# Patient Record
Sex: Female | Born: 1975 | Hispanic: Yes | Marital: Married | Smoking: Never smoker
Health system: Southern US, Community
[De-identification: ages and names within clinical notes are randomized; demographics above are authoritative.]

## PROBLEM LIST (undated history)

## (undated) DIAGNOSIS — M1990 Unspecified osteoarthritis, unspecified site: Secondary | ICD-10-CM

## (undated) DIAGNOSIS — Z8709 Personal history of other diseases of the respiratory system: Secondary | ICD-10-CM

## (undated) DIAGNOSIS — D649 Anemia, unspecified: Secondary | ICD-10-CM

## (undated) DIAGNOSIS — I10 Essential (primary) hypertension: Secondary | ICD-10-CM

## (undated) DIAGNOSIS — E119 Type 2 diabetes mellitus without complications: Secondary | ICD-10-CM

## (undated) DIAGNOSIS — F32A Depression, unspecified: Secondary | ICD-10-CM

## (undated) DIAGNOSIS — J189 Pneumonia, unspecified organism: Secondary | ICD-10-CM

## (undated) DIAGNOSIS — M7542 Impingement syndrome of left shoulder: Secondary | ICD-10-CM

## (undated) DIAGNOSIS — E785 Hyperlipidemia, unspecified: Secondary | ICD-10-CM

## (undated) DIAGNOSIS — N809 Endometriosis, unspecified: Secondary | ICD-10-CM

## (undated) DIAGNOSIS — G5600 Carpal tunnel syndrome, unspecified upper limb: Secondary | ICD-10-CM

## (undated) DIAGNOSIS — F419 Anxiety disorder, unspecified: Secondary | ICD-10-CM

## (undated) DIAGNOSIS — R112 Nausea with vomiting, unspecified: Secondary | ICD-10-CM

## (undated) DIAGNOSIS — R0600 Dyspnea, unspecified: Secondary | ICD-10-CM

## (undated) DIAGNOSIS — G43909 Migraine, unspecified, not intractable, without status migrainosus: Secondary | ICD-10-CM

## (undated) DIAGNOSIS — J45909 Unspecified asthma, uncomplicated: Secondary | ICD-10-CM

## (undated) DIAGNOSIS — Z9889 Other specified postprocedural states: Secondary | ICD-10-CM

## (undated) DIAGNOSIS — Z9289 Personal history of other medical treatment: Secondary | ICD-10-CM

## (undated) HISTORY — DX: Anxiety disorder, unspecified: F41.9

## (undated) HISTORY — DX: Depression, unspecified: F32.A

## (undated) HISTORY — PX: APPENDECTOMY: SHX54

## (undated) HISTORY — DX: Type 2 diabetes mellitus without complications: E11.9

## (undated) HISTORY — DX: Hyperlipidemia, unspecified: E78.5

## (undated) HISTORY — DX: Carpal tunnel syndrome, unspecified upper limb: G56.00

## (undated) HISTORY — DX: Unspecified osteoarthritis, unspecified site: M19.90

## (undated) HISTORY — PX: OTHER SURGICAL HISTORY: SHX169

## (undated) HISTORY — DX: Anemia, unspecified: D64.9

## (undated) HISTORY — PX: TONSILLECTOMY: SUR1361

## (undated) HISTORY — DX: Essential (primary) hypertension: I10

## (undated) HISTORY — DX: Impingement syndrome of left shoulder: M75.42

---

## 1997-06-08 DIAGNOSIS — Z9289 Personal history of other medical treatment: Secondary | ICD-10-CM

## 1997-06-08 HISTORY — DX: Personal history of other medical treatment: Z92.89

## 1997-07-19 ENCOUNTER — Ambulatory Visit (HOSPITAL_COMMUNITY): Admission: RE | Admit: 1997-07-19 | Discharge: 1997-07-19 | Payer: Self-pay | Admitting: Obstetrics and Gynecology

## 1997-09-04 ENCOUNTER — Ambulatory Visit (HOSPITAL_COMMUNITY): Admission: RE | Admit: 1997-09-04 | Discharge: 1997-09-04 | Payer: Self-pay | Admitting: Obstetrics and Gynecology

## 1997-10-29 ENCOUNTER — Other Ambulatory Visit: Admission: RE | Admit: 1997-10-29 | Discharge: 1997-10-29 | Payer: Self-pay | Admitting: Internal Medicine

## 1997-12-03 ENCOUNTER — Inpatient Hospital Stay (HOSPITAL_COMMUNITY): Admission: AD | Admit: 1997-12-03 | Discharge: 1997-12-03 | Payer: Self-pay | Admitting: Obstetrics & Gynecology

## 1997-12-05 ENCOUNTER — Inpatient Hospital Stay (HOSPITAL_COMMUNITY): Admission: AD | Admit: 1997-12-05 | Discharge: 1997-12-05 | Payer: Self-pay | Admitting: Obstetrics

## 1997-12-20 ENCOUNTER — Other Ambulatory Visit: Admission: RE | Admit: 1997-12-20 | Discharge: 1997-12-20 | Payer: Self-pay | Admitting: *Deleted

## 1998-01-24 ENCOUNTER — Inpatient Hospital Stay (HOSPITAL_COMMUNITY): Admission: AD | Admit: 1998-01-24 | Discharge: 1998-01-24 | Payer: Self-pay | Admitting: Obstetrics and Gynecology

## 1998-01-26 ENCOUNTER — Inpatient Hospital Stay (HOSPITAL_COMMUNITY): Admission: AD | Admit: 1998-01-26 | Discharge: 1998-01-31 | Payer: Self-pay | Admitting: Obstetrics

## 1998-01-28 ENCOUNTER — Encounter: Payer: Self-pay | Admitting: Obstetrics

## 1998-02-26 ENCOUNTER — Encounter: Admission: RE | Admit: 1998-02-26 | Discharge: 1998-02-26 | Payer: Self-pay | Admitting: Family Medicine

## 1998-03-08 ENCOUNTER — Encounter: Admission: RE | Admit: 1998-03-08 | Discharge: 1998-03-08 | Payer: Self-pay | Admitting: Family Medicine

## 1998-11-25 ENCOUNTER — Emergency Department (HOSPITAL_COMMUNITY): Admission: EM | Admit: 1998-11-25 | Discharge: 1998-11-25 | Payer: Self-pay | Admitting: Emergency Medicine

## 2001-12-04 ENCOUNTER — Emergency Department (HOSPITAL_COMMUNITY): Admission: EM | Admit: 2001-12-04 | Discharge: 2001-12-04 | Payer: Self-pay | Admitting: Emergency Medicine

## 2003-07-11 ENCOUNTER — Emergency Department (HOSPITAL_COMMUNITY): Admission: EM | Admit: 2003-07-11 | Discharge: 2003-07-11 | Payer: Self-pay | Admitting: Emergency Medicine

## 2003-08-23 ENCOUNTER — Emergency Department (HOSPITAL_COMMUNITY): Admission: EM | Admit: 2003-08-23 | Discharge: 2003-08-24 | Payer: Self-pay | Admitting: Emergency Medicine

## 2003-08-23 IMAGING — CR DG CHEST 2V
2 series · 2 of 2 positions shown · non-contrast
Comparison: none

CLINICAL DATA: Chest pain.
 CHEST, TWO VIEWS 
 Two views of the chest without prior studies for comparison demonstrate cardiac silhouette, mediastinal and hilar contours to be within normal limits.  There is peribronchial thickening and increased interstitial markings which may reflect bronchitis.  No focal infiltrates.  Bony structures are intact. 
 IMPRESSION
 Findings suggest bronchitis.  No focal infiltrates.

[view not recorded (1 of 2)]
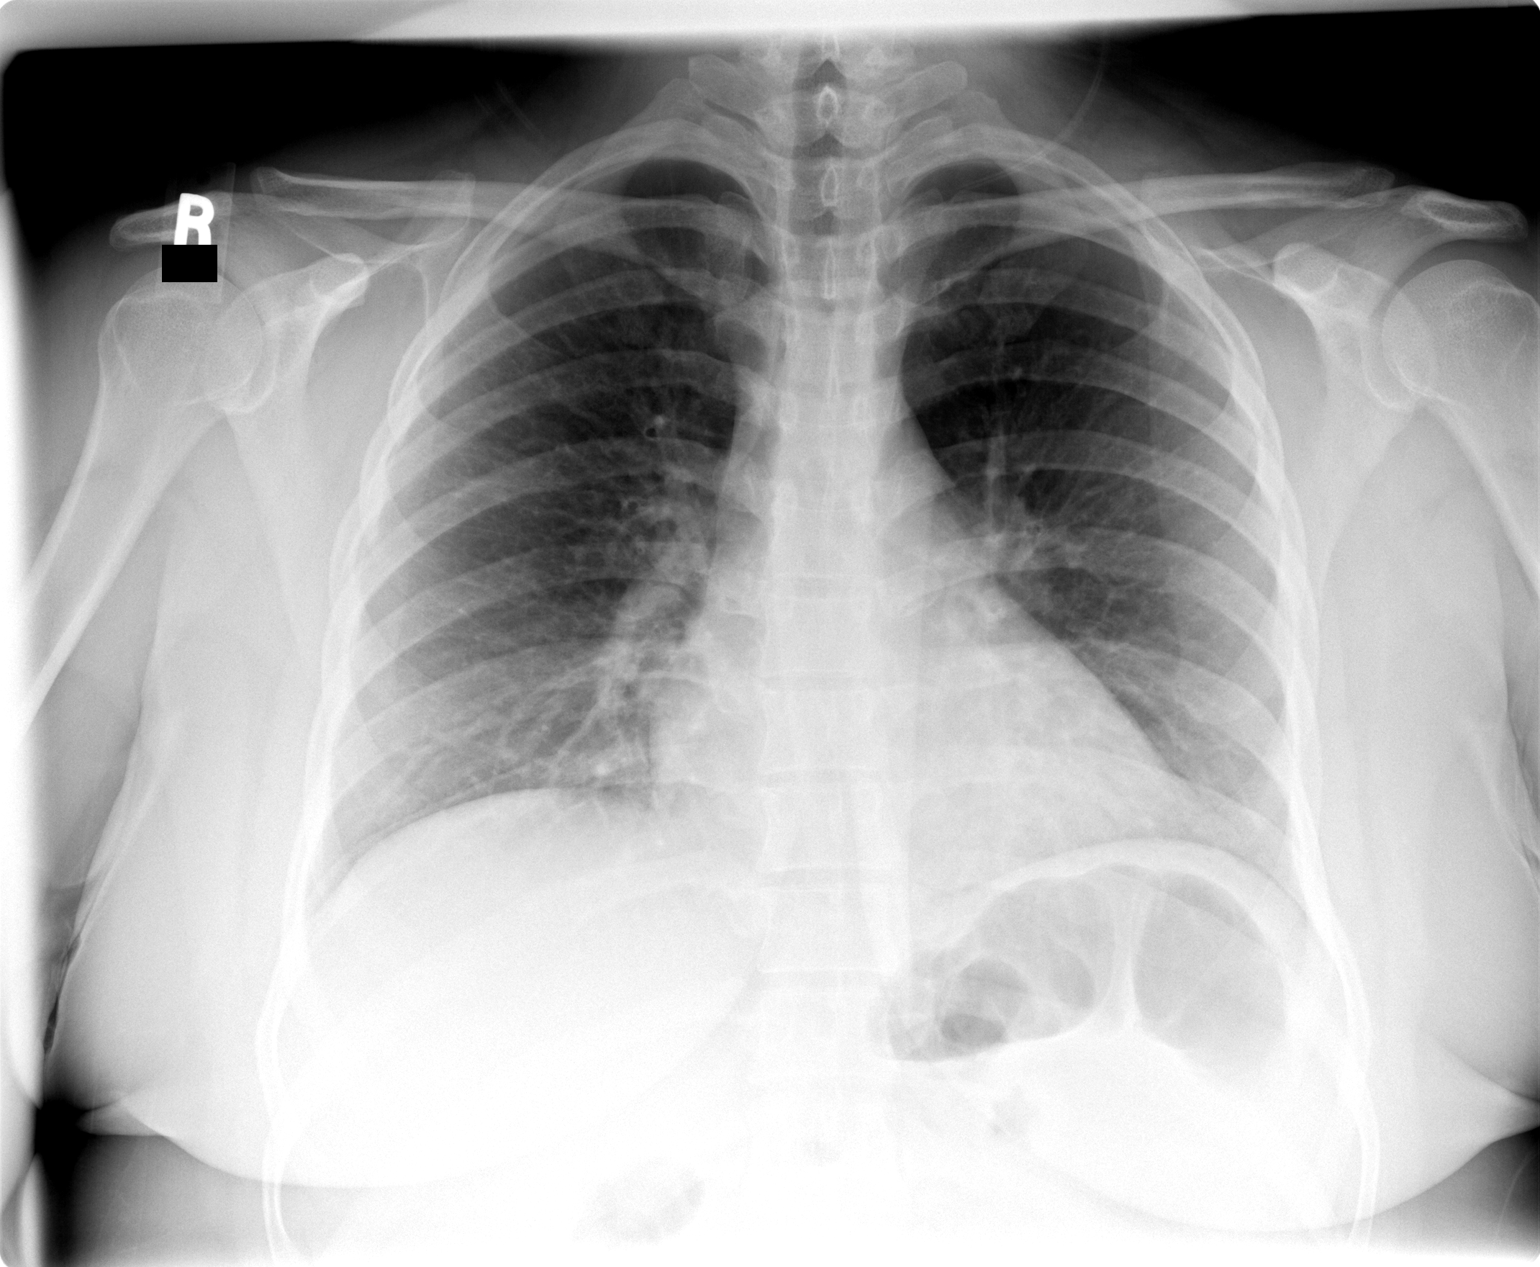

[view not recorded (2 of 2)]
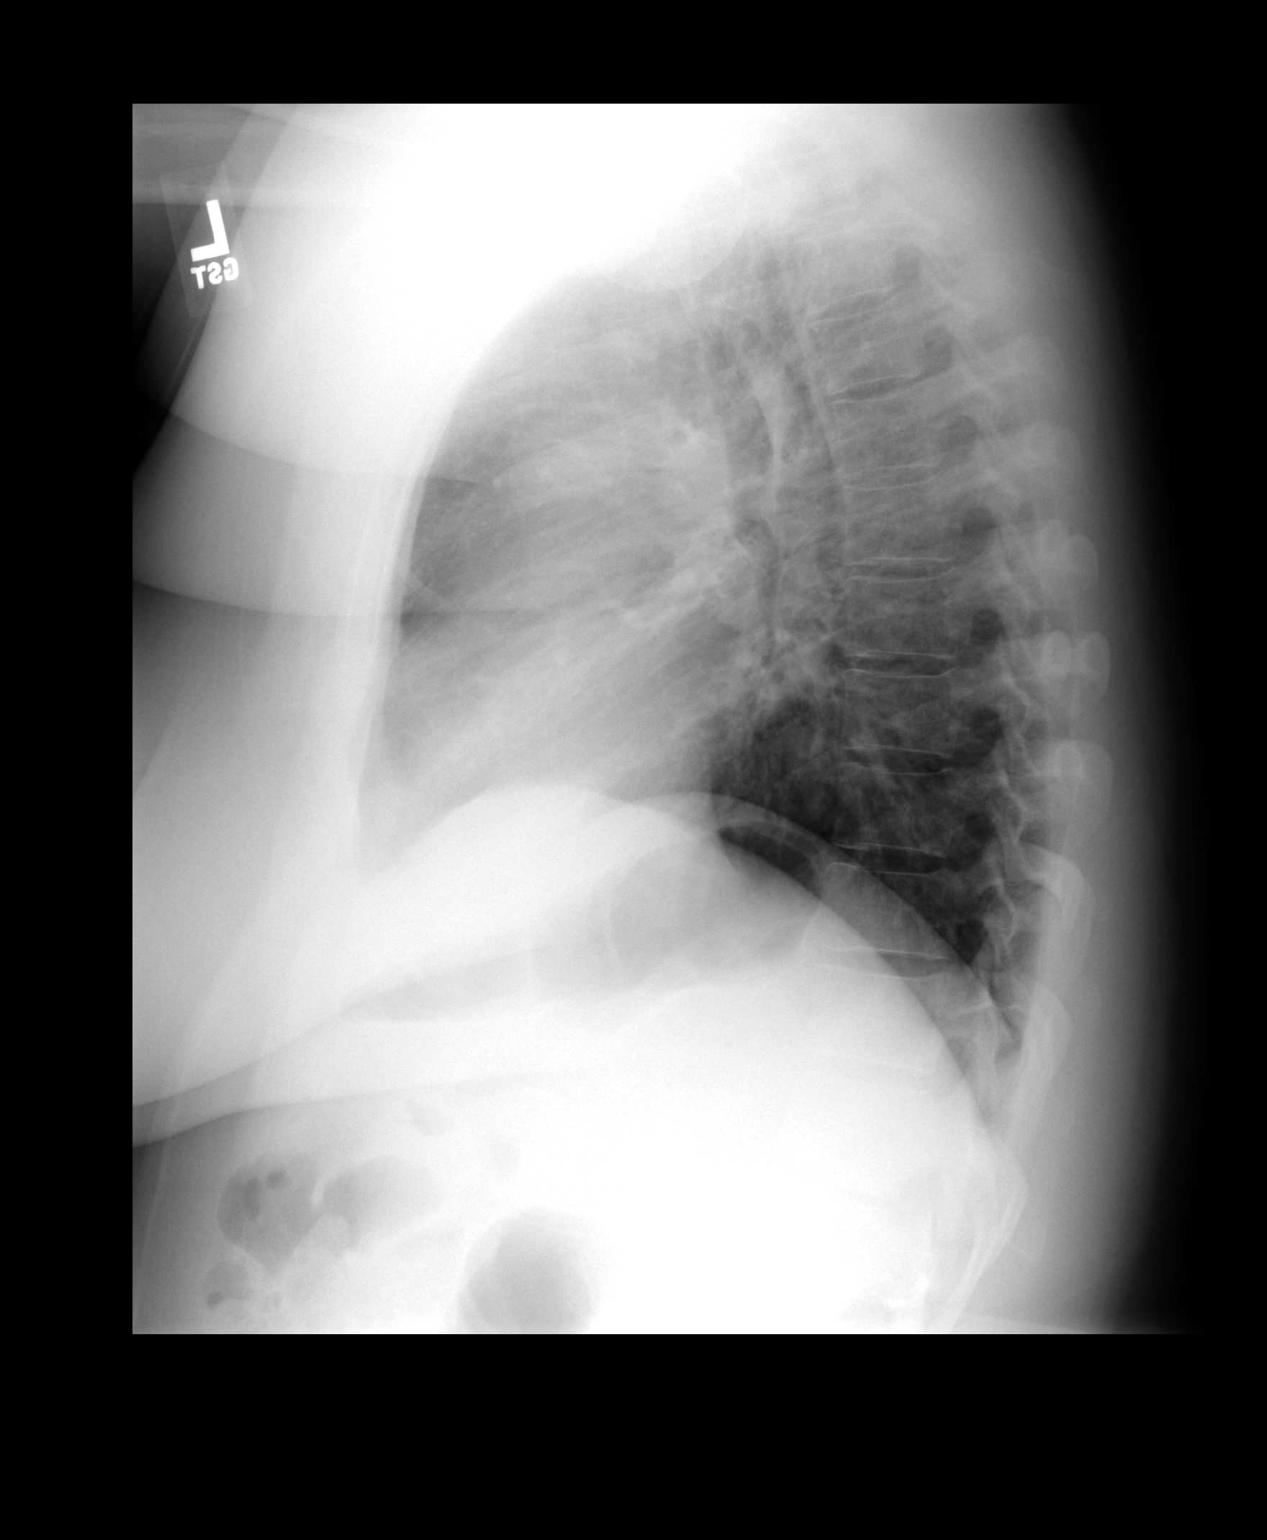

[2 of 2 positions shown; findings below may reference images not displayed]

## 2004-07-17 ENCOUNTER — Ambulatory Visit: Payer: Self-pay | Admitting: Family Medicine

## 2004-08-23 IMAGING — CR DG HAND COMPLETE 3+V*R*
3 series · 3 of 3 positions shown · non-contrast
Comparison: none

CLINICAL DATA: Status post fall three weeks ago; now with pain in fifth digit of right hand.
 RIGHT HAND ? 3 VIEWS:

[x hand pa right]
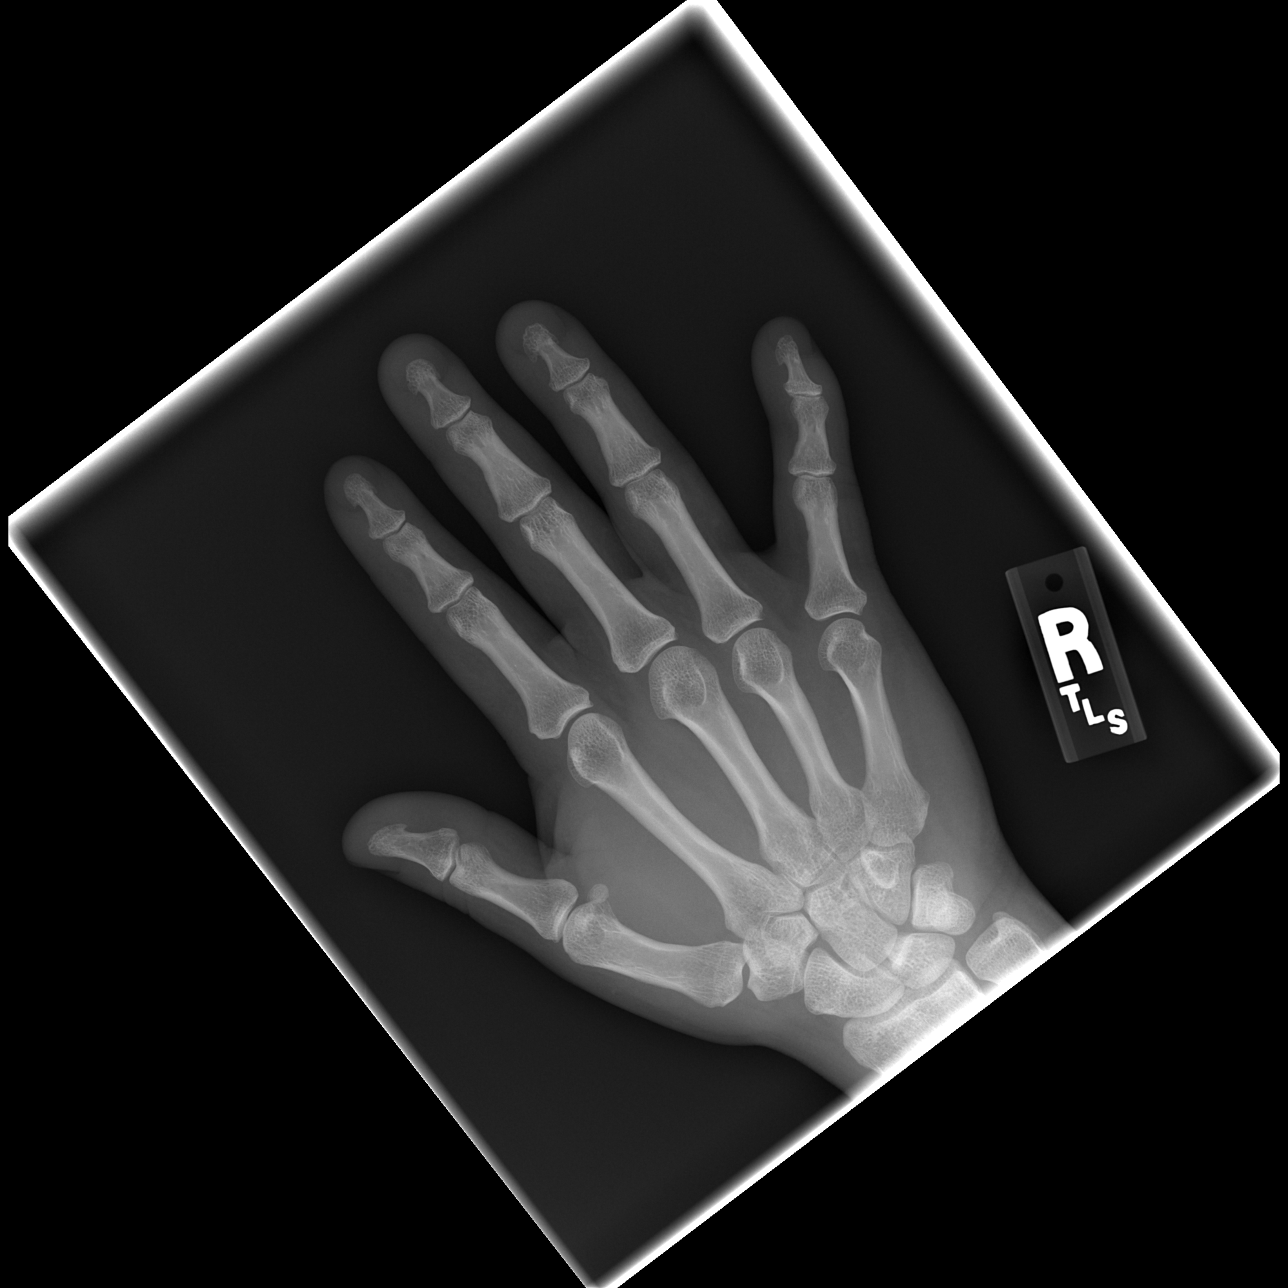

[x hand oblique right]
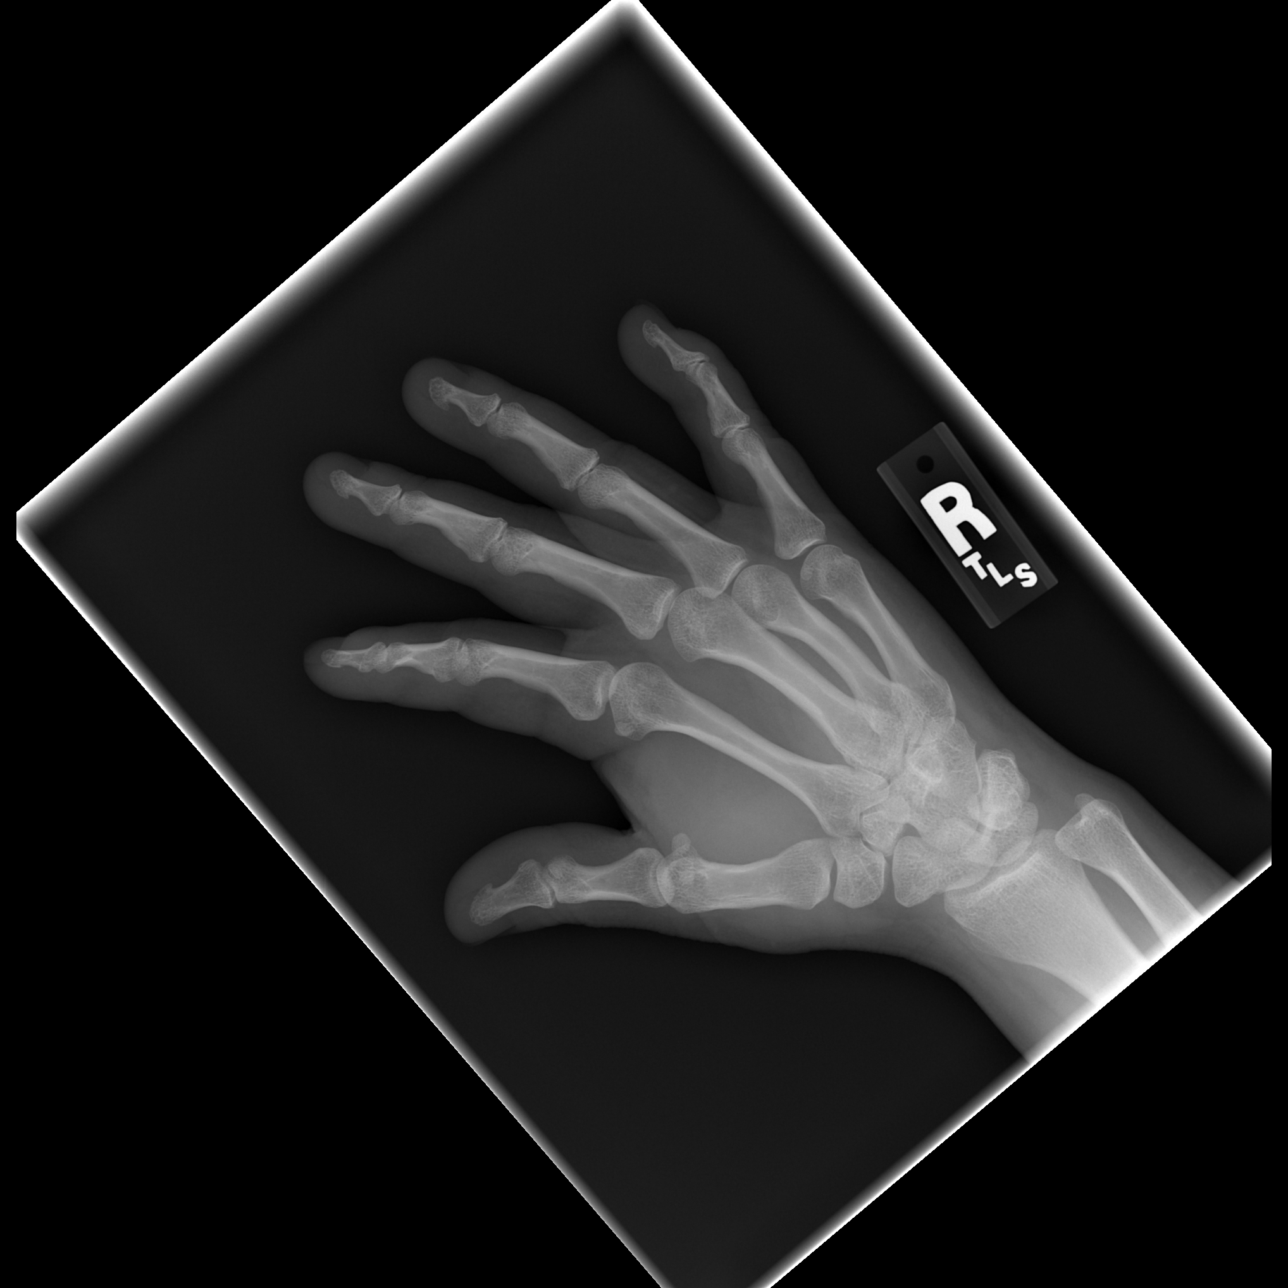

[x hand lat right]
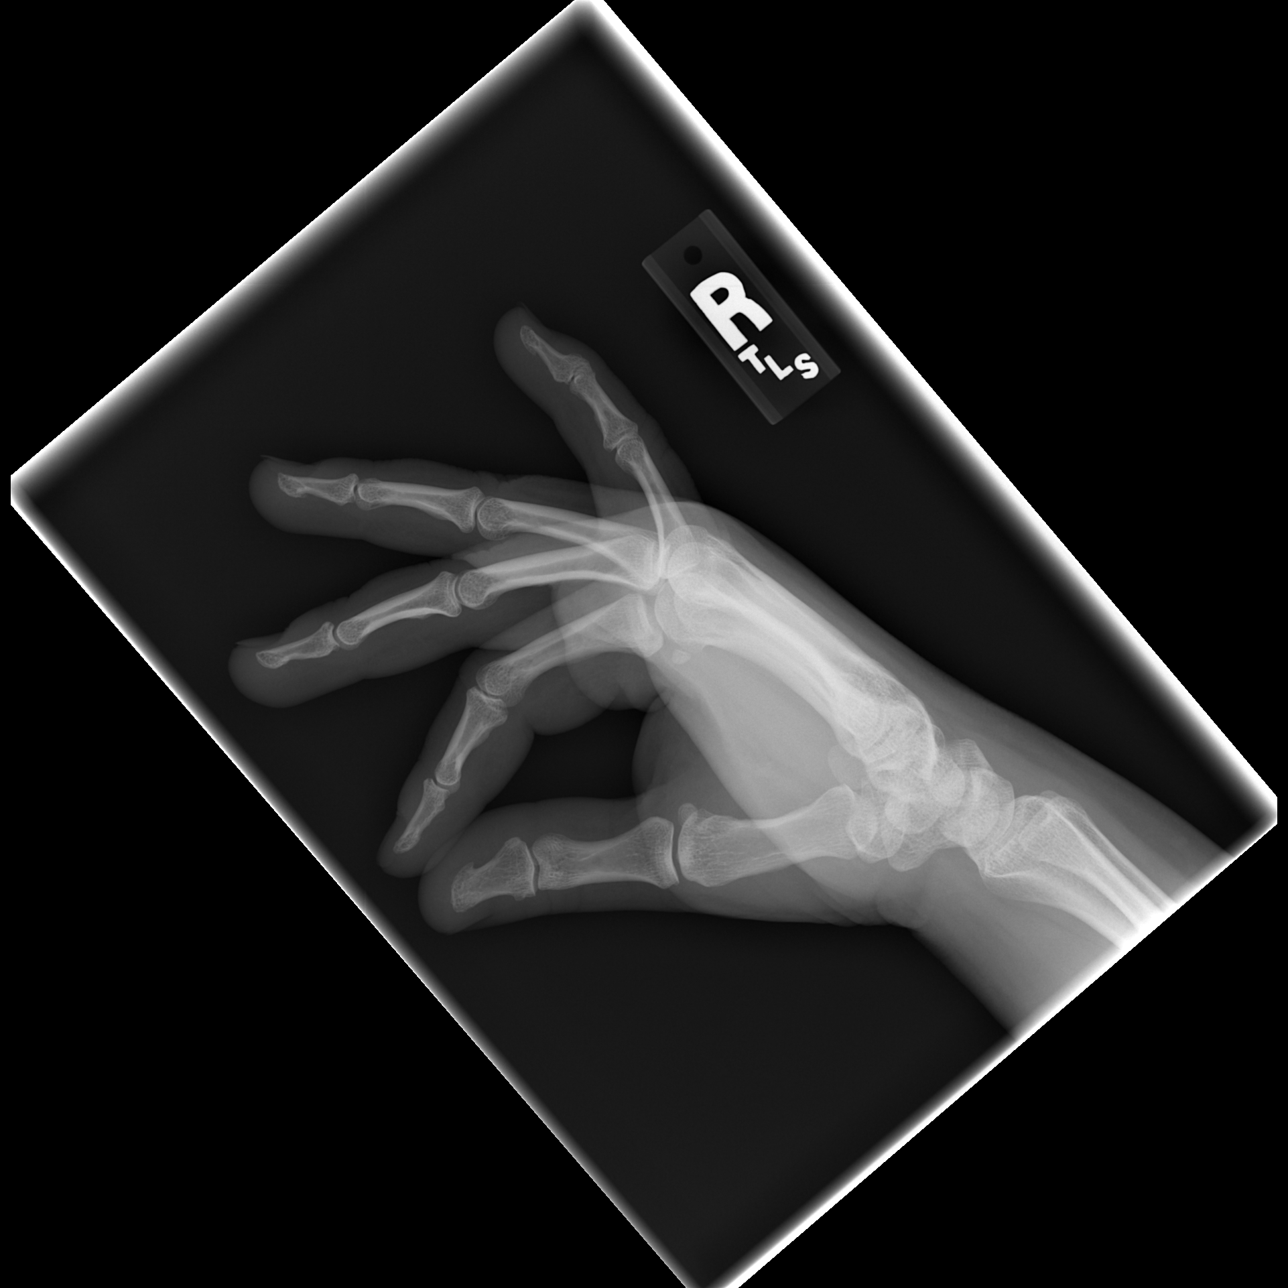

[3 of 3 positions shown; findings below may reference images not displayed]

FINDINGS: No acute radiographic abnormalities are noted.  Specifically, I see no fractures or dislocations.
IMPRESSION: No acute findings.

## 2004-09-11 ENCOUNTER — Encounter (INDEPENDENT_AMBULATORY_CARE_PROVIDER_SITE_OTHER): Payer: Self-pay | Admitting: *Deleted

## 2004-09-11 LAB — CONVERTED CEMR LAB

## 2004-09-16 ENCOUNTER — Ambulatory Visit: Payer: Self-pay | Admitting: Family Medicine

## 2004-10-24 ENCOUNTER — Ambulatory Visit: Payer: Self-pay | Admitting: Family Medicine

## 2004-10-28 ENCOUNTER — Ambulatory Visit: Payer: Self-pay | Admitting: Family Medicine

## 2004-10-30 ENCOUNTER — Ambulatory Visit: Payer: Self-pay | Admitting: Family Medicine

## 2004-11-10 ENCOUNTER — Ambulatory Visit: Payer: Self-pay | Admitting: Family Medicine

## 2004-11-18 ENCOUNTER — Ambulatory Visit: Payer: Self-pay | Admitting: Sports Medicine

## 2004-12-01 ENCOUNTER — Encounter: Admission: RE | Admit: 2004-12-01 | Discharge: 2005-03-01 | Payer: Self-pay | Admitting: Family Medicine

## 2004-12-05 ENCOUNTER — Ambulatory Visit: Payer: Self-pay | Admitting: *Deleted

## 2004-12-30 ENCOUNTER — Ambulatory Visit: Payer: Self-pay | Admitting: Family Medicine

## 2005-01-13 ENCOUNTER — Ambulatory Visit (HOSPITAL_COMMUNITY): Admission: RE | Admit: 2005-01-13 | Discharge: 2005-01-13 | Payer: Self-pay | Admitting: Family Medicine

## 2005-01-22 ENCOUNTER — Ambulatory Visit: Payer: Self-pay | Admitting: Family Medicine

## 2005-02-05 ENCOUNTER — Ambulatory Visit: Payer: Self-pay | Admitting: Family Medicine

## 2005-03-26 ENCOUNTER — Encounter: Admission: RE | Admit: 2005-03-26 | Discharge: 2005-03-26 | Payer: Self-pay | Admitting: Sports Medicine

## 2005-03-26 IMAGING — CR DG CHEST 2V
3 series · 3 of 3 positions shown · non-contrast
Comparison: none

CLINICAL DATA: Cough, congestion, fever.
 CHEST X-RAY:
 Two views of the chest are compared to a chest x-ray of [DATE].  No pneumonia is seen, however, there are prominent perihilar markings with peribronchial thickening consistent with bronchitis.  The heart is within upper limits of normal.

[view not recorded (1 of 3)]
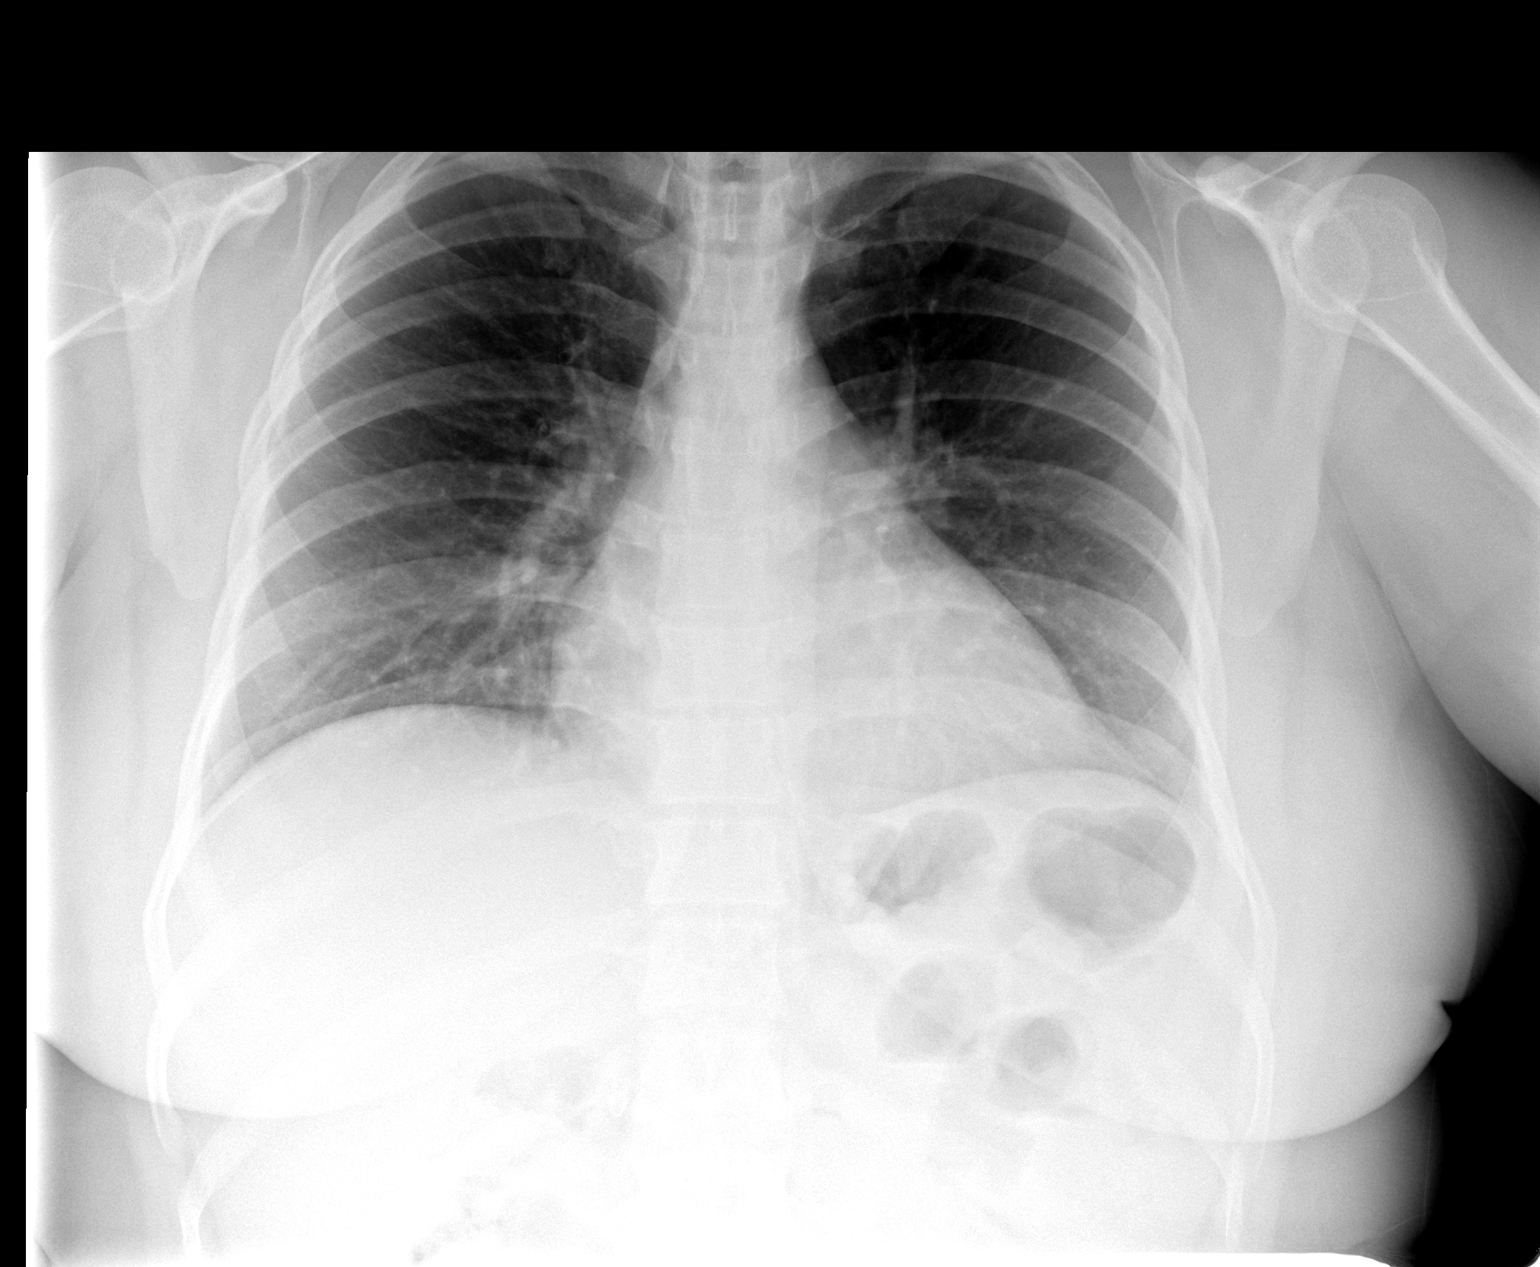

[view not recorded (2 of 3)]
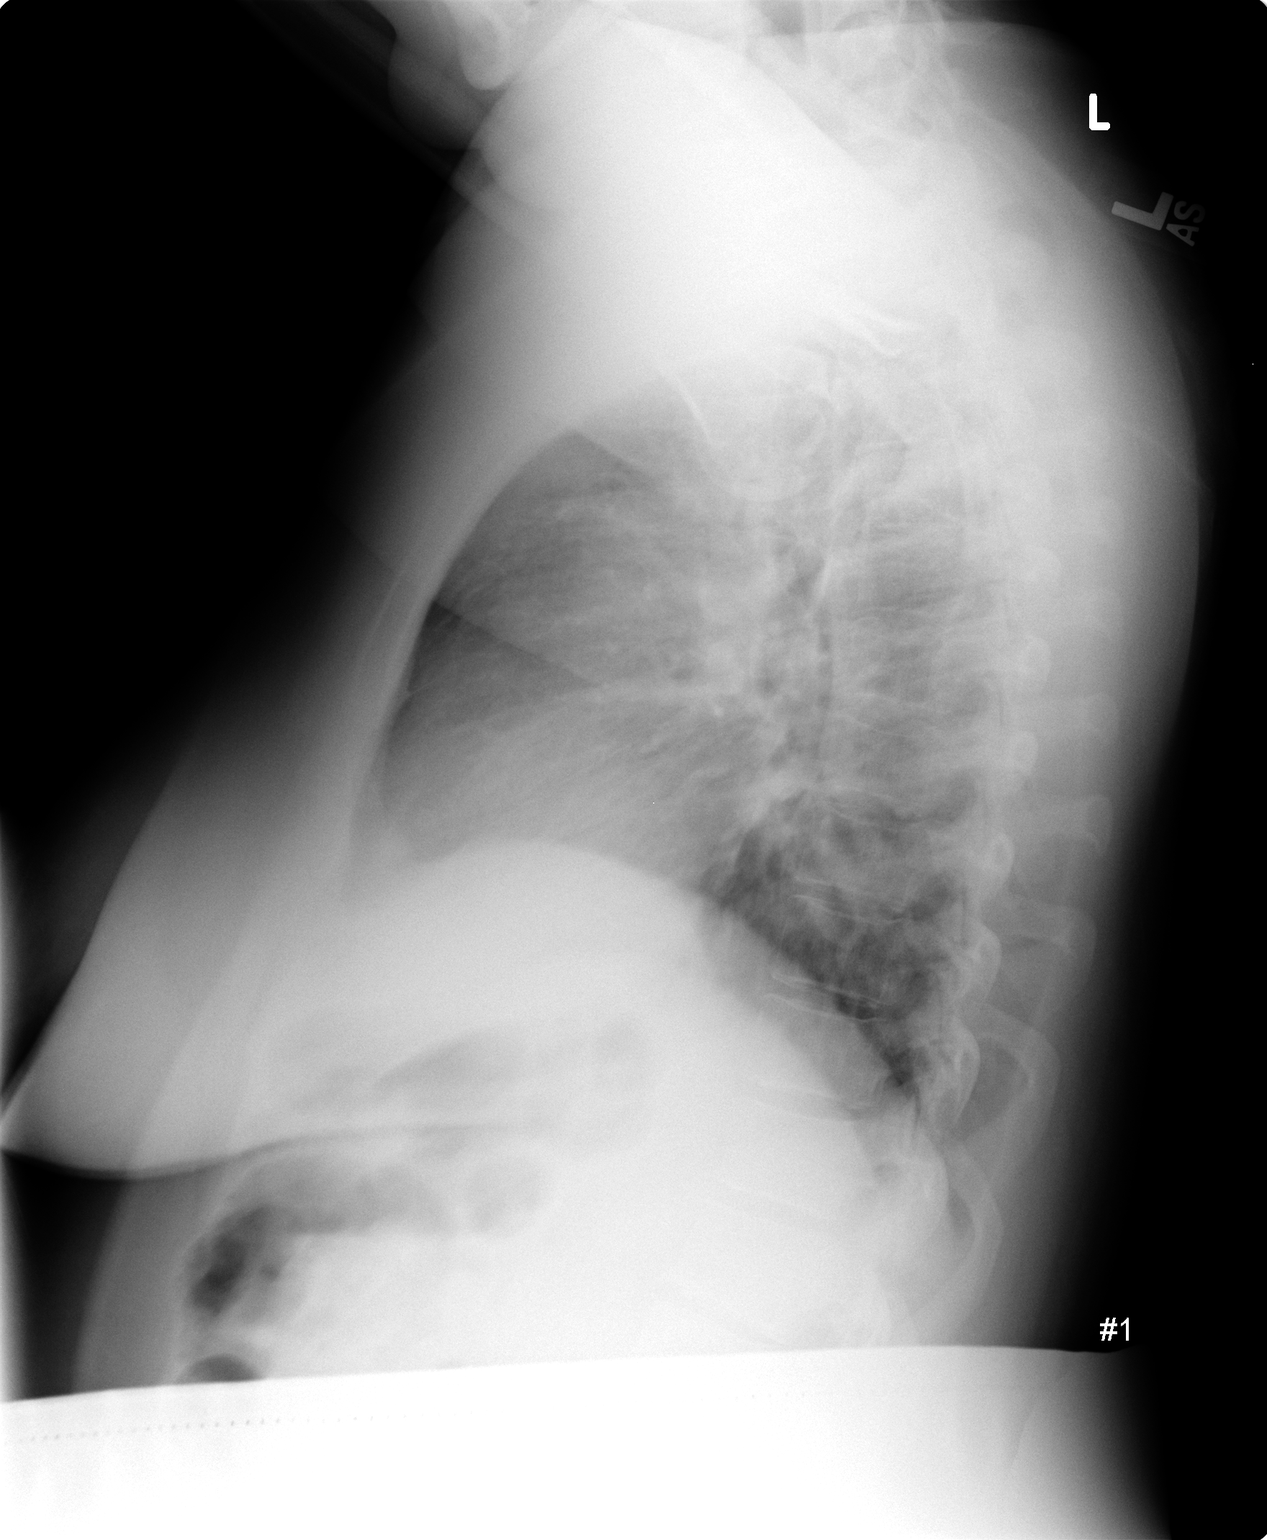

[view not recorded (3 of 3)]
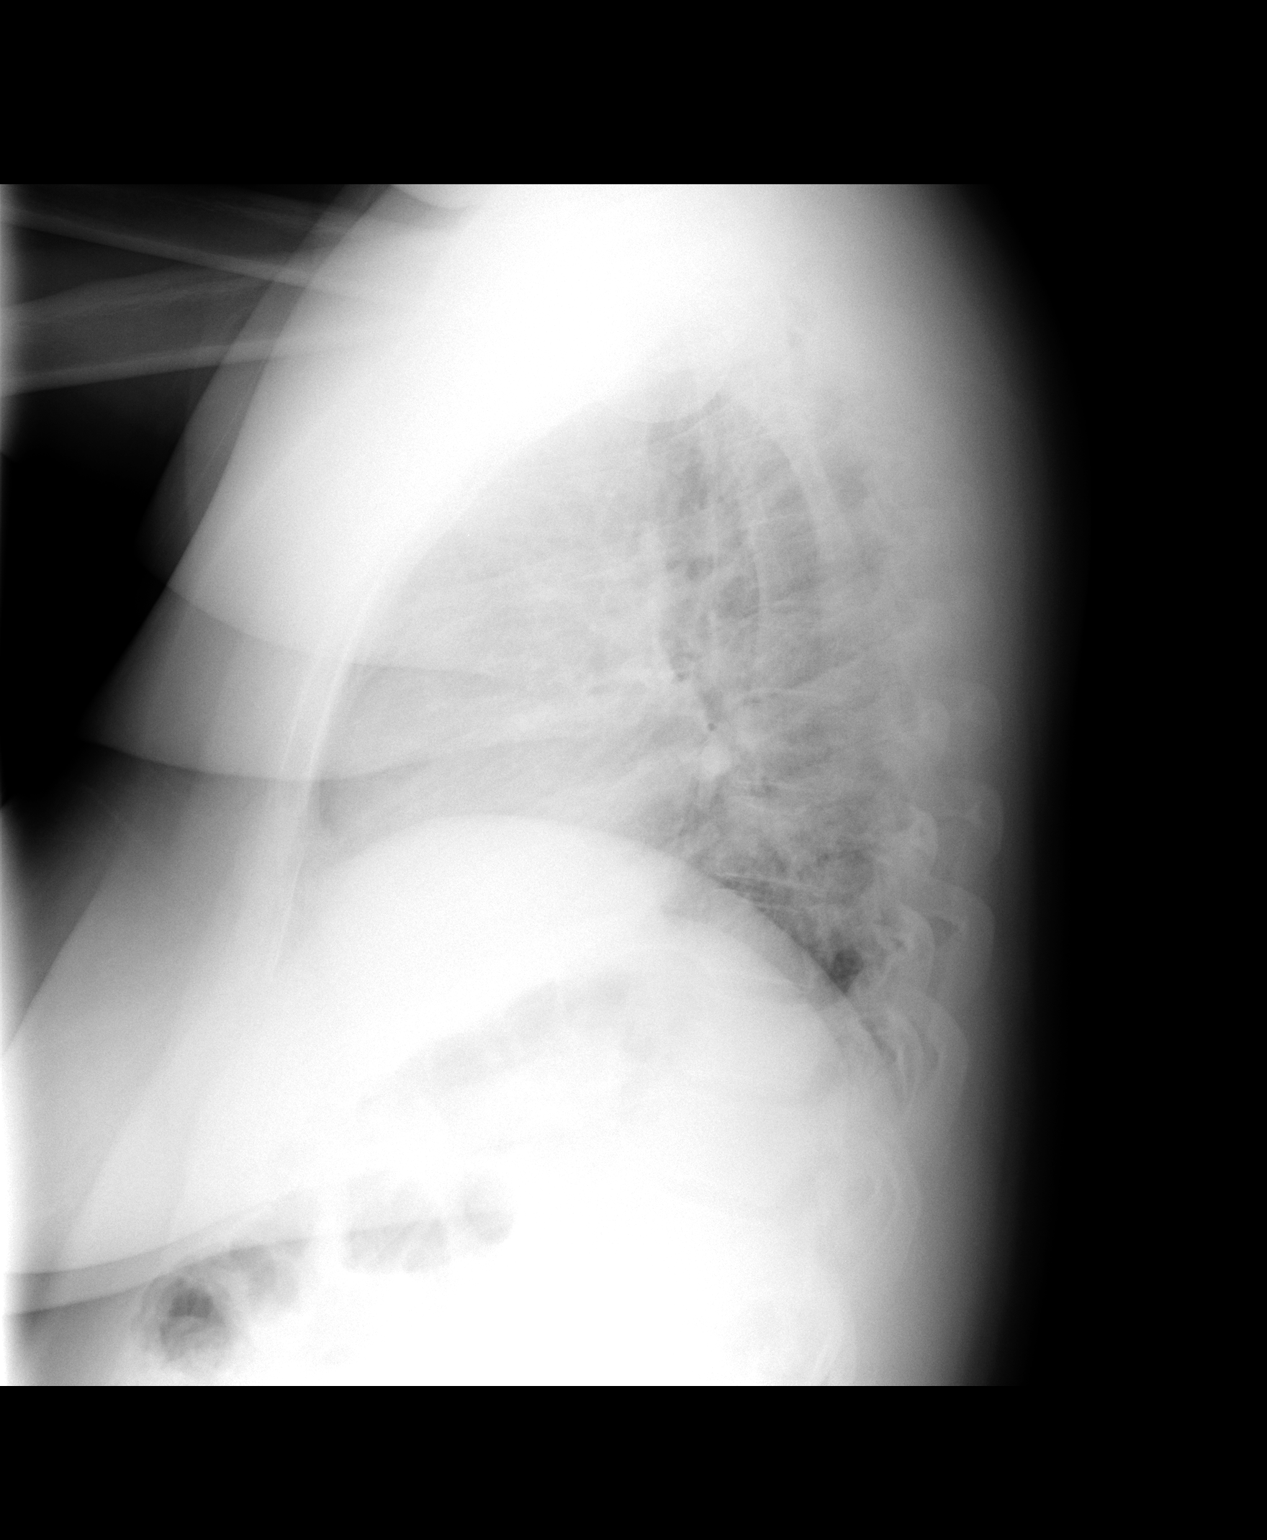

[3 of 3 positions shown; findings below may reference images not displayed]

IMPRESSION: Bronchitis.  No pneumonia.

## 2005-04-10 ENCOUNTER — Ambulatory Visit: Payer: Self-pay | Admitting: Family Medicine

## 2005-04-16 ENCOUNTER — Ambulatory Visit: Payer: Self-pay | Admitting: Family Medicine

## 2005-04-22 ENCOUNTER — Ambulatory Visit: Payer: Self-pay | Admitting: Family Medicine

## 2005-07-16 ENCOUNTER — Ambulatory Visit (HOSPITAL_COMMUNITY): Admission: RE | Admit: 2005-07-16 | Discharge: 2005-07-16 | Payer: Self-pay | Admitting: Family Medicine

## 2005-07-16 ENCOUNTER — Ambulatory Visit: Payer: Self-pay | Admitting: Family Medicine

## 2005-08-05 ENCOUNTER — Ambulatory Visit: Payer: Self-pay | Admitting: Sports Medicine

## 2005-08-19 ENCOUNTER — Ambulatory Visit: Payer: Self-pay | Admitting: Family Medicine

## 2005-08-26 ENCOUNTER — Ambulatory Visit: Payer: Self-pay | Admitting: Sports Medicine

## 2005-09-29 ENCOUNTER — Ambulatory Visit: Payer: Self-pay | Admitting: Family Medicine

## 2005-10-07 ENCOUNTER — Encounter: Payer: Self-pay | Admitting: Obstetrics

## 2005-10-23 ENCOUNTER — Ambulatory Visit: Payer: Self-pay | Admitting: Family Medicine

## 2005-12-21 ENCOUNTER — Ambulatory Visit: Payer: Self-pay | Admitting: Family Medicine

## 2006-01-27 ENCOUNTER — Emergency Department (HOSPITAL_COMMUNITY): Admission: EM | Admit: 2006-01-27 | Discharge: 2006-01-28 | Payer: Self-pay | Admitting: Emergency Medicine

## 2006-01-28 ENCOUNTER — Ambulatory Visit: Payer: Self-pay | Admitting: Family Medicine

## 2006-08-06 ENCOUNTER — Encounter (INDEPENDENT_AMBULATORY_CARE_PROVIDER_SITE_OTHER): Payer: Self-pay | Admitting: *Deleted

## 2006-08-24 ENCOUNTER — Telehealth: Payer: Self-pay | Admitting: *Deleted

## 2006-08-24 ENCOUNTER — Ambulatory Visit: Payer: Self-pay | Admitting: Family Medicine

## 2006-09-10 ENCOUNTER — Ambulatory Visit: Payer: Self-pay | Admitting: Family Medicine

## 2006-09-10 LAB — CONVERTED CEMR LAB: Beta hcg, urine, semiquantitative: POSITIVE

## 2006-09-12 ENCOUNTER — Inpatient Hospital Stay (HOSPITAL_COMMUNITY): Admission: AD | Admit: 2006-09-12 | Discharge: 2006-09-13 | Payer: Self-pay | Admitting: Family Medicine

## 2006-09-13 IMAGING — US US ABDOMEN COMPLETE
1 series · 13 of 25 positions shown · non-contrast
Comparison: None.

CLINICAL DATA: Right upper quadrant pain.  5 weeks pregnant.
ABDOMEN ULTRASOUND:
TECHNIQUE: Complete abdominal ultrasound examination was performed including evaluation of the liver, gallbladder, bile ducts, pancreas, kidneys, spleen, IVC, and abdominal aorta.
TECHNIQUE: Both transabdominal and transvaginal ultrasound examinations were performed for complete evaluation of the gestation as well as the maternal uterus, adnexal regions, and pelvic cul-de-sac.

[Series 1: us abdomen complete · 0.33mm/px · 13 of 42 slices shown]
[im 1/42]
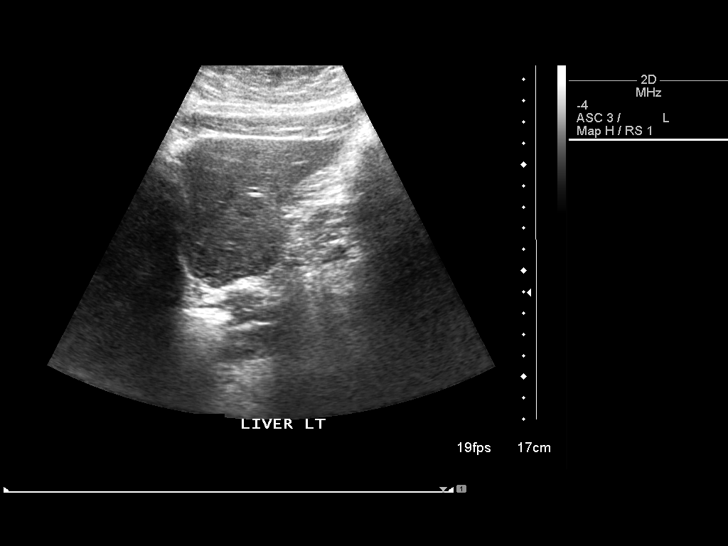
[im 4/42]
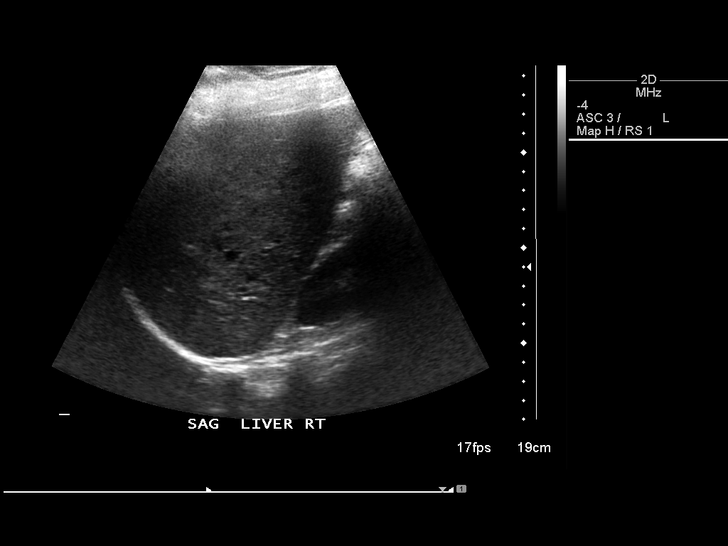
[im 7/42]
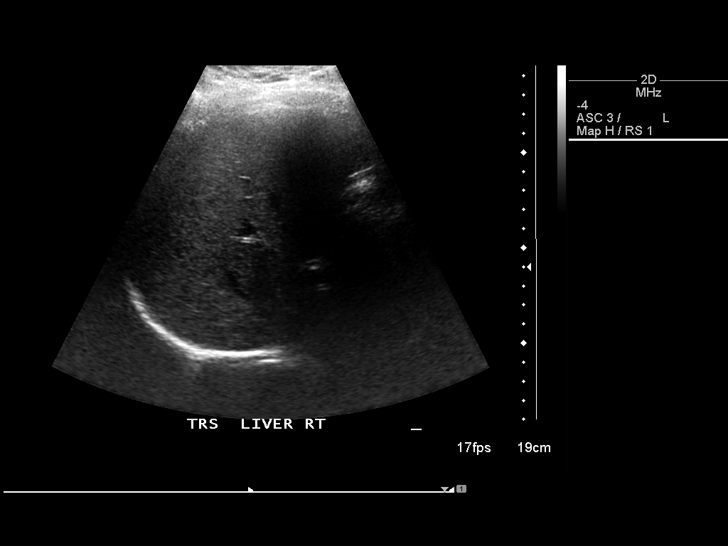
[im 11/42]
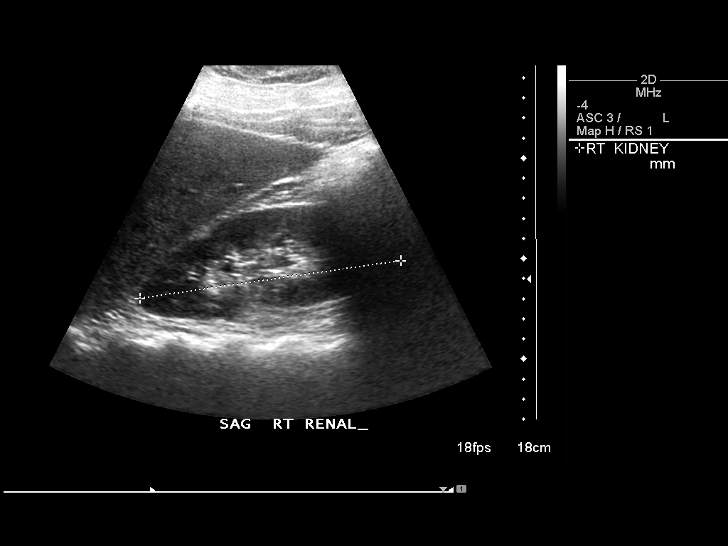
[im 14/42]
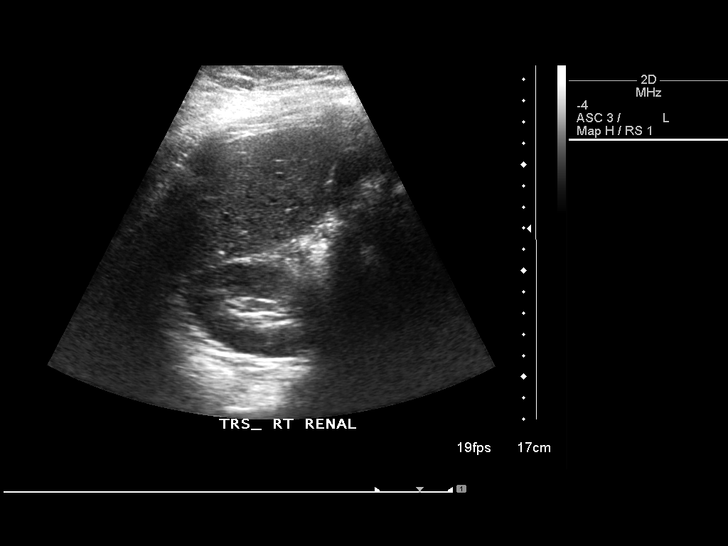
[im 18/42]
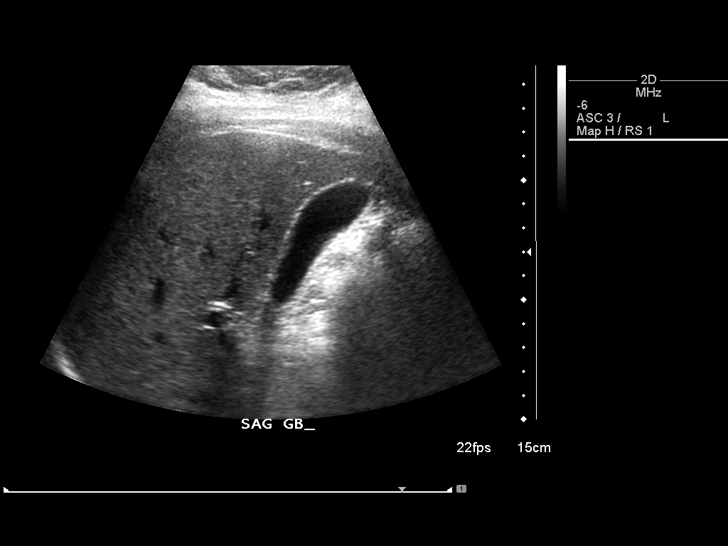
[im 21/42]
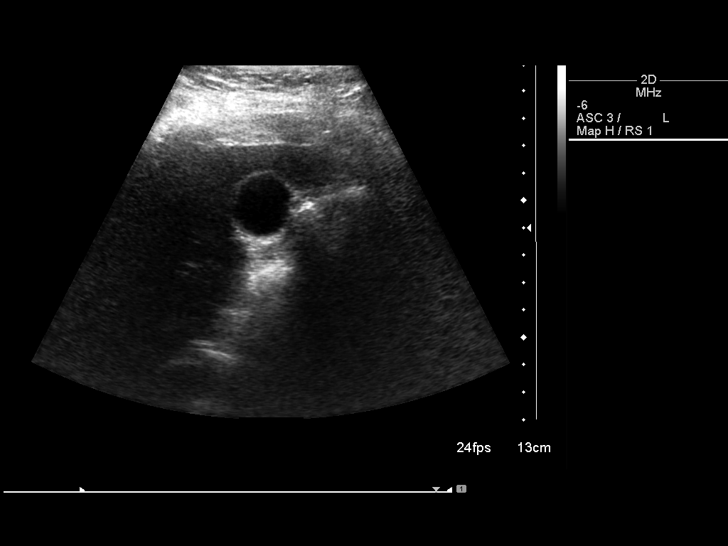
[im 24/42]
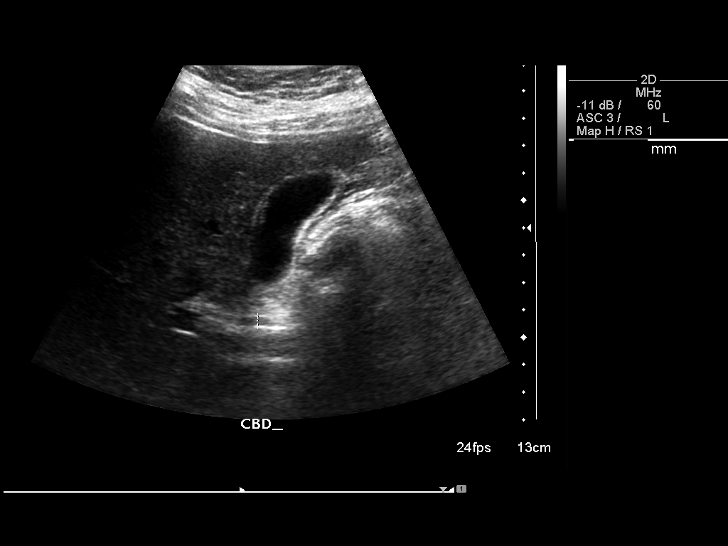
[im 28/42]
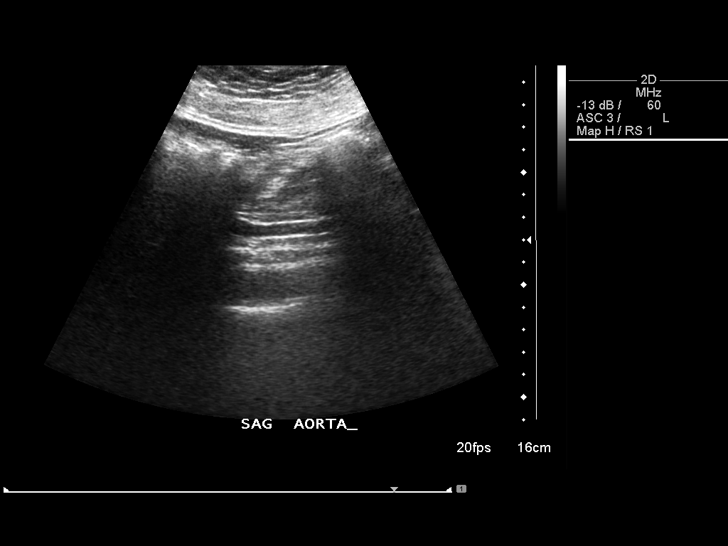
[im 31/42]
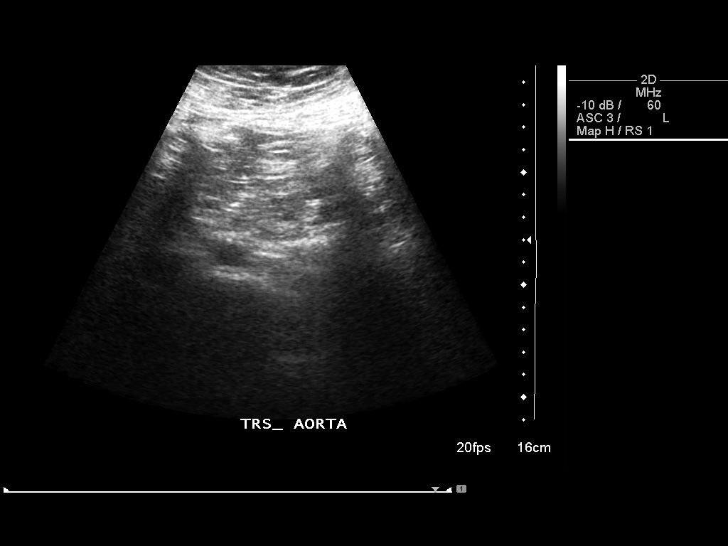
[im 35/42]
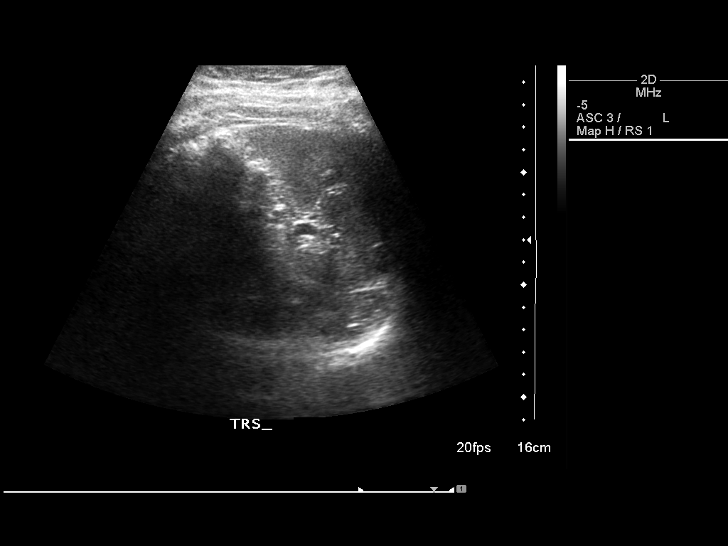
[im 38/42]
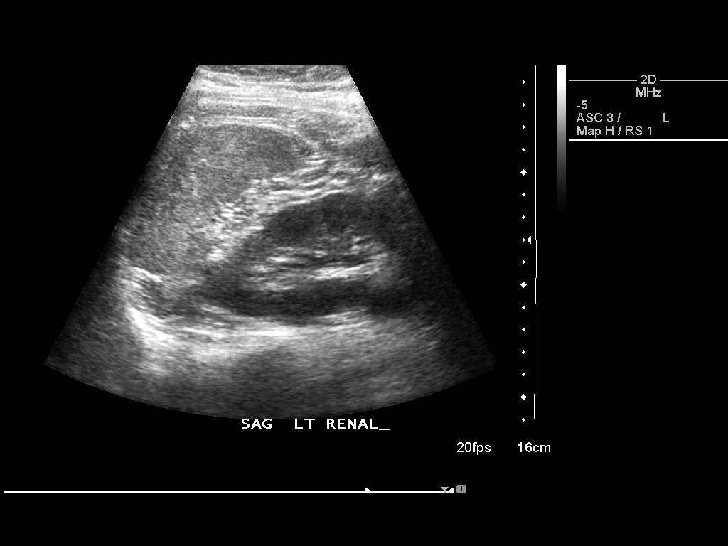
[im 42/42]
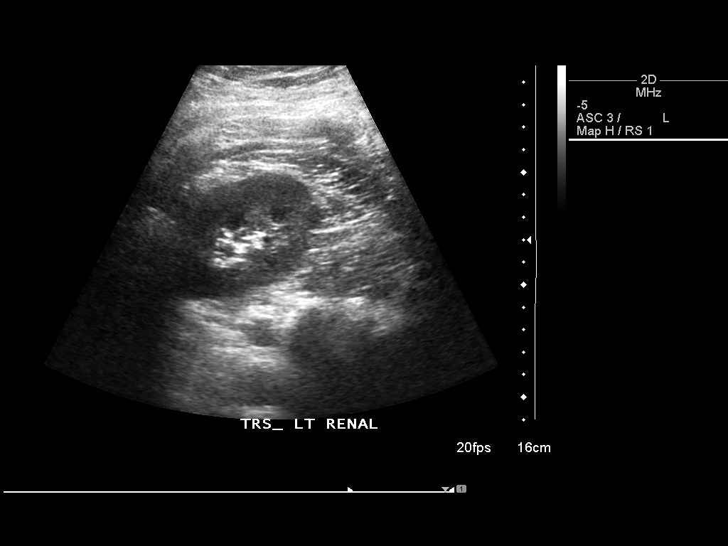

[13 of 25 positions shown; findings below may reference images not displayed]

FINDINGS: Gallbladder:  Normal.
Common bile duct:  Nondilated at 4 mm in diameter.  
Liver:  Normal.
IVC:  Normal.
Pancreas:  Obscured by overlying bowel gas.  
Spleen:  Normal.
Right kidney:  13.1 cm in long axis.  Normal echogenic appearance.  
Left kidney:  12.0 cm in long axis.  Normal appearance.  
Abdominal aorta:  No aneurysm.
IMPRESSION: Pancreas is obscured by overlying bowel gas.  Abdominal ultrasound is otherwise unremarkable.  
OBSTETRICAL ULTRASOUND <14 WKS AND TRANSVAGINAL OB US:
FINDINGS: There is a tiny collection of fluid in the uterus, which appears to be within the decidualized endometrium rather than in the endometrial canal proper.  This would be most suggestive of an intrauterine gestational sac although no yolk sac or embryo is yet apparent.  Mean sac diameter is 4 mm, which would estimate a 5 week 1 day gestational age.  
The right ovary is sonographically normal.  The left ovary contains a 2.7 cm complex cystic lesion.  There is no evidence for free fluid in the cul-de-sac.  No adnexal masses.
IMPRESSION: 1.  Probable intrauterine gestational sac although no embryo or yolk sac is yet apparent.  Quantitative beta hCG by report is 862.  Early IUP and nonvisualized ectopic pregnancy are both considerations.  Close clinical follow-up is recommended with serial beta hCG and follow-up ultrasound as warranted.  
2.  Probable hemorrhagic cyst or follicle in the left ovary.  Intraovarian ectopic pregnancy is uncommon.  This should also be followed closely.

## 2006-09-13 IMAGING — US US ABDOMEN COMPLETE
1 series · 13 of 24 positions shown · non-contrast
Comparison: None.

CLINICAL DATA: Right upper quadrant pain.  5 weeks pregnant.
ABDOMEN ULTRASOUND:
TECHNIQUE: Complete abdominal ultrasound examination was performed including evaluation of the liver, gallbladder, bile ducts, pancreas, kidneys, spleen, IVC, and abdominal aorta.
TECHNIQUE: Both transabdominal and transvaginal ultrasound examinations were performed for complete evaluation of the gestation as well as the maternal uterus, adnexal regions, and pelvic cul-de-sac.

[Series 1: us abdomen complete · 0.32mm/px · 13 of 24 slices shown]
[im 1/24]
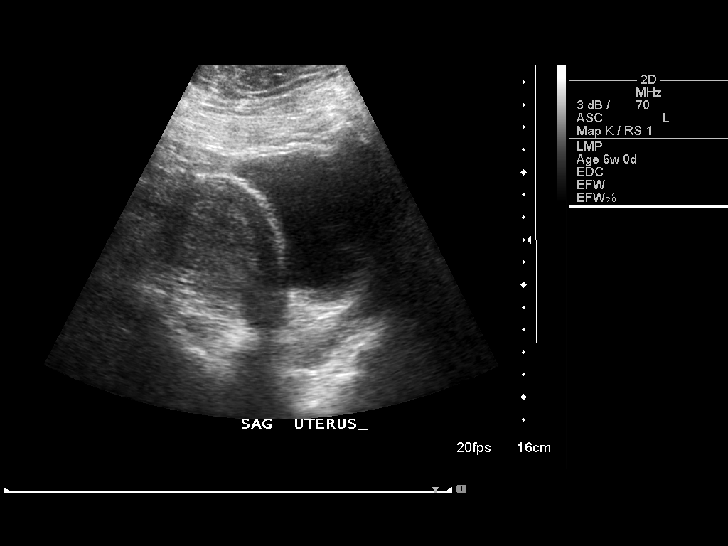
[im 3/24]
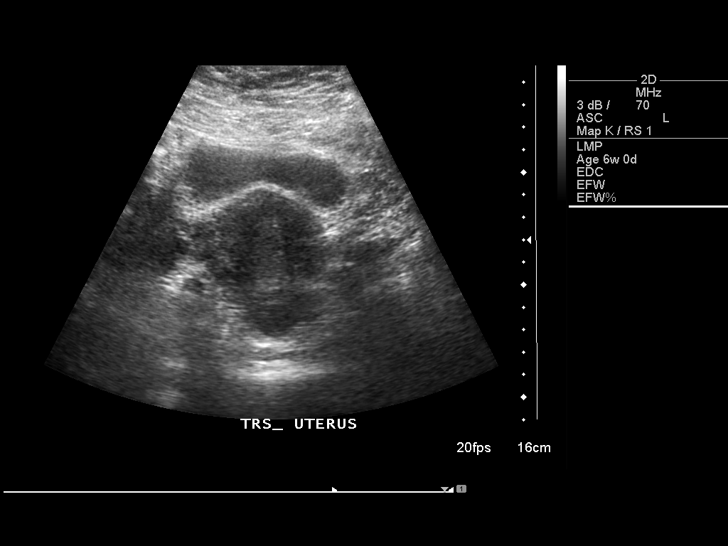
[im 5/24]
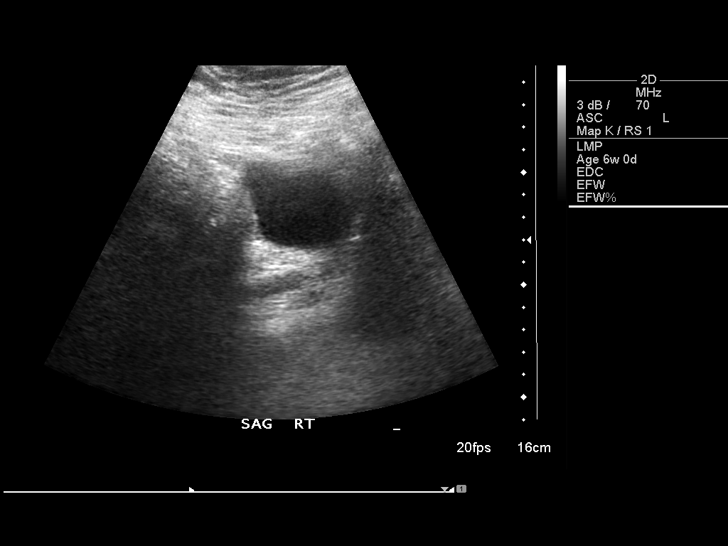
[im 7/24]
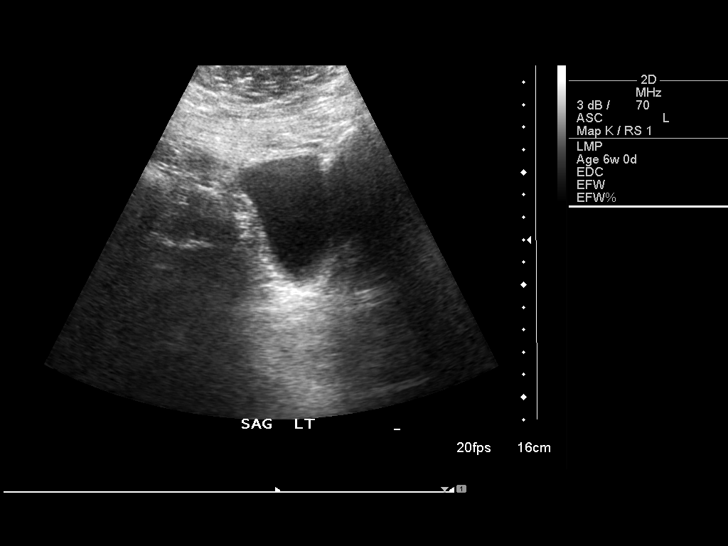
[im 9/24]
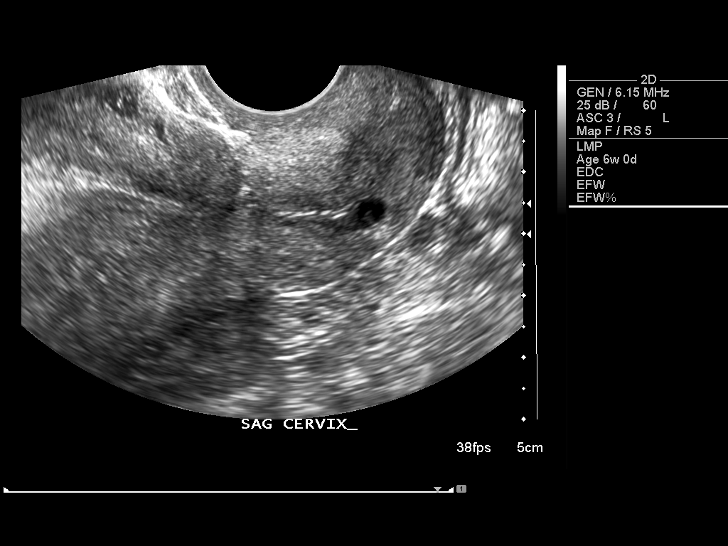
[im 11/24]
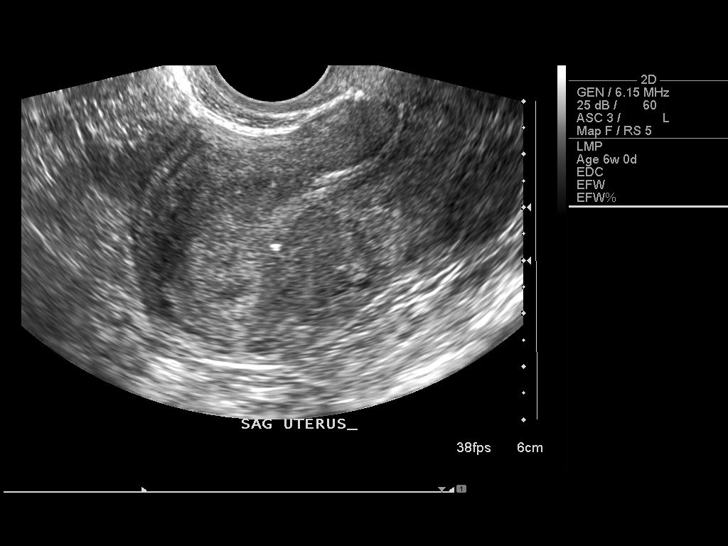
[im 13/24]
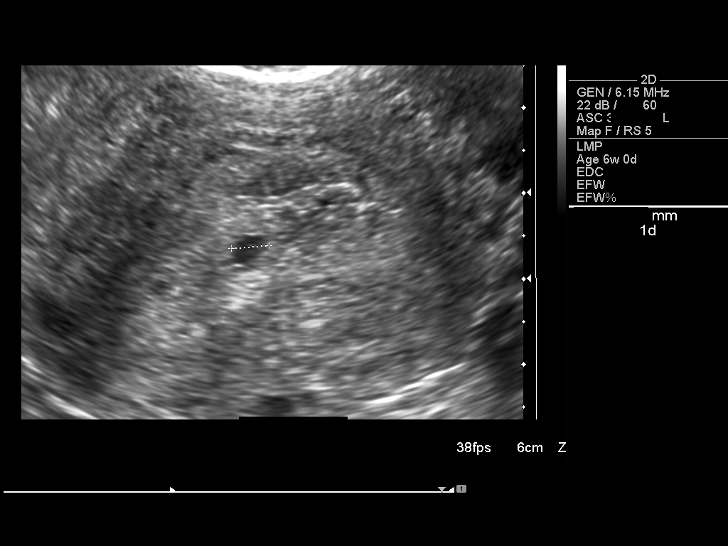
[im 14/24]
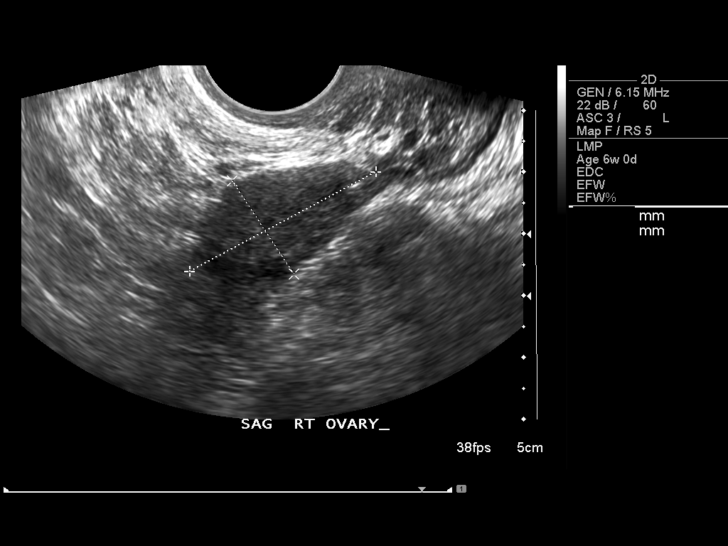
[im 16/24]
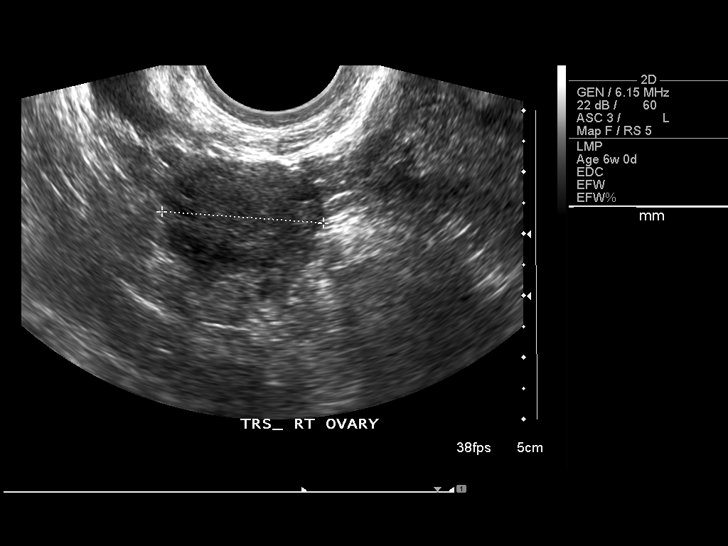
[im 18/24]
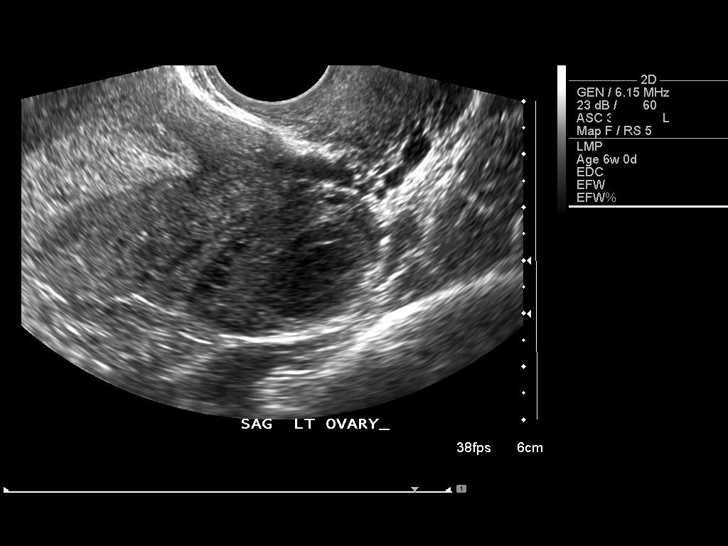
[im 20/24]
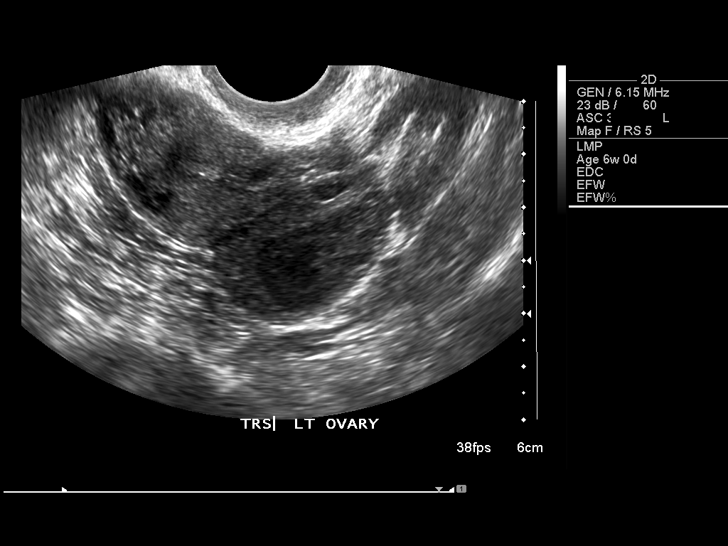
[im 22/24]
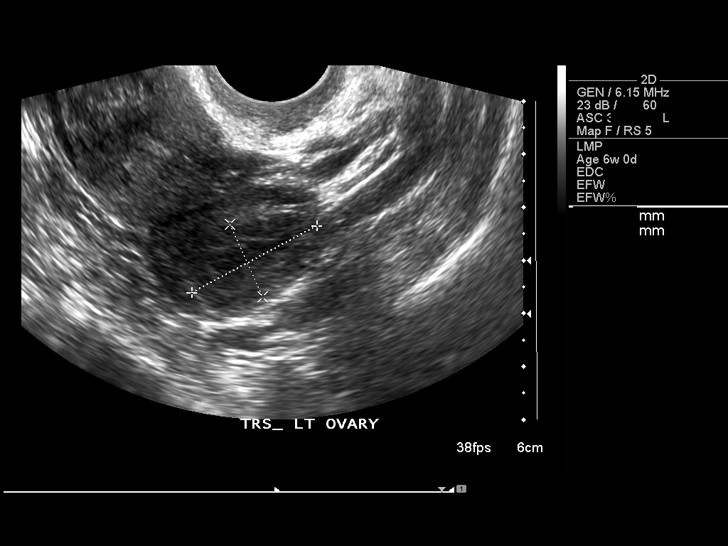
[im 24/24]
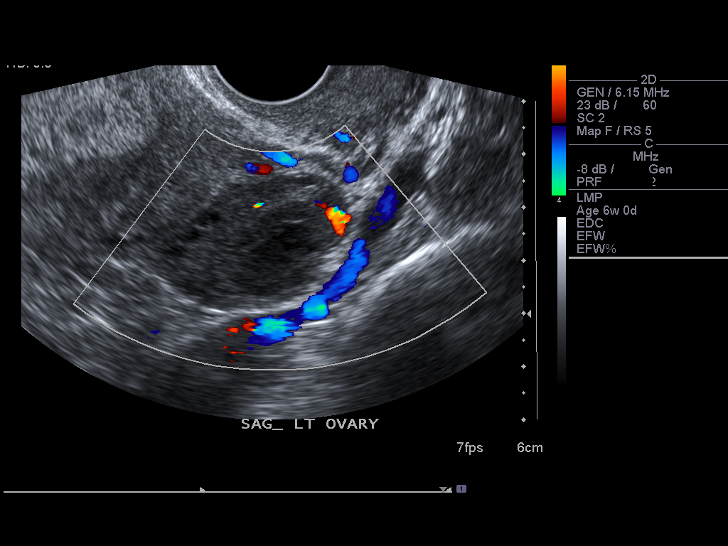

[13 of 24 positions shown; findings below may reference images not displayed]

FINDINGS: Gallbladder:  Normal.
Common bile duct:  Nondilated at 4 mm in diameter.  
Liver:  Normal.
IVC:  Normal.
Pancreas:  Obscured by overlying bowel gas.  
Spleen:  Normal.
Right kidney:  13.1 cm in long axis.  Normal echogenic appearance.  
Left kidney:  12.0 cm in long axis.  Normal appearance.  
Abdominal aorta:  No aneurysm.
IMPRESSION: Pancreas is obscured by overlying bowel gas.  Abdominal ultrasound is otherwise unremarkable.  
OBSTETRICAL ULTRASOUND <14 WKS AND TRANSVAGINAL OB US:
FINDINGS: There is a tiny collection of fluid in the uterus, which appears to be within the decidualized endometrium rather than in the endometrial canal proper.  This would be most suggestive of an intrauterine gestational sac although no yolk sac or embryo is yet apparent.  Mean sac diameter is 4 mm, which would estimate a 5 week 1 day gestational age.  
The right ovary is sonographically normal.  The left ovary contains a 2.7 cm complex cystic lesion.  There is no evidence for free fluid in the cul-de-sac.  No adnexal masses.
IMPRESSION: 1.  Probable intrauterine gestational sac although no embryo or yolk sac is yet apparent.  Quantitative beta hCG by report is 862.  Early IUP and nonvisualized ectopic pregnancy are both considerations.  Close clinical follow-up is recommended with serial beta hCG and follow-up ultrasound as warranted.  
2.  Probable hemorrhagic cyst or follicle in the left ovary.  Intraovarian ectopic pregnancy is uncommon.  This should also be followed closely.

## 2006-09-15 ENCOUNTER — Inpatient Hospital Stay (HOSPITAL_COMMUNITY): Admission: AD | Admit: 2006-09-15 | Discharge: 2006-09-15 | Payer: Self-pay | Admitting: Gynecology

## 2006-09-15 IMAGING — US US OB TRANSVAGINAL
1 series · 13 of 13 positions shown · non-contrast
Comparison: none

OBSTETRICAL ULTRASOUND:

 This ultrasound exam was performed in the [HOSPITAL] Ultrasound Department.  The OB US report was generated in the AS system, and faxed to the ordering physician.  This report is also available in [REDACTED] PACS.

[Series 1: us ob transvaginal · 0.13mm/px · 13 of 13 slices shown]
[im 1/13]
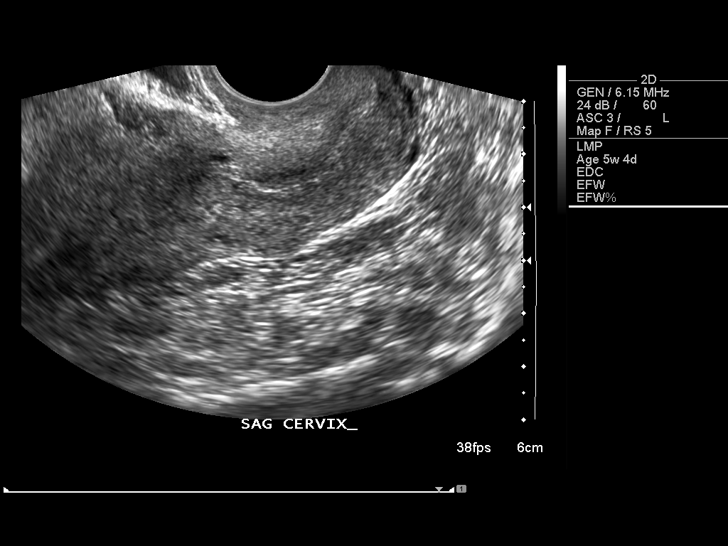
[im 2/13]
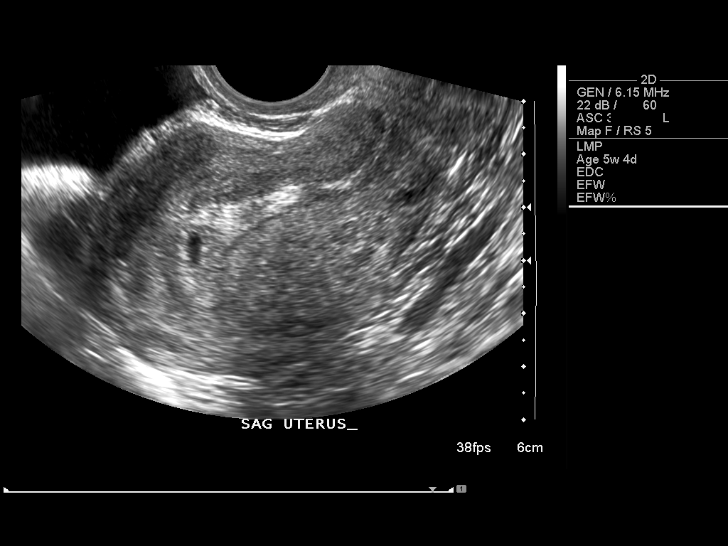
[im 3/13]
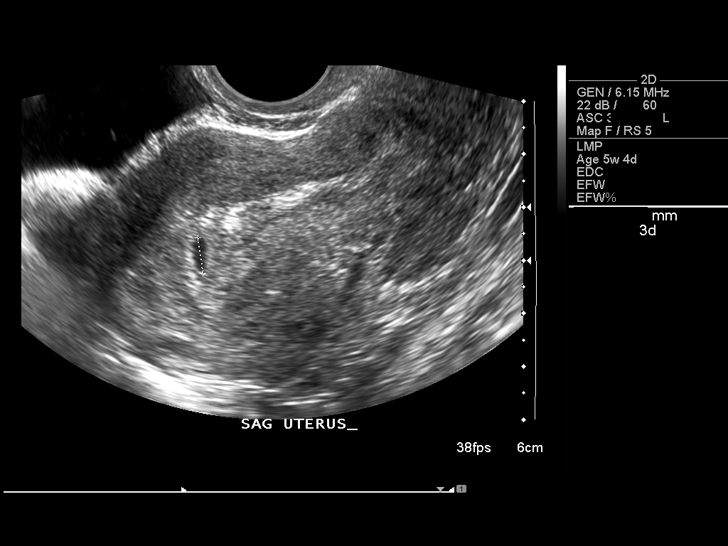
[im 4/13]
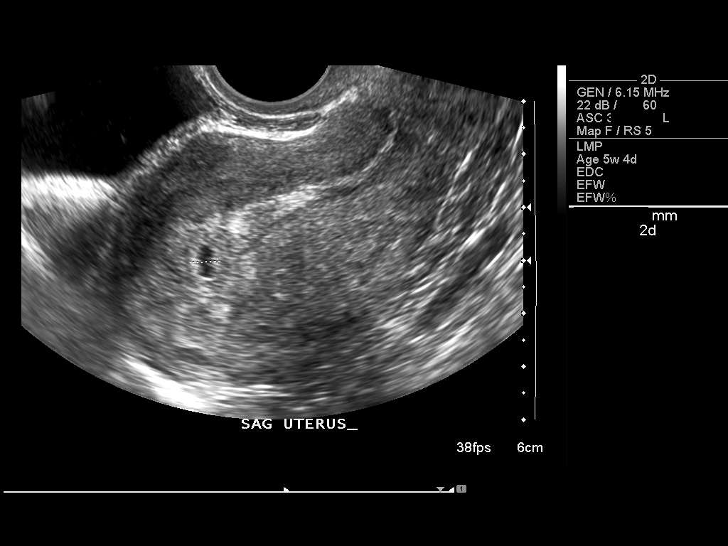
[im 5/13]
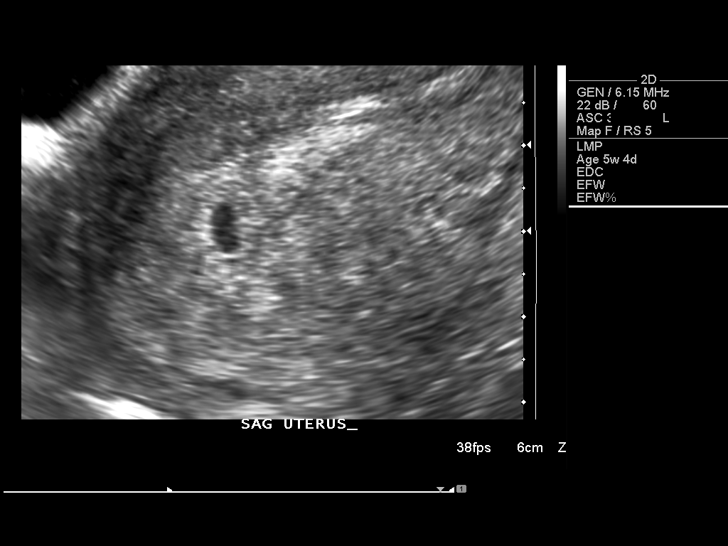
[im 6/13]
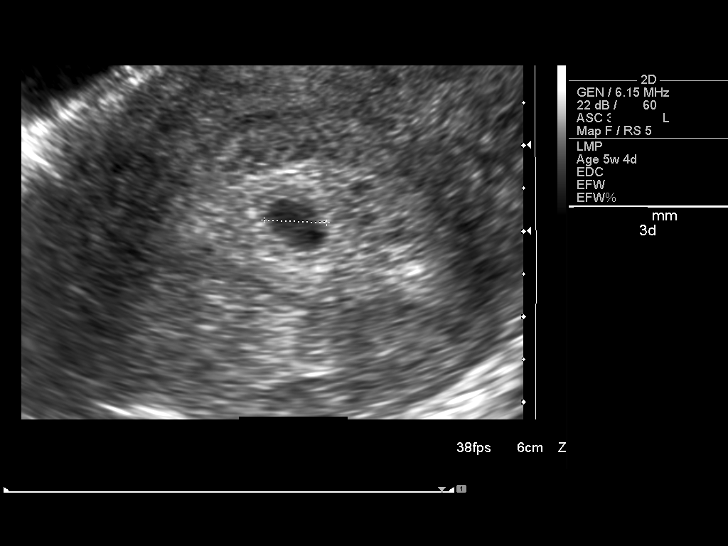
[im 7/13]
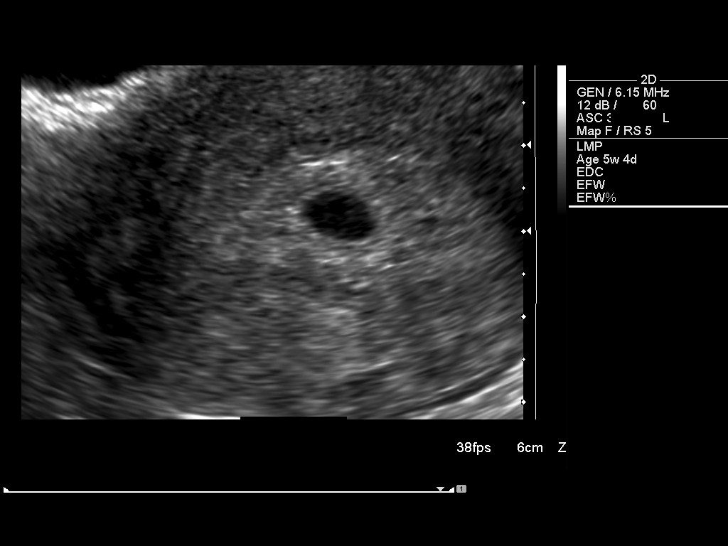
[im 8/13]
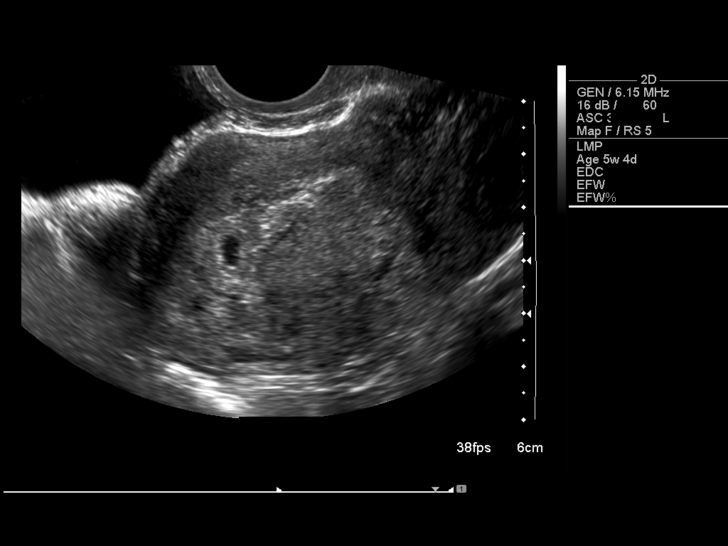
[im 9/13]
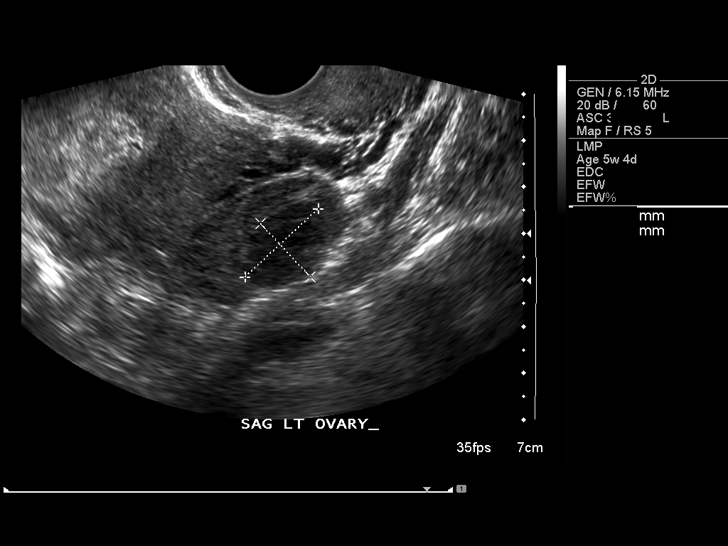
[im 10/13]
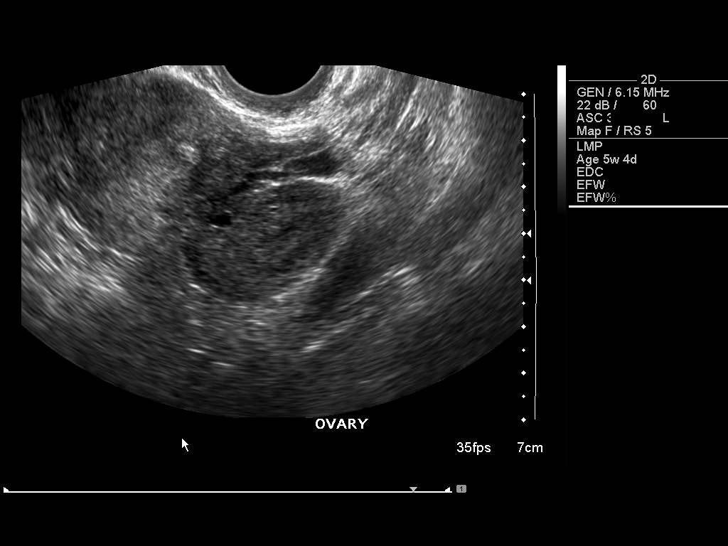
[im 11/13]
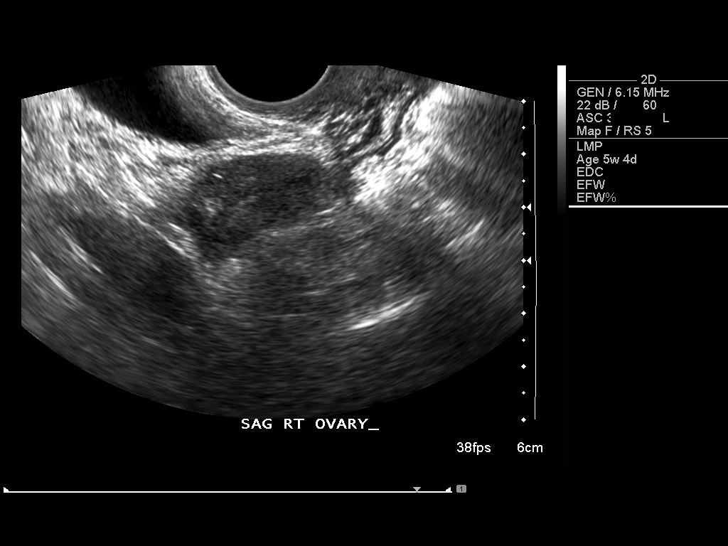
[im 12/13]
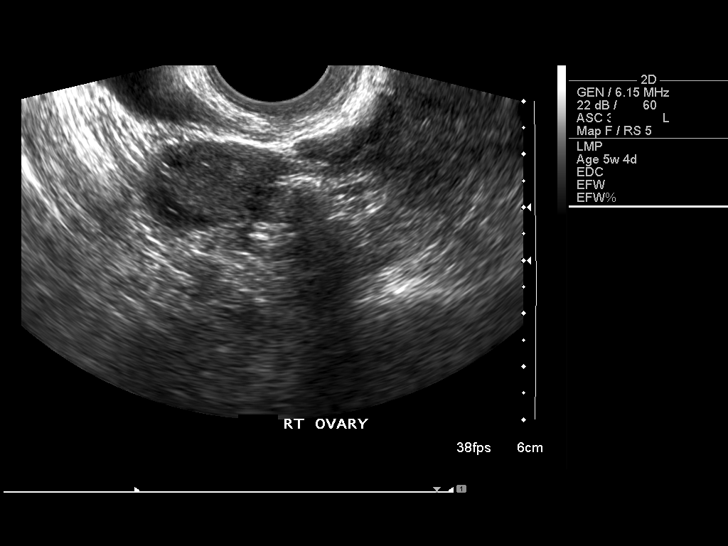
[im 13/13]
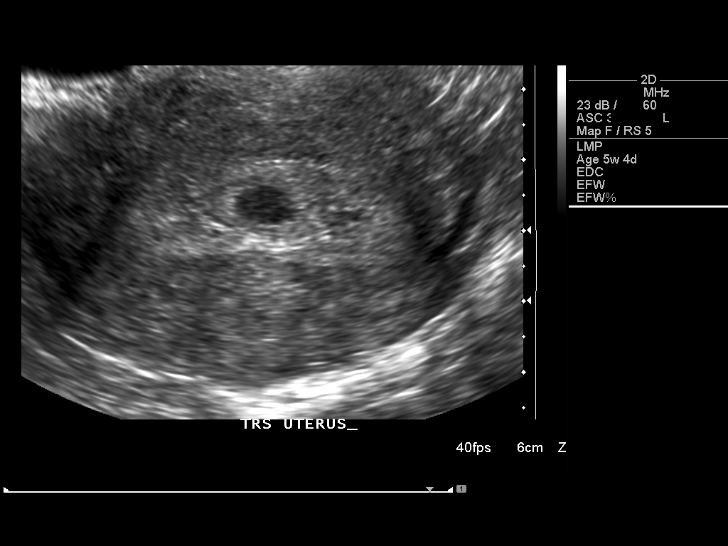

[13 of 13 positions shown; findings below may reference images not displayed]

IMPRESSION: See AS Obstetric US report.

## 2006-09-22 ENCOUNTER — Ambulatory Visit: Payer: Self-pay | Admitting: Family Medicine

## 2006-09-22 ENCOUNTER — Encounter (INDEPENDENT_AMBULATORY_CARE_PROVIDER_SITE_OTHER): Payer: Self-pay | Admitting: Family Medicine

## 2006-09-22 LAB — CONVERTED CEMR LAB: GC Probe Amp, Genital: NEGATIVE

## 2006-09-24 ENCOUNTER — Ambulatory Visit (HOSPITAL_COMMUNITY): Admission: RE | Admit: 2006-09-24 | Discharge: 2006-09-24 | Payer: Self-pay | Admitting: Family Medicine

## 2006-09-24 IMAGING — US US OB TRANSVAGINAL
1 series · 14 of 28 positions shown · non-contrast
Comparison: none

OBSTETRICAL ULTRASOUND:

 This ultrasound exam was performed in the [HOSPITAL] Ultrasound Department.  The OB US report was generated in the AS system, and faxed to the ordering physician.  This report is also available in [REDACTED] PACS.

[Series 1: us ob transvaginal · 0.15mm/px · 14 of 37 slices shown]
[im 2/37]
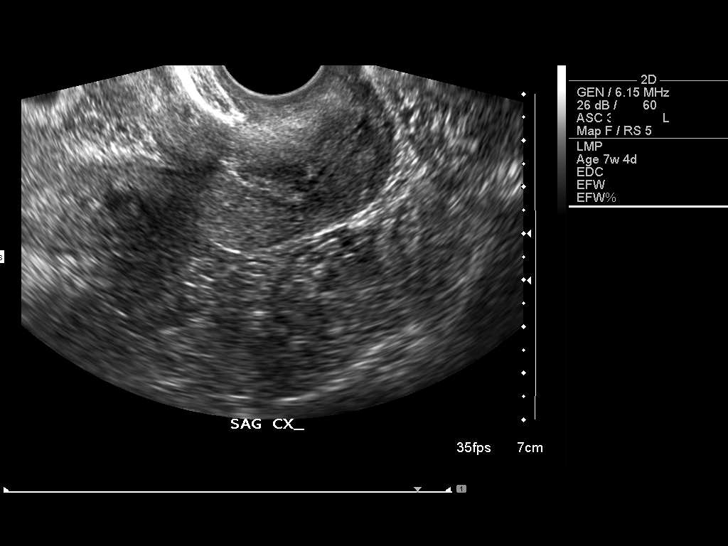
[im 5/37]
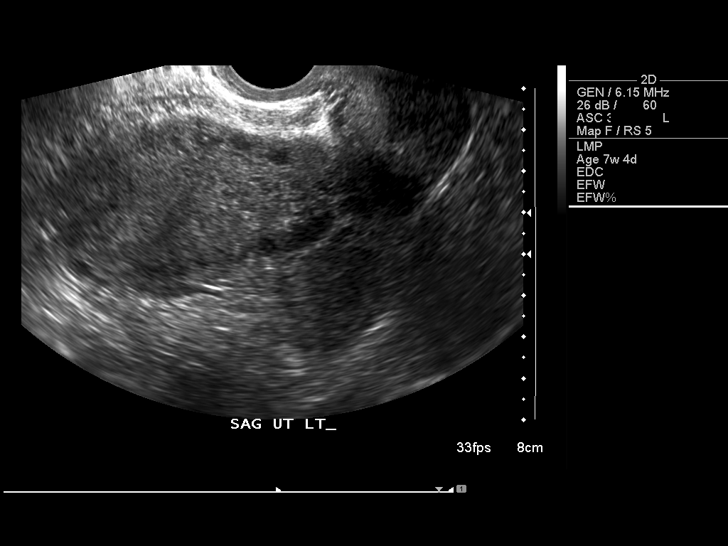
[im 7/37]
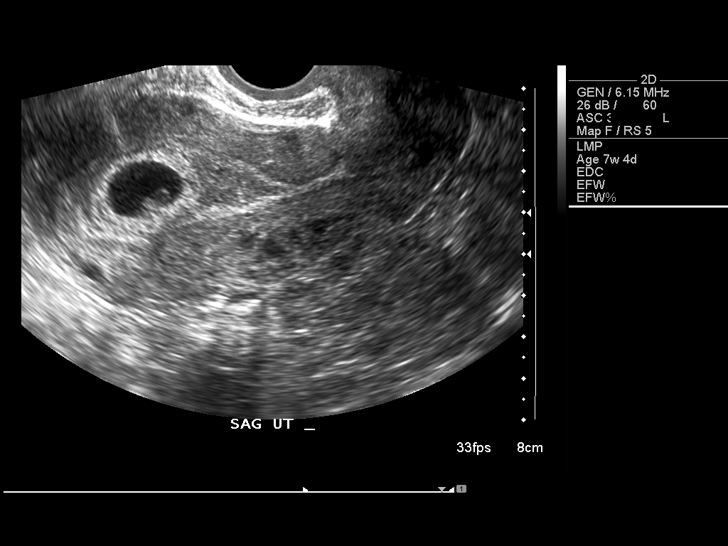
[im 10/37]
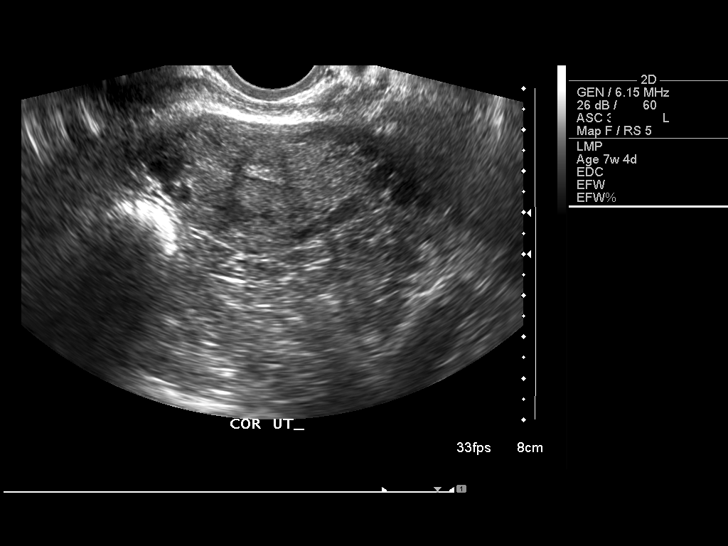
[im 13/37]
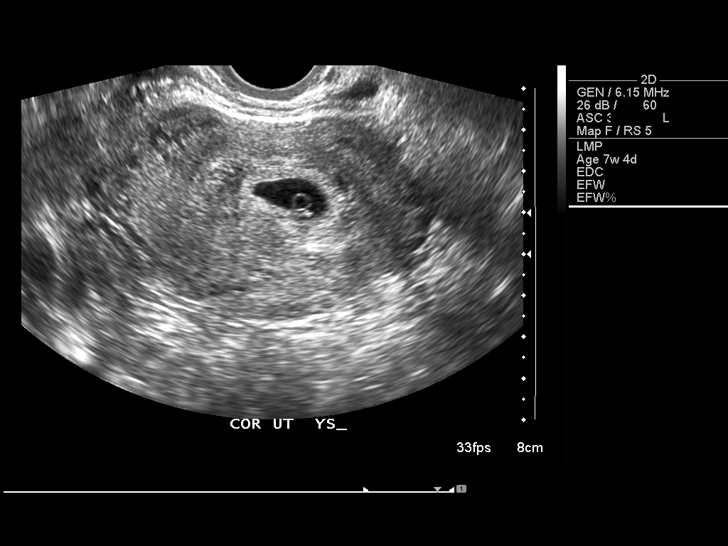
[im 15/37]
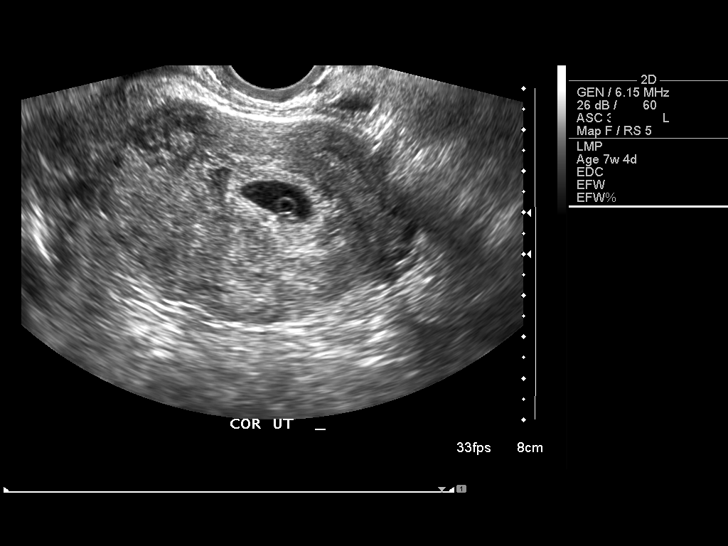
[im 18/37]
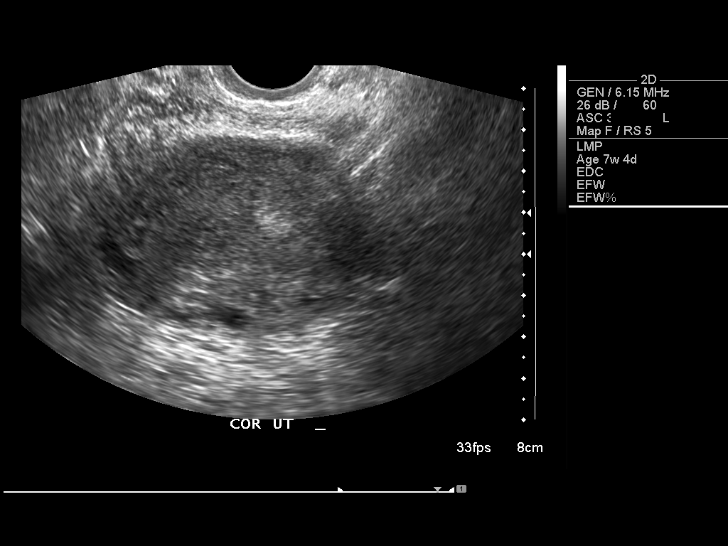
[im 21/37]
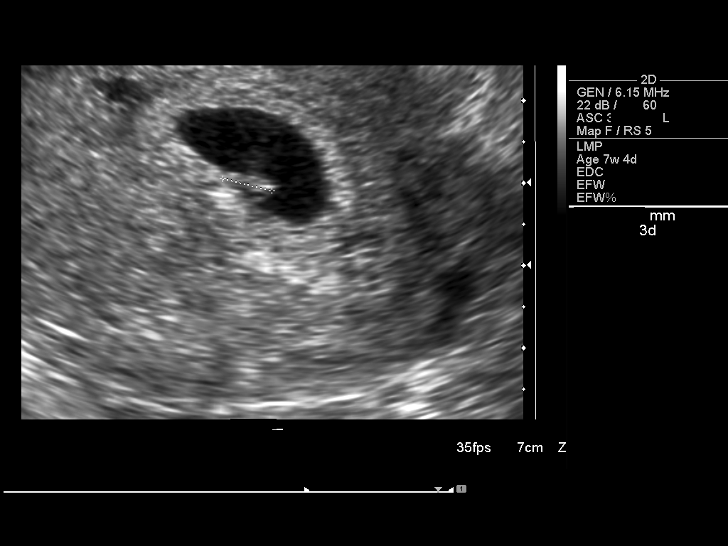
[im 23/37]
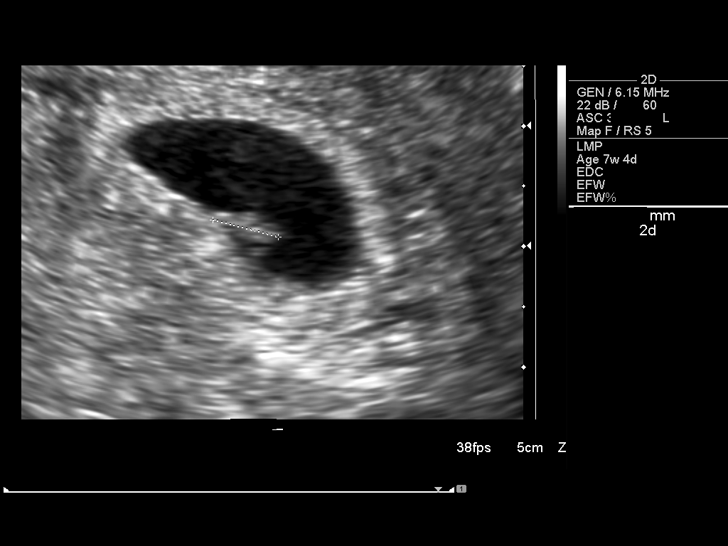
[im 26/37]
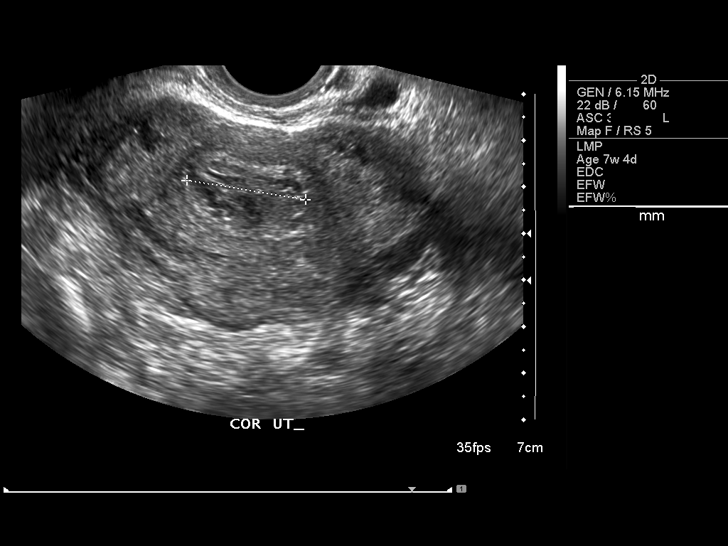
[im 29/37]
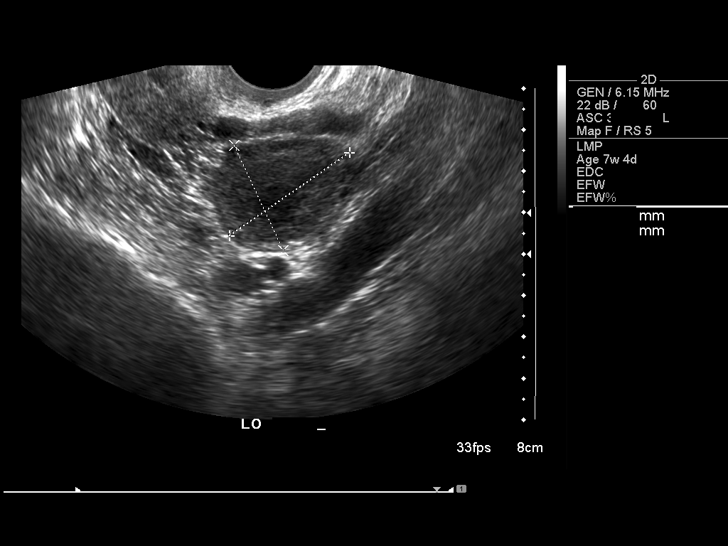
[im 31/37]
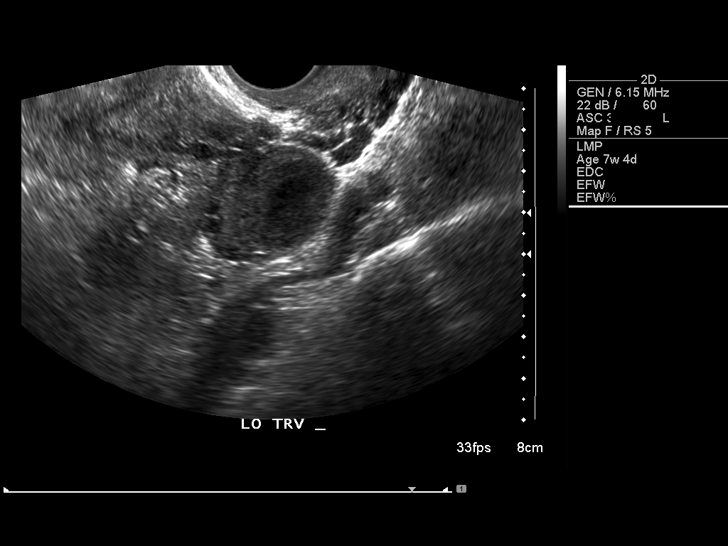
[im 34/37]
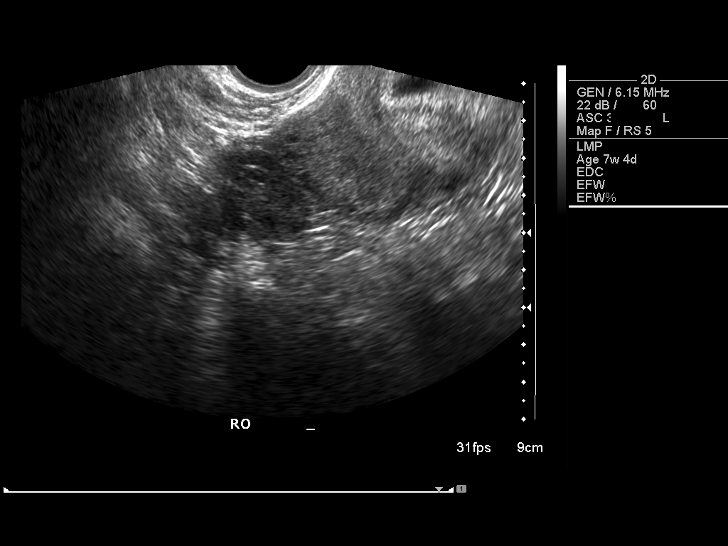
[im 37/37]
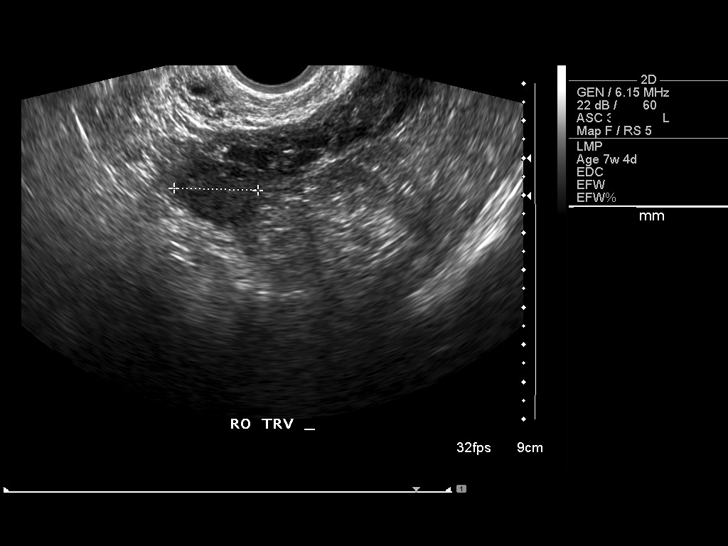

[14 of 28 positions shown; findings below may reference images not displayed]

IMPRESSION: See AS Obstetric US report.

## 2006-09-29 ENCOUNTER — Telehealth (INDEPENDENT_AMBULATORY_CARE_PROVIDER_SITE_OTHER): Payer: Self-pay | Admitting: Family Medicine

## 2006-10-04 ENCOUNTER — Encounter (INDEPENDENT_AMBULATORY_CARE_PROVIDER_SITE_OTHER): Payer: Self-pay | Admitting: Family Medicine

## 2006-10-04 ENCOUNTER — Ambulatory Visit: Payer: Self-pay | Admitting: Family Medicine

## 2006-10-04 LAB — CONVERTED CEMR LAB
Antibody Screen: POSITIVE — AB
Basophils Absolute: 0 10*3/uL (ref 0.0–0.1)
Basophils Relative: 0 % (ref 0–1)
Eosinophils Absolute: 0.4 10*3/uL (ref 0.0–0.7)
Eosinophils Relative: 3 % (ref 0–5)
HCT: 41.1 % (ref 36.0–46.0)
Hemoglobin: 13.3 g/dL (ref 12.0–15.0)
Hepatitis B Surface Ag: NEGATIVE
Lymphocytes Relative: 22 % (ref 12–46)
Lymphs Abs: 2.5 10*3/uL (ref 0.7–3.3)
MCHC: 32.4 g/dL (ref 30.0–36.0)
MCV: 86.9 fL (ref 78.0–100.0)
Monocytes Absolute: 0.6 10*3/uL (ref 0.2–0.7)
Monocytes Relative: 5 % (ref 3–11)
Neutro Abs: 8.1 10*3/uL — ABNORMAL HIGH (ref 1.7–7.7)
Neutrophils Relative %: 69 % (ref 43–77)
Platelets: 243 10*3/uL (ref 150–400)
RBC: 4.73 M/uL (ref 3.87–5.11)
RDW: 14 % (ref 11.5–14.0)
Rh Type: POSITIVE
Rubella: 218.7 intl units/mL — ABNORMAL HIGH
WBC: 11.6 10*3/uL — ABNORMAL HIGH (ref 4.0–10.5)
hCG, Beta Chain, Quant, S: 58315.5 milliintl units/mL

## 2006-10-06 ENCOUNTER — Telehealth (INDEPENDENT_AMBULATORY_CARE_PROVIDER_SITE_OTHER): Payer: Self-pay | Admitting: *Deleted

## 2006-10-12 ENCOUNTER — Ambulatory Visit: Payer: Self-pay | Admitting: Family Medicine

## 2006-10-14 ENCOUNTER — Ambulatory Visit: Payer: Self-pay | Admitting: Obstetrics & Gynecology

## 2006-10-22 ENCOUNTER — Inpatient Hospital Stay (HOSPITAL_COMMUNITY): Admission: AD | Admit: 2006-10-22 | Discharge: 2006-10-22 | Payer: Self-pay | Admitting: Family Medicine

## 2006-11-11 ENCOUNTER — Ambulatory Visit: Payer: Self-pay | Admitting: Obstetrics & Gynecology

## 2006-11-25 ENCOUNTER — Ambulatory Visit: Payer: Self-pay | Admitting: Obstetrics & Gynecology

## 2006-12-06 DIAGNOSIS — J4541 Moderate persistent asthma with (acute) exacerbation: Secondary | ICD-10-CM | POA: Insufficient documentation

## 2006-12-06 HISTORY — DX: Moderate persistent asthma with (acute) exacerbation: J45.41

## 2006-12-08 ENCOUNTER — Telehealth (INDEPENDENT_AMBULATORY_CARE_PROVIDER_SITE_OTHER): Payer: Self-pay | Admitting: *Deleted

## 2006-12-08 ENCOUNTER — Ambulatory Visit: Payer: Self-pay | Admitting: Sports Medicine

## 2006-12-17 ENCOUNTER — Ambulatory Visit (HOSPITAL_COMMUNITY): Admission: RE | Admit: 2006-12-17 | Discharge: 2006-12-17 | Payer: Self-pay | Admitting: Internal Medicine

## 2006-12-17 IMAGING — US US OB COMP +14 WK
2 series · 14 of 28 positions shown · non-contrast
Comparison: none

OBSTETRICAL ULTRASOUND:

 This ultrasound exam was performed in the [HOSPITAL] Ultrasound Department.  The OB US report was generated in the AS system, and faxed to the ordering physician.  This report is also available in [REDACTED] PACS.

[Series 1: us ob comp +14 wk · 13 of 75 slices shown (1 of 2)]
[im 3/75]
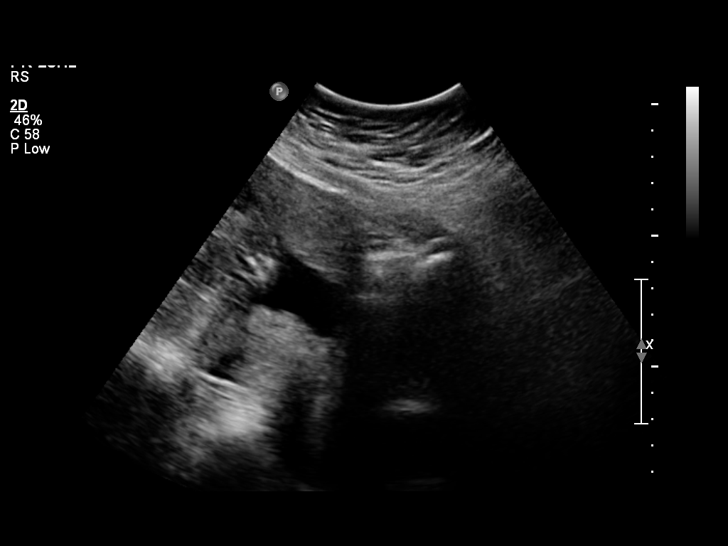
[im 9/75]
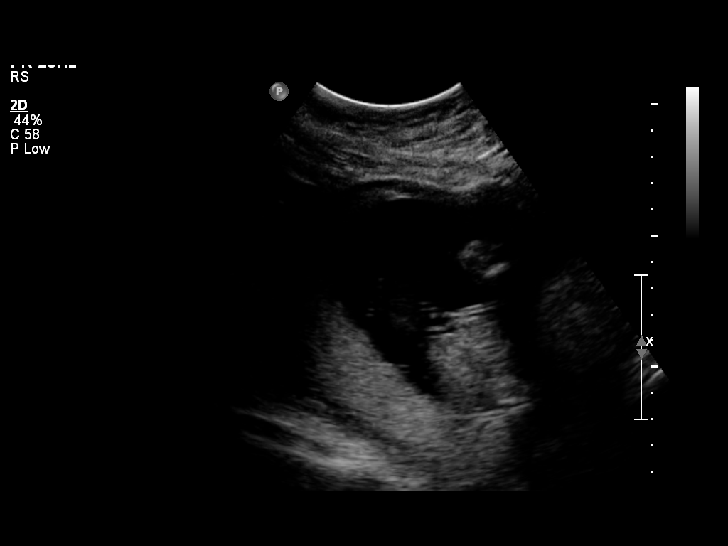
[im 15/75]
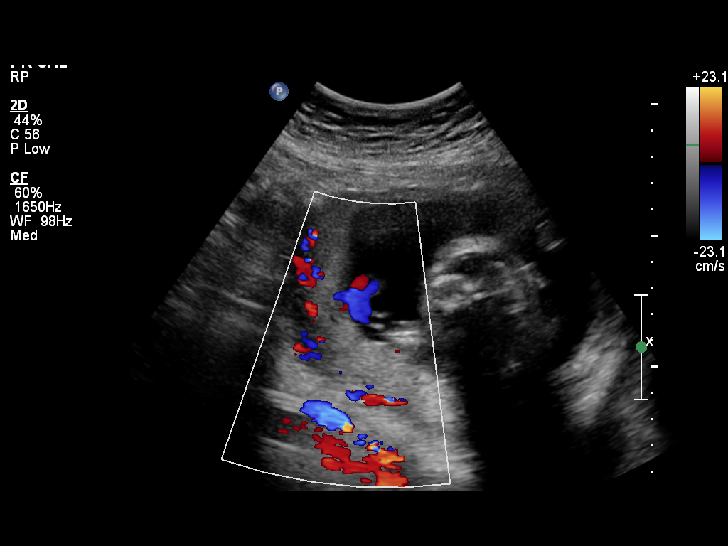
[im 21/75]
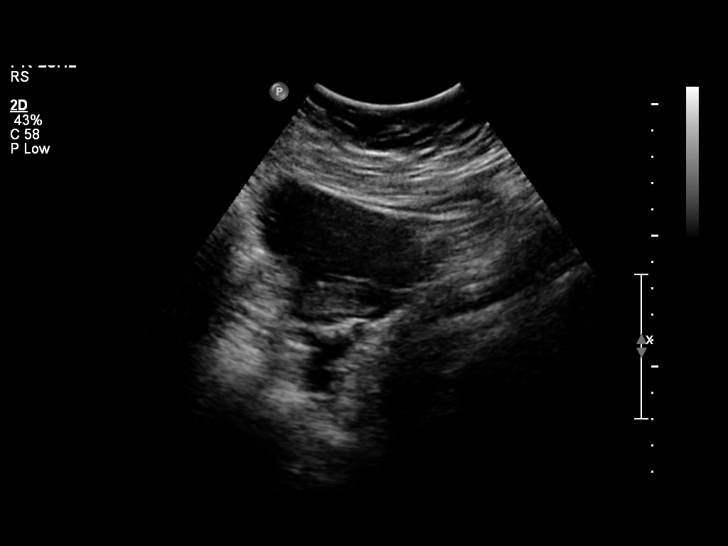
[im 27/75]
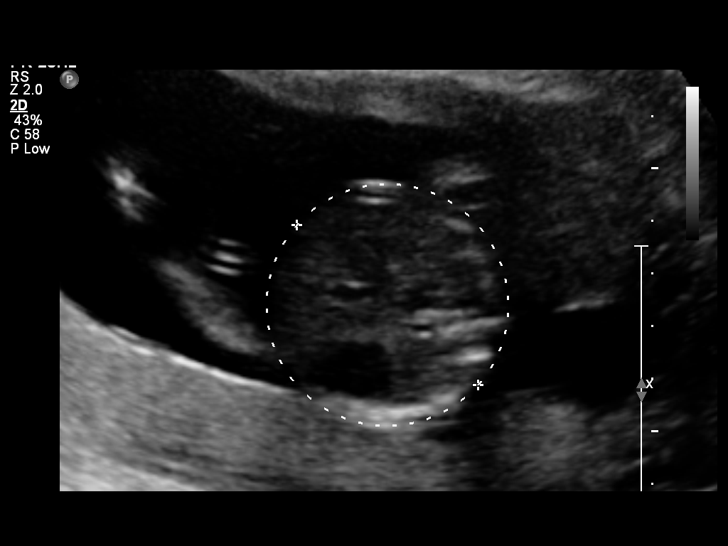
[im 33/75]
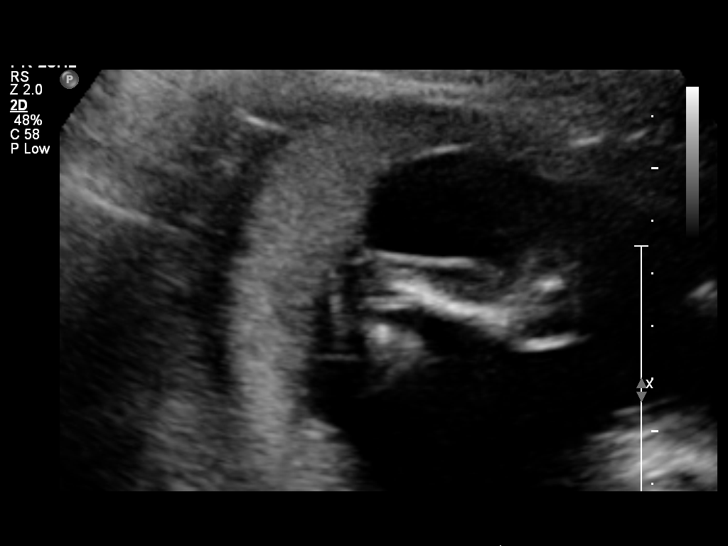
[im 39/75]
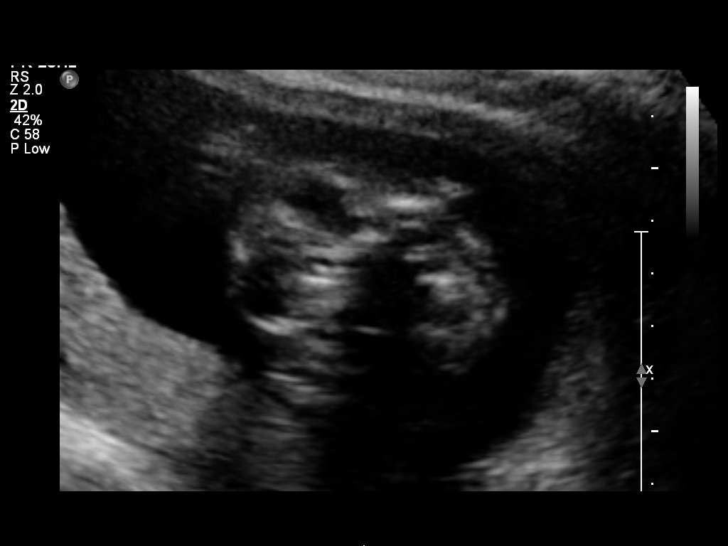
[im 45/75]
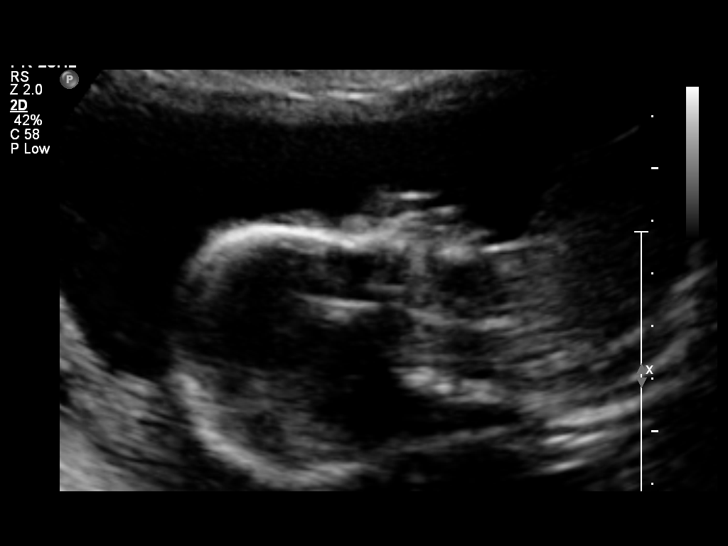
[im 51/75]
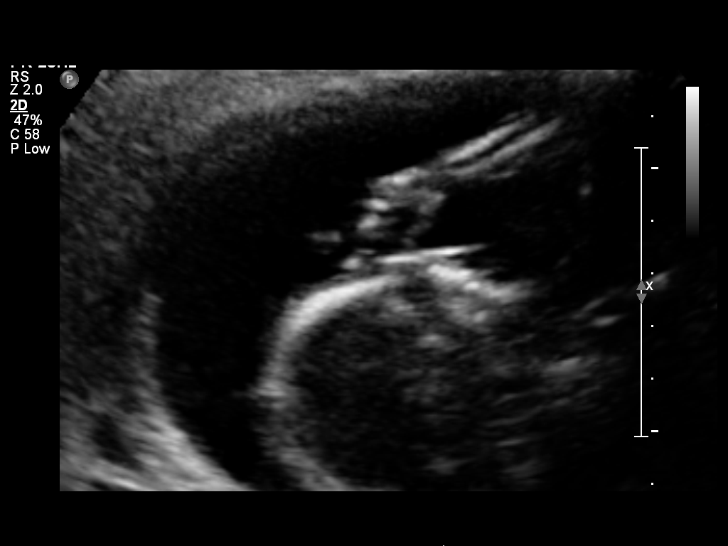
[im 57/75]
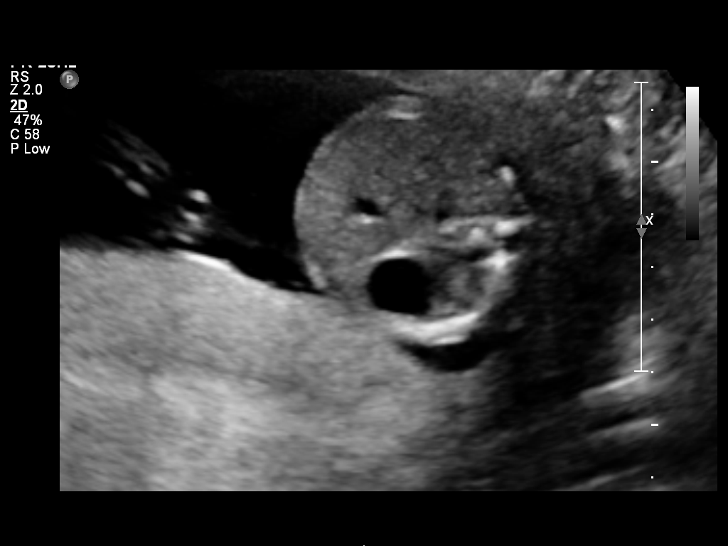
[im 63/75]
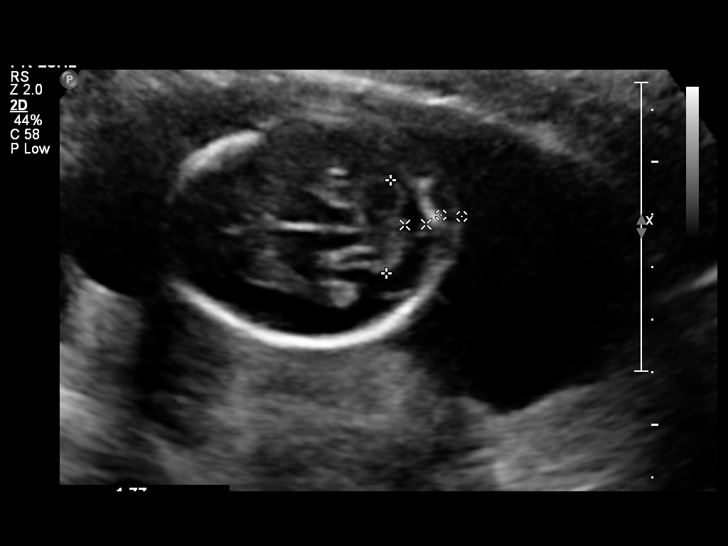
[im 69/75]
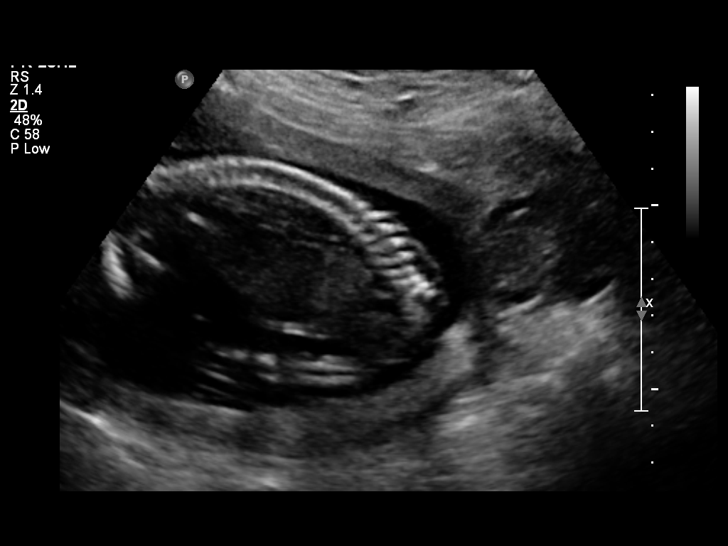
[im 75/75]
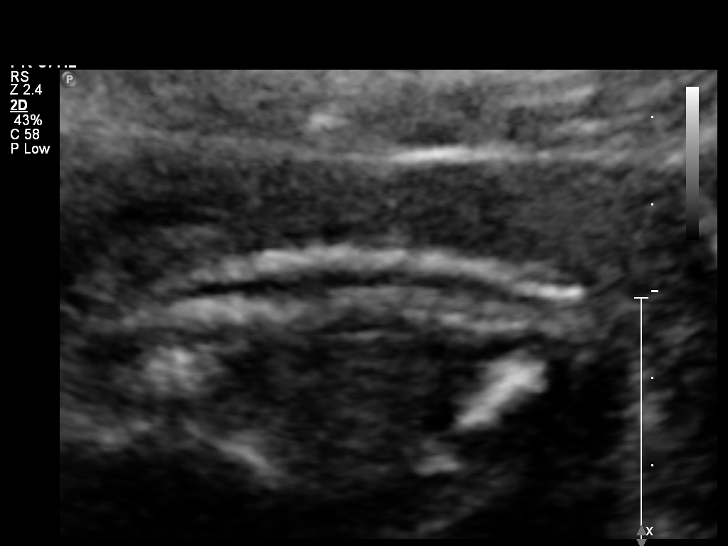

[Series 1: us ob comp +14 wk · 1 of 5 slices shown (2 of 2)]
[im 5/5]
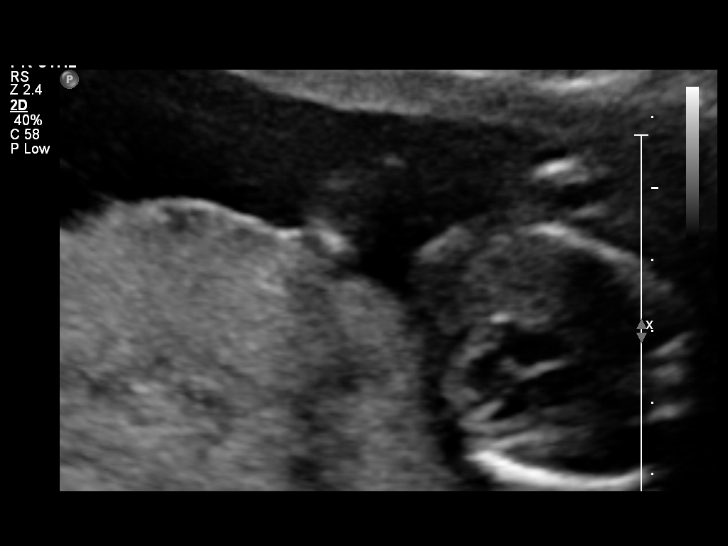

[14 of 28 positions shown; findings below may reference images not displayed]

IMPRESSION: See AS Obstetric US report.

## 2006-12-20 ENCOUNTER — Inpatient Hospital Stay (HOSPITAL_COMMUNITY): Admission: AD | Admit: 2006-12-20 | Discharge: 2006-12-20 | Payer: Self-pay | Admitting: Obstetrics and Gynecology

## 2006-12-23 ENCOUNTER — Ambulatory Visit: Payer: Self-pay | Admitting: Gynecology

## 2007-01-06 ENCOUNTER — Inpatient Hospital Stay (HOSPITAL_COMMUNITY): Admission: AD | Admit: 2007-01-06 | Discharge: 2007-01-06 | Payer: Self-pay | Admitting: Obstetrics and Gynecology

## 2007-01-20 ENCOUNTER — Ambulatory Visit: Payer: Self-pay | Admitting: Obstetrics & Gynecology

## 2007-01-21 ENCOUNTER — Encounter (INDEPENDENT_AMBULATORY_CARE_PROVIDER_SITE_OTHER): Payer: Self-pay | Admitting: Family Medicine

## 2007-02-10 ENCOUNTER — Ambulatory Visit: Payer: Self-pay | Admitting: Family Medicine

## 2007-02-24 ENCOUNTER — Ambulatory Visit: Payer: Self-pay | Admitting: Obstetrics & Gynecology

## 2007-03-01 ENCOUNTER — Encounter: Payer: Self-pay | Admitting: Internal Medicine

## 2007-03-10 ENCOUNTER — Ambulatory Visit: Payer: Self-pay | Admitting: *Deleted

## 2007-03-24 ENCOUNTER — Ambulatory Visit: Payer: Self-pay | Admitting: Family Medicine

## 2007-03-25 ENCOUNTER — Ambulatory Visit (HOSPITAL_COMMUNITY): Admission: RE | Admit: 2007-03-25 | Discharge: 2007-03-25 | Payer: Self-pay | Admitting: Family Medicine

## 2007-03-25 IMAGING — US US OB FOLLOW-UP
1 series · 3 of 3 positions shown · non-contrast
Comparison: none

OBSTETRICAL ULTRASOUND:

 This ultrasound exam was performed in the [HOSPITAL] Ultrasound Department.  The OB US report was generated in the AS system, and faxed to the ordering physician.  This report is also available in [REDACTED] PACS.

[Series 1: us ob follow-up · 0.30mm/px · 3 of 3 slices shown]
[im 1/3]
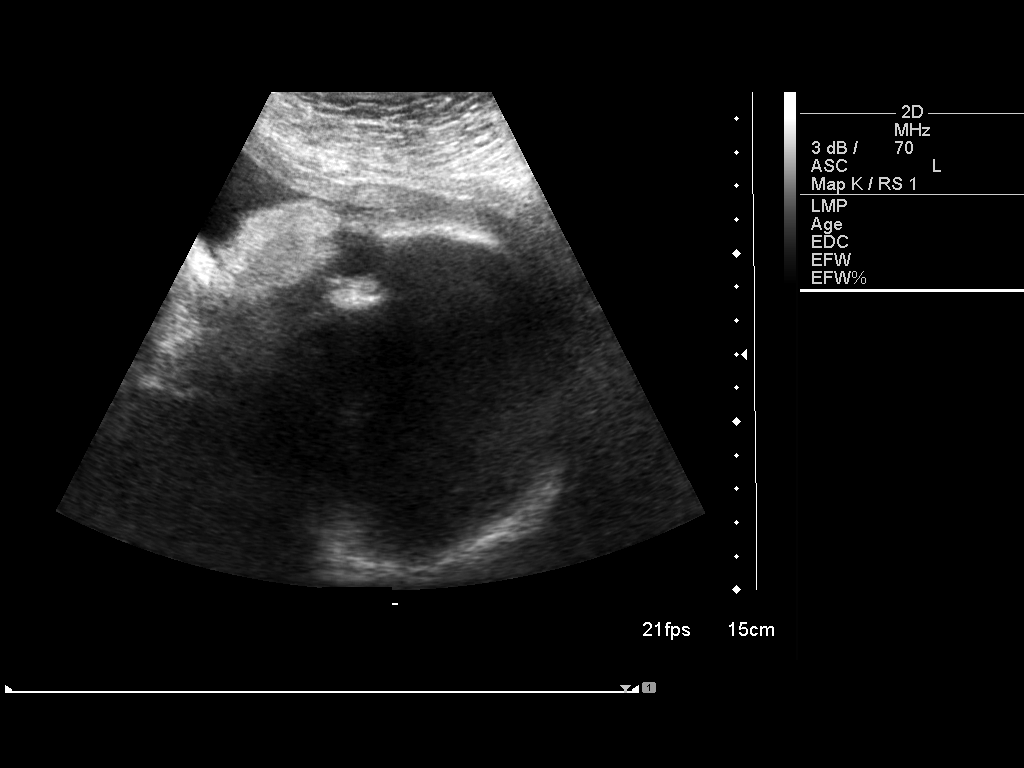
[im 2/3]
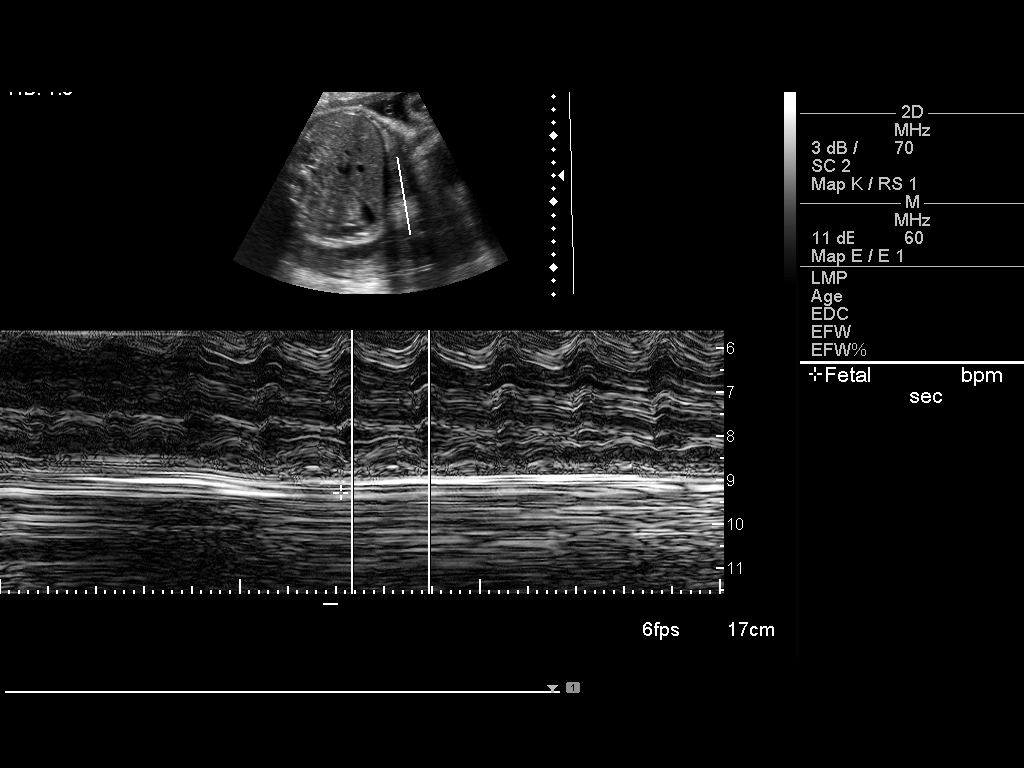
[im 3/3]
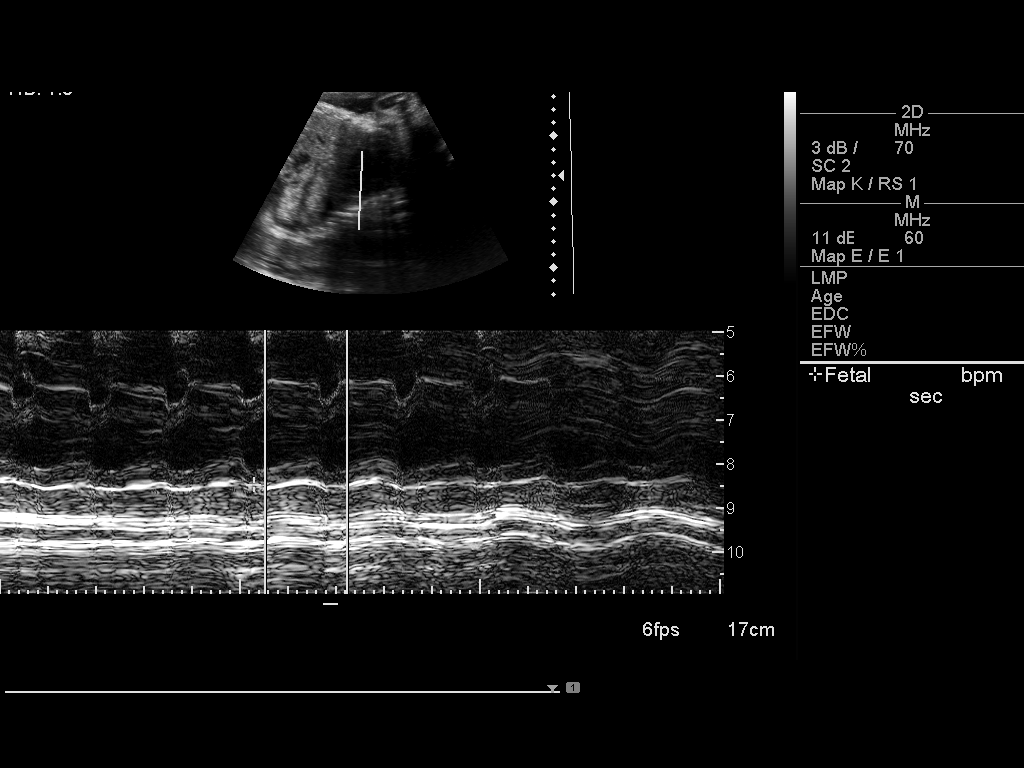

[3 of 3 positions shown; findings below may reference images not displayed]

IMPRESSION: See AS Obstetric US report.

## 2007-03-25 IMAGING — US US OB FOLLOW-UP
1 series · 14 of 28 positions shown · non-contrast
Comparison: none

OBSTETRICAL ULTRASOUND:

 This ultrasound exam was performed in the [HOSPITAL] Ultrasound Department.  The OB US report was generated in the AS system, and faxed to the ordering physician.  This report is also available in [REDACTED] PACS.

[Series 1: us ob follow-up · 0.39mm/px · 14 of 43 slices shown]
[im 2/43]
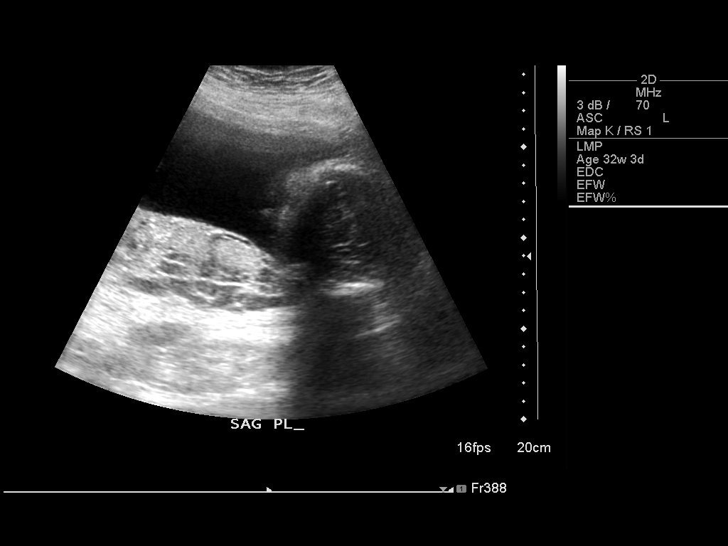
[im 5/43]
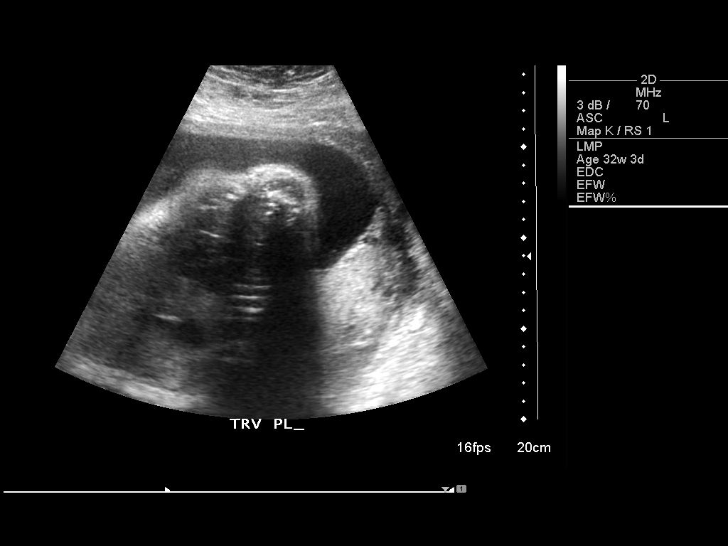
[im 8/43]
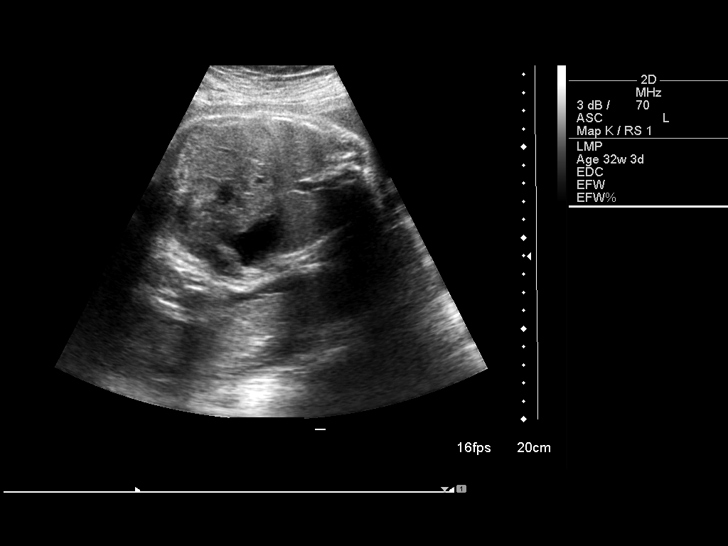
[im 11/43]
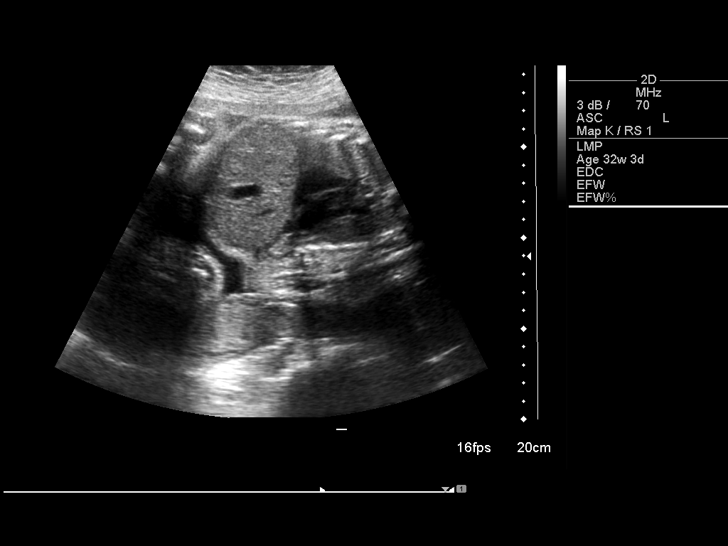
[im 15/43]
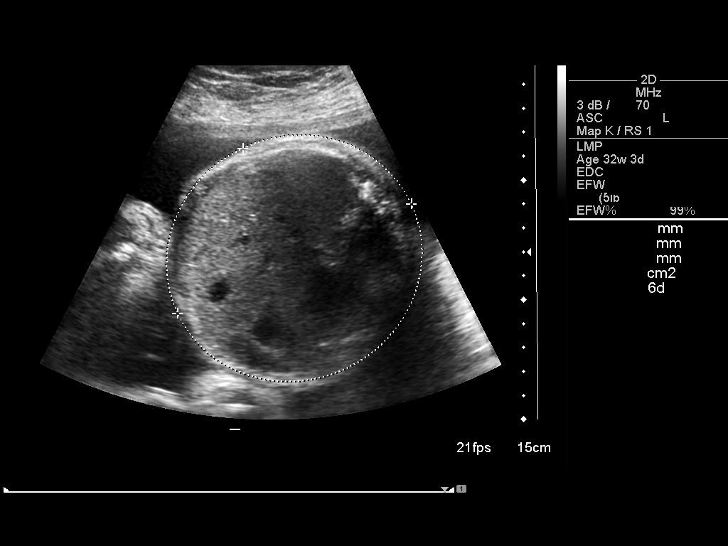
[im 18/43]
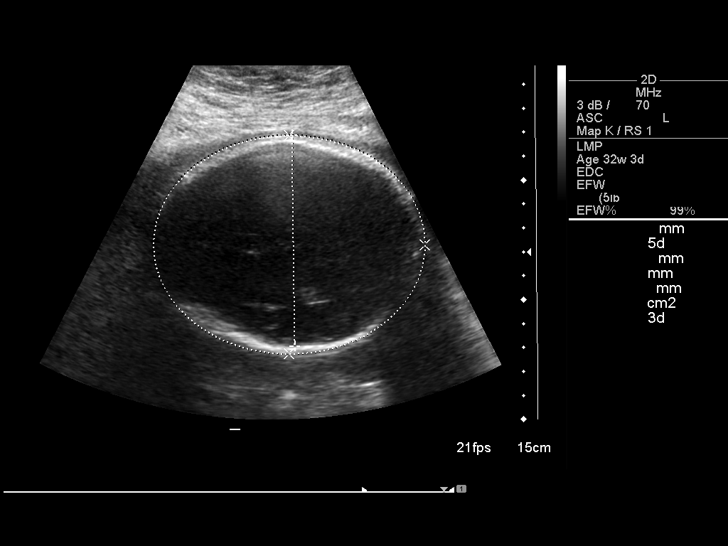
[im 21/43]
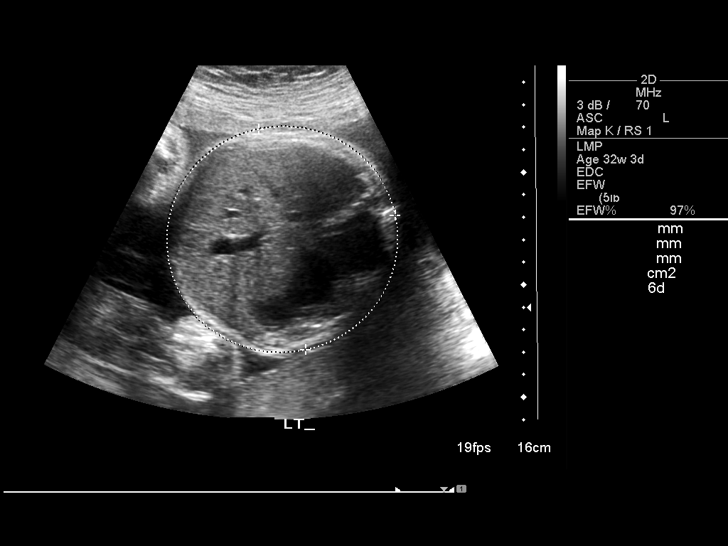
[im 24/43]
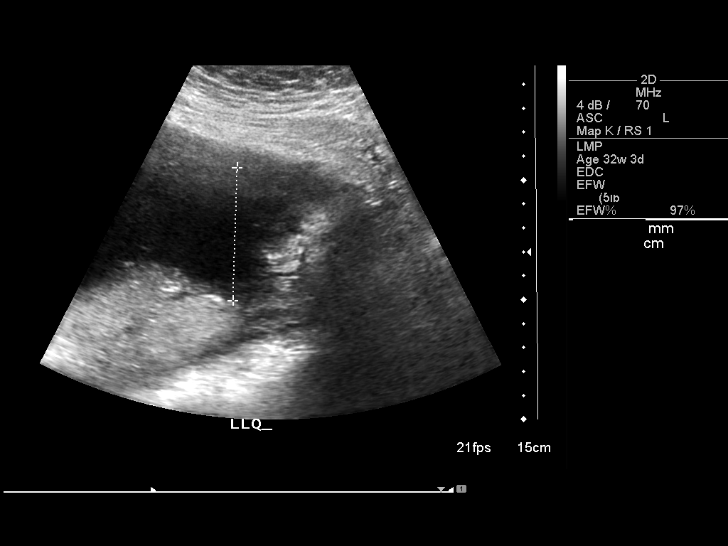
[im 27/43]
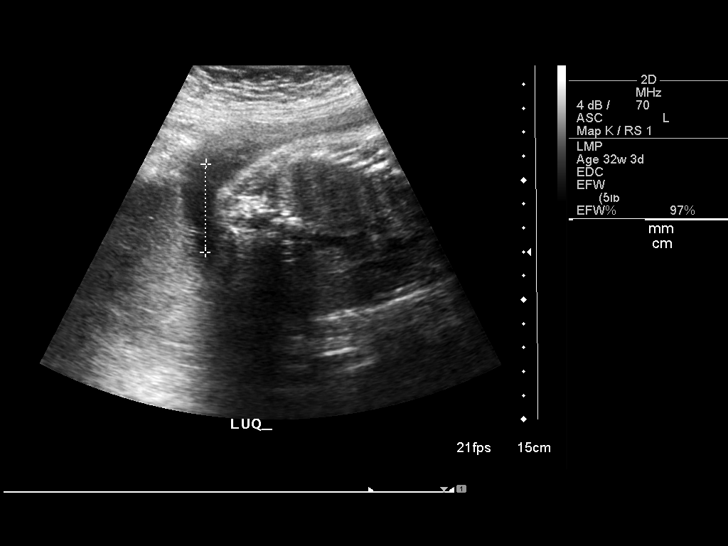
[im 30/43]
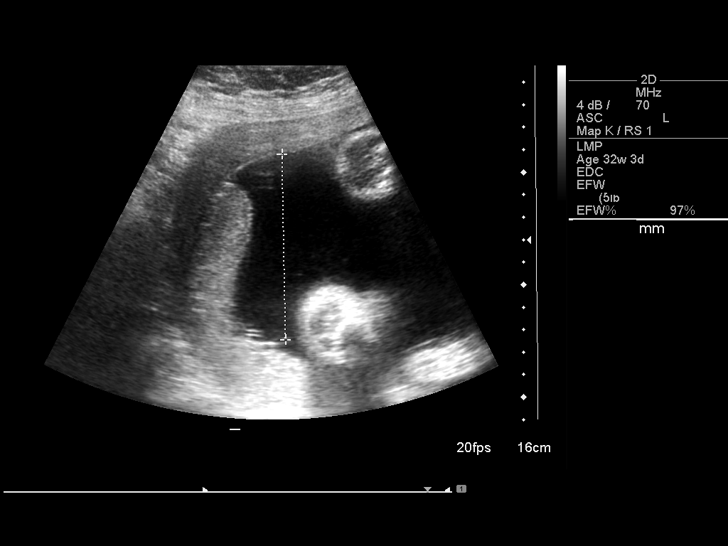
[im 33/43]
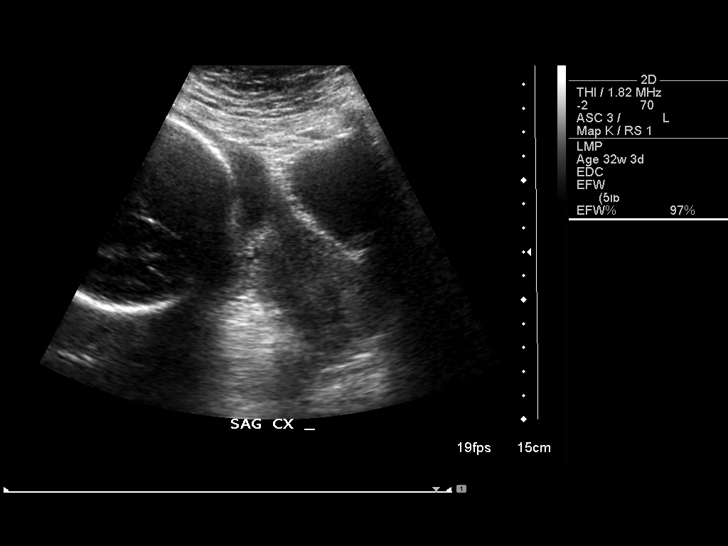
[im 36/43]
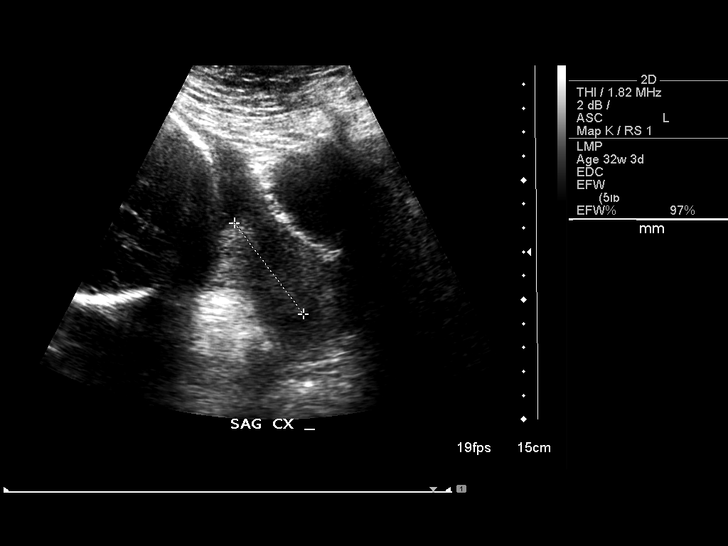
[im 39/43]
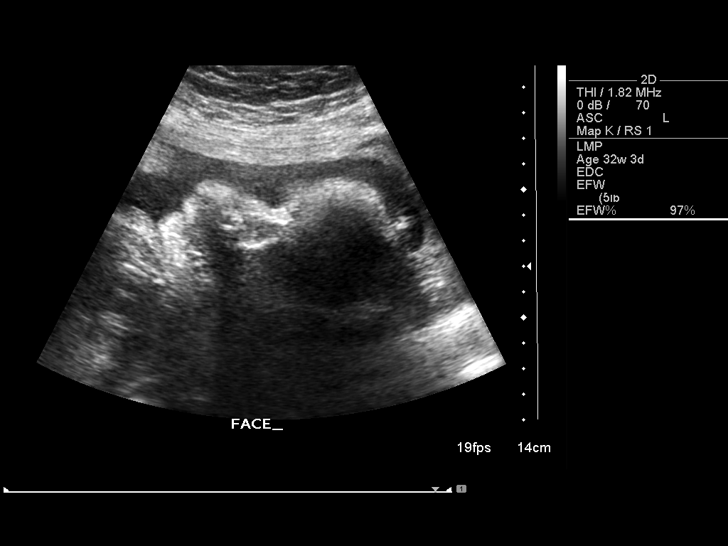
[im 43/43]
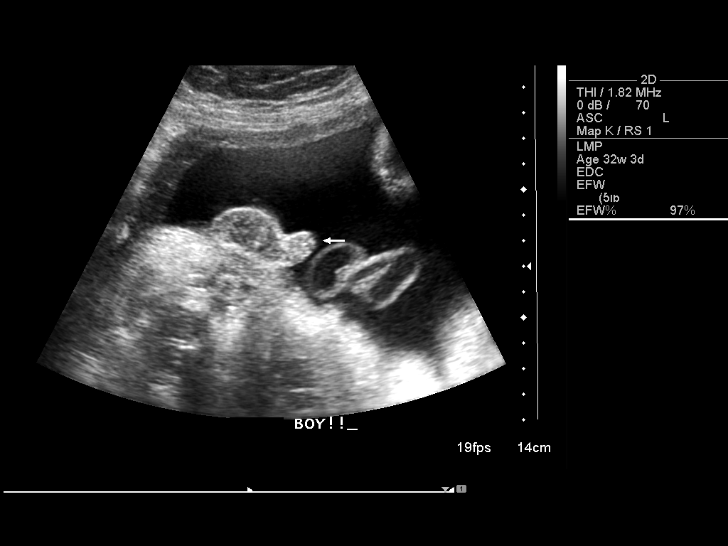

[14 of 28 positions shown; findings below may reference images not displayed]

IMPRESSION: See AS Obstetric US report.

## 2007-03-28 ENCOUNTER — Telehealth: Payer: Self-pay | Admitting: *Deleted

## 2007-03-28 ENCOUNTER — Ambulatory Visit: Payer: Self-pay | Admitting: Sports Medicine

## 2007-04-03 ENCOUNTER — Inpatient Hospital Stay (HOSPITAL_COMMUNITY): Admission: AD | Admit: 2007-04-03 | Discharge: 2007-04-04 | Payer: Self-pay | Admitting: Pediatrics

## 2007-04-07 ENCOUNTER — Ambulatory Visit: Payer: Self-pay | Admitting: Obstetrics & Gynecology

## 2007-04-09 HISTORY — PX: TUBAL LIGATION: SHX77

## 2007-04-12 ENCOUNTER — Inpatient Hospital Stay (HOSPITAL_COMMUNITY): Admission: AD | Admit: 2007-04-12 | Discharge: 2007-04-12 | Payer: Self-pay | Admitting: Gynecology

## 2007-04-12 ENCOUNTER — Ambulatory Visit: Payer: Self-pay | Admitting: Obstetrics and Gynecology

## 2007-04-21 ENCOUNTER — Other Ambulatory Visit: Payer: Self-pay | Admitting: Obstetrics & Gynecology

## 2007-04-21 ENCOUNTER — Ambulatory Visit: Payer: Self-pay | Admitting: *Deleted

## 2007-04-26 ENCOUNTER — Ambulatory Visit: Payer: Self-pay | Admitting: Obstetrics & Gynecology

## 2007-04-26 ENCOUNTER — Ambulatory Visit: Payer: Self-pay | Admitting: Family Medicine

## 2007-04-26 ENCOUNTER — Inpatient Hospital Stay (HOSPITAL_COMMUNITY): Admission: RE | Admit: 2007-04-26 | Discharge: 2007-04-29 | Payer: Self-pay | Admitting: Obstetrics and Gynecology

## 2007-05-03 ENCOUNTER — Telehealth (INDEPENDENT_AMBULATORY_CARE_PROVIDER_SITE_OTHER): Payer: Self-pay | Admitting: *Deleted

## 2007-05-03 ENCOUNTER — Ambulatory Visit: Payer: Self-pay | Admitting: Family Medicine

## 2007-05-09 ENCOUNTER — Inpatient Hospital Stay (HOSPITAL_COMMUNITY): Admission: AD | Admit: 2007-05-09 | Discharge: 2007-05-09 | Payer: Self-pay | Admitting: Obstetrics and Gynecology

## 2007-05-09 ENCOUNTER — Ambulatory Visit: Payer: Self-pay | Admitting: Obstetrics and Gynecology

## 2007-05-17 ENCOUNTER — Encounter (INDEPENDENT_AMBULATORY_CARE_PROVIDER_SITE_OTHER): Payer: Self-pay | Admitting: *Deleted

## 2007-05-17 ENCOUNTER — Ambulatory Visit: Payer: Self-pay | Admitting: Family Medicine

## 2007-05-17 ENCOUNTER — Inpatient Hospital Stay (HOSPITAL_COMMUNITY): Admission: AD | Admit: 2007-05-17 | Discharge: 2007-05-17 | Payer: Self-pay | Admitting: Obstetrics & Gynecology

## 2007-05-17 ENCOUNTER — Ambulatory Visit: Payer: Self-pay | Admitting: Obstetrics & Gynecology

## 2007-05-17 ENCOUNTER — Telehealth (INDEPENDENT_AMBULATORY_CARE_PROVIDER_SITE_OTHER): Payer: Self-pay | Admitting: *Deleted

## 2007-05-17 LAB — CONVERTED CEMR LAB
Chlamydia, DNA Probe: NEGATIVE
GC Probe Amp, Genital: NEGATIVE
Glucose, Urine, Semiquant: NEGATIVE
Ketones, urine, test strip: NEGATIVE
Nitrite: NEGATIVE
Protein, U semiquant: NEGATIVE
Urobilinogen, UA: 0.2
pH: 6

## 2007-05-17 IMAGING — CT CT ABDOMEN W/ CM
3 of 5 series · 14 of 32 positions shown, 18 images · IV contrast (40ML OMNI-MIX & 150ml omni/300%)
Comparison: Ultrasound [DATE].

CLINICAL DATA: Two weeks post-op for C-section with possible UTI.  Back pain, nausea.
ABDOMEN CT WITH CONTRAST ? [DATE]:
TECHNIQUE: Multidetector CT imaging of the abdomen was performed following the standard protocol during bolus administration of intravenous contrast. 
Contrast:  150 cc Omnipaque 300 IV.
TECHNIQUE: Multidetector CT imaging of the pelvis was performed following the standard protocol during bolus administration of intravenous contrast.

[Series 2: abd pelvis · axial · 0.70mm/px · z∈[-439,-229]mm · 3 of 85 slices shown, 7 images]
[im 22/85  soft-tissue]
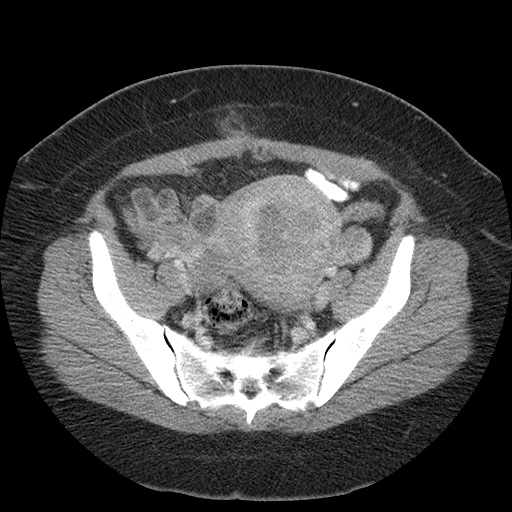
[im 22/85  lung]
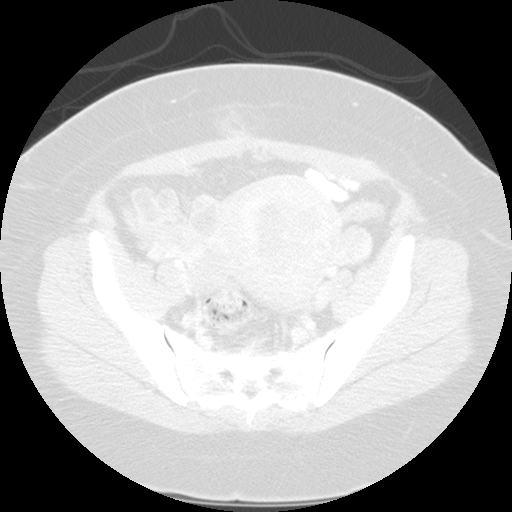
[im 22/85  bone]
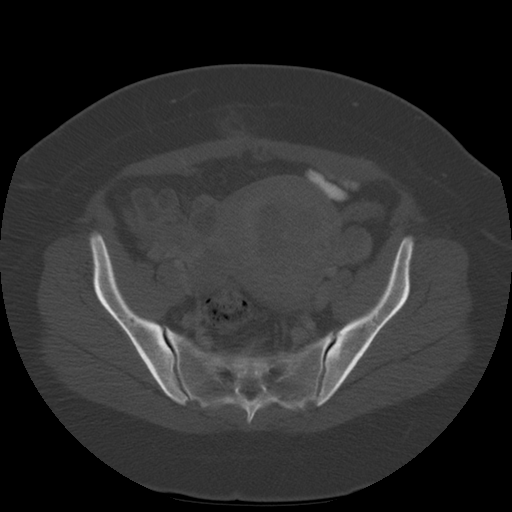
[im 43/85  soft-tissue]
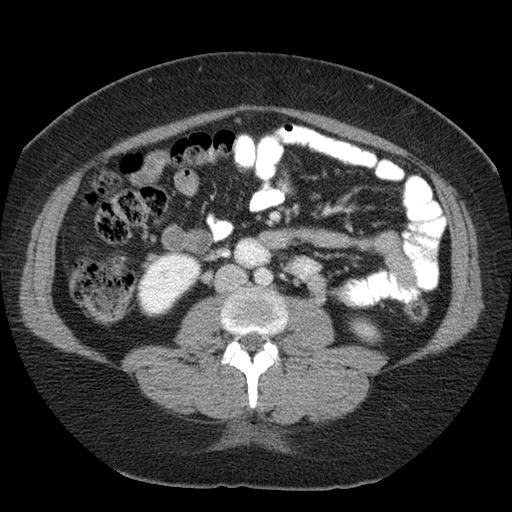
[im 43/85  lung]
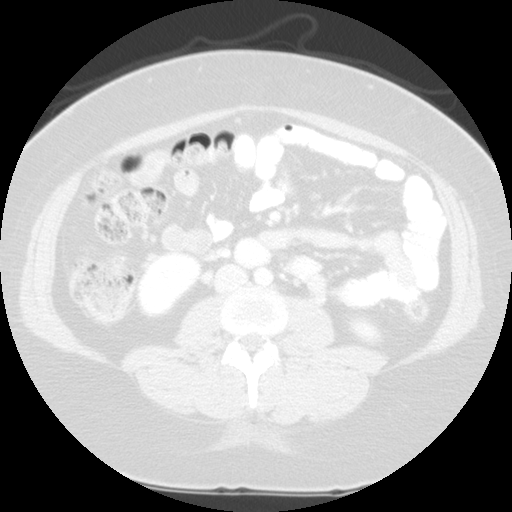
[im 64/85  soft-tissue]
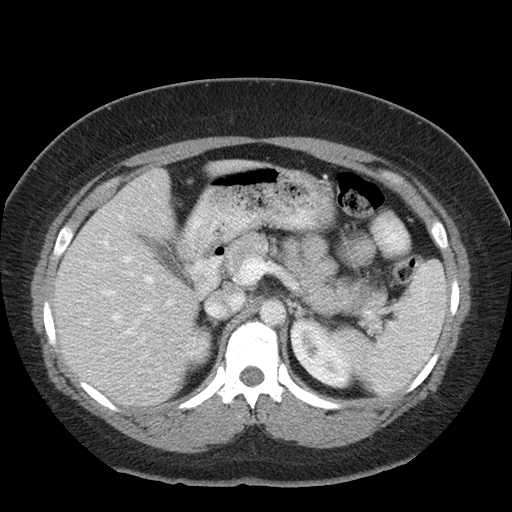
[im 64/85  lung]
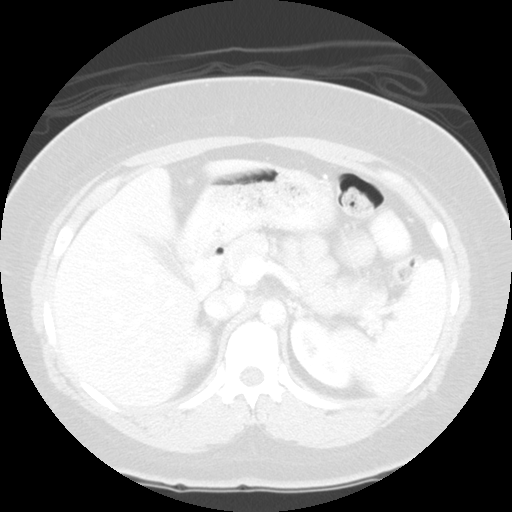

[Series 400: reformatted · coronal · 0.84mm/px · 3 of 162 slices shown (1 of 2)]
[im 17/162  soft-tissue]
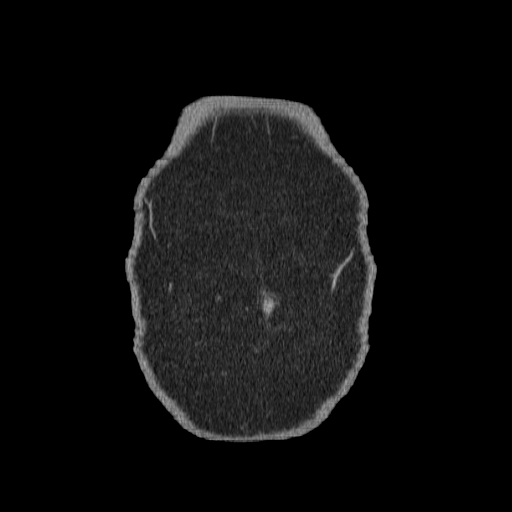
[im 33/162  soft-tissue]
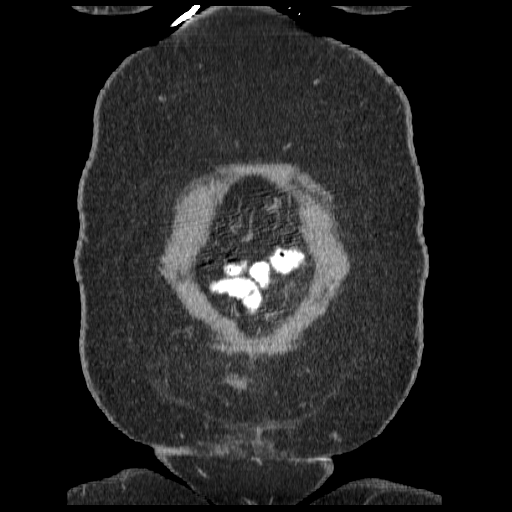
[im 49/162  soft-tissue]
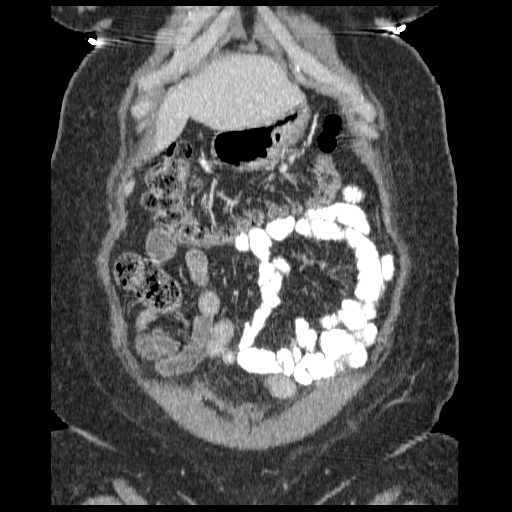

[Series 401: reformatted · sagittal · 0.84mm/px · 8 of 174 slices shown (2 of 2)]
[im 16/174  soft-tissue]
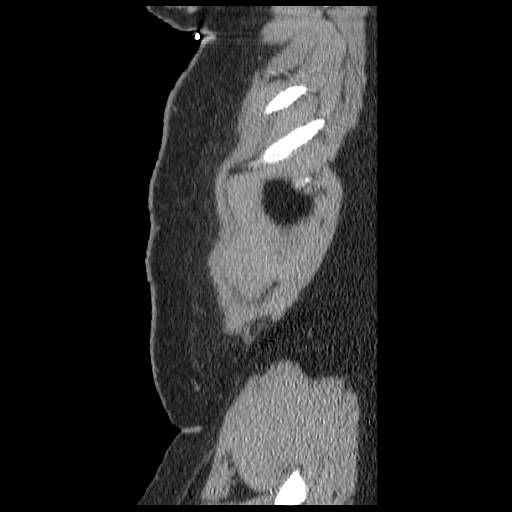
[im 32/174  soft-tissue]
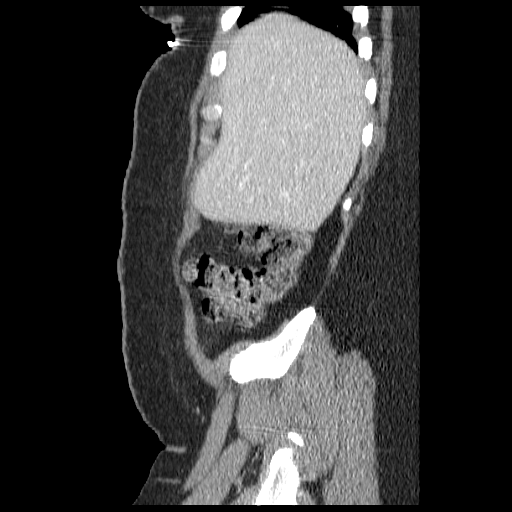
[im 63/174  soft-tissue]
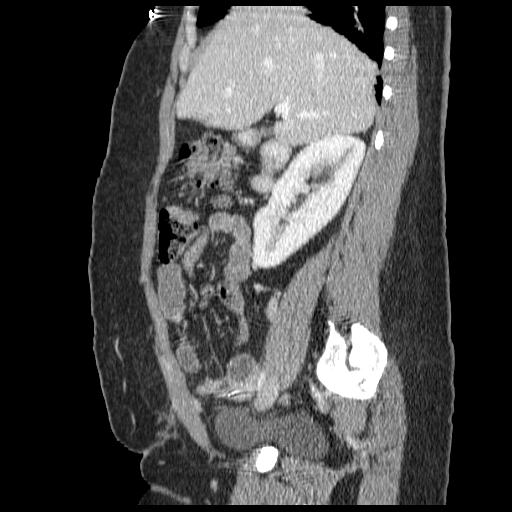
[im 79/174  soft-tissue]
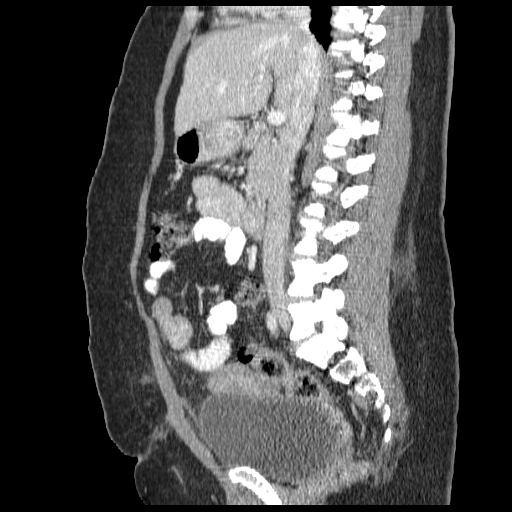
[im 95/174  soft-tissue]
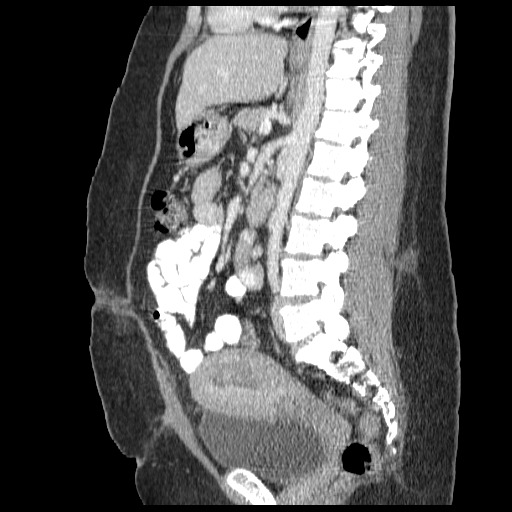
[im 111/174  soft-tissue]
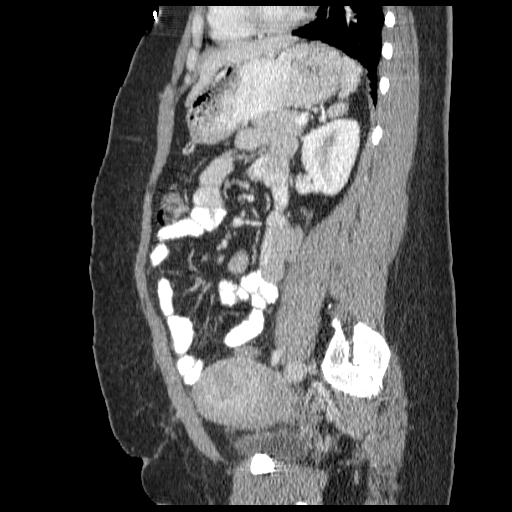
[im 142/174  soft-tissue]
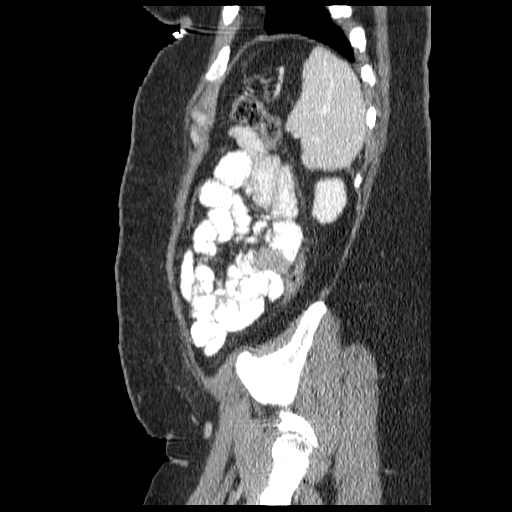
[im 158/174  soft-tissue]
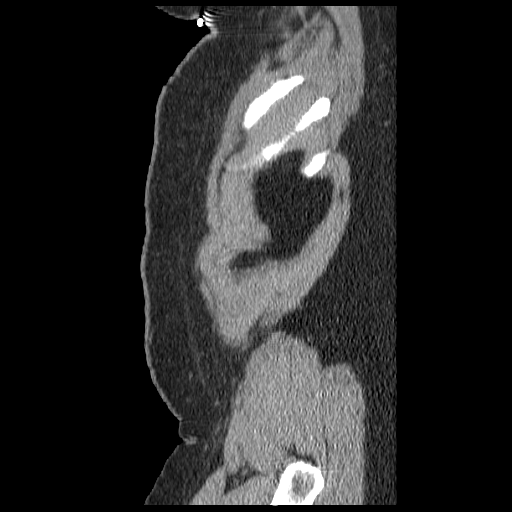

[14 of 32 positions shown; findings below may reference images not displayed]

FINDINGS: The imaged portion of the lung bases show minimal atelectasis.  Negative for effusion or consolidation.
The extreme dome of the liver is not completely included on these images.  The visualized portion of the liver is normal, without evidence of mass or biliary dilatation.  The liver is approximately 22 cm in length, consistent with hepatomegaly versus an anatomic variant (HUGO?HUGO lobe).  The gallbladder is nondistended.  There are some subcentimeter foci of increased density along the dependent portion of the gallbladder which likely represents multiple small gallstones. There is no evidence by CT of gallbladder wall thickening or pericholecystic fluid. The spleen is normal in size and enhancement.  
The adrenal glands, pancreas, and kidneys are normal.  There is no hydronephrosis.  On delayed images through the abdomen both kidneys excrete contrast symmetrically, without evidence of hydronephrosis.  There are no perinephric fluid collections.  Bowel loops are normal in caliber and there is no evidence of bowel wall thickening.  There is no free intraperitoneal air or abdominal ascites.  There is a moderate amount of stool in the colon. There is no evidence for bowel obstruction.  No acute bony abnormality is identified.
IMPRESSION: 1.  Cholelithiasis.  No CT evidence for cholecystitis.
2.  
PELVIS CT WITH CONTRAST ? [DATE]:
FINDINGS: The uterus is prominent in size, consistent with recent post partum state.  There is fluid in the endometrial canal, not unexpected.  There are postsurgical changes of bilateral tubal ligation.  There is no CT evidence of appendicitis.  The appendix is likely in the right pelvis (images #[id number]), and is normal in caliber. The urinary bladder is moderately distended but unremarkable.  There is no evidence of pelvic abscess, adenopathy, or free fluid.
IMPRESSION: 1.  Post partum uterus with fluid in the endometrial cavity, not unexpected.
2.  No definite acute findings in the pelvis.

## 2007-05-30 ENCOUNTER — Encounter (INDEPENDENT_AMBULATORY_CARE_PROVIDER_SITE_OTHER): Payer: Self-pay | Admitting: Family Medicine

## 2007-05-30 ENCOUNTER — Ambulatory Visit: Payer: Self-pay | Admitting: Family Medicine

## 2007-05-30 DIAGNOSIS — Z6841 Body Mass Index (BMI) 40.0 and over, adult: Secondary | ICD-10-CM | POA: Insufficient documentation

## 2007-05-30 DIAGNOSIS — N926 Irregular menstruation, unspecified: Secondary | ICD-10-CM | POA: Insufficient documentation

## 2007-05-30 DIAGNOSIS — E669 Obesity, unspecified: Secondary | ICD-10-CM

## 2007-05-30 LAB — CONVERTED CEMR LAB
AST: 16 units/L (ref 0–37)
Albumin: 4.3 g/dL (ref 3.5–5.2)
CO2: 23 meq/L (ref 19–32)
Creatinine, Ser: 0.51 mg/dL (ref 0.40–1.20)
HCT: 42.2 % (ref 36.0–46.0)
HDL: 43 mg/dL (ref >39)
MCHC: 32.7 g/dL (ref 30.0–36.0)
MCV: 86.3 fL (ref 78.0–100.0)
Potassium: 4.2 meq/L (ref 3.5–5.3)
Sodium: 141 meq/L (ref 135–145)
TSH: 1.323 microintl units/mL (ref 0.350–5.50)
Total Protein: 7.4 g/dL (ref 6.0–8.3)
Triglycerides: 165 mg/dL — ABNORMAL HIGH (ref <150)

## 2007-07-06 ENCOUNTER — Ambulatory Visit: Payer: Self-pay | Admitting: Family Medicine

## 2007-07-12 ENCOUNTER — Encounter: Admission: RE | Admit: 2007-07-12 | Discharge: 2007-07-12 | Payer: Self-pay | Admitting: Family Medicine

## 2007-08-18 ENCOUNTER — Encounter: Payer: Self-pay | Admitting: *Deleted

## 2007-09-06 ENCOUNTER — Encounter (INDEPENDENT_AMBULATORY_CARE_PROVIDER_SITE_OTHER): Payer: Self-pay | Admitting: *Deleted

## 2007-10-05 ENCOUNTER — Encounter: Payer: Self-pay | Admitting: *Deleted

## 2007-10-20 ENCOUNTER — Encounter: Payer: Self-pay | Admitting: *Deleted

## 2007-11-28 ENCOUNTER — Encounter: Payer: Self-pay | Admitting: *Deleted

## 2008-01-03 ENCOUNTER — Encounter: Payer: Self-pay | Admitting: Family Medicine

## 2008-01-03 ENCOUNTER — Ambulatory Visit: Payer: Self-pay | Admitting: Family Medicine

## 2008-05-10 ENCOUNTER — Ambulatory Visit: Payer: Self-pay | Admitting: Family Medicine

## 2008-05-24 ENCOUNTER — Emergency Department (HOSPITAL_COMMUNITY): Admission: EM | Admit: 2008-05-24 | Discharge: 2008-05-24 | Payer: Self-pay | Admitting: Family Medicine

## 2008-05-25 ENCOUNTER — Telehealth: Payer: Self-pay | Admitting: *Deleted

## 2008-10-30 ENCOUNTER — Telehealth: Payer: Self-pay | Admitting: Family Medicine

## 2008-11-14 ENCOUNTER — Ambulatory Visit: Payer: Self-pay | Admitting: Family Medicine

## 2008-11-14 ENCOUNTER — Encounter: Payer: Self-pay | Admitting: Family Medicine

## 2008-11-18 ENCOUNTER — Emergency Department (HOSPITAL_COMMUNITY): Admission: EM | Admit: 2008-11-18 | Discharge: 2008-11-19 | Payer: Self-pay | Admitting: *Deleted

## 2008-11-19 IMAGING — CR DG ELBOW COMPLETE 3+V*R*
4 series · 4 of 4 positions shown · non-contrast
Comparison: None.

CLINICAL DATA: Trauma.  Lateral elbow pain.

RIGHT ELBOW - COMPLETE 3+ VIEW

[x elbow joint ap right]
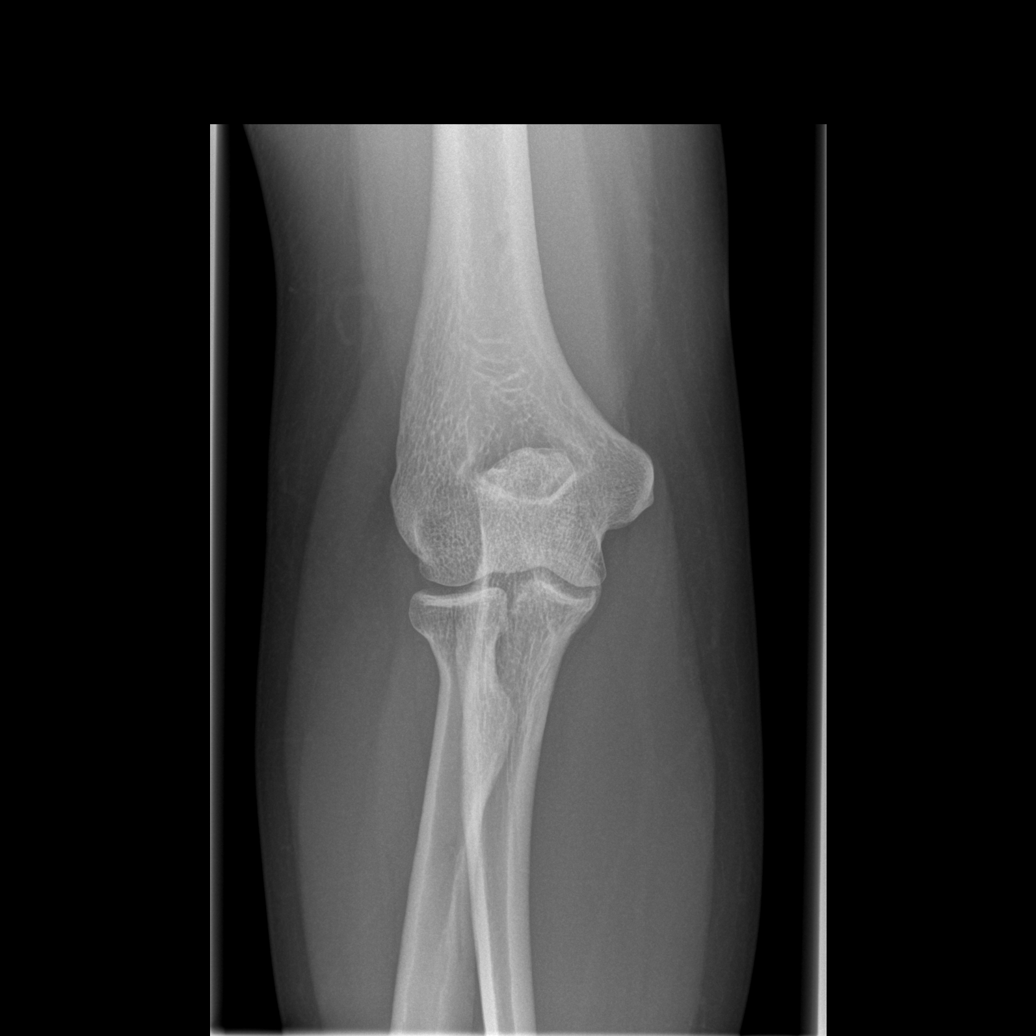

[x elbow joint obl. right (1 of 2)]
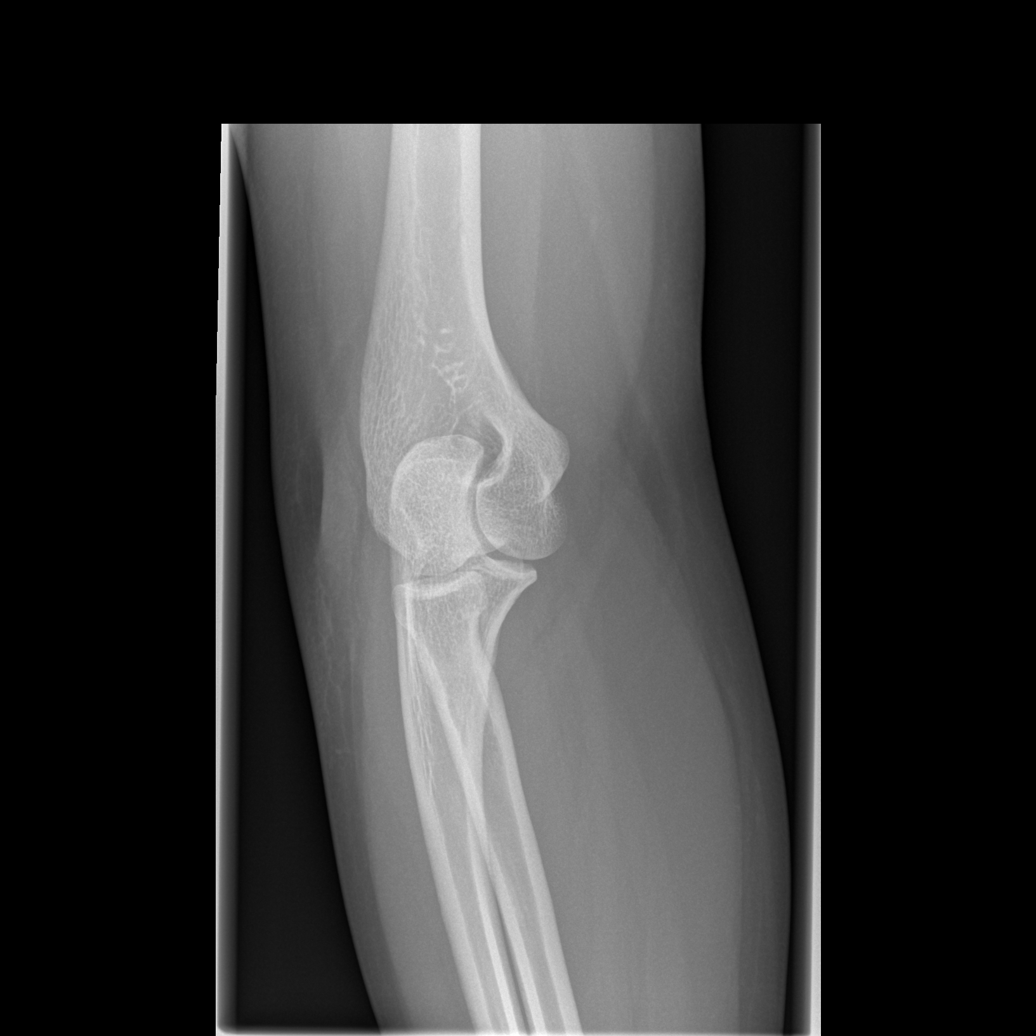

[x elbow joint obl. right (2 of 2)]
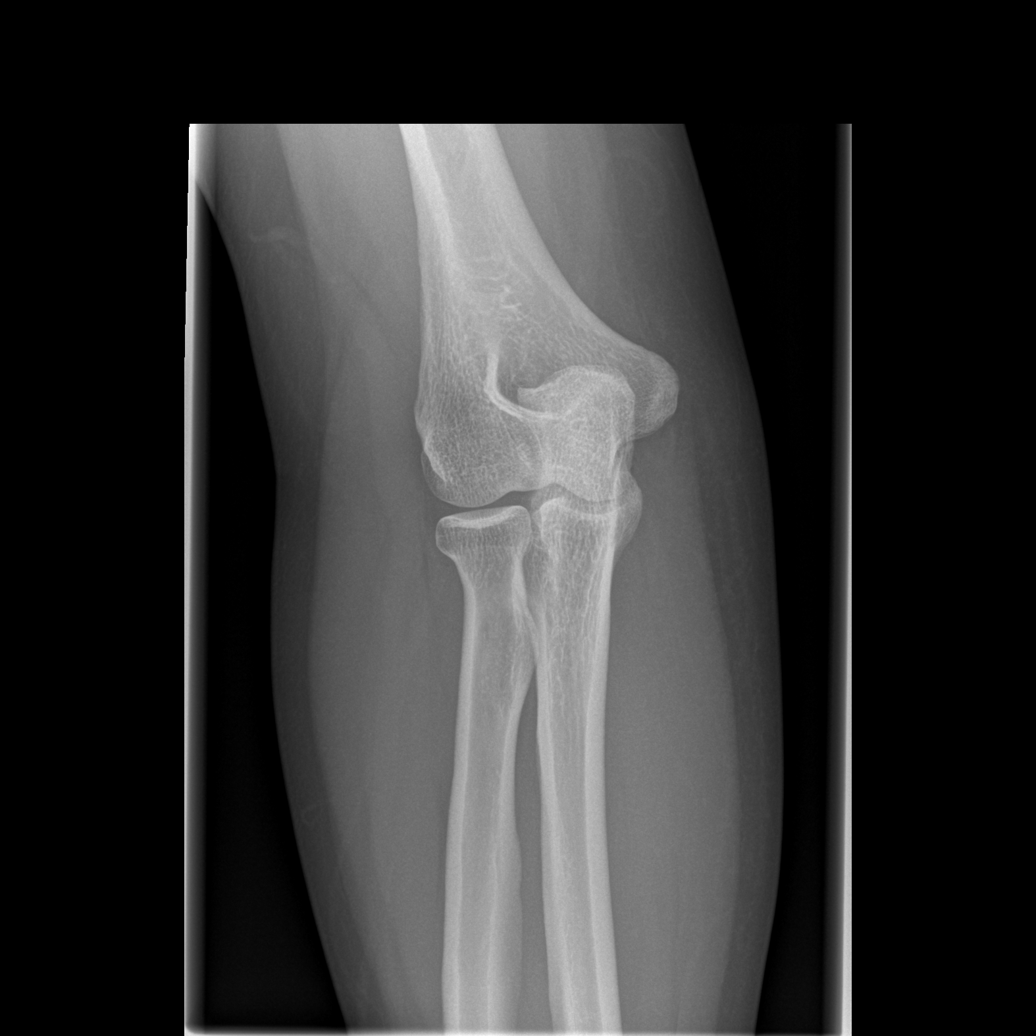

[x elbow joint lat right]
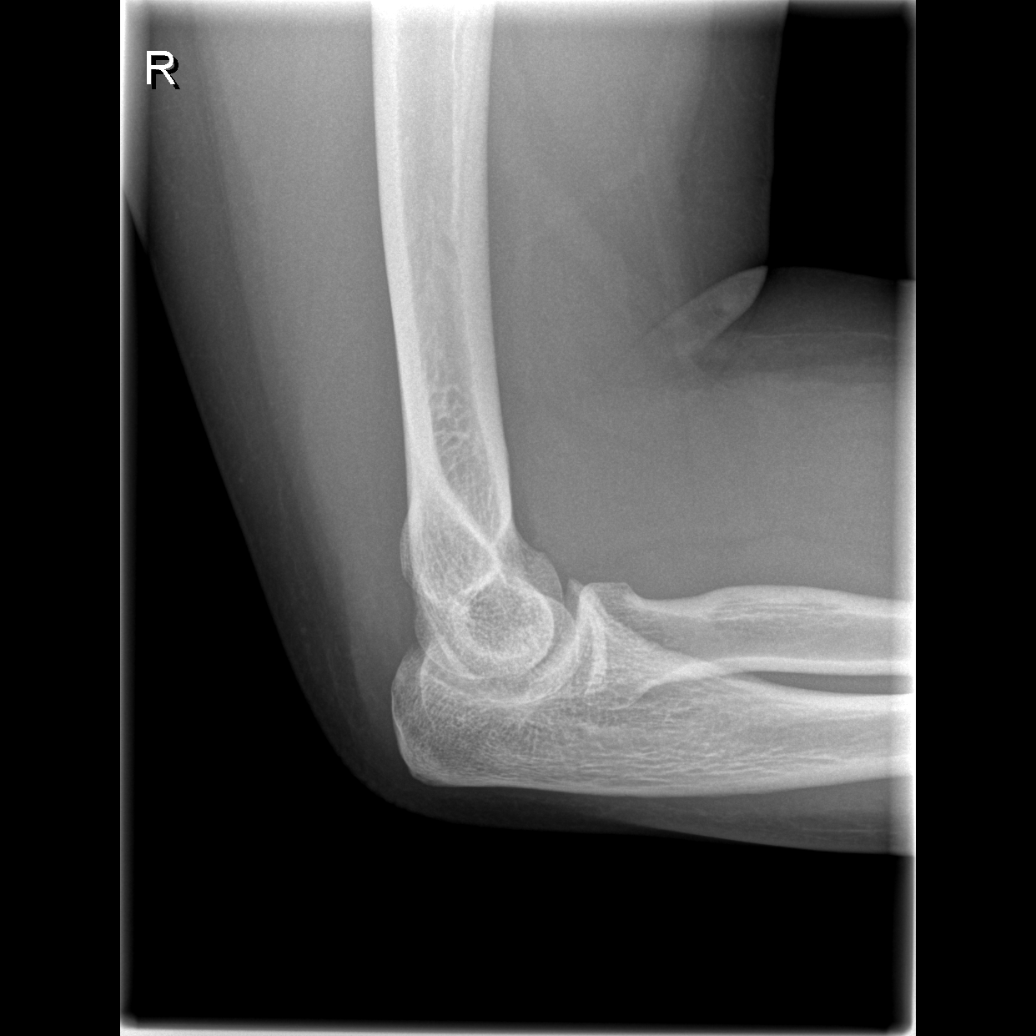

[4 of 4 positions shown; findings below may reference images not displayed]

FINDINGS: No evidence for fracture.  No subluxation or dislocation.
No fat pad elevation to suggest joint effusion.
IMPRESSION: No acute bony abnormality.

## 2008-11-27 ENCOUNTER — Ambulatory Visit: Payer: Self-pay | Admitting: Family Medicine

## 2008-11-27 DIAGNOSIS — H547 Unspecified visual loss: Secondary | ICD-10-CM | POA: Insufficient documentation

## 2009-02-05 ENCOUNTER — Ambulatory Visit: Payer: Self-pay | Admitting: Family Medicine

## 2009-02-05 DIAGNOSIS — F329 Major depressive disorder, single episode, unspecified: Secondary | ICD-10-CM | POA: Insufficient documentation

## 2009-02-05 HISTORY — DX: Major depressive disorder, single episode, unspecified: F32.9

## 2009-05-14 ENCOUNTER — Ambulatory Visit: Payer: Self-pay | Admitting: Family Medicine

## 2009-05-15 ENCOUNTER — Encounter: Payer: Self-pay | Admitting: Family Medicine

## 2009-05-15 ENCOUNTER — Emergency Department (HOSPITAL_COMMUNITY): Admission: EM | Admit: 2009-05-15 | Discharge: 2009-05-15 | Payer: Self-pay | Admitting: Family Medicine

## 2009-05-15 IMAGING — CR DG NASAL BONES 3+V
2 series · 2 of 2 positions shown · non-contrast
Comparison: None.

CLINICAL DATA: Fall with nasal pain.

NASAL BONES - 3+ VIEW

[view not recorded (1 of 2)]
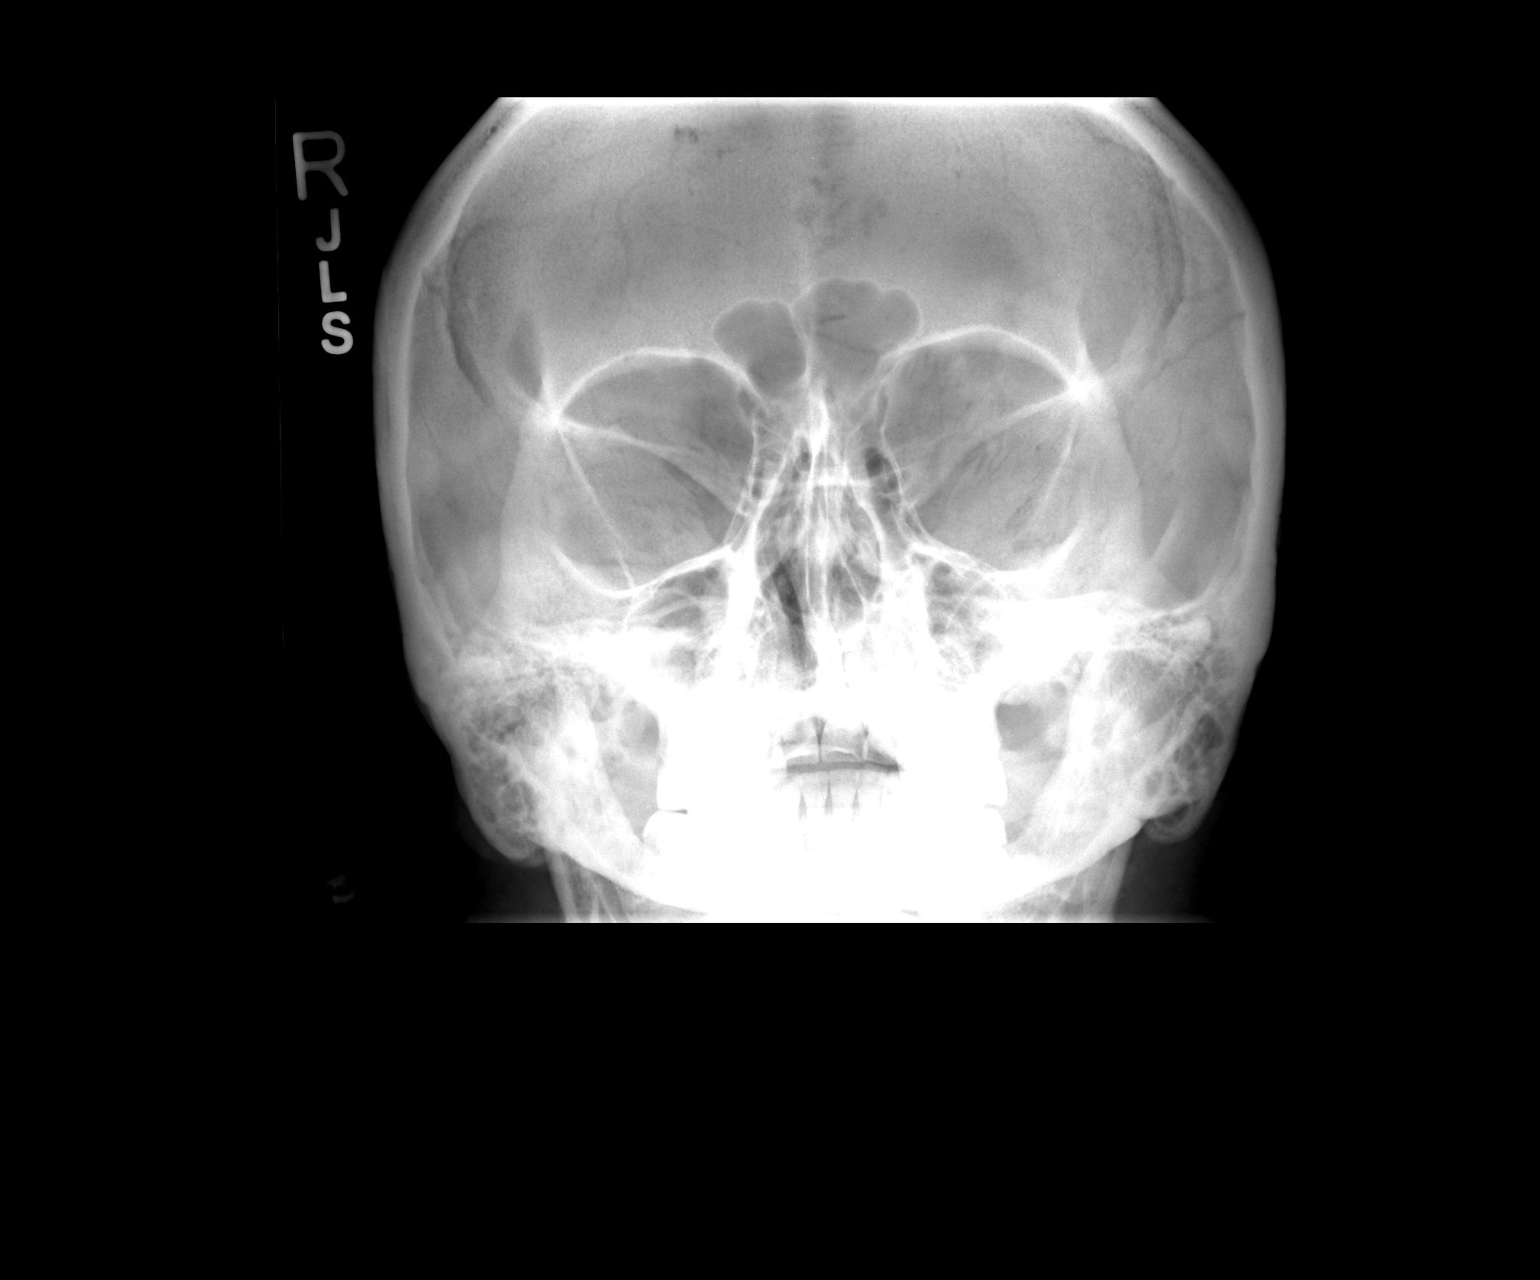

[view not recorded (2 of 2)]
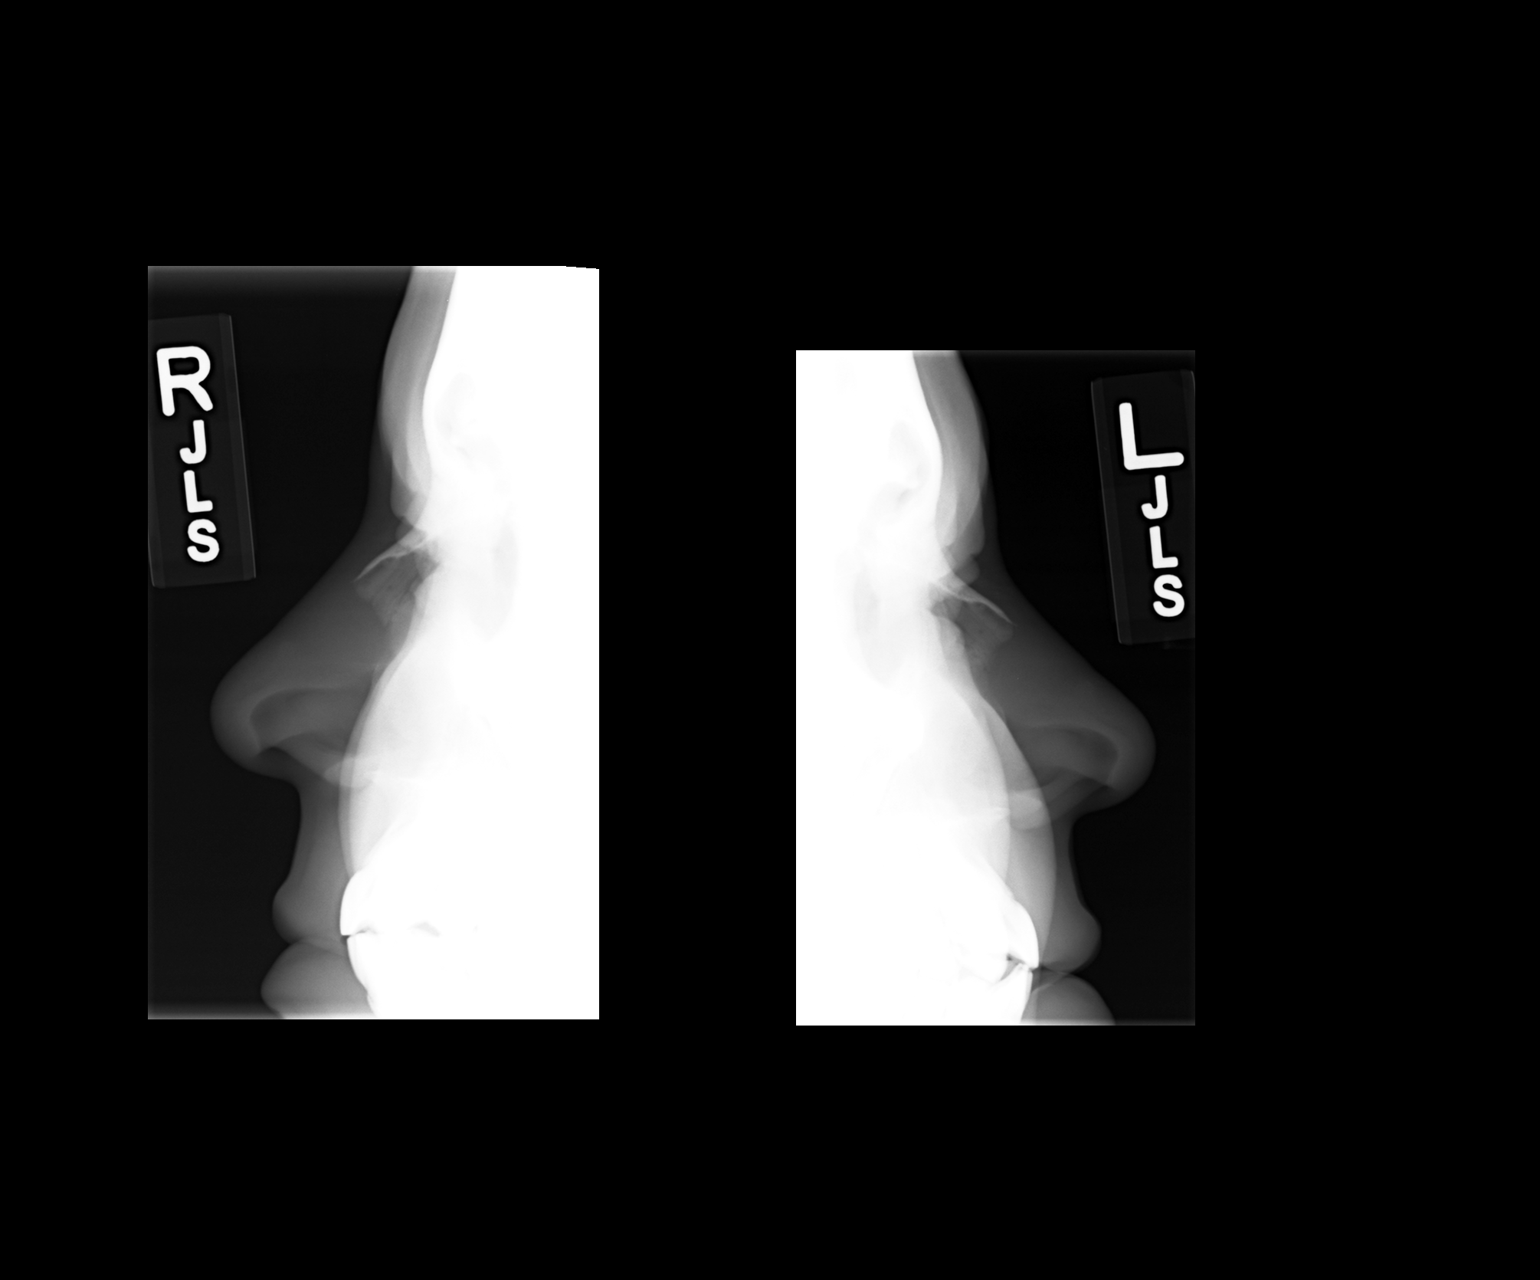

[2 of 2 positions shown; findings below may reference images not displayed]

FINDINGS: Negative for nasal bone fracture.  The orbit is intact
and the sinuses are clear.
IMPRESSION: Negative for fracture.

## 2009-06-08 HISTORY — PX: CHOLECYSTECTOMY: SHX55

## 2009-06-13 ENCOUNTER — Telehealth: Payer: Self-pay | Admitting: *Deleted

## 2009-07-02 ENCOUNTER — Ambulatory Visit: Payer: Self-pay | Admitting: Family Medicine

## 2009-07-22 ENCOUNTER — Ambulatory Visit: Payer: Self-pay | Admitting: Family Medicine

## 2009-08-14 ENCOUNTER — Ambulatory Visit: Payer: Self-pay | Admitting: Family Medicine

## 2009-08-14 DIAGNOSIS — K219 Gastro-esophageal reflux disease without esophagitis: Secondary | ICD-10-CM | POA: Insufficient documentation

## 2009-10-18 ENCOUNTER — Ambulatory Visit: Payer: Self-pay | Admitting: Family Medicine

## 2009-10-18 ENCOUNTER — Encounter: Payer: Self-pay | Admitting: Family Medicine

## 2009-10-18 ENCOUNTER — Encounter (INDEPENDENT_AMBULATORY_CARE_PROVIDER_SITE_OTHER): Payer: Self-pay | Admitting: Family Medicine

## 2009-12-04 ENCOUNTER — Inpatient Hospital Stay (HOSPITAL_COMMUNITY): Admission: EM | Admit: 2009-12-04 | Discharge: 2009-12-07 | Payer: Self-pay | Admitting: Emergency Medicine

## 2009-12-04 ENCOUNTER — Emergency Department (HOSPITAL_COMMUNITY): Admission: EM | Admit: 2009-12-04 | Discharge: 2009-12-04 | Payer: Self-pay | Admitting: Emergency Medicine

## 2009-12-04 IMAGING — US US ABDOMEN COMPLETE
1 series · 14 of 25 positions shown · non-contrast
Comparison: CT [DATE]

CLINICAL DATA: Abdominal pain

COMPLETE ABDOMINAL ULTRASOUND

[Series 1: us abdomen complete · 0.30mm/px · 14 of 55 slices shown]
[im 1/55]
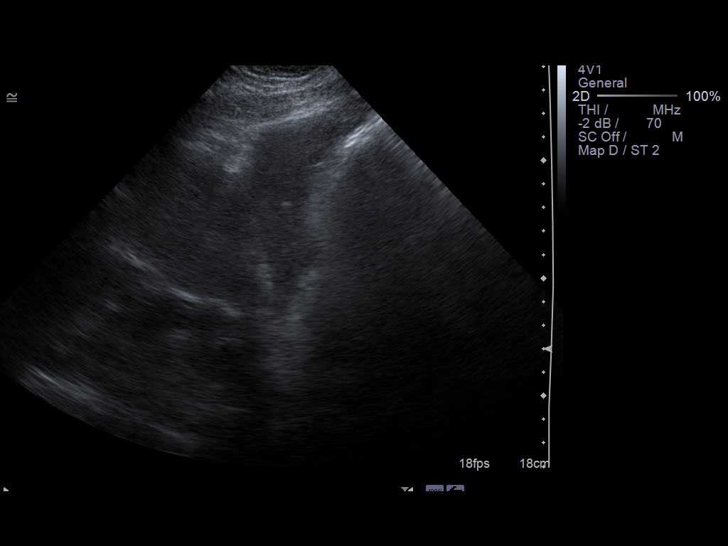
[im 5/55]
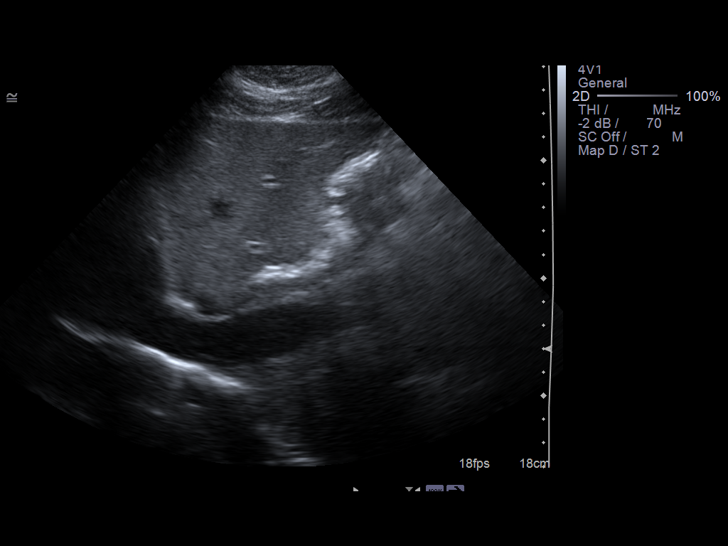
[im 10/55]
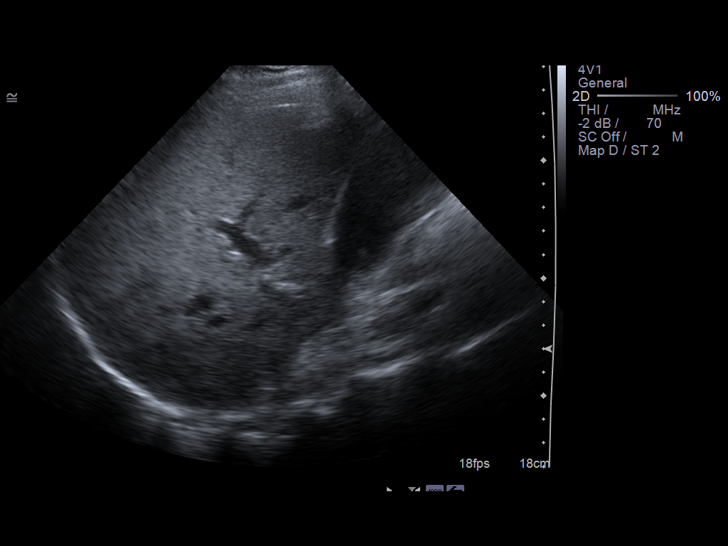
[im 14/55]
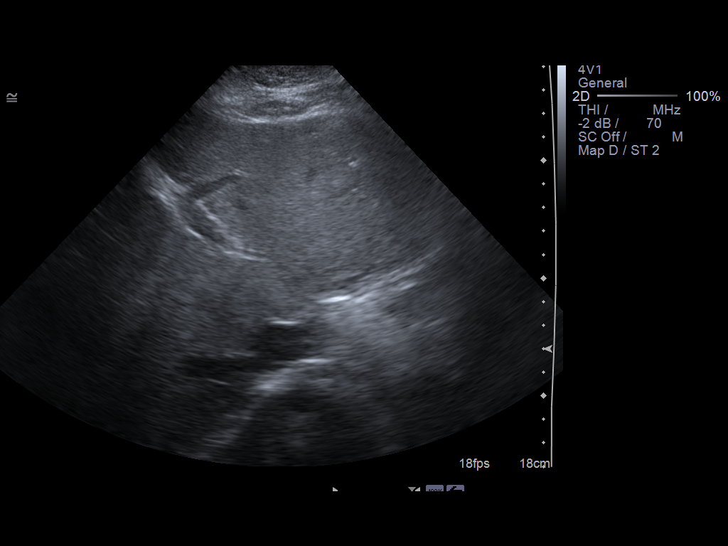
[im 19/55]
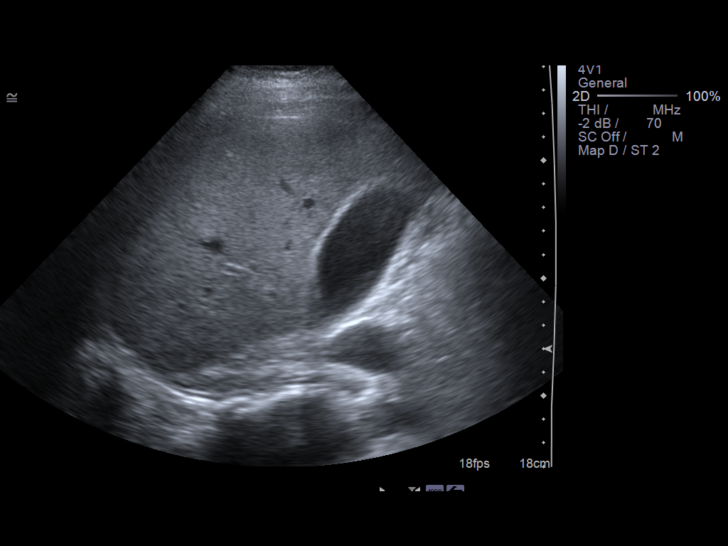
[im 21/55]
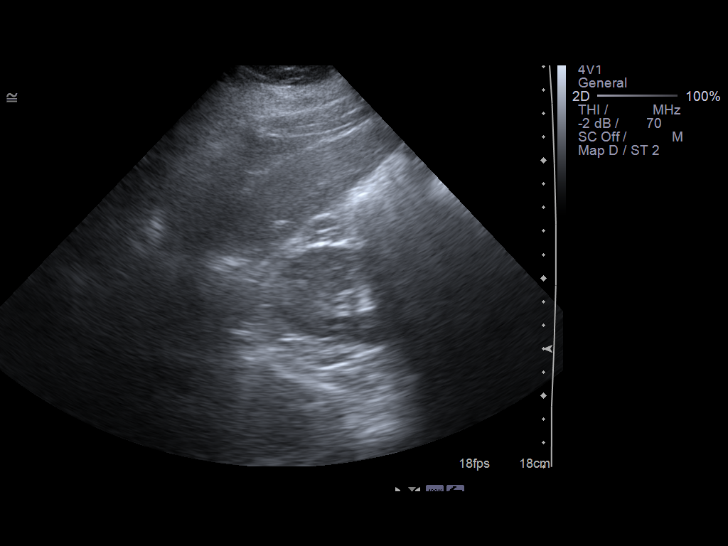
[im 25/55]
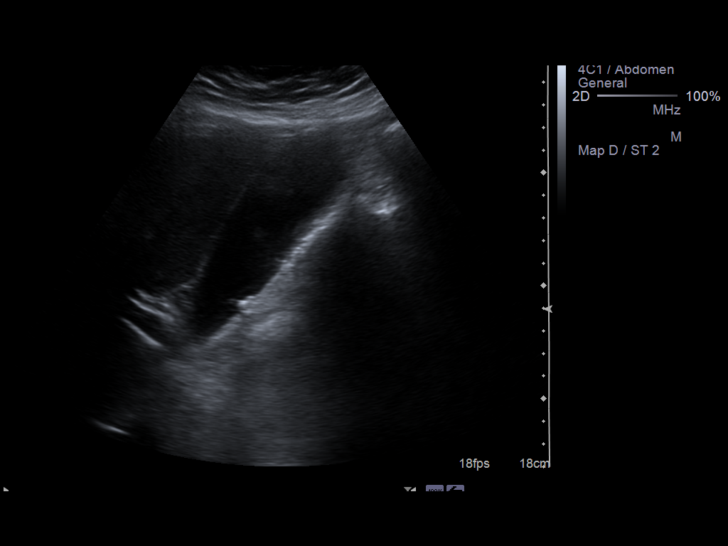
[im 30/55]
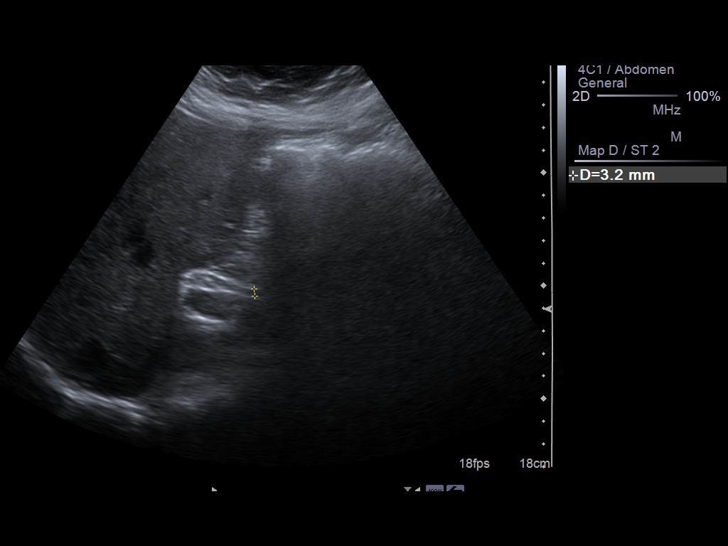
[im 34/55]
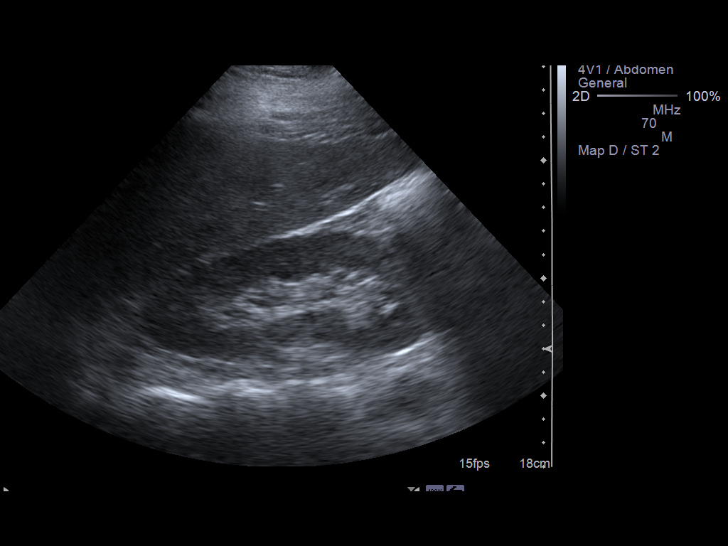
[im 37/55]
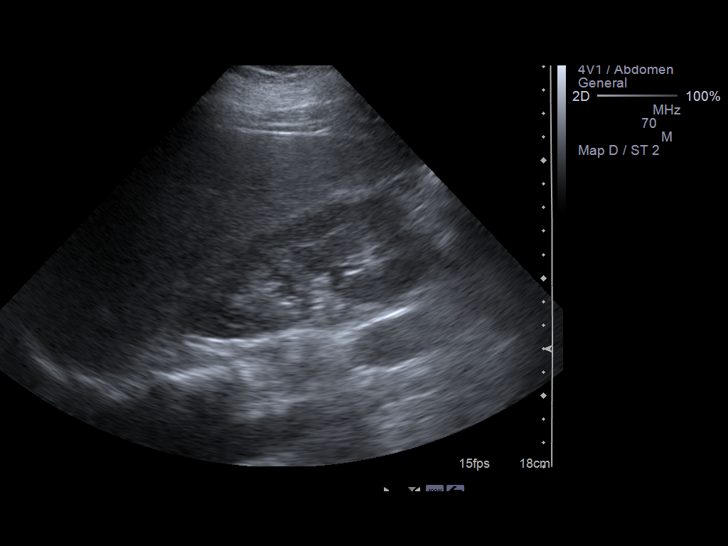
[im 41/55]
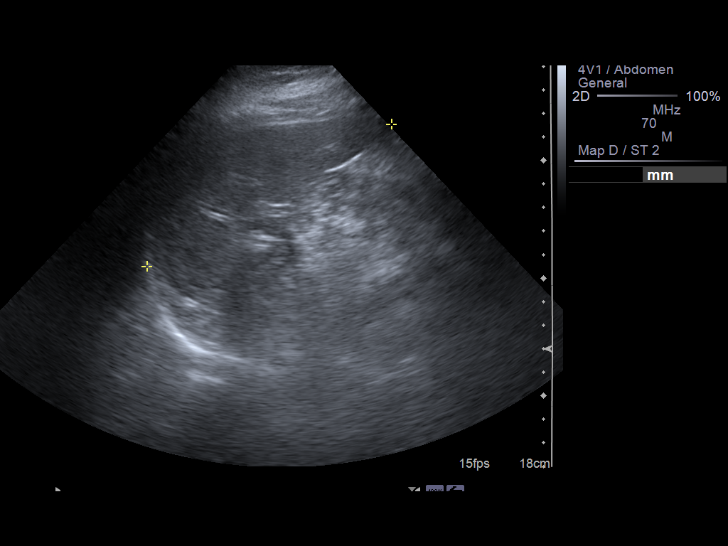
[im 46/55]
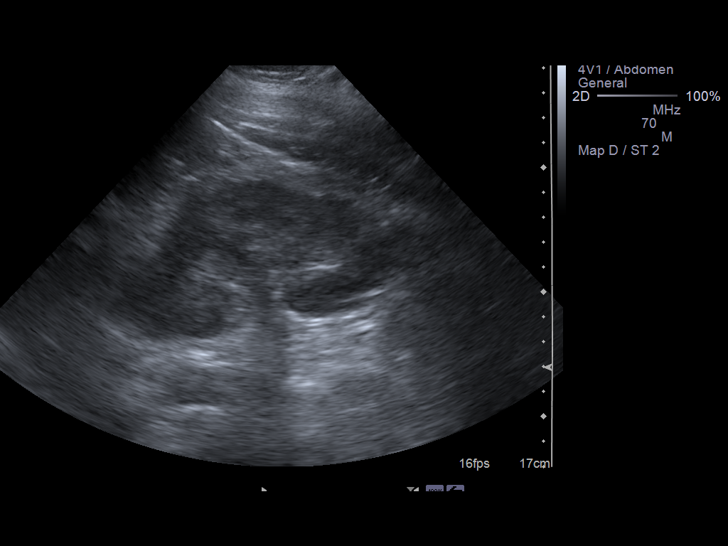
[im 50/55]
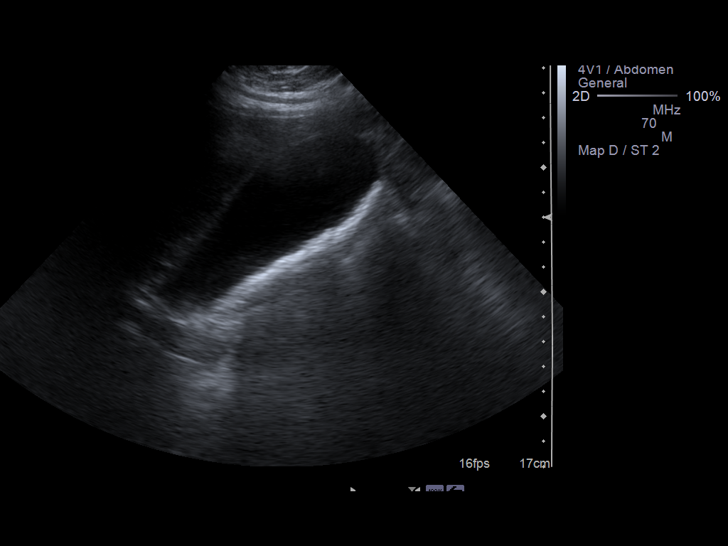
[im 55/55]
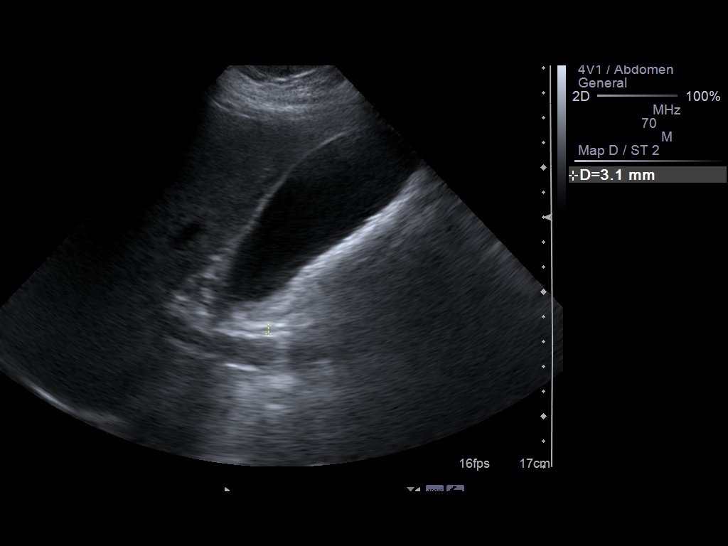

[14 of 25 positions shown; findings below may reference images not displayed]

FINDINGS: Gallbladder:  Small layering stones and sludge seen within the
gallbladder.  No wall thickening.  Negative sonographic MANSON.

Common bile duct:   Normal, 3 mm.

Liver:  No focal lesion identified.  Within normal limits in
parenchymal echogenicity.

IVC:  Appears normal.

Pancreas:  Difficult to visualize due to overlying bowel gas.

Spleen:  Within normal limits in size and echotexture.

Right Kidney:   Normal in size and parenchymal echogenicity.  No
evidence of mass or hydronephrosis.

Left Kidney:  Normal in size and parenchymal echogenicity.  No
evidence of mass or hydronephrosis.

Abdominal aorta:  Limited visualization.  No evidence of aneurysm.
IMPRESSION: Small layering gallstones.  No acute cholecystitis changes.

## 2009-12-05 ENCOUNTER — Encounter (INDEPENDENT_AMBULATORY_CARE_PROVIDER_SITE_OTHER): Payer: Self-pay | Admitting: Surgery

## 2009-12-05 IMAGING — RF DG CHOLANGIOGRAM OPERATIVE
1 series · 4 of 4 positions shown · non-contrast
Comparison: Abdomen ultrasound dated [DATE].

CLINICAL DATA: Cholelithiasis and cholecystitis.

INTRAOPERATIVE CHOLANGIOGRAM
TECHNIQUE: Multiple fluoroscopic spot radiographs were obtained
during intraoperative cholangiogram and are submitted for
interpretation post-operatively.

[Series 1: run · 4 of 33 frames shown]
[frame 4/33]
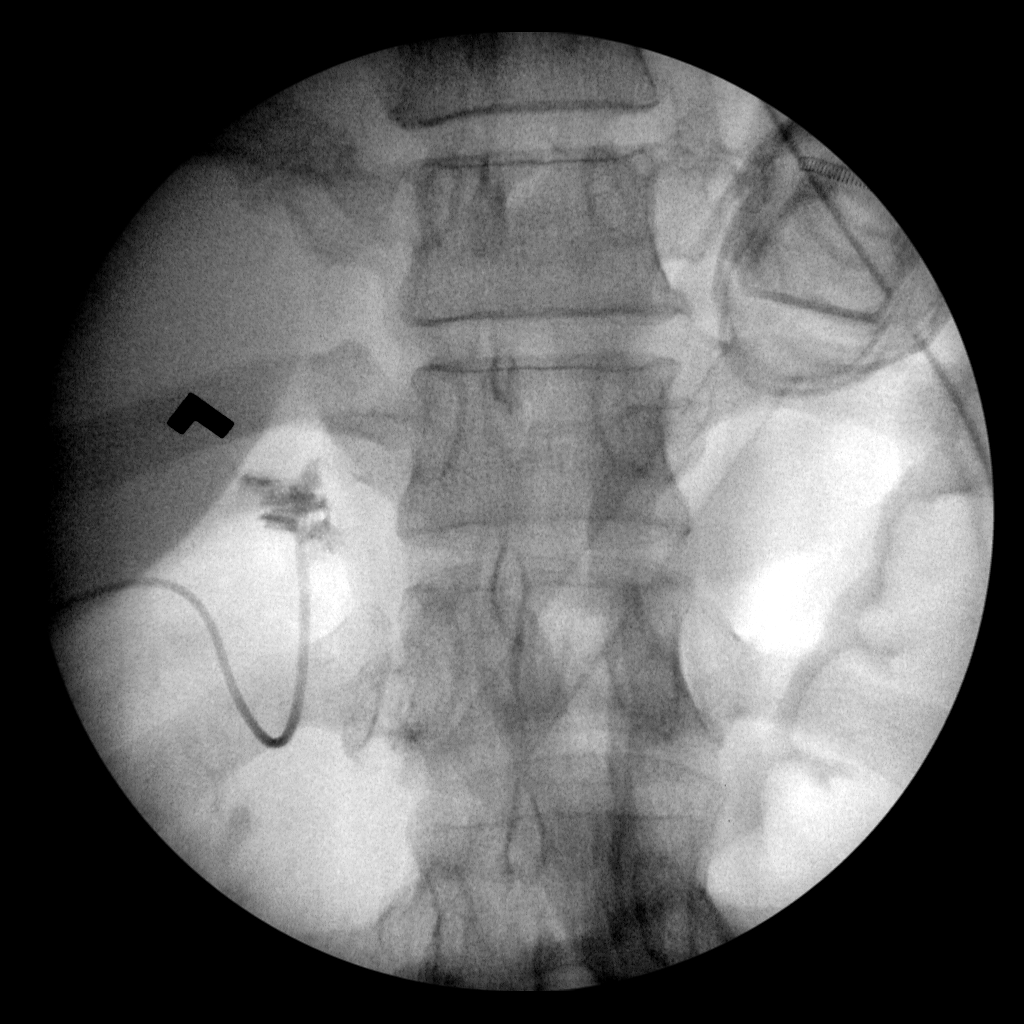
[frame 5/33]
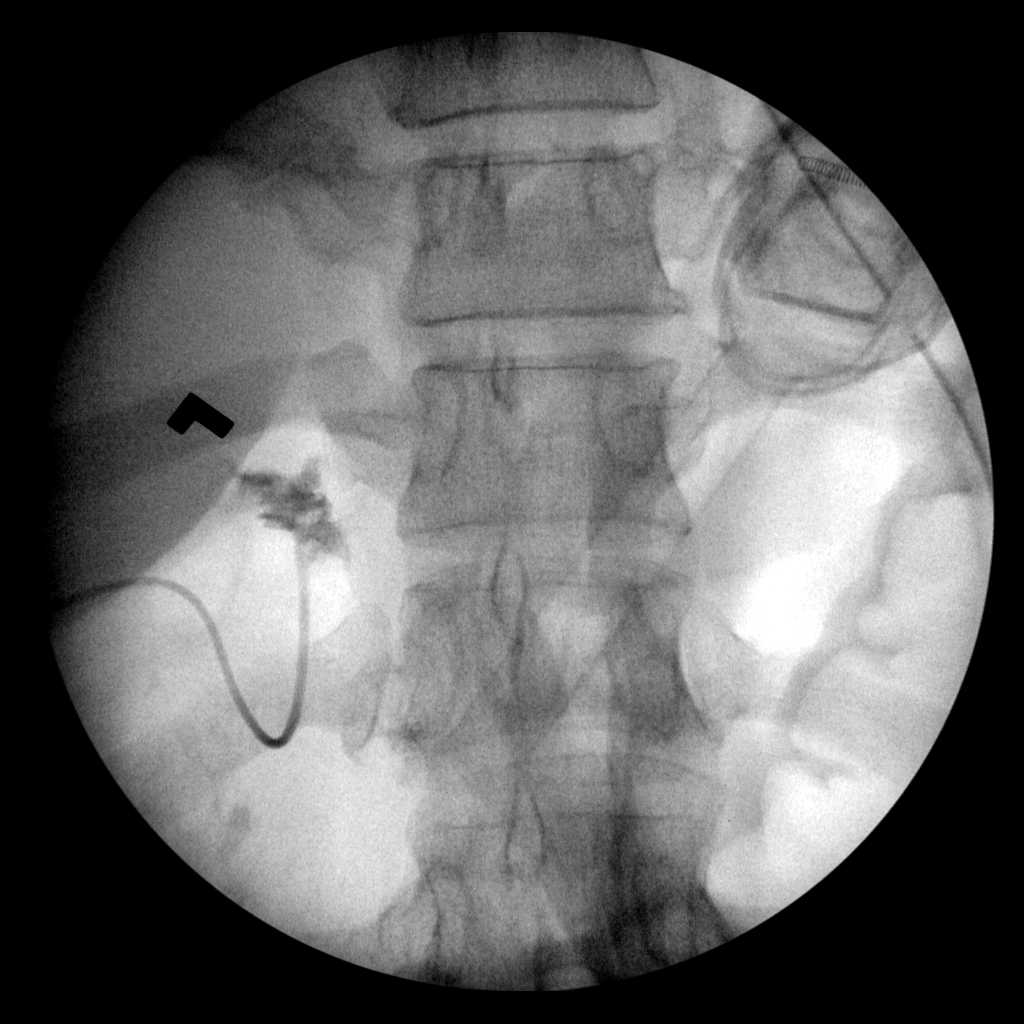
[frame 17/33]
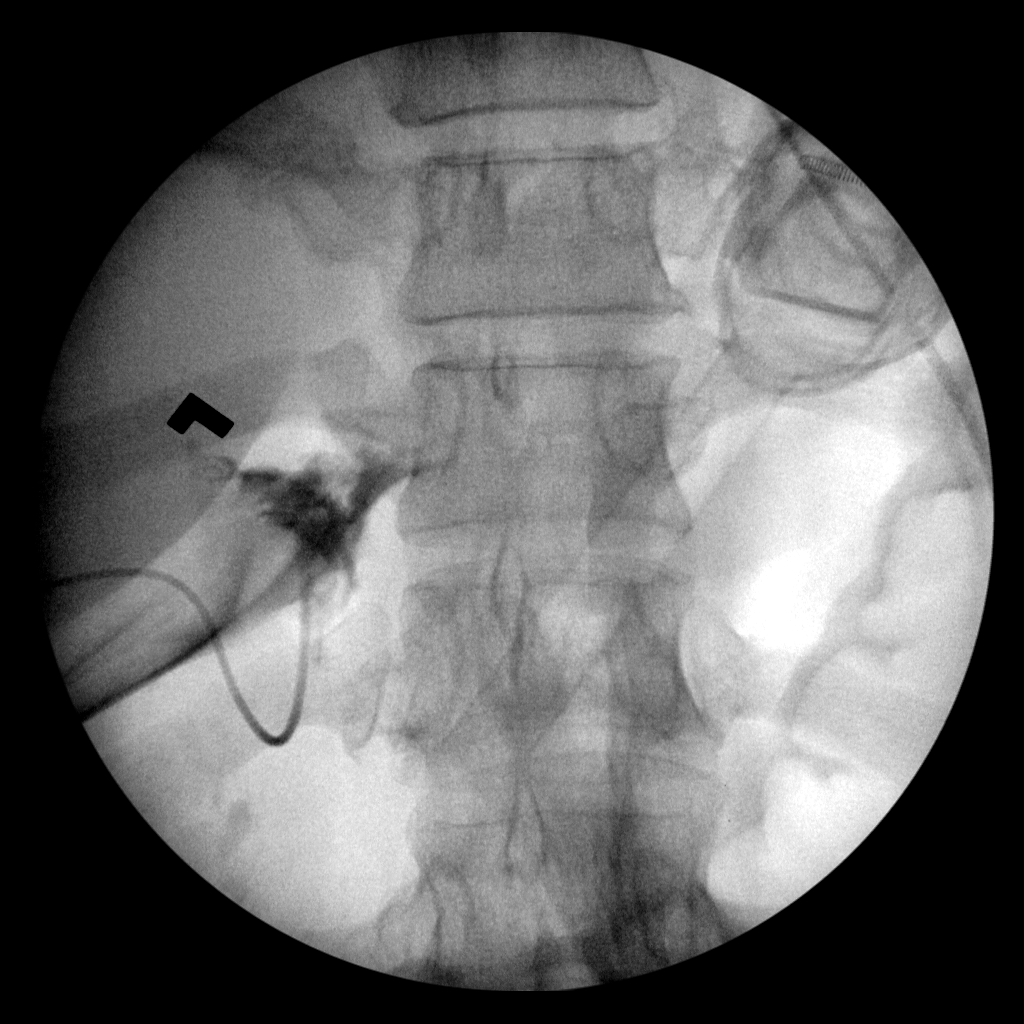
[frame 29/33]
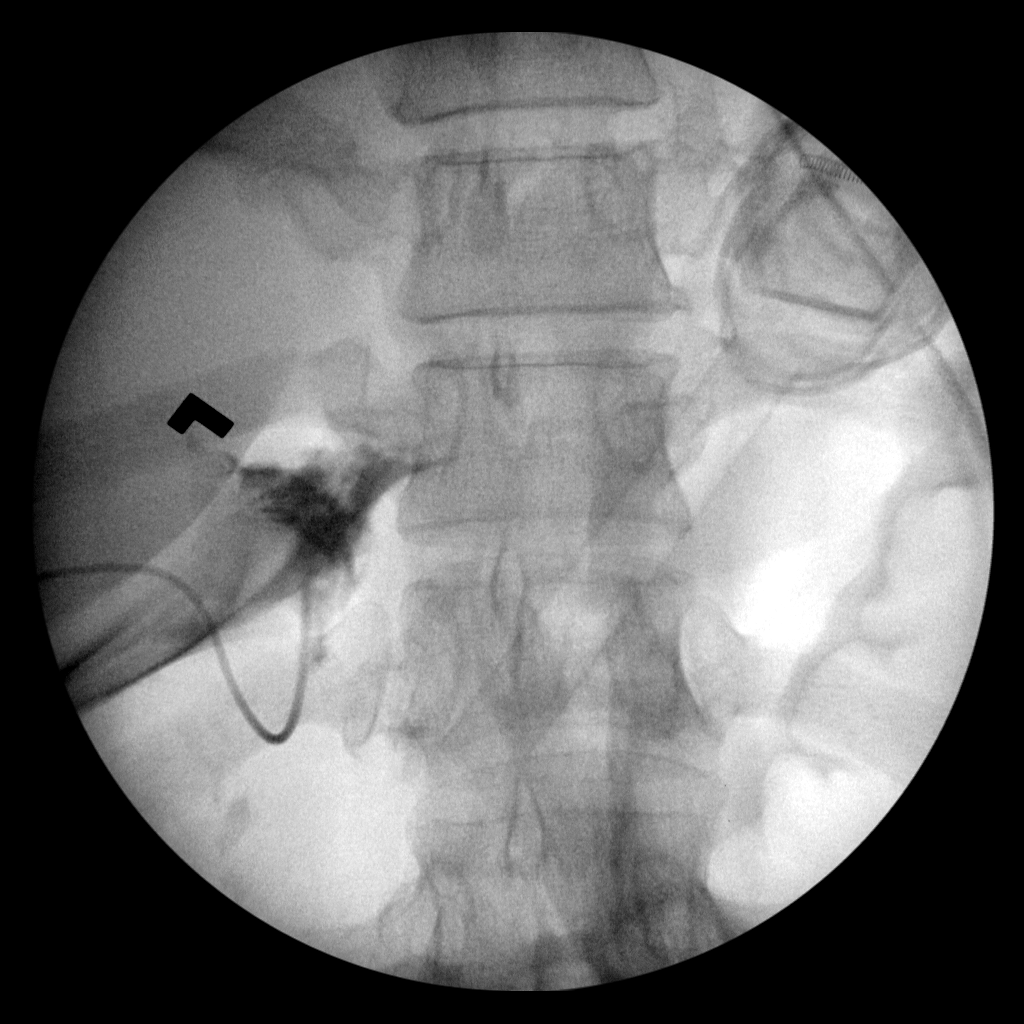

[4 of 4 positions shown; findings below may reference images not displayed]

FINDINGS: Multiple c-arm views of the right upper quadrant of the
abdomen demonstrate cholecystectomy clips and a catheter.  Contrast
injection through the catheter is demonstrated with extravasation
of the contrast into the gallbladder fossa and subhepatic region.
No opacification of the cystic duct or common duct is demonstrated.
IMPRESSION: Nondiagnostic extraluminal contrast injection.

These images were submitted for radiologic interpretation only.
Please see the procedural report for the amount of contrast and the
fluoroscopy time utilized.

## 2010-02-05 ENCOUNTER — Encounter: Payer: Self-pay | Admitting: Family Medicine

## 2010-02-26 ENCOUNTER — Encounter: Payer: Self-pay | Admitting: Family Medicine

## 2010-02-26 ENCOUNTER — Ambulatory Visit: Payer: Self-pay | Admitting: Family Medicine

## 2010-02-26 LAB — CONVERTED CEMR LAB
AST: 15 units/L (ref 0–37)
BUN: 13 mg/dL (ref 6–23)
CO2: 20 meq/L (ref 19–32)
Chloride: 106 meq/L (ref 96–112)
Creatinine, Ser: 0.51 mg/dL (ref 0.40–1.20)
Glucose, Bld: 92 mg/dL (ref 70–99)
HDL: 46 mg/dL (ref >39)
Potassium: 4.1 meq/L (ref 3.5–5.3)
Sodium: 139 meq/L (ref 135–145)
Total CHOL/HDL Ratio: 3.1
Total Protein: 7.3 g/dL (ref 6.0–8.3)

## 2010-02-28 ENCOUNTER — Encounter: Payer: Self-pay | Admitting: Family Medicine

## 2010-05-07 ENCOUNTER — Ambulatory Visit: Payer: Self-pay | Admitting: Family Medicine

## 2010-06-29 ENCOUNTER — Encounter: Payer: Self-pay | Admitting: *Deleted

## 2010-07-10 NOTE — Letter (Signed)
Summary: Work Excuse  [facility]  [address]   [place], [place] [address]   Phone: [phone]  Fax: [phone]    Today's Date: Oct 18, 2009  Name of Patient: [facility]  The above named patient had a medical visit today at:  2:00 pm.  Please take this into consideration when reviewing the time away from work.    Special Instructions:  To be off the remainder of today, returning to the normal work on Monday, 10/21/09    Sincerely yours,   Jettie Pagan MD

## 2010-07-10 NOTE — Assessment & Plan Note (Signed)
Summary: GERD eo   Vital Signs:  Patient profile:   35 year old female Height:      61.5 inches Weight:      217 pounds BMI:     40.48 BSA:     1.97 Temp:     98.7 degrees F Pulse rate:   72 / minute BP sitting:   121 / 76  Vitals Entered By: Jone Baseman CMA (August 14, 2009 4:56 PM) CC: heartburn Is Patient Diabetic? No Pain Assessment Patient in pain? no        Primary Care Provider:  Bobby Rumpf  MD  CC:  heartburn.  History of Present Illness: 1) Heartburn: Patient reports substernal and epigastric burning worse with lying flat, spicy foods, sodas, acidic foods x 2 weeks. Pain occurs after eating. Reports some sour taste in mouth at times. Has tried Tums, PeptoBismol, Alka Seltzer without relief. Reports some nausea, denies emesis, melena, hematochezia, dyspnea, hematemesis, pain with movement or activity.   Habits & Providers  Alcohol-Tobacco-Diet     Tobacco Status: never  Current Medications (verified): 1)  Albuterol 90 Mcg/act  Aers (Albuterol) .... 2 Puffs Q4h As Needed Wheezing 2)  Qvar 40 Mcg/act  Aers (Beclomethasone Dipropionate) .Marland Kitchen.. 1 Puff Two Times A Day (Use Every Day As A Controler - Use Even When Not Wheezing) 3)  Trazodone Hcl 50 Mg Tabs (Trazodone Hcl) .... Take One Pill By Mouth At Night As Needed For Sleep (Instructions in Spanish Please) 4)  Ranitidine Hcl 150 Mg Tabs (Ranitidine Hcl) .... One Tab By Mouth Two Times A Day  Allergies (verified): No Known Drug Allergies  Physical Exam  General:  obese, pleasant NAD  Mouth:  pharynx pink and moist, no erythema, and no exudates.   Chest Wall:  no chest wall tenderness  Lungs:  CTAB Heart:  Normal rate and regular rhythm. S1 and S2 normal without gallop, murmur, click, rub or other extra sounds. Abdomen:  soft, non-tender, normal bowel sounds, no masses, no guarding, no rigidity, and no rebound tenderness.   Neurologic:  alert & oriented X3.     Impression & Recommendations:  Problem #  1:  GASTROESOPHAGEAL REFLUX DISEASE (ICD-530.81) Assessment New  Will start ranitidine for cost. Will have trial of PPI if not improving. Consider H pylori testing if symptoms not improving with trial of H2 blocker or PPI. No red flags on history or exam. Will follow in one month. Advised regarding avoiding triggers. Advised regarding weight loss.  Her updated medication list for this problem includes:    Ranitidine Hcl 150 Mg Tabs (Ranitidine hcl) ..... One tab by mouth two times a day  Orders: [facility]- Est Level  3 (86578)  Complete Medication List: 1)  Albuterol 90 Mcg/act Aers (Albuterol) .... 2 puffs q4h as needed wheezing 2)  Qvar 40 Mcg/act Aers (Beclomethasone dipropionate) .Marland Kitchen.. 1 puff two times a day (use every day as a controler - use even when not wheezing) 3)  Trazodone Hcl 50 Mg Tabs (Trazodone hcl) .... Take one pill by mouth at night as needed for sleep (instructions in spanish please) 4)  Ranitidine Hcl 150 Mg Tabs (Ranitidine hcl) .... One tab by mouth two times a day Prescriptions: RANITIDINE HCL 150 MG TABS (RANITIDINE HCL) one tab by mouth two times a day  #60 x 3   Entered and Authorized by:   Bobby Rumpf  MD   Signed by:   Bobby Rumpf  MD on 08/14/2009   Method used:  Electronically to        [facility] [phone]* (retail)       [address]       [place], [place]  [address]       Ph: [phone]       Fax: [phone]   RxID:   [id number]

## 2010-07-10 NOTE — Miscellaneous (Signed)
Summary: walk in   Clinical Lists Changes walked in c/o cough & chest congestion x 3 days. has taken tylenol. she does not smoke.  T99.3 orally, bp 113/78. p=92 wy is 210.3 ht is 61 inches. put in Dr. Rueben Bash schedule for 2:45. interpretor arranged.Marland KitchenMarland KitchenGolden Circle RN  Oct 18, 2009 2:13 PM

## 2010-07-10 NOTE — Assessment & Plan Note (Signed)
Summary: PE/MJ   Vital Signs:  Patient profile:   35 year old female Height:      61.5 inches Weight:      212 pounds BMI:     39.55 BSA:     1.95 Temp:     98.9 degrees F Pulse rate:   80 / minute BP sitting:   115 / 70  Vitals Entered By: Jone Baseman CMA (February 26, 2010 10:01 AM) CC: CPE Is Patient Diabetic? No Pain Assessment Patient in pain? no       Vision Screening:Left eye w/o correction: 20 / 25 Right Eye w/o correction: 20 / 20 Both eyes w/o correction:  20/ 20        Vision Entered By: Jone Baseman CMA (February 26, 2010 10:32 AM)   Primary Care Provider:  Bobby Rumpf  MD  CC:  CPE.  History of Present Illness: 1) Obesity: Weight stable since last visit. Has seen nutritionist in interim and has started increasing fruit and vegetable intake and cutting back on fried foods. Exercise at gym 4-5 days per week with walking for 30 minutes, bike for 25 minutes, and light weights.   2) Vision: Reports some difficulty with reading small print. Last eye exam was 20/30 bilaterally. Reports occasional mild headache after reading for extended periods of time or watching TV for extended periods of time.   3) Prevention: Due for Pap.   ROS: Denies chest pain, dyspne, nausea, vomiting or diarrhea, constipation, melena, hematochezia, fatigue, depressive symptoms, weakness, numbness, heat or cold intolerance, polyuria, polydipsia,   Habits & Providers  Alcohol-Tobacco-Diet     Tobacco Status: never     Passive Smoke Exposure: no  Current Medications (verified): 1)  Albuterol 90 Mcg/act  Aers (Albuterol) .... 2 Puffs Q4h As Needed Wheezing 2)  Qvar 40 Mcg/act  Aers (Beclomethasone Dipropionate) .Marland Kitchen.. 1 Puff Two Times A Day (Use Every Day As A Controler - Use Even When Not Wheezing) 3)  Ranitidine Hcl 150 Mg Tabs (Ranitidine Hcl) .... One Tab By Mouth Two Times A Day 4)  Proventil Hfa 108 (90 Base) Mcg/act Aers (Albuterol Sulfate) .... 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 No etoh, drugs or tobacco. .bikes 3 times a week. Father died @ 91 MVA. Married, husband with infidelity in december 2009. still ives with him.   Risk Factors: Smoking Status: never (02/26/2010) Passive Smoke Exposure: no (02/26/2010)  Past Surgical History: c-section - 06/09/1995 gall bladder 6/11   Physical Exam  General:  obese, pleasant, conversant, NAD  Head:  no sinus tenderness  Eyes:  pupils equal, round and reactive to light, extraoccular movements intact, visual fields intact, normal fundi on limited exam, vision screen reviewed  Ears:  TMs clear bilaterally  Nose:  no external erythema, no nasal discharge, no mucosal pallor, no mucosal edema, no intranasal foreign body, and no nasal polyps.   Mouth:  Oral mucosa and oropharynx without lesions or exudates.  Pharynx without erythema, tonsils not enlarged. Neck:  No deformities, masses, or tenderness noted. Lungs:  Normal respiratory effort, chest expands  symmetrically. Decreased air movement and breath sounds throughout. Lungs are clear to auscultation, no crackles or wheezes. Heart:  Normal rate and regular rhythm. S1 and S2 normal without gallop, murmur, click, rub or other extra sounds. Abdomen:  Bowel sounds positive,abdomen soft and non-tender without masses, organomegaly or hernias noted. Genitalia:  Pelvic Exam:        External: normal female genitalia without lesions or masses        Vagina: normal without lesions or masses        Cervix:  normal without lesions or masses        Adnexa: normal bimanual exam without masses or fullness        Uterus: normal by palpation        Pap smear: performed Msk:  5/5 strength bilateral upper and lower extremities  Pulses:  2+ radials and pedals  Extremities:  no edema  Neurologic:  alert & oriented X3, cranial nerves II-XII intact, strength normal in all extremities, and DTRs symmetrical and normal.   Skin:  no rash or suspicious lesions  Psych:  Oriented X3, good eye contact, happy    Impression & Recommendations:  Problem # 1:  SCREENING FOR MALIGNANT NEOPLASM OF THE CERVIX (ICD-V76.2) Assessment Comment Only Pap today.  Orders: Pap Smear-[facility] ([id number])  Problem # 2:  OBESITY (ICD-278.00) Assessment: Unchanged Will check CMET, and lipid panel as below as these have not been checked before. Encouraged continued efforts for dietary and activity modification. Follow up in 3 months.  Orders: Comp Met-[facility] [phone]) Lipid-[facility] ([id number])  Problem # 3:  UNSPECIFIED VISUAL LOSS (ICD-369.9) Vision exam today as above. Given prior score 20/30 bilaterally, complaints of deteriorating vision with reading will refer to ophthalmology. Normal exam today.   [id number]  Complete Medication List: 1)  Albuterol 90 Mcg/act Aers (Albuterol) .... 2 puffs q4h as needed wheezing 2)  Qvar 40 Mcg/act Aers (Beclomethasone dipropionate) .Marland Kitchen.. 1 puff two times a day (use every day as a controler - use even when not wheezing) 3)  Ranitidine Hcl 150 Mg Tabs (Ranitidine hcl) .... One tab by mouth two times a day 4)  Proventil Hfa 108 (90 Base) Mcg/act Aers (Albuterol sulfate) .... Two puffs q4hours as needed shortness of breath/cough  Other Orders: [facility] - Est  18-39 yrs ([address])

## 2010-07-10 NOTE — Assessment & Plan Note (Signed)
Summary: 278, oligomen / JCS   Vital Signs:  Patient profile:   35 year old female Height:      61.5 inches Weight:      214.3 pounds BMI:     39.98  Vitals Entered By: Wyona Almas PHD (July 02, 2009 2:22 PM)  Primary Care Provider:  Bobby Rumpf  MD   History of Present Illness: Assessment:  Spent 60 min with pt.  Usual eating pattern includes B, L, and D.  Bkfst includes a fruit smoothie, including  ~16 oz orange juice and fruit.  Usual intake includes diet soda, tortillas, rice, beans, fried eggs (in corn oil).  24-hr recall suggests kcal intake of  ~1410: B (7 AM)- smoothie; L (1 PM)- sandwich w/ 1 tbsp mayo, tomato, onion, lettuce, 2 slc ham on whole-wheat bread, water; D 7 (PM)- 1 c chili w/ pork, 1/2 c beans, 1/2 c rice, 3 corn tortillas, diet soda.    Nutrition Diagnosis: Physical inactivity (NB-2.1) related to time constraints and childcare responsibilities as evidenced by ex limited to walks with 2-YO 3-4 X wk.  Inappropriate intake of types of carbohydrate (NI-53.3) related to juice as evidenced by smoothie for breakfast daily, which includes 16 oz orange juice and additional fruit.    Intervention: See Patient Instructions.    Monitoring/Eval: Dietary intake, body weight, and exercise at 3-wk F/U.     Allergies: No Known Drug Allergies   Complete Medication List: 1)  Albuterol 90 Mcg/act Aers (Albuterol) .... 2 puffs q4h as needed wheezing 2)  Qvar 40 Mcg/act Aers (Beclomethasone dipropionate) .Marland Kitchen.. 1 puff two times a day (use every day as a controler - use even when not wheezing) 3)  Trazodone Hcl 50 Mg Tabs (Trazodone hcl) .... Take one pill by mouth at night as needed for sleep (instructions in spanish please)  Other Orders: Inital Assessment Each - [facility] [phone])  Patient Instructions: 1)  Obtain twice as many veg's as protein or carbohydrate foods for both lunch and dinner.  2)  Fresh or frozen vegetables are best.   3)  Try a breakfast of eggs (and a  tortilla if you like) instead of fruit and juice.  Pay attention to your appetite, and see if this helps to control your appetite better.   4)  Make an effort to be more active throughout the day; take any exercise opportunities throughout the day.  Also walk outside whenever you can.  It would be great if you can get one or two walks per week by yourself, at least 30 minutes.   5)  Follow-up:  Feb 14 at 1:30.

## 2010-07-10 NOTE — Miscellaneous (Signed)
Summary: Asthma QI   Clinical Lists Changes  Problems: Changed problem from ASTHMA (ICD-493.90) to ASTHMA, PERSISTENT (ICD-493.90) Medications: Removed medication of FLEXERIL 5 MG TABS (CYCLOBENZAPRINE HCL) one to two tabs by mouth three times a day as needed muscle spasm Removed medication of TRAZODONE HCL 50 MG TABS (TRAZODONE HCL) take one pill by mouth at night as needed for sleep (instructions in spanish please)

## 2010-07-10 NOTE — Medication Information (Signed)
Summary: RX Folder-[facility]  RX Folder-[facility]   Imported By: Vanessa [place] 04/28/2007 10:28:16  _____________________________________________________________________  External Attachment:    Type:   Image     Comment:   External Document

## 2010-07-10 NOTE — Letter (Signed)
Summary: Results Follow-up Letter  [facility]  [address]   [place], [place] [address]   Phone: [phone]  Fax: [phone]    02/28/2010  [address] [place] [address], [place]  [address]  Dear Ms. RODRIGUEZ-ZAMARRIPA,    Test     Result     Pap Smear    Normal  Cholesterol LDL(Bad cholesterol):   85      Your goal is less than:     130    HDL (Good cholesterol): 46     Your goal is more than: 40  !Bary Leriche! !Todo esta' bien!    Sincerely,  Bobby Rumpf  MD [facility]                    Appended Document: Results Follow-up Letter mailed.

## 2010-07-10 NOTE — Progress Notes (Signed)
Summary: re: appt/TS  ---- Converted from flag ---- ---- 06/11/2009 10:18 AM, Wyona Almas PHD wrote: Renato Battles,  Will you please get an interpretor for this patient?  Her appt with me is on Jan 25 at 2 PM.   Thanks,  Beverely Low ------------------------------  [place] will get interpretor.Marland KitchenMarland Kitchen

## 2010-07-10 NOTE — Assessment & Plan Note (Signed)
Summary: F/U/KH   Vital Signs:  Patient profile:   35 year old female Height:      61.5 inches Weight:      213.1 pounds  Vitals Entered By: Alexandra Almas PHD (July 22, 2009 1:45 PM)  Primary Care Provider:  Bobby Rumpf  MD   History of Present Illness: Assessment:  Saw pt for 45 minutes.  Alexandra Norris is walking 45-60 min 5 X wk, eating more veg's, drinking more water and no sodas or juice.  She is having for bkfst 2 eggs in spray oil, tomato, onion, jalapeno, corn tortilla, and water.  She is often snacking on carrots in the AM, has cuc, tom, let, onions, and pickles on a sandw at lunchtime, and microwaved veg's at dinner.  She said she feels very good, with more energy.  Alexandra Norris asked about wt loss aid Alli, which a friend is taking (see pt instructions).  After our discussion of the product, she said she would prefer to continue as she is doing - without the expensive drug.  While Alexandra Norris's wt is down just a little over a pound, she said it took a while to make each of the changes she has now achieved, so it's really been shorter than 3 weeks since the eating and exercise changes.  I assured Alexandra Norris that she will see a bigger weight loss next time if she continues doing all that she has started.    Nutrition Diagnosis: Much progress on physical inactivity (NB-2.1) related to time constraints and childcare responsibilities as evidenced by walking 45-60 min 5 X wk while her husband watches the children.  Excellent progress on inappropriate intake of types of carbohydrate (NI-53.3) related to juice as evidenced by eggs and tortilla instead of smoothie for breakfast daily.  Intervention: See Patient Instructions.    Monitoring/Eval: Dietary intake, body weight, and exercise a couple weeks after appt with Dr. Wallene Huh.    Allergies: No Known Drug Allergies   Complete Medication List: 1)  Albuterol 90 Mcg/act Aers (Albuterol) .... 2 puffs q4h as needed wheezing 2)  Qvar 40 Mcg/act  Aers (Beclomethasone dipropionate) .Marland Kitchen.. 1 puff two times a day (use every day as a controler - use even when not wheezing) 3)  Trazodone Hcl 50 Mg Tabs (Trazodone hcl) .... Take one pill by mouth at night as needed for sleep (instructions in spanish please)  Other Orders: Reassessment Each 15 min unit- [facility] ([address])  Patient Instructions: 1)  GREAT JOB ON YOUR DIET AND EXERCISE CHANGES!  Keep up the good work.   2)  Snacks can help you NOT get over-hungry, so go ahead and eat a snack if you are hungry.   3)  Fruit makes one of the best snacks.  Yogut is also a good snack.   4)  Pay attention to how full you feel at the end of a meal.  Eat slowly, and check in with yourself from time to time.  Try also to give your full attention to your meal when you eat.   5)  Alli is a medicine that prevents fat from food from being absorbed.  If you are already eating a pretty low-fat diet, then Alli will not do much for you, but it is very expensive.   6)  Schedule an appointment with Dr. Gerilyn Pilgrim about 2 weeks after you see Dr. Wallene Huh next.

## 2010-07-10 NOTE — Assessment & Plan Note (Signed)
Summary: flu shot,df   Nurse Visit   Vital Signs:  Patient profile:   35 year old female Temp:     97.6 degrees F  Vitals Entered By: Theresia Lo RN (May 07, 2010 12:13 PM)  Allergies: No Known Drug Allergies  Immunizations Administered:  Influenza Vaccine # 1:    Vaccine Type: Fluvax 3+    Site: left deltoid    Mfr: GlaxoSmithKline    Dose: 0.5 ml    Route: IM    Given by: Theresia Lo RN    Exp. Date: 12/06/2010    Lot #: [id number]    VIS given: 12/31/09 version given May 07, 2010.  Flu Vaccine Consent Questions:    Do you have a history of severe allergic reactions to this vaccine? no    Any prior history of allergic reactions to egg and/or gelatin? no    Do you have a sensitivity to the preservative Thimersol? no    Do you have a past history of Guillan-Barre Syndrome? no    Do you currently have an acute febrile illness? no    Have you ever had a severe reaction to latex? no    Vaccine information given and explained to patient? yes    Are you currently pregnant? no  Orders Added: 1)  Flu Vaccine 3yrs + [90658] 2)  Admin 1st Vaccine [90471]     Vital Signs:  Patient profile:   35 year old female Temp:     97.6 degrees F  Vitals Entered By: Theresia Lo RN (May 07, 2010 12:13 PM)

## 2010-07-10 NOTE — Assessment & Plan Note (Signed)
Summary: chest congestion/[place]/carew   Vital Signs:  Patient profile:   35 year old female Weight:      210.3 pounds Temp:     99.3 degrees F oral Pulse rate:   61 / minute BP sitting:   113 / 78  (right arm)  Vitals Entered By: Arlyss Repress CMA, (Oct 18, 2009 2:22 PM) CC: CHEST CONGESTION AND COUGH X 3 DAYS.   Primary Care Provider:  Bobby Rumpf  MD  CC:  CHEST CONGESTION AND COUGH X 3 DAYS.Marland Kitchen  History of Present Illness: Patient here for 3 day h/o productive cough, greenish phlegm, HA, SOB, pain behind her ears and fevers at night.  She has been taking Ibuprofen and Tylenol for her fevers.  She has a h/o Asthma but states her inhaler expired.  Her son had influenza a month ago, but is now doing well.  She denies any other sick contacts.  Interview was conducted with the assistance of a Spanish interpreter.   Patient also c/o upper back pain, under R scapula.  Palpation reveals muscle spasm in that area, tender to palp and some mild costochondritis.  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   Allergies: No Known Drug Allergies  Physical Exam  General:  Well-developed,well-nourished,in no acute distress; alert,appropriate and cooperative throughout examination Ears:  External ear exam shows no significant lesions or deformities.  Otoscopic examination reveals clear canals, tympanic membranes are intact bilaterally without bulging, retraction, inflammation or discharge. Hearing is grossly normal bilaterally. Mouth:  Oral mucosa and oropharynx without lesions or exudates.  Pharynx with mild erythema, tonsils not enlarged. Neck:  No deformities, masses, or tenderness noted. Lungs:  Normal respiratory effort, chest expands symmetrically. Decreased air movement and breath sounds throughout. Lungs are clear to auscultation, no crackles or wheezes. Heart:  Normal rate and regular rhythm. S1 and S2 normal without gallop, murmur, click, rub or other extra sounds. Abdomen:  Bowel  sounds positive,abdomen soft and non-tender without masses, organomegaly or hernias noted. Msk:  No deformity or scoliosis noted of thoracic or lumbar spine.  R thoracic paraspinal muscle tenderness, muscle spasm noted.    Impression & Recommendations:  Problem # 1:  ACUTE BRONCHITIS (ICD-466.0) Rx for Azithromycin. Albuterol Inhaler. Can use OTC Robitussin DM for cough.  Problem # 2:  ASTHMA (ICD-493.90) Refilled Albuterol.  Her updated medication list for this problem includes:    Albuterol 90 Mcg/act Aers (Albuterol) .Marland Kitchen... 2 puffs q4h as needed wheezing    Qvar 40 Mcg/act Aers (Beclomethasone dipropionate) .Marland Kitchen... 1 puff two times a day (use every day as a controler - use even when not wheezing)    Proventil Hfa 108 (90 Base) Mcg/act Aers (Albuterol sulfate) .Marland Kitchen..Marland Kitchen Two puffs q4hours as needed shortness of breath/cough  Problem # 3:  MUSCLE SPASM, BACK (ICD-724.8) Rx for Flexeril - short course.  Her updated medication list for this problem includes:    Flexeril 5 Mg Tabs (Cyclobenzaprine hcl) ..... One to two tabs by mouth three times a day as needed muscle spasm  Orders: [facility]- Est Level  3 ([address])  Complete Medication List: 1)  Albuterol 90 Mcg/act Aers (Albuterol) .... 2 puffs q4h as needed wheezing 2)  Qvar 40 Mcg/act Aers (Beclomethasone dipropionate) .Marland Kitchen.. 1 puff two times a day (use every day as a controler - use even when not wheezing) 3)  Trazodone Hcl 50 Mg Tabs (Trazodone hcl) .... Take one pill by mouth at night as needed for sleep (instructions in spanish please) 4)  Ranitidine Hcl 150 Mg Tabs (Ranitidine hcl) .... One tab by mouth two times a day 5)  Proventil Hfa 108 (90 Base) Mcg/act Aers (Albuterol sulfate) .... Two puffs q4hours as needed shortness of breath/cough 6)  Azithromycin 250 Mg Tabs (Azithromycin) .... Two tabs by mouth on day 1, followed by one tab by mouth daily on days 2-5 7)  Flexeril 5 Mg Tabs (Cyclobenzaprine hcl) .... One to two tabs by mouth three  times a day as needed muscle spasm Prescriptions: AZITHROMYCIN 250 MG TABS (AZITHROMYCIN) two tabs by mouth on day 1, followed by one tab by mouth daily on days 2-5  #6 x 0   Entered and Authorized by:   Jettie Pagan MD   Signed by:   Jettie Pagan MD on 10/18/2009   Method used:   Electronically to        [facility] [phone]* (retail)       [address]       [place], [place]  [address]       Ph: [phone]       Fax: [phone]   RxID:   [id number] PROVENTIL HFA 108 (90 BASE) MCG/ACT AERS (ALBUTEROL SULFATE) two puffs q4hours as needed shortness of breath/cough  #1 x 2   Entered and Authorized by:   Jettie Pagan MD   Signed by:   Jettie Pagan MD on 10/18/2009   Method used:   Electronically to        [facility] [phone]* (retail)       [address]       [place], [place]  [address]       Ph: [phone]       Fax: [phone]   RxID:   [id number] FLEXERIL 5 MG TABS (CYCLOBENZAPRINE HCL) one to two tabs by mouth three times a day as needed muscle spasm  #30 x 0   Entered and Authorized by:   Jettie Pagan MD   Signed by:   Jettie Pagan MD on 10/18/2009   Method used:   Electronically to        [facility] [phone]* (retail)       [address]       [place], [place]  [address]       Ph: [phone]       Fax: [phone]   RxID:   [id number]

## 2010-08-24 LAB — COMPREHENSIVE METABOLIC PANEL
ALT: 14 U/L (ref 0–35)
AST: 16 U/L (ref 0–37)
AST: 17 U/L (ref 0–37)
Albumin: 3.9 g/dL (ref 3.5–5.2)
Alkaline Phosphatase: 82 U/L (ref 39–117)
Alkaline Phosphatase: 83 U/L (ref 39–117)
Alkaline Phosphatase: 89 U/L (ref 39–117)
BUN: 10 mg/dL (ref 6–23)
BUN: 8 mg/dL (ref 6–23)
CO2: 26 mEq/L (ref 19–32)
Calcium: 8.6 mg/dL (ref 8.4–10.5)
Chloride: 101 mEq/L (ref 96–112)
Chloride: 106 mEq/L (ref 96–112)
Chloride: 108 mEq/L (ref 96–112)
Creatinine, Ser: 0.44 mg/dL (ref 0.4–1.2)
GFR calc Af Amer: >60 mL/min (ref >60)
GFR calc Af Amer: >60 mL/min (ref >60)
GFR calc non Af Amer: >60 mL/min (ref >60)
GFR calc non Af Amer: >60 mL/min (ref >60)
Glucose, Bld: 102 mg/dL — ABNORMAL HIGH (ref 70–99)
Potassium: 3.9 mEq/L (ref 3.5–5.1)
Potassium: 3.9 mEq/L (ref 3.5–5.1)
Potassium: 3.9 mEq/L (ref 3.5–5.1)
Sodium: 133 mEq/L — ABNORMAL LOW (ref 135–145)
Sodium: 139 mEq/L (ref 135–145)
Total Bilirubin: 0.8 mg/dL (ref 0.3–1.2)
Total Bilirubin: 0.8 mg/dL (ref 0.3–1.2)
Total Protein: 7.2 g/dL (ref 6.0–8.3)

## 2010-08-24 LAB — CBC
HCT: 37.8 % (ref 36.0–46.0)
HCT: 39.2 % (ref 36.0–46.0)
Hemoglobin: 13.3 g/dL (ref 12.0–15.0)
MCH: 28.7 pg (ref 26.0–34.0)
MCHC: 34 g/dL (ref 30.0–36.0)
MCV: 86.1 fL (ref 78.0–100.0)
Platelets: 255 10*3/uL (ref 150–400)
RBC: 4.39 MIL/uL (ref 3.87–5.11)
RBC: 4.57 MIL/uL (ref 3.87–5.11)
RBC: 4.59 MIL/uL (ref 3.87–5.11)
RDW: 13.8 % (ref 11.5–15.5)
RDW: 14 % (ref 11.5–15.5)
WBC: 10.6 10*3/uL — ABNORMAL HIGH (ref 4.0–10.5)
WBC: 13.5 10*3/uL — ABNORMAL HIGH (ref 4.0–10.5)
WBC: 16.2 10*3/uL — ABNORMAL HIGH (ref 4.0–10.5)

## 2010-08-24 LAB — DIFFERENTIAL
Basophils Absolute: 0 10*3/uL (ref 0.0–0.1)
Basophils Absolute: 0 10*3/uL (ref 0.0–0.1)
Basophils Relative: 0 % (ref 0–1)
Basophils Relative: 0 % (ref 0–1)
Basophils Relative: 0 % (ref 0–1)
Eosinophils Absolute: 0.2 10*3/uL (ref 0.0–0.7)
Eosinophils Relative: 2 % (ref 0–5)
Eosinophils Relative: 3 % (ref 0–5)
Lymphocytes Relative: 10 % — ABNORMAL LOW (ref 12–46)
Lymphs Abs: 2.4 10*3/uL (ref 0.7–4.0)
Monocytes Absolute: 0.7 10*3/uL (ref 0.1–1.0)
Monocytes Relative: 7 % (ref 3–12)
Neutro Abs: 14 10*3/uL — ABNORMAL HIGH (ref 1.7–7.7)
Neutro Abs: 6.2 10*3/uL (ref 1.7–7.7)
Neutrophils Relative %: 86 % — ABNORMAL HIGH (ref 43–77)

## 2010-08-24 LAB — URINALYSIS, ROUTINE W REFLEX MICROSCOPIC
Glucose, UA: NEGATIVE mg/dL
Hgb urine dipstick: NEGATIVE
Nitrite: NEGATIVE
Protein, ur: NEGATIVE mg/dL
pH: 7 (ref 5.0–8.0)

## 2010-10-21 NOTE — Discharge Summary (Signed)
NAMEHilaria Norris      ACCOUNT NO.:  [id number]   MEDICAL RECORD NO.:  [id number]          PATIENT TYPE:  INP   LOCATION:  9112                          FACILITY:  [facility]   PHYSICIAN:  Phil D. Okey Dupre, M.D.     DATE OF BIRTH:  August 25, 1975   DATE OF ADMISSION:  04/26/2007  DATE OF DISCHARGE:  04/29/2007                               DISCHARGE SUMMARY   DIAGNOSES ON DISCHARGE:  1. Repeat low transverse cesarean section.  2. Bilateral tubal ligation.  3. Delivery of viable female infant at term.   BRIEF HOSPITAL COURSE:  [name] was admitted to the [facility] on November 18th for a repeat low transverse  cesarean section.  Procedure was performed by Dr. Silas Flood and Dr. Okey Dupre.  Low transverse cesarean section was performed with result of a viable  female infant with Apgars of 9 and 10 at 1 and 5 minutes respectively and  a weight of 7 pounds 8 ounces.  A normal placenta was delivered intact  with 3-vessel cord and normal uterus.  Tubes and ovaries were found.  Bilateral tubal ligation using clips was performed.  Procedure was  indicated due to patient's history of uterine rupture with a previous  __________ .  Following the procedure, patient was transferred to the  postpartum floor where she had an uneventful recovery.  Her hemoglobin  postpartum was 11.2 down from 12.8.  She was asymptomatic other than  some slight pain at the incision site that was well tolerated.  She is  breast feeding well and was felt to be in good condition for discharge  home with followup in 6 weeks at the [facility] Department.  She is being discharged with Percocet and ibuprofen for analgesia, as  well as Colace and prenatal vitamins.  Circumcision was performed on the  infant prior to discharge.  Of note, patient's blood type is O+ and she  is HIV negative.     ______________________________  Franki Cabot D. Okey Dupre, M.D.  Electronically Signed    JP/MEDQ   D:  04/29/2007  T:  04/29/2007  Job:  [id number]

## 2010-10-21 NOTE — Op Note (Signed)
NAMEHilaria Norris      ACCOUNT NO.:  [id number]   MEDICAL RECORD NO.:  [id number]          PATIENT TYPE:  INP   LOCATION:                                FACILITY:  [facility]   PHYSICIAN:  Norton Blizzard, MD    DATE OF BIRTH:  Jul 26, 1975   DATE OF PROCEDURE:  04/26/2007  DATE OF DISCHARGE:                               OPERATIVE REPORT   PREOPERATIVE DIAGNOSES:  1. Intrauterine pregnancy at 37-1/2 weeks.  2. History of uterine rupture during VBAC.  3. History of prior cesarean section.  4. Multiparity, desires sterilization.   POSTOPERATIVE DIAGNOSES:  1. Intrauterine pregnancy at 37-1/2 weeks.  2. History of uterine rupture during VBAC.  3. History of prior cesarean section.  4. Multiparity, desires sterilization.   PROCEDURES:  1. Repeat low transverse cervical cesarean section via Pfannenstiel      incision.  2. Bilateral tubal ligation using Filshie clips.   SURGEON:  Norton Blizzard, MD.   ASSISTANTMichele Mcalpine D. Okey Dupre, M.D.   ANESTHESIA:  Spinal.   INTRAVENOUS FLUIDS:  2600 mL lactated Ringers.   ESTIMATED BLOOD LOSS:  800 mL.   URINE OUTPUT:  300 mL.   INDICATIONS FOR PROCEDURE:  Patient is a 35 year old G3, P3 with history  of prior cesarean section.  During her prior delivery, she had a vaginal  birth after cesarean section complicated by uterine rupture and  subsequent postpartum hemorrhage.  For this pregnancy, patient was  advised to have a repeat cesarean section at term, and was counseled  regarding the risks of cesarean section including bleeding, infection,  injury to surrounding organs and need for additional procedures.  She  was told that given her history of uterine rupture, that vaginal birth  was not advisable.  Patient also is multiparous and desired permanent  sterilization.  Risks of sterilization were discussed including a risk  of failure of 1:200; and the irreversible nature of this mode of  contraception.  Patient understood risks and  written informed consent  was obtained.   FINDINGS:  Viable female infant in cephalic presentation.  Apgars 9 and  10.  Weight 9 pounds 8 ounces.  Spontaneous placental delivery, intact  with three vessel cord.  Normal uterus, ovaries and tubes bilaterally.   SPECIMENS:  Placenta and cord blood.   COMPLICATIONS:  None.   DESCRIPTION OF PROCEDURE:  The patient was taken to the operating room  where spinal anesthesia was placed without difficulty.  She was then  placed in dorsal supine position with a leftward tilt and prepped and  draped in a sterile manner.  Attention was then turned to the patient's  abdomen where a Pfannenstiel incision was made with a scalpel, and  carried to the underlying layer of fascia.  The fascia was incised in  the midline, and the incision was extended bilaterally using Mayo  scissors.  Kocher clamps were used to elevate the fascia on the superior  aspect of the incision and the rectus muscles were dissected sharply and  bluntly.  Similar process was carried out on the inferior aspect of the  incision.  Rectus muscles were separated on  the midline and peritoneum  was entered bluntly.  This peritoneal incision was extended with good  visualization of the bowel and bladder.  An Alexis retractor was placed  at that time.  Attention was turned to the low uterine segment where a  bladder flap was created.  A transverse hysterotomy was made using a  scalpel and extended bilaterally bluntly  The infant was then delivered  atraumatically, the cord was clamped and cut and the infant was handed  over to the waiting pediatricians.  Uterine massage was then  administered and the placenta delivered intact with three vessel cord.  The uterus was then cleared of all clots and debris.  The hysterotomy  was repaired using 0 Monocryl in a running locked fashion.  Attention  was then turned to the fallopian tubes where Filshie clips were placed  on the isthmic regions of  bilateral tubes.  Overall good hemostasis was  noted.  The pelvis was then copiously irrigated and hemostasis was  confirmed on all surfaces.  The retractor was then removed and the  rectus muscles were approximated with two interrupted sutures of 2-0  chromic.  The fascia was then reapproximated with 0 Vicryl in a running  fashion.  The subcutaneous layer was reapproximated with 2-0 plain gut  in interrupted stitches, and skin was closed with staples.  The patient  tolerated the procedure well.  Sponge, lap, needle and instrument counts  were correct x2.  She was taken to the recovery room in stable  condition.      Norton Blizzard, MD  Electronically Signed     UAD/MEDQ  D:  04/26/2007  T:  04/26/2007  Job:  [id number]

## 2011-03-13 LAB — POCT RAPID STREP A: Streptococcus, Group A Screen (Direct): NEGATIVE

## 2011-03-16 LAB — CBC
HCT: 39
Hemoglobin: 13.2
MCHC: 33.9
RDW: 13.9

## 2011-03-16 LAB — DIFFERENTIAL
Basophils Absolute: 0
Basophils Relative: 0
Eosinophils Relative: 4
Monocytes Absolute: 0.5
Neutro Abs: 3.9

## 2011-03-17 LAB — POCT URINALYSIS DIP (DEVICE)
Glucose, UA: NEGATIVE
Ketones, ur: NEGATIVE
Operator id: 159681
Specific Gravity, Urine: 1.025
Urobilinogen, UA: 0.2

## 2011-03-17 LAB — CBC
Platelets: 181
RBC: 3.84 — ABNORMAL LOW
RDW: 13.5
WBC: 10.8 — ABNORMAL HIGH

## 2011-03-18 LAB — POCT URINALYSIS DIP (DEVICE)
Glucose, UA: NEGATIVE
Hgb urine dipstick: NEGATIVE
Ketones, ur: NEGATIVE
Ketones, ur: NEGATIVE
Operator id: 134861
Protein, ur: NEGATIVE
Specific Gravity, Urine: 1.02
Specific Gravity, Urine: 1.02
Urobilinogen, UA: 1
pH: 7.5

## 2011-03-18 LAB — WET PREP, GENITAL: Yeast Wet Prep HPF POC: NONE SEEN

## 2011-03-18 LAB — URINALYSIS, ROUTINE W REFLEX MICROSCOPIC
Glucose, UA: 100 — AB
Nitrite: NEGATIVE
Protein, ur: NEGATIVE

## 2011-03-19 LAB — POCT URINALYSIS DIP (DEVICE)
Hgb urine dipstick: NEGATIVE
Ketones, ur: NEGATIVE
Protein, ur: NEGATIVE
Specific Gravity, Urine: 1.02
pH: 7.5

## 2011-03-23 LAB — POCT URINALYSIS DIP (DEVICE)
Bilirubin Urine: NEGATIVE
Bilirubin Urine: NEGATIVE
Hgb urine dipstick: NEGATIVE
Ketones, ur: NEGATIVE
Ketones, ur: NEGATIVE
Protein, ur: NEGATIVE
Protein, ur: NEGATIVE
Specific Gravity, Urine: 1.015
Specific Gravity, Urine: 1.01
pH: 8.5 — ABNORMAL HIGH
pH: 7

## 2011-03-23 LAB — URINALYSIS, ROUTINE W REFLEX MICROSCOPIC
Bilirubin Urine: NEGATIVE
Glucose, UA: NEGATIVE
Hgb urine dipstick: NEGATIVE
Ketones, ur: NEGATIVE
Nitrite: NEGATIVE
Protein, ur: NEGATIVE
Specific Gravity, Urine: 1.015
Urobilinogen, UA: 0.2
pH: 7

## 2011-03-23 LAB — URINE MICROSCOPIC-ADD ON

## 2011-03-24 LAB — CBC
HCT: 34.8 — ABNORMAL LOW
Hemoglobin: 11.8 — ABNORMAL LOW
MCHC: 34
MCV: 87.5
Platelets: 206
RBC: 3.97
RDW: 13.4
WBC: 12.8 — ABNORMAL HIGH

## 2011-03-24 LAB — URINALYSIS, ROUTINE W REFLEX MICROSCOPIC
Glucose, UA: NEGATIVE
Hgb urine dipstick: NEGATIVE
Ketones, ur: 15 — AB
Nitrite: NEGATIVE
Protein, ur: NEGATIVE
Specific Gravity, Urine: >1.03 — ABNORMAL HIGH
Urobilinogen, UA: 1
pH: 6.5

## 2011-03-24 LAB — WET PREP, GENITAL
Clue Cells Wet Prep HPF POC: NONE SEEN
Trich, Wet Prep: NONE SEEN
Yeast Wet Prep HPF POC: NONE SEEN

## 2011-03-24 LAB — GC/CHLAMYDIA PROBE AMP, GENITAL
Chlamydia, DNA Probe: NEGATIVE
GC Probe Amp, Genital: NEGATIVE

## 2011-03-25 LAB — POCT URINALYSIS DIP (DEVICE)
Glucose, UA: NEGATIVE
Hgb urine dipstick: NEGATIVE
Nitrite: NEGATIVE
Protein, ur: NEGATIVE
Specific Gravity, Urine: 1.015
Urobilinogen, UA: 0.2

## 2011-03-26 LAB — POCT URINALYSIS DIP (DEVICE)
Hgb urine dipstick: NEGATIVE
Nitrite: NEGATIVE
Specific Gravity, Urine: 1.02
Urobilinogen, UA: 1
pH: 7.5

## 2011-12-17 ENCOUNTER — Inpatient Hospital Stay (HOSPITAL_COMMUNITY)
Admission: EM | Admit: 2011-12-17 | Discharge: 2011-12-20 | DRG: 392 | Disposition: A | Discharge status: Home / Self Care | Payer: Medicaid Other | Attending: Surgery | Admitting: Surgery

## 2011-12-17 ENCOUNTER — Encounter (HOSPITAL_COMMUNITY): Payer: Self-pay | Admitting: *Deleted

## 2011-12-17 ENCOUNTER — Emergency Department (HOSPITAL_COMMUNITY): Payer: Medicaid Other

## 2011-12-17 DIAGNOSIS — R1011 Right upper quadrant pain: Secondary | ICD-10-CM

## 2011-12-17 DIAGNOSIS — Z9089 Acquired absence of other organs: Secondary | ICD-10-CM

## 2011-12-17 DIAGNOSIS — D72829 Elevated white blood cell count, unspecified: Secondary | ICD-10-CM | POA: Diagnosis present

## 2011-12-17 DIAGNOSIS — R509 Fever, unspecified: Secondary | ICD-10-CM | POA: Diagnosis present

## 2011-12-17 DIAGNOSIS — R1031 Right lower quadrant pain: Principal | ICD-10-CM | POA: Diagnosis present

## 2011-12-17 HISTORY — DX: Unspecified asthma, uncomplicated: J45.909

## 2011-12-17 HISTORY — DX: Personal history of other medical treatment: Z92.89

## 2011-12-17 HISTORY — DX: Personal history of other diseases of the respiratory system: Z87.09

## 2011-12-17 LAB — CBC WITH DIFFERENTIAL/PLATELET
Basophils Relative: 0 % (ref 0–1)
Eosinophils Relative: 2 % (ref 0–5)
HCT: 40.3 % (ref 36.0–46.0)
Hemoglobin: 13.6 g/dL (ref 12.0–15.0)
MCHC: 33.7 g/dL (ref 30.0–36.0)
MCV: 84 fL (ref 78.0–100.0)
Monocytes Absolute: 0.9 10*3/uL (ref 0.1–1.0)
Monocytes Relative: 8 % (ref 3–12)
Neutro Abs: 7.6 10*3/uL (ref 1.7–7.7)

## 2011-12-17 LAB — URINALYSIS, ROUTINE W REFLEX MICROSCOPIC
Bilirubin Urine: NEGATIVE
Glucose, UA: NEGATIVE mg/dL
Hgb urine dipstick: NEGATIVE
Ketones, ur: NEGATIVE mg/dL
Protein, ur: NEGATIVE mg/dL
Urobilinogen, UA: 1 mg/dL (ref 0.0–1.0)

## 2011-12-17 LAB — COMPREHENSIVE METABOLIC PANEL
Albumin: 4 g/dL (ref 3.5–5.2)
BUN: 11 mg/dL (ref 6–23)
CO2: 24 mEq/L (ref 19–32)
Calcium: 9.4 mg/dL (ref 8.4–10.5)
Chloride: 104 mEq/L (ref 96–112)
Creatinine, Ser: 0.56 mg/dL (ref 0.50–1.10)
GFR calc non Af Amer: >90 mL/min (ref >90)
Total Bilirubin: 0.5 mg/dL (ref 0.3–1.2)

## 2011-12-17 LAB — PREGNANCY, URINE: Preg Test, Ur: NEGATIVE

## 2011-12-17 LAB — URINE MICROSCOPIC-ADD ON

## 2011-12-17 LAB — WET PREP, GENITAL: WBC, Wet Prep HPF POC: NONE SEEN

## 2011-12-17 IMAGING — CT CT ABD-PELV W/ CM
2 of 5 series · 17 of 46 positions shown, 19 images · IV contrast (water/omni  & 100ml omni 300)
Comparison: CT [DATE]

CLINICAL DATA: Fever, right mid to lower abdominal pain

CT ABDOMEN AND PELVIS WITH CONTRAST
TECHNIQUE: Multidetector CT imaging of the abdomen and pelvis was
performed following the standard protocol during bolus
administration of intravenous contrast.
Contrast: 100mL OMNIPAQUE IOHEXOL 300 MG/ML  SOLN

[Series 4: recon 3: routine abdomen · axial · 0.91mm/px · z∈[-498,-65]mm · 14 of 378 slices shown, 16 images]
[im 16/378  soft-tissue]
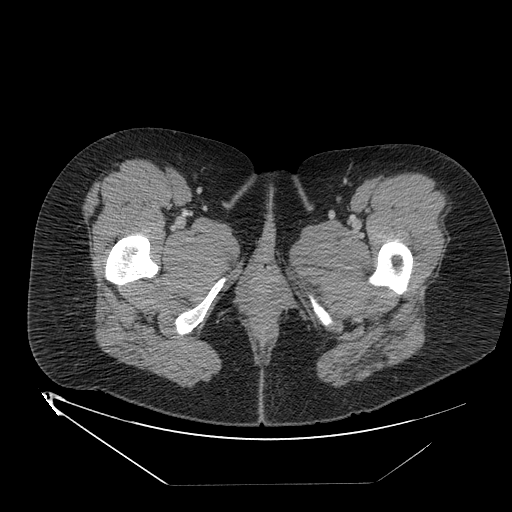
[im 16/378  bone]
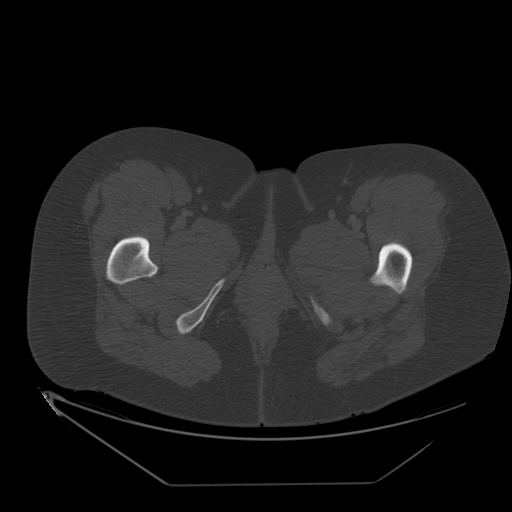
[im 48/378  soft-tissue]
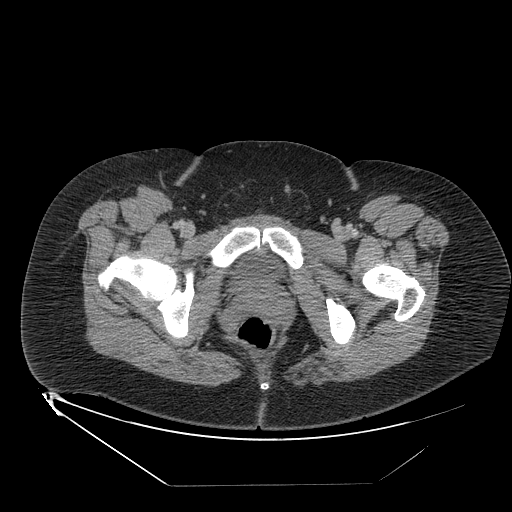
[im 79/378  soft-tissue]
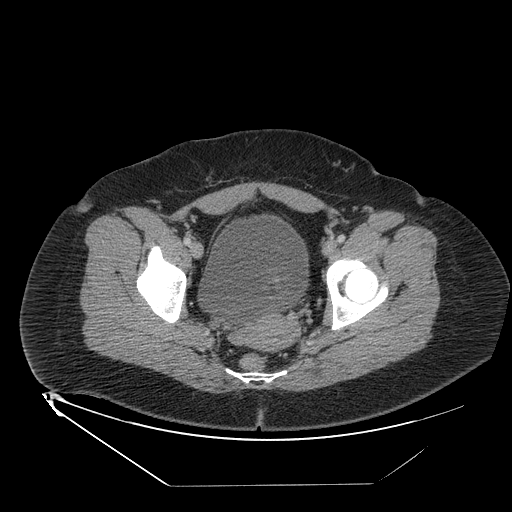
[im 95/378  soft-tissue]
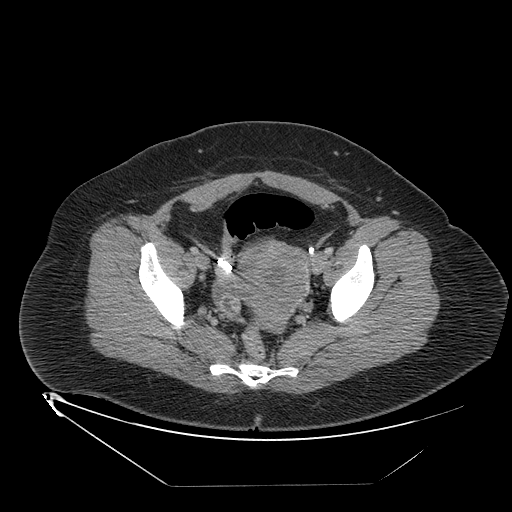
[im 126/378  soft-tissue]
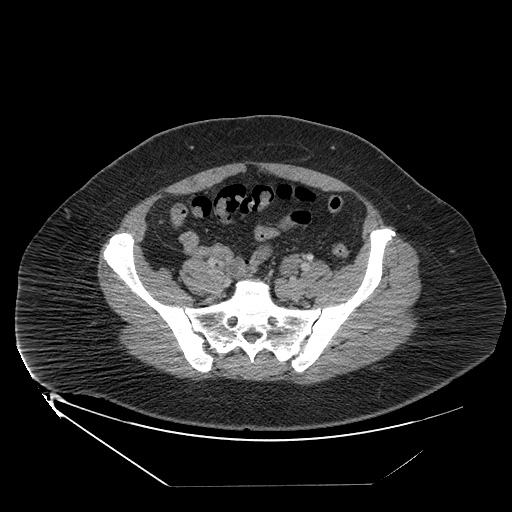
[im 158/378  soft-tissue]
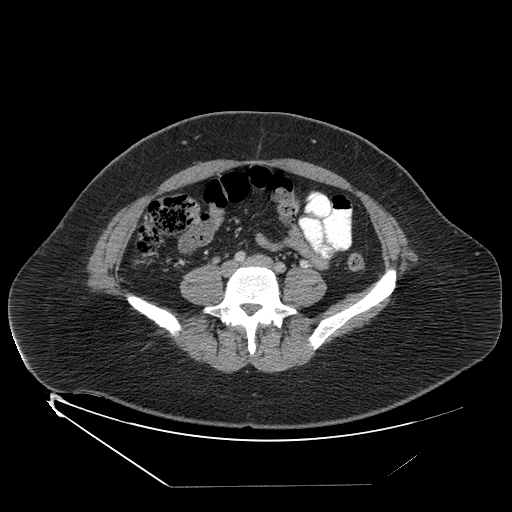
[im 173/378  soft-tissue]
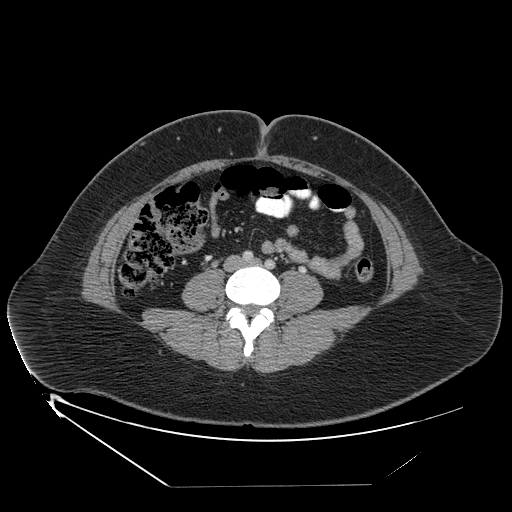
[im 205/378  soft-tissue]
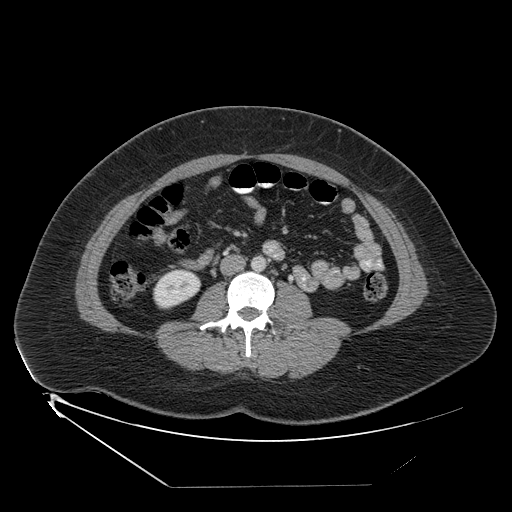
[im 220/378  soft-tissue]
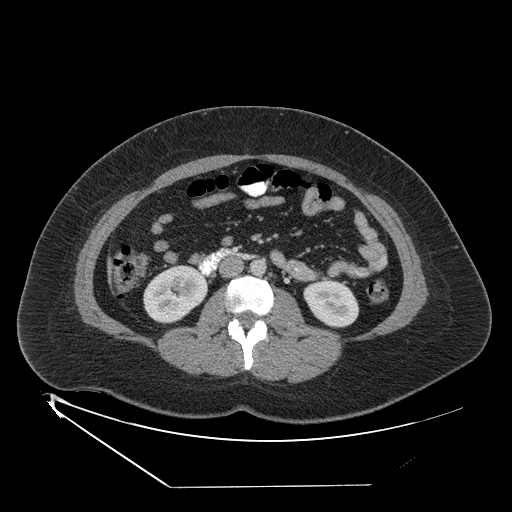
[im 220/378  bone]
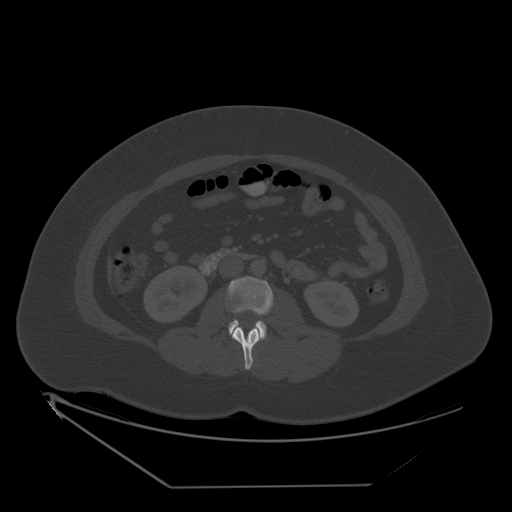
[im 252/378  soft-tissue]
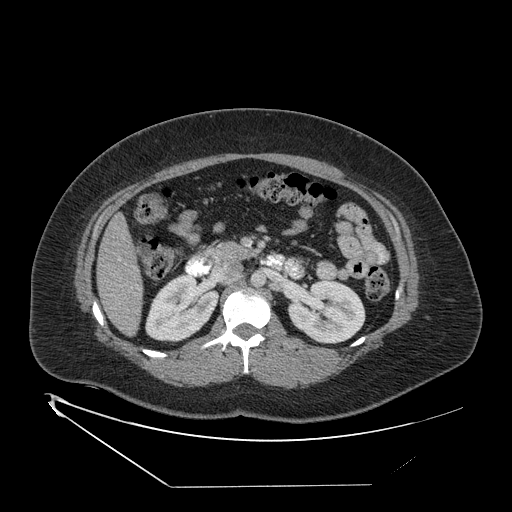
[im 283/378  soft-tissue]
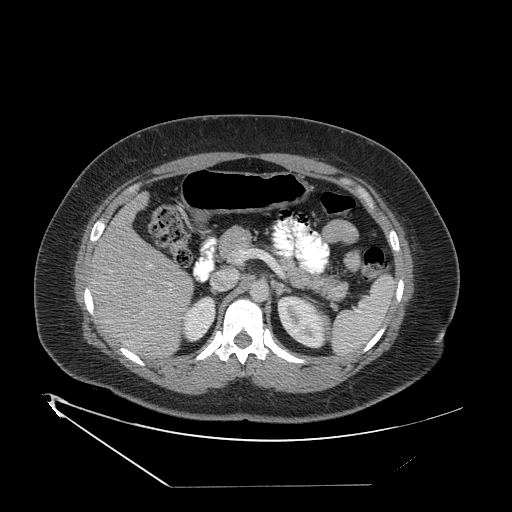
[im 299/378  soft-tissue]
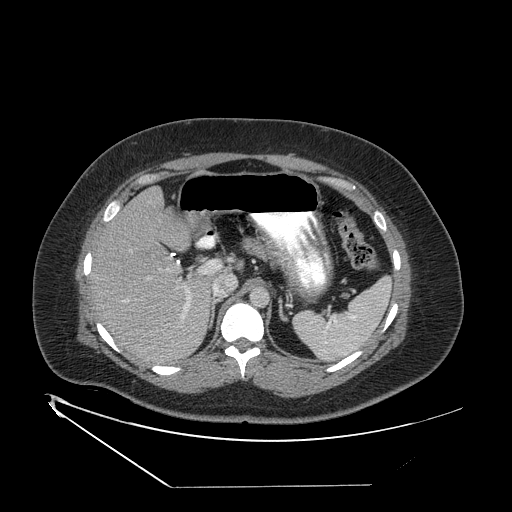
[im 330/378  soft-tissue]
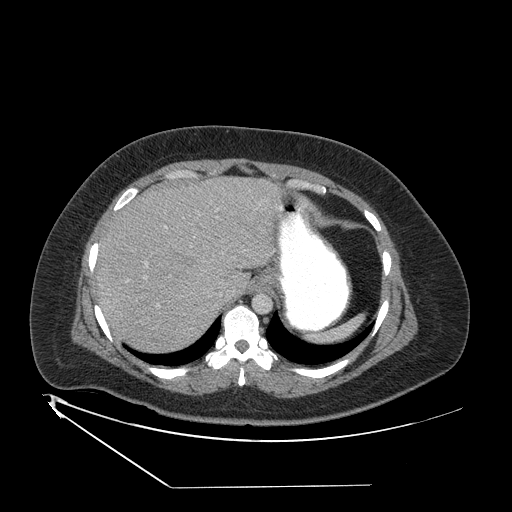
[im 362/378  soft-tissue]
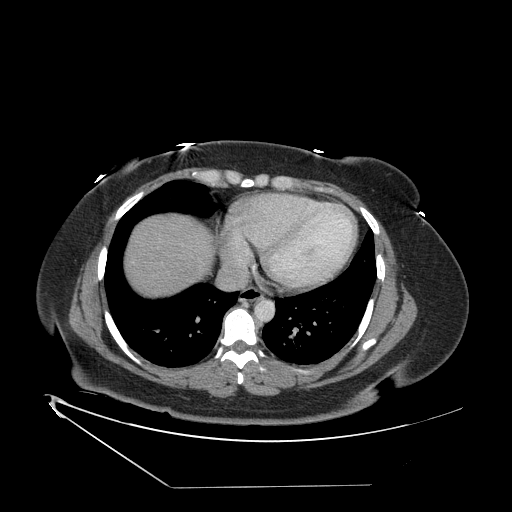

[Series 400: coronals · coronal · 0.95mm/px · 3 of 97 slices shown]
[im 33/97  soft-tissue]
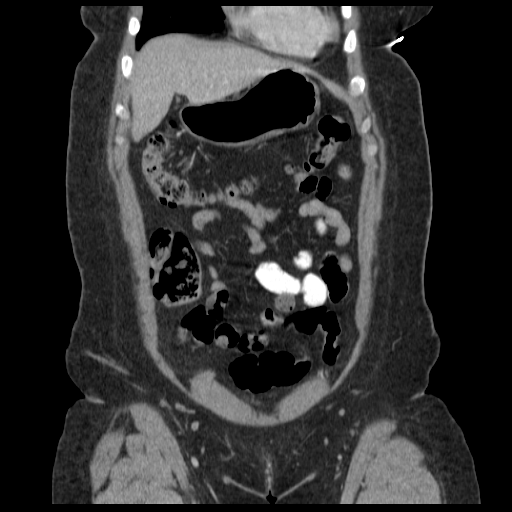
[im 43/97  soft-tissue]
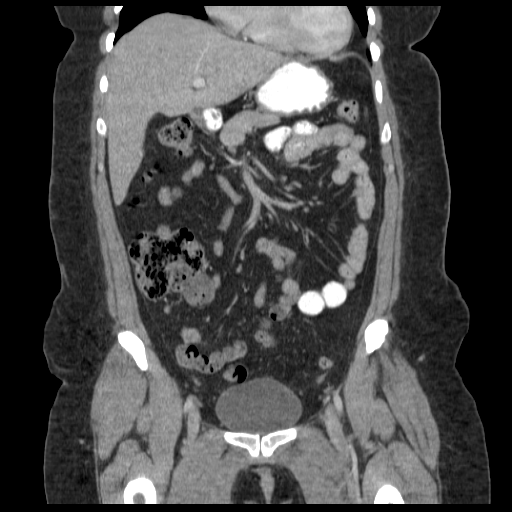
[im 54/97  soft-tissue]
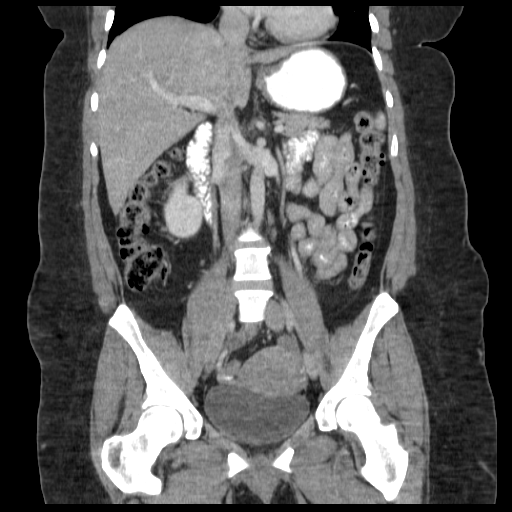

[17 of 46 positions shown; findings below may reference images not displayed]

FINDINGS: Lung bases are clear.  No pericardial fluid.

No focal hepatic lesion.  Prior cholecystectomy.  The pancreas,
spleen, adrenal glands, and kidneys are normal.

The stomach, small bowel, and cecum are normal.  Appendix is not
identified.  There is no pericecal inflammation to suggest acute
appendicitis.  The colon and rectosigmoid colon are normal.

Abdominal aorta normal caliber.  No retroperitoneal or periportal
lymphadenopathy.

No free fluid the pelvis.  Uterus and ovaries are normal.  Tubal
ligation clips are noted.  No pelvic lymphadenopathy. Review of
bone windows demonstrates no aggressive osseous lesions.
IMPRESSION: 1..  The appendix is not identified; however there are no secondary
signs of acute appendicitis.
2.  Cholecystectomy.

## 2011-12-17 MED ORDER — HYDROMORPHONE HCL PF 1 MG/ML IJ SOLN
0.5000 mg | INTRAMUSCULAR | Status: DC | PRN
  Administered 2011-12-17 – 2011-12-20 (×15): 1 mg via INTRAVENOUS
  Filled 2011-12-17 (×15): qty 1

## 2011-12-17 MED ORDER — DIPHENHYDRAMINE HCL 50 MG/ML IJ SOLN
12.5000 mg | Freq: Four times a day (QID) | INTRAMUSCULAR | Status: DC | PRN
  Administered 2011-12-19: 12.5 mg via INTRAVENOUS
  Filled 2011-12-17: qty 1

## 2011-12-17 MED ORDER — IOHEXOL 300 MG/ML  SOLN
20.0000 mL | INTRAMUSCULAR | Status: AC
  Administered 2011-12-17 (×2): 20 mL via ORAL

## 2011-12-17 MED ORDER — DEXTROSE 5 % IV SOLN
2.0000 g | Freq: Four times a day (QID) | INTRAVENOUS | Status: DC
  Administered 2011-12-17 – 2011-12-20 (×12): 2 g via INTRAVENOUS
  Filled 2011-12-17 (×16): qty 2

## 2011-12-17 MED ORDER — ONDANSETRON HCL 4 MG/2ML IJ SOLN
4.0000 mg | Freq: Once | INTRAMUSCULAR | Status: AC
  Administered 2011-12-17: 4 mg via INTRAVENOUS
  Filled 2011-12-17: qty 2

## 2011-12-17 MED ORDER — BIOTENE DRY MOUTH MT LIQD
15.0000 mL | Freq: Two times a day (BID) | OROMUCOSAL | Status: DC
  Administered 2011-12-17 – 2011-12-18 (×3): 15 mL via OROMUCOSAL

## 2011-12-17 MED ORDER — MORPHINE SULFATE 4 MG/ML IJ SOLN
4.0000 mg | Freq: Once | INTRAMUSCULAR | Status: AC
  Administered 2011-12-17: 4 mg via INTRAVENOUS
  Filled 2011-12-17: qty 1

## 2011-12-17 MED ORDER — PANTOPRAZOLE SODIUM 40 MG IV SOLR
40.0000 mg | Freq: Every day | INTRAVENOUS | Status: DC
  Administered 2011-12-17 – 2011-12-19 (×3): 40 mg via INTRAVENOUS
  Filled 2011-12-17 (×4): qty 40

## 2011-12-17 MED ORDER — ALBUTEROL SULFATE HFA 108 (90 BASE) MCG/ACT IN AERS
2.0000 | INHALATION_SPRAY | RESPIRATORY_TRACT | Status: DC | PRN
  Filled 2011-12-17: qty 6.7

## 2011-12-17 MED ORDER — HYDROMORPHONE HCL PF 1 MG/ML IJ SOLN
1.0000 mg | Freq: Once | INTRAMUSCULAR | Status: AC
  Administered 2011-12-17: 1 mg via INTRAVENOUS
  Filled 2011-12-17: qty 1

## 2011-12-17 MED ORDER — FLUTICASONE PROPIONATE HFA 44 MCG/ACT IN AERO
1.0000 | INHALATION_SPRAY | Freq: Two times a day (BID) | RESPIRATORY_TRACT | Status: DC
  Administered 2011-12-18 – 2011-12-20 (×5): 1 via RESPIRATORY_TRACT
  Filled 2011-12-17 (×2): qty 10.6

## 2011-12-17 MED ORDER — ONDANSETRON HCL 4 MG/2ML IJ SOLN
4.0000 mg | Freq: Four times a day (QID) | INTRAMUSCULAR | Status: DC | PRN
  Administered 2011-12-19 (×2): 4 mg via INTRAVENOUS
  Filled 2011-12-17 (×2): qty 2

## 2011-12-17 MED ORDER — OXYCODONE HCL 5 MG PO TABS: 5.0000 mg | ORAL_TABLET | ORAL | Status: DC | PRN

## 2011-12-17 MED ORDER — SODIUM CHLORIDE 0.9 % IV BOLUS (SEPSIS)
1000.0000 mL | Freq: Once | INTRAVENOUS | Status: AC
  Administered 2011-12-17: 1000 mL via INTRAVENOUS

## 2011-12-17 MED ORDER — IOHEXOL 300 MG/ML  SOLN
100.0000 mL | Freq: Once | INTRAMUSCULAR | Status: AC | PRN
  Administered 2011-12-17: 100 mL via INTRAVENOUS

## 2011-12-17 MED ORDER — DIPHENHYDRAMINE HCL 12.5 MG/5ML PO ELIX
12.5000 mg | ORAL_SOLUTION | Freq: Four times a day (QID) | ORAL | Status: DC | PRN
  Filled 2011-12-17: qty 10

## 2011-12-17 MED ORDER — SODIUM CHLORIDE 0.9 % IV SOLN
INTRAVENOUS | Status: DC
  Administered 2011-12-17 – 2011-12-19 (×5): via INTRAVENOUS

## 2011-12-17 NOTE — ED Notes (Signed)
abd pain since yesterday  With nv .  lmp June 20th

## 2011-12-17 NOTE — H&P (Signed)
Alexandra Norris is an 36 y.o. female.   Chief Complaint: Abdominal pain, nausea and vomiting HPI: 36 yr old female with 3 day history of abdominal in the right lower and mid abdomen with nausea and vomiting.  She has not been able to eat or drink much since Tuesday.  Her pain has been getting worse which prompted her to come to the ED.  She relates having chills and feeling feverish.  She has also had some diarrhea.  She has had a history of cholecystectomy, tubal ligation and 2 c-sections.  History reviewed. No pertinent past medical history.  History reviewed. No pertinent past surgical history.  No family history on file. Social History:  reports that she has never smoked. She does not have any smokeless tobacco history on file. She reports that she does not drink alcohol. Her drug history not on file.  Allergies: No Known Allergies   (Not in a hospital admission)  Results for orders placed during the hospital encounter of 12/17/11 (from the past 48 hour(s))  CBC WITH DIFFERENTIAL     Status: Abnormal   Collection Time   12/17/11  2:27 AM      Component Value Range Comment   WBC 11.8 (*) 4.0 - 10.5 K/uL    RBC 4.80  3.87 - 5.11 MIL/uL    Hemoglobin 13.6  12.0 - 15.0 g/dL    HCT 16.1  09.6 - 04.5 %    MCV 84.0  78.0 - 100.0 fL    MCH 28.3  26.0 - 34.0 pg    MCHC 33.7  30.0 - 36.0 g/dL    RDW 40.9  81.1 - 91.4 %    Platelets 264  150 - 400 K/uL    Neutrophils Relative 64  43 - 77 %    Neutro Abs 7.6  1.7 - 7.7 K/uL    Lymphocytes Relative 26  12 - 46 %    Lymphs Abs 3.0  0.7 - 4.0 K/uL    Monocytes Relative 8  3 - 12 %    Monocytes Absolute 0.9  0.1 - 1.0 K/uL    Eosinophils Relative 2  0 - 5 %    Eosinophils Absolute 0.3  0.0 - 0.7 K/uL    Basophils Relative 0  0 - 1 %    Basophils Absolute 0.0  0.0 - 0.1 K/uL   COMPREHENSIVE METABOLIC PANEL     Status: Abnormal   Collection Time   12/17/11  2:27 AM      Component Value Range Comment   Sodium 138  135 - 145  mEq/L    Potassium 3.9  3.5 - 5.1 mEq/L    Chloride 104  96 - 112 mEq/L    CO2 24  19 - 32 mEq/L    Glucose, Bld 128 (*) 70 - 99 mg/dL    BUN 11  6 - 23 mg/dL    Creatinine, Ser 7.82  0.50 - 1.10 mg/dL    Calcium 9.4  8.4 - 95.6 mg/dL    Total Protein 7.8  6.0 - 8.3 g/dL    Albumin 4.0  3.5 - 5.2 g/dL    AST 18  0 - 37 U/L    ALT 18  0 - 35 U/L    Alkaline Phosphatase 104  39 - 117 U/L    Total Bilirubin 0.5  0.3 - 1.2 mg/dL    GFR calc non Af Amer >90  >90 mL/min    GFR calc Af Amer >90  >  90 mL/min   URINALYSIS, ROUTINE W REFLEX MICROSCOPIC     Status: Abnormal   Collection Time   12/17/11  2:37 AM      Component Value Range Comment   Color, Urine YELLOW  YELLOW    APPearance CLOUDY (*) CLEAR    Specific Gravity, Urine 1.024  1.005 - 1.030    pH 7.5  5.0 - 8.0    Glucose, UA NEGATIVE  NEGATIVE mg/dL    Hgb urine dipstick NEGATIVE  NEGATIVE    Bilirubin Urine NEGATIVE  NEGATIVE    Ketones, ur NEGATIVE  NEGATIVE mg/dL    Protein, ur NEGATIVE  NEGATIVE mg/dL    Urobilinogen, UA 1.0  0.0 - 1.0 mg/dL    Nitrite NEGATIVE  NEGATIVE    Leukocytes, UA TRACE (*) NEGATIVE   PREGNANCY, URINE     Status: Normal   Collection Time   12/17/11  2:37 AM      Component Value Range Comment   Preg Test, Ur NEGATIVE  NEGATIVE   URINE MICROSCOPIC-ADD ON     Status: Normal   Collection Time   12/17/11  2:37 AM      Component Value Range Comment   Squamous Epithelial / LPF RARE  RARE    WBC, UA 0-2  <3 WBC/hpf    Bacteria, UA RARE  RARE   WET PREP, GENITAL     Status: Normal   Collection Time   12/17/11  5:58 AM      Component Value Range Comment   Yeast Wet Prep HPF POC NONE SEEN  NONE SEEN    Trich, Wet Prep NONE SEEN  NONE SEEN    Clue Cells Wet Prep HPF POC NONE SEEN  NONE SEEN    WBC, Wet Prep HPF POC NONE SEEN  NONE SEEN    Ct Abdomen Pelvis W Contrast  12/17/2011  *RADIOLOGY REPORT*  Clinical Data: Fever, right mid to lower abdominal pain  CT ABDOMEN AND PELVIS WITH CONTRAST   Technique:  Multidetector CT imaging of the abdomen and pelvis was performed following the standard protocol during bolus administration of intravenous contrast.  Contrast: OMNIPAQUE IOHEXOL 300 MG/ML  SOLN  Comparison: CT 05/17/2007  Findings: Lung bases are clear.  No pericardial fluid.  No focal hepatic lesion.  Prior cholecystectomy.  The pancreas, spleen, adrenal glands, and kidneys are normal.  The stomach, small bowel, and cecum are normal.  Appendix is not identified.  There is no pericecal inflammation to suggest acute appendicitis.  The colon and rectosigmoid colon are normal.  Abdominal aorta normal caliber.  No retroperitoneal or periportal lymphadenopathy.  No free fluid the pelvis.  Uterus and ovaries are normal.  Tubal ligation clips are noted.  No pelvic lymphadenopathy. Review of bone windows demonstrates no aggressive osseous lesions.  IMPRESSION:  1.  The appendix is not identified; however there are no secondary signs of acute appendicitis. 2.  Cholecystectomy.  Original Report Authenticated By: Genevive Bi, M.D.    Review of Systems  Constitutional: Positive for fever, chills and malaise/fatigue.  HENT: Negative.   Eyes: Negative.   Respiratory: Negative.   Cardiovascular: Negative.   Gastrointestinal: Positive for nausea, vomiting, abdominal pain and diarrhea.       Symptoms since Tuesday   Genitourinary: Negative.        Strong smelling urine in last 2 days  Musculoskeletal: Negative.   Skin: Negative.   Neurological: Negative.   Endo/Heme/Allergies: Negative.   Psychiatric/Behavioral: Negative.  Blood pressure 108/68, pulse 70, temperature 98.3 F (36.8 C), temperature source Oral, resp. rate 16, last menstrual period 11/26/2011, SpO2 99.00%. Physical Exam  Constitutional: She is oriented to person, place, and time. She appears well-developed and well-nourished. No distress.  HENT:  Head: Normocephalic and atraumatic.  Eyes: EOM are normal. Pupils are  equal, round, and reactive to light.  Neck: Normal range of motion. Neck supple.  Cardiovascular: Normal rate and regular rhythm.   Respiratory: Effort normal and breath sounds normal.  GI: Soft. Bowel sounds are normal. There is tenderness (right lower quadrant).  Genitourinary:       deferred  Musculoskeletal: Normal range of motion.  Neurological: She is alert and oriented to person, place, and time.  Skin: Skin is warm and dry.  Psychiatric: She has a normal mood and affect. Her behavior is normal.     Assessment/Plan 1.  Right lower quadrant pain of unknown etiology: the CT scan is not definitive for acute appendicitis and the appendix is not well identified.  She does have significant nausea and vomiting and RLQ abdominal pain with only a mild leukocytosis.  Given this we will admit the patient for observation and recheck labs and her symptoms in the AM.  It is not certain that the appendix is the cause of her symptoms but if her symptoms do not improve tomorrow or her lab work suggest infectious process then we will consider lap appy.  This was explained to the patient through an interpreter and the patient agrees with the plan and expresses understanding  WHITE, ELIZABETH 12/17/2011, 11:33 AM

## 2011-12-17 NOTE — ED Provider Notes (Signed)
Pt received in signout from [place], [place]. Pt with abd pain since yesterday. She has focal tenderness at RLQ. Unremarkable pelvic exam per previous provider. Mild leukocytosis; pt awaiting CT abd/pelvis to r/o appendicitis.  8:51 AM Pt reassessed. CT read as normal; appendix not well visualized, but no secondary signs of appendicitis. On repeat exam, pt is still tender in the RLQ with some guarding. Negative Rovsing but positive jar testing and obturator. Pt has been requiring Dilaudid regularly for pain control.  12:13 PM Given pt's persistent pain, surgery was consulted. They have seen the patient and plan to admit for serial abdominal exams and possible lap appendectomy.  Grant Fontana, PA-C 12/17/11 1213

## 2011-12-17 NOTE — ED Notes (Signed)
MD Davidson at bedside.

## 2011-12-17 NOTE — ED Notes (Signed)
REPORT CALLED

## 2011-12-17 NOTE — ED Provider Notes (Signed)
Medical screening examination/treatment/procedure(s) were performed by non-physician practitioner and as supervising physician I was immediately available for consultation/collaboration.   Indiana Pechacek B. Bernette Mayers, MD 12/17/11 [phone]

## 2011-12-17 NOTE — ED Notes (Signed)
Patient taught how to call RN. Taken green swabs for mouth and explained need to remain NPO.

## 2011-12-17 NOTE — ED Provider Notes (Signed)
36 yo lady with 2 day history of abdominal pain.  PH of cholecystectomy.  Exam shows significant RLQ tenderness.  WBC 11,800.  CT did not show the appendix.  Call to [facility] to see her for possible appendicitis.  Carleene Cooper III, MD 12/17/11 1710

## 2011-12-17 NOTE — ED Provider Notes (Signed)
History     CSN: [id number]  Arrival date & time 12/17/11  0216   First MD Initiated Contact with Patient 12/17/11 [phone]      Chief Complaint  Patient presents with  . Abdominal Pain   HPI  History provided by the patient. Patient is a 36 year old Hispanic female who presents with complaints of lower abdominal pain that began 2 days ago. Pain began gradually and has increased significantly. Pain was associated with some episodes of nausea and vomiting and decreased appetite all day yesterday. Patient also reports having one or 2 loose stools through the day. Patient used ibuprofen for pain without any relief. She denies any other aggravating or alleviating factors. She denies having any fever, chills or sweats. Patient had last normal menstrual period on June 20.    History reviewed. No pertinent past medical history.  History reviewed. No pertinent past surgical history.  No family history on file.  History  Substance Use Topics  . Smoking status: Never Smoker   . Smokeless tobacco: Not on file  . Alcohol Use: No    OB History    Grav Para Term Preterm Abortions TAB SAB Ect Mult Living                  Review of Systems  Constitutional: Positive for appetite change. Negative for fever and chills.  Gastrointestinal: Positive for nausea, vomiting and abdominal pain. Negative for diarrhea and constipation.  Genitourinary: Negative for dysuria, frequency, hematuria, flank pain, vaginal bleeding and vaginal discharge.    Allergies  Review of patient's allergies indicates not on file.  Home Medications   Current Outpatient Rx  Name Route Sig Dispense Refill  . ALBUTEROL SULFATE HFA 108 (90 BASE) MCG/ACT IN AERS Inhalation Inhale 2 puffs into the lungs every 4 (four) hours as needed.      . ALBUTEROL 90 MCG/ACT IN AERS Inhalation Inhale 2 puffs into the lungs every 4 (four) hours as needed.      . BECLOMETHASONE DIPROPIONATE 40 MCG/ACT IN AERS Inhalation Inhale 1 puff  into the lungs 2 (two) times daily. Use everyday as a controller-use even when not wheezing.     Marland Kitchen RANITIDINE HCL 150 MG PO TABS Oral Take 150 mg by mouth 2 (two) times daily.        BP 116/68  Pulse 85  Temp 98.2 F (36.8 C) (Oral)  Resp 16  SpO2 100%  LMP 11/26/2011  Physical Exam  Nursing note and vitals reviewed. Constitutional: She is oriented to person, place, and time. She appears well-developed and well-nourished. No distress.  HENT:  Head: Normocephalic and atraumatic.  Cardiovascular: Normal rate and regular rhythm.   Pulmonary/Chest: Effort normal and breath sounds normal.  Abdominal: Soft. She exhibits no distension. There is tenderness in the right lower quadrant. There is no rebound, no guarding, no CVA tenderness and negative Murphy's sign.  Genitourinary: Cervix exhibits no motion tenderness, no discharge and no friability. Right adnexum displays tenderness. Right adnexum displays no mass and no fullness. Left adnexum displays no mass, no tenderness and no fullness.       Problem was present. Patient was some right adnexal tenderness but pain is greater higher in the abdomen area.  Neurological: She is alert and oriented to person, place, and time.  Skin: Skin is warm and dry. No rash noted. No erythema.  Psychiatric: She has a normal mood and affect. Her behavior is normal.    ED Course  Procedures  Results for  orders placed during the hospital encounter of 12/17/11  URINALYSIS, ROUTINE W REFLEX MICROSCOPIC      Component Value Range   Color, Urine YELLOW  YELLOW   APPearance CLOUDY (*) CLEAR   Specific Gravity, Urine 1.024  1.005 - 1.030   pH 7.5  5.0 - 8.0   Glucose, UA NEGATIVE  NEGATIVE mg/dL   Hgb urine dipstick NEGATIVE  NEGATIVE   Bilirubin Urine NEGATIVE  NEGATIVE   Ketones, ur NEGATIVE  NEGATIVE mg/dL   Protein, ur NEGATIVE  NEGATIVE mg/dL   Urobilinogen, UA 1.0  0.0 - 1.0 mg/dL   Nitrite NEGATIVE  NEGATIVE   Leukocytes, UA TRACE (*) NEGATIVE    PREGNANCY, URINE      Component Value Range   Preg Test, Ur NEGATIVE  NEGATIVE  CBC WITH DIFFERENTIAL      Component Value Range   WBC 11.8 (*) 4.0 - 10.5 K/uL   RBC 4.80  3.87 - 5.11 MIL/uL   Hemoglobin 13.6  12.0 - 15.0 g/dL   HCT 16.1  09.6 - 04.5 %   MCV 84.0  78.0 - 100.0 fL   MCH 28.3  26.0 - 34.0 pg   MCHC 33.7  30.0 - 36.0 g/dL   RDW 40.9  81.1 - 91.4 %   Platelets 264  150 - 400 K/uL   Neutrophils Relative 64  43 - 77 %   Neutro Abs 7.6  1.7 - 7.7 K/uL   Lymphocytes Relative 26  12 - 46 %   Lymphs Abs 3.0  0.7 - 4.0 K/uL   Monocytes Relative 8  3 - 12 %   Monocytes Absolute 0.9  0.1 - 1.0 K/uL   Eosinophils Relative 2  0 - 5 %   Eosinophils Absolute 0.3  0.0 - 0.7 K/uL   Basophils Relative 0  0 - 1 %   Basophils Absolute 0.0  0.0 - 0.1 K/uL  COMPREHENSIVE METABOLIC PANEL      Component Value Range   Sodium 138  135 - 145 mEq/L   Potassium 3.9  3.5 - 5.1 mEq/L   Chloride 104  96 - 112 mEq/L   CO2 24  19 - 32 mEq/L   Glucose, Bld 128 (*) 70 - 99 mg/dL   BUN 11  6 - 23 mg/dL   Creatinine, Ser 7.82  0.50 - 1.10 mg/dL   Calcium 9.4  8.4 - 95.6 mg/dL   Total Protein 7.8  6.0 - 8.3 g/dL   Albumin 4.0  3.5 - 5.2 g/dL   AST 18  0 - 37 U/L   ALT 18  0 - 35 U/L   Alkaline Phosphatase 104  39 - 117 U/L   Total Bilirubin 0.5  0.3 - 1.2 mg/dL   GFR calc non Af Amer >90  >90 mL/min   GFR calc Af Amer >90  >90 mL/min  URINE MICROSCOPIC-ADD ON      Component Value Range   Squamous Epithelial / LPF RARE  RARE   WBC, UA 0-2  <3 WBC/hpf   Bacteria, UA RARE  RARE      No results found.   No diagnosis found.    MDM  5:00 AM patient seen and evaluated. Patient no acute distress but does appear uncomfortable.  Labs show slightly elevated WBC. Otherwise unremarkable. Patient has prior history of cholecystectomy. Pain feels equal in severity to cholecystitis pains 2 years ago. Pain is however located in right lower quadrant. Patient is tender there on exam.  Will obtain  contrast CT to rule out acute appendicitis.  Zofran, IV fluids and morphine ordered for symptoms.   Patient discussed with Grant Fontana PA-C in sign out. She will follow CT results.   Angus Seller, [place] 12/17/11 [phone]

## 2011-12-17 NOTE — H&P (Signed)
I have seen and examined the patient and agree with the assessment and plans.  Will follow closely.  May need diagnostic laparoscopy in not improving  Alexandra Norris A. Magnus Ivan  MD, FACS

## 2011-12-17 NOTE — ED Notes (Signed)
Patient transported to CT 

## 2011-12-18 LAB — CBC
HCT: 39.5 % (ref 36.0–46.0)
Hemoglobin: 13.2 g/dL (ref 12.0–15.0)
MCH: 28.2 pg (ref 26.0–34.0)
MCHC: 33.4 g/dL (ref 30.0–36.0)
MCV: 84.4 fL (ref 78.0–100.0)
Platelets: 250 10*3/uL (ref 150–400)
RBC: 4.68 MIL/uL (ref 3.87–5.11)
RDW: 13.6 % (ref 11.5–15.5)
WBC: 10.1 10*3/uL (ref 4.0–10.5)

## 2011-12-18 LAB — BASIC METABOLIC PANEL
BUN: 4 mg/dL — ABNORMAL LOW (ref 6–23)
CO2: 25 mEq/L (ref 19–32)
Calcium: 9 mg/dL (ref 8.4–10.5)
Chloride: 104 mEq/L (ref 96–112)
Creatinine, Ser: 0.53 mg/dL (ref 0.50–1.10)
GFR calc Af Amer: >90 mL/min (ref >90)
GFR calc non Af Amer: >90 mL/min (ref >90)
Glucose, Bld: 95 mg/dL (ref 70–99)
Potassium: 3.7 mEq/L (ref 3.5–5.1)
Sodium: 138 mEq/L (ref 135–145)

## 2011-12-18 NOTE — Progress Notes (Signed)
I have seen and examined the patient and agree with the assessment and plans. She reports her pain is improving. On exam, her right lower quadrant is definitely less tender and softer.  Her WBC is less.  Her CT, while not visualizing the appendix, showed no inflammation what-so-ever.  I will continue to manage this conservatively.  Makelle Marrone A. Magnus Ivan  MD, FACS

## 2011-12-18 NOTE — Progress Notes (Signed)
  Subjective:  Generally feels okay this morning RLQ pain persists in spite of pain medications. +N/V reported as well.  Objective: Vital signs in last 24 hours: Temp:  [97.6 F (36.4 C)-98.6 F (37 C)] 98.3 F (36.8 C) (07/12 1007) Pulse Rate:  [64-82] 65  (07/12 1007) Resp:  [16-20] 20  (07/12 1007) BP: (96-123)/(52-71) 98/62 mmHg (07/12 1007) SpO2:  [96 %-100 %] 98 % (07/12 1007) Weight:  [203 lb (92.08 kg)] 203 lb (92.08 kg) (07/12 1007) Last BM Date: 12/16/11  Intake/Output from previous day: 07/11 0701 - 07/12 0700 In: 1522.5 [P.O.:240; I.V.:1172.5; IV Piggyback:110] Out: 1 [Urine:1] Intake/Output this shift:    General appearance: alert, cooperative, appears stated age and no distress Abdomen: no guarding or rebound tenderness, but is tender to palpation in RLQ. No BS, No BM, + N/V WBC improving remains afebrile, NPO. Lab Results:   Basename 12/18/11 0650 12/17/11 0227  WBC 10.1 11.8*  HGB 13.2 13.6  HCT 39.5 40.3  PLT 250 264   BMET  Basename 12/18/11 0650 12/17/11 0227  NA 138 138  K 3.7 3.9  CL 104 104  CO2 25 24  GLUCOSE 95 128*  BUN 4* 11  CREATININE 0.53 0.56  CALCIUM 9.0 9.4   PT/INR No results found for this basename: LABPROT:2,INR:2 in the last 72 hours ABG No results found for this basename: PHART:2,PCO2:2,PO2:2,HCO3:2 in the last 72 hours  Studies/Results: Ct Abdomen Pelvis W Contrast  12/17/2011  *RADIOLOGY REPORT*  Clinical Data: Fever, right mid to lower abdominal pain  CT ABDOMEN AND PELVIS WITH CONTRAST  Technique:  Multidetector CT imaging of the abdomen and pelvis was performed following the standard protocol during bolus administration of intravenous contrast.  Contrast: OMNIPAQUE IOHEXOL 300 MG/ML  SOLN  Comparison: CT 05/17/2007  Findings: Lung bases are clear.  No pericardial fluid.  No focal hepatic lesion.  Prior cholecystectomy.  The pancreas, spleen, adrenal glands, and kidneys are normal.  The stomach, small bowel, and  cecum are normal.  Appendix is not identified.  There is no pericecal inflammation to suggest acute appendicitis.  The colon and rectosigmoid colon are normal.  Abdominal aorta normal caliber.  No retroperitoneal or periportal lymphadenopathy.  No free fluid the pelvis.  Uterus and ovaries are normal.  Tubal ligation clips are noted.  No pelvic lymphadenopathy. Review of bone windows demonstrates no aggressive osseous lesions.  IMPRESSION:  1.  The appendix is not identified; however there are no secondary signs of acute appendicitis. 2.  Cholecystectomy.  Original Report Authenticated By: Genevive Bi, M.D.    Anti-infectives: Anti-infectives     Start     Dose/Rate Route Frequency Ordered Stop   12/17/11 1300   cefOXitin (MEFOXIN) 2 g in dextrose 5 % 50 mL IVPB        2 g 100 mL/hr over 30 Minutes Intravenous 4 times per day 12/17/11 1250            Assessment/Plan: s/p * No surgery found * 1. Need for laparoscopic appendectomy vs CT scan with rectal contrast to r/o appendicitis. (will discuss with Dr. Magnus Ivan).previous CT equivocal. 2. Continue NPO status.  LOS: 1 day    Alexandra Norris 12/18/2011

## 2011-12-19 ENCOUNTER — Inpatient Hospital Stay (HOSPITAL_COMMUNITY): Payer: Medicaid Other

## 2011-12-19 LAB — CBC WITH DIFFERENTIAL/PLATELET
Basophils Relative: 0 % (ref 0–1)
Eosinophils Absolute: 0.2 10*3/uL (ref 0.0–0.7)
Eosinophils Relative: 2 % (ref 0–5)
MCH: 28 pg (ref 26.0–34.0)
MCHC: 33.2 g/dL (ref 30.0–36.0)
MCV: 84.3 fL (ref 78.0–100.0)
Neutrophils Relative %: 66 % (ref 43–77)
Platelets: 255 10*3/uL (ref 150–400)
RDW: 13.4 % (ref 11.5–15.5)

## 2011-12-19 IMAGING — CT CT ABD-PELV W/ CM
2 of 4 series · 17 of 46 positions shown, 19 images · IV contrast (APPLIED)
Comparison: [DATE]

CLINICAL DATA: Worsening right lower quadrant pain and right groin
pain.

CT ABDOMEN AND PELVIS WITH CONTRAST
TECHNIQUE: Multidetector CT imaging of the abdomen and pelvis was
performed following the standard protocol during bolus
administration of intravenous contrast.
Contrast:  100 ml [XC] and oral contrast

[Series 2: abd/pelv with 5.0 b31f st · axial · 0.84mm/px · z∈[-474,-24]mm · 14 of 98 slices shown, 16 images]
[im 4/98  soft-tissue]
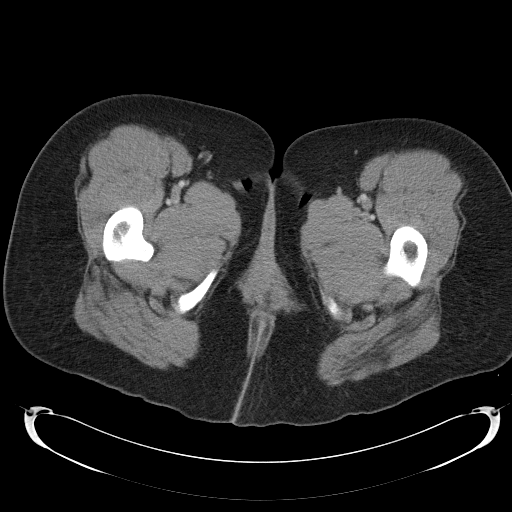
[im 4/98  bone]
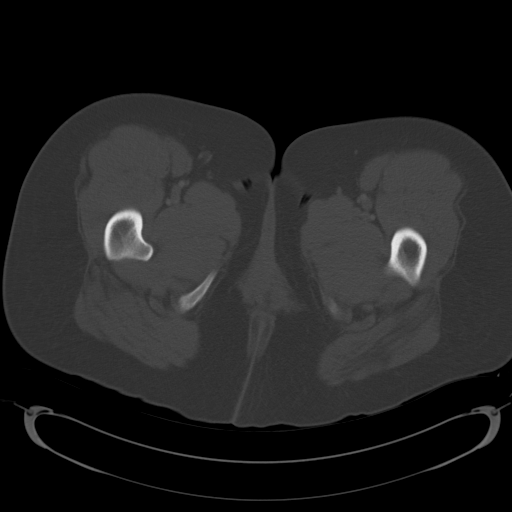
[im 12/98  soft-tissue]
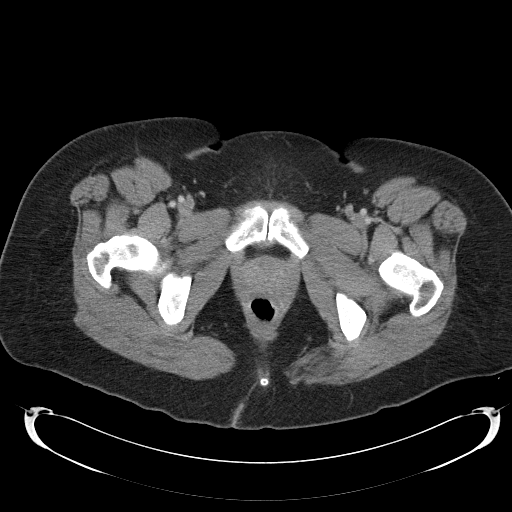
[im 20/98  soft-tissue]
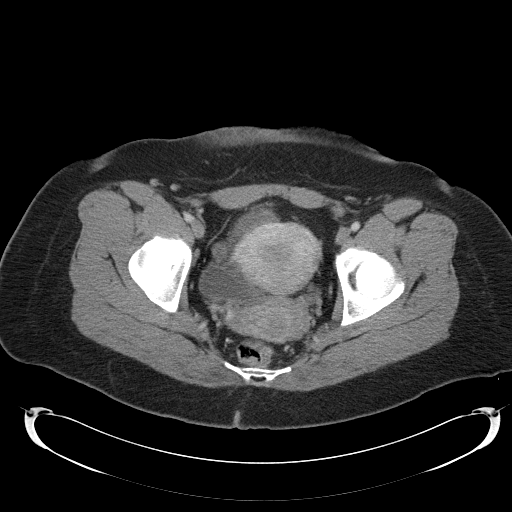
[im 28/98  soft-tissue]
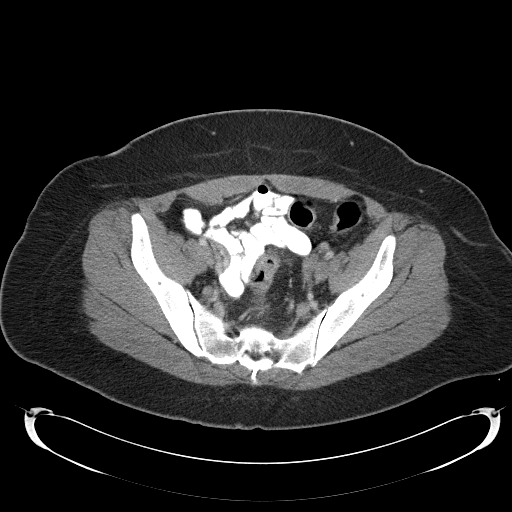
[im 32/98  soft-tissue]
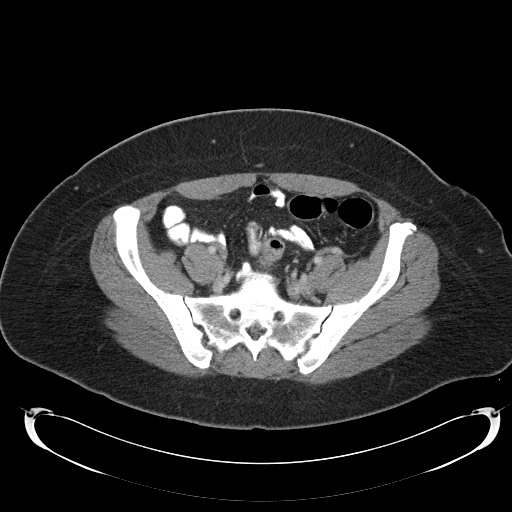
[im 39/98  soft-tissue]
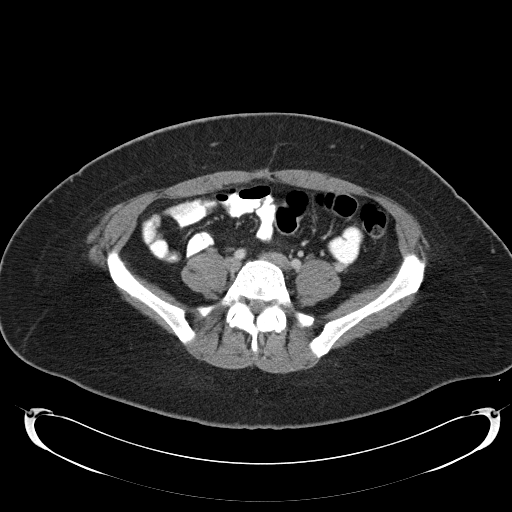
[im 47/98  soft-tissue]
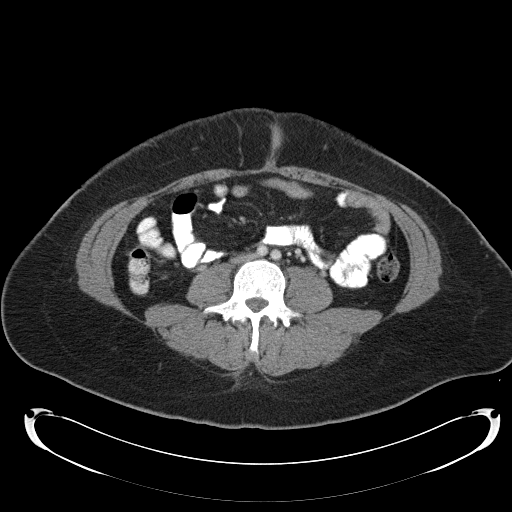
[im 51/98  soft-tissue]
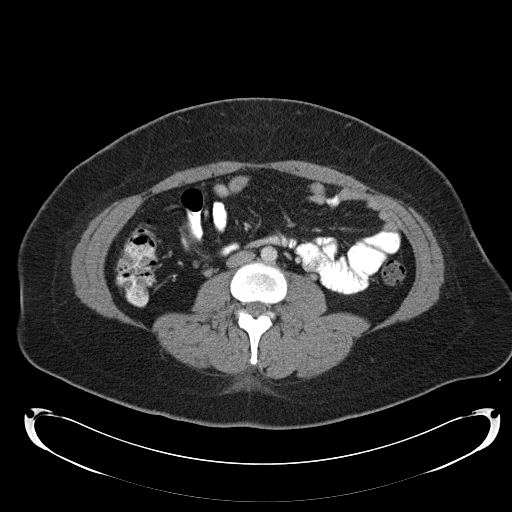
[im 59/98  soft-tissue]
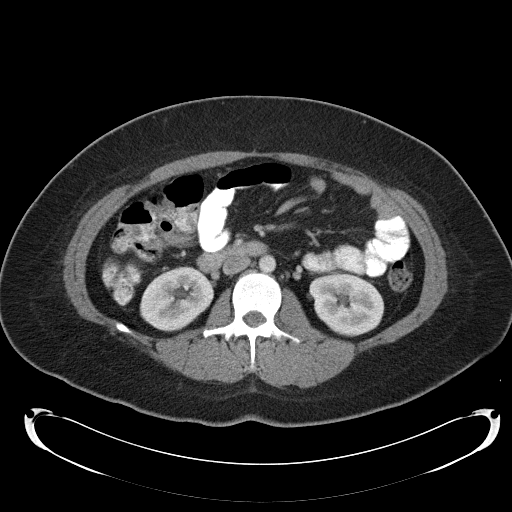
[im 59/98  bone]
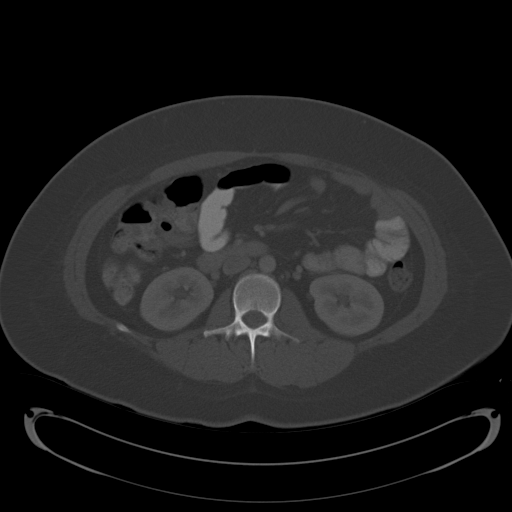
[im 66/98  soft-tissue]
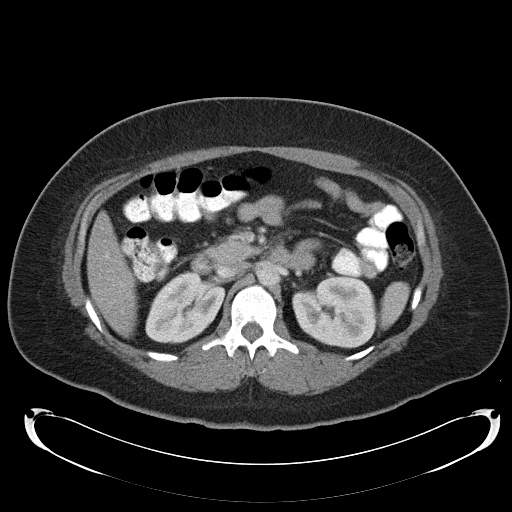
[im 74/98  soft-tissue]
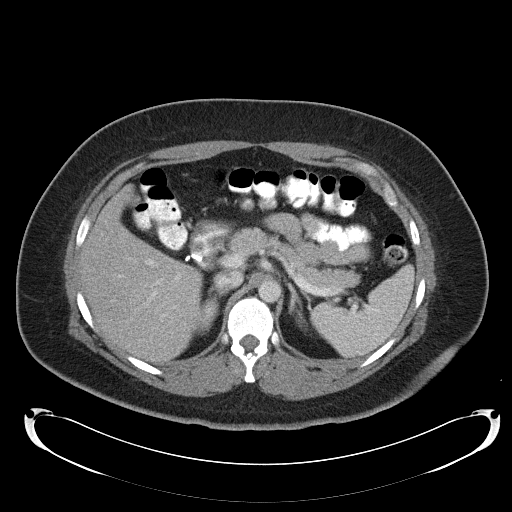
[im 78/98  soft-tissue]
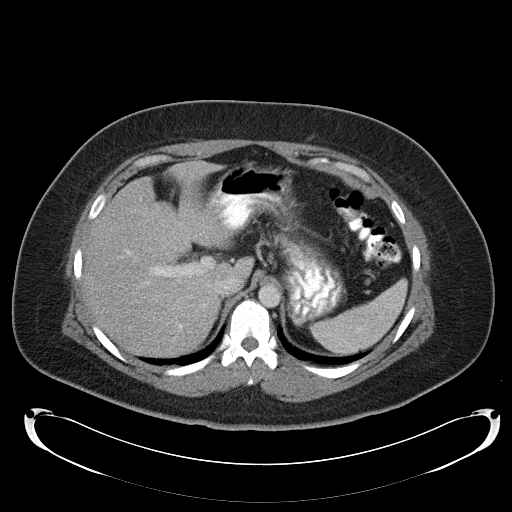
[im 86/98  soft-tissue]
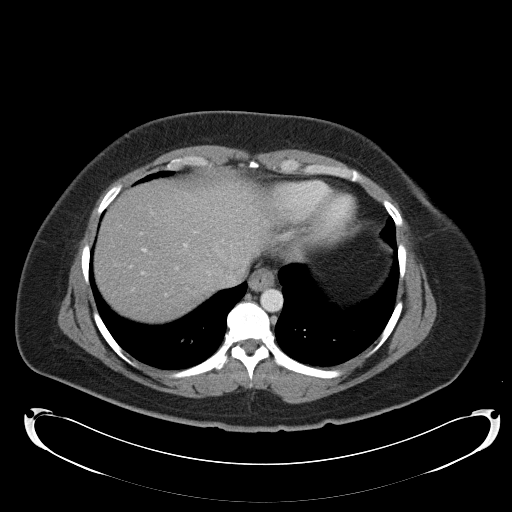
[im 94/98  soft-tissue]
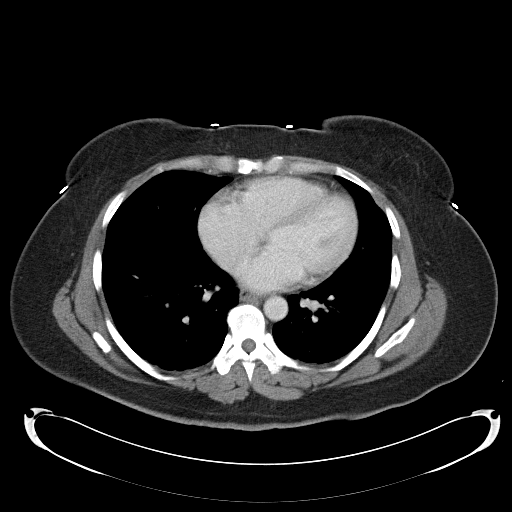

[Series 602: coronal abd/pel · coronal · 0.96mm/px · 3 of 91 slices shown]
[im 31/91  soft-tissue]
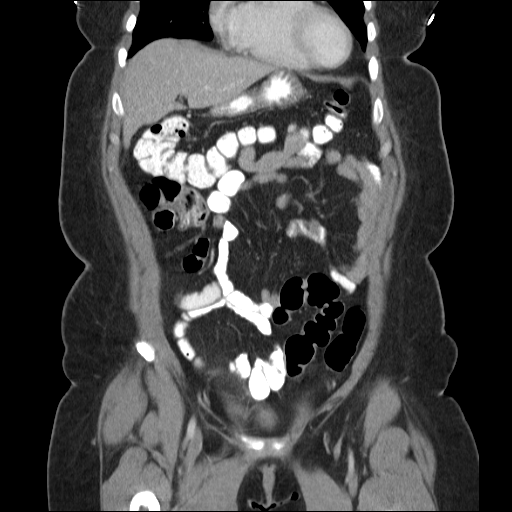
[im 41/91  soft-tissue]
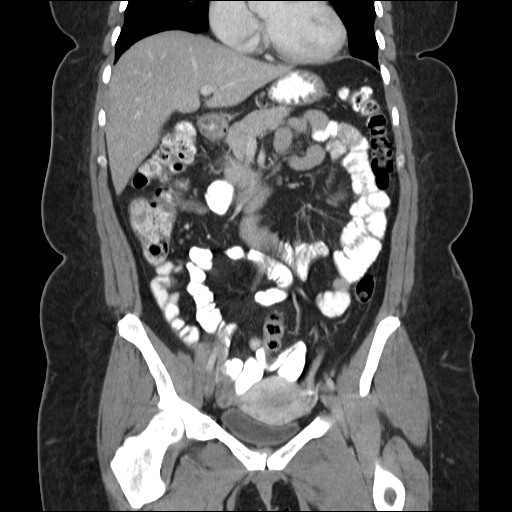
[im 51/91  soft-tissue]
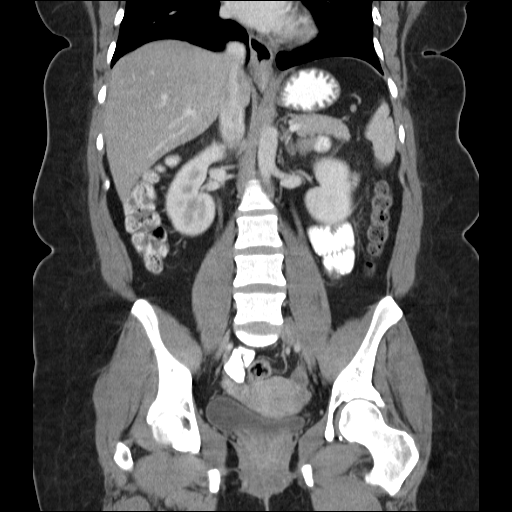

[17 of 46 positions shown; findings below may reference images not displayed]

FINDINGS: Both small and large bowel is well opacified by oral
contrast material on the study.  Although the appendix is not
directly visualized, there is no evidence of inflammatory process
in the area the cecum were elsewhere within the abdomen or pelvis.
No abnormal fluid collections are seen.  No evidence of bowel wall
thickening or dilatation.

There is no evidence of inguinal hernia or lymphadenopathy in the
area of the groin or elsewhere within the abdomen or pelvis.  No
soft tissue masses are seen.  Uterus and adnexa are unremarkable.
Prior bilateral tubal ligation noted.  No soft tissue masses or
lymphadenopathy seen elsewhere within the abdomen or pelvis.

The abdominal parenchymal organs are normal in appearance.
Surgical clips from prior cholecystectomy are seen.  No evidence of
biliary ductal dilatation.
IMPRESSION: Negative.  No evidence of appendicitis or other significant
abnormality.  Previous cholecystectomy and bilateral tubal ligation
noted.

## 2011-12-19 MED ORDER — IOHEXOL 300 MG/ML  SOLN
100.0000 mL | Freq: Once | INTRAMUSCULAR | Status: AC | PRN
  Administered 2011-12-19: 100 mL via INTRAVENOUS

## 2011-12-19 MED ORDER — KCL IN DEXTROSE-NACL 30-5-0.45 MEQ/L-%-% IV SOLN
INTRAVENOUS | Status: DC
  Administered 2011-12-19 (×2): via INTRAVENOUS
  Filled 2011-12-19 (×4): qty 1000

## 2011-12-19 NOTE — Progress Notes (Signed)
Patient ID: Alexandra Norris, female   DOB: Nov 09, 1975, 35 y.o.   MRN: [id number]  General Surgery - [facility]  CT abdomen and pelvis reviewed with Dr. Myles Rosenthal in radiology.  No evidence of acute appendicitis with good opacification of bowel in RLQ.  No evidence pathology in right groin, no hernia, no lymphadenopathy.  No evidence of surgical problem causing RLQ abdominal pain or right groin pain on CT abd / pelvis.  Will check CBC with diff now.  Velora Heckler, MD, [facility] Office: [phone]

## 2011-12-19 NOTE — Progress Notes (Signed)
Patient ID: Alexandra Norris, female   DOB: 1975-06-14, 36 y.o.   MRN: [id number]  General Surgery - [facility] - Progress Note  Subjective: Patient complains of worsening RLQ and right groin pain.  Patient states had emesis last PM and this morning.  No fever or chills.  No BM's.  Objective: Vital signs in last 24 hours: Temp:  [98 F (36.7 C)-99.1 F (37.3 C)] 99.1 F (37.3 C) (07/13 0546) Pulse Rate:  [65-101] 65  (07/13 0546) Resp:  [16-20] 16  (07/13 0546) BP: (72-106)/(46-62) 100/58 mmHg (07/13 0611) SpO2:  [95 %-98 %] 96 % (07/13 0546) Weight:  [203 lb (92.08 kg)] 203 lb (92.08 kg) (07/12 1007) Last BM Date: 12/16/11  Intake/Output from previous day: 07/12 0701 - 07/13 0700 In: 1468.8 [P.O.:240; I.V.:1178.8; IV Piggyback:50] Out: 1 [Urine:1]  Exam: HEENT - clear, not icteric Neck - soft Chest - clear bilaterally Cor - RRR, no murmur Abd - soft, obese, without distension; active BS present; tender to palpation RLQ and groin; no hernia; no mass; no rebound Ext - no significant edema Neuro - grossly intact, no focal deficits  Lab Results:   Basename 12/18/11 0650 12/17/11 0227  WBC 10.1 11.8*  HGB 13.2 13.6  HCT 39.5 40.3  PLT 250 264     Basename 12/18/11 0650 12/17/11 0227  NA 138 138  K 3.7 3.9  CL 104 104  CO2 25 24  GLUCOSE 95 128*  BUN 4* 11  CREATININE 0.53 0.56  CALCIUM 9.0 9.4    Studies/Results: No results found.  Assessment / Plan: 1.  RLQ abdominal pain and right groin pain of undetermined etiology  - will check CBC with diff today  - will obtain CT abdomen with rectal contrast to rule out appendicitis  Velora Heckler, MD, [facility] Office: [phone]  12/19/2011

## 2011-12-20 MED ORDER — HYDROCODONE-ACETAMINOPHEN 5-325 MG PO TABS: 1.0000 | ORAL_TABLET | ORAL | Status: AC | PRN

## 2011-12-20 NOTE — Progress Notes (Signed)
Patient ID: Alexandra Norris, female   DOB: Jul 21, 1975, 36 y.o.   MRN: [id number]  General Surgery - [facility] - Progress Note  Subjective: Patient much improved.  Minimal pain.  Wants to eat.  Wants to go home.  Objective: Vital signs in last 24 hours: Temp:  [97.9 F (36.6 C)-98.1 F (36.7 C)] 98.1 F (36.7 C) (07/14 0532) Pulse Rate:  [59-62] 59  (07/14 0532) Resp:  [18-20] 18  (07/14 0532) BP: (94-98)/(51-61) 98/51 mmHg (07/14 0532) SpO2:  [96 %-98 %] 96 % (07/14 0802) Last BM Date: 12/16/11  Intake/Output from previous day: 07/13 0701 - 07/14 0700 In: 1897 [I.V.:1897] Out: -   Exam: HEENT - clear, not icteric Neck - soft Chest - clear bilaterally Cor - RRR, no murmur Abd - soft, obese, no distension; BS present; minimal RLQ tenderness; no mass Ext - no significant edema Neuro - grossly intact, no focal deficits  Lab Results:   Basename 12/19/11 1454 12/18/11 0650  WBC 9.4 10.1  HGB 13.5 13.2  HCT 40.7 39.5  PLT 255 250     Basename 12/18/11 0650  NA 138  K 3.7  CL 104  CO2 25  GLUCOSE 95  BUN 4*  CREATININE 0.53  CALCIUM 9.0    Studies/Results: Ct Abdomen Pelvis W Contrast  12/19/2011  *RADIOLOGY REPORT*  Clinical Data: Worsening right lower quadrant pain and right groin pain.  CT ABDOMEN AND PELVIS WITH CONTRAST  Technique:  Multidetector CT imaging of the abdomen and pelvis was performed following the standard protocol during bolus administration of intravenous contrast.  Contrast:  100 ml Omnipaque-300 and oral contrast  Comparison: 05/17/2007  Findings: Both small and large bowel is well opacified by oral contrast material on the study.  Although the appendix is not directly visualized, there is no evidence of inflammatory process in the area the cecum were elsewhere within the abdomen or pelvis. No abnormal fluid collections are seen.  No evidence of bowel wall thickening or dilatation.  There is no evidence of inguinal  hernia or lymphadenopathy in the area of the groin or elsewhere within the abdomen or pelvis.  No soft tissue masses are seen.  Uterus and adnexa are unremarkable. Prior bilateral tubal ligation noted.  No soft tissue masses or lymphadenopathy seen elsewhere within the abdomen or pelvis.  The abdominal parenchymal organs are normal in appearance. Surgical clips from prior cholecystectomy are seen.  No evidence of biliary ductal dilatation.  IMPRESSION: Negative.  No evidence of appendicitis or other significant abnormality.  Previous cholecystectomy and bilateral tubal ligation noted.  Original Report Authenticated By: Danae Orleans, M.D.    Assessment / Plan: 1.  RLQ abdominal pain of undetermined etiology  - CT abdomen completely normal - no acute inflammatory process  - CBC normal with normal diff  - will allow regular diet  - discharge home today if diet tolerated  Velora Heckler, MD, [facility] Office: [phone]  12/20/2011

## 2011-12-22 NOTE — Discharge Summary (Signed)
Physician Discharge Summary  Patient ID: Alexandra Norris MRN: [id number] DOB/AGE: Oct 27, 1975 36 y.o.  Admit date: 12/17/2011 Discharge date: 12/22/2011  Admission Diagnoses: RLQ abdominal pain  Discharge Diagnoses:  Active Problems:  * No active hospital problems. *    Discharged Condition: stable  Hospital Course: HPI: 36 yr old female with 3 day history of abdominal in the right lower and mid abdomen with nausea and vomiting. She was not been able to eat or drink Her pain had been getting worse which prompted her to come to the ED. She relates having chills and feeling feverish. She reported some diarrhea. She has a history of cholecystectomy, tubal ligation and 2 c-sections. CT of abdomen results: Negative. No evidence of appendicitis or other significant  abnormality. Previous cholecystectomy and bilateral tubal ligation  noted. Patient was treated with NPO, IVF, and IV antibiotics; NG tube for bowel rest. Her condition improved signifigantly over the course of her stay, she was advanced to regular diet, which was also tolerated well. It was then felt that she was stable for discharge to home with follow-up with our office on a prn basis.  Consults: None  Significant Diagnostic Studies: labs and radiology.  Treatments: IV hydration, antibiotics and analgesia  Discharge Exam: Blood pressure 98/51, pulse 59, temperature 98.1 F (36.7 C), temperature source Oral, resp. rate 18, height 5\' 2"  (1.575 m), weight 203 lb (92.08 kg), last menstrual period 11/26/2011, SpO2 96.00%. General appearance: alert, cooperative and no distress  Disposition: 01-Home or Self Care  Discharge Orders    Future Orders Please Complete By Expires   Diet - low sodium heart healthy      Increase activity slowly      Discharge instructions      Comments:   [facility]  DISCHARGE INSTRUCTIONS TO PATIENT  REMINDER:  Carry a list of your medications and allergies with  you at all times Call your pharmacy at least 1 week in advance to refill prescriptions Do not mix any prescribed pain medicine with alcohol Do not drive any motor vehicles while taking pain medication Take medications with food unless otherwise directed  Follow-up appointments (date to return to physician): Please call [phone] to confirm your follow up appointment with your surgeon.  Call your Surgeon if you have: Temperature greater than 101.0 Persistent nausea and vomiting Severe uncontrolled pain Redness, tenderness, or signs of infection (pain, swelling, redness, odor or green/yellow discharge around the site) Difficulty breathing, headache or visual disturbances Hives Persistent dizziness or light-headedness Any other questions or concerns you may have after discharge  In an emergency, call 911 or go to an Emergency Department at a nearby hospital.   Diet: Begin with liquids, and if they are tolerated, resume your usual diet.  Avoid spicy, greasy or heavy foods.  If you have nausea or vomiting, go back to liquids.  If you cannot keep liquids down, call your doctor.  Avoid alcohol consumption while on prescription pain medications. Good nutrition promotes healing. Increase fiber and fluids.   ADDITIONAL INSTRUCTIONS: Follow up with your primary physician.  Velora Heckler, MD, [facility] Office: [phone]   No wound care        Medication List  As of 12/22/2011 10:29 AM   TAKE these medications         albuterol 90 MCG/ACT inhaler   Commonly known as: PROVENTIL,VENTOLIN   Inhale 2 puffs into the lungs every 4 (four) hours as needed. For wheeze or shortness  of breath      beclomethasone 40 MCG/ACT inhaler   Commonly known as: QVAR   Inhale 1 puff into the lungs 2 (two) times daily. Use everyday as a controller-use even when not wheezing.      HYDROcodone-acetaminophen 5-325 MG per tablet   Commonly known as: NORCO/VICODIN   Take 1-2  tablets by mouth every 4 (four) hours as needed for pain.      ranitidine 150 MG tablet   Commonly known as: ZANTAC   Take 150 mg by mouth 2 (two) times daily.             SignedBlenda Mounts 12/22/2011, 10:29 AM

## 2012-07-30 ENCOUNTER — Encounter (HOSPITAL_COMMUNITY): Payer: Self-pay | Admitting: Emergency Medicine

## 2012-07-30 DIAGNOSIS — Z9851 Tubal ligation status: Secondary | ICD-10-CM | POA: Insufficient documentation

## 2012-07-30 DIAGNOSIS — Z9889 Other specified postprocedural states: Secondary | ICD-10-CM | POA: Insufficient documentation

## 2012-07-30 DIAGNOSIS — R1031 Right lower quadrant pain: Secondary | ICD-10-CM | POA: Insufficient documentation

## 2012-07-30 DIAGNOSIS — R197 Diarrhea, unspecified: Secondary | ICD-10-CM | POA: Insufficient documentation

## 2012-07-30 DIAGNOSIS — R112 Nausea with vomiting, unspecified: Secondary | ICD-10-CM | POA: Insufficient documentation

## 2012-07-30 DIAGNOSIS — J45909 Unspecified asthma, uncomplicated: Secondary | ICD-10-CM | POA: Insufficient documentation

## 2012-07-30 DIAGNOSIS — Z79899 Other long term (current) drug therapy: Secondary | ICD-10-CM | POA: Insufficient documentation

## 2012-07-30 DIAGNOSIS — Z3202 Encounter for pregnancy test, result negative: Secondary | ICD-10-CM | POA: Insufficient documentation

## 2012-07-30 DIAGNOSIS — R300 Dysuria: Secondary | ICD-10-CM | POA: Insufficient documentation

## 2012-07-30 DIAGNOSIS — Z9089 Acquired absence of other organs: Secondary | ICD-10-CM | POA: Insufficient documentation

## 2012-07-30 LAB — URINALYSIS, ROUTINE W REFLEX MICROSCOPIC
Bilirubin Urine: NEGATIVE
Leukocytes, UA: NEGATIVE
Nitrite: NEGATIVE
Specific Gravity, Urine: 1.021 (ref 1.005–1.030)
Urobilinogen, UA: 1 mg/dL (ref 0.0–1.0)

## 2012-07-30 LAB — COMPREHENSIVE METABOLIC PANEL
ALT: 17 U/L (ref 0–35)
Albumin: 3.8 g/dL (ref 3.5–5.2)
Alkaline Phosphatase: 113 U/L (ref 39–117)
GFR calc Af Amer: >90 mL/min (ref >90)
Glucose, Bld: 123 mg/dL — ABNORMAL HIGH (ref 70–99)
Potassium: 3.8 mEq/L (ref 3.5–5.1)
Sodium: 137 mEq/L (ref 135–145)
Total Protein: 7.6 g/dL (ref 6.0–8.3)

## 2012-07-30 LAB — CBC WITH DIFFERENTIAL/PLATELET
Eosinophils Absolute: 0.2 10*3/uL (ref 0.0–0.7)
Lymphs Abs: 2.7 10*3/uL (ref 0.7–4.0)
MCH: 28.5 pg (ref 26.0–34.0)
Neutrophils Relative %: 64 % (ref 43–77)
Platelets: 268 10*3/uL (ref 150–400)
RBC: 4.49 MIL/uL (ref 3.87–5.11)
WBC: 10.5 10*3/uL (ref 4.0–10.5)

## 2012-07-30 NOTE — ED Notes (Signed)
Pt c/o RLQ pain onset 3 days ago with nausea, vomiting and diarrhea.  Pt denies urinary symptoms or vag. discharge

## 2012-07-31 ENCOUNTER — Emergency Department (HOSPITAL_COMMUNITY): Payer: Self-pay

## 2012-07-31 ENCOUNTER — Encounter (HOSPITAL_COMMUNITY): Payer: Self-pay | Admitting: Radiology

## 2012-07-31 ENCOUNTER — Emergency Department (HOSPITAL_COMMUNITY)
Admission: EM | Admit: 2012-07-31 | Discharge: 2012-07-31 | Disposition: A | Discharge status: Home / Self Care | Payer: Self-pay | Attending: Emergency Medicine | Admitting: Emergency Medicine

## 2012-07-31 DIAGNOSIS — R112 Nausea with vomiting, unspecified: Secondary | ICD-10-CM

## 2012-07-31 DIAGNOSIS — R197 Diarrhea, unspecified: Secondary | ICD-10-CM

## 2012-07-31 DIAGNOSIS — R1031 Right lower quadrant pain: Secondary | ICD-10-CM

## 2012-07-31 LAB — WET PREP, GENITAL

## 2012-07-31 IMAGING — CT CT ABD-PELV W/ CM
2 of 4 series · 14 of 32 positions shown, 19 images · IV contrast (water/omni  & 100ml omni 300)
Comparison: [DATE]

CLINICAL DATA: Right lower quadrant pain for 3 days.  Nausea,
vomiting, and diarrhea.

CT ABDOMEN AND PELVIS WITH CONTRAST
TECHNIQUE: Multidetector CT imaging of the abdomen and pelvis was
performed following the standard protocol during bolus
administration of intravenous contrast.
Contrast: 100mL OMNIPAQUE IOHEXOL 300 MG/ML  SOLN

[Series 2: routine abdomen · axial · 0.98mm/px · z∈[-426,-81]mm · 6 of 97 slices shown, 11 images]
[im 14/97  soft-tissue]
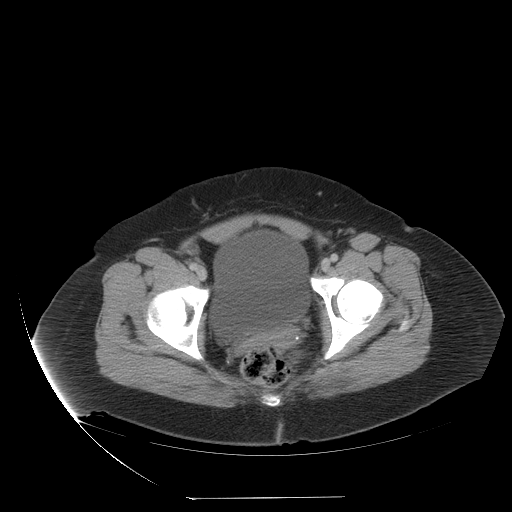
[im 14/97  bone]
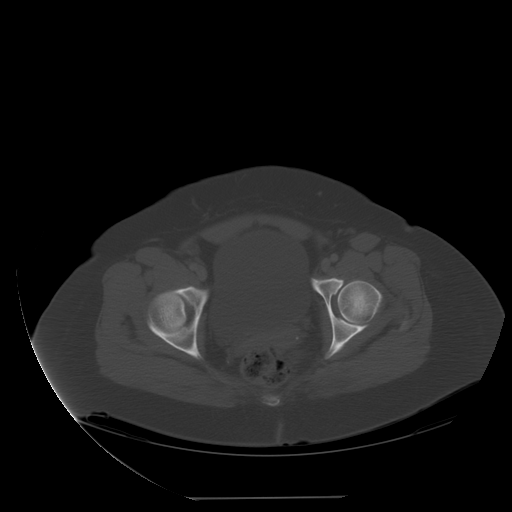
[im 28/97  soft-tissue]
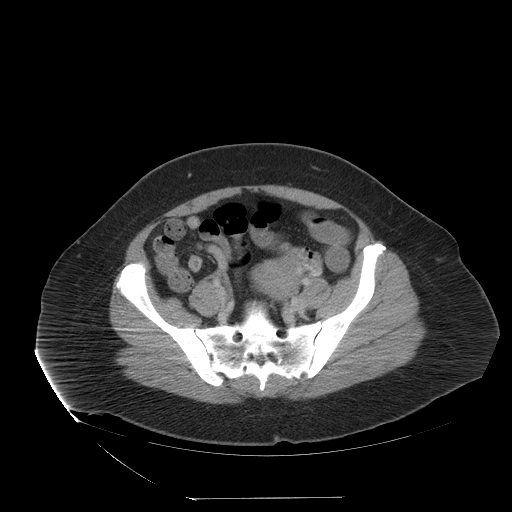
[im 42/97  soft-tissue]
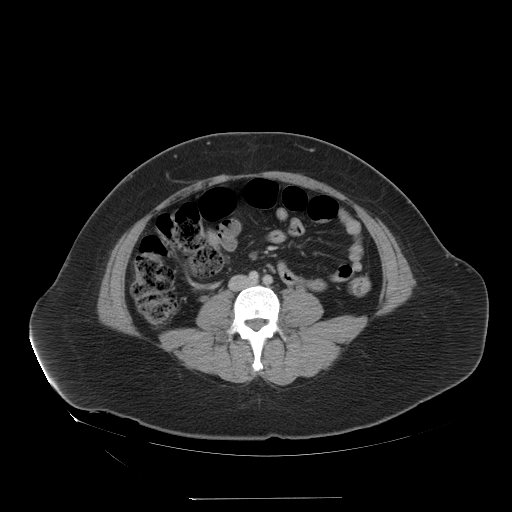
[im 42/97  lung]
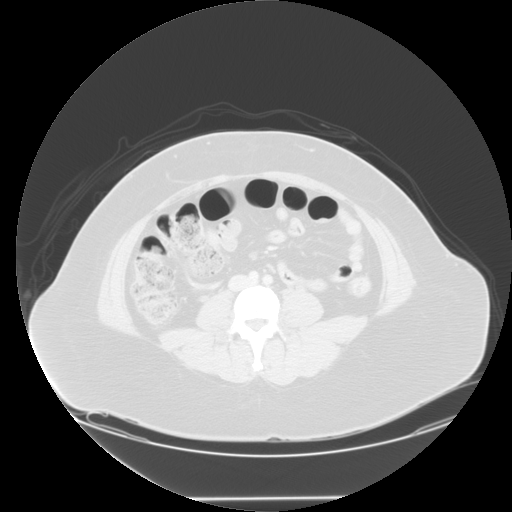
[im 55/97  soft-tissue]
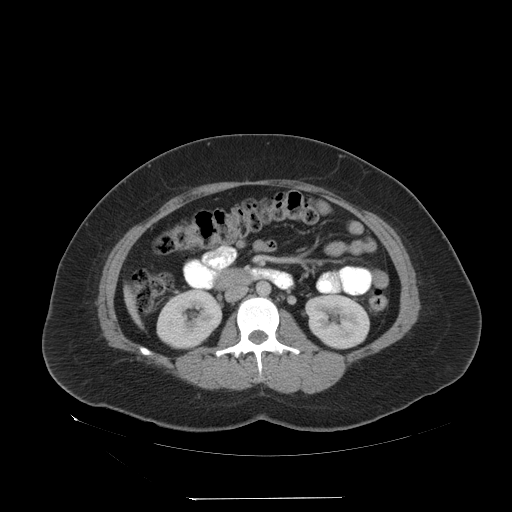
[im 55/97  lung]
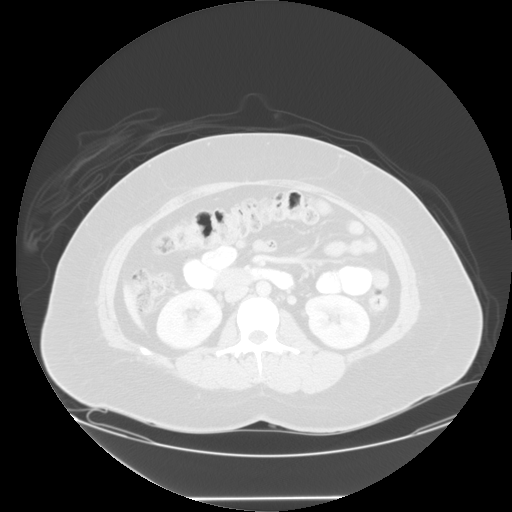
[im 69/97  soft-tissue]
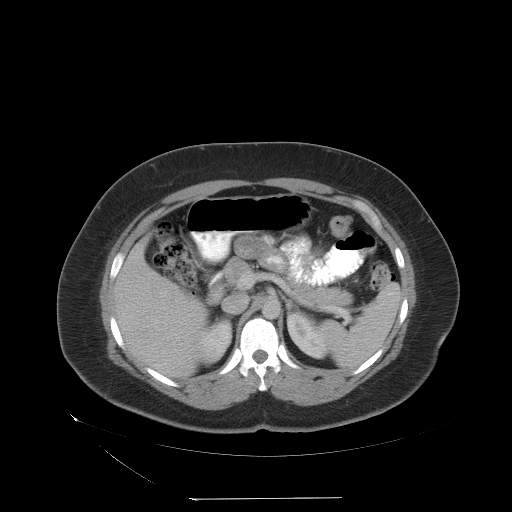
[im 69/97  lung]
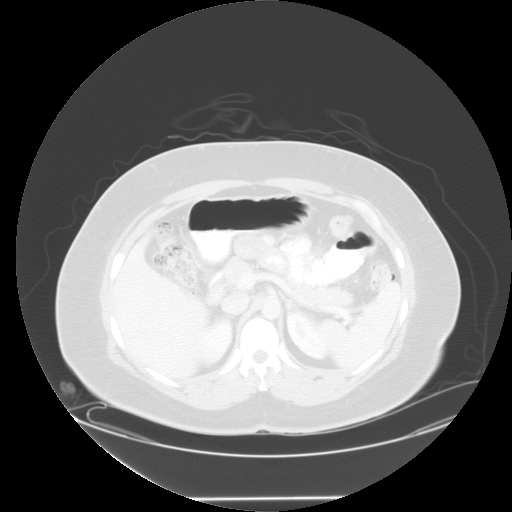
[im 83/97  soft-tissue]
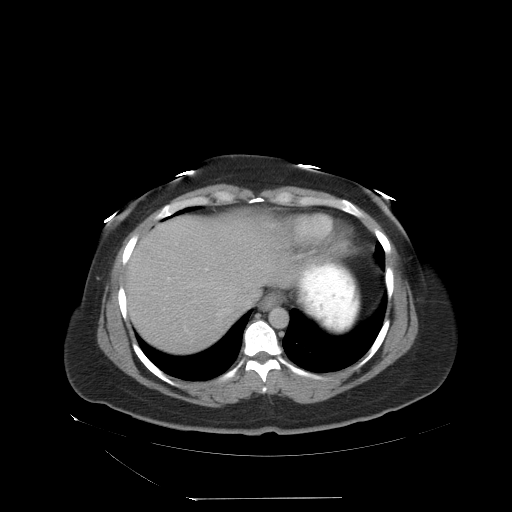
[im 83/97  lung]
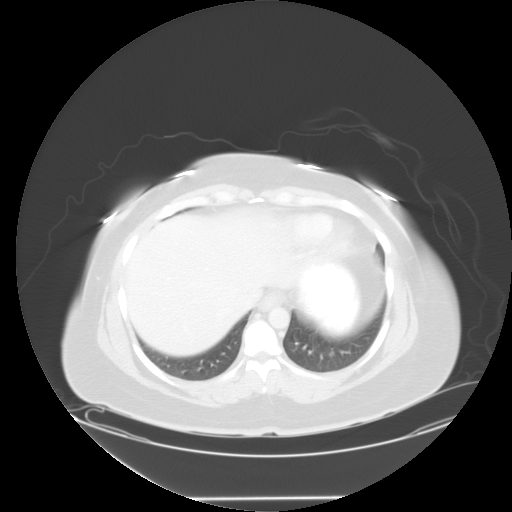

[Series 401: sagittals · sagittal · 0.98mm/px · 8 of 119 slices shown]
[im 11/119  soft-tissue]
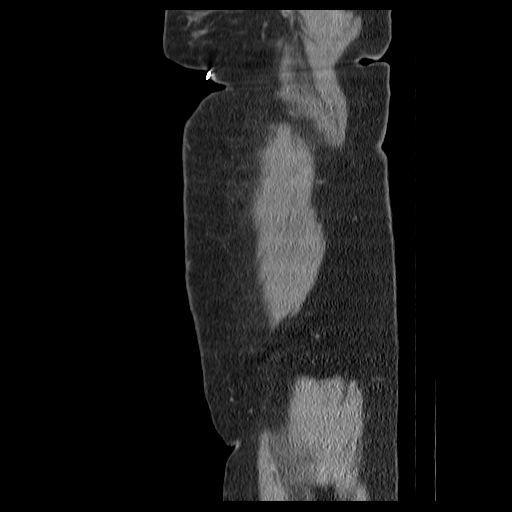
[im 22/119  soft-tissue]
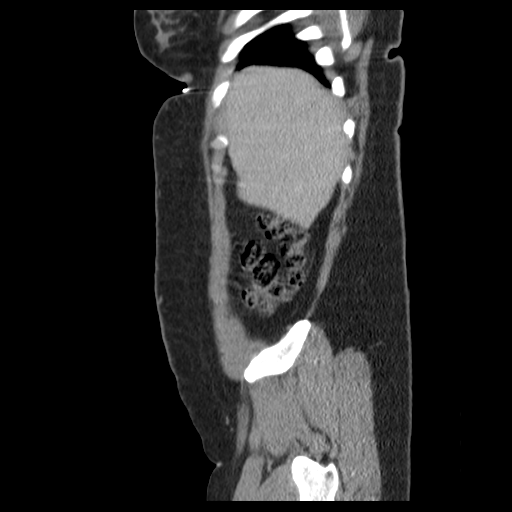
[im 43/119  soft-tissue]
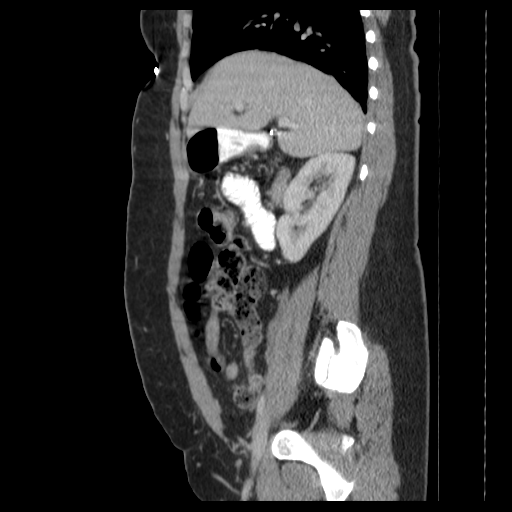
[im 54/119  soft-tissue]
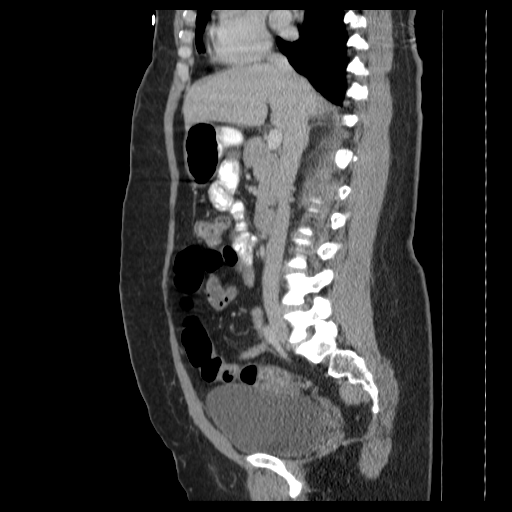
[im 65/119  soft-tissue]
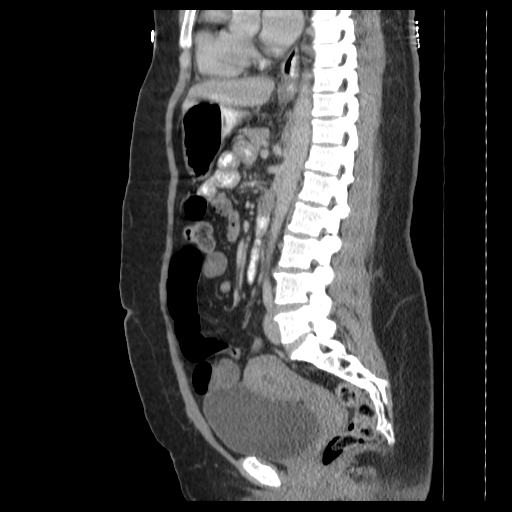
[im 76/119  soft-tissue]
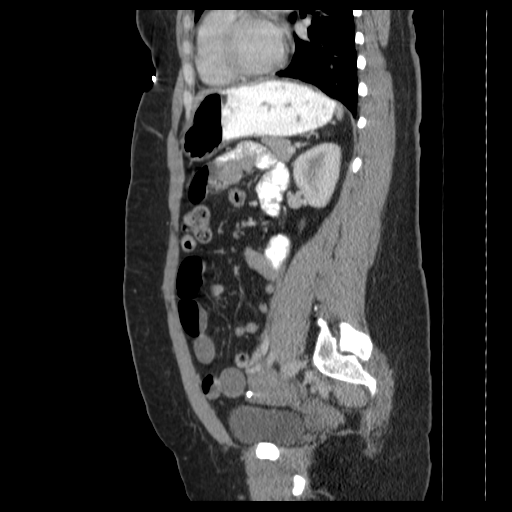
[im 97/119  soft-tissue]
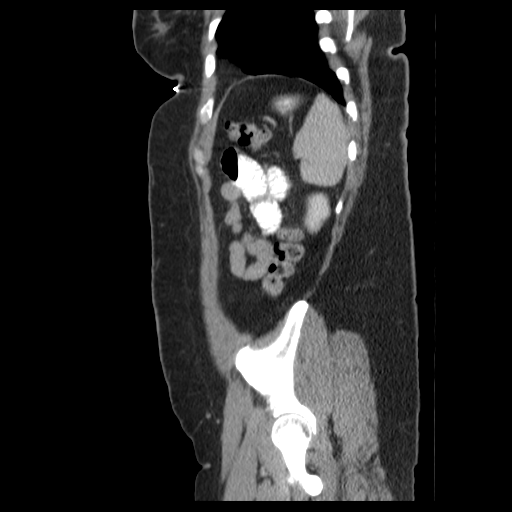
[im 108/119  soft-tissue]
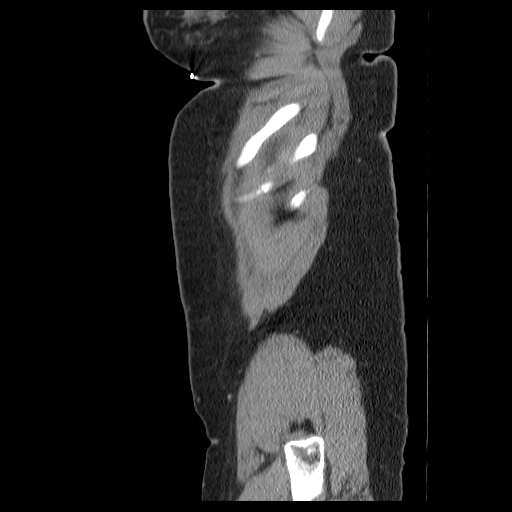

[14 of 32 positions shown; findings below may reference images not displayed]

FINDINGS: The lung bases are clear.

The liver, spleen, pancreas, adrenal glands, kidneys, abdominal
aorta, and retroperitoneal lymph nodes are unremarkable.  Surgical
absence of the gallbladder.  The stomach, small bowel, and colon
are not abnormally distended.  No free air or free fluid in the
abdomen.

Pelvis:  The appendix is segmentally visualized.  Visualized
portions of the appendix appear normal.  No inflammatory changes in
the right lower quadrant.  The uterus and ovaries are not enlarged.
The bladder wall is not thickened.  No free or loculated pelvic
fluid collections.  No significant pelvic lymphadenopathy.  Mild
degenerative changes in the lumbar spine.
IMPRESSION: No acute process demonstrated in the abdomen or pelvis.  No
evidence of appendicitis.

## 2012-07-31 MED ORDER — ONDANSETRON HCL 4 MG/2ML IJ SOLN
4.0000 mg | Freq: Once | INTRAMUSCULAR | Status: AC
  Administered 2012-07-31: 4 mg via INTRAVENOUS
  Filled 2012-07-31: qty 2

## 2012-07-31 MED ORDER — IOHEXOL 300 MG/ML  SOLN
100.0000 mL | Freq: Once | INTRAMUSCULAR | Status: AC | PRN
  Administered 2012-07-31: 100 mL via INTRAVENOUS

## 2012-07-31 MED ORDER — HYDROMORPHONE HCL PF 1 MG/ML IJ SOLN
INTRAMUSCULAR | Status: AC
  Administered 2012-07-31: 0.5 mg via INTRAVENOUS
  Filled 2012-07-31: qty 1

## 2012-07-31 MED ORDER — DEXTROSE 5 % IV SOLN
2.0000 g | Freq: Once | INTRAVENOUS | Status: AC
  Administered 2012-07-31: 2 g via INTRAVENOUS
  Filled 2012-07-31: qty 2

## 2012-07-31 MED ORDER — DICYCLOMINE HCL 10 MG PO CAPS
10.0000 mg | ORAL_CAPSULE | Freq: Once | ORAL | Status: AC
  Administered 2012-07-31: 10 mg via ORAL
  Filled 2012-07-31: qty 1

## 2012-07-31 MED ORDER — IOHEXOL 300 MG/ML  SOLN
50.0000 mL | Freq: Once | INTRAMUSCULAR | Status: AC | PRN
  Administered 2012-07-31: 50 mL via ORAL

## 2012-07-31 MED ORDER — OXYCODONE-ACETAMINOPHEN 5-325 MG PO TABS
2.0000 | ORAL_TABLET | Freq: Once | ORAL | Status: AC
  Administered 2012-07-31: 2 via ORAL
  Filled 2012-07-31: qty 2

## 2012-07-31 MED ORDER — HYDROMORPHONE HCL PF 1 MG/ML IJ SOLN
0.5000 mg | Freq: Once | INTRAMUSCULAR | Status: AC
  Administered 2012-07-31: 0.5 mg via INTRAVENOUS
  Filled 2012-07-31: qty 1

## 2012-07-31 MED ORDER — PROMETHAZINE HCL 25 MG/ML IJ SOLN
12.5000 mg | Freq: Once | INTRAMUSCULAR | Status: AC
  Administered 2012-07-31: 12.5 mg via INTRAVENOUS
  Filled 2012-07-31: qty 1

## 2012-07-31 MED ORDER — HYDROMORPHONE HCL PF 1 MG/ML IJ SOLN
0.5000 mg | INTRAMUSCULAR | Status: AC
  Administered 2012-07-31: 0.5 mg via INTRAVENOUS

## 2012-07-31 MED ORDER — SODIUM CHLORIDE 0.9 % IV BOLUS (SEPSIS)
1000.0000 mL | Freq: Once | INTRAVENOUS | Status: AC
  Administered 2012-07-31: 1000 mL via INTRAVENOUS

## 2012-07-31 MED ORDER — KETOROLAC TROMETHAMINE 15 MG/ML IJ SOLN
15.0000 mg | Freq: Once | INTRAMUSCULAR | Status: AC
  Administered 2012-07-31: 15 mg via INTRAVENOUS
  Filled 2012-07-31: qty 1

## 2012-07-31 MED ORDER — ONDANSETRON 4 MG PO TBDP
4.0000 mg | ORAL_TABLET | Freq: Once | ORAL | Status: AC
  Administered 2012-07-31: 4 mg via ORAL
  Filled 2012-07-31: qty 1

## 2012-07-31 MED ORDER — OXYCODONE-ACETAMINOPHEN 5-325 MG PO TABS: 1.0000 | ORAL_TABLET | ORAL | Status: DC | PRN

## 2012-07-31 MED ORDER — DIPHENHYDRAMINE HCL 50 MG/ML IJ SOLN
25.0000 mg | Freq: Once | INTRAMUSCULAR | Status: AC
  Administered 2012-07-31: 25 mg via INTRAVENOUS
  Filled 2012-07-31: qty 1

## 2012-07-31 MED ORDER — PROMETHAZINE HCL 25 MG PO TABS: 25.0000 mg | ORAL_TABLET | Freq: Four times a day (QID) | ORAL | Status: DC | PRN

## 2012-07-31 MED ORDER — HYDROMORPHONE HCL PF 1 MG/ML IJ SOLN
1.0000 mg | Freq: Once | INTRAMUSCULAR | Status: AC
  Administered 2012-07-31: 1 mg via INTRAVENOUS
  Filled 2012-07-31: qty 1

## 2012-07-31 NOTE — ED Notes (Signed)
Pt sleeping, awakens easily. States she is feeling better. 

## 2012-07-31 NOTE — ED Provider Notes (Signed)
Patient reexamined. She indicates that she is feeling better. No further vomiting. Pain improved. Patient has had a workup and all tests are negative. Symptoms most consistent with viral illness. Patient discharged with prescriptions for symptomatic relief.  Gilda Crease, MD 07/31/12 [phone]

## 2012-07-31 NOTE — ED Notes (Signed)
Pt back from ct stating that she is itching.  Pt also has rash to left thigh.  md aware new orders given.

## 2012-07-31 NOTE — ED Provider Notes (Signed)
History     CSN: [id number]  Arrival date & time 07/30/12  2027   First MD Initiated Contact with Patient 07/31/12 0135      Chief Complaint  Patient presents with  . Abdominal Pain   History obtained per patient and husband, patient understands [place] but speaks it poorly. Husband agrees to translate  HPI Alexandra Norris is a 37 y.o. female with a past medical history significant for asthma and bronchitis which she's been hospitalized for, she's also status post cholecystectomy, cesarean section x2 and tubal ligation. Patient was hospitalized last summer for right lower quadrant pain without a source. She did not have any identifiable appendicitis on CT in her appendix was not seen. Patient presents with 3 days history of the same type symptoms, right lower quadrant pain that is gradually gotten worse and is currently severe, 10 out of 10, sharp, associated with nausea vomiting and some diarrhea. Last vomiting was yesterday. Patient says she eats some carne asada before coming in about 1900, but didn't eat much because she wasn't feeling well.  She is not hungry currently.. Patient denies any vaginal discharge. She says it does hurt when she urinates occasionally. She denies any fevers, chills, chest pain, shortness of breath.  She says it feels just like last year's pain.   Past Medical History  Diagnosis Date  . History of bronchitis     "I've stayed in hospital 2 X w/bronchitis"  . Asthma   . History of blood transfusion 1999    w/vaginal delivery    Past Surgical History  Procedure Laterality Date  . Cholecystectomy  2011  . Cesarean section  1997; 2008  . Tubal ligation  04/2007    No family history on file.  History  Substance Use Topics  . Smoking status: Never Smoker   . Smokeless tobacco: Never Used  . Alcohol Use: No    OB History   Grav Para Term Preterm Abortions TAB SAB Ect Mult Living                  Review of Systems At least 10pt or greater  review of systems completed and are negative except where specified in the HPI.  Allergies  Review of patient's allergies indicates no known allergies.  Home Medications   Current Outpatient Rx  Name  Route  Sig  Dispense  Refill  . ibuprofen (ADVIL,MOTRIN) 200 MG tablet   Oral   Take 600-800 mg by mouth every 6 (six) hours as needed for pain or fever.         Marland Kitchen albuterol (PROVENTIL,VENTOLIN) 90 MCG/ACT inhaler   Inhalation   Inhale 2 puffs into the lungs every 4 (four) hours as needed. For wheeze or shortness of breath           BP 109/50  Pulse 82  Temp(Src) 98.1 F (36.7 C) (Oral)  Resp 16  SpO2 96%  LMP 07/09/2012  Physical Exam  Nursing notes reviewed.  Electronic medical record reviewed. VITAL SIGNS:   Filed Vitals:   07/30/12 2103 07/31/12 0218  BP: 126/64 109/50  Pulse: 82 82  Temp: 98.1 F (36.7 C)   TempSrc: Oral   Resp: 20 16  SpO2: 100% 96%   CONSTITUTIONAL: Awake, oriented, appears non-toxic HENT: Atraumatic, normocephalic, oral mucosa pink and moist, airway patent. Nares patent without drainage. External ears normal. EYES: Conjunctiva clear, EOMI, PERRLA NECK: Trachea midline, non-tender, supple CARDIOVASCULAR: Normal heart rate, Normal rhythm, No murmurs, rubs, gallops PULMONARY/CHEST: Clear  to auscultation, no rhonchi, wheezes, or rales. Symmetrical breath sounds. Non-tender. ABDOMINAL: Non-distended, soft, obese, tenderness to palpation at McBurney's point without rebound/guarding.  BS increased. NEUROLOGIC: Non-focal, moving all four extremities, no gross sensory or motor deficits. EXTREMITIES: No clubbing, cyanosis, or edema SKIN: Warm, Dry, No erythema, No rash  ED Course  Procedures (including critical care time)  Labs Reviewed  WET PREP, GENITAL - Abnormal; Notable for the following:    Clue Cells Wet Prep HPF POC MANY (*)    WBC, Wet Prep HPF POC FEW (*)    All other components within normal limits  COMPREHENSIVE METABOLIC PANEL -  Abnormal; Notable for the following:    Glucose, Bld 123 (*)    Creatinine, Ser 0.49 (*)    All other components within normal limits  GC/CHLAMYDIA PROBE AMP  URINALYSIS, ROUTINE W REFLEX MICROSCOPIC  CBC WITH DIFFERENTIAL  POCT PREGNANCY, URINE   Ct Abdomen Pelvis W Contrast  07/31/2012  *RADIOLOGY REPORT*  Clinical Data: Right lower quadrant pain for 3 days.  Nausea, vomiting, and diarrhea.  CT ABDOMEN AND PELVIS WITH CONTRAST  Technique:  Multidetector CT imaging of the abdomen and pelvis was performed following the standard protocol during bolus administration of intravenous contrast.  Contrast: OMNIPAQUE IOHEXOL 300 MG/ML  SOLN  Comparison: 12/19/2011  Findings: The lung bases are clear.  The liver, spleen, pancreas, adrenal glands, kidneys, abdominal aorta, and retroperitoneal lymph nodes are unremarkable.  Surgical absence of the gallbladder.  The stomach, small bowel, and colon are not abnormally distended.  No free air or free fluid in the abdomen.  Pelvis:  The appendix is segmentally visualized.  Visualized portions of the appendix appear normal.  No inflammatory changes in the right lower quadrant.  The uterus and ovaries are not enlarged. The bladder wall is not thickened.  No free or loculated pelvic fluid collections.  No significant pelvic lymphadenopathy.  Mild degenerative changes in the lumbar spine.  IMPRESSION: No acute process demonstrated in the abdomen or pelvis.  No evidence of appendicitis.   Original Report Authenticated By: Burman Nieves, M.D.      1. Abdominal pain, right lower quadrant   2. Nausea and vomiting   3. Diarrhea       MDM  Alexandra Norris is a 37 y.o. female who is status post bilateral tubal ligation, cholecystectomy, and C-section x2 who presents with right lower quadrant pain with tenderness at McBurney's point. On reviewing prior records, patient had similar presentation last year, however there was no appendix seen on her CT scan.  Appropriate labs have been ordered, did give her a dose of antibiotics based on her presentation.  Patient is not pregnant, labs are unremarkable. No biliary obstruction, no UTI, no blood in the urine suggestive of nephro/ureterolithiasis. White count is 10.5 within normal limits, pregnancy test is negative. Pelvic exam was unremarkable showing some clue cells and white blood cells-without any vaginal itching I do not think his clue cells are true positives and don't represent bacterial vaginosis.  CT scan of the abdomen and pelvis as interpreted by radiologist "no acute process demonstrated in the abdomen or pelvis. No evidence of appendicitis."  I reviewed the CT scan of the abdomen and pelvis, I do not see any inflammatory stranding of the colon or small intestine, there is no signs of obstruction, I see no free fluid.  I agree with radiologist interpretation, patient still has some tenderness in the abdomen - will treat with antiemetics and oral pain medicine.  Patient says after I left in she received oral pain medicines she vomited up her medicine. Patient is afraid to go home because she may have some pain and vomit some more.    Until the patient she likely has a viral gastroenteritis or something similar we'll she will have some vomiting, diarrhea and nausea with some intermittent pain, there is no acute intra-abdominal/intrapelvic emergency at this point.  We'll treat the patient with some more pain medicine and reassess.  Disposition left to Dr. Blinda Leatherwood - plan is to DC with pain medicine and antiemetics with po fluid hydration.         Jones Skene, MD 07/31/12 [phone]

## 2012-07-31 NOTE — ED Notes (Signed)
Report from James, RN 

## 2012-08-02 LAB — GC/CHLAMYDIA PROBE AMP
CT Probe RNA: NEGATIVE
GC Probe RNA: NEGATIVE

## 2012-09-12 ENCOUNTER — Emergency Department (HOSPITAL_COMMUNITY)
Admission: EM | Admit: 2012-09-12 | Discharge: 2012-09-12 | Disposition: A | Discharge status: Home / Self Care | Payer: Self-pay | Attending: Emergency Medicine | Admitting: Emergency Medicine

## 2012-09-12 ENCOUNTER — Emergency Department (HOSPITAL_COMMUNITY): Payer: Self-pay

## 2012-09-12 ENCOUNTER — Encounter (HOSPITAL_COMMUNITY): Payer: Self-pay | Admitting: Nurse Practitioner

## 2012-09-12 DIAGNOSIS — R509 Fever, unspecified: Secondary | ICD-10-CM | POA: Insufficient documentation

## 2012-09-12 DIAGNOSIS — J45909 Unspecified asthma, uncomplicated: Secondary | ICD-10-CM | POA: Insufficient documentation

## 2012-09-12 DIAGNOSIS — R1031 Right lower quadrant pain: Secondary | ICD-10-CM | POA: Insufficient documentation

## 2012-09-12 DIAGNOSIS — R112 Nausea with vomiting, unspecified: Secondary | ICD-10-CM | POA: Insufficient documentation

## 2012-09-12 DIAGNOSIS — Z9889 Other specified postprocedural states: Secondary | ICD-10-CM | POA: Insufficient documentation

## 2012-09-12 DIAGNOSIS — Z79899 Other long term (current) drug therapy: Secondary | ICD-10-CM | POA: Insufficient documentation

## 2012-09-12 DIAGNOSIS — Z8709 Personal history of other diseases of the respiratory system: Secondary | ICD-10-CM | POA: Insufficient documentation

## 2012-09-12 DIAGNOSIS — Z3202 Encounter for pregnancy test, result negative: Secondary | ICD-10-CM | POA: Insufficient documentation

## 2012-09-12 LAB — CBC WITH DIFFERENTIAL/PLATELET
Basophils Absolute: 0 10*3/uL (ref 0.0–0.1)
Basophils Relative: 0 % (ref 0–1)
Eosinophils Absolute: 0.2 10*3/uL (ref 0.0–0.7)
Eosinophils Relative: 1 % (ref 0–5)
HCT: 39.4 % (ref 36.0–46.0)
MCH: 27.9 pg (ref 26.0–34.0)
MCHC: 34.5 g/dL (ref 30.0–36.0)
MCV: 80.7 fL (ref 78.0–100.0)
Monocytes Absolute: 0.8 10*3/uL (ref 0.1–1.0)
Platelets: 267 10*3/uL (ref 150–400)
RDW: 13.4 % (ref 11.5–15.5)

## 2012-09-12 LAB — URINALYSIS, MICROSCOPIC ONLY
Bilirubin Urine: NEGATIVE
Ketones, ur: NEGATIVE mg/dL
Nitrite: NEGATIVE
Protein, ur: NEGATIVE mg/dL
Urobilinogen, UA: 1 mg/dL (ref 0.0–1.0)
pH: 6 (ref 5.0–8.0)

## 2012-09-12 LAB — WET PREP, GENITAL
Clue Cells Wet Prep HPF POC: NONE SEEN
Trich, Wet Prep: NONE SEEN

## 2012-09-12 LAB — COMPREHENSIVE METABOLIC PANEL
ALT: 14 U/L (ref 0–35)
Albumin: 4.1 g/dL (ref 3.5–5.2)
Alkaline Phosphatase: 112 U/L (ref 39–117)
Chloride: 105 mEq/L (ref 96–112)
Potassium: 4 mEq/L (ref 3.5–5.1)
Sodium: 139 mEq/L (ref 135–145)
Total Bilirubin: 0.6 mg/dL (ref 0.3–1.2)
Total Protein: 8 g/dL (ref 6.0–8.3)

## 2012-09-12 IMAGING — US US ART/VEN ABD/PELV/SCROTUM DOPPLER LTD
1 series · 13 of 25 positions shown · non-contrast
Comparison: CT abdomen pelvis from the same day.

CLINICAL DATA: Right lower quadrant abdominal pain.  The patient
has had two C sections.

TRANSABDOMINAL AND TRANSVAGINAL ULTRASOUND OF PELVIS
DOPPLER ULTRASOUND OF OVARIES
TECHNIQUE: Both transabdominal and transvaginal ultrasound
examinations of the pelvis were performed. Transabdominal technique
was performed for global imaging of the pelvis including uterus,
ovaries, adnexal regions, and pelvic cul-de-sac.
It was necessary to proceed with endovaginal exam following the
transabdominal exam to visualize the ovaries.
Color and duplex Doppler ultrasound was utilized to evaluate blood
flow to the ovaries.

[Series 1: us art/ven abd/pelv/scrotum doppler ltd · 0.28mm/px · 13 of 83 slices shown]
[im 1/83]
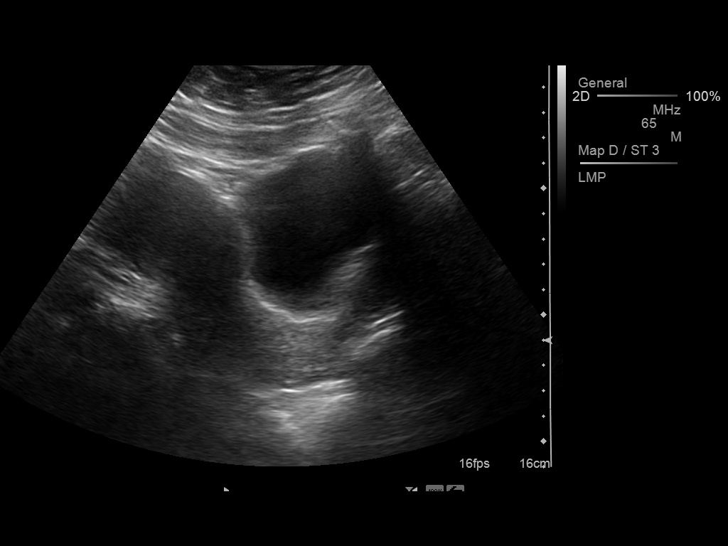
[im 7/83]
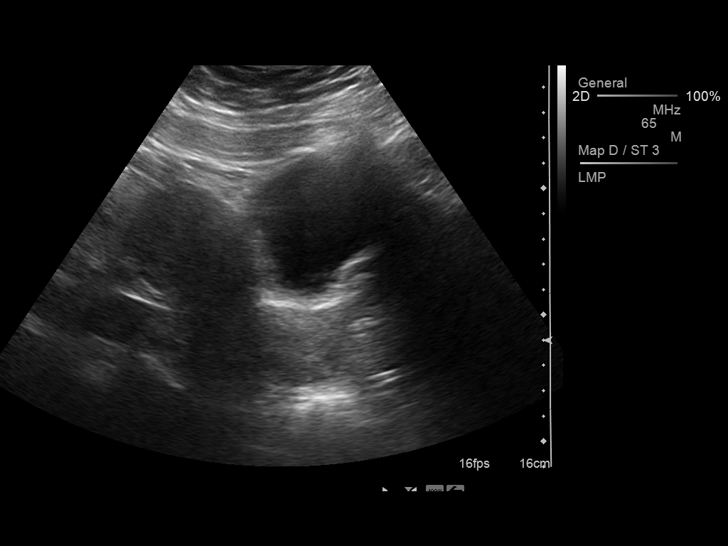
[im 14/83]
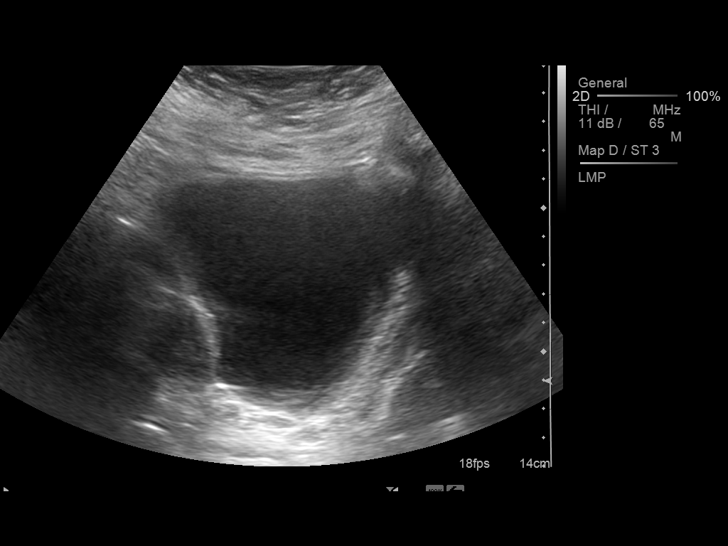
[im 21/83]
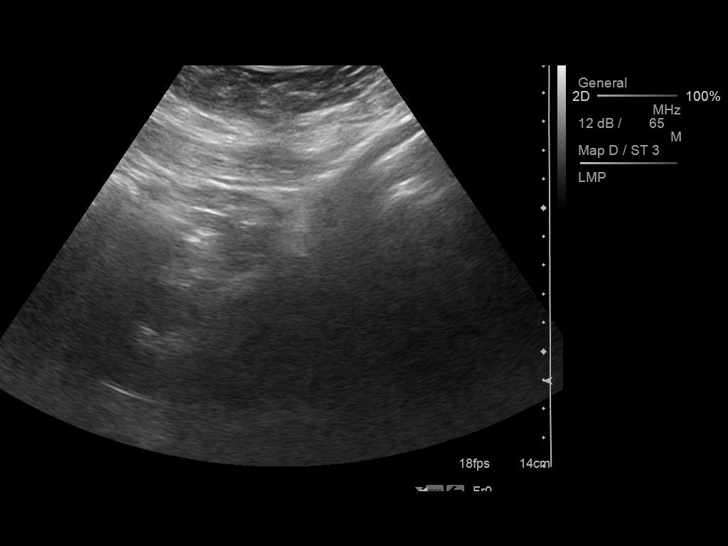
[im 28/83]
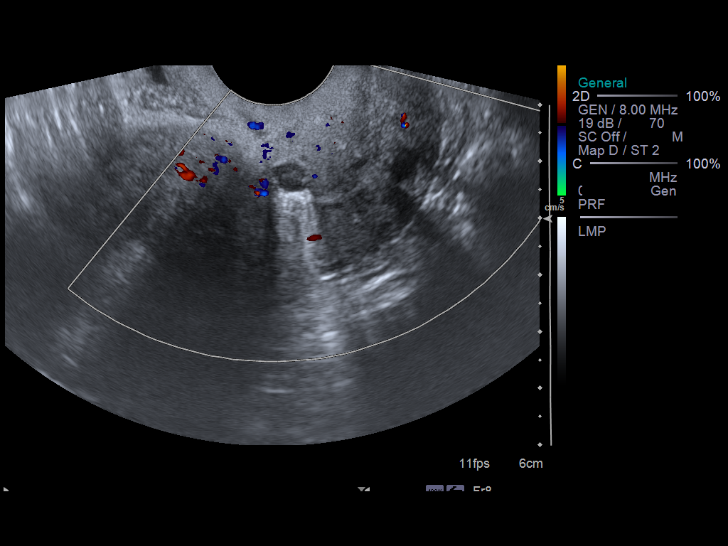
[im 35/83]
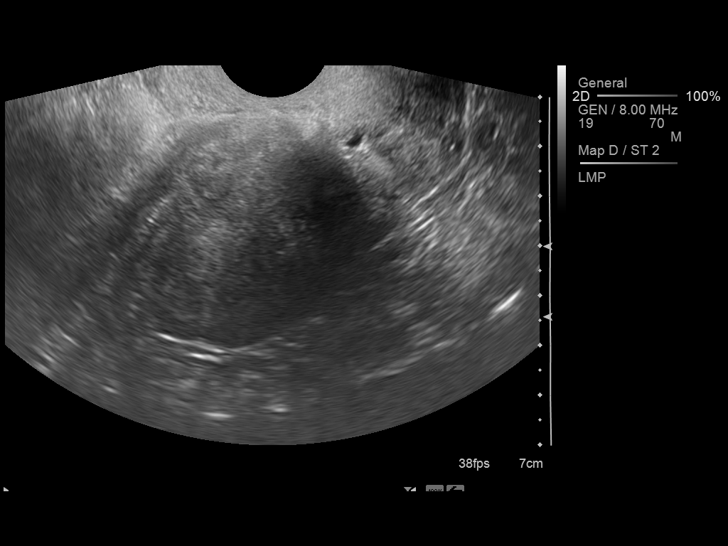
[im 42/83]
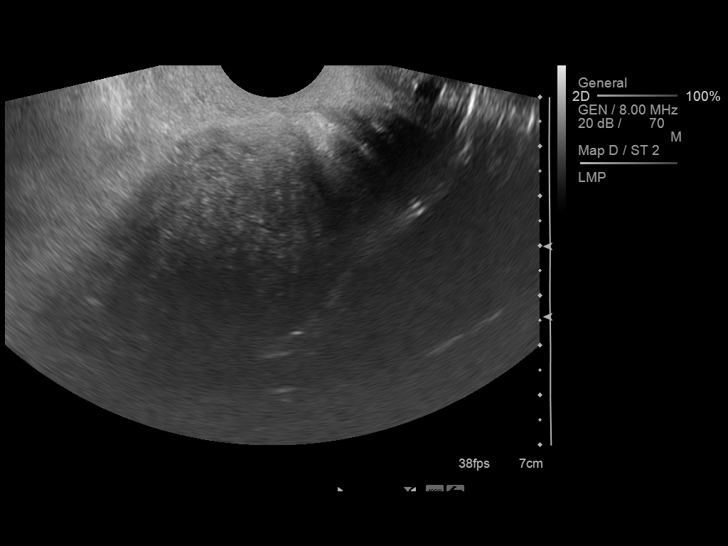
[im 48/83]
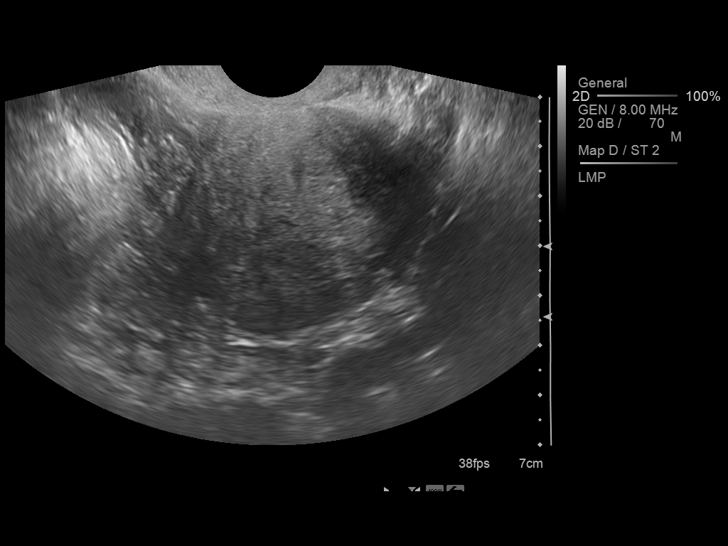
[im 55/83]
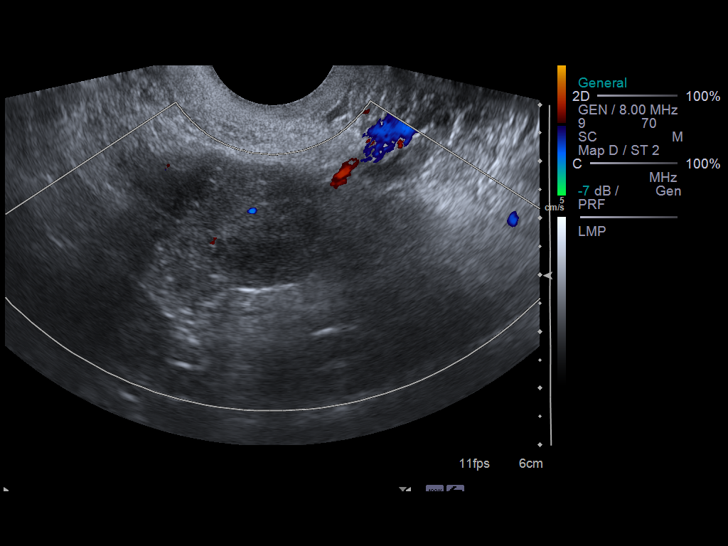
[im 62/83]
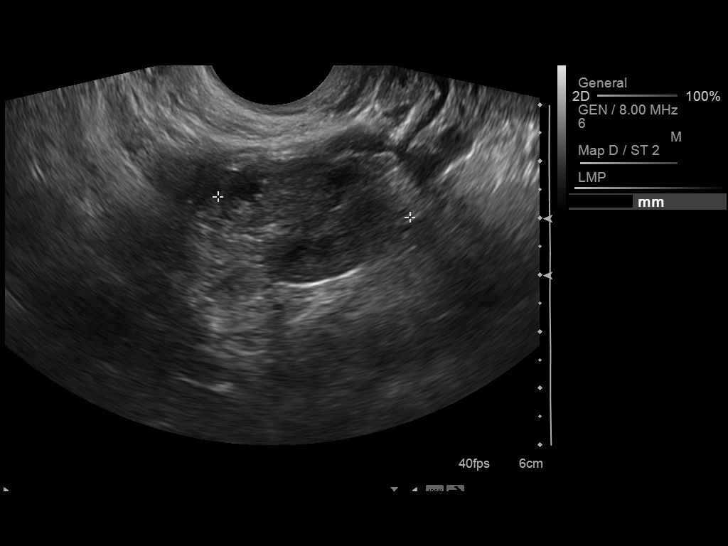
[im 69/83]
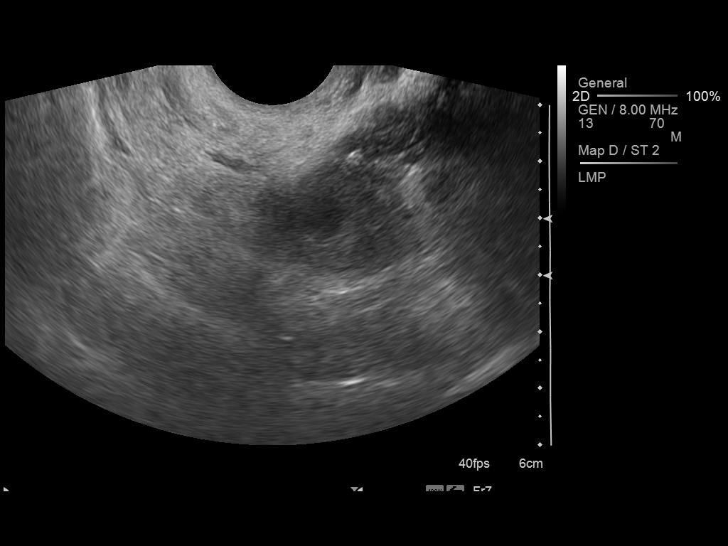
[im 76/83]
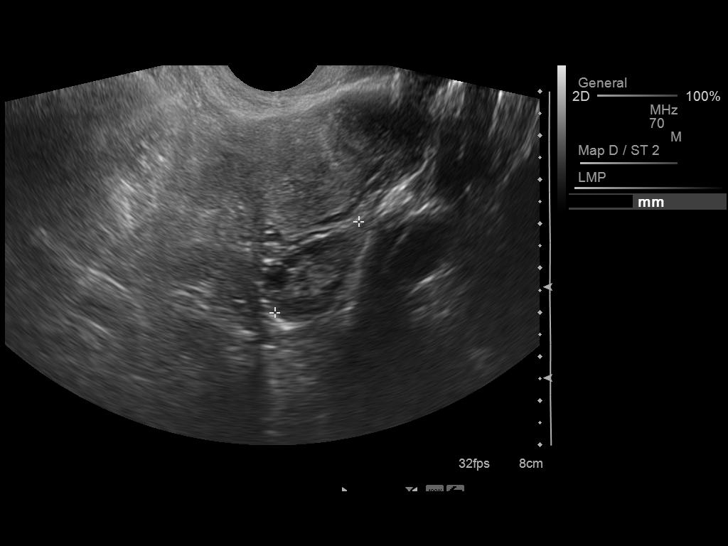
[im 83/83]
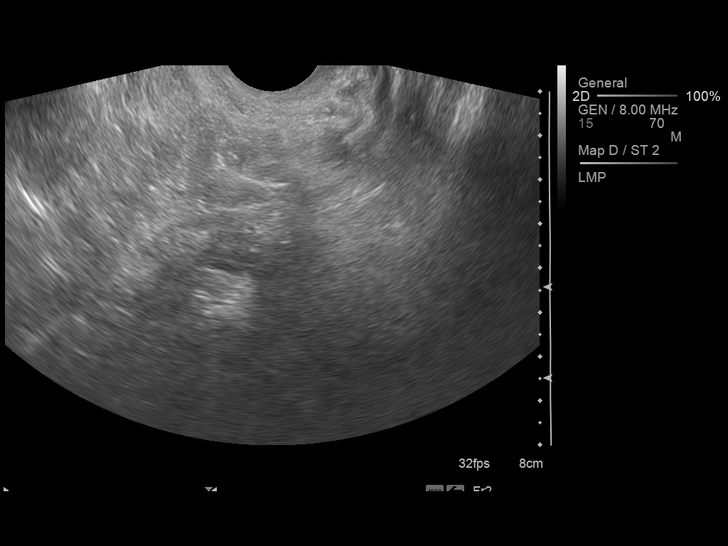

[13 of 25 positions shown; findings below may reference images not displayed]

FINDINGS: Uterus:  Uterus is diffusely heterogeneous.  No discrete to
fibroids are evident.  Measures 8.8 x 4.8 x 5.2 cm.

Endometrium:  Normal thickness and appearance, measuring 6.3 mm,
within normal limits.

Right ovary: Normal size and echotexture.  The right ovary measures
3.4 x 2.7 x 2.8 cm.

Left ovary:   Normal size and echotexture.  The left ovary measures
2.8 x 2.0 x 2.2 cm.

Pulsed Doppler evaluation demonstrates normal low-resistance
arterial and venous waveforms in both ovaries.
IMPRESSION: 1.  The uterus is somewhat heterogeneous which may represent edema
or small fibroids.  No discrete lesions are evident.
2.  The ovaries are within normal limits.

No sonographic evidence for ovarian torsion.

## 2012-09-12 IMAGING — CT CT ABD-PELV W/ CM
2 of 4 series · 17 of 46 positions shown, 19 images · IV contrast (APPLIED)
Comparison: [DATE]

CLINICAL DATA: Right lower quadrant abdominal pain, nausea and
vomiting.

CT ABDOMEN AND PELVIS WITH CONTRAST
TECHNIQUE: Multidetector CT imaging of the abdomen and pelvis was
performed following the standard protocol during bolus
administration of intravenous contrast.
Contrast: 100mL OMNIPAQUE IOHEXOL 300 MG/ML  SOLN

[Series 2: abd/pelv with 5.0 b31f st · axial · 0.92mm/px · z∈[-511,-66]mm · 14 of 97 slices shown, 16 images]
[im 4/97  soft-tissue]
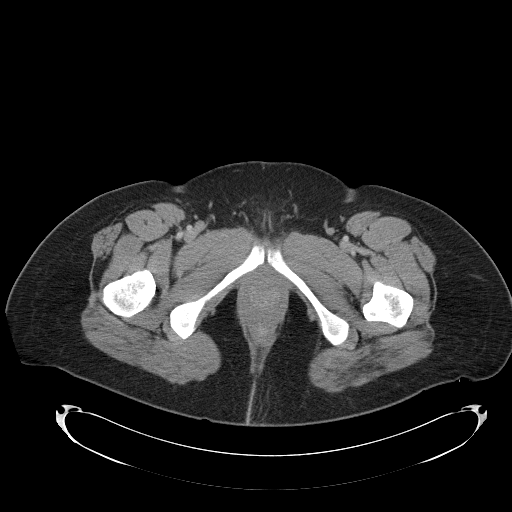
[im 4/97  bone]
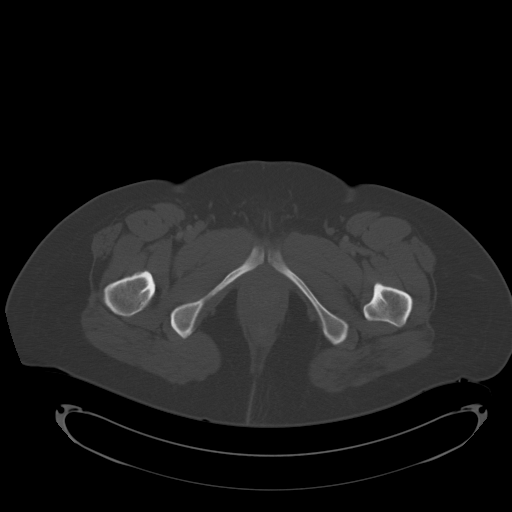
[im 12/97  soft-tissue]
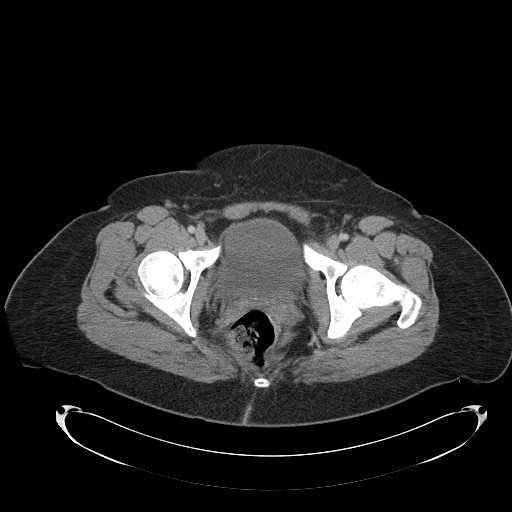
[im 20/97  soft-tissue]
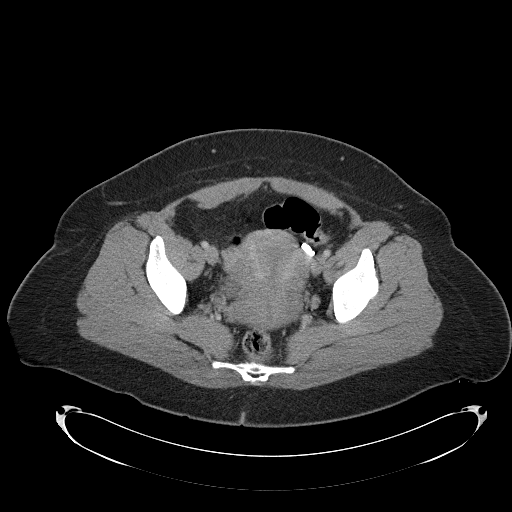
[im 27/97  soft-tissue]
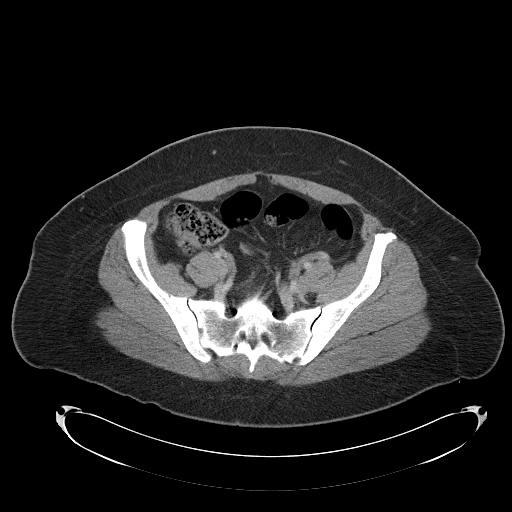
[im 31/97  soft-tissue]
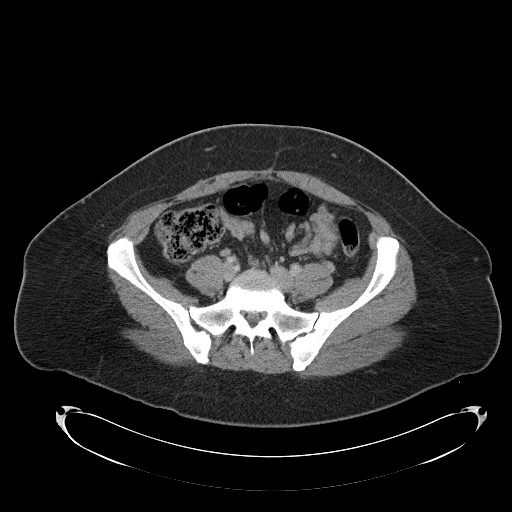
[im 39/97  soft-tissue]
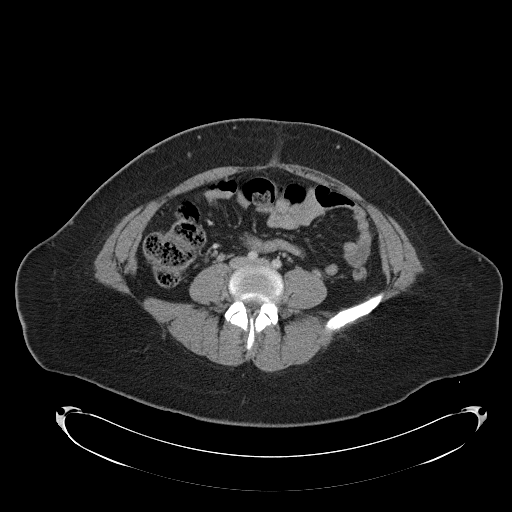
[im 47/97  soft-tissue]
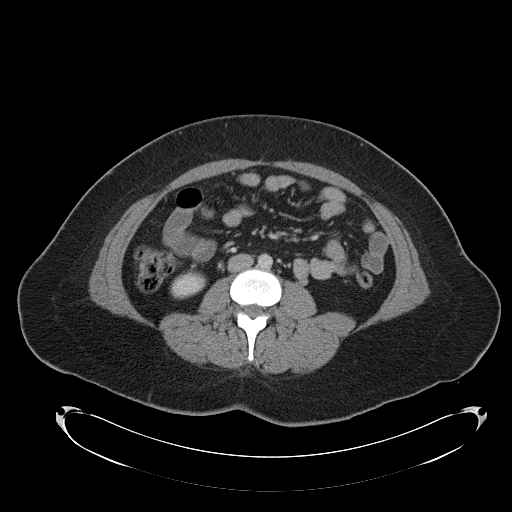
[im 50/97  soft-tissue]
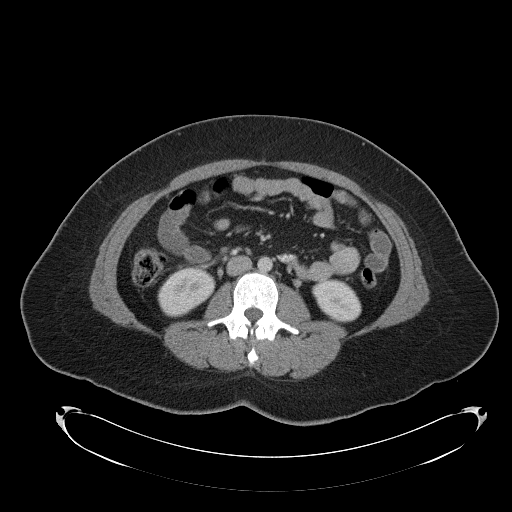
[im 58/97  soft-tissue]
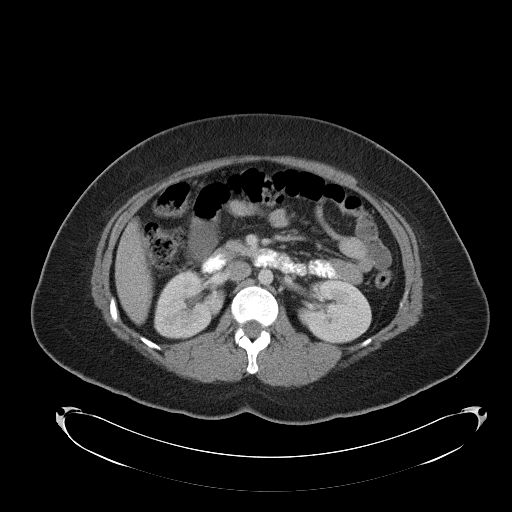
[im 58/97  bone]
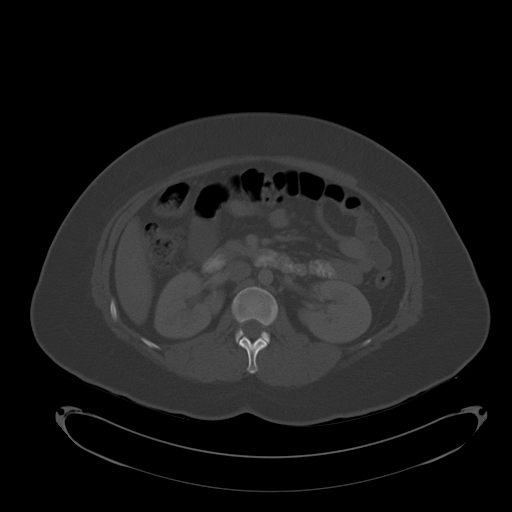
[im 66/97  soft-tissue]
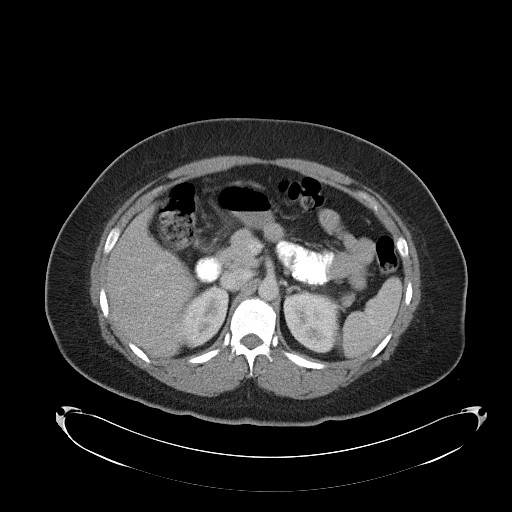
[im 73/97  soft-tissue]
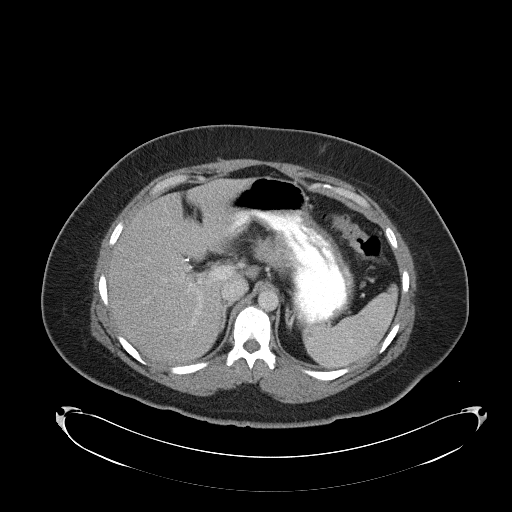
[im 77/97  soft-tissue]
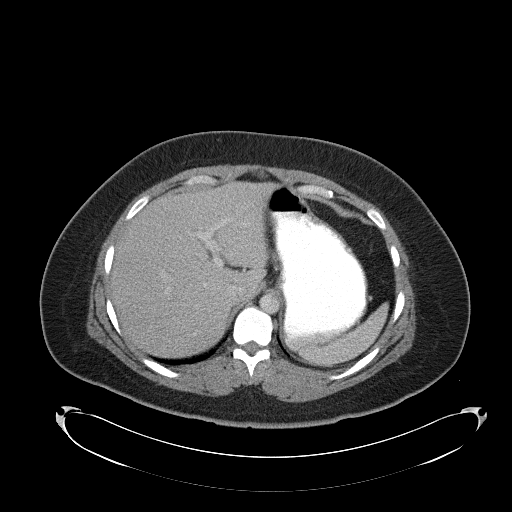
[im 85/97  soft-tissue]
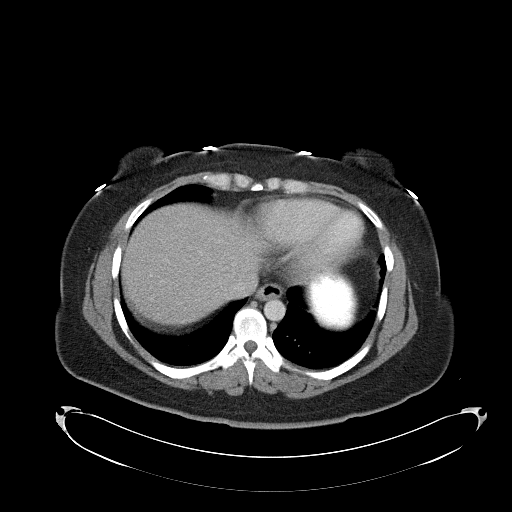
[im 93/97  soft-tissue]
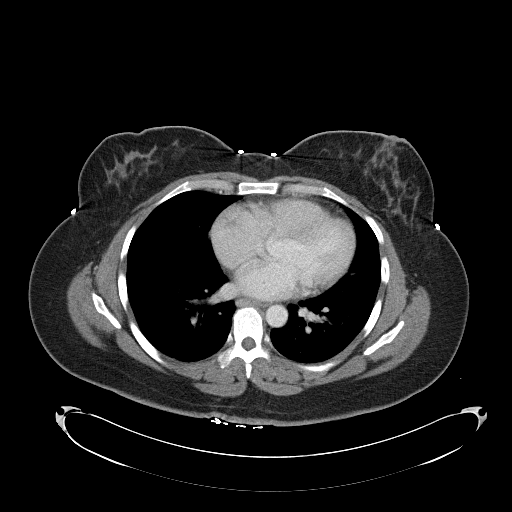

[Series 5: coronals · coronal · 0.94mm/px · 3 of 121 slices shown]
[im 41/121  soft-tissue]
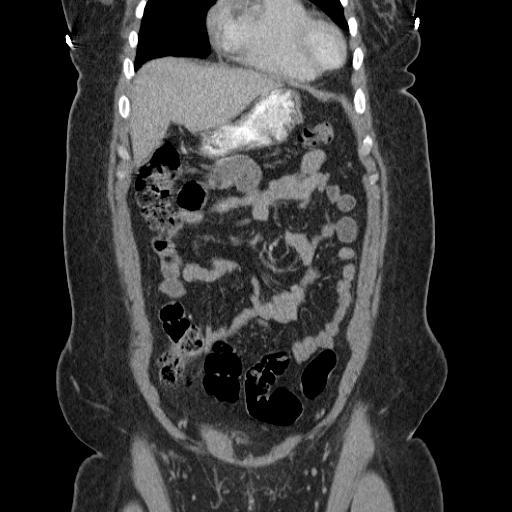
[im 54/121  soft-tissue]
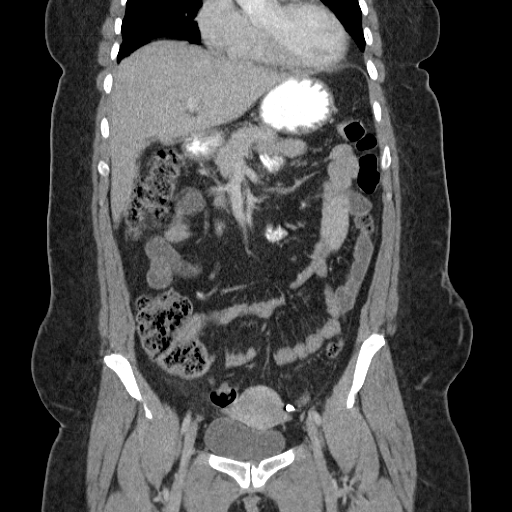
[im 67/121  soft-tissue]
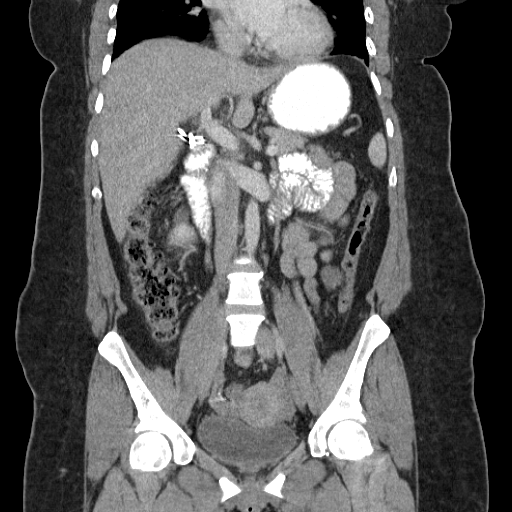

[17 of 46 positions shown; findings below may reference images not displayed]

FINDINGS: The appendix is difficult to discretely visualize.  There
clearly is no evidence by CT of acute appendicitis.

The liver, pancreas, spleen, adrenal glands and kidneys are within
normal limits.  The gallbladder has been removed.  No biliary
ductal dilatation is identified.

No free fluid or abscess is seen.  No masses or enlarged lymph
nodes.  No hernias are identified.  The bladder, uterus and adnexal
regions are unremarkable by CT.  Clips are seen related to prior
tubal ligation.  Bony structures are unremarkable.
IMPRESSION: No acute abnormalities are identified.  No evidence of acute
appendicitis by CT.

## 2012-09-12 MED ORDER — OXYCODONE-ACETAMINOPHEN 5-325 MG PO TABS: 1.0000 | ORAL_TABLET | ORAL | Status: DC | PRN

## 2012-09-12 MED ORDER — HYDROMORPHONE HCL PF 1 MG/ML IJ SOLN
0.5000 mg | Freq: Once | INTRAMUSCULAR | Status: AC
  Administered 2012-09-12: 0.5 mg via INTRAVENOUS
  Filled 2012-09-12: qty 1

## 2012-09-12 MED ORDER — ONDANSETRON HCL 4 MG/2ML IJ SOLN
4.0000 mg | Freq: Once | INTRAMUSCULAR | Status: AC
  Administered 2012-09-12: 4 mg via INTRAVENOUS
  Filled 2012-09-12: qty 2

## 2012-09-12 MED ORDER — IOHEXOL 300 MG/ML  SOLN
50.0000 mL | Freq: Once | INTRAMUSCULAR | Status: AC | PRN
  Administered 2012-09-12: 50 mL via ORAL

## 2012-09-12 MED ORDER — ONDANSETRON HCL 4 MG PO TABS: 4.0000 mg | ORAL_TABLET | Freq: Four times a day (QID) | ORAL | Status: DC | PRN

## 2012-09-12 MED ORDER — SODIUM CHLORIDE 0.9 % IV BOLUS (SEPSIS)
1000.0000 mL | Freq: Once | INTRAVENOUS | Status: AC
  Administered 2012-09-12: 1000 mL via INTRAVENOUS

## 2012-09-12 MED ORDER — MORPHINE SULFATE 4 MG/ML IJ SOLN
6.0000 mg | Freq: Once | INTRAMUSCULAR | Status: AC
  Administered 2012-09-12: 6 mg via INTRAVENOUS
  Filled 2012-09-12: qty 2

## 2012-09-12 MED ORDER — IOHEXOL 300 MG/ML  SOLN
100.0000 mL | Freq: Once | INTRAMUSCULAR | Status: AC | PRN
  Administered 2012-09-12: 100 mL via INTRAVENOUS

## 2012-09-12 NOTE — ED Notes (Signed)
C/o severe RLQ abd pain and n/v since Friday.

## 2012-09-12 NOTE — Discharge Instructions (Signed)
Recommend follow up with a GI doctor regarding your symptoms. You should also follow up with a primary care doctor and OBGYN. Take percocet as needed for pain. Do not drive or drink alcohol after taking this medication as it may make you drowsy. Take Zofran as needed for nausea. Return to the ED if symptoms worsen.   Dolor abdominal en las mujeres (Abdominal Pain, Women) El dolor abdominal (en el estmago, la pelvis o el vientre) puede tener muchas causas. Es importante que le informe a su mdico:  La ubicacin del Engineer, mining.  Viene y se va, o persiste todo el tiempo?  Hay situaciones que Location manager (comer ciertos alimentos, la actividad fsica)?  Tiene otros sntomas asociados al dolor (fiebre, nuseas, vmitos, diarrea)? Todo es de gran ayuda cuando se trata de hallar la causa del dolor. CAUSAS  Estmago: Infecciones por virus o bacterias, o lcera.  Intestino: Apendicitis (apndice inflamado), ileitis regional (enfermedad de Crohn), colitis ulcerosa (colon inflamado), sndrome del colon irritable, diverticulitis (inflamacin de los divertculos del colon) o cncer de estmago oo intestino.  Enfermedades de la vescula biliar o clculos.  Enfermedades renales, clculos o infecciones en el rin.  Infeccin o cncer del pncreas.  Fibromialgia (trastorno doloroso)  Enfermedades de los rganos femeninos:  Uterus: tero: fibroma (tumor no canceroso) o infeccin  Trompas de Falopio: infeccin o embarazo ectpico  En los ovarios, quistes o tumores.  Adherencias plvicas (tejido cicatrizal).  Endometriosis (el tejido que cubre el tero se desarrolla en la pelvis y los rganos plvicos).  Sndrome de Agricultural engineer (los rganos femeninos se llenan de sangre antes del periodo menstrual(  Dolor durante el periodo menstrual.  Dolor durante la ovulacin (al producir vulos).  Dolor al usar el DIU (dispositivo intrauterino para el control de la natalidad)  Psychologist, clinical los  rganos femeninos.  Dolor funcional (no est originado en una enfermedad, puede mejorar sin tratamiento).  Dolor de origen psicolgico  Depresin. DIAGNSTICO Su mdico decidir la gravedad del dolor a travs del examen fsico  Anlisis de sangre  Radiografas  Ecografas  TC (tomografa computada, tipo especial de radiografas).  IMR (resonancia magntica)  Cultivos, en el caso una infeccin  Colon por enema de bario (se inserta una sustancia de contraste en el intestino grueso para mejorar la observacin con rayos X.)  Colonoscopa (observacin del intestino con un tubo luminoso).  Laparoscopa (examen del interior del abdomen con un tubo que tiene [employer]).  Ciruga exploratoria abdominal mayor (se observa el abdomen realizando una gran incisin). TRATAMIENTO El tratamiento depender de la causa del problema.   Muchos de estos casos pueden controlarse y tratarse en casa.  Medicamentos de venta libre indicados por el mdico.  Medicamentos con receta.  Antibiticos, en caso de infeccin  Pldoras anticonceptivas, en el caso de perodos dolorosos o dolor al ovular.  Tratamiento hormonal, para la endometriosis  Inyecciones para bloqueo nervioso selectivo.  Fisioterapia.  Antidepresivos.  Consejos por parte de un psclogo o psiquiatra.  Ciruga mayor o menor. INSTRUCCIONES PARA EL CUIDADO DOMICILIARIO  No tome ni administre laxantes a menos que se lo haya indicado su mdico.  Tome analgsicos de venta libre slo si se lo ha indicado el profesional que lo asiste. No tome aspirina, ya que puede causar [employer] o hemorragias.  Consuma una dieta lquida (caldo o agua) segn lo indicado por el mdico. Progrese lentamente a una dieta blanda, segn la tolerancia, si el dolor se relaciona con el estmago o el intestino.  Tenga un termmetro y tmese la temperatura varias veces al da.  Haga reposo en la cama y [place], si esto Management consultant.  Evite las relaciones sexuales, Counsellor.  Evite las situaciones estresantes.  Cumpla con las visitas y los anlisis de control, segn las indicaciones de su mdico.  Si el dolor no se [place] con los medicamentos o la [place], [place] tratar con:  Acupuntura.  Ejercicios de relajacin (yoga, meditacin).  Terapia grupal.  Psicoterapia. SOLICITE ATENCIN MDICA SI:  Nota que ciertos Pharmacist, community de [place].  El tratamiento indicado para Arboriculturist no Marketing executive.  Necesita analgsicos ms fuertes.  Quiere que le retiren el DIU.  Si se siente confundido o desfalleciente.  Presenta nuseas o vmitos.  Aparece una erupcin cutnea.  Sufre efectos adversos o una reaccin alrgica debido a los medicamentos que toma. SOLICITE ATENCIN MDICA DE INMEDIATO SI:  El dolor persiste o se agrava.  Tiene fiebre.  Siente el dolor slo en algunos sectores del abdomen. Si se localiza en la zona derecha, posiblemente podra tratarse de apendicitis. En un adulto, si se localiza en la regin inferior izquierda del abdomen, podra tratarse de colitis o diverticulitis.  Hay sangre en las heces (deposiciones de color rojo brillante o negro alquitranado), con o sin vmitos.  Usted presenta sangre en la orina.  Siente escalofros con o sin fiebre.  Se desmaya. ASEGRESE QUE:   Comprende estas instrucciones.  Controlar su enfermedad.  Solicitar ayuda de inmediato si no mejora o si empeora. Document Released: 09/10/2008 Document Revised: 08/17/2011 [facility] Patient Information 2013 [place], [place]. RESOURCE GUIDE  Chronic Pain Problems: Contact [facility]  [phone] Patients need to be referred by their primary care doctor.  Insufficient Money for Medicine: Contact United Way:  call "211."   No Primary Care Doctor: - Call Health Connect  [phone] - can help you locate a primary care doctor that  accepts  your insurance, provides certain services, etc. - Physician Referral Service- [phone]  Agencies that provide inexpensive medical care: - [facility]  [phone] - [facility]  [phone] - [facility]  [phone] - Women's Clinic  [phone] - Planned Parenthood  [phone] [facility]  [phone]  Medicaid-accepting [facility] Providers: - [facility]- [address]  [phone], Mon-Fri 9am-7pm, Sat 9am-1pm - [facility]- [address] [place], Suite [place]  [phone] - [facility]- [address] [place]  [phone] [facility]- [address]  [phone] - Renaye Rakers- [address] [place], [address], [phone]  Only accepts [place] patients after they have their name  applied to their card  Self Pay (no insurance) in [place]: - Sickle Cell Patients: Dr Willey Blade, [facility] Internal Medicine  [address] [place], [phone] - [facility]gent [facility]- [address] [place]  [phone]       [facility] [place]- [address]       -     [facility]- see information above (Speak to [employer] if you do not have insurance)       -  [facility]- [address],  [phone]       -  [facility]- [address] [phone]       -  Dr Julio Sicks-  [address], [place], [phone]       -  [facility] [place] - [address] [phone]       -  [facility]- [address], [phone], also [address], [phone]       -    [facility]- [address] [place], [phone], 1st & 3rd Saturday        every month, 10am-1pm  [facility] [address] [place], [place] [address] [phone]  [facility] [address] [place], [place] [address] [phone]  1) Find a Doctor and Pay Out of Pocket Although you won't have to find out who is covered by your insurance plan, it is a good idea to ask around and get recommendations. You will then need to call the office and see if the doctor you have chosen will accept you as a new patient and what types of options they offer for patients who are self-pay. Some doctors offer discounts or will set up payment plans for their patients who do not have insurance, but you will need to ask so you aren't surprised when you get to your appointment.  2) Contact Your Local Health Department Not all health departments have doctors that can see patients for sick visits, but many do, so it is worth a call to see if yours does. If you don't know where your local health department is, you can check in your phone book. The CDC also has a tool to help you locate your state's health department, and many state websites also have listings of all of their local health departments.  3) Find a Walk-in Clinic If your illness is not likely to be very severe or complicated, you may want to try a walk in clinic. These are popping up all over the country in pharmacies, drugstores, and shopping centers. They're usually staffed by nurse practitioners or physician assistants that have been trained to treat common illnesses and complaints. They're usually fairly quick and inexpensive. However, if you have serious medical issues or chronic medical problems, these are probably not your best option  STD Testing - [facility] Department of [facility], STD Clinic, [address] [place], phone [phone] or [phone].  Monday - Friday, call for an appointment. [facility] Department of [employer], STD Clinic, [address], [place], phone [phone] or [phone].  Monday - Friday, call for an appointment.  Abuse/Neglect: [facility] Child Abuse Hotline ([phone] [facility] Child Abuse Hotline [phone] (After Hours)  Emergency Shelter:  [facility] [phone]  Maternity Homes: - Room at the [place] [facility] [phone] - [facility] Alert [facility] [phone]  MRSA Hotline #:   (562) 246-4016  Dental Assistance If unable to pay or uninsured, contact:  [facility]. to become qualified for the adult dental clinic.  Patients with Medicaid: [facility] [phone] [address] Qu[address], [phone] [address] [phone]  If unable to pay, or uninsured, contact [facility] [phone] in [place], [phone] in [facility]) to become qualified for the adult dental clinic  [facility] [address] [place], [place] [address] [phone] [email or web address]  Other Proofreader Services: - [facility]- [address] [place], [place], [address], [phone], Ext. 123, 2nd and 4th Thursday  of the month at 6:30am.  10 clients each day by appointment, can sometimes see walk-in patients if someone does not show for an appointment. [facility]- [address] [place], [place], [address], [phone] - [facility] [address] [place], [place], [address], [phone] - [facility]- [phone] [facility] Health Department- [phone] [facility] Health [phone]       Behavioral Health Resources in the [facility]  Intensive Outpatient Programs: [facility]      [address] [place], [place] [phone] Both a day and evening program       [facility] Outpatient     [address]        [place], [place] [address] [phone]         ADS: [facility] [address] [place] [phone]  [facility] Mental Health ACCESS LINE: [phone] or [phone] [address] [place], [place] [address] [email or web address]   Substance Abuse Resources: - [facility] Drug Services  [phone] - [facility] [phone] - The [place] [phone] Floydene Flock [phone] - Residential & Outpatient Substance Abuse Program  [phone]  Psychological Services: [facility]  [phone] [facility] Services  [phone] - [facility], [phone] [place]. [address], [place], ACCESS LINE: ([phone] or [phone], [email or web address]  Mobile Crisis Teams:                                        Therapeutic Alternatives         Mobile Crisis Care Unit [phone]             [facility] [address] Ginette Otto [phone]                                         Interventionist [address] [place] [phone]  Self-Help/Support Groups: Mental Health Assoc. of [employer] of support groups 917-141-0234 (call for more info)   Narcotics Anonymous (NA) [facility]aring [facility] [address] [place] - 2 meetings at this location  Residential Treatment Programs:  [facility]      [address]        [place]       [phone]         [facility] [address] [place] [address] [place], [place]  [address] [phone]  [facility]  [address] [place], [place] [address] [phone] Admissions: 8am-3pm M-F  [facility]     [address]        [place], [place] [address]       [phone]         [facility] [address] [place], [place] [phone]  [facility] [address] [place], [place] [phone]  [facility] - Intensive Outpatient      [address]     [place], [place]       [phone]         [facility] (Addiction Recovery  Care Assoc.)     [address] [place], [place] [phone] or [phone]  Residential Treatment Services (RTS),  Medicaid [address] [place], [place] [phone]  Fellowship [address]                                               [address] [place] [phone]  [facility] Resources: [phone]               General Therapy                                                Angie Fava, PhD        [address] [place], [place] [address]         [phone]   Insurance  [facility] Behavioral   [address] [place], [place] [address] [phone]  [facility] Recovery [address] [place], [place] [address] [phone] Insurance/Medicaid/sponsorship through [facility] and Families                                              [address]                                        [place], [place] [address]    Therapy/tele-psych/case         [phone]          [facility] [address], [place]  [address]  Adolescent/group home/case management [phone]                                           Creola Corn PhD       General therapy       Insurance   [phone]         Dr. Lolly Mustache, [place], M-F [phone]  [facility] [place]  United Way [facility] Dept. [address]                 [address]         [address] [place] [address]  [place]                                               Cristobal Goldmann Phone:  [phone]  Phone:  [phone]                   Phone:  [phone]  [facility], [phone] - [facility] - [phone]       -     [facility] in [place], [address]             [phone], Insurance  [place] Child Abuse Hotline 9157930195 or [phone] (After Hours)

## 2012-09-12 NOTE — ED Provider Notes (Signed)
History     CSN: [id number]  Arrival date & time 09/12/12  1353   First MD Initiated Contact with Patient 09/12/12 1410      Chief Complaint  Patient presents with  . Abdominal Pain    (Consider location/radiation/quality/duration/timing/severity/associated sxs/prior treatment) HPI Comments: 37 year old female with a history of cholecystectomy, 2 cesarean sections, and tubal ligation who presents for right lower quadrant pain that has been worsening since onset on Friday. Patient states that her pain is sharp and nonradiating made worse with movement and better with rest and after applying an icepack to her RLQ. Patient is to associated nausea and vomiting, stating that she has had 3 episodes of emesis since yesterday all of which have been nonbloody and nonbilious. She also states she went to the pharmacy on Saturday where they took her temperature; patient endorses a fever of 104. Afebrile in triage. Patient states her last bowel movement was yesterday which was normal in color and consistency. Patient has been seen for similar complaint in February 2014 as well as a year prior. Patient states her symptoms are identical in location, quality, and severity. CT scans from every 2014 as well as July 2013 negative for evidence of acute process as well as appendicitis. Patient denies lightheadedness, dizziness, chest pain, shortness of breath, diarrhea, melena, hematochezia, urinary symptoms, vaginal discharge, numbness or tingling in her extremities.  Patient is a 37 y.o. female presenting with abdominal pain. The history is provided by the patient. No language interpreter was used.  Abdominal Pain Associated symptoms: fever, nausea and vomiting   Associated symptoms: no chest pain, no diarrhea, no dysuria, no hematuria, no shortness of breath, no vaginal bleeding and no vaginal discharge     Past Medical History  Diagnosis Date  . History of bronchitis     "I've stayed in hospital 2 X  w/bronchitis"  . Asthma   . History of blood transfusion 1999    w/vaginal delivery    Past Surgical History  Procedure Laterality Date  . Cholecystectomy  2011  . Cesarean section  1997; 2008  . Tubal ligation  04/2007    History reviewed. No pertinent family history.  History  Substance Use Topics  . Smoking status: Never Smoker   . Smokeless tobacco: Never Used  . Alcohol Use: No    OB History   Grav Para Term Preterm Abortions TAB SAB Ect Mult Living                  Review of Systems  Constitutional: Positive for fever.  HENT: Negative for neck pain and neck stiffness.   Respiratory: Negative for shortness of breath.   Cardiovascular: Negative for chest pain.  Gastrointestinal: Positive for nausea, vomiting and abdominal pain. Negative for diarrhea and blood in stool.  Genitourinary: Negative for dysuria, hematuria, vaginal bleeding and vaginal discharge.  Musculoskeletal: Negative for back pain.  Neurological: Negative for dizziness, syncope, light-headedness and numbness.  All other systems reviewed and are negative.    Allergies  Review of patient's allergies indicates no known allergies.  Home Medications   Current Outpatient Rx  Name  Route  Sig  Dispense  Refill  . albuterol (PROVENTIL,VENTOLIN) 90 MCG/ACT inhaler   Inhalation   Inhale 2 puffs into the lungs every 4 (four) hours as needed. For wheeze or shortness of breath         . ibuprofen (ADVIL,MOTRIN) 200 MG tablet   Oral   Take 400 mg by mouth every 6 (six)  hours as needed for pain.          . promethazine (PHENERGAN) 25 MG tablet   Oral   Take 1 tablet (25 mg total) by mouth every 6 (six) hours as needed for nausea.   30 tablet   0   . ondansetron (ZOFRAN) 4 MG tablet   Oral   Take 1 tablet (4 mg total) by mouth every 6 (six) hours as needed for nausea.   12 tablet   0   . oxyCODONE-acetaminophen (PERCOCET/ROXICET) 5-325 MG per tablet   Oral   Take 1-2 tablets by mouth every  4 (four) hours as needed for pain.   15 tablet   0     BP 123/70  Pulse 98  Temp(Src) 98.3 F (36.8 C) (Oral)  Resp 20  SpO2 97%  LMP 09/07/2012  Physical Exam  Nursing note and vitals reviewed. Constitutional: She is oriented to person, place, and time. She appears well-developed and well-nourished. No distress.  Patient sitting in bed, well-appearing and in no acute distress.  HENT:  Head: Normocephalic and atraumatic.  Eyes: Conjunctivae are normal. Pupils are equal, round, and reactive to light. No scleral icterus.  Neck: Normal range of motion. Neck supple.  Cardiovascular: Normal rate, regular rhythm, normal heart sounds and intact distal pulses.   Pulmonary/Chest: Effort normal and breath sounds normal. No respiratory distress. She has no wheezes. She has no rales.  Abdominal: Soft. Bowel sounds are normal. She exhibits no mass. There is tenderness. There is no rebound and no guarding.  Focal tenderness appreciated on palpation of the RLQ and right suprapubic region. No peritoneal signs.  Genitourinary: Uterus normal. There is no rash, tenderness, lesion or injury on the right labia. There is no rash, tenderness, lesion or injury on the left labia. Uterus is not enlarged and not tender. Cervix exhibits discharge. Cervix exhibits no motion tenderness and no friability. Right adnexum displays no mass, no tenderness and no fullness. Left adnexum displays no mass, no tenderness and no fullness. No erythema, tenderness or bleeding around the vagina. No foreign body around the vagina. No signs of injury around the vagina. Vaginal discharge found.  Clear discharge from cervix appreciated; mild amount of milky/clear d/c in vaginal vault. No CMT or adnexal tenderness on exam.  Musculoskeletal: Normal range of motion.  Lymphadenopathy:    She has no cervical adenopathy.  Neurological: She is alert and oriented to person, place, and time.  Skin: Skin is warm and dry. No rash noted. She is  not diaphoretic. No erythema.  Psychiatric: She has a normal mood and affect. Her behavior is normal.    ED Course  Procedures (including critical care time)  Labs Reviewed  WET PREP, GENITAL - Abnormal; Notable for the following:    WBC, Wet Prep HPF POC TOO NUMEROUS TO COUNT (*)    All other components within normal limits  COMPREHENSIVE METABOLIC PANEL - Abnormal; Notable for the following:    Glucose, Bld 123 (*)    Creatinine, Ser 0.43 (*)    All other components within normal limits  CBC WITH DIFFERENTIAL - Abnormal; Notable for the following:    WBC 14.8 (*)    Neutro Abs 11.2 (*)    All other components within normal limits  URINALYSIS, MICROSCOPIC ONLY - Abnormal; Notable for the following:    APPearance CLOUDY (*)    Bacteria, UA MANY (*)    Squamous Epithelial / LPF MANY (*)    All other components within  normal limits  GC/CHLAMYDIA PROBE AMP  LIPASE, BLOOD  POCT PREGNANCY, URINE   US Transvaginal Non-ob  09/12/2012  *RADIOLOGY REPORT*  Clinical Data:  Right lower quadrant abdominal pain.  The patient has had two C sections.  TRANSABDOMINAL AND TRANSVAGINAL ULTRASOUND OF PELVIS DOPPLER ULTRASOUND OF OVARIES  Technique:  Both transabdominal and transvaginal ultrasound examinations of the pelvis were performed. Transabdominal technique was performed for global imaging of the pelvis including uterus, ovaries, adnexal regions, and pelvic cul-de-sac.  It was necessary to proceed with endovaginal exam following the transabdominal exam to visualize the ovaries.  Color and duplex Doppler ultrasound was utilized to evaluate blood flow to the ovaries.  Comparison:  CT abdomen pelvis from the same day.  Findings:  Uterus:  Uterus is diffusely heterogeneous.  No discrete to fibroids are evident.  Measures 8.8 x 4.8 x 5.2 cm.  Endometrium:  Normal thickness and appearance, measuring 6.3 mm, within normal limits.  Right ovary: Normal size and echotexture.  The right ovary measures 3.4 x 2.7  x 2.8 cm.  Left ovary:   Normal size and echotexture.  The left ovary measures 2.8 x 2.0 x 2.2 cm.  Pulsed Doppler evaluation demonstrates normal low-resistance arterial and venous waveforms in both ovaries.  IMPRESSION:  1.  The uterus is somewhat heterogeneous which may represent edema or small fibroids.  No discrete lesions are evident. 2.  The ovaries are within normal limits.  No sonographic evidence for ovarian torsion.   Original Report Authenticated By: Marin Roberts, M.D.    US Pelvis Complete  09/12/2012  *RADIOLOGY REPORT*  Clinical Data:  Right lower quadrant abdominal pain.  The patient has had two C sections.  TRANSABDOMINAL AND TRANSVAGINAL ULTRASOUND OF PELVIS DOPPLER ULTRASOUND OF OVARIES  Technique:  Both transabdominal and transvaginal ultrasound examinations of the pelvis were performed. Transabdominal technique was performed for global imaging of the pelvis including uterus, ovaries, adnexal regions, and pelvic cul-de-sac.  It was necessary to proceed with endovaginal exam following the transabdominal exam to visualize the ovaries.  Color and duplex Doppler ultrasound was utilized to evaluate blood flow to the ovaries.  Comparison:  CT abdomen pelvis from the same day.  Findings:  Uterus:  Uterus is diffusely heterogeneous.  No discrete to fibroids are evident.  Measures 8.8 x 4.8 x 5.2 cm.  Endometrium:  Normal thickness and appearance, measuring 6.3 mm, within normal limits.  Right ovary: Normal size and echotexture.  The right ovary measures 3.4 x 2.7 x 2.8 cm.  Left ovary:   Normal size and echotexture.  The left ovary measures 2.8 x 2.0 x 2.2 cm.  Pulsed Doppler evaluation demonstrates normal low-resistance arterial and venous waveforms in both ovaries.  IMPRESSION:  1.  The uterus is somewhat heterogeneous which may represent edema or small fibroids.  No discrete lesions are evident. 2.  The ovaries are within normal limits.  No sonographic evidence for ovarian torsion.   Original  Report Authenticated By: Marin Roberts, M.D.    Ct Abdomen Pelvis W Contrast  09/12/2012  *RADIOLOGY REPORT*  Clinical Data: Right lower quadrant abdominal pain, nausea and vomiting.  CT ABDOMEN AND PELVIS WITH CONTRAST  Technique:  Multidetector CT imaging of the abdomen and pelvis was performed following the standard protocol during bolus administration of intravenous contrast.  Contrast: OMNIPAQUE IOHEXOL 300 MG/ML  SOLN  Comparison: 07/31/2012  Findings: The appendix is difficult to discretely visualize.  There clearly is no evidence by CT of acute appendicitis.  The  liver, pancreas, spleen, adrenal glands and kidneys are within normal limits.  The gallbladder has been removed.  No biliary ductal dilatation is identified.  No free fluid or abscess is seen.  No masses or enlarged lymph nodes.  No hernias are identified.  The bladder, uterus and adnexal regions are unremarkable by CT.  Clips are seen related to prior tubal ligation.  Bony structures are unremarkable.  IMPRESSION: No acute abnormalities are identified.  No evidence of acute appendicitis by CT.   Original Report Authenticated By: Irish Lack, M.D.    [place] Art/ven Flow Abd Pelv Doppler  09/12/2012  *RADIOLOGY REPORT*  Clinical Data:  Right lower quadrant abdominal pain.  The patient has had two C sections.  TRANSABDOMINAL AND TRANSVAGINAL ULTRASOUND OF PELVIS DOPPLER ULTRASOUND OF OVARIES  Technique:  Both transabdominal and transvaginal ultrasound examinations of the pelvis were performed. Transabdominal technique was performed for global imaging of the pelvis including uterus, ovaries, adnexal regions, and pelvic cul-de-sac.  It was necessary to proceed with endovaginal exam following the transabdominal exam to visualize the ovaries.  Color and duplex Doppler ultrasound was utilized to evaluate blood flow to the ovaries.  Comparison:  CT abdomen pelvis from the same day.  Findings:  Uterus:  Uterus is diffusely heterogeneous.  No  discrete to fibroids are evident.  Measures 8.8 x 4.8 x 5.2 cm.  Endometrium:  Normal thickness and appearance, measuring 6.3 mm, within normal limits.  Right ovary: Normal size and echotexture.  The right ovary measures 3.4 x 2.7 x 2.8 cm.  Left ovary:   Normal size and echotexture.  The left ovary measures 2.8 x 2.0 x 2.2 cm.  Pulsed Doppler evaluation demonstrates normal low-resistance arterial and venous waveforms in both ovaries.  IMPRESSION:  1.  The uterus is somewhat heterogeneous which may represent edema or small fibroids.  No discrete lesions are evident. 2.  The ovaries are within normal limits.  No sonographic evidence for ovarian torsion.   Original Report Authenticated By: Marin Roberts, M.D.      1. RLQ abdominal pain      MDM  Patient is a 37 year old female with a history of cholecystectomy, C-section, tubal ligation who presents for sharp nonradiating right lower quadrant pain, worsening x4 days. Patient has been worked up for it this same pain in the past; hospitalized in July 2013 for right lower quadrant pain without a source. Patient's 2 prior CT scans of the abdomen with contrast have been negative for acute findings and appendicitis. Patient exhibits no focal tenderness on deep palpation of the right lower quadrant. Workup today to include basic labs, lipase, urinalysis and urine pregnancy as well as a pelvic exam with wet prep and GC/Chlamydia cultures.   Patient states pain is moderately controlled with IV morphine and Dilaudid. Patient not complaining of nausea at this time. Given leukocytosis of 14.8 CT abdomen/pelvis ordered. Patient tolerating oral contrast without N/V.  CT without evidence of acute findings to explain patient's pain; appendix difficult to discretely visualize, but no evidence of appendicitis on CT scan. Patient has been worked up for this pain numerous times in the past without pelvic U/S. Will order complete pelvic U/S for further evaluation,  though no abnormalities were appreciated on manual exam of adnexa.  Pelvic U/S with evidence of mildly heterogeneous uterus - edema vs small fibroids. No TOA, ovarian cysts, ruptured cyst, or ovarian torsion. Have discussed these results with the patient. Patient's pain has remained well controlled; she is well and nontoxic  appearing as well as hemodynamically stable, tolerating PO fluids. Patient will be d/c with GI, PCP, and OBGYN follow up for further evaluation of symptoms. Patient given percocet to take as needed for pain and zofran for nausea/vomiting. Indications for ED return discussed. Patient states comfort and understanding with this d/c plan with no unaddressed concerns. Patient seen also by Dr. Manus Gunning who is in agreement with this work up and management plan.   Filed Vitals:   09/12/12 1800 09/12/12 1815 09/12/12 1845 09/12/12 1900  BP: 117/63 118/56 115/68 123/70  Pulse: 90 87 90 98  Temp:      TempSrc:      Resp:      SpO2: 98% 96% 98% 97%        Antony Madura, PA-C 09/13/12 1354

## 2012-09-13 LAB — GC/CHLAMYDIA PROBE AMP: GC Probe RNA: NEGATIVE

## 2012-09-14 NOTE — ED Provider Notes (Signed)
Medical screening examination/treatment/procedure(s) were conducted as a shared visit with non-physician practitioner(s) and myself.  I personally evaluated the patient during the encounter  Acute on chronic RLQ pain with vomiting.  Appears well, talking on phone. TTP RLQ.  Glynn Octave, MD 09/14/12 1016

## 2012-12-27 ENCOUNTER — Ambulatory Visit: Payer: Self-pay

## 2012-12-28 ENCOUNTER — Ambulatory Visit: Payer: Medicaid Other

## 2013-01-03 ENCOUNTER — Encounter: Payer: Self-pay | Admitting: Family Medicine

## 2013-01-03 ENCOUNTER — Other Ambulatory Visit (HOSPITAL_COMMUNITY)
Admission: RE | Admit: 2013-01-03 | Discharge: 2013-01-03 | Disposition: A | Discharge status: Home / Self Care | Payer: No Typology Code available for payment source | Source: Ambulatory Visit | Attending: Family Medicine | Admitting: Family Medicine

## 2013-01-03 ENCOUNTER — Ambulatory Visit: Payer: Self-pay | Attending: Family Medicine

## 2013-01-03 ENCOUNTER — Ambulatory Visit: Payer: No Typology Code available for payment source | Attending: Family Medicine | Admitting: Family Medicine

## 2013-01-03 VITALS — BP 117/78 | HR 88 | Temp 98.6°F | Ht 61.5 in | Wt 211.0 lb

## 2013-01-03 DIAGNOSIS — Z1151 Encounter for screening for human papillomavirus (HPV): Secondary | ICD-10-CM | POA: Insufficient documentation

## 2013-01-03 DIAGNOSIS — Z01419 Encounter for gynecological examination (general) (routine) without abnormal findings: Secondary | ICD-10-CM | POA: Insufficient documentation

## 2013-01-03 DIAGNOSIS — N760 Acute vaginitis: Secondary | ICD-10-CM | POA: Insufficient documentation

## 2013-01-03 DIAGNOSIS — K219 Gastro-esophageal reflux disease without esophagitis: Secondary | ICD-10-CM

## 2013-01-03 DIAGNOSIS — Z113 Encounter for screening for infections with a predominantly sexual mode of transmission: Secondary | ICD-10-CM | POA: Insufficient documentation

## 2013-01-03 DIAGNOSIS — J45909 Unspecified asthma, uncomplicated: Secondary | ICD-10-CM

## 2013-01-03 DIAGNOSIS — F329 Major depressive disorder, single episode, unspecified: Secondary | ICD-10-CM

## 2013-01-03 DIAGNOSIS — Z124 Encounter for screening for malignant neoplasm of cervix: Secondary | ICD-10-CM

## 2013-01-03 DIAGNOSIS — E669 Obesity, unspecified: Secondary | ICD-10-CM

## 2013-01-03 LAB — CBC
HCT: 38.6 % (ref 36.0–46.0)
MCHC: 32.6 g/dL (ref 30.0–36.0)
MCV: 83.2 fL (ref 78.0–100.0)
RDW: 14.9 % (ref 11.5–15.5)

## 2013-01-03 NOTE — Progress Notes (Signed)
Patient ID: Alexandra Norris, female   DOB: 06/28/1975, 37 y.o.   MRN: [id number]  CC:  Establish Care   HPI: Pt reports that she has been well.  She has never  Had an abnormal pap smear before.  She has asthma that his controlled and rarely uses albuterol inhaler.  She reports that she has GERD but has been stable.  No chest pain and no SOB.    No Known Allergies Past Medical History  Diagnosis Date  . History of bronchitis     "I've stayed in hospital 2 X w/bronchitis"  . Asthma   . History of blood transfusion 1999    w/vaginal delivery   Current Outpatient Prescriptions on File Prior to Visit  Medication Sig Dispense Refill  . albuterol (PROVENTIL,VENTOLIN) 90 MCG/ACT inhaler Inhale 2 puffs into the lungs every 4 (four) hours as needed. For wheeze or shortness of breath      . ibuprofen (ADVIL,MOTRIN) 200 MG tablet Take 400 mg by mouth every 6 (six) hours as needed for pain.       Marland Kitchen ondansetron (ZOFRAN) 4 MG tablet Take 1 tablet (4 mg total) by mouth every 6 (six) hours as needed for nausea.  12 tablet  0  . oxyCODONE-acetaminophen (PERCOCET/ROXICET) 5-325 MG per tablet Take 1-2 tablets by mouth every 4 (four) hours as needed for pain.  15 tablet  0  . promethazine (PHENERGAN) 25 MG tablet Take 1 tablet (25 mg total) by mouth every 6 (six) hours as needed for nausea.  30 tablet  0   No current facility-administered medications on file prior to visit.   Family History  Problem Relation Age of Onset  . Diabetes Mother   . Osteoarthritis Mother   . Hypertension Mother    History   Social History  . Marital Status: Married    Spouse Name: N/A    Number of Children: N/A  . Years of Education: N/A   Occupational History  . Not on file.   Social History Main Topics  . Smoking status: Never Smoker   . Smokeless tobacco: Never Used  . Alcohol Use: No  . Drug Use: No  . Sexually Active: Yes   Other Topics Concern  . Not on file   Social History Narrative  . No  narrative on file    Review of Systems  Constitutional: Negative for fever, chills, diaphoresis, activity change, appetite change and fatigue.  HENT: Negative for ear pain, nosebleeds, congestion, facial swelling, rhinorrhea, neck pain, neck stiffness and ear discharge.   Eyes: Negative for pain, discharge, redness, itching and visual disturbance.  Respiratory: Negative for cough, choking, chest tightness, shortness of breath, wheezing and stridor.   Cardiovascular: Negative for chest pain, palpitations and leg swelling.  Gastrointestinal: Negative for abdominal distention.  Genitourinary: Negative for dysuria, urgency, frequency, hematuria, flank pain, decreased urine volume, difficulty urinating and dyspareunia.  Musculoskeletal: Negative for back pain, joint swelling, arthralgias and gait problem.  Neurological: Negative for dizziness, tremors, seizures, syncope, facial asymmetry, speech difficulty, weakness, light-headedness, numbness and headaches.  Hematological: Negative for adenopathy. Does not bruise/bleed easily.  Psychiatric/Behavioral: Negative for hallucinations, behavioral problems, confusion, dysphoric mood, decreased concentration and agitation.    Objective:   Filed Vitals:   01/03/13 1549  BP: 117/78  Pulse: 88  Temp: 98.6 F (37 C)    Physical Exam  Constitutional: Appears well-developed and well-nourished. No distress.  HENT: Normocephalic. External right and left ear normal. Oropharynx is clear and moist.  Eyes:  Conjunctivae and EOM are normal. PERRLA, no scleral icterus.  Neck: Normal ROM. Neck supple. No JVD. No tracheal deviation. No thyromegaly.  CVS: RRR, S1/S2 +, no murmurs, no gallops, no carotid bruit.  Pulmonary: Effort and breath sounds normal, no stridor, rhonchi, wheezes, rales.  Abdominal: Soft. BS +,  no distension, tenderness, rebound or guarding.  Musculoskeletal: Normal range of motion. No edema and no tenderness.  Lymphadenopathy: No  lymphadenopathy noted, cervical, inguinal. Neuro: Alert. Normal reflexes, muscle tone coordination. No cranial nerve deficit. Skin: Skin is warm and dry. No rash noted. Not diaphoretic. No erythema. No pallor.  Psychiatric: Normal mood and affect. Behavior, judgment, thought content normal.   Lab Results  Component Value Date   WBC 14.8* 09/12/2012   HGB 13.6 09/12/2012   HCT 39.4 09/12/2012   MCV 80.7 09/12/2012   PLT 267 09/12/2012   Lab Results  Component Value Date   CREATININE 0.43* 09/12/2012   BUN 13 09/12/2012   NA 139 09/12/2012   K 4.0 09/12/2012   CL 105 09/12/2012   CO2 23 09/12/2012    No results found for this basename: HGBA1C   Lipid Panel     Component Value Date/Time   CHOL 144 02/26/2010 2025   TRIG 64 02/26/2010 2025   HDL 46 02/26/2010 2025   CHOLHDL 3.1 Ratio 02/26/2010 2025   VLDL 13 02/26/2010 2025   LDLCALC 85 02/26/2010 2025     Assessment and plan:   Patient Active Problem List   Diagnosis Date Noted  . Screening for malignant neoplasm of the cervix 01/03/2013  . GASTROESOPHAGEAL REFLUX DISEASE 08/14/2009  . ETHMOIDAL SINUSITIS, ACUTE 05/14/2009  . DEPRESSION, SITUATIONAL, PROLONGED 02/05/2009  . UNSPECIFIED VISUAL LOSS 11/27/2008  . CHALAZION, LEFT 11/27/2008  . OBESITY 05/30/2007  . IRREGULAR MENSTRUAL CYCLE 05/30/2007  . ASTHMA, PERSISTENT 12/06/2006   Vision Screen 20/20 Pap pelvic exam today Check labs today  Follow up lab results  Check vision screen  RTC in 6 months  The patient was given clear instructions to go to ER or return to medical center if symptoms don't improve, worsen or new problems develop.  The patient verbalized understanding.  The patient was told to call to get any lab results if not heard anything in the next week.    Rodney Langton, MD, CDE, FAAFP [facility] [place], [place]

## 2013-01-03 NOTE — Patient Instructions (Addendum)
Asma - Adultos  (Asthma, Adult)  El asma es una enfermedad que afecta los pulmones. Se caracteriza por la inflamacin y Public librarian de las vas respiratorias, as como aumento de la produccin mucosa. La causa del estrechamiento es el edema y los espasmos musculares que se producen dentro de los conductos por los que pasa el aire. Cuando esto sucede, la respiracin puede ser difcil y [place] tos, sibilancias y falta de Occupational hygienist. Aprender ms sobre su enfermedad le ayudar a Art therapist. El asma no puede curarse, pero los [employer] y los cambios en el estilo de vida lo ayudarn a Therapist, sports. Puede ser un problema menor para [employer], pero si no se controla puede conducir a un ataque potencialmente mortal. El asma puede cambiar con el [place]. Es importante trabajar con su mdico para controlar sus sntomas. CAUSAS  La causa exacta es desconocida. Se cree que la causa son factores heredados (genticosy la exposicin a Secondary school teacher. En el asma hay irritacin e hinchazn (inflamacin) de las vas respiratorias. Puede originarse debido a Set designer, infecciones virales del pulmn o irritantes presentes en el aire. Las Chief of Staff pueden causar sibilancias inmediatamente o varias horas despus de una exposicin. Los desencadenantes del asma son diferentes para cada persona. Es Landscape architect atencin y saber qu lo desencadena.  Algunos desencadenantes comunes para los ataques de asma son:   La caspa que eliminan los animales de la piel, el pelo o las plumas de [place].  Los caros del polvo que se encuentran en el polvo de la casa.  [place].  El plen de los rboles o el csped.  El moho.  El humo del cigarrillo o del tabaco. NO DEBE PERMITIRSE QUE SE FUME EN LOS HOGARES DE PERSONAS CON ASMA. Las Acupuncturist no deben fumar y no deben Agricultural consultant de fumadores.  Sustancias contaminantes como el polvo, limpiadores hogareos, sprays para el cabello,  aerosoles, vapores de [place], sustancias qumicas fuertes u olores intensos.  El aire fro o los cambios climticos. El aire fro puede causar inflamacin. El viento aumenta la cantidad de moho y polen del aire. No hay ningn clima particular que sea el mejor para un asmtico.  Emociones fuertes, como llorar o reir intensamente.  Estrs.  Ciertos medicamentos como la aspirina o los betabloqueantes.  Los sulfitos que se encuentran en medicamentos y bebidas como frutas desecadas y el vino.  Enfermedades infecciosas o inflamatorias como la gripe, el resfro o una inflamacin de las membranas nasales (rinitis).  Reflujo gastroesofgico (ERGE). El ERGE es una enfermedad del estmago en el que los cidos vuelven a la garganta (esfago).  Ejercicios o actividad extenuante. Algunos medicamentos administrados antes del ejercicio permiten que la mayora de las personas puedan participar en los deportes. SNTOMAS   Falta de aire.  Opresin o dolor en el pecho.  Dificultad para dormir debido a la tos, sibilancias o falta de aire.  Un silbido o sibilancias con la espiracin.  Tos o sibilancias que empeoran cuando usted:  Tiene un virus (como un resfriado o gripe).  Sufre de Set designer.  Est expuesto a ciertos vapores o sustancias qumicas.  La prctica de ejercicios. Los signos de que su asma est empeorando son:   Signos y sntomas de asma ms frecuentes y molestos.  Aumenta la dificultad para respirar. Esto puede medirse con un medidor de flujo mximo, que es un dispositivo simple que se [place] para comprobar como estn funcionando los pulmones.  La necesidad cada vez ms frecuente de Chief Strategy Officer  medirse con un medidor de flujo máximo, que es un dispositivo simple que se usa para comprobar como están funcionando los pulmones.  · La necesidad cada vez más frecuente de utilizar un inhalador de alivio rápido.  DIAGNÓSTICO   El diagnóstico se realiza revisando su historia clínica a través de un examen físico y con algunos estudios. Algunos estudios de la función pulmonar pueden ayudar con el diagnóstico.   TRATAMIENTO   El asma no se cura. Sin embargo, en la mayoría de los  adultos puede controlarse con tratamiento. Además de evitar los desencadenantes, será necesario que use medicamentos. Hay 2 tipos de medicamento que se utilizan en el tratamiento para el asma: los medicamentos controladores (reducen la inflamación y los síntomas) y los medicamentos de alivio o de rescate ( alivian los síntomas durante los ataques agudos). Es posible que necesite medicamentos todos los días para controlar el asma. Los medicamentos controladores más eficaces para el asma son los corticoides inhalados (reducen la inflamación). Otros medicamentos de control a largo plazo incluyen:   · Antagonistas de los receptores de leucotrienos (bloquea una vía de inflamación).  · Beta2-agonistas de acción prolongada (relajan los músculos de las vías respiratorias durante al menos 12 horas) con un corticoides inhalado.  · El cromoglicato o nedocromil sódico (modifica la capacidad de ciertas células inflamatorias para liberar los productos químicos que causa inflamación).  · Los inmunomoduladores (alteran el sistema inmunológico para prevenir los síntomas del asma).  · La teofilina (relaja los músculos de las vías respiratorias).  Puede ser posible que necesite beta2-agonistas de corta acción para aliviar los síntomas del asma durante un ataque agudo.   Usted debe saber qué hacer durante un ataque agudo. Los inhalantes son eficaces cuando se utilizan adecuadamente. Lea las instrucciones para saber como usar los medicamentos correctamente, y hable con su médico si tiene dudas. Concurra a los controles regularmente para asegurarse que el asma está bien controlado. Si no está bien controlado, si ha sido hospitalizado por asma o si se necesitan múltiples medicamentos o dosis medias a altas de corticoides inhalados para controlarlo, pida que lo deriven a un especialista en asma.   INSTRUCCIONES PARA EL CUIDADO EN EL HOGAR   · Tome todos los medicamentos según le indicó su médico.  · Controle el ambiente hogareño en los  siguientes modos para prevenir los ataques de asma:  · Cambie el filtro de la calefacción y del aire acondicionado al menos una vez al mes.  · Coloque un filtro o estopa sobre la ventilación de la calefacción o del aire acondicionado.  · Limite el uso de hogares o estufas a leña.  · No fume. No permanezca en lugares en los que otras personas fuman.  · Elimine las plagas (cucarachas, ratones) y sus excrementos.  · Si observa moho en una planta, elimínela.  · Limpie los pisos y elimine el polvo una vez por semana. Utilice productos sin perfume. Utilice una aspiradora con filtros HEPA, siempre que le sea posible. Si la aspiradora o las tareas de limpieza son los desencadenantes de su asma, trate de encontrar a otra persona para hacer estas tareas.  · Los pisos de su casa deben ser de madera, baldosas o vinilo. Las alfombras pueden retener la caspa y el polvo.  · Use almohadas, mantas y cubre colchones antialérgicos.  · Lave las sábanas y mantas todas las semanas con agua caliente, y séquelas con calor.  · Use mantas de poliéster o algodón de tejido apretado.  · No use cobertores   ataque. Un plan de accin que lo ayude a minimizar o [employer] ataques sin necesidad de Radio broadcast assistant atencin mdica.  Mantenga la calma durante el ataque.  Cuente siempre con un plan para solicitar atencin mdica. Debe incluir la forma de contactar a su mdico y, en caso de un ataque grave, comunicarse con el servicio de Sports administrator de su localidad (911 en los [place]). SOLICITE ATENCIN MDICA SI:   Respira con dificultad an cuando toma la medicina para prevenir los ataques.  Elimina flema cada vez ms  espesa.  Su esputo cambia de un color claro o blanco a un color amarillo, verde, gris o sanguinolento.  Tiene trastornos ocasionados por la medicina que est tomando (como urticaria, picazn, hinchazn o dificultades respiratorias).  Necesita un medicamento aliviador ms de 2 o 3 veces por semana.  Su flujo mximo an est en 50-79% del Arts administrator personal despus de seguir el plan de accin durante 1 hora. SOLICITE ATENCIN MDICA DE INMEDIATO SI:   Le falta el aire, incluso en reposo.  Le falta el aire an cuando hace muy poca actividad fsica.  Tiene dificultad para comer, beber o hablar debido a los sntomas del asma.  Siente dolor en el pecho o el corazn palpita muy rpido.  Tiene los labios o las uas de tono [place].  Usted se siente mareado, dbil o se desmaya.  Tiene fiebre o sntomas que persisten durante ms de 2 o 3 das.  Tiene fiebre y los sntomas empeoran de manera sbita.  Usted parece empeorar y no responde al tratamiento durante un ataque de asma.  Su flujo mximo es Adult nurse del 50% del Arts administrator personal. ASEGRESE DE QUE:   Comprende estas instrucciones.  Controlar su enfermedad.  Solicitar ayuda de inmediato si no mejora o si empeora. Document Released: 05/25/2005 Document Revised: 05/11/2012 [facility] Patient Information 2014 [place], [place]. Heartburn Heartburn is a painful, burning sensation in the chest. It may feel worse in certain positions, such as lying down or bending over. It is caused by stomach acid backing up into the tube that carries food from the mouth down to the stomach (lower esophagus).  CAUSES   Large meals.  Certain foods and drinks.  Exercise.  Increased acid production.  Being overweight or obese.  Certain medicines. SYMPTOMS   Burning pain in the chest or lower throat.  Bitter taste in the mouth.  Coughing. DIAGNOSIS  If the usual treatments for heartburn do not improve your symptoms, then tests may be  done to see if there is another condition present. Possible tests may include:  X-rays.  Endoscopy. This is when a tube with a light and a camera on the end is used to examine the esophagus and the stomach.  A test to measure the amount of acid in the esophagus (pH test).  A test to see if the esophagus is working properly (esophageal manometry).  Blood, breath, or stool tests to check for bacteria that cause ulcers. TREATMENT   Your caregiver may tell you to use certain over-the-counter medicines (antacids, acid reducers) for mild heartburn.  Your caregiver may prescribe medicines to decrease the acid in your stomach or protect your stomach lining.  Your caregiver may recommend certain diet changes.  For severe cases, your caregiver may recommend that the head of your bed be elevated on blocks. (Sleeping with more pillows is not an effective treatment as it only changes the position of your head and does not improve the main problem of stomach acid  refluxing into the esophagus.) HOME CARE INSTRUCTIONS   Take all medicines as directed by your caregiver.  Raise the head of your bed by putting blocks under the legs if instructed to by your caregiver.  Do not exercise right after eating.  Avoid eating 2 or 3 hours before bed. Do not lie down right after eating.  Eat small meals throughout the day instead of 3 large meals.  Stop smoking if you smoke.  Maintain a healthy weight.  Identify foods and beverages that make your symptoms worse and avoid them. Foods you may want to avoid include:  Peppers.  Chocolate.  High-fat foods, including fried foods.  Spicy foods.  Garlic and onions.  Citrus fruits, including oranges, grapefruit, lemons, and limes.  Food containing tomatoes or tomato products.  Mint.  Carbonated drinks, caffeinated drinks, and alcohol.  Vinegar. SEEK IMMEDIATE MEDICAL CARE IF:  You have severe chest pain that goes down your arm or into your jaw  or neck.  You feel sweaty, dizzy, or lightheaded.  You are short of breath.  You vomit blood.  You have difficulty or pain with swallowing.  You have bloody or black, tarry stools.  You have episodes of heartburn more than 3 times a week for more than 2 weeks. MAKE SURE YOU:  Understand these instructions.  Will watch your condition.  Will get help right away if you are not doing well or get worse. Document Released: 10/11/2008 Document Revised: 08/17/2011 Document Reviewed: 11/09/2010 [facility] Patient Information 2014 [place], [place].

## 2013-01-04 ENCOUNTER — Telehealth: Payer: Self-pay | Admitting: *Deleted

## 2013-01-04 LAB — LIPID PANEL
Cholesterol: 143 mg/dL (ref 0–200)
Total CHOL/HDL Ratio: 3 Ratio
Triglycerides: 107 mg/dL (ref <150)
VLDL: 21 mg/dL (ref 0–40)

## 2013-01-04 LAB — COMPLETE METABOLIC PANEL WITH GFR
AST: 15 U/L (ref 0–37)
Alkaline Phosphatase: 89 U/L (ref 39–117)
BUN: 12 mg/dL (ref 6–23)
Creat: 0.52 mg/dL (ref 0.50–1.10)
Potassium: 4.3 mEq/L (ref 3.5–5.3)

## 2013-01-04 NOTE — Telephone Encounter (Signed)
01/04/13 Patient made aware that labs came back normal. P.Sarajane Fambrough,RN BSN [facility]HA

## 2013-01-04 NOTE — Progress Notes (Signed)
Quick Note:  Please inform patient that all labs came back normal.    Rodney Langton, MD, CDE, FAAFP [facility] [place], [place]   ______

## 2013-01-06 ENCOUNTER — Telehealth: Payer: Self-pay

## 2013-01-06 NOTE — Telephone Encounter (Signed)
Pt aware of [facility]  [address]

## 2013-01-06 NOTE — Progress Notes (Signed)
Quick Note:  Please inform patient that her vaginal cytology test came back positive for bacterial vaginosis which is bacterial overgrowth. Please call in patient a prescription for flagyl 500 mg po BID, #14, No Gwenith Daily, MD, CDE, FAAFP [facility] [place], [place]   ______

## 2013-01-06 NOTE — Telephone Encounter (Signed)
Message copied by Lestine Mount on Fri Jan 06, 2013 12:37 PM ------      Message from: Cleora Fleet      Created: Fri Jan 06, 2013  7:55 AM       Please inform patient that her vaginal cytology test came back positive for bacterial vaginosis which is bacterial overgrowth.  Please call in patient a prescription for flagyl 500 mg po BID, #14, No Gwenith Daily, MD, CDE, FAAFP      [facility]      [place]      [place], [place]        ------

## 2013-01-06 NOTE — Telephone Encounter (Signed)
Alexandra Norris  Can you call the patient with her results Let her know i [facility] [address] Flagyl 500 mg po BID #14

## 2013-01-08 NOTE — Progress Notes (Signed)
Quick Note:  Please inform patient that PAP smear was negative (repeat in 3 Years) but yeast infection was present. Please call in prescription for fluconazole 150 mg, take 1 po x 1 dose, dispense #1, no Gwenith Daily, MD, CDE, FAAFP [facility] [place], [place]   ______

## 2013-01-09 ENCOUNTER — Telehealth: Payer: Self-pay | Admitting: *Deleted

## 2013-01-09 NOTE — Telephone Encounter (Signed)
01/09/13 Patient made aware that PAP smear was negative(repeat in 3 years) but has a yeast infection. Will call in  Prescription for Fluconazole 150mg  take 1 po x 1 dose. Pharmacy [facility] [address] per pt request. P.Ellise Kovack,RN BSN [facility]HA

## 2013-01-25 ENCOUNTER — Emergency Department (INDEPENDENT_AMBULATORY_CARE_PROVIDER_SITE_OTHER): Payer: No Typology Code available for payment source

## 2013-01-25 ENCOUNTER — Encounter (HOSPITAL_COMMUNITY): Payer: Self-pay | Admitting: Emergency Medicine

## 2013-01-25 ENCOUNTER — Emergency Department (INDEPENDENT_AMBULATORY_CARE_PROVIDER_SITE_OTHER)
Admission: EM | Admit: 2013-01-25 | Discharge: 2013-01-25 | Disposition: A | Discharge status: Home / Self Care | Payer: No Typology Code available for payment source | Source: Home / Self Care

## 2013-01-25 DIAGNOSIS — J0110 Acute frontal sinusitis, unspecified: Secondary | ICD-10-CM

## 2013-01-25 DIAGNOSIS — R509 Fever, unspecified: Secondary | ICD-10-CM

## 2013-01-25 DIAGNOSIS — J0100 Acute maxillary sinusitis, unspecified: Secondary | ICD-10-CM

## 2013-01-25 IMAGING — CR DG CHEST 2V
2 series · 2 of 2 positions shown · non-contrast
Comparison: [DATE]

CLINICAL DATA: Upper respiratory infection, bronchitis

CHEST - 2 VIEW

[view not recorded (1 of 2)]
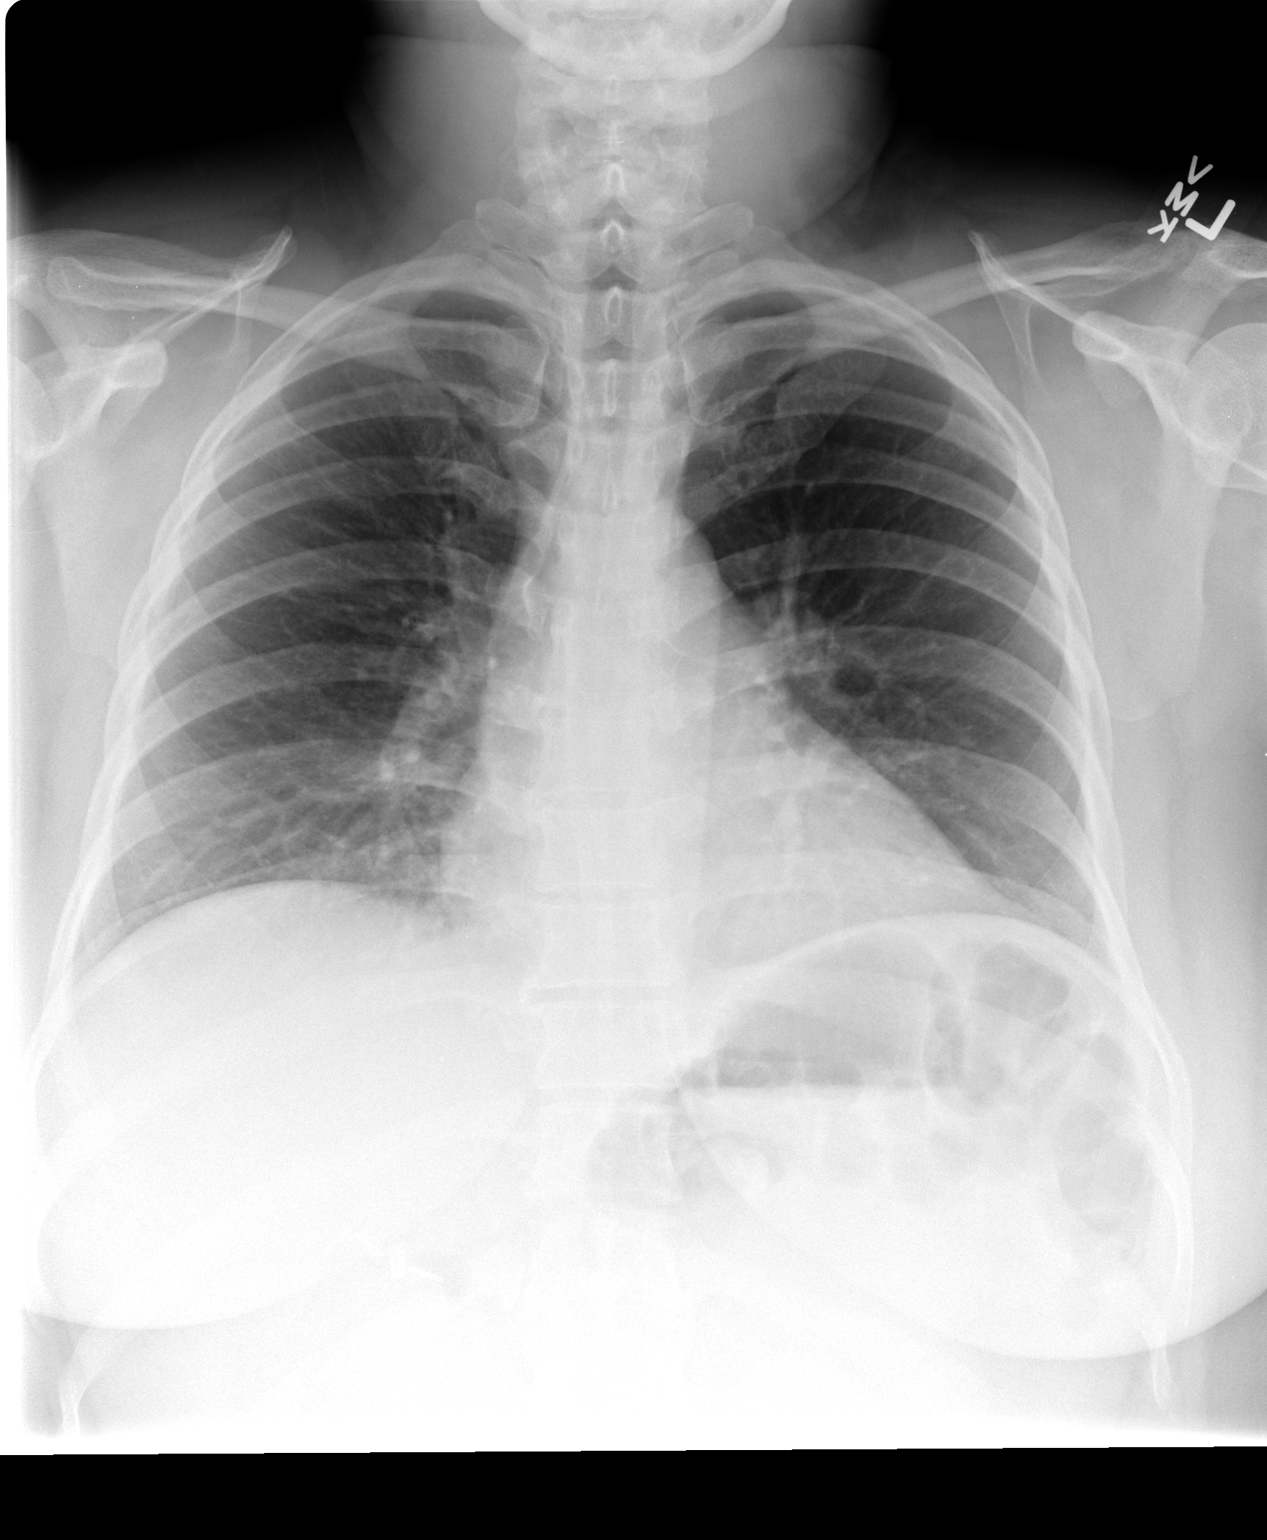

[view not recorded (2 of 2)]
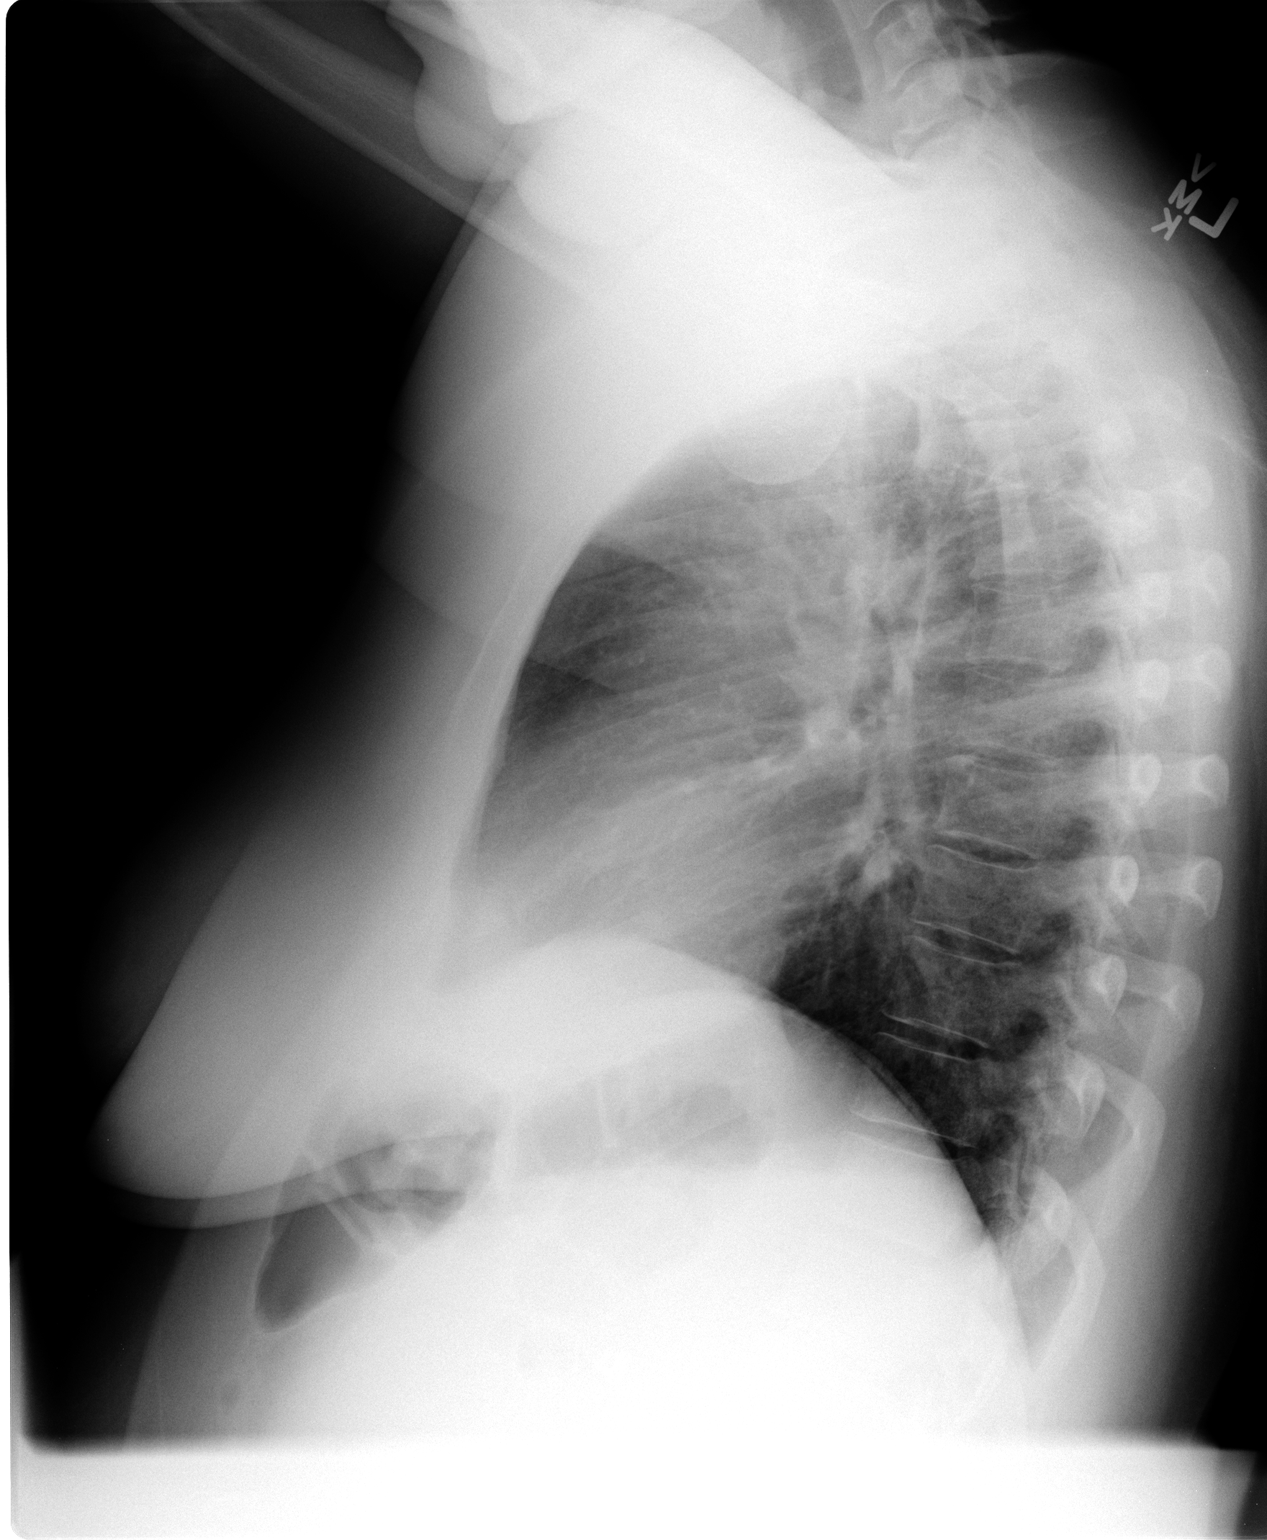

[2 of 2 positions shown; findings below may reference images not displayed]

FINDINGS: Cardiomediastinal silhouette is unremarkable.  No acute
infiltrate or pleural effusion.  No pulmonary edema.  Bony thorax
is unremarkable.
IMPRESSION: No active disease.

## 2013-01-25 MED ORDER — DOXYCYCLINE HYCLATE 100 MG PO TABS: 100.0000 mg | ORAL_TABLET | Freq: Two times a day (BID) | ORAL | Status: DC

## 2013-01-25 NOTE — ED Provider Notes (Signed)
CSN: [id number]     Arrival date & time 01/25/13  1839 History     None    Chief Complaint  Patient presents with  . URI   (Consider location/radiation/quality/duration/timing/severity/associated sxs/prior Treatment) Patient is a 37 y.o. female presenting with URI.  URI Presenting symptoms: fatigue and fever   Associated symptoms: no wheezing     Runny nose adn general cold symptoms started on Monday. Associated w/ HA and fever to 103. Ibuprofen and tylenolo w/o benefit. 2 previous hospitalizations for sever bronchitis. Difficult to breath. Wheezing.   Past Medical History  Diagnosis Date  . History of bronchitis     "I've stayed in hospital 2 X w/bronchitis"  . Asthma   . History of blood transfusion 1999    w/vaginal delivery   Past Surgical History  Procedure Laterality Date  . Cholecystectomy  2011  . Cesarean section  1997; 2008  . Tubal ligation  04/2007   Family History  Problem Relation Age of Onset  . Diabetes Mother   . Osteoarthritis Mother   . Hypertension Mother    History  Substance Use Topics  . Smoking status: Never Smoker   . Smokeless tobacco: Never Used  . Alcohol Use: No   OB History   Grav Para Term Preterm Abortions TAB SAB Ect Mult Living                 Review of Systems  Constitutional: Positive for fever and fatigue.  Respiratory: Positive for shortness of breath. Negative for wheezing and stridor.   Cardiovascular: Negative for chest pain.  All other systems reviewed and are negative.    Allergies  Review of patient's allergies indicates no known allergies.  Home Medications   Current Outpatient Rx  Name  Route  Sig  Dispense  Refill  . albuterol (PROVENTIL,VENTOLIN) 90 MCG/ACT inhaler   Inhalation   Inhale 2 puffs into the lungs every 4 (four) hours as needed. For wheeze or shortness of breath         . ibuprofen (ADVIL,MOTRIN) 200 MG tablet   Oral   Take 400 mg by mouth every 6 (six) hours as needed for pain.          Marland Kitchen ondansetron (ZOFRAN) 4 MG tablet   Oral   Take 1 tablet (4 mg total) by mouth every 6 (six) hours as needed for nausea.   12 tablet   0    BP 123/71  Pulse 84  Temp(Src) 98.4 F (36.9 C) (Oral)  Resp 16  SpO2 100%  LMP 12/06/2012 Physical Exam  Constitutional: She is oriented to person, place, and time. She appears well-developed and well-nourished.  HENT:  Head: Normocephalic.  Maxillary and frontal sinus pressure and ttp Nasal sinus congestion  Eyes: EOM are normal. Pupils are equal, round, and reactive to light.  Cardiovascular: Normal rate and normal heart sounds.   Pulmonary/Chest: Effort normal. She has no wheezes.  Decreased breath sounds in RL base  Abdominal: Soft.  Musculoskeletal: Normal range of motion.  Neurological: She is alert and oriented to person, place, and time.  Skin: Skin is warm and dry.  Psychiatric: She has a normal mood and affect. Her behavior is normal. Judgment and thought content normal.    ED Course   Procedures (including critical care time)  Labs Reviewed  POCT RAPID STREP A ([facility]RG CARE ONLY)   No results found. No diagnosis found.  MDM  37yo F w/ acute sinusitis w/ h/o multiple hospitalizations  for respiratory infections. No sign of respiratory compromise. Using albuterol BID - Doxycyxline - Inc home albuterol to 4 times a day for 2 days - ibuprofen 600mg  every 6 hours.  - all questions answered - precautions given  Shelly Flatten, MD Family Medicine PGY-3 01/25/2013, 8:41 PM    Ozella Rocks, MD 01/25/13 2041

## 2013-01-25 NOTE — ED Notes (Signed)
Pt c/o cold sxs onset Monday... Reports family has been sick w/colds Sxs include: BA, HA, fatigue, CP due to dry cough, fevers, nasal congestion, runny nose. Hx of asthma Denies: v/n/d, SOB, wheezing

## 2013-01-26 NOTE — ED Provider Notes (Signed)
Medical screening examination/treatment/procedure(s) were performed by resident physician or non-physician practitioner and as supervising physician I was immediately available for consultation/collaboration.   Denvil Canning DOUGLAS MD.   Chandlar Staebell D Madhavi Hamblen, MD 01/26/13 1736 

## 2013-01-29 ENCOUNTER — Telehealth (HOSPITAL_COMMUNITY): Payer: Self-pay | Admitting: *Deleted

## 2013-01-29 LAB — CULTURE, GROUP A STREP

## 2013-01-29 MED ORDER — AMOXICILLIN 500 MG PO CAPS: 500.0000 mg | ORAL_CAPSULE | Freq: Three times a day (TID) | ORAL | Status: DC

## 2013-01-29 NOTE — ED Notes (Signed)
I called Librarian, academic for throat culture result.  She said someone would call me back in 10 min. Alexandra Norris 01/29/2013

## 2013-01-29 NOTE — ED Notes (Signed)
Throat culture: Strep beta hemolytic strep not group A.  Pt. treated with Doxycycline for sinusitis.  Lab shown to [employer] PA and she said to tell pt. to finish Doxycycline and start Amoxicillin for the Strep throat. [facility] [address].  I called pt. and left a message to call. Vassie Moselle 01/29/2013

## 2013-01-30 ENCOUNTER — Telehealth (HOSPITAL_COMMUNITY): Payer: Self-pay | Admitting: *Deleted

## 2013-01-30 NOTE — ED Notes (Signed)
I called pt. Pt. verified x 2 and given results.  Pt. asked if there was someone here who speaks [place]. I asked Dr. Tressia Danas.  She gave pt. results and told her to pick up Rx. of Amoxicillin at the [facility].  Pt. Still has sore throat.  She instructed pt. to finish Doxycycline and also take a Probiotic to help her GI tract recover from all the antibiotics. Pt. voiced understanding. Vassie Moselle 01/30/2013

## 2013-03-13 ENCOUNTER — Emergency Department (HOSPITAL_COMMUNITY): Payer: No Typology Code available for payment source

## 2013-03-13 ENCOUNTER — Encounter (HOSPITAL_COMMUNITY): Payer: Self-pay | Admitting: Emergency Medicine

## 2013-03-13 ENCOUNTER — Emergency Department (HOSPITAL_COMMUNITY)
Admission: EM | Admit: 2013-03-13 | Discharge: 2013-03-14 | Disposition: A | Discharge status: Home / Self Care | Payer: No Typology Code available for payment source | Attending: Emergency Medicine | Admitting: Emergency Medicine

## 2013-03-13 DIAGNOSIS — Z8709 Personal history of other diseases of the respiratory system: Secondary | ICD-10-CM | POA: Insufficient documentation

## 2013-03-13 DIAGNOSIS — K5900 Constipation, unspecified: Secondary | ICD-10-CM

## 2013-03-13 DIAGNOSIS — R112 Nausea with vomiting, unspecified: Secondary | ICD-10-CM | POA: Insufficient documentation

## 2013-03-13 DIAGNOSIS — R630 Anorexia: Secondary | ICD-10-CM | POA: Insufficient documentation

## 2013-03-13 DIAGNOSIS — Z3202 Encounter for pregnancy test, result negative: Secondary | ICD-10-CM | POA: Insufficient documentation

## 2013-03-13 DIAGNOSIS — Z9189 Other specified personal risk factors, not elsewhere classified: Secondary | ICD-10-CM | POA: Insufficient documentation

## 2013-03-13 DIAGNOSIS — R1031 Right lower quadrant pain: Secondary | ICD-10-CM | POA: Insufficient documentation

## 2013-03-13 DIAGNOSIS — N898 Other specified noninflammatory disorders of vagina: Secondary | ICD-10-CM | POA: Insufficient documentation

## 2013-03-13 DIAGNOSIS — Z79899 Other long term (current) drug therapy: Secondary | ICD-10-CM | POA: Insufficient documentation

## 2013-03-13 DIAGNOSIS — R109 Unspecified abdominal pain: Secondary | ICD-10-CM

## 2013-03-13 DIAGNOSIS — J45909 Unspecified asthma, uncomplicated: Secondary | ICD-10-CM | POA: Insufficient documentation

## 2013-03-13 LAB — URINALYSIS, ROUTINE W REFLEX MICROSCOPIC
Glucose, UA: NEGATIVE mg/dL
Ketones, ur: NEGATIVE mg/dL
Protein, ur: NEGATIVE mg/dL
Urobilinogen, UA: 1 mg/dL (ref 0.0–1.0)

## 2013-03-13 LAB — URINE MICROSCOPIC-ADD ON

## 2013-03-13 LAB — COMPREHENSIVE METABOLIC PANEL
Alkaline Phosphatase: 111 U/L (ref 39–117)
BUN: 12 mg/dL (ref 6–23)
CO2: 21 mEq/L (ref 19–32)
Chloride: 105 mEq/L (ref 96–112)
Creatinine, Ser: 0.46 mg/dL — ABNORMAL LOW (ref 0.50–1.10)
GFR calc non Af Amer: >90 mL/min (ref >90)
Glucose, Bld: 126 mg/dL — ABNORMAL HIGH (ref 70–99)
Potassium: 4 mEq/L (ref 3.5–5.1)
Total Bilirubin: 0.4 mg/dL (ref 0.3–1.2)

## 2013-03-13 LAB — CBC WITH DIFFERENTIAL/PLATELET
HCT: 40.2 % (ref 36.0–46.0)
Hemoglobin: 13.6 g/dL (ref 12.0–15.0)
Lymphocytes Relative: 25 % (ref 12–46)
Lymphs Abs: 3 10*3/uL (ref 0.7–4.0)
MCHC: 33.8 g/dL (ref 30.0–36.0)
Monocytes Absolute: 0.7 10*3/uL (ref 0.1–1.0)
Monocytes Relative: 6 % (ref 3–12)
Neutro Abs: 7.8 10*3/uL — ABNORMAL HIGH (ref 1.7–7.7)
Neutrophils Relative %: 66 % (ref 43–77)
RBC: 4.87 MIL/uL (ref 3.87–5.11)
WBC: 11.7 10*3/uL — ABNORMAL HIGH (ref 4.0–10.5)

## 2013-03-13 LAB — POCT PREGNANCY, URINE: Preg Test, Ur: NEGATIVE

## 2013-03-13 LAB — WET PREP, GENITAL
Clue Cells Wet Prep HPF POC: NONE SEEN
Trich, Wet Prep: NONE SEEN

## 2013-03-13 IMAGING — US US PELVIS COMPLETE
1 series · 13 of 25 positions shown · non-contrast
Comparison: Prior pelvic ultrasound [DATE];

CLINICAL DATA: Right lower quadrant pain evaluate for ovarian
torsion

TRANSABDOMINAL AND TRANSVAGINAL ULTRASOUND OF PELVIS
DOPPLER ULTRASOUND OF OVARIES
TECHNIQUE: Both transabdominal and transvaginal ultrasound
examinations of the pelvis were performed. Transabdominal technique
was performed for global imaging of the pelvis including uterus,
ovaries, adnexal regions, and pelvic cul-de-sac.
It was necessary to proceed with endovaginal exam following the
transabdominal exam to visualize the uterus, endometrium and
bilateral ovaries.
Color and duplex Doppler ultrasound was utilized to evaluate blood
flow to the ovaries.

[Series 1: us pelvis complete · 0.20mm/px · 13 of 68 slices shown]
[im 1/68]
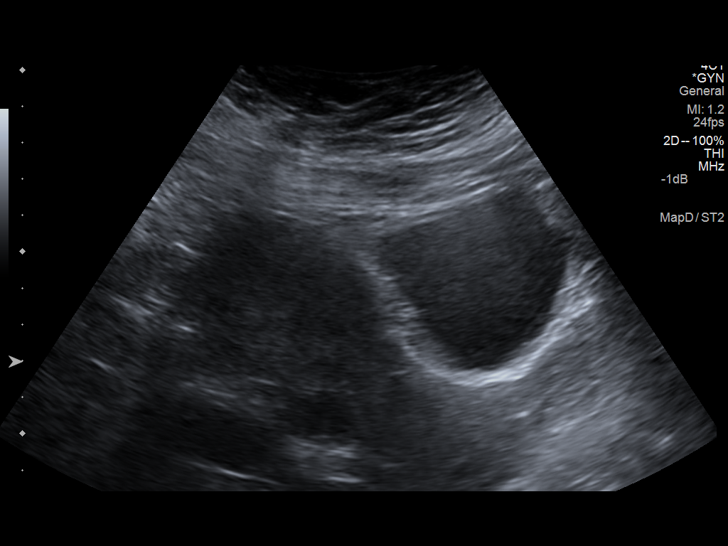
[im 6/68]
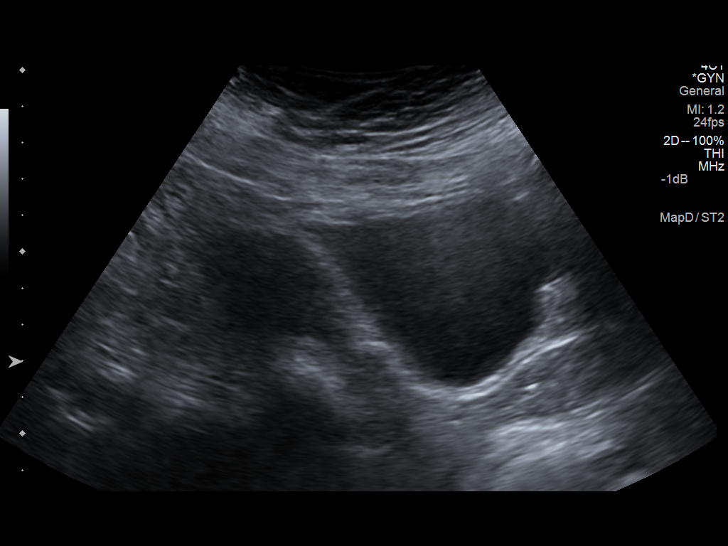
[im 12/68]
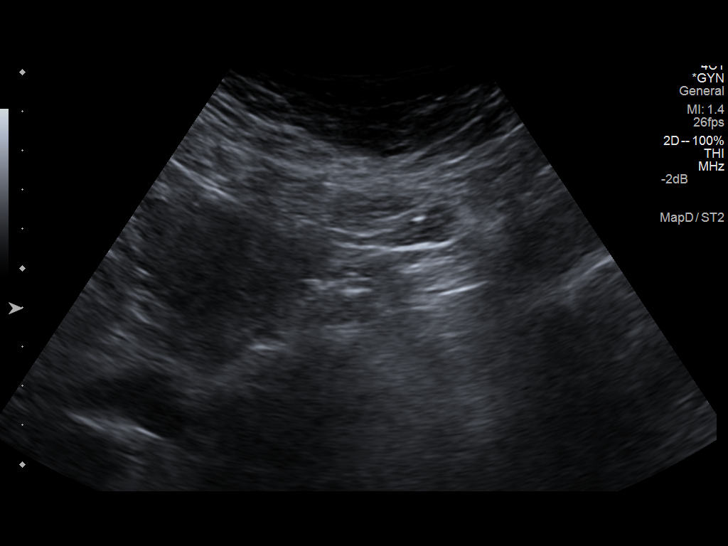
[im 17/68]
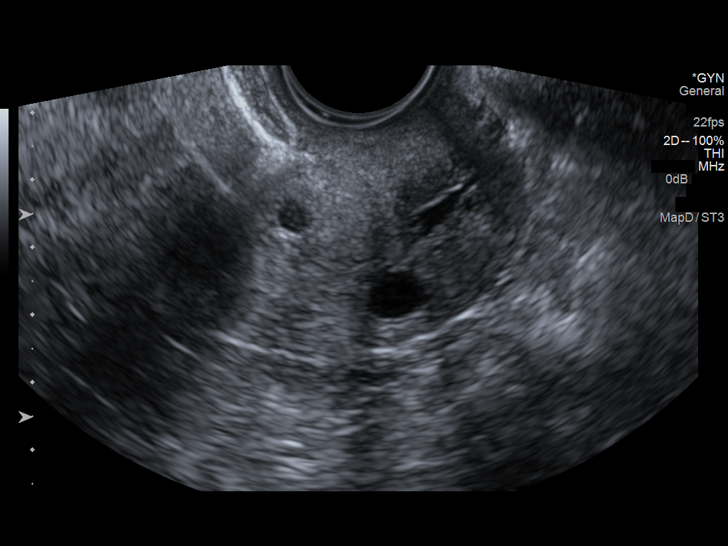
[im 23/68]
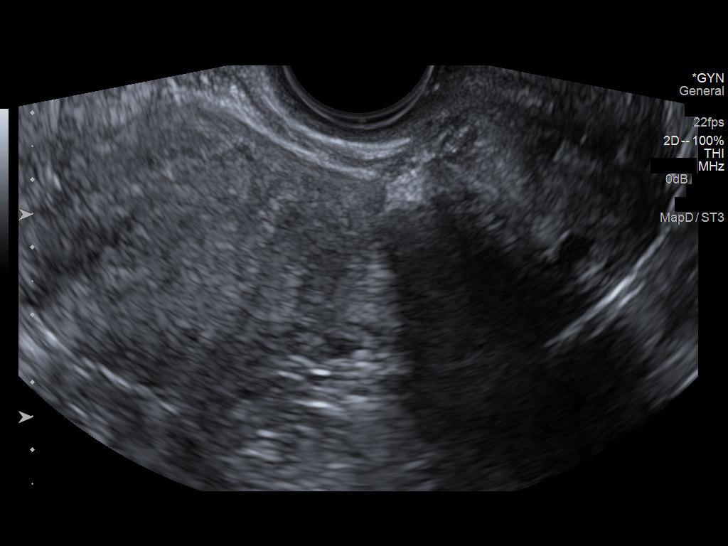
[im 28/68]
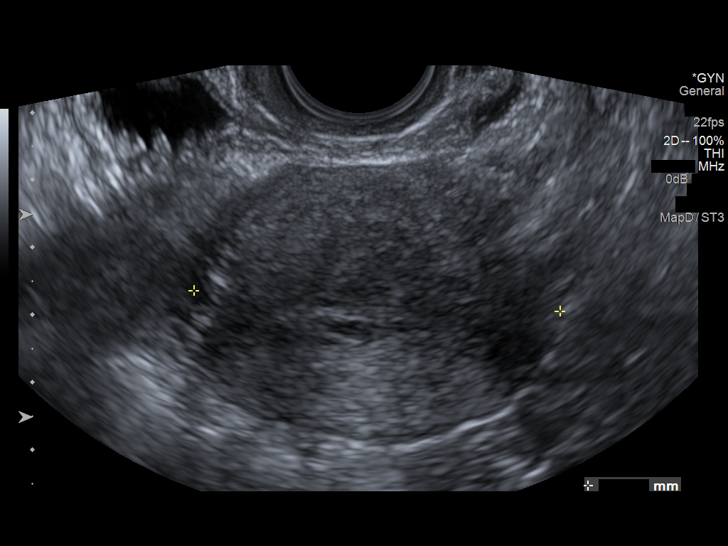
[im 34/68]
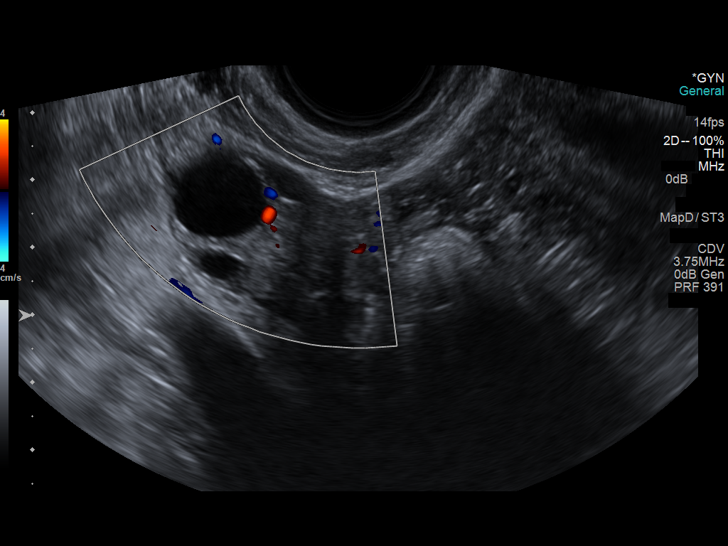
[im 40/68]
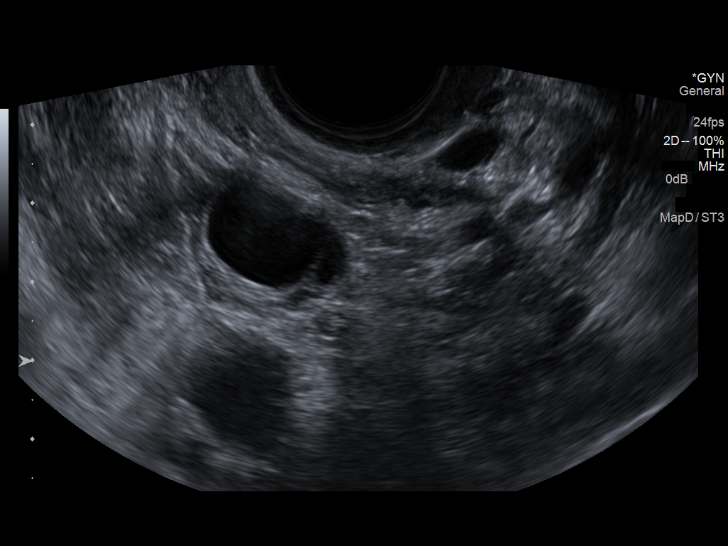
[im 45/68]
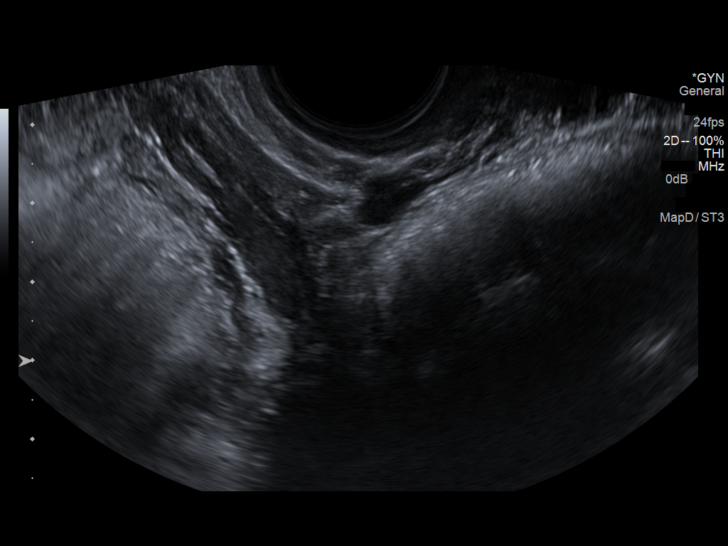
[im 51/68]
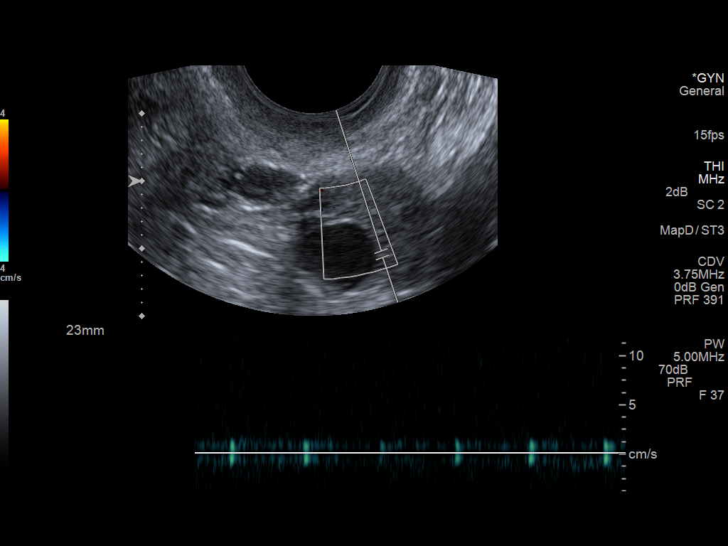
[im 56/68]
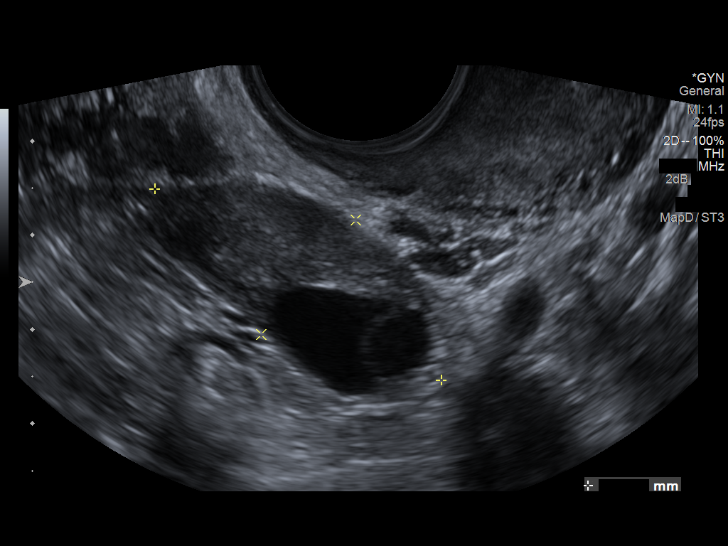
[im 62/68]
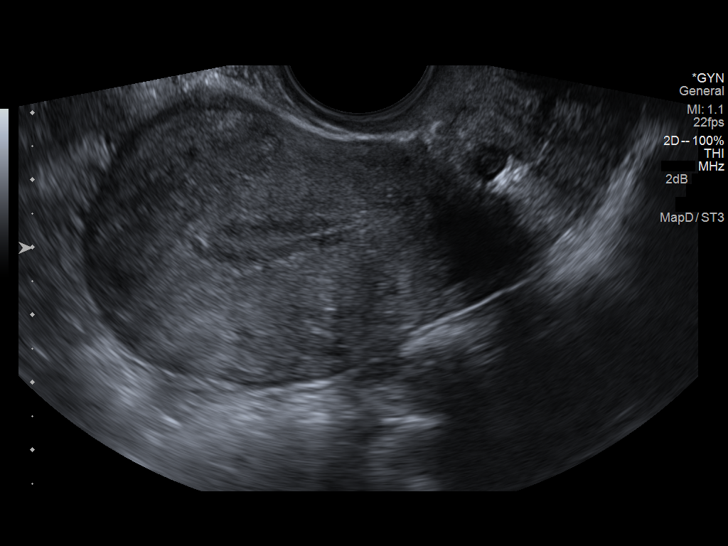
[im 68/68]
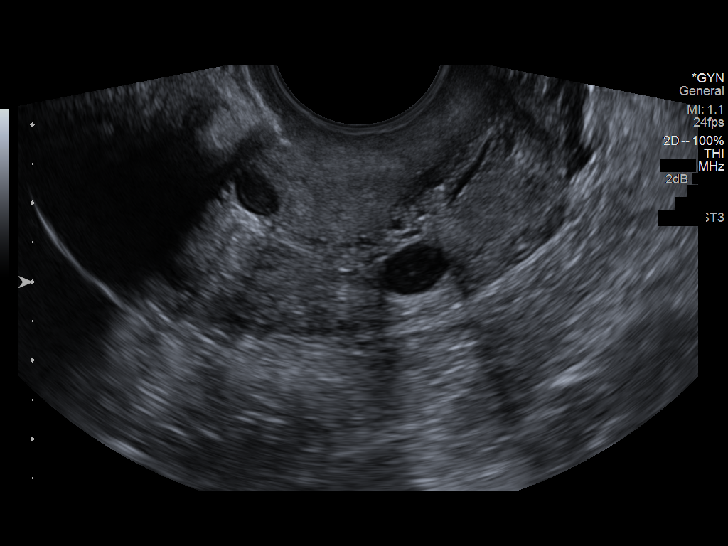

[13 of 25 positions shown; findings below may reference images not displayed]

prior CT
abdomen/pelvis also [DATE]

FINDINGS

Uterus:  The uterus is within normal limits for size at 9.2 x 4.6 x
5.4 cm.  The myometrium is slightly inhomogeneous as seen on the
recent prior study.  No discrete mass or lesion. Nabothian cysts
noted incidentally.

Endometrium:  Within normal limits at 5.4 mm.

Right ovary: Sonographically unremarkable.  3.3 x 1.8 x 2.7 cm.
Normal follicular cysts.

Left ovary: Sonographically unremarkable ovary measures 3.7 x 1.6 x
1.9 cm.  Normal follicular cysts.

Pulsed Doppler evaluation demonstrates normal low-resistance
arterial and venous waveforms in both ovaries.
IMPRESSION: No acute abnormality.  Similar appearance of minimally
heterogeneous uterine myometrium which is nonspecific and may
normal for this patient, or reflect small uterine fibroids, or
potentially adenomyosis although this is thought less likely.

No sonographic evidence for ovarian torsion.

## 2013-03-13 IMAGING — CR DG ABDOMEN ACUTE W/ 1V CHEST
3 series · 3 of 3 positions shown · non-contrast
Comparison: Two-view chest x-ray [DATE].  CT abdomen and pelvis
[DATE].

CLINICAL DATA: Initial encounter for current episode of right lower
quadrant abdominal pain which began yesterday.  No fever.  Surgical
history includes cholecystectomy and bilateral tubal ligation.

ACUTE ABDOMEN SERIES (ABDOMEN 2 VIEW & CHEST 1 VIEW)

[w chest pa]
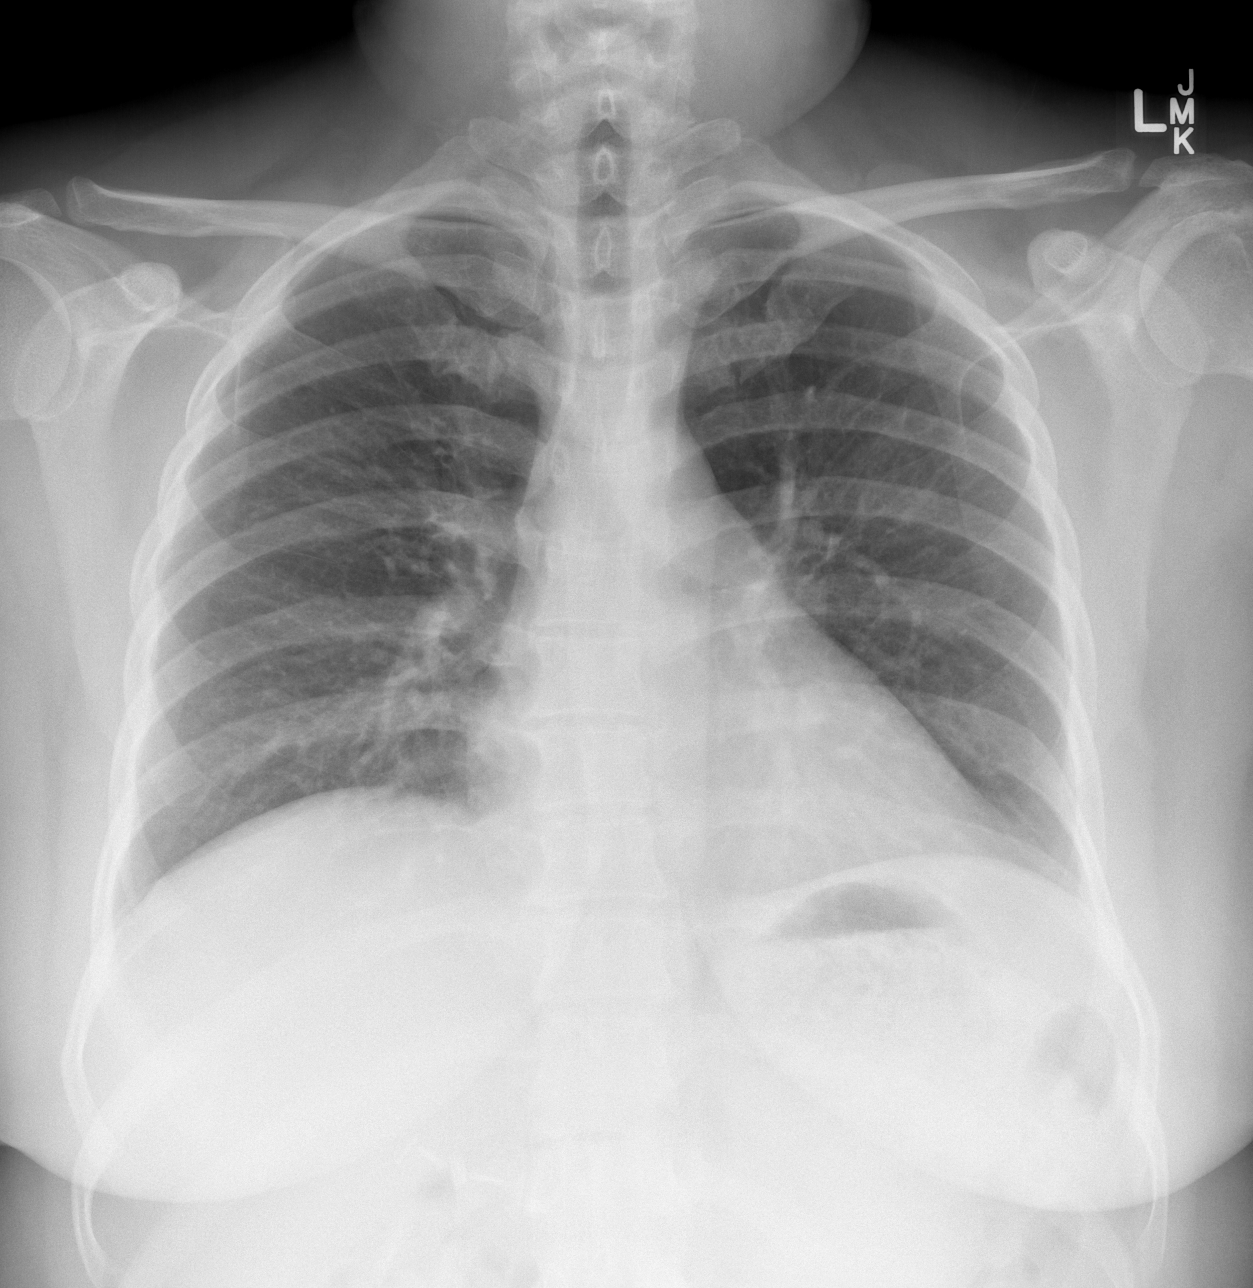

[w abdomen upright]
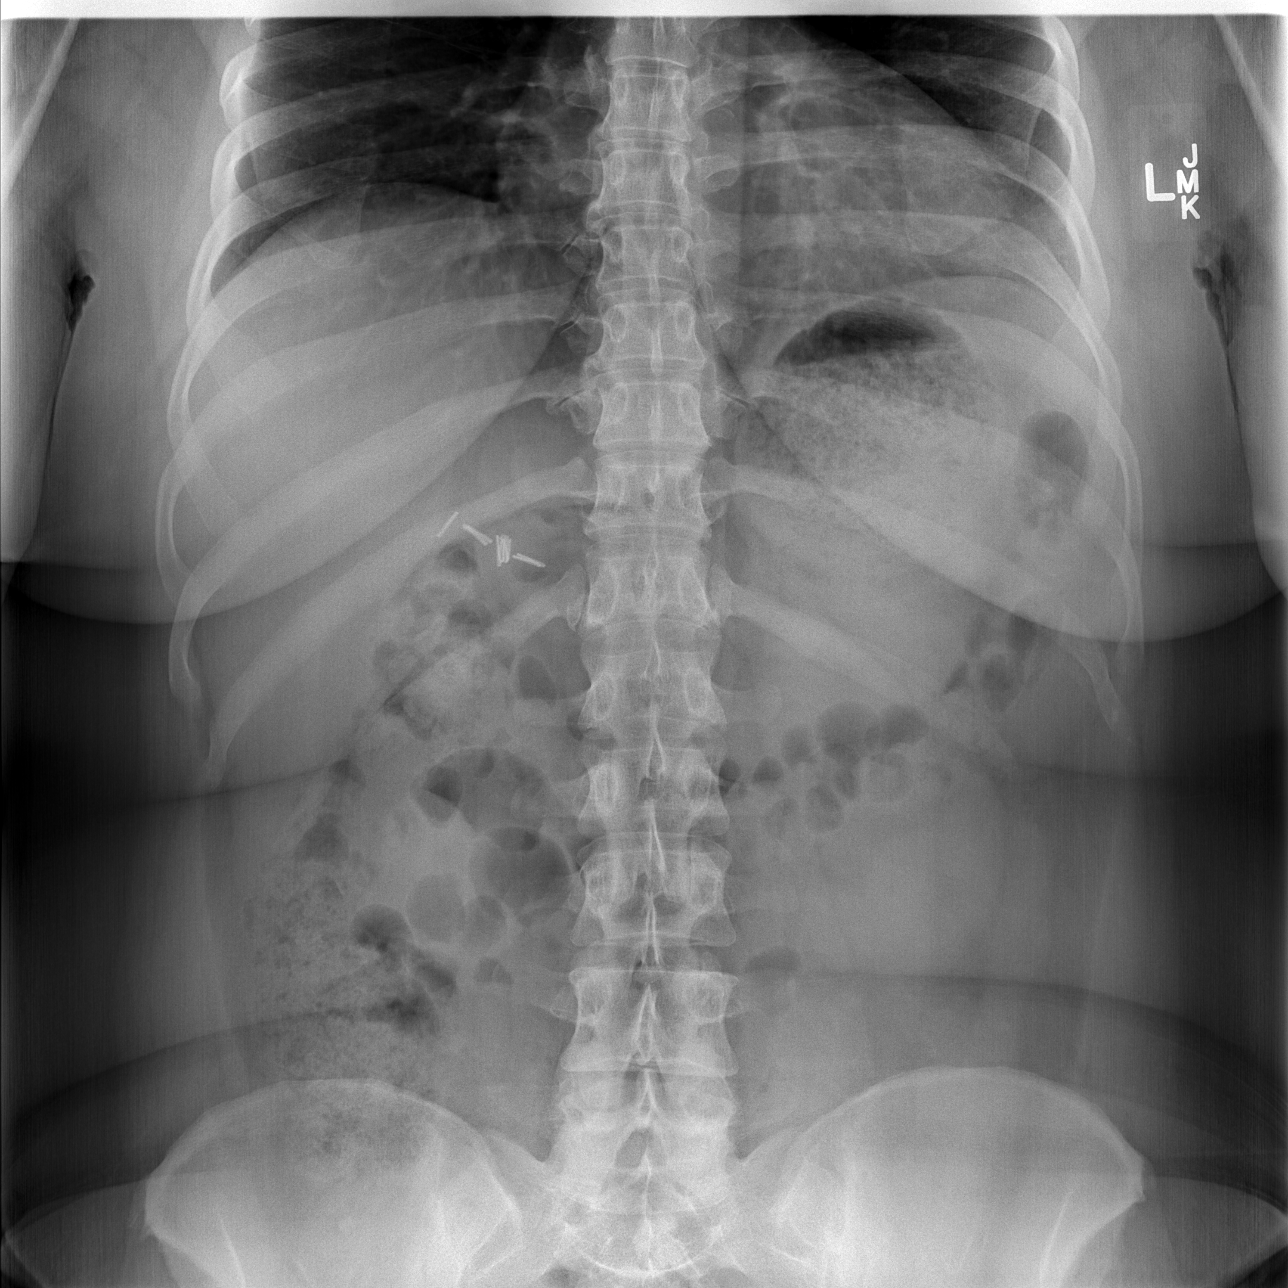

[t abdomen supine]
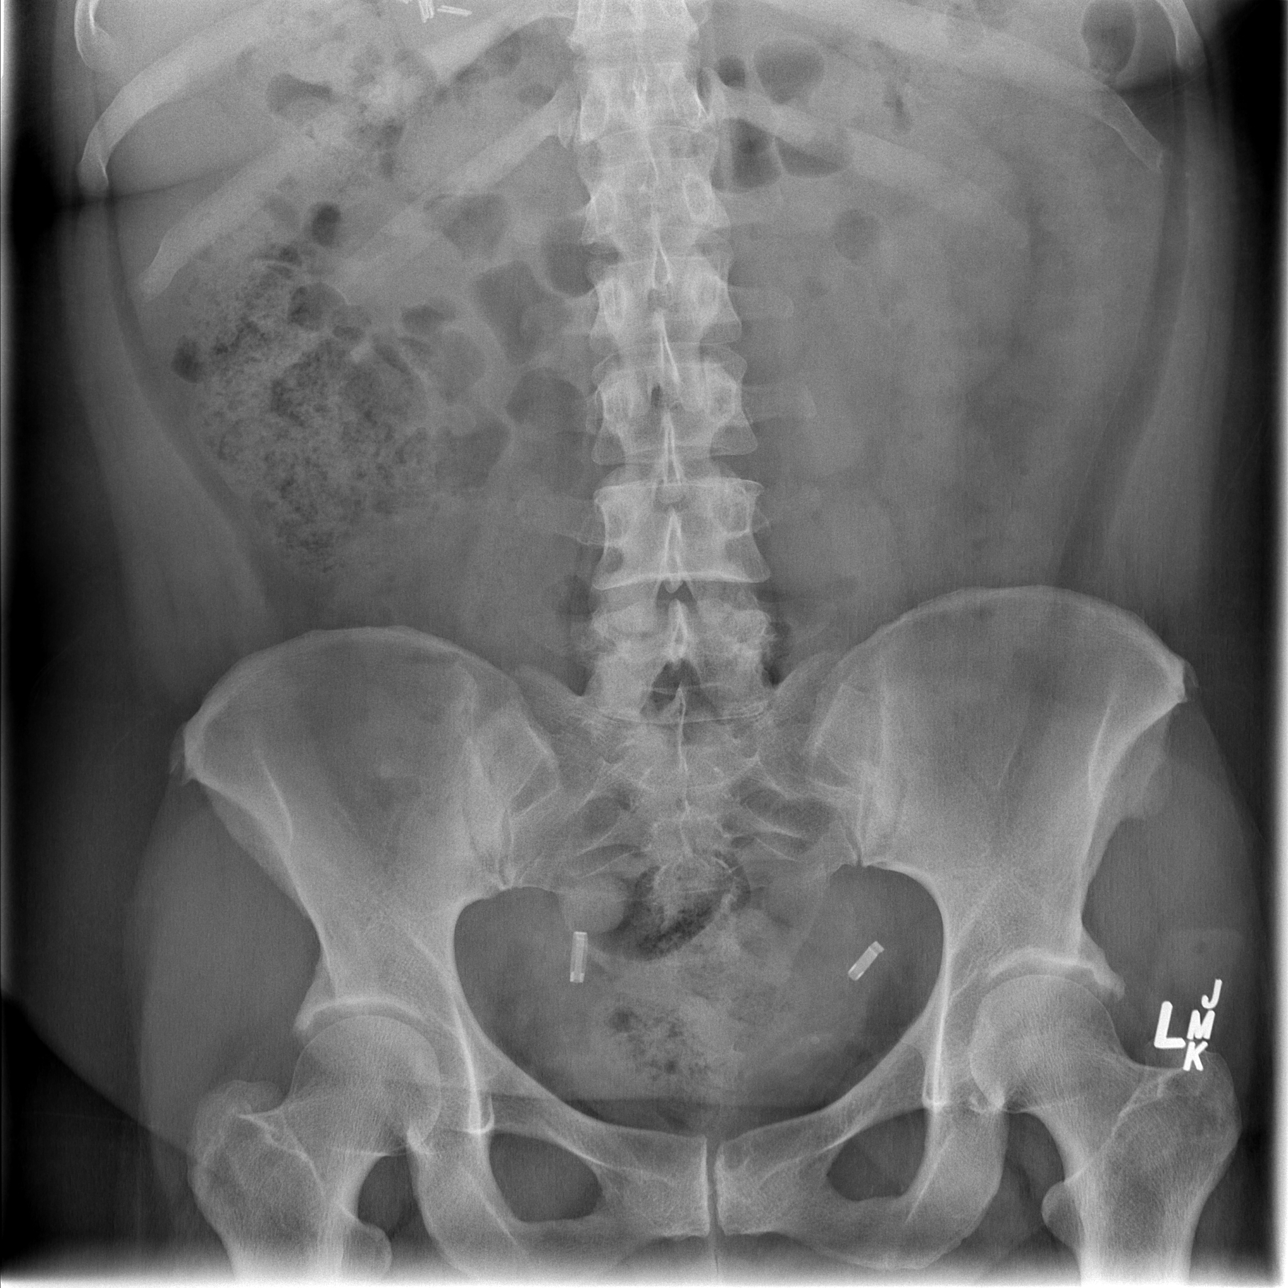

[3 of 3 positions shown; findings below may reference images not displayed]

FINDINGS: Bowel gas pattern unremarkable without evidence of
obstruction or significant ileus.  No evidence of free air or
significant air fluid levels on the erect image.  Moderate stool
burden in the cecum and ascending colon and in the rectum. Food
within the stomach.  Surgical clips in the right upper quadrant
from prior cholecystectomy.  Bilateral tubal ligation clips in the
pelvis.  Phlebolith low in the left side of the pelvis.  No visible
opaque urinary tract calculi.  Regional skeleton unremarkable.

Cardiomediastinal silhouette unremarkable, unchanged.  Lungs clear.
Bronchovascular markings normal.  Pulmonary vascularity normal.  No
pneumothorax.  No pleural effusions.
IMPRESSION: No acute abdominal or pulmonary abnormality.

## 2013-03-13 MED ORDER — ONDANSETRON HCL 4 MG/2ML IJ SOLN
4.0000 mg | Freq: Once | INTRAMUSCULAR | Status: AC
  Administered 2013-03-13: 4 mg via INTRAVENOUS
  Filled 2013-03-13: qty 2

## 2013-03-13 MED ORDER — HYDROMORPHONE HCL PF 1 MG/ML IJ SOLN
1.0000 mg | Freq: Once | INTRAMUSCULAR | Status: AC
  Administered 2013-03-14: 1 mg via INTRAVENOUS
  Filled 2013-03-13: qty 1

## 2013-03-13 MED ORDER — MORPHINE SULFATE 4 MG/ML IJ SOLN
4.0000 mg | Freq: Once | INTRAMUSCULAR | Status: AC
  Administered 2013-03-13: 4 mg via INTRAVENOUS
  Filled 2013-03-13: qty 1

## 2013-03-13 MED ORDER — SODIUM CHLORIDE 0.9 % IV BOLUS (SEPSIS)
1000.0000 mL | Freq: Once | INTRAVENOUS | Status: AC
  Administered 2013-03-13: 1000 mL via INTRAVENOUS

## 2013-03-13 NOTE — ED Notes (Signed)
In with Alexandra Norris for pelvic exam. Pt tolerated well.

## 2013-03-13 NOTE — ED Notes (Signed)
Pt c/o RLQ pain x 2 days worse today; pt sts no BM x 3 days; pt denies dysuria; pt sts LMP was 9/25

## 2013-03-13 NOTE — ED Provider Notes (Signed)
CSN: [id number]     Arrival date & time 03/13/13  1716 History   First MD Initiated Contact with Patient 03/13/13 2029     Chief Complaint  Patient presents with  . Abdominal Pain   HPI  History provided by the patient through a Spanish interpreter. Patient is a 37 year old Hispanic female with history of asthma, cholecystectomy, cesarean section and tubal ligation who presents with complaints of right lower abdominal pain. Pain has been worsening over the last 2 days. It has been associated with nausea, vomiting and some constipation. Patient reports last bowel movement on Saturday.. She has used some over-the-counter stool softeners without change. She reports having similar pains in the right lower abdomen several months ago and was evaluated without any specific cause. She has been having a decreased appetite today. Denies any associated fever, chills or sweats. Last menstrual period was on the 25th of last month and normal. Denies any vaginal bleeding or discharge. No other aggravating or alleviating factors. No other associated symptoms. Denies any sick contacts. Denies any recent travel.    Past Medical History  Diagnosis Date  . History of bronchitis     "I've stayed in hospital 2 X w/bronchitis"  . Asthma   . History of blood transfusion 1999    w/vaginal delivery   Past Surgical History  Procedure Laterality Date  . Cholecystectomy  2011  . Cesarean section  1997; 2008  . Tubal ligation  04/2007   Family History  Problem Relation Age of Onset  . Diabetes Mother   . Osteoarthritis Mother   . Hypertension Mother    History  Substance Use Topics  . Smoking status: Never Smoker   . Smokeless tobacco: Never Used  . Alcohol Use: No   OB History   Grav Para Term Preterm Abortions TAB SAB Ect Mult Living                 Review of Systems  Constitutional: Positive for appetite change. Negative for fever, chills and diaphoresis.  Respiratory: Negative for cough and  shortness of breath.   Cardiovascular: Negative for chest pain.  Gastrointestinal: Positive for nausea, vomiting, abdominal pain and constipation. Negative for diarrhea, blood in stool and rectal pain.  Genitourinary: Negative for dysuria, frequency, hematuria, flank pain, vaginal bleeding, vaginal discharge, vaginal pain and menstrual problem.  All other systems reviewed and are negative.    Allergies  Review of patient's allergies indicates no known allergies.  Home Medications   Current Outpatient Rx  Name  Route  Sig  Dispense  Refill  . albuterol (PROVENTIL,VENTOLIN) 90 MCG/ACT inhaler   Inhalation   Inhale 2 puffs into the lungs 2 (two) times daily as needed for wheezing or shortness of breath. For wheeze or shortness of breath         . ibuprofen (ADVIL,MOTRIN) 200 MG tablet   Oral   Take 800 mg by mouth every 6 (six) hours as needed for pain.           BP 140/69  Pulse 89  Temp(Src) 98.4 F (36.9 C) (Oral)  Resp 16  Ht 5\' 1"  (1.549 m)  Wt 201 lb 12.8 oz (91.536 kg)  BMI 38.15 kg/m2  SpO2 99%  LMP 03/02/2013 Physical Exam  Nursing note and vitals reviewed. Constitutional: She is oriented to person, place, and time. She appears well-developed and well-nourished. No distress.  HENT:  Head: Normocephalic.  Cardiovascular: Normal rate and regular rhythm.   Pulmonary/Chest: Effort normal and breath  sounds normal. No respiratory distress. She has no wheezes. She has no rales.  Abdominal: Soft. Normal appearance. There is tenderness in the right lower quadrant. There is no rigidity, no rebound, no guarding, no CVA tenderness and negative Murphy's sign.  Old surgical scar consistent with history of prior cholecystectomy  Genitourinary:  Chaperone was present. Patient with thick white vaginal discharge and slight clear cervical discharge. There is reproducible tenderness in the right adnexa without mass or fullness. There is also tenderness over the uterus without  appreciable mass. CMT tenderness. No vaginal bleeding. External genitals normal  Neurological: She is alert and oriented to person, place, and time.  Skin: Skin is warm and dry. No rash noted.  Psychiatric: She has a normal mood and affect. Her behavior is normal.    ED Course  Procedures   Results for orders placed during the hospital encounter of 03/13/13  WET PREP, GENITAL      Result Value Range   Yeast Wet Prep HPF POC NONE SEEN  NONE SEEN   Trich, Wet Prep NONE SEEN  NONE SEEN   Clue Cells Wet Prep HPF POC NONE SEEN  NONE SEEN   WBC, Wet Prep HPF POC FEW (*) NONE SEEN  CBC WITH DIFFERENTIAL      Result Value Range   WBC 11.7 (*) 4.0 - 10.5 K/uL   RBC 4.87  3.87 - 5.11 MIL/uL   Hemoglobin 13.6  12.0 - 15.0 g/dL   HCT 16.1  09.6 - 04.5 %   MCV 82.5  78.0 - 100.0 fL   MCH 27.9  26.0 - 34.0 pg   MCHC 33.8  30.0 - 36.0 g/dL   RDW 40.9  81.1 - 91.4 %   Platelets 293  150 - 400 K/uL   Neutrophils Relative % 66  43 - 77 %   Neutro Abs 7.8 (*) 1.7 - 7.7 K/uL   Lymphocytes Relative 25  12 - 46 %   Lymphs Abs 3.0  0.7 - 4.0 K/uL   Monocytes Relative 6  3 - 12 %   Monocytes Absolute 0.7  0.1 - 1.0 K/uL   Eosinophils Relative 3  0 - 5 %   Eosinophils Absolute 0.3  0.0 - 0.7 K/uL   Basophils Relative 0  0 - 1 %   Basophils Absolute 0.0  0.0 - 0.1 K/uL  COMPREHENSIVE METABOLIC PANEL      Result Value Range   Sodium 140  135 - 145 mEq/L   Potassium 4.0  3.5 - 5.1 mEq/L   Chloride 105  96 - 112 mEq/L   CO2 21  19 - 32 mEq/L   Glucose, Bld 126 (*) 70 - 99 mg/dL   BUN 12  6 - 23 mg/dL   Creatinine, Ser 7.82 (*) 0.50 - 1.10 mg/dL   Calcium 9.0  8.4 - 95.6 mg/dL   Total Protein 8.0  6.0 - 8.3 g/dL   Albumin 3.9  3.5 - 5.2 g/dL   AST 19  0 - 37 U/L   ALT 16  0 - 35 U/L   Alkaline Phosphatase 111  39 - 117 U/L   Total Bilirubin 0.4  0.3 - 1.2 mg/dL   GFR calc non Af Amer >90  >90 mL/min   GFR calc Af Amer >90  >90 mL/min  URINALYSIS, ROUTINE W REFLEX MICROSCOPIC      Result  Value Range   Color, Urine YELLOW  YELLOW   APPearance HAZY (*) CLEAR   Specific  Gravity, Urine 1.023  1.005 - 1.030   pH 6.0  5.0 - 8.0   Glucose, UA NEGATIVE  NEGATIVE mg/dL   Hgb urine dipstick NEGATIVE  NEGATIVE   Bilirubin Urine NEGATIVE  NEGATIVE   Ketones, ur NEGATIVE  NEGATIVE mg/dL   Protein, ur NEGATIVE  NEGATIVE mg/dL   Urobilinogen, UA 1.0  0.0 - 1.0 mg/dL   Nitrite NEGATIVE  NEGATIVE   Leukocytes, UA TRACE (*) NEGATIVE  URINE MICROSCOPIC-ADD ON      Result Value Range   Squamous Epithelial / LPF MANY (*) RARE   WBC, UA 3-6  <3 WBC/hpf   RBC / HPF 0-2  <3 RBC/hpf   Bacteria, UA MANY (*) RARE  POCT PREGNANCY, URINE      Result Value Range   Preg Test, Ur NEGATIVE  NEGATIVE       Imaging Review US Transvaginal Non-ob  03/13/2013   *RADIOLOGY REPORT*  Clinical Data:  Right lower quadrant pain evaluate for ovarian torsion  TRANSABDOMINAL AND TRANSVAGINAL ULTRASOUND OF PELVIS DOPPLER ULTRASOUND OF OVARIES  Technique:  Both transabdominal and transvaginal ultrasound examinations of the pelvis were performed. Transabdominal technique was performed for global imaging of the pelvis including uterus, ovaries, adnexal regions, and pelvic cul-de-sac.  It was necessary to proceed with endovaginal exam following the transabdominal exam to visualize the uterus, endometrium and bilateral ovaries.  Color and duplex Doppler ultrasound was utilized to evaluate blood flow to the ovaries.  Comparison:  Prior pelvic ultrasound 09/12/2012; prior CT abdomen/pelvis also 09/12/2012  FINDINGS  Uterus:  The uterus is within normal limits for size at 9.2 x 4.6 x 5.4 cm.  The myometrium is slightly inhomogeneous as seen on the recent prior study.  No discrete mass or lesion. Nabothian cysts noted incidentally.  Endometrium:  Within normal limits at 5.4 mm.  Right ovary: Sonographically unremarkable.  3.3 x 1.8 x 2.7 cm. Normal follicular cysts.  Left ovary: Sonographically unremarkable ovary measures 3.7  x 1.6 x 1.9 cm.  Normal follicular cysts.  Pulsed Doppler evaluation demonstrates normal low-resistance arterial and venous waveforms in both ovaries.  IMPRESSION:  No acute abnormality.  Similar appearance of minimally heterogeneous uterine myometrium which is nonspecific and may normal for this patient, or reflect small uterine fibroids, or potentially adenomyosis although this is thought less likely.  No sonographic evidence for ovarian torsion.   Original Report Authenticated By: Malachy Moan, M.D.   US Pelvis Complete  03/13/2013   *RADIOLOGY REPORT*  Clinical Data:  Right lower quadrant pain evaluate for ovarian torsion  TRANSABDOMINAL AND TRANSVAGINAL ULTRASOUND OF PELVIS DOPPLER ULTRASOUND OF OVARIES  Technique:  Both transabdominal and transvaginal ultrasound examinations of the pelvis were performed. Transabdominal technique was performed for global imaging of the pelvis including uterus, ovaries, adnexal regions, and pelvic cul-de-sac.  It was necessary to proceed with endovaginal exam following the transabdominal exam to visualize the uterus, endometrium and bilateral ovaries.  Color and duplex Doppler ultrasound was utilized to evaluate blood flow to the ovaries.  Comparison:  Prior pelvic ultrasound 09/12/2012; prior CT abdomen/pelvis also 09/12/2012  FINDINGS  Uterus:  The uterus is within normal limits for size at 9.2 x 4.6 x 5.4 cm.  The myometrium is slightly inhomogeneous as seen on the recent prior study.  No discrete mass or lesion. Nabothian cysts noted incidentally.  Endometrium:  Within normal limits at 5.4 mm.  Right ovary: Sonographically unremarkable.  3.3 x 1.8 x 2.7 cm. Normal follicular cysts.  Left ovary: Sonographically unremarkable  ovary measures 3.7 x 1.6 x 1.9 cm.  Normal follicular cysts.  Pulsed Doppler evaluation demonstrates normal low-resistance arterial and venous waveforms in both ovaries.  IMPRESSION:  No acute abnormality.  Similar appearance of minimally  heterogeneous uterine myometrium which is nonspecific and may normal for this patient, or reflect small uterine fibroids, or potentially adenomyosis although this is thought less likely.  No sonographic evidence for ovarian torsion.   Original Report Authenticated By: Malachy Moan, M.D.   Ct Abdomen Pelvis W Contrast  03/14/2013   *RADIOLOGY REPORT*  Clinical Data: Right lower quadrant abdominal pain for 2 days.  CT ABDOMEN AND PELVIS WITH CONTRAST  Technique:  Multidetector CT imaging of the abdomen and pelvis was performed following the standard protocol during bolus administration of intravenous contrast.  Contrast: OMNIPAQUE IOHEXOL 300 MG/ML  SOLN  Comparison: CT of the abdomen and pelvis performed 09/12/2012, and pelvic ultrasound performed 03/13/2013  Findings: The visualized lung bases are clear.  The liver and spleen are unremarkable in appearance.  The patient is status post cholecystectomy, with clips noted along the gallbladder fossa.  The pancreas and adrenal glands are unremarkable.  The kidneys are unremarkable in appearance.  There is no evidence of hydronephrosis.  No renal or ureteral stones are seen.  No perinephric stranding is appreciated.  No free fluid is identified.  The small bowel is unremarkable in appearance.  The stomach is within normal limits.  No acute vascular abnormalities are seen.  The appendix is normal in caliber, without evidence for appendicitis.  The colon is unremarkable in appearance.  The bladder is mildly distended and grossly unremarkable in appearance.  The uterus is within normal limits.  Bilateral tubal ligation clips are noted.  The ovaries are grossly unremarkable in appearance.  No suspicious adnexal masses are seen.  No inguinal lymphadenopathy is seen.  No acute osseous abnormalities are identified.  IMPRESSION: Unremarkable contrast enhanced CT of the abdomen and pelvis.   Original Report Authenticated By: Tonia Ghent, M.D.   [place] Art/ven Flow Abd  Pelv Doppler  03/13/2013   *RADIOLOGY REPORT*  Clinical Data:  Right lower quadrant pain evaluate for ovarian torsion  TRANSABDOMINAL AND TRANSVAGINAL ULTRASOUND OF PELVIS DOPPLER ULTRASOUND OF OVARIES  Technique:  Both transabdominal and transvaginal ultrasound examinations of the pelvis were performed. Transabdominal technique was performed for global imaging of the pelvis including uterus, ovaries, adnexal regions, and pelvic cul-de-sac.  It was necessary to proceed with endovaginal exam following the transabdominal exam to visualize the uterus, endometrium and bilateral ovaries.  Color and duplex Doppler ultrasound was utilized to evaluate blood flow to the ovaries.  Comparison:  Prior pelvic ultrasound 09/12/2012; prior CT abdomen/pelvis also 09/12/2012  FINDINGS  Uterus:  The uterus is within normal limits for size at 9.2 x 4.6 x 5.4 cm.  The myometrium is slightly inhomogeneous as seen on the recent prior study.  No discrete mass or lesion. Nabothian cysts noted incidentally.  Endometrium:  Within normal limits at 5.4 mm.  Right ovary: Sonographically unremarkable.  3.3 x 1.8 x 2.7 cm. Normal follicular cysts.  Left ovary: Sonographically unremarkable ovary measures 3.7 x 1.6 x 1.9 cm.  Normal follicular cysts.  Pulsed Doppler evaluation demonstrates normal low-resistance arterial and venous waveforms in both ovaries.  IMPRESSION:  No acute abnormality.  Similar appearance of minimally heterogeneous uterine myometrium which is nonspecific and may normal for this patient, or reflect small uterine fibroids, or potentially adenomyosis although this is thought less likely.  No sonographic  evidence for ovarian torsion.   Original Report Authenticated By: Malachy Moan, M.D.   Dg Abd Acute W/chest  03/13/2013   *RADIOLOGY REPORT*  Clinical Data: Initial encounter for current episode of right lower quadrant abdominal pain which began yesterday.  No fever.  Surgical history includes cholecystectomy and  bilateral tubal ligation.  ACUTE ABDOMEN SERIES (ABDOMEN 2 VIEW & CHEST 1 VIEW)  Comparison: Two-view chest x-ray 01/25/2013.  CT abdomen and pelvis 09/12/2012.  Findings: Bowel gas pattern unremarkable without evidence of obstruction or significant ileus.  No evidence of free air or significant air fluid levels on the erect image.  Moderate stool burden in the cecum and ascending colon and in the rectum. Food within the stomach.  Surgical clips in the right upper quadrant from prior cholecystectomy.  Bilateral tubal ligation clips in the pelvis.  Phlebolith low in the left side of the pelvis.  No visible opaque urinary tract calculi.  Regional skeleton unremarkable.  Cardiomediastinal silhouette unremarkable, unchanged.  Lungs clear. Bronchovascular markings normal.  Pulmonary vascularity normal.  No pneumothorax.  No pleural effusions.  IMPRESSION: No acute abdominal or pulmonary abnormality.   Original Report Authenticated By: Hulan Saas, M.D.    MDM   1. Abdominal pain   2. Nausea and vomiting   3. Constipation       8:40 PM patient seen and evaluated. Patient appears in mild-to-moderate discomfort is somewhat tearful. She does not appear in any acute distress. Patient reports having similar symptoms over the past several months intermittently with prior hospital valuations.   Initial lab testing shows slight elevated WBC otherwise unremarkable. Acute abdomen x-ray also unremarkable. We'll plan to perform pelvic examination to evaluate for possible gynecological source. Pain and antiemetic medications ordered.  Review of past medical charts show that patient underwent abdominal CT with contrast as well as a pelvic ultrasound in April. There was no acute findings at that time. Patient possibly had small uterine fibroids. No signs of appendicitis. No ovarian cysts or torsion.  Patient does have reproducible pain with pelvic. Will repeat ultrasound today.  Ultrasound unremarkable. Patient  still did complain of significant abdominal pains with nausea. Patient discussed with attending physician. Patient also now having episodes of vomiting. Initial medicines ordered. We'll plan to obtain CT scan to rule out other concerning pathology.   CT scan unremarkable for any acute process. Appendix is normal. At this time we'll discharge home with symptomatic treatment and referrals for followup.   Angus Seller, PA-C 03/14/13 [id number]

## 2013-03-14 ENCOUNTER — Emergency Department (HOSPITAL_COMMUNITY): Payer: No Typology Code available for payment source

## 2013-03-14 ENCOUNTER — Encounter (HOSPITAL_COMMUNITY): Payer: Self-pay | Admitting: Radiology

## 2013-03-14 LAB — URINE CULTURE: Colony Count: 7000

## 2013-03-14 LAB — GC/CHLAMYDIA PROBE AMP: CT Probe RNA: NEGATIVE

## 2013-03-14 IMAGING — CT CT ABD-PELV W/ CM
2 of 4 series · 17 of 46 positions shown, 19 images · IV contrast (APPLIED)
Comparison: CT of the abdomen and pelvis performed [DATE], and
pelvic ultrasound performed [DATE]

CLINICAL DATA: Right lower quadrant abdominal pain for 2 days.

CT ABDOMEN AND PELVIS WITH CONTRAST
TECHNIQUE: Multidetector CT imaging of the abdomen and pelvis was
performed following the standard protocol during bolus
administration of intravenous contrast.
Contrast: 100mL OMNIPAQUE IOHEXOL 300 MG/ML  SOLN

[Series 2: abd/ pelvis 5.0 i30f 1 · axial · 0.92mm/px · z∈[+738,+1163]mm · 14 of 93 slices shown, 16 images]
[im 4/93  soft-tissue]
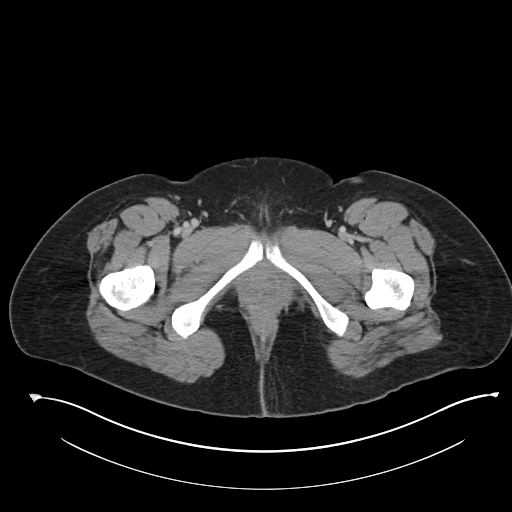
[im 4/93  bone]
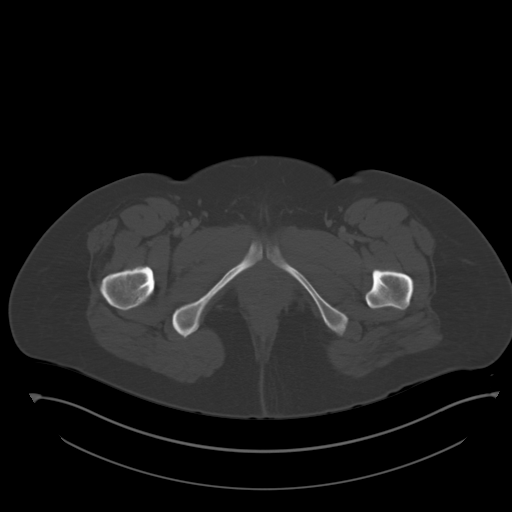
[im 12/93  soft-tissue]
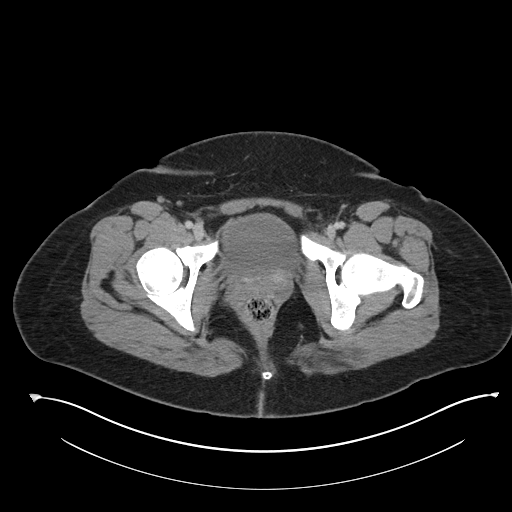
[im 20/93  soft-tissue]
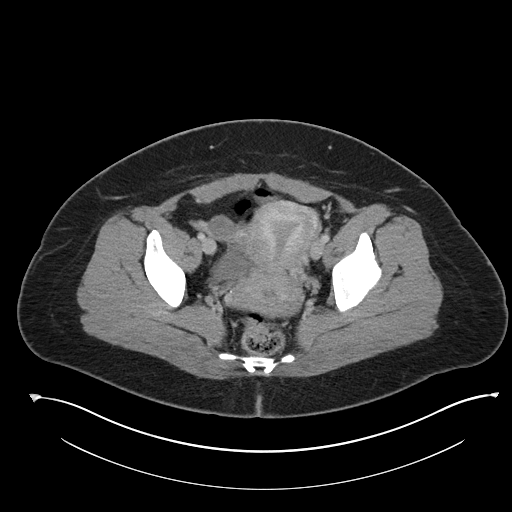
[im 24/93  soft-tissue]
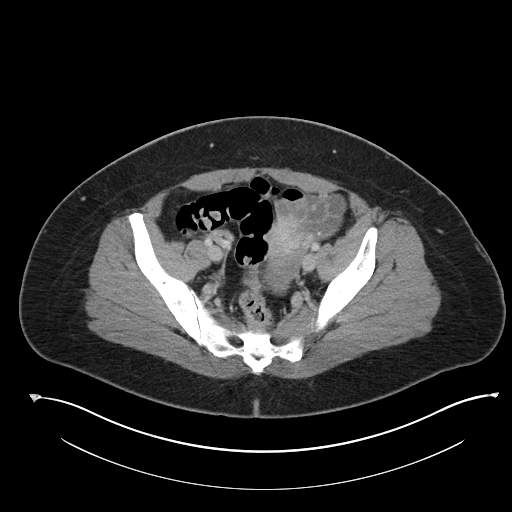
[im 31/93  soft-tissue]
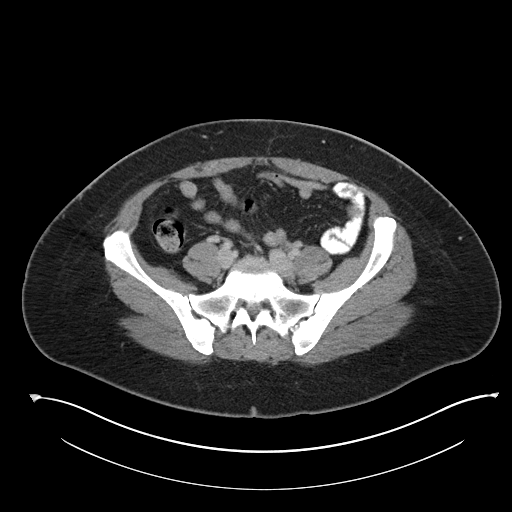
[im 39/93  soft-tissue]
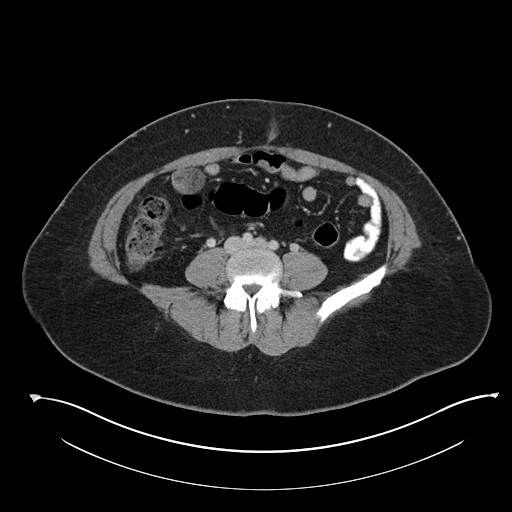
[im 43/93  soft-tissue]
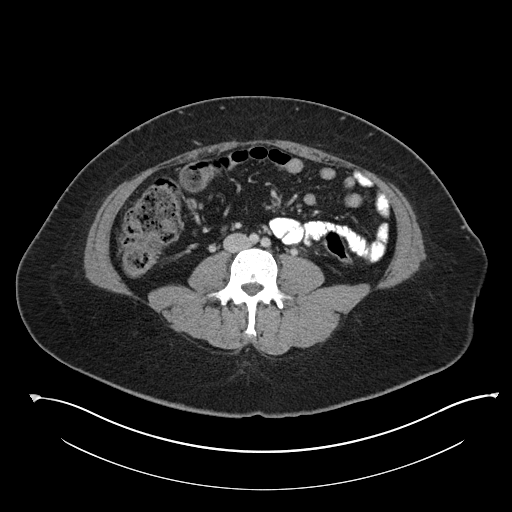
[im 50/93  soft-tissue]
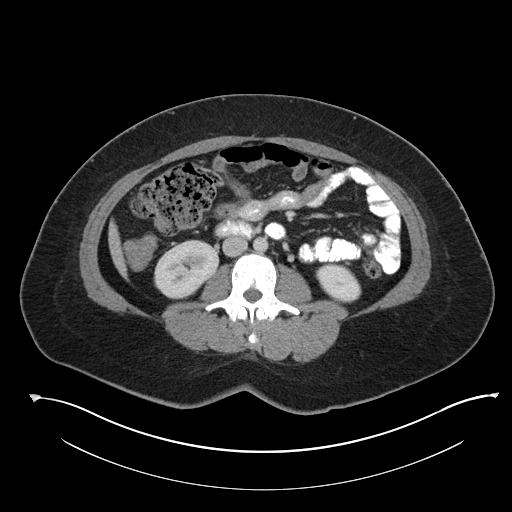
[im 54/93  soft-tissue]
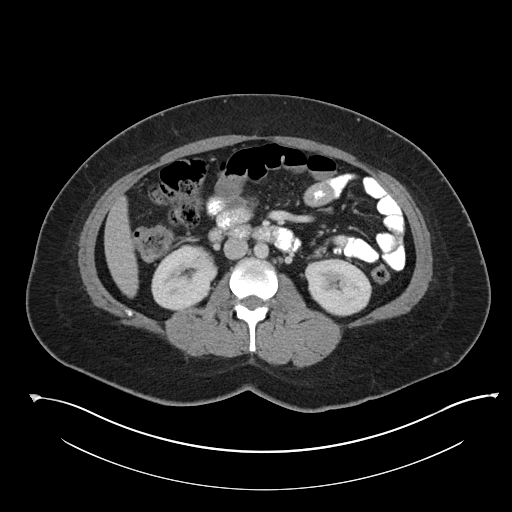
[im 54/93  bone]
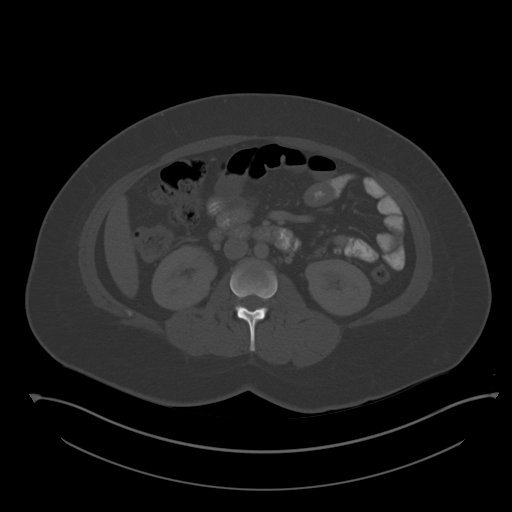
[im 62/93  soft-tissue]
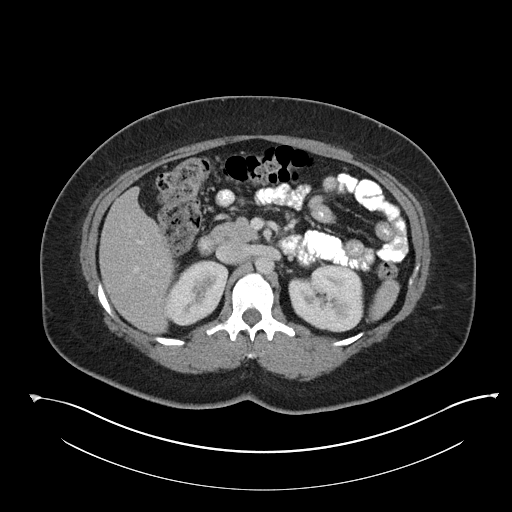
[im 70/93  soft-tissue]
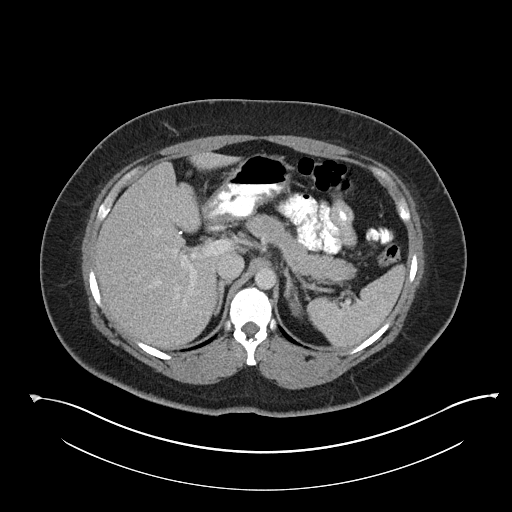
[im 73/93  soft-tissue]
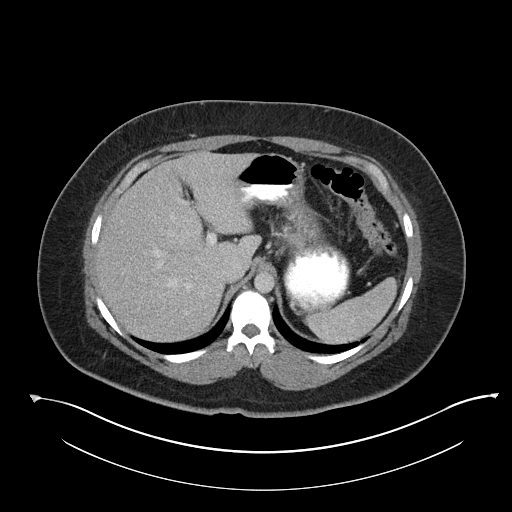
[im 81/93  soft-tissue]
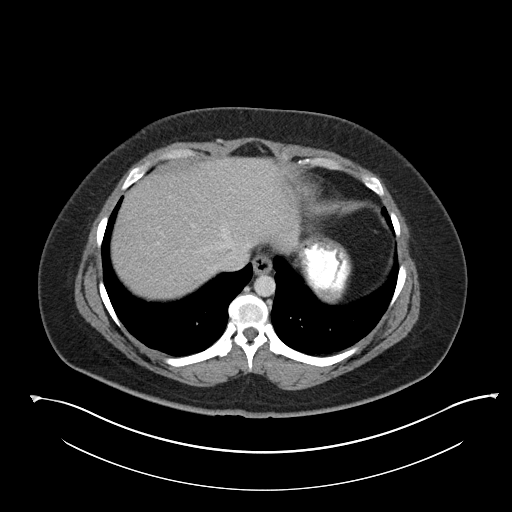
[im 89/93  soft-tissue]
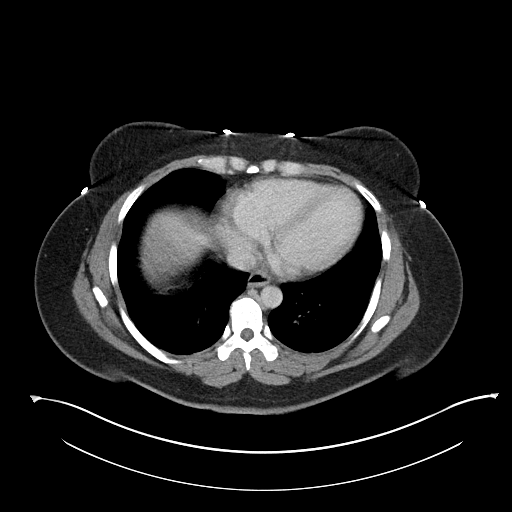

[Series 5: cororal soft tissue · coronal · 0.91mm/px · 3 of 101 slices shown]
[im 34/101  soft-tissue]
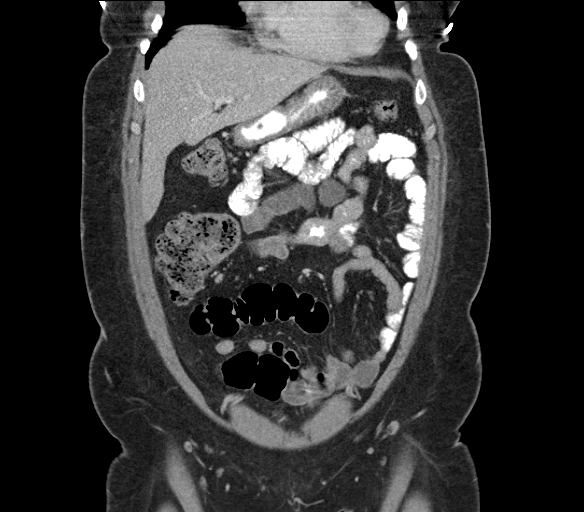
[im 45/101  soft-tissue]
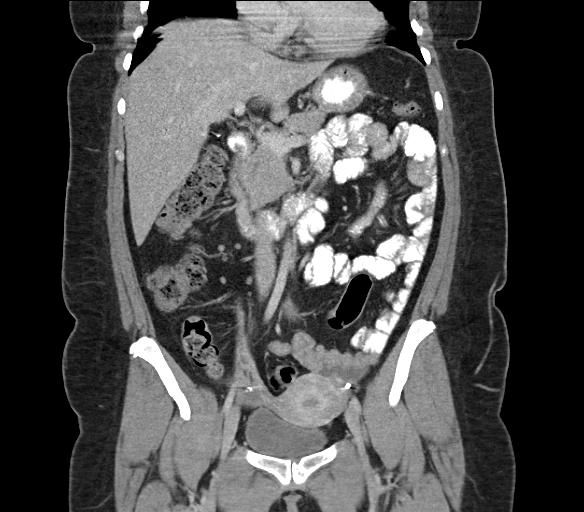
[im 56/101  soft-tissue]
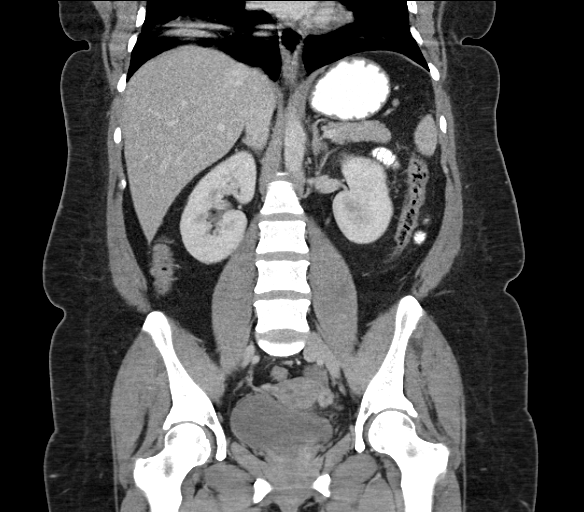

[17 of 46 positions shown; findings below may reference images not displayed]

FINDINGS: The visualized lung bases are clear.

The liver and spleen are unremarkable in appearance.  The patient
is status post cholecystectomy, with clips noted along the
gallbladder fossa.  The pancreas and adrenal glands are
unremarkable.

The kidneys are unremarkable in appearance.  There is no evidence
of hydronephrosis.  No renal or ureteral stones are seen.  No
perinephric stranding is appreciated.

No free fluid is identified.  The small bowel is unremarkable in
appearance.  The stomach is within normal limits.  No acute
vascular abnormalities are seen.

The appendix is normal in caliber, without evidence for
appendicitis.  The colon is unremarkable in appearance.

The bladder is mildly distended and grossly unremarkable in
appearance.  The uterus is within normal limits.  Bilateral tubal
ligation clips are noted.  The ovaries are grossly unremarkable in
appearance.  No suspicious adnexal masses are seen.  No inguinal
lymphadenopathy is seen.

No acute osseous abnormalities are identified.
IMPRESSION: Unremarkable contrast enhanced CT of the abdomen and pelvis.

## 2013-03-14 MED ORDER — PROMETHAZINE HCL 25 MG PO TABS: 25.0000 mg | ORAL_TABLET | Freq: Four times a day (QID) | ORAL | Status: DC | PRN

## 2013-03-14 MED ORDER — ONDANSETRON HCL 4 MG/2ML IJ SOLN
4.0000 mg | Freq: Once | INTRAMUSCULAR | Status: AC
  Administered 2013-03-14: 4 mg via INTRAVENOUS
  Filled 2013-03-14: qty 2

## 2013-03-14 MED ORDER — METOCLOPRAMIDE HCL 5 MG/ML IJ SOLN
10.0000 mg | Freq: Once | INTRAMUSCULAR | Status: AC
  Administered 2013-03-14: 10 mg via INTRAVENOUS
  Filled 2013-03-14: qty 2

## 2013-03-14 MED ORDER — ONDANSETRON 8 MG PO TBDP: ORAL_TABLET | ORAL | Status: DC

## 2013-03-14 MED ORDER — HYDROCODONE-ACETAMINOPHEN 5-325 MG PO TABS: 1.0000 | ORAL_TABLET | ORAL | Status: DC | PRN

## 2013-03-14 MED ORDER — IOHEXOL 300 MG/ML  SOLN
100.0000 mL | Freq: Once | INTRAMUSCULAR | Status: AC | PRN
  Administered 2013-03-14: 100 mL via INTRAVENOUS

## 2013-03-14 NOTE — ED Notes (Signed)
Pt in CT.

## 2013-03-14 NOTE — ED Notes (Signed)
Finished with PO contrast, CT notified.

## 2013-03-14 NOTE — ED Notes (Signed)
Pt sitting up in bed shaking.  Also having some dry heaves. sts feeling worse than before. Peter dammen aware.  New orders obtained.

## 2013-03-14 NOTE — ED Notes (Signed)
PA at bedside.

## 2013-03-14 NOTE — ED Notes (Addendum)
Pt alert, NAD, calm, interactive, to CT via stretcher, to go to POD A room 11 from CT.

## 2013-03-14 NOTE — ED Notes (Signed)
Report to Eme, rn

## 2013-03-15 NOTE — ED Provider Notes (Addendum)
Medical screening examination/treatment/procedure(s) were conducted as a shared visit with non-physician practitioner(s) and myself.  I personally evaluated the patient during the encounter  65F here with N/V, abdominal pain. Has recurrent episodes similar to this about every six months. Prior scans normal. Persistent N/V here, mild leukocytosis, will CT scan. Belly soft with some lower abdominal tenderness. No rebound or guarding. Meds given for symptom control.  1. Abdominal pain   2. Nausea and vomiting   3. Constipation      Dagmar Hait, MD 03/15/13 [id number]

## 2013-03-16 ENCOUNTER — Emergency Department (INDEPENDENT_AMBULATORY_CARE_PROVIDER_SITE_OTHER)
Admission: EM | Admit: 2013-03-16 | Discharge: 2013-03-16 | Disposition: A | Discharge status: Home / Self Care | Payer: No Typology Code available for payment source | Source: Home / Self Care

## 2013-03-16 ENCOUNTER — Encounter (HOSPITAL_COMMUNITY): Payer: Self-pay | Admitting: Emergency Medicine

## 2013-03-16 DIAGNOSIS — L0291 Cutaneous abscess, unspecified: Secondary | ICD-10-CM

## 2013-03-16 MED ORDER — SULFAMETHOXAZOLE-TRIMETHOPRIM 800-160 MG PO TABS: 1.0000 | ORAL_TABLET | Freq: Two times a day (BID) | ORAL | Status: DC

## 2013-03-16 NOTE — ED Provider Notes (Signed)
CSN: [id number]     Arrival date & time 03/16/13  1011 History   First MD Initiated Contact with Patient 03/16/13 1105     Chief Complaint  Patient presents with  . Rash   (Consider location/radiation/quality/duration/timing/severity/associated sxs/prior Treatment) HPI Comments: 37 year old Spanish female presents with a tender red itchy papule to the left upper arm. She first noticed it 3 days ago. Started out as a very small spot surrounded with a small area of erythema and itchiness. Over the past couple days he developed a rather large papule measuring approximately 5 mm across. The erythematous has increased in area as well. It is now tender with underlying induration. No lymphangitis.   Past Medical History  Diagnosis Date  . History of bronchitis     "I've stayed in hospital 2 X w/bronchitis"  . Asthma   . History of blood transfusion 1999    w/vaginal delivery   Past Surgical History  Procedure Laterality Date  . Cholecystectomy  2011  . Cesarean section  1997; 2008  . Tubal ligation  04/2007   Family History  Problem Relation Age of Onset  . Diabetes Mother   . Osteoarthritis Mother   . Hypertension Mother    History  Substance Use Topics  . Smoking status: Never Smoker   . Smokeless tobacco: Never Used  . Alcohol Use: No   OB History   Grav Para Term Preterm Abortions TAB SAB Ect Mult Living                 Review of Systems  Constitutional: Negative.   Skin: Negative for rash.  All other systems reviewed and are negative.    Allergies  Review of patient's allergies indicates no known allergies.  Home Medications   Current Outpatient Rx  Name  Route  Sig  Dispense  Refill  . albuterol (PROVENTIL,VENTOLIN) 90 MCG/ACT inhaler   Inhalation   Inhale 2 puffs into the lungs 2 (two) times daily as needed for wheezing or shortness of breath. For wheeze or shortness of breath         . HYDROcodone-acetaminophen (NORCO/VICODIN) 5-325 MG per tablet    Oral   Take 1-2 tablets by mouth every 4 (four) hours as needed for pain.   20 tablet   0   . ibuprofen (ADVIL,MOTRIN) 200 MG tablet   Oral   Take 800 mg by mouth every 6 (six) hours as needed for pain.          Marland Kitchen ondansetron (ZOFRAN ODT) 8 MG disintegrating tablet      8mg  ODT q4 hours prn nausea   20 tablet   0   . promethazine (PHENERGAN) 25 MG tablet   Oral   Take 1 tablet (25 mg total) by mouth every 6 (six) hours as needed for nausea.   20 tablet   0   . sulfamethoxazole-trimethoprim (SEPTRA DS) 800-160 MG per tablet   Oral   Take 1 tablet by mouth 2 (two) times daily. X 5 days   14 tablet   0    BP 116/68  Pulse 90  Temp(Src) 97.8 F (36.6 C) (Oral)  Resp 18  SpO2 100%  LMP 03/02/2013 Physical Exam  Nursing note and vitals reviewed. Constitutional: She is oriented to person, place, and time. She appears well-developed and well-nourished. No distress.  Pulmonary/Chest: Effort normal.  Neurological: She is alert and oriented to person, place, and time. She exhibits normal muscle tone.  Skin: Skin is warm and dry.  Left upper arm medial aspect with a 5 mm papule over underlying induration and an area of erythema approximate 7 cm diameter.  Psychiatric: She has a normal mood and affect.    ED Course  INCISION AND DRAINAGE Date/Time: 03/16/2013 11:30 AM Performed by: Phineas Real, Torien Ramroop Authorized by: Clementeen Graham, S Consent: Verbal consent obtained. Risks and benefits: risks, benefits and alternatives were discussed Consent given by: patient Patient understanding: patient states understanding of the procedure being performed Patient identity confirmed: verbally with patient Type: abscess Body area: upper extremity Location details: left arm Anesthesia: local infiltration Local anesthetic: lidocaine 2% without epinephrine Anesthetic total: 2 ml Patient sedated: no Scalpel size: 11 Incision type: single with marsupialization Complexity: simple Drainage:  bloody Drainage amount: scant Wound treatment: drain placed Packing material: 1/4 in gauze   (including critical care time) Labs Review Labs Reviewed - No data to display Imaging Review No results found.    MDM   1. Abscess and cellulitis      This lesion appears to have been some sort of insect bite or sting which became infected. There was initial redness and itching but then developed a small abscess with cellulitis.  Treated with I&D Apply warm compresses Septra DS 1 twice a day Remove the packing tomorrow Return to the ER for any signs of worsening new symptoms or problems. This wound is rather superficial and approximately 1/2 inch of packing was inserted. The lesion above the scan was approximately 5 mm across. Culture obtained and sent to lab.  Hayden Rasmussen, NP 03/16/13 1147  Hayden Rasmussen, NP 03/16/13 1148

## 2013-03-16 NOTE — ED Notes (Signed)
Inside of left arm there is a red bump, redness around bump.  Bump does itch.  Onset Monday of symptoms

## 2013-03-17 NOTE — ED Provider Notes (Signed)
Medical screening examination/treatment/procedure(s) were performed by a resident physician or non-physician practitioner and as the supervising physician I was immediately available for consultation/collaboration.  Inocencia Murtaugh, MD    Malyn Aytes S Haniel Fix, MD 03/17/13 0735 

## 2013-03-19 LAB — CULTURE, ROUTINE-ABSCESS

## 2013-04-04 ENCOUNTER — Ambulatory Visit: Payer: No Typology Code available for payment source

## 2013-07-06 ENCOUNTER — Encounter (HOSPITAL_COMMUNITY): Payer: Self-pay | Admitting: Emergency Medicine

## 2013-07-06 ENCOUNTER — Emergency Department (HOSPITAL_COMMUNITY)
Admission: EM | Admit: 2013-07-06 | Discharge: 2013-07-06 | Disposition: A | Discharge status: Home / Self Care | Payer: PRIVATE HEALTH INSURANCE | Source: Home / Self Care

## 2013-07-06 DIAGNOSIS — G44209 Tension-type headache, unspecified, not intractable: Secondary | ICD-10-CM

## 2013-07-06 MED ORDER — BUTALBITAL-APAP-CAFFEINE 50-325-40 MG PO TABS: 1.0000 | ORAL_TABLET | Freq: Four times a day (QID) | ORAL | Status: DC | PRN

## 2013-07-06 MED ORDER — KETOROLAC TROMETHAMINE 60 MG/2ML IM SOLN
60.0000 mg | Freq: Once | INTRAMUSCULAR | Status: AC
  Administered 2013-07-06: 60 mg via INTRAMUSCULAR

## 2013-07-06 MED ORDER — KETOROLAC TROMETHAMINE 60 MG/2ML IM SOLN
INTRAMUSCULAR | Status: AC
  Filled 2013-07-06: qty 2

## 2013-07-06 MED ORDER — DEXAMETHASONE SODIUM PHOSPHATE 10 MG/ML IJ SOLN
INTRAMUSCULAR | Status: AC
  Filled 2013-07-06: qty 1

## 2013-07-06 MED ORDER — ONDANSETRON HCL 4 MG PO TABS: 4.0000 mg | ORAL_TABLET | Freq: Four times a day (QID) | ORAL | Status: DC

## 2013-07-06 MED ORDER — DEXAMETHASONE SODIUM PHOSPHATE 10 MG/ML IJ SOLN
10.0000 mg | Freq: Once | INTRAMUSCULAR | Status: AC
  Administered 2013-07-06: 10 mg via INTRAMUSCULAR

## 2013-07-06 NOTE — Discharge Instructions (Signed)
Dolor de cabeza por tensin  (Tension Headache)  Una cefalea tensional es un dolor o presin que se siente en la frente y los lados de la cabeza. Las cefaleas tensionales suelen aparecer despus de una situacin de estrs, preocupacin (ansiedad) o por sentirse triste o cado por Ambulance person (deprimido). CUIDADOS EN EL HOGAR   Slo tome los medicamentos segn le indique el mdico.  Cuando sienta dolor de cabeza acustese en un cuarto oscuro y tranquilo.  Lleve un registro diario para averiguar si ciertas cosas provocan dolores de cabeza. Por ejemplo, escriba:  Lo que usted come y bebe.  Cunto tiempo duerme.  Algn cambio en su dieta o medicamentos.  Reljese recibiendo masajes o haga otras actividades relajantes.  Coloque hielo o calor en la cabeza y el cuello como lo indique su mdico.  DISMINUYA EL NIVEL DE ESTRS  Sintase con la espalda recta. No apriete los msculos (tensione).  Si fuma, deje de hacerlo.  Beba menos alcohol.  Consuma menos o deje de tomar cafena.  Coma y haga ejercicios regularmente.  Duerma lo suficiente.  Evite usar mucha medicacin para Conservation officer, historic buildings. SOLICITE AYUDA DE INMEDIATO SI:   El dolor de [place].  Tiene fiebre.  Presenta rigidez en el cuello.  Tiene dificultad para ver.  Sus msculos estn dbiles, o pierde el control muscular.  Pierde el equilibrio o tiene problemas para Writer.  Siente que se desvanece (dbil) o se desmaya.  Tiene malos sntomas que son diferentes a los primeros sntomas.  Tiene problemas con los medicamentos que le haya dado su mdico.  El medicamento no le hace [place].  Siente que Conservation officer, historic buildings de cabeza es diferente a los otros dolores de [place].  Tiene malestar estomacal (nuseas) o(vmitos). ASEGRESE DE QUE:   Comprende estas instrucciones.  Controlar su enfermedad.  Solicitar ayuda de inmediato si no mejora o si empeora. Document Released: 08/17/2011 [facility] Patient Information 2014  [place].

## 2013-07-06 NOTE — ED Provider Notes (Signed)
CSN: [id number]     Arrival date & time 07/06/13  1150 History   First MD Initiated Contact with Patient 07/06/13 1331     Chief Complaint  Patient presents with  . Headache   (Consider location/radiation/quality/duration/timing/severity/associated sxs/prior Treatment) HPI Comments: 38 year old female presents with a four-day history of headache. The headache is constant it is located at the parietal temporal and frontal areas. The pain goes down the sides of her neck into the trapezii. She occasionally has nausea and blurry vision which was primarily yesterday. Denies problems with speech hearing swallowing or vision today. Yesterday she had left-sided weakness with some tingling today. Occasionally like bother her eyes. She has never had a headache like this before. She has taken OTC medication without relief.   Past Medical History  Diagnosis Date  . History of bronchitis     "I've stayed in hospital 2 X w/bronchitis"  . Asthma   . History of blood transfusion 1999    w/vaginal delivery   Past Surgical History  Procedure Laterality Date  . Cholecystectomy  2011  . Cesarean section  1997; 2008  . Tubal ligation  04/2007   Family History  Problem Relation Age of Onset  . Diabetes Mother   . Osteoarthritis Mother   . Hypertension Mother    History  Substance Use Topics  . Smoking status: Never Smoker   . Smokeless tobacco: Never Used  . Alcohol Use: No   OB History   Grav Para Term Preterm Abortions TAB SAB Ect Mult Living                 Review of Systems  Constitutional: Positive for activity change and appetite change. Negative for fever.  Eyes: Negative for pain, discharge, itching and visual disturbance.  Respiratory: Negative.   Cardiovascular: Negative.   Gastrointestinal: Negative.   Genitourinary: Negative.   Musculoskeletal: Positive for myalgias and neck pain.  Neurological: Positive for dizziness and headaches. Negative for tremors, seizures, syncope,  speech difficulty and weakness.  Psychiatric/Behavioral: Negative.     Allergies  Review of patient's allergies indicates no known allergies.  Home Medications   Current Outpatient Rx  Name  Route  Sig  Dispense  Refill  . albuterol (PROVENTIL,VENTOLIN) 90 MCG/ACT inhaler   Inhalation   Inhale 2 puffs into the lungs 2 (two) times daily as needed for wheezing or shortness of breath. For wheeze or shortness of breath         . butalbital-acetaminophen-caffeine (FIORICET) 50-325-40 MG per tablet   Oral   Take 1-2 tablets by mouth every 6 (six) hours as needed for headache.   8 tablet   0   . HYDROcodone-acetaminophen (NORCO/VICODIN) 5-325 MG per tablet   Oral   Take 1-2 tablets by mouth every 4 (four) hours as needed for pain.   20 tablet   0   . ibuprofen (ADVIL,MOTRIN) 200 MG tablet   Oral   Take 800 mg by mouth every 6 (six) hours as needed for pain.          Marland Kitchen ondansetron (ZOFRAN ODT) 8 MG disintegrating tablet      8mg  ODT q4 hours prn nausea   20 tablet   0   . ondansetron (ZOFRAN) 4 MG tablet   Oral   Take 1 tablet (4 mg total) by mouth every 6 (six) hours. Prn nausea   12 tablet   0   . promethazine (PHENERGAN) 25 MG tablet   Oral   Take 1  tablet (25 mg total) by mouth every 6 (six) hours as needed for nausea.   20 tablet   0   . sulfamethoxazole-trimethoprim (SEPTRA DS) 800-160 MG per tablet   Oral   Take 1 tablet by mouth 2 (two) times daily. X 5 days   14 tablet   0    BP 118/73  Pulse 70  Temp(Src) 97.8 F (36.6 C) (Oral)  Resp 18  SpO2 100%  LMP 06/27/2013 Physical Exam  Nursing note and vitals reviewed. Constitutional: She is oriented to person, place, and time. She appears well-developed and well-nourished. No distress.  HENT:  Head: Normocephalic and atraumatic.  Mouth/Throat: Oropharynx is clear and moist. No oropharyngeal exudate.  Tenderness over the parietal and frontal scalp. Tenderness over the occipital scalp to include the  posterior neck musculature and the bilateral trapezii.  Eyes: Conjunctivae and EOM are normal. Pupils are equal, round, and reactive to light.  Neck: Normal range of motion. Neck supple.  Cardiovascular: Normal rate and normal heart sounds.   Pulmonary/Chest: Effort normal and breath sounds normal. No respiratory distress.  Abdominal: Soft. There is no tenderness.  Musculoskeletal: Normal range of motion.  Lymphadenopathy:    She has no cervical adenopathy.  Neurological: She is alert and oriented to person, place, and time. She has normal strength. No cranial nerve deficit or sensory deficit. She exhibits normal muscle tone. She displays a negative Romberg sign. Coordination and gait normal.  Skin: Skin is warm and dry.  Psychiatric: She has a normal mood and affect.    ED Course  Procedures (including critical care time) Labs Review Labs Reviewed - No data to display Imaging Review No results found.    MDM   1. Muscle tension headache     Toradol 60 mg IM and Decadron 10 mg IM Rx for Zofran when necessary nausea Rx for Fioricet 1 every 6 hours when necessary headache #8 tablets only. Followup with your PCP next week Neurologic exam is unremarkable no deficits found on exam today. History was obtained through the telephone interpreter.    Janne Napoleon, NP 07/06/13 1358

## 2013-07-06 NOTE — ED Notes (Signed)
C/o head pain 

## 2013-07-06 NOTE — ED Provider Notes (Signed)
Medical screening examination/treatment/procedure(s) were performed by non-physician practitioner and as supervising physician I was immediately available for consultation/collaboration.  Philipp Deputy, M.D.   Harden Mo, MD 07/06/13 [phone]

## 2013-07-17 ENCOUNTER — Ambulatory Visit: Payer: No Typology Code available for payment source | Attending: Internal Medicine

## 2013-07-28 ENCOUNTER — Ambulatory Visit: Payer: Self-pay | Admitting: Internal Medicine

## 2013-07-31 ENCOUNTER — Ambulatory Visit: Payer: No Typology Code available for payment source | Attending: Internal Medicine | Admitting: Internal Medicine

## 2013-07-31 ENCOUNTER — Encounter: Payer: Self-pay | Admitting: Internal Medicine

## 2013-07-31 VITALS — BP 131/85 | HR 89 | Temp 97.9°F | Resp 16 | Wt 201.2 lb

## 2013-07-31 DIAGNOSIS — Z8249 Family history of ischemic heart disease and other diseases of the circulatory system: Secondary | ICD-10-CM | POA: Insufficient documentation

## 2013-07-31 DIAGNOSIS — Z23 Encounter for immunization: Secondary | ICD-10-CM

## 2013-07-31 DIAGNOSIS — M62838 Other muscle spasm: Secondary | ICD-10-CM | POA: Insufficient documentation

## 2013-07-31 DIAGNOSIS — Z79899 Other long term (current) drug therapy: Secondary | ICD-10-CM | POA: Insufficient documentation

## 2013-07-31 DIAGNOSIS — M542 Cervicalgia: Secondary | ICD-10-CM | POA: Insufficient documentation

## 2013-07-31 DIAGNOSIS — Z833 Family history of diabetes mellitus: Secondary | ICD-10-CM | POA: Insufficient documentation

## 2013-07-31 DIAGNOSIS — G44209 Tension-type headache, unspecified, not intractable: Secondary | ICD-10-CM

## 2013-07-31 DIAGNOSIS — K029 Dental caries, unspecified: Secondary | ICD-10-CM

## 2013-07-31 DIAGNOSIS — J45909 Unspecified asthma, uncomplicated: Secondary | ICD-10-CM | POA: Insufficient documentation

## 2013-07-31 MED ORDER — CYCLOBENZAPRINE HCL 10 MG PO TABS: 10.0000 mg | ORAL_TABLET | Freq: Every day | ORAL | Status: DC

## 2013-07-31 MED ORDER — BUTALBITAL-APAP-CAFFEINE 50-325-40 MG PO TABS: 1.0000 | ORAL_TABLET | Freq: Four times a day (QID) | ORAL | Status: DC | PRN

## 2013-07-31 NOTE — Progress Notes (Signed)
Patient her for follow up from hospital Was seen for pain and swelling to back of neck Diagnosed with stress

## 2013-07-31 NOTE — Progress Notes (Signed)
MRN: [id number] Name: Alexandra Norris  Sex: female Age: 38 y.o. DOB: 14-Nov-1975  Allergies: Review of patient's allergies indicates no known allergies.  Chief Complaint  Patient presents with  . Hospitalization Follow-up    HPI: Patient is 38 y.o. female who comes today for followup, recently went to the emergency room with symptoms of headache/neck pain , EMR reviewed patient was diagnosed with muscle tension headache as patient had been under a lot of stress recently, she was prescribed  Fioricet which patient ran out of reports improvement with the medication denies any numbness weakness. Patient also has l referral to see a dentist.ot of dental cavities is requesting to see a dentist.   Past Medical History  Diagnosis Date  . History of bronchitis     "I've stayed in hospital 2 X w/bronchitis"  . Asthma   . History of blood transfusion 1999    w/vaginal delivery    Past Surgical History  Procedure Laterality Date  . Cholecystectomy  2011  . Cesarean section  1997; 2008  . Tubal ligation  04/2007      Medication List       This list is accurate as of: 07/31/13 10:00 AM.  Always use your most recent med list.               albuterol 90 MCG/ACT inhaler  Commonly known as:  PROVENTIL,VENTOLIN  Inhale 2 puffs into the lungs 2 (two) times daily as needed for wheezing or shortness of breath. For wheeze or shortness of breath     butalbital-acetaminophen-caffeine 50-325-40 MG per tablet  Commonly known as:  FIORICET  Take 1-2 tablets by mouth every 6 (six) hours as needed for headache.     cyclobenzaprine 10 MG tablet  Commonly known as:  FLEXERIL  Take 1 tablet (10 mg total) by mouth at bedtime.     HYDROcodone-acetaminophen 5-325 MG per tablet  Commonly known as:  NORCO/VICODIN  Take 1-2 tablets by mouth every 4 (four) hours as needed for pain.     ibuprofen 200 MG tablet  Commonly known as:  ADVIL,MOTRIN  Take 800 mg by mouth every 6 (six) hours as  needed for pain.     ondansetron 4 MG tablet  Commonly known as:  ZOFRAN  Take 1 tablet (4 mg total) by mouth every 6 (six) hours. Prn nausea     ondansetron 8 MG disintegrating tablet  Commonly known as:  ZOFRAN ODT  8mg  ODT q4 hours prn nausea     promethazine 25 MG tablet  Commonly known as:  PHENERGAN  Take 1 tablet (25 mg total) by mouth every 6 (six) hours as needed for nausea.     sulfamethoxazole-trimethoprim 800-160 MG per tablet  Commonly known as:  SEPTRA DS  Take 1 tablet by mouth 2 (two) times daily. X 5 days        Meds ordered this encounter  Medications  . butalbital-acetaminophen-caffeine (FIORICET) 50-325-40 MG per tablet    Sig: Take 1-2 tablets by mouth every 6 (six) hours as needed for headache.    Dispense:  30 tablet    Refill:  0    Order Specific Question:  Supervising Provider    Answer:  Jake Michaelis, DAVID C [id number]  . cyclobenzaprine (FLEXERIL) 10 MG tablet    Sig: Take 1 tablet (10 mg total) by mouth at bedtime.    Dispense:  30 tablet    Refill:  1    Immunization History  Administered Date(s)  Administered  . Influenza Whole 03/14/2007, 05/07/2010  . Td 11/06/2004    Family History  Problem Relation Age of Onset  . Diabetes Mother   . Osteoarthritis Mother   . Hypertension Mother     History  Substance Use Topics  . Smoking status: Never Smoker   . Smokeless tobacco: Never Used  . Alcohol Use: No    Review of Systems   As noted in HPI  Filed Vitals:   07/31/13 0940  BP: 131/85  Pulse: 89  Temp: 97.9 F (36.6 C)  Resp: 16    Physical Exam  Physical Exam  HENT:  Dental cavities   Eyes: EOM are normal. Pupils are equal, round, and reactive to light.  Neck:  Some tenderness on back of neck and trapezius muscles    Cardiovascular: Normal rate and regular rhythm.   Pulmonary/Chest: Breath sounds normal. No respiratory distress. She has no wheezes. She has no rales.  Neurological:  Equal strength in all extremities      CBC    Component Value Date/Time   WBC 11.7* 03/13/2013 1728   RBC 4.87 03/13/2013 1728   HGB 13.6 03/13/2013 1728   HCT 40.2 03/13/2013 1728   PLT 293 03/13/2013 1728   MCV 82.5 03/13/2013 1728   LYMPHSABS 3.0 03/13/2013 1728   MONOABS 0.7 03/13/2013 1728   EOSABS 0.3 03/13/2013 1728   BASOSABS 0.0 03/13/2013 1728    CMP     Component Value Date/Time   NA 140 03/13/2013 1728   K 4.0 03/13/2013 1728   CL 105 03/13/2013 1728   CO2 21 03/13/2013 1728   GLUCOSE 126* 03/13/2013 1728   BUN 12 03/13/2013 1728   CREATININE 0.46* 03/13/2013 1728   CREATININE 0.52 01/03/2013 1603   CALCIUM 9.0 03/13/2013 1728   PROT 8.0 03/13/2013 1728   ALBUMIN 3.9 03/13/2013 1728   AST 19 03/13/2013 1728   ALT 16 03/13/2013 1728   ALKPHOS 111 03/13/2013 1728   BILITOT 0.4 03/13/2013 1728   GFRNONAA >90 03/13/2013 1728   GFRAA >90 03/13/2013 1728    Lab Results  Component Value Date/Time   CHOL 143 01/03/2013  4:03 PM    No components found with this basename: hga1c    Lab Results  Component Value Date/Time   AST 19 03/13/2013  5:28 PM    Assessment and Plan  Muscle tension headache - Plan: butalbital-acetaminophen-caffeine (FIORICET) 50-325-40 MG per tablet when necessary for pain  Muscle spasm - Plan: cyclobenzaprine (FLEXERIL) 10 MG tablet each bedtime.  Dental cavities - Plan: Ambulatory referral to Dentistry   Return if symptoms worsen or fail to improve.  Lorayne Marek, MD

## 2013-08-23 ENCOUNTER — Other Ambulatory Visit: Payer: Self-pay

## 2013-08-29 ENCOUNTER — Other Ambulatory Visit: Payer: Self-pay

## 2013-09-20 ENCOUNTER — Encounter (HOSPITAL_COMMUNITY): Payer: Self-pay | Admitting: Emergency Medicine

## 2013-09-20 ENCOUNTER — Emergency Department (INDEPENDENT_AMBULATORY_CARE_PROVIDER_SITE_OTHER): Payer: No Typology Code available for payment source

## 2013-09-20 ENCOUNTER — Emergency Department (HOSPITAL_COMMUNITY)
Admission: EM | Admit: 2013-09-20 | Discharge: 2013-09-20 | Disposition: A | Discharge status: Home / Self Care | Payer: No Typology Code available for payment source | Source: Home / Self Care | Attending: Emergency Medicine | Admitting: Emergency Medicine

## 2013-09-20 DIAGNOSIS — S6390XA Sprain of unspecified part of unspecified wrist and hand, initial encounter: Secondary | ICD-10-CM

## 2013-09-20 DIAGNOSIS — W219XXA Striking against or struck by unspecified sports equipment, initial encounter: Secondary | ICD-10-CM

## 2013-09-20 DIAGNOSIS — Y9368 Activity, volleyball (beach) (court): Secondary | ICD-10-CM

## 2013-09-20 DIAGNOSIS — S63616A Unspecified sprain of right little finger, initial encounter: Secondary | ICD-10-CM

## 2013-09-20 DIAGNOSIS — S63617A Unspecified sprain of left little finger, initial encounter: Secondary | ICD-10-CM

## 2013-09-20 IMAGING — CR DG FINGER LITTLE 2+V*L*
1 series · 1 of 1 positions shown · non-contrast
Comparison: None.

CLINICAL DATA: Injury.

EXAM:
LEFT LITTLE FINGER 2+V

[view not recorded]
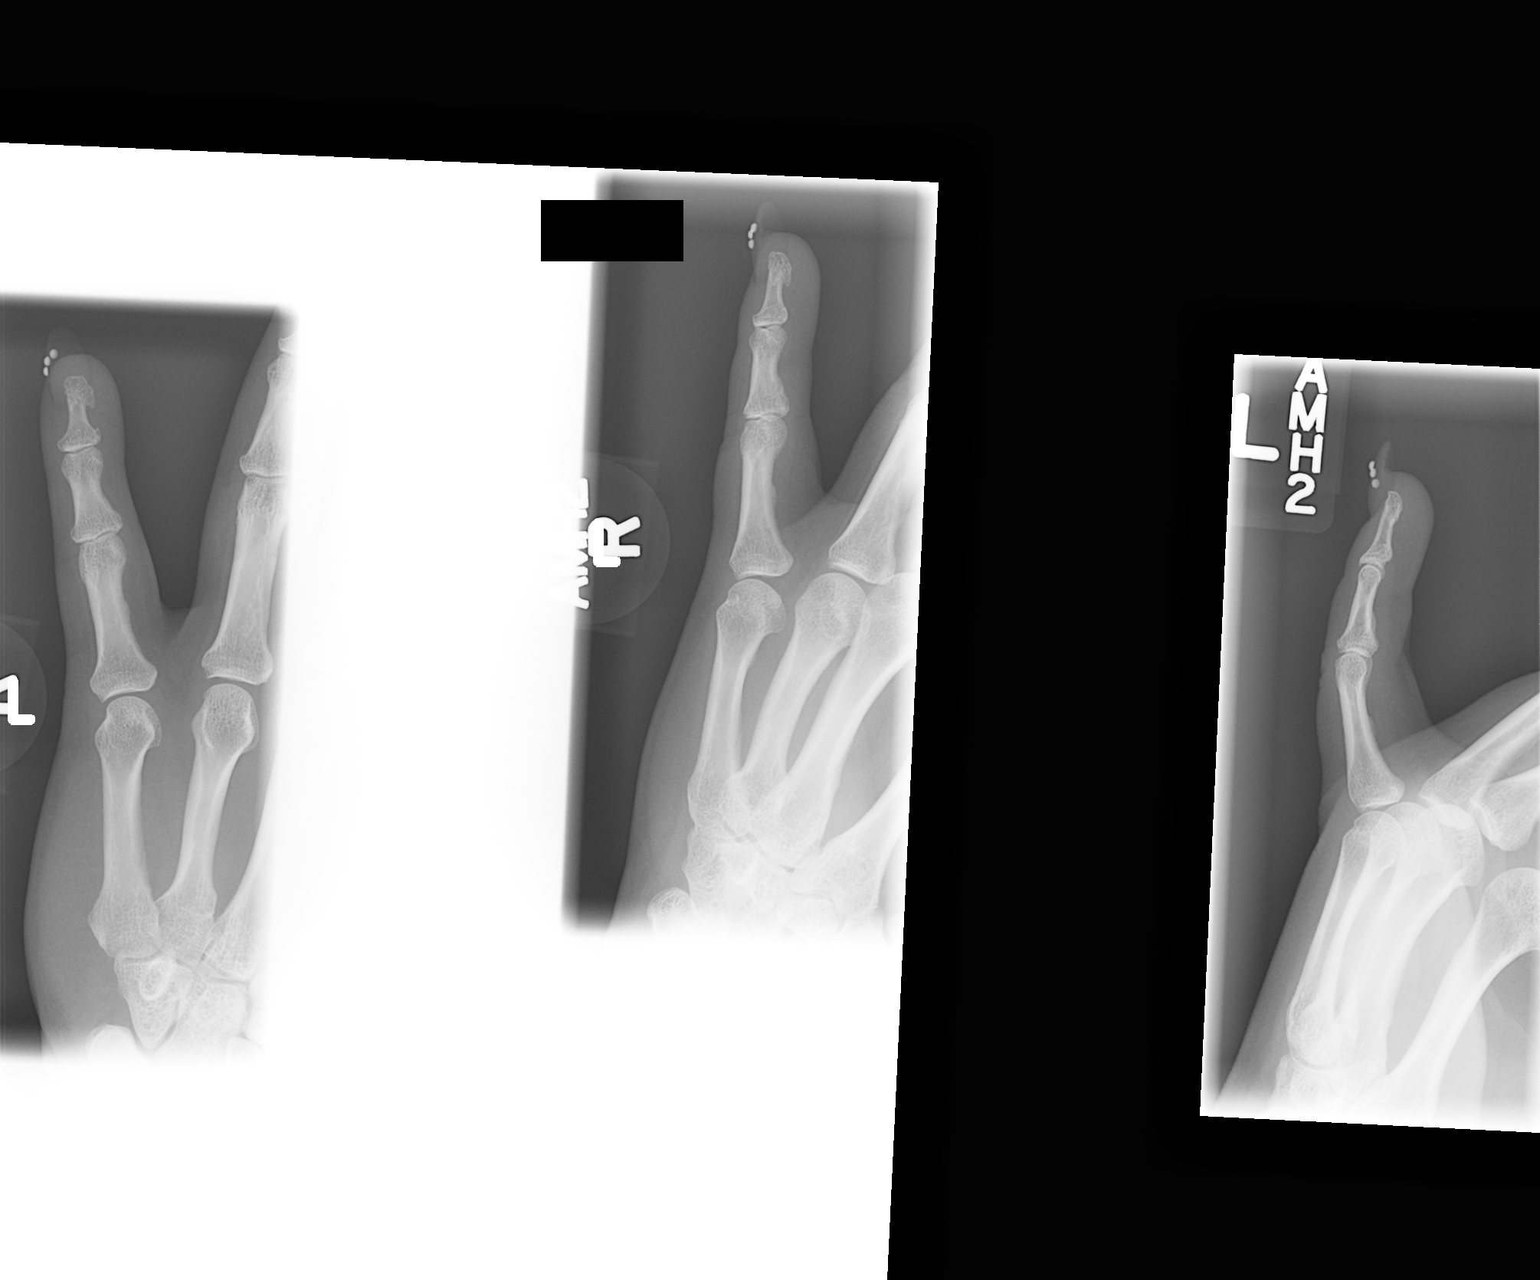

[1 of 1 positions shown; findings below may reference images not displayed]

FINDINGS: There is no evidence of fracture or dislocation. There is no
evidence of arthropathy or other focal bone abnormality. Soft
tissues are unremarkable.
IMPRESSION: No acute fracture or dislocation.

## 2013-09-20 NOTE — Discharge Instructions (Signed)
You have no broken bone or dislocations in either of your small fingers. Ibuprofen as directed on packaging for pain. Ice to reduce swelling. Activity as tolerated.   Esguince de dedo  Sport and exercise psychologist)  Un esguince de dedo es un desgarro en uno de los tejidos fuertes y fibrosos (ligamentos) que conectan los huesos en el dedo. La gravedad del esguince depende de la cantidad de ligamento que se rompa. La ruptura puede ser parcial o completa.  CAUSAS  A menudo, los esguinces son el resultado de una cada o de un accidente. Si extiende las manos para tomar un objeto o para protegerse, la fuerza del impacto hace que las fibras del ligamento se estiren demasiado. Este exceso de tensin es la causa de que las fibras del ligamento se rompan.  SNTOMAS  Es posible que pierda el movimiento del dedo. Otros sntomas son:   Hematomas  Sensibilidad.  Hinchazn. DIAGNSTICO  Con el fin de diagnosticar el esguince de dedo, el mdico examinar el dedo para Research officer, trade union de desgarro del ligamento. El mdico tambin puede indicar una radiografa del dedo para asegurarse de que no hay huesos rotos.  TRATAMIENTO  Si el ligamento est slo parcialmente roto, el tratamiento generalmente consiste en mantenerlo en una posicin fija (immovilizacin) durante un corto perodo. Para ello, el mdico aplicar un vendaje, yeso, o frula para impedir que el dedo se [employer] se cure. Para un ligamento parcialmente roto, el proceso de curacin por lo general demora de 2 a 3 semanas.  Si el ligamento est completamente roto, podr necesitar una ciruga para volver a unir el ligamento al [employer]. Despus de la Designer, jewellery un yeso o una frula y tendr que dejar el dedo inmvil durante 4 a 6 semanas, mientras que el ligamento se [place].  INSTRUCCIONES PARA EL CUIDADO EN EL HOGAR   Mantenga el dedo afectado elevado cuando le sea posible, para disminuir la hinchazn.  Para Best boy y la hinchazn, aplique  hielo en la articulacin dos veces por da, durante 2 a 3 das:  Ponga el hielo en una bolsa plstica.  Colquese una toalla entre la piel y la bolsa de hielo.  Deje el hielo en el lugar durante 15 minutos.  Tome slo medicamentos de venta libre o recetados para Glass blower/designer, segn las indicaciones del mdico.  No use anillos en el dedo lesionado.  No deje su dedo sin proteccin hasta que el dolor y la rigidez desaparezcan (generalmente entre 3 a 4 semanas).  No deje que el yeso o la frula se mojen. Cbralos con una bolsa plstica cuando se d un bao o una ducha. No debe practicar natacin.  El mdico podr indicarle ejercicios especiales para que haga durante la recuperacin para Product/process development scientist o limitar la rigidez [place]. SOLICITE ATENCIN MDICA DE INMEDIATO SI:   El yeso o la frula se daan.  El Nutritional therapist de Teacher, English as a foreign language. ASEGRESE DE QUE:   Comprende estas instrucciones.  Controlar su enfermedad.  Solicitar ayuda de inmediato si no mejora o si empeora. Document Released: 05/25/2005 Document Revised: 08/17/2011 [facility] Patient Information 2014 [place], [place].

## 2013-09-20 NOTE — ED Provider Notes (Signed)
CSN: [id number]     Arrival date & time 09/20/13  1817 History   First MD Initiated Contact with Patient 09/20/13 1959     Chief Complaint  Patient presents with  . Hand Injury   (Consider location/radiation/quality/duration/timing/severity/associated sxs/prior Treatment) Patient is a 38 y.o. female presenting with hand injury. The history is provided by the patient.  Hand Injury Location:  Finger (left and right fifth fingers) Time since incident:  4 days Injury: yes (injured while playing volleyball with her children)   Finger location:  L little finger and R little finger Chronicity:  New Dislocation: no   Prior injury to area:  No   Past Medical History  Diagnosis Date  . History of bronchitis     "I've stayed in hospital 2 X w/bronchitis"  . Asthma   . History of blood transfusion 1999    w/vaginal delivery   Past Surgical History  Procedure Laterality Date  . Cholecystectomy  2011  . Cesarean section  1997; 2008  . Tubal ligation  04/2007   Family History  Problem Relation Age of Onset  . Diabetes Mother   . Osteoarthritis Mother   . Hypertension Mother    History  Substance Use Topics  . Smoking status: Never Smoker   . Smokeless tobacco: Never Used  . Alcohol Use: No   OB History   Grav Para Term Preterm Abortions TAB SAB Ect Mult Living                 Review of Systems  All other systems reviewed and are negative.   Allergies  Review of patient's allergies indicates no known allergies.  Home Medications   Prior to Admission medications   Medication Sig Start Date End Date Taking? Authorizing Provider  albuterol (PROVENTIL,VENTOLIN) 90 MCG/ACT inhaler Inhale 2 puffs into the lungs 2 (two) times daily as needed for wheezing or shortness of breath. For wheeze or shortness of breath    Historical Provider, MD  butalbital-acetaminophen-caffeine (FIORICET) 50-325-40 MG per tablet Take 1-2 tablets by mouth every 6 (six) hours as needed for headache.  07/31/13   Lorayne Marek, MD  cyclobenzaprine (FLEXERIL) 10 MG tablet Take 1 tablet (10 mg total) by mouth at bedtime. 07/31/13   Lorayne Marek, MD  HYDROcodone-acetaminophen (NORCO/VICODIN) 5-325 MG per tablet Take 1-2 tablets by mouth every 4 (four) hours as needed for pain. 03/14/13   Ruthell Rummage Dammen, PA-C  ibuprofen (ADVIL,MOTRIN) 200 MG tablet Take 800 mg by mouth every 6 (six) hours as needed for pain.     Historical Provider, MD  ondansetron (ZOFRAN ODT) 8 MG disintegrating tablet 8mg  ODT q4 hours prn nausea 03/14/13   Ruthell Rummage Dammen, PA-C  ondansetron (ZOFRAN) 4 MG tablet Take 1 tablet (4 mg total) by mouth every 6 (six) hours. Prn nausea 07/06/13   Janne Napoleon, NP  promethazine (PHENERGAN) 25 MG tablet Take 1 tablet (25 mg total) by mouth every 6 (six) hours as needed for nausea. 03/14/13   Ruthell Rummage Dammen, PA-C  sulfamethoxazole-trimethoprim (SEPTRA DS) 800-160 MG per tablet Take 1 tablet by mouth 2 (two) times daily. X 5 days 03/16/13   Janne Napoleon, NP   BP 148/81  Pulse 71  Temp(Src) 98.2 F (36.8 C) (Oral)  Resp 16  SpO2 100%  LMP 09/20/2013 Physical Exam  Nursing note and vitals reviewed. Constitutional: She is oriented to person, place, and time. She appears well-developed and well-nourished. No distress.  HENT:  Head: Normocephalic and atraumatic.  Eyes:  Conjunctivae are normal.  Cardiovascular: Normal rate.   Pulmonary/Chest: Effort normal.  Musculoskeletal: Normal range of motion.       Hands: Outlined areas are areas of discomfort with ROM. No CMS deficits or obvious deformities of either left or right fifth fingers.  Neurological: She is alert and oriented to person, place, and time.  Skin: Skin is warm and dry.  Psychiatric: She has a normal mood and affect. Her behavior is normal.    ED Course  Procedures (including critical care time) Labs Review Labs Reviewed - No data to display  Results for orders placed during the hospital encounter of 03/16/13  CULTURE,  ROUTINE-ABSCESS      Result Value Ref Range   Specimen Description ABSCESS LEFT ARM     Special Requests NONE     Gram Stain       Value: RARE WBC PRESENT,BOTH PMN AND MONONUCLEAR     NO SQUAMOUS EPITHELIAL CELLS SEEN     NO ORGANISMS SEEN     Performed at [facility]   Culture       Value: MULTIPLE ORGANISMS PRESENT, NONE PREDOMINANT NO STAPHYLOCOCCUS AUREUS ISOLATED NO GROUP A STREP (S.PYOGENES) ISOLATED     Performed at [facility]   Report Status 03/19/2013 FINAL     Imaging Review Dg Finger Little Left  09/20/2013   CLINICAL DATA:  Injury.  EXAM: LEFT LITTLE FINGER 2+V  COMPARISON:  None.  FINDINGS: There is no evidence of fracture or dislocation. There is no evidence of arthropathy or other focal bone abnormality. Soft tissues are unremarkable.  IMPRESSION: No acute fracture or dislocation.   Electronically Signed   By: Marin Olp M.D.   On: 09/20/2013 20:49   Dg Finger Little Right  09/20/2013   CLINICAL DATA:  Injury.  EXAM: RIGHT LITTLE FINGER 2+V  COMPARISON:  None.  FINDINGS: There is no evidence of fracture or dislocation. Subtle degenerative change of the fifth distal interphalangeal joint. Soft tissues are unremarkable.  IMPRESSION: No acute fracture or dislocation.   Electronically Signed   By: Marin Olp M.D.   On: 09/20/2013 20:50     MDM   1. Sprain of hand, fifth finger, left   2. Sprain of hand, fifth finger, right   Films negative for fracture or dislocation. Ice, elevation and ibuprofen with hand ortho follow up (Dr. Lenon Curt) if no improvement over the next 5-7 days.    [place], [place] 09/20/13 2105

## 2013-09-20 NOTE — ED Notes (Signed)
Playing ball w her children on 4-13, caught ball wrong, Pain left hand 5 th finger and MC  w ?deformity?, unable to flex. Pain right hand 5 th finger , unable to make a fist, ?deformity. Left hand> right hand

## 2013-09-21 NOTE — ED Provider Notes (Signed)
Medical screening examination/treatment/procedure(s) were performed by non-physician practitioner and as supervising physician I was immediately available for consultation/collaboration.  Philipp Deputy, M.D.  Harden Mo, MD 09/21/13 [phone]

## 2013-10-06 ENCOUNTER — Encounter: Payer: Self-pay | Admitting: Family Medicine

## 2013-10-06 ENCOUNTER — Ambulatory Visit: Payer: No Typology Code available for payment source | Attending: Family Medicine | Admitting: Family Medicine

## 2013-10-06 VITALS — BP 112/75 | HR 93 | Temp 99.1°F | Resp 24 | Ht 61.0 in | Wt 195.0 lb

## 2013-10-06 DIAGNOSIS — J069 Acute upper respiratory infection, unspecified: Secondary | ICD-10-CM

## 2013-10-06 DIAGNOSIS — J45901 Unspecified asthma with (acute) exacerbation: Secondary | ICD-10-CM | POA: Insufficient documentation

## 2013-10-06 DIAGNOSIS — J020 Streptococcal pharyngitis: Secondary | ICD-10-CM

## 2013-10-06 DIAGNOSIS — J45909 Unspecified asthma, uncomplicated: Secondary | ICD-10-CM

## 2013-10-06 DIAGNOSIS — Z79899 Other long term (current) drug therapy: Secondary | ICD-10-CM | POA: Insufficient documentation

## 2013-10-06 DIAGNOSIS — R062 Wheezing: Secondary | ICD-10-CM

## 2013-10-06 LAB — POCT RAPID STREP A (OFFICE): RAPID STREP A SCREEN: NEGATIVE

## 2013-10-06 MED ORDER — ALBUTEROL SULFATE HFA 108 (90 BASE) MCG/ACT IN AERS: 2.0000 | INHALATION_SPRAY | Freq: Four times a day (QID) | RESPIRATORY_TRACT | Status: DC | PRN

## 2013-10-06 MED ORDER — PREDNISONE 20 MG PO TABS: 40.0000 mg | ORAL_TABLET | Freq: Every day | ORAL | Status: DC

## 2013-10-06 MED ORDER — IPRATROPIUM-ALBUTEROL 0.5-2.5 (3) MG/3ML IN SOLN
3.0000 mL | Freq: Once | RESPIRATORY_TRACT | Status: AC
  Administered 2013-10-06: 3 mL via RESPIRATORY_TRACT

## 2013-10-06 MED ORDER — BECLOMETHASONE DIPROPIONATE 80 MCG/ACT IN AERS: 2.0000 | INHALATION_SPRAY | Freq: Two times a day (BID) | RESPIRATORY_TRACT | Status: DC

## 2013-10-06 NOTE — Progress Notes (Signed)
   Subjective:    Patient ID: Alexandra Norris, female    DOB: 1976/02/15, 38 y.o.   MRN: [id number]  HPI  Asthma Exacerbation For last week trouble breathig. Ran out of albuterol.  Not using any other inhalers.   No fever but is wheezing.  No leg swelling.  No sick contacts.  No recent hospitilizations  Review of Symptoms - see HPI  PMH - Smoking status noted.     Review of Systems     Objective:   Physical Exam No acute distress Lungs - abnormal, diffuse wheezing with prolonged expiratory phase bilaterally Both ears clear of infection or redness Throat- no exudate noted Neck:  No deformities, thyromegaly, masses, or tenderness noted.   Supple with full range of motion without pain.   Patient with less wheezing and feels better after albuterol neb            Assessment & Plan:

## 2013-10-06 NOTE — Progress Notes (Deleted)
Pt comes

## 2013-10-06 NOTE — Patient Instructions (Signed)
Good to see you today!  Thanks for coming in.  Take all the prednisone 2 tabs every day for the next 5 days  Use the Qvar inhaler twice daily every day from now on  Use Albuterol inhaler every 4 hours as needed   If you become more short of breath or having to use the Albuterol every 2 hours go to the ER  Come back for a check up in 1 month

## 2013-10-06 NOTE — Progress Notes (Signed)
Pt comes in with c/o 2 weeks URI sx fever,sore throat worsened in last 2 days constant cough with white/yellow sputum, chest pressure,sob and wheezing at night.pt states she has tried otc thera-flu,robitussin and Ibuprofen unrelieved by cough Wheezing noted. Pt has hx asthma. States she has run out of inhaler. sats 94% R/A, freq cough noted with labored breathing Rapid strep obtained Albuterol neb treatment started

## 2013-10-06 NOTE — Assessment & Plan Note (Signed)
Acute exacerbation of asthma.  No signs of pneumonia.  Will treat with oral steroids and inhalers.

## 2013-10-08 LAB — CULTURE, GROUP A STREP: ORGANISM ID, BACTERIA: NORMAL

## 2013-10-27 ENCOUNTER — Encounter: Payer: Self-pay | Admitting: Internal Medicine

## 2013-10-27 ENCOUNTER — Ambulatory Visit: Payer: No Typology Code available for payment source | Attending: Internal Medicine | Admitting: Internal Medicine

## 2013-10-27 VITALS — BP 126/82 | HR 72 | Temp 98.2°F | Resp 16 | Wt 197.4 lb

## 2013-10-27 DIAGNOSIS — J45909 Unspecified asthma, uncomplicated: Secondary | ICD-10-CM | POA: Insufficient documentation

## 2013-10-27 DIAGNOSIS — Z79899 Other long term (current) drug therapy: Secondary | ICD-10-CM | POA: Insufficient documentation

## 2013-10-27 DIAGNOSIS — L909 Atrophic disorder of skin, unspecified: Secondary | ICD-10-CM | POA: Insufficient documentation

## 2013-10-27 DIAGNOSIS — K089 Disorder of teeth and supporting structures, unspecified: Secondary | ICD-10-CM | POA: Insufficient documentation

## 2013-10-27 DIAGNOSIS — M62838 Other muscle spasm: Secondary | ICD-10-CM | POA: Insufficient documentation

## 2013-10-27 DIAGNOSIS — K0889 Other specified disorders of teeth and supporting structures: Secondary | ICD-10-CM

## 2013-10-27 DIAGNOSIS — R519 Headache, unspecified: Secondary | ICD-10-CM

## 2013-10-27 DIAGNOSIS — L919 Hypertrophic disorder of the skin, unspecified: Secondary | ICD-10-CM

## 2013-10-27 DIAGNOSIS — R51 Headache: Secondary | ICD-10-CM

## 2013-10-27 MED ORDER — PREDNISONE 20 MG PO TABS: 20.0000 mg | ORAL_TABLET | Freq: Every day | ORAL | Status: DC

## 2013-10-27 MED ORDER — BUTALBITAL-APAP-CAFFEINE 50-325-40 MG PO TABS: 1.0000 | ORAL_TABLET | Freq: Four times a day (QID) | ORAL | Status: DC | PRN

## 2013-10-27 MED ORDER — CYCLOBENZAPRINE HCL 10 MG PO TABS: 10.0000 mg | ORAL_TABLET | Freq: Every day | ORAL | Status: DC

## 2013-10-27 MED ORDER — ALBUTEROL SULFATE HFA 108 (90 BASE) MCG/ACT IN AERS: 2.0000 | INHALATION_SPRAY | Freq: Four times a day (QID) | RESPIRATORY_TRACT | Status: DC | PRN

## 2013-10-27 MED ORDER — BECLOMETHASONE DIPROPIONATE 80 MCG/ACT IN AERS: 2.0000 | INHALATION_SPRAY | Freq: Two times a day (BID) | RESPIRATORY_TRACT | Status: DC

## 2013-10-27 NOTE — Patient Instructions (Signed)
Migraine Headache A migraine headache is an intense, throbbing pain on one or both sides of your head. A migraine can last for 30 minutes to several hours. CAUSES  The exact cause of a migraine headache is not always known. However, a migraine may be caused when nerves in the brain become irritated and release chemicals that cause inflammation. This causes pain. Certain things may also trigger migraines, such as:  Alcohol.  Smoking.  Stress.  Menstruation.  Aged cheeses.  Foods or drinks that contain nitrates, glutamate, aspartame, or tyramine.  Lack of sleep.  Chocolate.  Caffeine.  Hunger.  Physical exertion.  Fatigue.  Medicines used to treat chest pain (nitroglycerine), birth control pills, estrogen, and some blood pressure medicines. SIGNS AND SYMPTOMS  Pain on one or both sides of your head.  Pulsating or throbbing pain.  Severe pain that prevents daily activities.  Pain that is aggravated by any physical activity.  Nausea, vomiting, or both.  Dizziness.  Pain with exposure to bright lights, loud noises, or activity.  General sensitivity to bright lights, loud noises, or smells. Before you get a migraine, you may get warning signs that a migraine is coming (aura). An aura may include:  Seeing flashing lights.  Seeing bright spots, halos, or zig-zag lines.  Having tunnel vision or blurred vision.  Having feelings of numbness or tingling.  Having trouble talking.  Having muscle weakness. DIAGNOSIS  A migraine headache is often diagnosed based on:  Symptoms.  Physical exam.  A CT scan or MRI of your head. These imaging tests cannot diagnose migraines, but they can help rule out other causes of headaches. TREATMENT Medicines may be given for pain and nausea. Medicines can also be given to help prevent recurrent migraines.  HOME CARE INSTRUCTIONS  Only take over-the-counter or prescription medicines for pain or discomfort as directed by your  health care provider. The use of long-term narcotics is not recommended.  Lie down in a dark, quiet room when you have a migraine.  Keep a journal to find out what may trigger your migraine headaches. For example, write down:  What you eat and drink.  How much sleep you get.  Any change to your diet or medicines.  Limit alcohol consumption.  Quit smoking if you smoke.  Get 7 9 hours of sleep, or as recommended by your health care provider.  Limit stress.  Keep lights dim if bright lights bother you and make your migraines worse. SEEK IMMEDIATE MEDICAL CARE IF:   Your migraine becomes severe.  You have a fever.  You have a stiff neck.  You have vision loss.  You have muscular weakness or loss of muscle control.  You start losing your balance or have trouble walking.  You feel faint or pass out.  You have severe symptoms that are different from your first symptoms. MAKE SURE YOU:   Understand these instructions.  Will watch your condition.  Will get help right away if you are not doing well or get worse. Document Released: 05/25/2005 Document Revised: 03/15/2013 Document Reviewed: 01/30/2013 [employer] Patient Information 2014 [employer].  

## 2013-10-27 NOTE — Progress Notes (Addendum)
Patient ID: Alexandra Norris, female   DOB: [birth year], 38 y.o.   MRN: [id number]  CC: asthma f/u, neck pain  HPI: Patient presents today for a f/u of her asthma and frequent headaches.  Patient reports significant improvement in SOB since beginning QVAR.  She reports her headaches start in occipital and parietal region, four days out of the week, headache upon waking up, last all day or until take 5 ibuprofen. She reports nausea and vomiting, photophobia, tingling in fingers with headache. She reports improvement in headaches with fiorcet but she has been out.  She reports that she was given "shot" at [facility] for headaches once that improved headache frequency and severity for weeks.  She also c/o a painful skin tag that started one month and now growing, draining blood, and itching. She denies color change.   Dental referral- pain on left side.    No Known Allergies Past Medical History  Diagnosis Date  . History of bronchitis     "I've stayed in hospital 2 X w/bronchitis"  . Asthma   . History of blood transfusion 1999    w/vaginal delivery   Current Outpatient Prescriptions on File Prior to Visit  Medication Sig Dispense Refill  . albuterol (PROVENTIL HFA;VENTOLIN HFA) 108 (90 BASE) MCG/ACT inhaler Inhale 2 puffs into the lungs every 6 (six) hours as needed for wheezing.  1 Inhaler  0  . beclomethasone (QVAR) 80 MCG/ACT inhaler Inhale 2 puffs into the lungs 2 (two) times daily.  1 Inhaler  3  . butalbital-acetaminophen-caffeine (FIORICET) 50-325-40 MG per tablet Take 1-2 tablets by mouth every 6 (six) hours as needed for headache.  30 tablet  0  . cyclobenzaprine (FLEXERIL) 10 MG tablet Take 1 tablet (10 mg total) by mouth at bedtime.  30 tablet  1  . ibuprofen (ADVIL,MOTRIN) 200 MG tablet Take 800 mg by mouth every 6 (six) hours as needed for pain.       . predniSONE (DELTASONE) 20 MG tablet Take 2 tablets (40 mg total) by mouth daily with breakfast.  10 tablet  0   No  current facility-administered medications on file prior to visit.   Family History  Problem Relation Age of Onset  . Diabetes Mother   . Osteoarthritis Mother   . Hypertension Mother    History   Social History  . Marital Status: Married    Spouse Name: N/A    Number of Children: N/A  . Years of Education: N/A   Occupational History  . Not on file.   Social History Main Topics  . Smoking status: Never Smoker   . Smokeless tobacco: Never Used  . Alcohol Use: No  . Drug Use: No  . Sexual Activity: Yes   Other Topics Concern  . Not on file   Social History Narrative  . No narrative on file    Review of Systems: See HPI   Objective:   Filed Vitals:   10/27/13 1113  BP: 126/82  Pulse: 72  Temp: 98.2 F (36.8 C)  Resp: 16   Physical Exam  Vitals reviewed. HENT:  Right Ear: External ear normal.  Left Ear: External ear normal.  Eyes: EOM are normal. Pupils are equal, round, and reactive to light.  Neck: Normal range of motion. Neck supple.  Cardiovascular: Normal rate, regular rhythm and normal heart sounds.   Pulmonary/Chest: Effort normal and breath sounds normal. She has no wheezes.  Abdominal: Soft. Bowel sounds are normal.  Musculoskeletal: Normal range of motion.  She exhibits tenderness.  Tenderness noted in occipital region and left trapezius muscle. Muscle tension noted   Lymphadenopathy:    She has no cervical adenopathy.  Skin: Skin is warm and dry.  Small protruding skin tag on right side of nose      Lab Results  Component Value Date   WBC 11.7* 03/13/2013   HGB 13.6 03/13/2013   HCT 40.2 03/13/2013   MCV 82.5 03/13/2013   PLT 293 03/13/2013   Lab Results  Component Value Date   CREATININE 0.46* 03/13/2013   BUN 12 03/13/2013   NA 140 03/13/2013   K 4.0 03/13/2013   CL 105 03/13/2013   CO2 21 03/13/2013    No results found for this basename: HGBA1C   Lipid Panel     Component Value Date/Time   CHOL 143 01/03/2013 1603   TRIG 107  01/03/2013 1603   HDL 48 01/03/2013 1603   CHOLHDL 3.0 01/03/2013 1603   VLDL 21 01/03/2013 1603   LDLCALC 74 01/03/2013 1603       Assessment and plan:   Alexandra Norris was seen today for follow-up.  Diagnoses and associated orders for this visit:  Unspecified asthma(493.90) - albuterol (PROVENTIL HFA;VENTOLIN HFA) 108 (90 BASE) MCG/ACT inhaler; Inhale 2 puffs into the lungs every 6 (six) hours as needed for wheezing. - beclomethasone (QVAR) 80 MCG/ACT inhaler; Inhale 2 puffs into the lungs 2 (two) times daily.  Pain, dental - Ambulatory referral to Dentistry List of low cost dental providers given to patient Muscle spasm - cyclobenzaprine (FLEXERIL) 10 MG tablet; Take 1 tablet (10 mg total) by mouth at bedtime. Explained that Flexeril will help with muscle tension associated with headaches as well Frequent headaches - predniSONE (DELTASONE) 20 MG tablet; Take 1 tablet (20 mg total) by mouth daily with breakfast. To help break cycle of constant headaches. - butalbital-acetaminophen-caffeine (FIORICET) 50-325-40 MG per tablet; Take 1 tablet by mouth every 6 (six) hours as needed for headache. May need referral to headache specialist if headaches persist.  Return if symptoms worsen or fail to improve.  Due to language barrier, an interpreter was present during the history-taking and subsequent discussion (and for part of the physical exam) with this patient.   Lance Bosch, [facility] [phone] 10/27/2013, 11:23 AM

## 2013-10-27 NOTE — Progress Notes (Signed)
Patient here with interpreter States here for follow up on her asthma Complains of still having pain to the back of her neck

## 2013-11-21 ENCOUNTER — Encounter: Payer: Self-pay | Admitting: Internal Medicine

## 2013-11-21 ENCOUNTER — Ambulatory Visit: Payer: No Typology Code available for payment source | Attending: Internal Medicine | Admitting: Internal Medicine

## 2013-11-21 VITALS — BP 113/78 | HR 70 | Temp 98.9°F | Resp 14 | Ht 61.0 in | Wt 196.6 lb

## 2013-11-21 DIAGNOSIS — M25569 Pain in unspecified knee: Secondary | ICD-10-CM | POA: Insufficient documentation

## 2013-11-21 DIAGNOSIS — IMO0002 Reserved for concepts with insufficient information to code with codable children: Secondary | ICD-10-CM | POA: Insufficient documentation

## 2013-11-21 DIAGNOSIS — Z131 Encounter for screening for diabetes mellitus: Secondary | ICD-10-CM

## 2013-11-21 DIAGNOSIS — M25562 Pain in left knee: Secondary | ICD-10-CM

## 2013-11-21 DIAGNOSIS — I10 Essential (primary) hypertension: Secondary | ICD-10-CM | POA: Insufficient documentation

## 2013-11-21 DIAGNOSIS — Z79899 Other long term (current) drug therapy: Secondary | ICD-10-CM | POA: Insufficient documentation

## 2013-11-21 LAB — GLUCOSE, POCT (MANUAL RESULT ENTRY): POC Glucose: 105 mg/dl — AB (ref 70–99)

## 2013-11-21 LAB — POCT GLYCOSYLATED HEMOGLOBIN (HGB A1C): Hemoglobin A1C: 5.5

## 2013-11-21 MED ORDER — MELOXICAM 15 MG PO TABS: 15.0000 mg | ORAL_TABLET | Freq: Every day | ORAL | Status: DC

## 2013-11-21 NOTE — Patient Instructions (Addendum)
dChalazin  (Chalazion) Un chalazin es una hinchazn o bulto duro en el prpado originado en la obstruccin de una glndula sebcea. Puede ocurrir en el prpado superior o en el inferior.  CAUSAS  Obstruccin de una glndula sebcea del prpado.  SNTOMAS   Hinchazn o bulto duro en el prpado. El bulto puede dificultarle la visin.  La hinchazn puede extenderse a zonas que rodean el ojo. TRATAMIENTO   Aunque en algunos casos desaparecen por s mismos en 1  2 meses, en otros casos deben extirparse.  Podr necesitar medicamentos para tratar la infeccin. INSTRUCCIONES PARA EL CUIDADO EN EL HOGAR   Lave sus manos con frecuencia y squelas con una toalla limpia. No toque el chalazin.  Aplique calor sobre el prpado varias veces por da durante 10 minutos para calmar las molestias y Automotive engineer la salida del lquido blanco amarillento (pus) a la superficie. Una forma de Presenter, broadcasting es usar una cuchara de metal.  Sostenga el mango debajo del agua caliente hasta que tome calor y luego envulvalo en toallas de papel, de modo que el calor llegue sin quemar la piel.  Sostenga el mango envuelto en papel contra el chalazin y vuelva a calentarlo cuando lo necesite.  Aplique calor durante 10 minutos, 4 veces por da.  Concurra al mdico para que le retire el pus, si no se abre ruptura  por s mismo.  No trate de extraer usted mismo el pus apretando el chalazin o pinchndolo con un alfiler o una aguja.  Slo tome medicamentos de venta libre o recetados para Glass blower/designer, las molestias o bajar la fiebre segn las indicaciones de su mdico. SOLICITE ATENCIN MDICA DE INMEDIATO SI:   Siente dolor en el abdomen.  Hay cambios en la visin.  El dolor persiste.  El chalazin se torna doloroso, rojo o hinchado, se agranda y no empieza a [place] de 2 semanas. ASEGRESE DE QUE:   Comprende estas instrucciones.  Controlar su enfermedad.  Solicitar ayuda de inmediato si  no mejora o si empeora. Document Released: 05/25/2005 Document Revised: 11/24/2011 [facility] Patient Information 2014 [place], [place]. Dolor neuroptico (Neuropathic Pain) A menudo creemos que el dolor tiene una causa fsica. Si eliminamos la causa, el dolor debera irse. Los nervios en s mismos tambin pueden Engineer, drilling. Esto se denomina dolor neuroptico, que implica una anormalidad del nervio. Puede ser difcil para los pacientes que lo padecen y para los profesionales que los asisten. El dolor normalmente se describe como agudo (de corta duracin) o crnico (de larga duracin). El dolor agudo se relaciona con las sensaciones fsicas que puede provocar una lesin. Puede durar unos pocos segundos o muchas semanas, pero a menudo desaparece cuando la lesin se cura. El dolor crnico dura ms que el tiempo normal de curacin. En el dolor neuroptico, las fibras nerviosas pueden lesionarse o daarse. Entonces envan seales incorrectas a otros centros de Social research officer, government. El dolor que se siente es real, pero la causa no es fcil de Pension scheme manager.  CAUSAS El dolor crnico puede ser resultado de enfermedades como la diabetes y el herpes (una infeccin relacionada con la varicela), o debido a un traumatismo, una ciruga o una amputacin. Tambin puede ocurrir sin ninguna enfermedad o lesin conocida. Los nervios envan mensajes de dolor, an cuando no haya una causa identificable de tales mensajes.   Otras causas comunes de la neuropata incluyen diabetes, sndrome del Building control surveyor, o Sndrome de [employer] [place]  Como ocurre en todas las formas de [place] de [place],  si la neuropata no se trata correctamente, puede existir una serie de problemas asociados que pueden llevar a una espiral negativa para el paciente. Estos problemas incluyen depresin, insomnio, sensacin de miedo y [place], Printmaker social reducida e incapacidad para realizar actividades cotidianas o trabajar.  El ejemplo ms dramtico y  misterioso de Social research officer, government neuroptico es el denominado "sndrome del miembro fantasma". Esto ocurre cuando se amputa un brazo o una pierna debido a una enfermedad o lesin. El cerebro contina recibiendo mensajes de los nervios que originalmente transportaban los impulsos del [place]. Estos nervios envan seales incorrectas y Garment/textile technologist.  Es comn que el dolor neuroptico no tenga una causa. Responde pobremente al tratamiento para el dolor. El dolor neuroptico puede ocurrir luego de:  Herpes (Infeccin del virus Herpes Zoster).  Una sensacin de [employer] piel, causada comnmente por una lesin en el nervio perifrico.  Neuropata perifrica que es un dao generalizado en los nervios, a menudo causado por la diabetes o el alcoholismo.  Dolor del miembro fantasma luego de una amputacin.  Problemas en los nervios faciales (neuralgia del trigmino).  Esclerosis mltiple.  [place].  Dolor que Scientist, forensic y la quimioterapia.  Neuropata por atrapamiento, cuando el nervio sufre presin como en el sndrome del tnel carpiano.  Problemas de la espalda, la pierna y la cadera (citica).  Ciruga de la espalda o la espina dorsal.  Infeccin de HIV o SIDA, en donde los nervios se infectan con el virus. El profesional que le asiste le explicar los puntos de esta lista que pueden estar referidos a usted. [place] del dolor neuroptico son:   Es un dolor intenso, agudo, similar a Automotive engineer, punzante, como si le clavaran un cuchillo.  Sensacin de pinchazos.  Sensacin profunda de quemazn, fro o dolor.  Debilidad persistente, hormigueo o debilidad.  La aparicion de [employer] de un toque suave u otro estmulo que normalmente no causara dolor.  Una mayor sensibilidad hacia cosas que normalmente causan dolor (un pinchazo). Es comn que exista un dolor persistente a Proofreader de estmulos no dolorosos como un  toque [place]. El dolor puede persistir por meses o aos luego de que se hayan curado los tejidos daados. Cuando esto ocurre, las seales de dolor ya no disparan la alarma ante agresiones presentes o prximas. El sistema de alarma en s no est funcionando correctamente. Dolor neuroptico Orthoptist de mejorar con [employer]. Para algunas personas, puede resultar en una incapacidad grave. Es importante saber que an un traumatismo grave en un miembro puede no producir una adecuada respuesta de proteccin Visual merchandiser.Algunas quemaduras, cortes y otras lesiones pueden pasar inadvertidas. Sin el tratamiento [place], estas lesiones pueden infectarse o producir discapacidad. Considere seriamente cualquier lesin y consulte a su mdico para recibir Clinical research associate. DIAGNSTICO Cuando tiene dolor sin causa conocida, el profesional que lo asiste Physicist, medical preguntas especficas:   Tiene otras enfermedades, como diabetes, herpes, esclerosis mltiple, o infeccin de HIV?  Cmo describira el dolor? (El dolor neuroptico a menudo se describe como punzante, lancinante, ardiente o abrasador.)  Empeora el dolor en algn momento del da? (El dolor neuroptico generalmente empeora por la noche.)  Siente que el dolor sigue determinados trayectos fsicos?  El dolor proviene de una zona que tiene nervios lesionados o ausentes? Un ejemplo sera el dolor del [employer].  El dolor se dispara por estmulos pequeos tales como el roce con las sbanas por las noches? Estas  preguntas ayudan a definir el tipo de [employer]. Una vez que el profesional que lo asiste sepa lo que est ocurriendo podr Biochemist, clinical. Los antiespasmdicos, las drogas antidepresivas, y distintos analgsicos parecen funcionar en [employer]. Si est involucrada alguna otra enfermedad, como la diabetes, un tratamiento mejor de esa enfermedad puede Best boy neuroptico.  TRATAMIENTO El  dolor neuroptico a menudo es de larga duracin y tiende a no responder al tratamiento de medicamentos para el dolor de tipo narctico. Puede responder bien a otras drogas tales como anticonvulsivos y antidepresivos. Normalmente, los problemas neuropticos no se van nunca del todo, pero es posible lograr una mejora parcial con el tratamiento adecuado. Los profesionales que lo asisten tienen muchos medicamentos disponibles para tratar su problema. No se desanime si no obtiene alivio inmediato. En ocasiones deben probarse distintos medicamentos o combinaciones de ellos antes de que [employer]. Asegrese de ver al profesional que lo asiste si siente dolor que no parece provenir de ningn lado y no se va. La ayuda est a su alcance.  SOLICITE ATENCIN MDICA DE INMEDIATO SI:   Hay un cambio repentino en la calidad del dolor, especialmente si observa el cambio slo en un lado del cuerpo.  Nota modificaciones en la piel, como enrojecimiento, cambios en el color a negro o prpura, hinchazn o una lcera.  No puede mover el miembro afectado. Document Released: 09/01/2007 Document Revised: 08/17/2011 [facility] Patient Information 2014 [place], [place].

## 2013-11-21 NOTE — Progress Notes (Signed)
Patient ID: Alexandra Norris, female   DOB: [birth year], 38 y.o.   MRN: [id number]  CC: knee pain  HPI: Patient reports that she for 3 weeks she has had sudden left knee pain, mostly with bending knee or going up stairs. She reports swelling that feels like fluid on her knee.  Patient reports that her knee mostly feels like soreness rather than pain.  She also reports a one month history of tingling in upper and lower extremities.     No Known Allergies Past Medical History  Diagnosis Date  . History of bronchitis     "I've stayed in hospital 2 X w/bronchitis"  . Asthma   . History of blood transfusion 1999    w/vaginal delivery   Current Outpatient Prescriptions on File Prior to Visit  Medication Sig Dispense Refill  . albuterol (PROVENTIL HFA;VENTOLIN HFA) 108 (90 BASE) MCG/ACT inhaler Inhale 2 puffs into the lungs every 6 (six) hours as needed for wheezing.  1 Inhaler  0  . beclomethasone (QVAR) 80 MCG/ACT inhaler Inhale 2 puffs into the lungs 2 (two) times daily.  1 Inhaler  3  . butalbital-acetaminophen-caffeine (FIORICET) 50-325-40 MG per tablet Take 1 tablet by mouth every 6 (six) hours as needed for headache.  30 tablet  1  . cyclobenzaprine (FLEXERIL) 10 MG tablet Take 1 tablet (10 mg total) by mouth at bedtime.  30 tablet  1  . predniSONE (DELTASONE) 20 MG tablet Take 1 tablet (20 mg total) by mouth daily with breakfast.  5 tablet  0  . ibuprofen (ADVIL,MOTRIN) 200 MG tablet Take 800 mg by mouth every 6 (six) hours as needed for pain.       . predniSONE (DELTASONE) 20 MG tablet Take 2 tablets (40 mg total) by mouth daily with breakfast.  10 tablet  0   No current facility-administered medications on file prior to visit.   Family History  Problem Relation Age of Onset  . Diabetes Mother   . Osteoarthritis Mother   . Hypertension Mother    History   Social History  . Marital Status: Married    Spouse Name: N/A    Number of Children: N/A  . Years of Education: N/A    Occupational History  . Not on file.   Social History Main Topics  . Smoking status: Never Smoker   . Smokeless tobacco: Never Used  . Alcohol Use: No  . Drug Use: No  . Sexual Activity: Yes   Other Topics Concern  . Not on file   Social History Narrative  . No narrative on file   Review of Systems  Constitutional: Negative for fever, chills and weight loss.  Musculoskeletal:       Knee pain without redness or warmth  Neurological: Positive for tingling. Negative for dizziness, weakness and headaches.      Objective:   Filed Vitals:   11/21/13 1715  BP: 113/78  Pulse: 70  Temp: 98.9 F (37.2 C)  Resp: 14    Physical Exam: Constitutional: Patient appears well-developed and well-nourished. No distress. CVS: RRR, S1/S2 +, no murmurs, no gallops, no carotid bruit.  Pulmonary: Effort and breath sounds normal, no stridor, rhonchi, wheezes, rales.  Abdominal: Soft. BS +,  no distension, tenderness, rebound or guarding.  Musculoskeletal: Normal range of motion. No edema and no tenderness.  Lymphadenopathy: No lymphadenopathy noted, cervical, inguinal or axillary Neuro: Alert. Normal reflexes, muscle tone coordination. No cranial nerve deficit. Skin: Skin is warm and dry. No rash noted.  Not diaphoretic. No erythema. No pallor. Psychiatric: Normal mood and affect. Behavior, judgment, thought content normal.  Lab Results  Component Value Date   WBC 11.7* 03/13/2013   HGB 13.6 03/13/2013   HCT 40.2 03/13/2013   MCV 82.5 03/13/2013   PLT 293 03/13/2013   Lab Results  Component Value Date   CREATININE 0.46* 03/13/2013   BUN 12 03/13/2013   NA 140 03/13/2013   K 4.0 03/13/2013   CL 105 03/13/2013   CO2 21 03/13/2013    No results found for this basename: HGBA1C   Lipid Panel     Component Value Date/Time   CHOL 143 01/03/2013 1603   TRIG 107 01/03/2013 1603   HDL 48 01/03/2013 1603   CHOLHDL 3.0 01/03/2013 1603   VLDL 21 01/03/2013 1603   LDLCALC 74 01/03/2013 1603        Assessment and plan:   Alexandra Norris was seen today for follow up knee pain.  Diagnoses and associated orders for this visit:  Knee pain - meloxicam (MOBIC) 15 MG tablet; Take 1 tablet (15 mg total) by mouth daily.  Screening for diabetes mellitus - HgB A1c - Glucose (CBG)   Return if symptoms worsen or fail to improve.      Chari Manning, [facility] [phone] 11/23/2013, 12:34 PM

## 2013-11-21 NOTE — Progress Notes (Signed)
Patient here to f/u on left knee pain.  Patient states she has swelling in both feet. Patient does elevate feet when not standing.  Patient c/o tingling sensation in both hands intermittently when she is at rest.  Patient c/o  bump on left eye (outer lid) with itching.

## 2014-03-21 ENCOUNTER — Encounter (HOSPITAL_COMMUNITY): Payer: Self-pay | Admitting: Emergency Medicine

## 2014-03-21 ENCOUNTER — Emergency Department (HOSPITAL_COMMUNITY)
Admission: EM | Admit: 2014-03-21 | Discharge: 2014-03-21 | Disposition: A | Discharge status: Home / Self Care | Payer: Self-pay | Attending: Emergency Medicine | Admitting: Emergency Medicine

## 2014-03-21 DIAGNOSIS — R630 Anorexia: Secondary | ICD-10-CM | POA: Insufficient documentation

## 2014-03-21 DIAGNOSIS — R112 Nausea with vomiting, unspecified: Secondary | ICD-10-CM | POA: Insufficient documentation

## 2014-03-21 DIAGNOSIS — Z79899 Other long term (current) drug therapy: Secondary | ICD-10-CM | POA: Insufficient documentation

## 2014-03-21 DIAGNOSIS — R1084 Generalized abdominal pain: Secondary | ICD-10-CM | POA: Insufficient documentation

## 2014-03-21 DIAGNOSIS — J45909 Unspecified asthma, uncomplicated: Secondary | ICD-10-CM | POA: Insufficient documentation

## 2014-03-21 DIAGNOSIS — Z9889 Other specified postprocedural states: Secondary | ICD-10-CM | POA: Insufficient documentation

## 2014-03-21 DIAGNOSIS — Z9289 Personal history of other medical treatment: Secondary | ICD-10-CM | POA: Insufficient documentation

## 2014-03-21 DIAGNOSIS — R51 Headache: Secondary | ICD-10-CM | POA: Insufficient documentation

## 2014-03-21 DIAGNOSIS — Z3202 Encounter for pregnancy test, result negative: Secondary | ICD-10-CM | POA: Insufficient documentation

## 2014-03-21 DIAGNOSIS — M542 Cervicalgia: Secondary | ICD-10-CM | POA: Insufficient documentation

## 2014-03-21 DIAGNOSIS — Z9049 Acquired absence of other specified parts of digestive tract: Secondary | ICD-10-CM | POA: Insufficient documentation

## 2014-03-21 LAB — URINALYSIS, ROUTINE W REFLEX MICROSCOPIC
Bilirubin Urine: NEGATIVE
Glucose, UA: NEGATIVE mg/dL
Hgb urine dipstick: NEGATIVE
KETONES UR: 15 mg/dL — AB
LEUKOCYTES UA: NEGATIVE
Nitrite: NEGATIVE
PROTEIN: NEGATIVE mg/dL
Specific Gravity, Urine: 1.03 (ref 1.005–1.030)
UROBILINOGEN UA: 0.2 mg/dL (ref 0.0–1.0)
pH: 5.5 (ref 5.0–8.0)

## 2014-03-21 LAB — COMPREHENSIVE METABOLIC PANEL
ALT: 21 U/L (ref 0–35)
AST: 22 U/L (ref 0–37)
Albumin: 4.2 g/dL (ref 3.5–5.2)
Alkaline Phosphatase: 101 U/L (ref 39–117)
Anion gap: 13 (ref 5–15)
BUN: 10 mg/dL (ref 6–23)
CALCIUM: 9.4 mg/dL (ref 8.4–10.5)
CO2: 24 meq/L (ref 19–32)
CREATININE: 0.51 mg/dL (ref 0.50–1.10)
Chloride: 104 mEq/L (ref 96–112)
GFR calc Af Amer: >90 mL/min (ref >90)
GLUCOSE: 120 mg/dL — AB (ref 70–99)
Potassium: 3.8 mEq/L (ref 3.7–5.3)
Sodium: 141 mEq/L (ref 137–147)
TOTAL PROTEIN: 8.2 g/dL (ref 6.0–8.3)
Total Bilirubin: 0.9 mg/dL (ref 0.3–1.2)

## 2014-03-21 LAB — WET PREP, GENITAL
Clue Cells Wet Prep HPF POC: NONE SEEN
Trich, Wet Prep: NONE SEEN
WBC, Wet Prep HPF POC: NONE SEEN
Yeast Wet Prep HPF POC: NONE SEEN

## 2014-03-21 LAB — CBC WITH DIFFERENTIAL/PLATELET
BASOS ABS: 0 10*3/uL (ref 0.0–0.1)
Basophils Relative: 0 % (ref 0–1)
EOS PCT: 2 % (ref 0–5)
Eosinophils Absolute: 0.2 10*3/uL (ref 0.0–0.7)
HCT: 39.6 % (ref 36.0–46.0)
Hemoglobin: 13.3 g/dL (ref 12.0–15.0)
LYMPHS PCT: 27 % (ref 12–46)
Lymphs Abs: 2.7 10*3/uL (ref 0.7–4.0)
MCH: 27.4 pg (ref 26.0–34.0)
MCHC: 33.6 g/dL (ref 30.0–36.0)
MCV: 81.6 fL (ref 78.0–100.0)
MONO ABS: 0.7 10*3/uL (ref 0.1–1.0)
Monocytes Relative: 7 % (ref 3–12)
Neutro Abs: 6.6 10*3/uL (ref 1.7–7.7)
Neutrophils Relative %: 64 % (ref 43–77)
PLATELETS: 273 10*3/uL (ref 150–400)
RBC: 4.85 MIL/uL (ref 3.87–5.11)
RDW: 13.5 % (ref 11.5–15.5)
WBC: 10.2 10*3/uL (ref 4.0–10.5)

## 2014-03-21 LAB — POC URINE PREG, ED: Preg Test, Ur: NEGATIVE

## 2014-03-21 LAB — LIPASE, BLOOD: Lipase: 18 U/L (ref 11–59)

## 2014-03-21 MED ORDER — ONDANSETRON HCL 4 MG/2ML IJ SOLN
4.0000 mg | Freq: Once | INTRAMUSCULAR | Status: AC
  Administered 2014-03-21: 4 mg via INTRAVENOUS
  Filled 2014-03-21: qty 2

## 2014-03-21 MED ORDER — GI COCKTAIL ~~LOC~~
30.0000 mL | Freq: Once | ORAL | Status: AC
  Administered 2014-03-21: 30 mL via ORAL
  Filled 2014-03-21: qty 30

## 2014-03-21 MED ORDER — METOCLOPRAMIDE HCL 5 MG/ML IJ SOLN
10.0000 mg | Freq: Once | INTRAMUSCULAR | Status: AC
Start: 2014-03-21 — End: 2014-03-21
  Administered 2014-03-21: 10 mg via INTRAVENOUS
  Filled 2014-03-21: qty 2

## 2014-03-21 MED ORDER — DIPHENHYDRAMINE HCL 50 MG/ML IJ SOLN
25.0000 mg | Freq: Once | INTRAMUSCULAR | Status: AC
  Administered 2014-03-21: 25 mg via INTRAVENOUS
  Filled 2014-03-21: qty 1

## 2014-03-21 MED ORDER — MORPHINE SULFATE 4 MG/ML IJ SOLN
4.0000 mg | Freq: Once | INTRAMUSCULAR | Status: AC
  Administered 2014-03-21: 4 mg via INTRAVENOUS
  Filled 2014-03-21: qty 1

## 2014-03-21 MED ORDER — KETOROLAC TROMETHAMINE 30 MG/ML IJ SOLN
30.0000 mg | Freq: Once | INTRAMUSCULAR | Status: AC
  Administered 2014-03-21: 30 mg via INTRAVENOUS
  Filled 2014-03-21: qty 1

## 2014-03-21 MED ORDER — SODIUM CHLORIDE 0.9 % IV BOLUS (SEPSIS)
1000.0000 mL | Freq: Once | INTRAVENOUS | Status: AC
  Administered 2014-03-21: 1000 mL via INTRAVENOUS

## 2014-03-21 MED ORDER — ONDANSETRON 4 MG PO TBDP: 4.0000 mg | ORAL_TABLET | Freq: Three times a day (TID) | ORAL | Status: DC | PRN

## 2014-03-21 NOTE — ED Notes (Signed)
Per pt sts abdominal pain, N,V and headache x 3 days.

## 2014-03-21 NOTE — ED Provider Notes (Signed)
CSN: [id number]     Arrival date & time 03/21/14  1451 History   First MD Initiated Contact with Patient 03/21/14 1526     Chief Complaint  Patient presents with  . Emesis  . Abdominal Pain     (Consider location/radiation/quality/duration/timing/severity/associated sxs/prior Treatment) Patient is a 38 y.o. female presenting with abdominal pain.  Abdominal Pain Pain location:  Generalized Pain quality: sharp and stabbing   Pain radiates to:  Does not radiate Pain severity:  Severe Onset quality:  Gradual Timing:  Constant Progression:  Worsening Chronicity:  New Context: eating   Relieved by:  Nothing Worsened by:  Eating Ineffective treatments:  NSAIDs Associated symptoms: anorexia, dysuria, nausea and vomiting   Associated symptoms: no chest pain, no chills, no constipation, no cough, no diarrhea, no fever, no shortness of breath and no sore throat   Associated symptoms comment:  Headache, neck pain Risk factors: multiple surgeries   Risk factors: not pregnant     Past Medical History  Diagnosis Date  . History of bronchitis     "I've stayed in hospital 2 X w/bronchitis"  . Asthma   . History of blood transfusion 1999    w/vaginal delivery   Past Surgical History  Procedure Laterality Date  . Cholecystectomy  2011  . Cesarean section  1997; 2008  . Tubal ligation  04/2007   Family History  Problem Relation Age of Onset  . Diabetes Mother   . Osteoarthritis Mother   . Hypertension Mother    History  Substance Use Topics  . Smoking status: Never Smoker   . Smokeless tobacco: Never Used  . Alcohol Use: No   OB History   Grav Para Term Preterm Abortions TAB SAB Ect Mult Living                 Review of Systems  Constitutional: Negative for fever and chills.  HENT: Negative for congestion and sore throat.   Eyes: Negative for visual disturbance.  Respiratory: Negative for cough, shortness of breath and wheezing.   Cardiovascular: Negative for chest  pain.  Gastrointestinal: Positive for nausea, vomiting, abdominal pain and anorexia. Negative for diarrhea and constipation.  Genitourinary: Positive for dysuria. Negative for difficulty urinating and vaginal pain.  Musculoskeletal: Negative for arthralgias and myalgias.  Skin: Negative for rash.  Neurological: Negative for syncope and headaches.  Psychiatric/Behavioral: Negative for behavioral problems.  All other systems reviewed and are negative.     Allergies  Review of patient's allergies indicates no known allergies.  Home Medications   Prior to Admission medications   Medication Sig Start Date End Date Taking? Authorizing Provider  albuterol (PROVENTIL HFA;VENTOLIN HFA) 108 (90 BASE) MCG/ACT inhaler Inhale 2 puffs into the lungs every 6 (six) hours as needed for wheezing. 10/27/13  Yes Lance Bosch, NP  ondansetron (ZOFRAN ODT) 4 MG disintegrating tablet Take 1 tablet (4 mg total) by mouth every 8 (eight) hours as needed for nausea or vomiting. 03/21/14   Renne Musca, MD   BP 108/54  Pulse 88  Temp(Src) 98.6 F (37 C)  Resp 18  SpO2 98% Physical Exam  Vitals reviewed. Constitutional: She is oriented to person, place, and time. She appears well-developed and well-nourished. No distress.  HENT:  Head: Normocephalic and atraumatic.  Eyes: EOM are normal.  Neck: Normal range of motion.  Cardiovascular: Normal rate, regular rhythm and normal heart sounds.   No murmur heard. Pulmonary/Chest: Effort normal and breath sounds normal. No respiratory distress. She has  no wheezes.  Abdominal: Soft. There is tenderness in the right lower quadrant, suprapubic area and left lower quadrant. There is no rigidity, no rebound, no guarding, no CVA tenderness, no tenderness at McBurney's point and negative Murphy's sign.  Genitourinary: There is no tenderness on the right labia. There is no tenderness on the left labia. Cervix exhibits no motion tenderness, no discharge and no friability.  Right adnexum displays tenderness. Left adnexum displays tenderness. No erythema, tenderness or bleeding around the vagina. No vaginal discharge found.  Musculoskeletal: She exhibits no edema.  Neurological: She is alert and oriented to person, place, and time.  Skin: She is not diaphoretic.  Psychiatric: She has a normal mood and affect. Her behavior is normal.    ED Course  Procedures (including critical care time) Labs Review Labs Reviewed  COMPREHENSIVE METABOLIC PANEL - Abnormal; Notable for the following:    Glucose, Bld 120 (*)    All other components within normal limits  URINALYSIS, ROUTINE W REFLEX MICROSCOPIC - Abnormal; Notable for the following:    Color, Urine AMBER (*)    APPearance HAZY (*)    Ketones, ur 15 (*)    All other components within normal limits  WET PREP, GENITAL  GC/CHLAMYDIA PROBE AMP  CBC WITH DIFFERENTIAL  LIPASE, BLOOD  POC URINE PREG, ED    Imaging Review No results found.   EKG Interpretation None      MDM   Final diagnoses:  Generalized abdominal pain    Patient is a 38 year old Spanish-speaking female with a medical history of prior cholecystectomy, tubal ligation that presents with 3 days of generalized abdominal pain. Patient states the pain is worse in her bilateral lower quadrants and suprapubic area with associated dysuria. Patient does not think she's ever had a urinary tract infection in the past. Patient is socially active and does not use protection and spelled this partner for 2 years. Patient prior STD. Patient currently states she has no vaginal discharge at this time. Patient has had multiple episodes of vomiting and nausea during this period patient's bowel movements have been normal. On exam the patient is diffusely tender however greatest tenderness is in the suprapubic region.  Patient's workup as above is unremarkable. Patient has not had exam concerning for PID so will not treat with antibiotics at this time. Patient's  UA is wnl. Patient has had multiple CTs in the past or unremarkable for similar complaints. Patient also had negative ultrasound in the past for similar complaints.   Patient has not had any worsening of abdominal pain, no episodes of emesis. Patient will be given return precautions and is to followup with her PCP.   Renne Musca, MD 03/21/14 2013  Renne Musca, MD 03/21/14 2014

## 2014-03-21 NOTE — Discharge Instructions (Signed)
Dolor abdominal en las mujeres °(Abdominal Pain, Women) °El dolor abdominal (en el estómago, la pelvis o el vientre) puede tener muchas causas. Es importante que le informe a su médico: °· La ubicación del dolor. °· ¿Viene y se va, o persiste todo el tiempo? °· ¿Hay situaciones que inician el dolor (comer ciertos alimentos, la actividad física)? °· ¿Tiene otros síntomas asociados al dolor (fiebre, náuseas, vómitos, diarrea)? °Todo es de gran ayuda cuando se trata de hallar la causa del dolor. °CAUSAS °· Estómago: Infecciones por virus o bacterias, o úlcera. °· Intestino: Apendicitis (apéndice inflamado), ileitis regional (enfermedad de Crohn), colitis ulcerosa (colon inflamado), síndrome del colon irritable, diverticulitis (inflamación de los divertículos del colon) o cáncer de estómago oo intestino. °· Enfermedades de la vesícula biliar o cálculos. °· Enfermedades renales, cálculos o infecciones en el riñón. °· Infección o cáncer del páncreas. °· Fibromialgia (trastorno doloroso) °· Enfermedades de los órganos femeninos: °¨ Uterus: Útero: fibroma (tumor no canceroso) o infección °¨ Trompas de Falopio: infección o embarazo ectópico °¨ En los ovarios, quistes o tumores. °¨ Adherencias pélvicas (tejido cicatrizal). °¨ Endometriosis (el tejido que cubre el útero se desarrolla en la pelvis y los órganos pélvicos). °¨ Síndrome de congestión pélvica (los órganos femeninos se llenan de sangre antes del periodo menstrual( °¨ Dolor durante el periodo menstrual. °¨ Dolor durante la ovulación (al producir óvulos). °¨ Dolor al usar el DIU (dispositivo intrauterino para el control de la natalidad) °¨ Cáncer en los órganos femeninos. °· Dolor funcional (no está originado en una enfermedad, puede mejorar sin tratamiento). °· Dolor de origen psicológico °· Depresión. °DIAGNÓSTICO °Su médico decidirá la gravedad del dolor a través del examen físico °· Análisis de sangre °· Radiografías °· Ecografías °· TC (tomografía computada, tipo  especial de radiografías). °· IMR (resonancia magnética) °· Cultivos, en el caso una infección °· Colon por enema de bario (se inserta una sustancia de contraste en el intestino grueso para mejorar la observación con rayos X.) °· Colonoscopía (observación del intestino con un tubo luminoso). °· Laparoscopía (examen del interior del abdomen con un tubo que tiene una luz). °· Cirugía exploratoria abdominal mayor (se observa el abdomen realizando una gran incisión). °TRATAMIENTO °El tratamiento dependerá de la causa del problema.  °· Muchos de estos casos pueden controlarse y tratarse en casa. °· Medicamentos de venta libre indicados por el médico. °· Medicamentos con receta. °· Antibióticos, en caso de infección °· Píldoras anticonceptivas, en el caso de períodos dolorosos o dolor al ovular. °· Tratamiento hormonal, para la endometriosis °· Inyecciones para bloqueo nervioso selectivo. °· Fisioterapia. °· Antidepresivos. °· Consejos por parte de un psícólogo o psiquiatra. °· Cirugía mayor o menor. °INSTRUCCIONES PARA EL CUIDADO DOMICILIARIO °· No tome ni administre laxantes a menos que se lo haya indicado su médico. °· Tome analgésicos de venta libre sólo si se lo ha indicado el profesional que lo asiste. No tome aspirina, ya que puede causar molestias en el estómago o hemorragias. °· Consuma una dieta líquida (caldo o agua) según lo indicado por el médico. Progrese lentamente a una dieta blanda, según la tolerancia, si el dolor se relaciona con el estómago o el intestino. °· Tenga un termómetro y tómese la temperatura varias veces al día. °· Haga reposo en la cama y duerma, si esto alivia el dolor. °· Evite las relaciones sexuales, si le producen dolor. °· Evite las situaciones estresantes. °· Cumpla con las visitas y los análisis de control, según las indicaciones de su médico. °· Si el dolor   no se alivia con los medicamentos o la cirugía, puede tratar con: °¨ Acupuntura. °¨ Ejercicios de relajación (yoga,  meditación). °¨ Terapia grupal. °¨ Psicoterapia. °SOLICITE ATENCIÓN MÉDICA SI: °· Nota que ciertos alimentos le producen dolor de estómago. °· El tratamiento indicado para realizar en el hogar no le alivia el dolor. °· Necesita analgésicos más fuertes. °· Quiere que le retiren el DIU. °· Si se siente confundido o desfalleciente. °· Presenta náuseas o vómitos. °· Aparece una erupción cutánea. °· Sufre efectos adversos o una reacción alérgica debido a los medicamentos que toma. °SOLICITE ATENCIÓN MÉDICA DE INMEDIATO SI: °· El dolor persiste o se agrava. °· Tiene fiebre. °· Siente el dolor sólo en algunos sectores del abdomen. Si se localiza en la zona derecha, posiblemente podría tratarse de apendicitis. En un adulto, si se localiza en la región inferior izquierda del abdomen, podría tratarse de colitis o diverticulitis. °· Hay sangre en las heces (deposiciones de color rojo brillante o negro alquitranado), con o sin vómitos. °· Usted presenta sangre en la orina. °· Siente escalofríos con o sin fiebre. °· Se desmaya. °ASEGÚRESE QUE:  °· Comprende estas instrucciones. °· Controlará su enfermedad. °· Solicitará ayuda de inmediato si no mejora o si empeora. °Document Released: 09/10/2008 Document Revised: 08/17/2011 °ExitCare® Patient Information ©2015 [employer]. This information is not intended to replace advice given to you by your health care provider. Make sure you discuss any questions you have with your health care provider. ° °

## 2014-03-21 NOTE — ED Provider Notes (Signed)
I have personally seen and examined the patient.  I have discussed the plan of care with the resident.  I have reviewed the documentation on PMH/FH/Soc. History.  I have reviewed the documentation of the resident and agree.     Sharyon Cable, MD 03/21/14 2330

## 2014-03-21 NOTE — ED Provider Notes (Signed)
Patient seen/examined in the Emergency Department in conjunction with Resident Physician Provider Robina Ade Patient reports abdominal pain Exam : awake/alert, mild lower abdominal tenderness, no rebound or guarding Plan: labs pending at this time, likely discharge home   Sharyon Cable, MD 03/21/14 1645

## 2014-03-22 LAB — GC/CHLAMYDIA PROBE AMP
CT Probe RNA: NEGATIVE
GC PROBE AMP APTIMA: NEGATIVE

## 2014-04-11 ENCOUNTER — Ambulatory Visit: Payer: Self-pay

## 2014-07-05 ENCOUNTER — Emergency Department (HOSPITAL_COMMUNITY)
Admission: EM | Admit: 2014-07-05 | Discharge: 2014-07-06 | Disposition: A | Discharge status: Other Acute Inpatient Hospital | Payer: Self-pay | Attending: Emergency Medicine | Admitting: Emergency Medicine

## 2014-07-05 ENCOUNTER — Encounter (HOSPITAL_COMMUNITY): Payer: Self-pay | Admitting: Emergency Medicine

## 2014-07-05 DIAGNOSIS — R51 Headache: Secondary | ICD-10-CM | POA: Insufficient documentation

## 2014-07-05 DIAGNOSIS — F4321 Adjustment disorder with depressed mood: Secondary | ICD-10-CM

## 2014-07-05 DIAGNOSIS — R45851 Suicidal ideations: Secondary | ICD-10-CM | POA: Insufficient documentation

## 2014-07-05 DIAGNOSIS — J45909 Unspecified asthma, uncomplicated: Secondary | ICD-10-CM | POA: Insufficient documentation

## 2014-07-05 DIAGNOSIS — R000 Tachycardia, unspecified: Secondary | ICD-10-CM | POA: Insufficient documentation

## 2014-07-05 LAB — COMPREHENSIVE METABOLIC PANEL
ALBUMIN: 4.2 g/dL (ref 3.5–5.2)
ALT: 14 U/L (ref 0–35)
ANION GAP: 6 (ref 5–15)
AST: 19 U/L (ref 0–37)
Alkaline Phosphatase: 85 U/L (ref 39–117)
BUN: 8 mg/dL (ref 6–23)
CHLORIDE: 115 mmol/L — AB (ref 96–112)
CO2: 21 mmol/L (ref 19–32)
Calcium: 8.6 mg/dL (ref 8.4–10.5)
Creatinine, Ser: 0.41 mg/dL — ABNORMAL LOW (ref 0.50–1.10)
GFR calc Af Amer: >90 mL/min (ref >90)
GLUCOSE: 126 mg/dL — AB (ref 70–99)
Potassium: 3.9 mmol/L (ref 3.5–5.1)
Sodium: 142 mmol/L (ref 135–145)
TOTAL PROTEIN: 7.6 g/dL (ref 6.0–8.3)
Total Bilirubin: 0.8 mg/dL (ref 0.3–1.2)

## 2014-07-05 LAB — CBC WITH DIFFERENTIAL/PLATELET
BASOS PCT: 0 % (ref 0–1)
Basophils Absolute: 0 10*3/uL (ref 0.0–0.1)
EOS ABS: 0.3 10*3/uL (ref 0.0–0.7)
Eosinophils Relative: 3 % (ref 0–5)
HCT: 37.9 % (ref 36.0–46.0)
Hemoglobin: 12.5 g/dL (ref 12.0–15.0)
LYMPHS ABS: 4.1 10*3/uL — AB (ref 0.7–4.0)
LYMPHS PCT: 39 % (ref 12–46)
MCH: 26.5 pg (ref 26.0–34.0)
MCHC: 33 g/dL (ref 30.0–36.0)
MCV: 80.3 fL (ref 78.0–100.0)
MONO ABS: 0.7 10*3/uL (ref 0.1–1.0)
MONOS PCT: 7 % (ref 3–12)
Neutro Abs: 5.5 10*3/uL (ref 1.7–7.7)
Neutrophils Relative %: 51 % (ref 43–77)
Platelets: 258 10*3/uL (ref 150–400)
RBC: 4.72 MIL/uL (ref 3.87–5.11)
RDW: 14.1 % (ref 11.5–15.5)
WBC: 10.7 10*3/uL — ABNORMAL HIGH (ref 4.0–10.5)

## 2014-07-05 LAB — SALICYLATE LEVEL

## 2014-07-05 LAB — POC URINE PREG, ED: PREG TEST UR: NEGATIVE

## 2014-07-05 LAB — ETHANOL: Alcohol, Ethyl (B): 98 mg/dL — ABNORMAL HIGH (ref 0–9)

## 2014-07-05 LAB — ACETAMINOPHEN LEVEL

## 2014-07-05 MED ORDER — METOCLOPRAMIDE HCL 5 MG/ML IJ SOLN
10.0000 mg | Freq: Once | INTRAMUSCULAR | Status: AC
Start: 2014-07-05 — End: 2014-07-05
  Administered 2014-07-05: 10 mg via INTRAVENOUS
  Filled 2014-07-05: qty 2

## 2014-07-05 MED ORDER — DIPHENHYDRAMINE HCL 50 MG/ML IJ SOLN
25.0000 mg | Freq: Once | INTRAMUSCULAR | Status: AC
  Administered 2014-07-05: 25 mg via INTRAVENOUS
  Filled 2014-07-05: qty 1

## 2014-07-05 MED ORDER — KETOROLAC TROMETHAMINE 30 MG/ML IJ SOLN
30.0000 mg | Freq: Once | INTRAMUSCULAR | Status: AC
  Administered 2014-07-05: 30 mg via INTRAVENOUS
  Filled 2014-07-05: qty 1

## 2014-07-05 MED ORDER — DEXAMETHASONE SODIUM PHOSPHATE 10 MG/ML IJ SOLN
10.0000 mg | Freq: Once | INTRAMUSCULAR | Status: AC
  Administered 2014-07-05: 10 mg via INTRAVENOUS
  Filled 2014-07-05: qty 1

## 2014-07-05 NOTE — ED Notes (Signed)
Pt c/o headache x two days. Pt has hx of the same. Pt tearful. Pt c/o light sensitivity. Denies N/V, vision changes, lightheadedness or dizziness.

## 2014-07-05 NOTE — ED Notes (Signed)
MD went to speak with patient about how she was feeling, pt admitted that she is feeling suicidal and took "a lot" of tylenol last night. Pt sts she is separated from her husband.

## 2014-07-05 NOTE — ED Notes (Addendum)
Patient states she has had a headache x 2 days. Denies N/V/D. Exacerbated/helped by nothing. Alert and oriented.

## 2014-07-05 NOTE — ED Notes (Signed)
Pt belongings behind nursing station in front of Res area.

## 2014-07-05 NOTE — ED Provider Notes (Signed)
CSN: [id number]     Arrival date & time 07/05/14  1959 History   First MD Initiated Contact with Patient 07/05/14 2024     Chief Complaint  Patient presents with  . Headache     (Consider location/radiation/quality/duration/timing/severity/associated sxs/prior Treatment) Patient is a 39 y.o. female presenting with headaches. The history is provided by the patient.  Headache Pain location:  Generalized Quality:  Dull Radiates to:  Does not radiate Severity currently:  10/10 Severity at highest:  10/10 Onset quality:  Gradual Duration:  3 days Timing:  Constant Progression:  Worsening Chronicity:  Recurrent Similar to prior headaches: yes   Context: bright light   Relieved by:  Nothing Worsened by:  Light Ineffective treatments:  Acetaminophen Associated symptoms: photophobia   Associated symptoms: no abdominal pain, no blurred vision, no cough, no fever, no focal weakness, no nausea, no neck stiffness, no seizures, no URI, no visual change and no vomiting     Past Medical History  Diagnosis Date  . History of bronchitis     "I've stayed in hospital 2 X w/bronchitis"  . Asthma   . History of blood transfusion 1999    w/vaginal delivery   Past Surgical History  Procedure Laterality Date  . Cholecystectomy  2011  . Cesarean section  1997; 2008  . Tubal ligation  04/2007   Family History  Problem Relation Age of Onset  . Diabetes Mother   . Osteoarthritis Mother   . Hypertension Mother    History  Substance Use Topics  . Smoking status: Never Smoker   . Smokeless tobacco: Never Used  . Alcohol Use: No   OB History    No data available     Review of Systems  Constitutional: Negative for fever.  Eyes: Positive for photophobia. Negative for blurred vision.  Respiratory: Negative for cough.   Gastrointestinal: Negative for nausea, vomiting and abdominal pain.  Musculoskeletal: Negative for neck stiffness.  Neurological: Positive for headaches. Negative for  focal weakness and seizures.  All other systems reviewed and are negative.     Allergies  Review of patient's allergies indicates no known allergies.  Home Medications   Prior to Admission medications   Medication Sig Start Date End Date Taking? Authorizing Provider  albuterol (PROVENTIL HFA;VENTOLIN HFA) 108 (90 BASE) MCG/ACT inhaler Inhale 2 puffs into the lungs every 6 (six) hours as needed for wheezing. 10/27/13  Yes Lance Bosch, NP  ibuprofen (ADVIL,MOTRIN) 200 MG tablet Take 800 mg by mouth every 6 (six) hours as needed for headache.   Yes Historical Provider, MD  ondansetron (ZOFRAN ODT) 4 MG disintegrating tablet Take 1 tablet (4 mg total) by mouth every 8 (eight) hours as needed for nausea or vomiting. Patient not taking: Reported on 07/05/2014 03/21/14   Renne Musca, MD   BP 159/89 mmHg  Pulse 109  Temp(Src) 98.3 F (36.8 C) (Oral)  SpO2 100%  LMP 06/14/2014 (Approximate) Physical Exam  Constitutional: She is oriented to person, place, and time. She appears well-developed and well-nourished. She appears distressed.  Tearful on exam  HENT:  Head: Normocephalic and atraumatic.  Eyes: EOM are normal. Pupils are equal, round, and reactive to light.  Fundoscopic exam:      The right eye shows no papilledema.       The left eye shows no papilledema.  Neck: Normal range of motion. Neck supple.  Cardiovascular: Regular rhythm, normal heart sounds and intact distal pulses.  Tachycardia present.  Exam reveals no friction rub.  No murmur heard. Pulmonary/Chest: Effort normal and breath sounds normal. She has no wheezes. She has no rales.  Abdominal: Soft. Bowel sounds are normal. She exhibits no distension. There is no tenderness. There is no rebound and no guarding.  Musculoskeletal: Normal range of motion. She exhibits no tenderness.  No edema  Lymphadenopathy:    She has no cervical adenopathy.  Neurological: She is alert and oriented to person, place, and time. She has  normal strength. No cranial nerve deficit or sensory deficit. Gait normal.  photophobia  Skin: Skin is warm and dry. No rash noted.  Psychiatric: She has a normal mood and affect. Her behavior is normal.  Nursing note and vitals reviewed.   ED Course  Procedures (including critical care time) Labs Review Labs Reviewed  CBC WITH DIFFERENTIAL/PLATELET - Abnormal; Notable for the following:    WBC 10.7 (*)    Lymphs Abs 4.1 (*)    All other components within normal limits  COMPREHENSIVE METABOLIC PANEL  URINALYSIS, ROUTINE W REFLEX MICROSCOPIC  URINE RAPID DRUG SCREEN (HOSP PERFORMED)  ETHANOL  ACETAMINOPHEN LEVEL  SALICYLATE LEVEL  POC URINE PREG, ED    Imaging Review No results found.   EKG Interpretation None      MDM   Final diagnoses:  None    Pt with symptoms concerning for migraine HA without sx suggestive of SAH(sudden onset, worst of life, or deficits), infection, or cavernous vein thrombosis.  Normal neuro exam and vital signs. Will give HA cocktail and on re-eval.   10:59 PM On reevaluation patient states her headache is somewhat better however with further probing patient became tearful and admits that she has tried to kill herself multiple times by taking pills. She feels very stressed and suicidal because she has recently been separated from her husband and she is very sad. The last time she attempted to kill herself this by taking Tylenol and Advil last night and she does not know how much she took.  Psych labs as well as Tylenol level and liver function tests. If those are all normal will have TTS evaluate. Labs wnl.  Pt cleared for TTS  Blanchie Dessert, MD 07/08/14 1715

## 2014-07-06 ENCOUNTER — Encounter (HOSPITAL_COMMUNITY): Payer: Self-pay

## 2014-07-06 ENCOUNTER — Inpatient Hospital Stay (HOSPITAL_COMMUNITY)
Admission: EM | Admit: 2014-07-06 | Discharge: 2014-07-09 | DRG: 885 | Disposition: A | Discharge status: Home / Self Care | Payer: Federal, State, Local not specified - Other | Source: Intra-hospital | Attending: Psychiatry | Admitting: Psychiatry

## 2014-07-06 DIAGNOSIS — Z833 Family history of diabetes mellitus: Secondary | ICD-10-CM

## 2014-07-06 DIAGNOSIS — F333 Major depressive disorder, recurrent, severe with psychotic symptoms: Principal | ICD-10-CM | POA: Diagnosis present

## 2014-07-06 DIAGNOSIS — Z818 Family history of other mental and behavioral disorders: Secondary | ICD-10-CM

## 2014-07-06 DIAGNOSIS — F410 Panic disorder [episodic paroxysmal anxiety] without agoraphobia: Secondary | ICD-10-CM | POA: Diagnosis present

## 2014-07-06 DIAGNOSIS — G4700 Insomnia, unspecified: Secondary | ICD-10-CM | POA: Diagnosis present

## 2014-07-06 DIAGNOSIS — K5900 Constipation, unspecified: Secondary | ICD-10-CM | POA: Diagnosis present

## 2014-07-06 DIAGNOSIS — F32A Depression, unspecified: Secondary | ICD-10-CM | POA: Diagnosis present

## 2014-07-06 DIAGNOSIS — K219 Gastro-esophageal reflux disease without esophagitis: Secondary | ICD-10-CM | POA: Diagnosis present

## 2014-07-06 DIAGNOSIS — Z8249 Family history of ischemic heart disease and other diseases of the circulatory system: Secondary | ICD-10-CM | POA: Diagnosis not present

## 2014-07-06 DIAGNOSIS — J45909 Unspecified asthma, uncomplicated: Secondary | ICD-10-CM | POA: Diagnosis present

## 2014-07-06 DIAGNOSIS — F329 Major depressive disorder, single episode, unspecified: Secondary | ICD-10-CM | POA: Diagnosis present

## 2014-07-06 DIAGNOSIS — F419 Anxiety disorder, unspecified: Secondary | ICD-10-CM | POA: Diagnosis present

## 2014-07-06 DIAGNOSIS — R45851 Suicidal ideations: Secondary | ICD-10-CM

## 2014-07-06 LAB — RAPID URINE DRUG SCREEN, HOSP PERFORMED
AMPHETAMINES: NOT DETECTED
BENZODIAZEPINES: NOT DETECTED
Barbiturates: NOT DETECTED
Cocaine: NOT DETECTED
Opiates: NOT DETECTED
TETRAHYDROCANNABINOL: NOT DETECTED

## 2014-07-06 LAB — URINALYSIS, ROUTINE W REFLEX MICROSCOPIC
Bilirubin Urine: NEGATIVE
Glucose, UA: NEGATIVE mg/dL
HGB URINE DIPSTICK: NEGATIVE
Ketones, ur: NEGATIVE mg/dL
Leukocytes, UA: NEGATIVE
NITRITE: NEGATIVE
Protein, ur: NEGATIVE mg/dL
Specific Gravity, Urine: 1.013 (ref 1.005–1.030)
Urobilinogen, UA: 1 mg/dL (ref 0.0–1.0)
pH: 7 (ref 5.0–8.0)

## 2014-07-06 MED ORDER — INFLUENZA VAC SPLIT QUAD 0.5 ML IM SUSY
0.5000 mL | PREFILLED_SYRINGE | INTRAMUSCULAR | Status: AC
  Administered 2014-07-07: 0.5 mL via INTRAMUSCULAR
  Filled 2014-07-06: qty 0.5

## 2014-07-06 MED ORDER — FLUOXETINE HCL 20 MG PO CAPS
20.0000 mg | ORAL_CAPSULE | Freq: Every day | ORAL | Status: DC
  Administered 2014-07-06 – 2014-07-09 (×4): 20 mg via ORAL
  Filled 2014-07-06: qty 1
  Filled 2014-07-06: qty 14
  Filled 2014-07-06 (×5): qty 1

## 2014-07-06 MED ORDER — MAGNESIUM HYDROXIDE 400 MG/5ML PO SUSP
30.0000 mL | Freq: Every day | ORAL | Status: DC | PRN
  Administered 2014-07-06 – 2014-07-07 (×2): 30 mL via ORAL
  Filled 2014-07-06 (×2): qty 30

## 2014-07-06 MED ORDER — MIRTAZAPINE 15 MG PO TABS
15.0000 mg | ORAL_TABLET | Freq: Every day | ORAL | Status: DC
  Administered 2014-07-06 – 2014-07-08 (×3): 15 mg via ORAL
  Filled 2014-07-06: qty 1
  Filled 2014-07-06: qty 14
  Filled 2014-07-06 (×3): qty 1

## 2014-07-06 MED ORDER — TRAMADOL HCL 50 MG PO TABS
50.0000 mg | ORAL_TABLET | Freq: Once | ORAL | Status: AC
  Administered 2014-07-06: 50 mg via ORAL
  Filled 2014-07-06: qty 1

## 2014-07-06 MED ORDER — TRAZODONE HCL 50 MG PO TABS: 50.0000 mg | ORAL_TABLET | Freq: Every evening | ORAL | Status: DC | PRN

## 2014-07-06 MED ORDER — ALUM & MAG HYDROXIDE-SIMETH 200-200-20 MG/5ML PO SUSP: 30.0000 mL | ORAL | Status: DC | PRN

## 2014-07-06 MED ORDER — ACETAMINOPHEN 325 MG PO TABS
650.0000 mg | ORAL_TABLET | Freq: Four times a day (QID) | ORAL | Status: DC | PRN
  Administered 2014-07-06 (×2): 650 mg via ORAL
  Filled 2014-07-06 (×3): qty 2

## 2014-07-06 MED ORDER — IBUPROFEN 400 MG PO TABS
400.0000 mg | ORAL_TABLET | Freq: Four times a day (QID) | ORAL | Status: DC | PRN
  Administered 2014-07-06 – 2014-07-09 (×5): 400 mg via ORAL
  Filled 2014-07-06 (×5): qty 1

## 2014-07-06 MED ORDER — RISPERIDONE 2 MG PO TABS
2.0000 mg | ORAL_TABLET | Freq: Every day | ORAL | Status: DC
  Administered 2014-07-06 – 2014-07-08 (×3): 2 mg via ORAL
  Filled 2014-07-06 (×3): qty 1
  Filled 2014-07-06: qty 14
  Filled 2014-07-06: qty 1

## 2014-07-06 MED ORDER — HYDROXYZINE HCL 25 MG PO TABS
25.0000 mg | ORAL_TABLET | Freq: Four times a day (QID) | ORAL | Status: DC | PRN
  Administered 2014-07-06 – 2014-07-08 (×4): 25 mg via ORAL
  Filled 2014-07-06 (×4): qty 1

## 2014-07-06 NOTE — Tx Team (Signed)
Interdisciplinary Treatment Plan Update (Adult) Date: 07/06/2014   Time Reviewed: 9:30 AM  Progress in Treatment: Attending groups: Continuing to assess, patient new to milieu Participating in groups: Continuing to assess, patient new to milieu Taking medication as prescribed: Yes Tolerating medication: Yes Family/Significant other contact made: No, CSW assessing for appropriate contacts Patient understands diagnosis: Yes Discussing patient identified problems/goals with staff: Yes Medical problems stabilized or resolved: Yes Denies suicidal/homicidal ideation: Yes Issues/concerns per patient self-inventory: Yes Other:  New problem(s) identified: N/A  Discharge Plan or Barriers:  1/29: Patient plans to return to her apartment at discharge and is agreeable to follow up with [facility] [place] at discharge.  Reason for Continuation of Hospitalization:  Depression Anxiety Medication Stabilization   Comments: N/A  Estimated length of stay: 2-3 days  For review of initial/current patient goals, please see plan of care. Patient is a 39 year old Hispanic female admitted with SI and reports overdosing on Tylenol recently. Patient will benefit from crisis stabilization, medication evaluation, group therapy, and psycho education in addition to case management for discharge planning. Patient and CSW reviewed pt's identified goals and treatment plan. Pt verbalized understanding and agreed to treatment plan.   Attendees:  Patient:  07/06/2014 8:43 AM   Signature: Gabriel Earing, MD 07/06/2014 8:43 AM  Signature: Carlton Adam, MD 07/06/2014 8:43 AM  Signature: Para March, RN 07/06/2014 8:43 AM  Signature: Desma Paganini, RN 07/06/2014 8:43 AM  Signature: Marcello Moores, RN 07/06/2014 8:43 AM  Signature: Joette Catching, LCSW 07/06/2014 8:43 AM  Signature: Erasmo Downer Makylah Bossard, LCSW-A 07/06/2014 8:43 AM  Signature: Lucinda Dell, Care Coordinator [facility]  07/06/2014 8:43 AM  Signature:  07/06/2014 8:43 AM  Signature:  07/06/2014 8:43 AM  Signature: Lars Pinks, RN [facility] 07/06/2014 8:43 AM  Signature: 07/06/2014 8:43 AM    Scribe for Treatment Team:  Tilden Fossa, MSW, [place]
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
## 2014-07-06 NOTE — BHH Group Notes (Signed)
   [facility] LCSW Aftercare Discharge Planning Group Note  07/06/2014  8:45 AM   Patient attended but was unable to participate at this time due to lack of interpreter.  Tilden Fossa, MSW, [place] Worker [facility] [phone]

## 2014-07-06 NOTE — Progress Notes (Signed)
Writer removed 2 sets of keys and a Note 5 Cell phone from pt's locker, per her request. Interpretor present during pt's verbal request. Pt requested for writer to hand over these items to her husband. Evan the Animal nutritionist was present during this writer's removal of the above items. Items were given to pt's husband Claudie Fisherman) at 2020 on 07/06/14.

## 2014-07-06 NOTE — ED Notes (Signed)
Transported to [facility] in stable condition with all belongings and belongings from safe per Betsy Pries

## 2014-07-06 NOTE — Plan of Care (Signed)
Problem: Diagnosis: Increased Risk For Suicide Attempt Goal: STG-Patient Will Report Suicidal Feelings to Staff Outcome: Progressing Pt does admit to some passive S/I no plans here in the hospital and can contract verbally to talk with staff before acting on any self-harm thoughts.

## 2014-07-06 NOTE — BHH Group Notes (Signed)
[facility] LCSW Group Therapy 07/06/2014 1:15 PM Type of Therapy: Group Therapy Participation Level: Active  Participation Quality: Attentive, Sharing and Supportive  Affect: Depressed and Flat  Cognitive: Alert and Oriented  Insight: Developing/Improving and Engaged  Engagement in Therapy: Developing/Improving and Engaged  Modes of Intervention: Clarification, Confrontation, Discussion, Education, Exploration, Limit-setting, Orientation, Problem-solving, Rapport Building, Art therapist, Socialization and Support  Summary of Progress/Problems: The topic for today was feelings about relapse. Pt discussed what relapse prevention is to them and identified triggers that they are on the path to relapse. Pt processed their feeling towards relapse and was able to relate to peers. Pt discussed coping skills that can be used for relapse prevention. Patient participated in group with the assistance of interpreter Marlene [place] ([place]). Patient identified her relapse behavior as her depression. She reports missing her children as a primary stressor. CSW and other group members provided emotional support and encouragement.   Tilden Fossa, MSW, [place] Worker [facility] [phone]

## 2014-07-06 NOTE — BHH Counselor (Signed)
Per Glenda Chroman, NP, pt meets inpt criteria.   Per Mechele Claude [facility]), pt accepted to [facility] Bed 402-1 by [place]. Pt notified of disposition and all support paperwork completed. Attending RN ([place]) also notified of disposition.

## 2014-07-06 NOTE — Clinical Social Work Note (Signed)
CSW requested interpreter for patient's course of hospitalization from Interpreting Services Dept. [id number].  Tilden Fossa, MSW, [place] Worker [facility] [phone]

## 2014-07-06 NOTE — Tx Team (Signed)
Initial Interdisciplinary Treatment Plan   PATIENT STRESSORS: Marital or family conflict   PATIENT STRENGTHS: Active sense of humor General fund of knowledge Motivation for treatment/growth Supportive family/friends   PROBLEM LIST: Problem List/Patient Goals Date to be addressed Date deferred Reason deferred Estimated date of resolution  Risk for suicide      "want to work on Myself, feel good , be good person for my kids"      psychosis                                           DISCHARGE CRITERIA:  Improved stabilization in mood, thinking, and/or behavior Verbal commitment to aftercare and medication compliance  PRELIMINARY DISCHARGE PLAN: Attend aftercare/continuing care group Outpatient therapy  PATIENT/FAMIILY INVOLVEMENT: This treatment plan has been presented to and reviewed with the patient, Noretta Frier.  The patient and family have been given the opportunity to ask questions and make suggestions.  Vonzella Nipple A 07/06/2014, 5:15 AM

## 2014-07-06 NOTE — BHH Suicide Risk Assessment (Signed)
[facility] Admission Suicide Risk Assessment   Nursing information obtained from:    Demographic factors:   39 year old female, separated, employed- Paediatric nurse only Current Mental Status:   See below Loss Factors:   Separation, not seeing her children often, acculturation issues/stressors Historical Factors:   long history of depression Risk Reduction Factors:   motivated in treatment , employed  Total Time spent with patient: 30 minutes Principal Problem: worsening depression  Diagnosis:   Patient Active Problem List   Diagnosis Date Noted  . Depressive disorder [F32.9] 07/06/2014  . Screening for malignant neoplasm of the cervix [Z12.4] 01/03/2013  . GASTROESOPHAGEAL REFLUX DISEASE [K21.9] 08/14/2009  . ETHMOIDAL SINUSITIS, ACUTE [J01.20] 05/14/2009  . DEPRESSION, SITUATIONAL, PROLONGED [F43.21] 02/05/2009  . UNSPECIFIED VISUAL LOSS [H54.7] 11/27/2008  . CHALAZION, LEFT [H00.19] 11/27/2008  . OBESITY [E66.9] 05/30/2007  . IRREGULAR MENSTRUAL CYCLE [N92.6] 05/30/2007  . ASTHMA, PERSISTENT [J45.909] 12/06/2006     Continued Clinical Symptoms:  Alcohol Use Disorder Identification Test Final Score (AUDIT): 3 The "Alcohol Use Disorders Identification Test", Guidelines for Use in Primary Care, Second Edition.  World Pharmacologist [facility]). Score between 0-7:  no or low risk or alcohol related problems. Score between 8-15:  moderate risk of alcohol related problems. Score between 16-19:  high risk of alcohol related problems. Score 20 or above:  warrants further diagnostic evaluation for alcohol dependence and treatment.   CLINICAL FACTORS:  Patient progressively more depressed, sad, in the context of marital separation and not having her children with her- they are currently with her husband. Has developed psychotic symptoms and recent suicidal ideations   Musculoskeletal: Strength & Muscle Tone: within normal limits Gait & Station: normal Patient leans: N/A  Psychiatric  Specialty Exam: Physical Exam  ROS  Blood pressure 136/69, pulse 72, temperature 98.1 F (36.7 C), temperature source Oral, resp. rate 20, height 4\' 11"  (1.499 m), weight 204 lb (92.534 kg), last menstrual period 06/14/2014.Body mass index is 41.18 kg/(m^2).  SEE ADMIT NOTE MSE                                                        COGNITIVE FEATURES THAT CONTRIBUTE TO RISK:  Closed-mindedness    SUICIDE RISK:   Moderate:  Frequent suicidal ideation with limited intensity, and duration, some specificity in terms of plans, no associated intent, good self-control, limited dysphoria/symptomatology, some risk factors present, and identifiable protective factors, including available and accessible social support.  PLAN OF CARE: Patient will be admitted to inpatient psychiatric unit for stabilization and safety. Will provide and encourage milieu participation. Provide medication management and maked adjustments as needed.  Will follow daily.    Medical Decision Making:  Review of Psycho-Social Stressors (1), Review or order clinical lab tests (1), Established Problem, Worsening (2) and Review of Medication Regimen & Side Effects (2)  I certify that inpatient services furnished can reasonably be expected to improve the patient's condition.   Dejha King, [place] 07/06/2014, 1:41 PM

## 2014-07-06 NOTE — Clinical Social Work Note (Signed)
Interpreting services with [place] at [phone]. Please call if the interpreter is late and begin using the language line [phone] access code [phone].   Alexandra Norris, [place]

## 2014-07-06 NOTE — Progress Notes (Signed)
Interpretor  Line was used to conduct the admission.  Admission Note:  D:38 yr female who presents VC in no acute distress for the treatment of SI and psychosis. Pt appears flat and depressed. Pt was calm and cooperative with admission process. Pt endorses AH- telling her that she is a bad person, she doesn't deserve to live.  Pt presents with passive SI and contracts for safety upon admission. Pt denies VH . Pt stated  "If I didn't come here I would die". Pt has 3 kids 1, 67, 68, and one granddaughter. Pt stays with her boyfriend in their apartment.   A:Skin was assessed and found to be clear of any abnormal marks apart from wounds on her fingers from her biting her skin too low. Tattoo on R-wrist. POC and unit policies explained and understanding verbalized. Consents obtained. Food and fluids offered, and fluids accepted.  R: Pt had no additional questions or concerns.
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
## 2014-07-06 NOTE — Progress Notes (Signed)
D:Pt presents anxious in affect and mood. Pt verbally endorses anxiety and is requesting her prn Vistaril. Pt also complains of a headache at level of 8 out 10 and constipation.  A: Writer administered scheduled and prn medications to pt, per MD orders. Writer informed pt that she may not see immediate results from the milk of mg. Continued support and availability as needed was extended to this pt. Staff continue to monitor pt with q98min checks.  R: No adverse drug reactions noted. Pt receptive to treatment. Pt remains safe at this time. Headache at a level 2 out of 10 on follow-up. Pt appears less anxious (smiling and laughing with Probation officer).

## 2014-07-06 NOTE — Progress Notes (Signed)
Pt has been up and active in the milieu today.  She has had two different interpreters today to assist with assessing pt and for her group therapy. Pt admits she understands more English than she can speak.  She stated,"I have been living in the [place] for past 21 years".  She was started on prozac and will be given risperdal tonight to help with the voices pt reports she is hearing. She stated,"they call me a bad person and I need to die" she was given information on her medications in spanish.  She rated all her depression hopelessness and anxiety a 10 on her self-inventory.  She was given prn vistaril around 0845 this morning she reported relief.  She also had ibuprofen 400 mg around 1503 for her headache which did relieve.  Pt is suppose to have an interpreter for tonight and this weekend see sticky note on treatment team notes.

## 2014-07-07 DIAGNOSIS — F329 Major depressive disorder, single episode, unspecified: Secondary | ICD-10-CM

## 2014-07-07 DIAGNOSIS — G4700 Insomnia, unspecified: Secondary | ICD-10-CM

## 2014-07-07 LAB — TSH: TSH: 2.685 u[IU]/mL (ref 0.350–4.500)

## 2014-07-07 NOTE — [facility] Group Notes (Signed)
[place] Group Notes:  (Clinical Social Work)  07/07/2014     10-11AM  Summary of Progress/Problems:   The main focus of today's process group was to learn how to use a decisional balance exercise to move forward in the Stages of Change, which were described and discussed.  Motivational Interviewing and a worksheet were utilized to help patients explore in depth the perceived benefits and costs of a self-sabotaging behavior, as well as the  benefits and costs of replacing that with a healthy coping mechanism.   The patient expressed through her interpreter that she wants a love life, and that she wants to change her security situation.  There was little time in group, so she was not specifically called on again, but did listen attentively to her interpreter interpret everything that was said throughout the group.  Type of Therapy:  Group Therapy - Process   Participation Level:  Active  Participation Quality:  Attentive  Affect:  Blunted  Cognitive:  Alert  Insight:  Developing/Improving  Engagement in Therapy:  Developing/Improving  Modes of Intervention:  Education, Motivational Interviewing  Selmer Dominion, LCSW 07/07/2014, 2:38 PM

## 2014-07-07 NOTE — Progress Notes (Signed)
D: Pt presents with congruent affect and calm mood at present. Visible in dayroom with assigned interpreter for scheduled groups on unit (RN & SW). Pt reported poor concentration level; rated her depression, anxiety and hopelessness  8/10 on inventory sheet. A: Pt was given all scheduled and PRN medications (Vitaril and MOM) as per MD's orders. Safety maintained on Q 15 minutes checks as per order without gestures or event of self harm to note at this time.  R: Pt is receptive to care. Compliant with current treatment regimen. Denied SI /HI when assessed. Contracts for safety while hospitalized. [name] did report AH earlier this AM, stated it happened upon waking up, reported the voices were telling her "that's why you're here, because you're crazy, you belong here".  She reported blocking them out. Pt was able to request and meet her needs safely this shift.

## 2014-07-07 NOTE — [facility] Group Notes (Signed)
[place] Group Notes:  (Nursing/MHT/Case Management/Adjunct)  Date:  07/07/2014  Time:  9:30am  Type of Therapy:  Nurse Education - Goal Setting/Coping Skills  Participation Level:  Active   Participation Quality:  Attentive  Affect:  Appropriate  Cognitive:  Alert  Insight:  Appropriate  Engagement in Group:  Engaged  Modes of Intervention:  Discussion, Education and Support  Summary of Progress/Problems: Via interpreter, discussed goal setting and reviewed the SMART process for goals. Also reviewed healthy coping skills and tools for success as applied to a real life problem solving scenario.     Jamie Kato 07/07/2014, 4:03 PM

## 2014-07-07 NOTE — Progress Notes (Signed)
D.  Pt pleasant on approach, Interpreter present.  Pt denied complaints other than continued constipation.  Positive for evening wrap up group, appropriate interaction on unit.  Denies SI/HI/ some auditory hallucinations continue to be voiced.  A.  Support and encouragement offered, will ask if mag citrate can be ordered for morning for constipation.  R.  Pt remains safe on unit, no acute distress noted.  Will continue to monitor.

## 2014-07-07 NOTE — Plan of Care (Signed)
Problem: Ineffective individual coping Goal: STG: Patient will remain free from self harm Outcome: Progressing Pt maintained on Q 15 minutes checks as per order without gestures or event to harm self so far this shift.

## 2014-07-07 NOTE — Progress Notes (Signed)
[place] Group Notes:  (Nursing/MHT/Case Management/Adjunct)  Date:  07/07/2014  Time:  2030  Type of Therapy:  wrap up group  Participation Level:  Minimal  Participation Quality:  Appropriate, Attentive and Supportive  Affect:  Appropriate  Cognitive:  Appropriate  Insight:  Lacking  Engagement in Group:  Engaged  Modes of Intervention:  Clarification, Education and Support  Summary of Progress/Problems: Pt reported through her translator that she was happy that she got to talk to her 106 year old son today. Pt only shared that she would like to go home and be a good mother to her son.   Jacques Navy 07/07/2014, 10:10 PM

## 2014-07-07 NOTE — Progress Notes (Signed)
[facility] MD Progress Note  07/07/2014 8:42 PM Alexandra Norris  MRN:  [id number] Subjective:  Admits that she continues to feel very sad. States the fact that her older kids do not want anything to do with her is very painful. States that he left her husband as he drank and was abusive but she always kept this from her kids. So when she left she was the "bad one." she states she has gotten increasingly more depressed to a point she at times would rather be dead, thinks it would be better Principal Problem: <principal problem not specified> Diagnosis:   Patient Active Problem List   Diagnosis Date Noted  . Depressive disorder [F32.9] 07/06/2014  . Screening for malignant neoplasm of the cervix [Z12.4] 01/03/2013  . GASTROESOPHAGEAL REFLUX DISEASE [K21.9] 08/14/2009  . ETHMOIDAL SINUSITIS, ACUTE [J01.20] 05/14/2009  . DEPRESSION, SITUATIONAL, PROLONGED [F43.21] 02/05/2009  . UNSPECIFIED VISUAL LOSS [H54.7] 11/27/2008  . CHALAZION, LEFT [H00.19] 11/27/2008  . OBESITY [E66.9] 05/30/2007  . IRREGULAR MENSTRUAL CYCLE [N92.6] 05/30/2007  . ASTHMA, PERSISTENT [J45.909] 12/06/2006   Total Time spent with patient: 30 minutes   Past Medical History:  Past Medical History  Diagnosis Date  . History of bronchitis     "I've stayed in hospital 2 X w/bronchitis"  . Asthma   . History of blood transfusion 1999    w/vaginal delivery    Past Surgical History  Procedure Laterality Date  . Cholecystectomy  2011  . Cesarean section  1997; 2008  . Tubal ligation  04/2007   Family History:  Family History  Problem Relation Age of Onset  . Diabetes Mother   . Osteoarthritis Mother   . Hypertension Mother    Social History:  History  Alcohol Use No     History  Drug Use No    History   Social History  . Marital Status: Married    Spouse Name: N/A    Number of Children: N/A  . Years of Education: N/A   Social History Main Topics  . Smoking status: Never Smoker   . Smokeless tobacco:  Never Used  . Alcohol Use: No  . Drug Use: No  . Sexual Activity: Yes    Birth Control/ Protection: None   Other Topics Concern  . None   Social History Narrative   Additional History:    Sleep: Poor  Appetite:  Fair   Assessment:   Musculoskeletal: Strength & Muscle Tone: within normal limits Gait & Station: normal Patient leans: N/A   Psychiatric Specialty Exam: Physical Exam  Review of Systems  Constitutional: Negative.   HENT: Negative.   Eyes: Negative.   Respiratory: Negative.   Cardiovascular: Negative.   Gastrointestinal: Negative.   Genitourinary: Negative.   Musculoskeletal: Negative.   Skin: Negative.   Neurological: Negative.   Endo/Heme/Allergies: Negative.   Psychiatric/Behavioral: Positive for depression and suicidal ideas. The patient is nervous/anxious and has insomnia.     Blood pressure 107/82, pulse 109, temperature 97.7 F (36.5 C), temperature source Oral, resp. rate 15, height 4' 11" (1.499 m), weight 92.534 kg (204 lb), last menstrual period 06/14/2014.Body mass index is 41.18 kg/(m^2).  General Appearance: Fairly Groomed  Engineer, water::  Fair  Speech:  Clear and Coherent  Volume:  Decreased  Mood:  Depressed  Affect:  Depressed and Tearful  Thought Process:  Coherent and Goal Directed  Orientation:  Full (Time, Place, and Person)  Thought Content:  events symptoms worries concerns  Suicidal Thoughts:  Yes.  without intent/plan  Homicidal Thoughts:  No  Memory:  Immediate;   Fair Recent;   Fair Remote;   Fair  Judgement:  Fair  Insight:  Present  Psychomotor Activity:  Decreased  Concentration:  Fair  Recall:  [employer] of Knowledge:Fair  Language: Fair  Akathisia:  No  Handed:  Right  AIMS (if indicated):     Assets:  Desire for Improvement Housing Vocational/Educational  ADL's:  Intact  Cognition: WNL  Sleep:  Number of Hours: 0     Current Medications: Current Facility-Administered Medications  Medication Dose  Route Frequency Provider Last Rate Last Dose  . acetaminophen (TYLENOL) tablet 650 mg  650 mg Oral Q6H PRN Malena Peer, NP   650 mg at 07/06/14 2026  . alum & mag hydroxide-simeth (MAALOX/MYLANTA) 200-200-20 MG/5ML suspension 30 mL  30 mL Oral Q4H PRN Evanna Glenda Chroman, NP      . FLUoxetine (PROZAC) capsule 20 mg  20 mg Oral Daily Jenne Campus, MD   20 mg at 07/07/14 0813  . hydrOXYzine (ATARAX/VISTARIL) tablet 25 mg  25 mg Oral Q6H PRN Elmarie Shiley, NP   25 mg at 07/07/14 1004  . ibuprofen (ADVIL,MOTRIN) tablet 400 mg  400 mg Oral Q6H PRN Jenne Campus, MD   400 mg at 07/07/14 1850  . magnesium hydroxide (MILK OF MAGNESIA) suspension 30 mL  30 mL Oral Daily PRN Malena Peer, NP   30 mL at 07/07/14 1314  . mirtazapine (REMERON) tablet 15 mg  15 mg Oral QHS Jenne Campus, MD   15 mg at 07/06/14 2213  . risperiDONE (RISPERDAL) tablet 2 mg  2 mg Oral QHS Jenne Campus, MD   2 mg at 07/06/14 2213    Lab Results:  Results for orders placed or performed during the hospital encounter of 07/05/14 (from the past 48 hour(s))  Comprehensive metabolic panel     Status: Abnormal   Collection Time: 07/05/14 11:10 PM  Result Value Ref Range   Sodium 142 135 - 145 mmol/L   Potassium 3.9 3.5 - 5.1 mmol/L   Chloride 115 (H) 96 - 112 mmol/L   CO2 21 19 - 32 mmol/L   Glucose, Bld 126 (H) 70 - 99 mg/dL   BUN 8 6 - 23 mg/dL   Creatinine, Ser 0.41 (L) 0.50 - 1.10 mg/dL   Calcium 8.6 8.4 - 10.5 mg/dL   Total Protein 7.6 6.0 - 8.3 g/dL   Albumin 4.2 3.5 - 5.2 g/dL   AST 19 0 - 37 U/L   ALT 14 0 - 35 U/L   Alkaline Phosphatase 85 39 - 117 U/L   Total Bilirubin 0.8 0.3 - 1.2 mg/dL   GFR calc non Af Amer >90 >90 mL/min   GFR calc Af Amer >90 >90 mL/min    Comment: (NOTE) The eGFR has been calculated using the CKD EPI equation. This calculation has not been validated in all clinical situations. eGFR's persistently <90 mL/min signify possible Chronic Kidney Disease.    Anion gap  6 5 - 15  Ethanol     Status: Abnormal   Collection Time: 07/05/14 11:10 PM  Result Value Ref Range   Alcohol, Ethyl (B) 98 (H) 0 - 9 mg/dL    Comment:        LOWEST DETECTABLE LIMIT FOR SERUM ALCOHOL IS 11 mg/dL FOR MEDICAL PURPOSES ONLY   Acetaminophen level     Status: Abnormal   Collection Time: 07/05/14 11:10 PM  Result Value  Ref Range   Acetaminophen (Tylenol), Serum <10.0 (L) 10 - 30 ug/mL    Comment:        THERAPEUTIC CONCENTRATIONS VARY SIGNIFICANTLY. A RANGE OF 10-30 ug/mL MAY BE AN EFFECTIVE CONCENTRATION FOR MANY PATIENTS. HOWEVER, SOME ARE BEST TREATED AT CONCENTRATIONS OUTSIDE THIS RANGE. ACETAMINOPHEN CONCENTRATIONS >150 ug/mL AT 4 HOURS AFTER INGESTION AND >50 ug/mL AT 12 HOURS AFTER INGESTION ARE OFTEN ASSOCIATED WITH TOXIC REACTIONS.   Salicylate level     Status: None   Collection Time: 07/05/14 11:10 PM  Result Value Ref Range   Salicylate Lvl <5.4 2.8 - 20.0 mg/dL  Urinalysis, Routine w reflex microscopic     Status: Abnormal   Collection Time: 07/05/14 11:42 PM  Result Value Ref Range   Color, Urine YELLOW YELLOW   APPearance CLOUDY (A) CLEAR   Specific Gravity, Urine 1.013 1.005 - 1.030   pH 7.0 5.0 - 8.0   Glucose, UA NEGATIVE NEGATIVE mg/dL   Hgb urine dipstick NEGATIVE NEGATIVE   Bilirubin Urine NEGATIVE NEGATIVE   Ketones, ur NEGATIVE NEGATIVE mg/dL   Protein, ur NEGATIVE NEGATIVE mg/dL   Urobilinogen, UA 1.0 0.0 - 1.0 mg/dL   Nitrite NEGATIVE NEGATIVE   Leukocytes, UA NEGATIVE NEGATIVE    Comment: MICROSCOPIC NOT DONE ON URINES WITH NEGATIVE PROTEIN, BLOOD, LEUKOCYTES, NITRITE, OR GLUCOSE <1000 mg/dL.  Urine rapid drug screen (hosp performed)     Status: None   Collection Time: 07/05/14 11:42 PM  Result Value Ref Range   Opiates NONE DETECTED NONE DETECTED   Cocaine NONE DETECTED NONE DETECTED   Benzodiazepines NONE DETECTED NONE DETECTED   Amphetamines NONE DETECTED NONE DETECTED   Tetrahydrocannabinol NONE DETECTED NONE DETECTED    Barbiturates NONE DETECTED NONE DETECTED    Comment:        DRUG SCREEN FOR MEDICAL PURPOSES ONLY.  IF CONFIRMATION IS NEEDED FOR ANY PURPOSE, NOTIFY LAB WITHIN 5 DAYS.        LOWEST DETECTABLE LIMITS FOR URINE DRUG SCREEN Drug Class       Cutoff (ng/mL) Amphetamine      1000 Barbiturate      200 Benzodiazepine   098 Tricyclics       119 Opiates          300 Cocaine          300 THC              50   POC Urine Pregnancy, ED  (If Pre-menopausal female) - do not order at [facility]     Status: None   Collection Time: 07/05/14 11:54 PM  Result Value Ref Range   Preg Test, Ur NEGATIVE NEGATIVE    Comment:        THE SENSITIVITY OF THIS METHODOLOGY IS >24 mIU/mL     Physical Findings: AIMS: Facial and Oral Movements Muscles of Facial Expression: None, normal Lips and Perioral Area: None, normal Jaw: None, normal Tongue: None, normal,Extremity Movements Upper (arms, wrists, hands, fingers): None, normal Lower (legs, knees, ankles, toes): None, normal, Trunk Movements Neck, shoulders, hips: None, normal, Overall Severity Severity of abnormal movements (highest score from questions above): None, normal Incapacitation due to abnormal movements: None, normal Patient's awareness of abnormal movements (rate only patient's report): No Awareness, Dental Status Current problems with teeth and/or dentures?: No Does patient usually wear dentures?: No  CIWA:    COWS:     Treatment Plan Summary: Daily contact with patient to assess and evaluate symptoms and progress in treatment and Medication  management Supportive approach/copign skills Major Depression: continue the Prozac 20 mg daily Insomnia: continue the Remeron and reassess effectiveness Pursue the Risperdal further to help with the ruminations  Reassess the use of alcohol in intervene accordingly  Medical Decision Making:  Review of Psycho-Social Stressors (1), Review or order clinical lab tests (1) and Review of Medication  Regimen & Side Effects (2)     Massai Hankerson A 07/07/2014, 8:42 PM

## 2014-07-08 MED ORDER — SENNOSIDES-DOCUSATE SODIUM 8.6-50 MG PO TABS
1.0000 | ORAL_TABLET | Freq: Every evening | ORAL | Status: DC | PRN
  Administered 2014-07-08: 1 via ORAL
  Filled 2014-07-08: qty 1

## 2014-07-08 MED ORDER — BISACODYL 10 MG RE SUPP
10.0000 mg | RECTAL | Status: AC
  Administered 2014-07-08: 10 mg via RECTAL
  Filled 2014-07-08: qty 1

## 2014-07-08 NOTE — Plan of Care (Signed)
Problem: Diagnosis: Increased Risk For Suicide Attempt Goal: STG-Patient Will Report Suicidal Feelings to Staff Outcome: Progressing Pt denied suicidal ideations or urge to harm self when assessed this shift.

## 2014-07-08 NOTE — Progress Notes (Signed)
D.  Pt pleasant on approach, interpreter present during interaction.  No complaints voiced at this time.  Denies SI/HI/hallucinations this evening.  Positive for evening wrap up group.  A.  Support and encouragement offered, medications given as ordered.  R.  Pt remains safe on unit, will continue to monitor.

## 2014-07-08 NOTE — Progress Notes (Signed)
Adult Psychoeducational Group Note  Date:  07/08/2014 Time:  3:26 PM  Group Topic/Focus:  Therapeutic Activity   Participation Level:  Did Not Attend   Elisha Headland 07/08/2014, 3:26 PM

## 2014-07-08 NOTE — BHH Group Notes (Signed)
[place] Group Notes:  (Clinical Social Work)  07/08/2014  10:00-11:00AM  Summary of Progress/Problems:   The main focus of today's process group was to   1)  discuss the importance of adding supports  2)  define health supports versus unhealthy supports  3)  identify the patient's current unhealthy supports and plan how to handle them  4)  Identify the patient's current healthy supports and plan what to add.  An emphasis was placed on using counselor, doctor, therapy groups, 12-step groups, and problem-specific support groups to expand supports.    The patient expressed full comprehension of the concepts presented, and agreed that there is a need to add more supports.  The patient stated through her interpreter that her children, mother, husband, and the hospital are her healthy supports.  She feels that the father of her children has been most unhealthy by preventing her from talking to her children.  She talked of the people in her life who are encouragers, such as her sister who has been through a lot of hard times herself.  Type of Therapy:  Process Group with Motivational Interviewing  Participation Level:  Active  Participation Quality:  Attentive and Sharing  Affect:  Appropriate  Cognitive:  Appropriate  Insight:  Engaged  Engagement in Therapy:  Engaged  Modes of Intervention:   Education, Support and Processing, Activity  [employer], LCSW 07/08/2014, 12:15pm

## 2014-07-08 NOTE — Progress Notes (Signed)
Pt was in her room reading through a magazine,. She does appear pleasant and cooperative. Pt does speak some english. She is waiting for her interpreter to return later. Pt has been attending groups and appears in good spirits smiling when the nurse talks to her. Pt receive MOM times 2 today and NP made aware pt has not had a BM yet. Will continue to encourage po fluid and monitor this. Pt denies SI and Hi and contracts for safety.

## 2014-07-08 NOTE — BHH Group Notes (Signed)
[place] Group Notes:  (Nursing/MHT/Case Management/Adjunct)  Date:  07/08/2014  Time: 0930 am  Type of Therapy:  Psychoeducational Skills  Participation Level:  Active  Participation Quality:  Appropriate and Attentive  Affect:  Appropriate  Cognitive:  Alert and Appropriate  Insight:  Good  Engagement in Group:  Developing/Improving  Modes of Intervention:  Support  Summary of Progress/Problems: Patient attended group with interpreter.  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  Patient was pleasant and attentive.  Zipporah Plants 07/08/2014, 10:13 AM

## 2014-07-08 NOTE — Progress Notes (Signed)
[facility] MD Progress Note  07/08/2014 11:15 AM Alexandra Norris  MRN:  [id number]   Subjective:   Assessment with interpretor  Patient's main complaint this morning is constipation, she has not had a BM in 4 days.  Has used suppository in the past/ will write for Dulcolax supp NOW  She rates depression 6/10 and anxiety 4/10  Her sleeps is  good   Appetite is poor.  She tells me she "feels better" and her dc plan includes returning home to her apartment, continue working (cleaning houses), and getting OP assistance at [employer].  Patient endorses visual hallucinations in the past "seeing shadows"  Discussed the importance of emotional awareness and proactive strategies for enhancing mental health well-being.    Principal Problem: <principal problem not specified> Diagnosis:   Patient Active Problem List   Diagnosis Date Noted  . Depressive disorder [F32.9] 07/06/2014  . Screening for malignant neoplasm of the cervix [Z12.4] 01/03/2013  . GASTROESOPHAGEAL REFLUX DISEASE [K21.9] 08/14/2009  . ETHMOIDAL SINUSITIS, ACUTE [J01.20] 05/14/2009  . DEPRESSION, SITUATIONAL, PROLONGED [F43.21] 02/05/2009  . UNSPECIFIED VISUAL LOSS [H54.7] 11/27/2008  . CHALAZION, LEFT [H00.19] 11/27/2008  . OBESITY [E66.9] 05/30/2007  . IRREGULAR MENSTRUAL CYCLE [N92.6] 05/30/2007  . ASTHMA, PERSISTENT [J45.909] 12/06/2006   Total Time spent with patient: 30 minutes   Past Medical History:  Past Medical History  Diagnosis Date  . History of bronchitis     "I've stayed in hospital 2 X w/bronchitis"  . Asthma   . History of blood transfusion 1999    w/vaginal delivery    Past Surgical History  Procedure Laterality Date  . Cholecystectomy  2011  . Cesarean section  1997; 2008  . Tubal ligation  04/2007   Family History:  Family History  Problem Relation Age of Onset  . Diabetes Mother   . Osteoarthritis Mother   . Hypertension Mother    Social History:  History  Alcohol Use No     History   Drug Use No    History   Social History  . Marital Status: Married    Spouse Name: N/A    Number of Children: N/A  . Years of Education: N/A   Social History Main Topics  . Smoking status: Never Smoker   . Smokeless tobacco: Never Used  . Alcohol Use: No  . Drug Use: No  . Sexual Activity: Yes    Birth Control/ Protection: None   Other Topics Concern  . None   Social History Narrative   Additional History:    Sleep: Poor  Appetite:  Fair   Assessment:   Musculoskeletal: Strength & Muscle Tone: within normal limits Gait & Station: normal Patient leans: N/A   Psychiatric Specialty Exam: Physical Exam  Constitutional: She is oriented to person, place, and time. She appears well-developed and well-nourished.  HENT:  Head: Normocephalic and atraumatic.  Neck: Normal range of motion. Neck supple.  Musculoskeletal: Normal range of motion.  Neurological: She is alert and oriented to person, place, and time.  Skin: Skin is warm and dry.    ROS  Blood pressure 101/56, pulse 82, temperature 97.7 F (36.5 C), temperature source Oral, resp. rate 20, height 4\' 11"  (1.499 m), weight 92.534 kg (204 lb), last menstrual period 06/14/2014.Body mass index is 41.18 kg/(m^2).  General Appearance: Fairly Groomed  Engineer, water::  Fair  Speech:  Clear and Coherent  Volume:  Decreased  Mood:  Depressed  Affect:  Depressed and Tearful  Thought Process:  Coherent and Goal  Directed  Orientation:  Full (Time, Place, and Person)  Thought Content:  events symptoms worries concerns  Suicidal Thoughts:  Yes.  without intent/plan  Homicidal Thoughts:  No  Memory:  Immediate;   Fair Recent;   Fair Remote;   Fair  Judgement:  Fair  Insight:  Present  Psychomotor Activity:  Decreased  Concentration:  Fair  Recall:  [employer] of Knowledge:Fair  Language: Fair  Akathisia:  No  Handed:  Right  AIMS (if indicated):     Assets:  Desire for  Improvement Housing Vocational/Educational  ADL's:  Intact  Cognition: WNL  Sleep:  Number of Hours: 6     Current Medications: Current Facility-Administered Medications  Medication Dose Route Frequency Provider Last Rate Last Dose  . acetaminophen (TYLENOL) tablet 650 mg  650 mg Oral Q6H PRN Malena Peer, NP   650 mg at 07/06/14 2026  . alum & mag hydroxide-simeth (MAALOX/MYLANTA) 200-200-20 MG/5ML suspension 30 mL  30 mL Oral Q4H PRN Evanna Glenda Chroman, NP      . bisacodyl (DULCOLAX) suppository 10 mg  10 mg Rectal NOW Knox Royalty, NP      . FLUoxetine (PROZAC) capsule 20 mg  20 mg Oral Daily Jenne Campus, MD   20 mg at 07/08/14 0834  . hydrOXYzine (ATARAX/VISTARIL) tablet 25 mg  25 mg Oral Q6H PRN Elmarie Shiley, NP   25 mg at 07/08/14 1003  . ibuprofen (ADVIL,MOTRIN) tablet 400 mg  400 mg Oral Q6H PRN Jenne Campus, MD   400 mg at 07/08/14 1003  . magnesium hydroxide (MILK OF MAGNESIA) suspension 30 mL  30 mL Oral Daily PRN Malena Peer, NP   30 mL at 07/07/14 1314  . mirtazapine (REMERON) tablet 15 mg  15 mg Oral QHS Jenne Campus, MD   15 mg at 07/07/14 2151  . risperiDONE (RISPERDAL) tablet 2 mg  2 mg Oral QHS Jenne Campus, MD   2 mg at 07/07/14 2151  . senna-docusate (Senokot-S) tablet 1 tablet  1 tablet Oral QHS PRN Knox Royalty, NP        Lab Results:  Results for orders placed or performed during the hospital encounter of 07/06/14 (from the past 48 hour(s))  TSH     Status: None   Collection Time: 07/07/14  7:20 PM  Result Value Ref Range   TSH 2.685 0.350 - 4.500 uIU/mL    Comment: Performed at [facility]    Physical Findings: AIMS: Facial and Oral Movements Muscles of Facial Expression: None, normal Lips and Perioral Area: None, normal Jaw: None, normal Tongue: None, normal,Extremity Movements Upper (arms, wrists, hands, fingers): None, normal Lower (legs, knees, ankles, toes): None, normal, Trunk Movements Neck, shoulders,  hips: None, normal, Overall Severity Severity of abnormal movements (highest score from questions above): None, normal Incapacitation due to abnormal movements: None, normal Patient's awareness of abnormal movements (rate only patient's report): No Awareness, Dental Status Current problems with teeth and/or dentures?: No Does patient usually wear dentures?: No  CIWA:    COWS:     Treatment Plan Summary: Daily contact with patient to assess and evaluate symptoms and progress in treatment and Medication management Supportive approach/copign skills Major Depression: continue the Prozac 20 mg daily Insomnia: continue the Remeron /reports improved sleep Pursue the Risperdal further to help with the ruminations  Medical Decision Making:  Review of Psycho-Social Stressors (1), Review or order clinical lab tests (1) and Review of Medication Regimen &  Side Effects (2)     LARACH, [place] 07/08/2014, 11:15 AM I personally evaluated the patient. She expressed that she is starting to feel better. She realizes she has to be well for his children. She is tolerating the medications well with no side effects Geralyn Flash A. Sabra Heck, M.D.

## 2014-07-09 DIAGNOSIS — F333 Major depressive disorder, recurrent, severe with psychotic symptoms: Secondary | ICD-10-CM | POA: Insufficient documentation

## 2014-07-09 LAB — HEMOGLOBIN A1C
HEMOGLOBIN A1C: 6.2 % — AB (ref 4.8–5.6)
Mean Plasma Glucose: 131 mg/dL

## 2014-07-09 MED ORDER — RISPERIDONE 2 MG PO TABS: 2.0000 mg | ORAL_TABLET | Freq: Every day | ORAL | Status: DC

## 2014-07-09 MED ORDER — MIRTAZAPINE 15 MG PO TABS: 15.0000 mg | ORAL_TABLET | Freq: Every day | ORAL | Status: DC

## 2014-07-09 MED ORDER — MAGNESIUM CITRATE PO SOLN
1.0000 | Freq: Once | ORAL | Status: AC
  Administered 2014-07-09: 1 via ORAL

## 2014-07-09 MED ORDER — FLUOXETINE HCL 20 MG PO CAPS
20.0000 mg | ORAL_CAPSULE | Freq: Every day | ORAL | Status: DC
Start: 2014-07-09 — End: 2015-01-11

## 2014-07-09 NOTE — Progress Notes (Addendum)
Pt is very approachable and pleasant. She has not had a BM times 4 days.NP made aware. Pt uses a translater and does speak some english. She does contract for safety and denies Si and HI. Pt spends a lot of time in her room looking through magazines. Pt will be discharged to home today. Pt states she wants to work on her person and follow up with her doctor. Pt rates her depression a 6/10 and her anxiety a 3/10. Pt through her interpreter stated her husband would pick her up at 3pm today.Pt stated she is not suicidal and appears very happy. 1:20pm -pt was given 400mg  of motrin for a headache a 8/10. Pt aso was given one bottle of mag citrate po for constipation. Interpreter remains with the pt who appears in good spirits. She stated her three sons live with their biological father and the pt lives with her husband. She stated everything is good at home/pt was given her RX and samples along with all her discharge instructions. She does contract for safety.

## 2014-07-09 NOTE — [facility] Group Notes (Signed)
[place] LCSW Group Therapy 07/09/2014  1:15 pm  Type of Therapy: Group Therapy Participation Level: Active  Participation Quality: Attentive, Sharing and Supportive  Affect: Appropriate  Cognitive: Alert and Oriented  Insight: Developing/Improving and Engaged  Engagement in Therapy: Developing/Improving and Engaged  Modes of Intervention: Clarification, Confrontation, Discussion, Education, Exploration,  Limit-setting, Orientation, Problem-solving, Rapport Building, Art therapist, Socialization and Support  Summary of Progress/Problems: Pt identified obstacles faced currently and processed barriers involved in overcoming these obstacles. Pt identified steps necessary for overcoming these obstacles and explored motivation (internal and external) for facing these difficulties head on. Pt further identified one area of concern in their lives and chose a goal to focus on for today. Patient identified feeling distant from her children as an obstacle. CSW provided emotional support and encouragement to patient.  Tilden Fossa, MSW, [place] Worker [facility] [phone]

## 2014-07-09 NOTE — [facility] Suicide Risk Assessment (Addendum)
[facility] Discharge Suicide Risk Assessment   Demographic Factors:  39 year old female, Spanish Speaking, Separated, Employed   Total Time spent with patient: 30 minutes  Musculoskeletal: Strength & Muscle Tone: within normal limits Gait & Station: normal Patient leans: N/A  Psychiatric Specialty Exam: Physical Exam  Review of Systems  Constitutional: Negative for fever and chills.  Respiratory: Negative for cough.   Cardiovascular: Negative for chest pain.  Gastrointestinal: Negative for vomiting and diarrhea.  Genitourinary: Negative for dysuria, urgency and frequency.  Skin: Negative for rash.  Neurological: Negative for headaches.    Blood pressure 94/44, pulse 104, temperature 97.8 F (36.6 C), temperature source Oral, resp. rate 16, height 4\' 11"  (1.499 m), weight 204 lb (92.534 kg), last menstrual period 06/14/2014.Body mass index is 41.18 kg/(m^2).  General Appearance: improved grooming   Eye Contact::  Good  Speech:  Normal Rate409  Volume:  Normal  Mood:  Euthymic- no current depression  Affect:  Appropriate and Full Range  Thought Process:  Goal Directed and Linear  Orientation:  Full (Time, Place, and Person)  Thought Content:  denies hallucinations, no delusions  Suicidal Thoughts:  No- denies any thoughts of hurting self or anyone else   Homicidal Thoughts:  No  Memory:  recent and remote grossly intact   Judgement:  Other:  improved   Insight:  improved   Psychomotor Activity:  Normal  Concentration:  Good  Recall:  Good  Fund of Knowledge:Good  Language: Good  Akathisia:  No  Handed:  Right  AIMS (if indicated):     Assets:  Desire for Improvement Resilience Vocational/Educational  Sleep:  Number of Hours: 6.5  Cognition: WNL  ADL's:  Intact   Have you used any form of tobacco in the last 30 days? (Cigarettes, Smokeless Tobacco, Cigars, and/or Pipes): No  Has this patient used any form of tobacco in the last 30 days? (Cigarettes, Smokeless Tobacco,  Cigars, and/or Pipes) No  Mental Status Per Nursing Assessment::   On Admission:     Current Mental Status by Physician: As noted , at this time patient is improved compared to admission, with full range of affect, no thought disorder, no SI or HI, no psychotic symptoms, future oriented .  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 Is alert , attentive, and oriented x 3   Suicide Risk:  Mild:  Suicidal ideation of limited frequency, intensity, duration, and specificity.  There are no identifiable plans, no associated intent, mild dysphoria and related symptoms, good self-control (both objective and subjective assessment), few other risk factors, and identifiable protective factors, including available and accessible social support.  Principal Problem: Depression Discharge Diagnoses:  Patient Active Problem List   Diagnosis Date Noted  . Depressive disorder [F32.9] 07/06/2014  . Screening for malignant neoplasm of the cervix [Z12.4] 01/03/2013  . GASTROESOPHAGEAL REFLUX DISEASE [K21.9] 08/14/2009  . ETHMOIDAL SINUSITIS, ACUTE [J01.20] 05/14/2009  . DEPRESSION, SITUATIONAL, PROLONGED [F43.21] 02/05/2009  . UNSPECIFIED VISUAL LOSS [H54.7] 11/27/2008  .  CHALAZION, LEFT [H00.19] 11/27/2008  . OBESITY [E66.9] 05/30/2007  . IRREGULAR MENSTRUAL CYCLE [N92.6] 05/30/2007  . ASTHMA, PERSISTENT [J45.909] 12/06/2006     Follow-up Information    Follow up with [facility].   Specialty:  Professional Counselor   Why:  Walk-in clinic Monday-Friday between 8 am to 12 pm for assessment for therapy and medication management services.   Contact information:   [facility] [place] [address] [phone]       Plan Of Care/Follow-up recommendations:  Activity:  As tolerated Diet:  Regular Tests:  NA Other:  See below  Is patient on multiple antipsychotic therapies at discharge:  No   Has Patient had three or more failed trials of antipsychotic monotherapy by history:  No  Recommended Plan for Multiple Antipsychotic Therapies: NA   Patient is leaving in good spirits. She is discharged home. Follow up as above. Patient encouraged to follow up with PCP for medical  Monitoring , management, monitoring of HgBA1C.    Rockelle Heuerman 07/09/2014, 12:47 PM

## 2014-07-09 NOTE — [facility] Suicide Risk Assessment (Signed)
[facility] INPATIENT:  Family/Significant Other Suicide Prevention Education  Suicide Prevention Education:  Education Completed; brother Rhae Lerner [phone],  (name of family member/significant other) has been identified by the patient as the family member/significant other with whom the patient will be residing, and identified as the person(s) who will aid the patient in the event of a mental health crisis (suicidal ideations/suicide attempt).  With written consent from the patient, the family member/significant other has been provided the following suicide prevention education, prior to the and/or following the discharge of the patient.  The suicide prevention education provided includes the following:  Suicide risk factors  Suicide prevention and interventions  National Suicide Hotline telephone number  [facility] assessment telephone number  [facility] Emergency Assistance [place] and/or Residential Mobile Crisis Unit telephone number  Request made of family/significant other to:  Remove weapons (e.g., guns, rifles, knives), all items previously/currently identified as safety concern.    Remove drugs/medications (over-the-counter, prescriptions, illicit drugs), all items previously/currently identified as a safety concern.  The family member/significant other verbalizes understanding of the suicide prevention education information provided.  The family member/significant other agrees to remove the items of safety concern listed above.  Kawena Lyday, Casimiro Needle 07/09/2014, 12:46 PM

## 2014-07-09 NOTE — Progress Notes (Signed)
  [facility] Adult Case Management Discharge Plan :  Will you be returning to the same living situation after discharge:  Yes,  patient plans to return to her apartment At discharge, do you have transportation home?: Yes,  patient reports access to transportation Do you have the ability to pay for your medications: Yes,  patient will be provided with medication samples and prescriptions at discharge  Release of information consent forms completed and in the chart;  Patient's signature needed at discharge.  Patient to Follow up at: Follow-up Information    Follow up with [facility].   Specialty:  Professional Counselor   Why:  Walk-in clinic Monday-Friday between 8 am to 12 pm for assessment for therapy and medication management services.   Contact information:   [facility] [place] [address] [phone]       Patient denies SI/HI: Yes,  denies    Safety Planning and Suicide Prevention discussed: Yes,  with patient and brother  Has patient been referred to the Quitline?: N/A patient is not a smoker  Lorrayne Ismael, Casimiro Needle 07/09/2014, 12:46 PM
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
## 2014-07-09 NOTE — [facility] Group Notes (Addendum)
   [facility] LCSW Aftercare Discharge Planning Group Note  07/09/2014  8:45 AM   Participation Quality: Alert, Appropriate and Oriented  Mood/Affect: Appropriate  Depression Rating: 6  Anxiety Rating: 4  Thoughts of Suicide: Pt denies SI/HI  Will you contract for safety? Yes  Current AVH: Pt denies  Plan for Discharge/Comments: Pt attended discharge planning group and actively participated in group. CSW provided pt with today's workbook. Patient reports feeling "good" today. She reports feeling ready to discharge. She plans to return home to follow up with [facility] for outpatient services.   Transportation Means: Pt reports access to transportation via family  Supports: Patient has identified several family members as supportive.  Tilden Fossa, MSW, [place] Worker [facility] [phone]

## 2014-07-13 NOTE — Progress Notes (Signed)
Patient Discharge Instructions:  After Visit Summary (AVS):   Faxed to:  07/13/14 Discharge Summary Note:   Faxed to:  07/13/14 Psychiatric Admission Assessment Note:   Faxed to:  07/13/14 Suicide Risk Assessment - Discharge Assessment:   Faxed to:  07/13/14 Faxed/Sent to the Next Level Care provider:  07/13/14 Faxed to [facility] @ [facility], 07/13/2014, 1:49 PM

## 2014-09-20 ENCOUNTER — Emergency Department (INDEPENDENT_AMBULATORY_CARE_PROVIDER_SITE_OTHER): Payer: Self-pay

## 2014-09-20 ENCOUNTER — Emergency Department (INDEPENDENT_AMBULATORY_CARE_PROVIDER_SITE_OTHER)
Admission: EM | Admit: 2014-09-20 | Discharge: 2014-09-20 | Disposition: A | Discharge status: Home / Self Care | Payer: Self-pay | Source: Home / Self Care | Attending: Family Medicine | Admitting: Family Medicine

## 2014-09-20 ENCOUNTER — Encounter (HOSPITAL_COMMUNITY): Payer: Self-pay | Admitting: Emergency Medicine

## 2014-09-20 DIAGNOSIS — J45901 Unspecified asthma with (acute) exacerbation: Secondary | ICD-10-CM

## 2014-09-20 DIAGNOSIS — M6530 Trigger finger, unspecified finger: Secondary | ICD-10-CM

## 2014-09-20 IMAGING — DX DG HAND COMPLETE 3+V*R*
3 series · 3 of 3 positions shown · non-contrast
Comparison: [DATE]

CLINICAL DATA: Pain following fall 5 days prior

EXAM:
RIGHT HAND - COMPLETE 3+ VIEW

[hand pa]
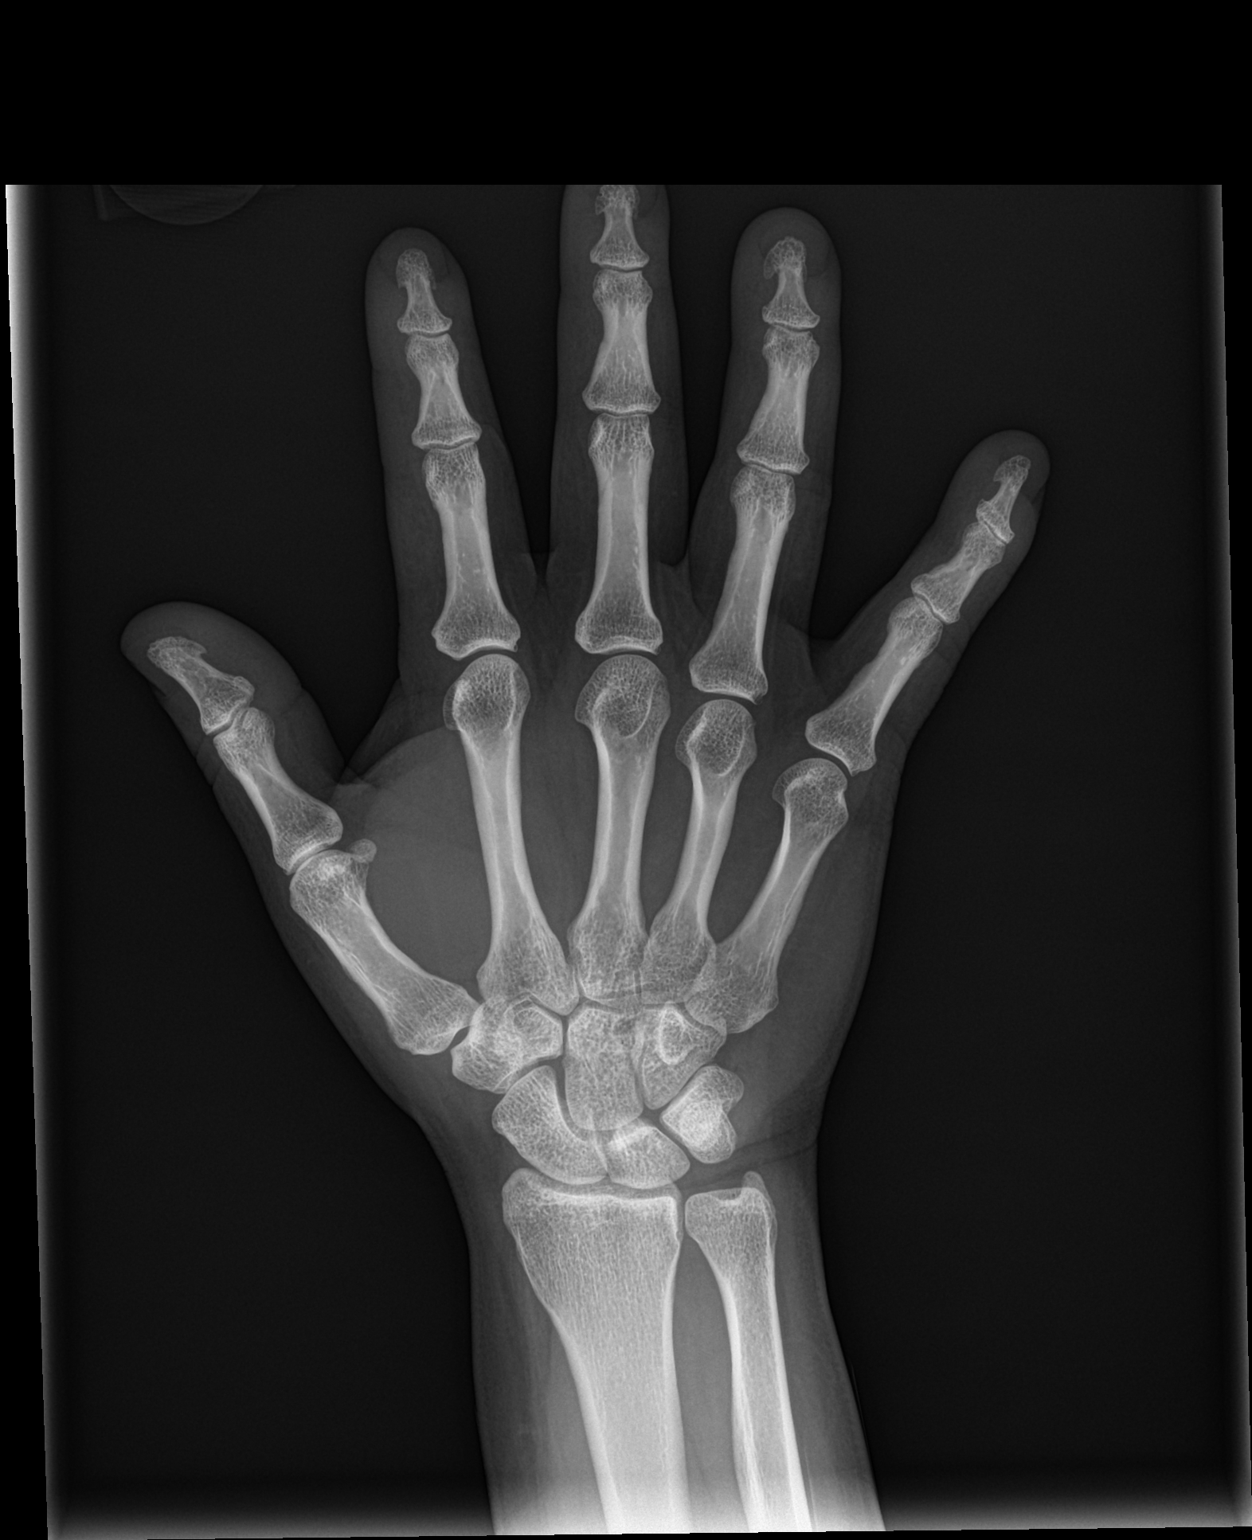

[hand obl]
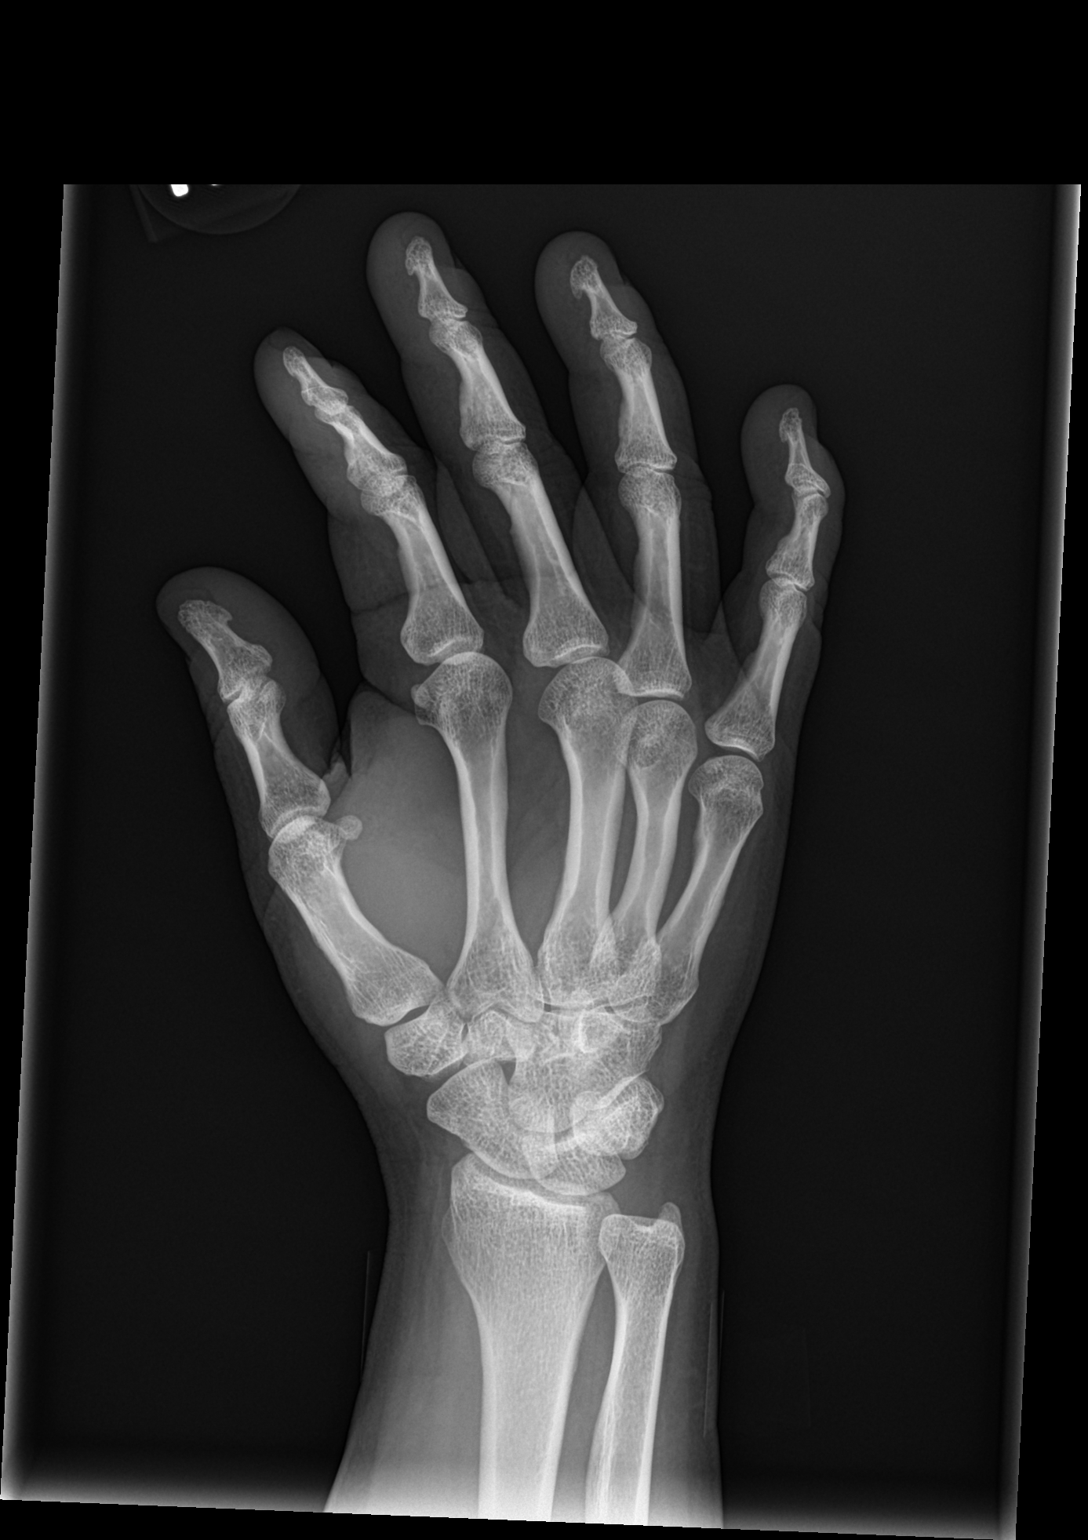

[hand lat]
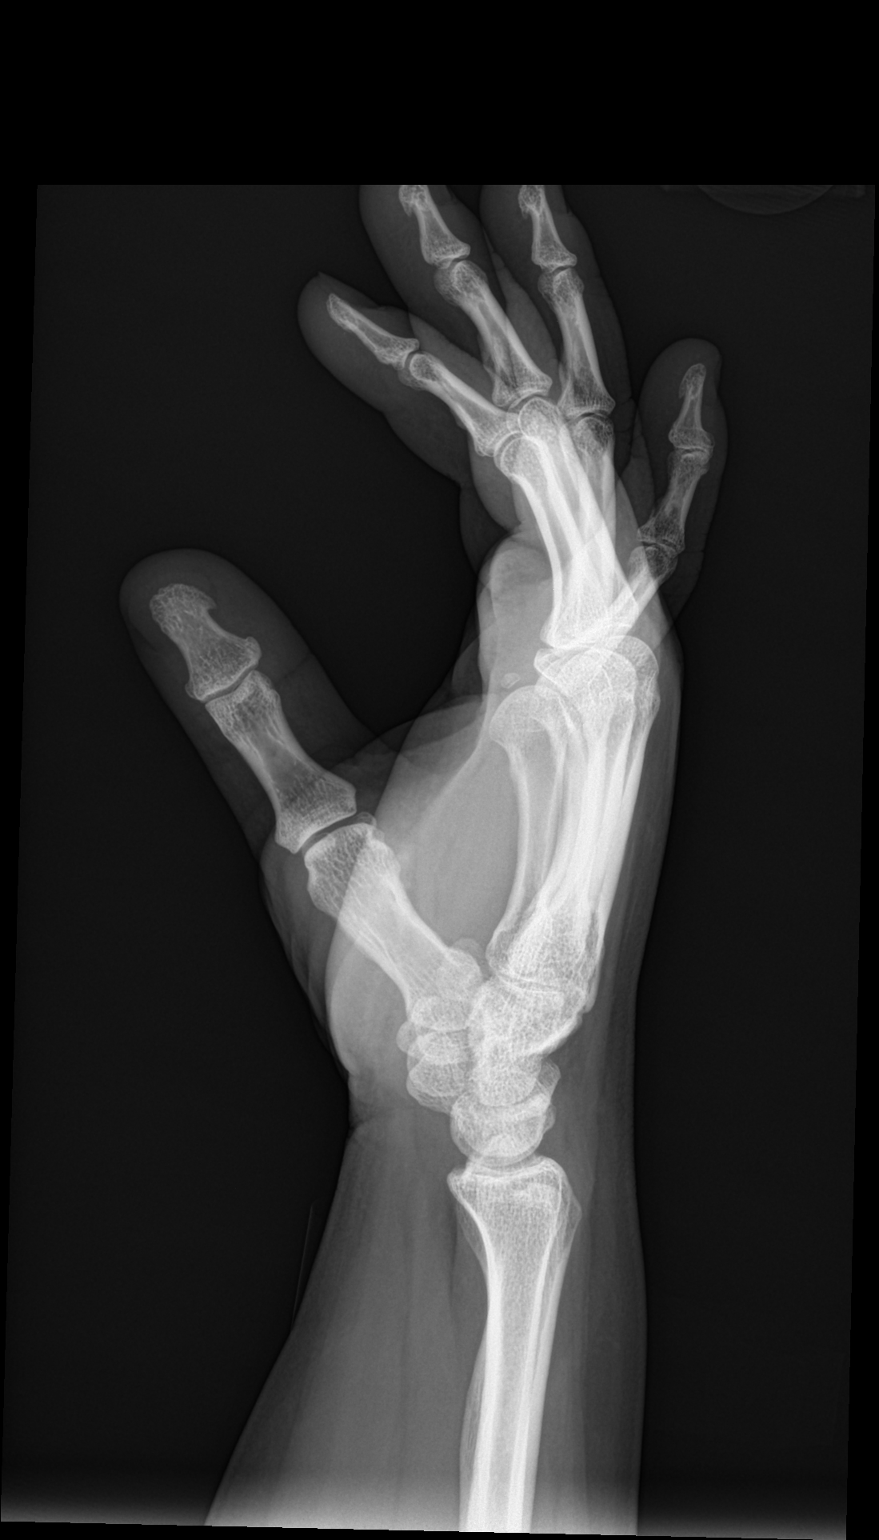

[3 of 3 positions shown; findings below may reference images not displayed]

FINDINGS: Frontal, oblique, and lateral views were obtained. There is no
fracture or dislocation. Joint spaces appear intact. No erosive
change.
IMPRESSION: No fracture or dislocation.  No appreciable arthropathic change.

## 2014-09-20 MED ORDER — PREDNISONE 50 MG PO TABS: 50.0000 mg | ORAL_TABLET | Freq: Every day | ORAL | Status: DC

## 2014-09-20 MED ORDER — IPRATROPIUM-ALBUTEROL 0.5-2.5 (3) MG/3ML IN SOLN
3.0000 mL | Freq: Once | RESPIRATORY_TRACT | Status: AC
  Administered 2014-09-20: 3 mL via RESPIRATORY_TRACT

## 2014-09-20 MED ORDER — IPRATROPIUM-ALBUTEROL 0.5-2.5 (3) MG/3ML IN SOLN
RESPIRATORY_TRACT | Status: AC
  Filled 2014-09-20: qty 3

## 2014-09-20 NOTE — ED Provider Notes (Signed)
Alexandra Norris is a 39 y.o. female who presents to Urgent Care today for shortness of breath and hand pain.  1) wheezing and shortness of breath started about a week ago. Symptoms are consistent with prior episodes of asthma. No fevers or chills vomiting or diarrhea. Patient has chest burning worse with deep inspiration and cough. She denies any exertional chest pain or squeezing. She has used albuterol which helps temporarily 2) right fourth digit pain. Patient notes pain at the right fourth MCP and PIP with flexion of her finger. She feels as if the finger gets stuck in a flexed position and she has to force it open. Symptoms present for about a week. She did note a recent fall. No radiating pain weakness or numbness.   Past Medical History  Diagnosis Date  . History of bronchitis     "I've stayed in hospital 2 X w/bronchitis"  . Asthma   . History of blood transfusion 1999    w/vaginal delivery   Past Surgical History  Procedure Laterality Date  . Cholecystectomy  2011  . Cesarean section  1997; 2008  . Tubal ligation  04/2007   History  Substance Use Topics  . Smoking status: Never Smoker   . Smokeless tobacco: Never Used  . Alcohol Use: No   ROS as above Medications: No current facility-administered medications for this encounter.   Current Outpatient Prescriptions  Medication Sig Dispense Refill  . albuterol (PROVENTIL HFA;VENTOLIN HFA) 108 (90 BASE) MCG/ACT inhaler Inhale 2 puffs into the lungs every 6 (six) hours as needed for wheezing. 1 Inhaler 0  . FLUoxetine (PROZAC) 20 MG capsule Take 1 capsule (20 mg total) by mouth daily. 30 capsule 0  . ibuprofen (ADVIL,MOTRIN) 200 MG tablet Take 800 mg by mouth every 6 (six) hours as needed for headache.    . mirtazapine (REMERON) 15 MG tablet Take 1 tablet (15 mg total) by mouth at bedtime. 30 tablet 0  . predniSONE (DELTASONE) 50 MG tablet Take 1 tablet (50 mg total) by mouth daily. 5 tablet 0  . risperiDONE (RISPERDAL) 2  MG tablet Take 1 tablet (2 mg total) by mouth at bedtime. 30 tablet 0   No Known Allergies   Exam:  BP 140/81 mmHg  Pulse 74  Temp(Src) 98.5 F (36.9 C) (Oral)  Resp 20  SpO2 96%  LMP 09/07/2014 Gen: Well NAD HEENT: EOMI,  MMM Lungs: Normal work of breathing. Wheezing present bilaterally Heart: RRR no MRG Abd: NABS, Soft. Nondistended, Nontender Exts: Brisk capillary refill, warm and well perfused.  Right hand normal-appearing no swelling or ecchymosis. Fourth digit nontender. Palpable nodule felt with flexion at the PIP of the fourth digit. Pulses Refill sensation intact  Patient was given a 2.5/0.5 mg DuoNeb nebulizer treatment and felt better  No results found for this or any previous visit (from the past 24 hour(s)). Dg Hand Complete Right  09/20/2014   CLINICAL DATA:  Pain following fall 5 days prior  EXAM: RIGHT HAND - COMPLETE 3+ VIEW  COMPARISON:  July 16, 2005  FINDINGS: Frontal, oblique, and lateral views were obtained. There is no fracture or dislocation. Joint spaces appear intact. No erosive change.  IMPRESSION: No fracture or dislocation.  No appreciable arthropathic change.   Electronically Signed   By: Lowella Grip III M.D.   On: 09/20/2014 15:21    Assessment and Plan: 39 y.o. female with  1) asthma exacerbation: Treat with prednisone and albuterol 2) hand pain: Trigger finger. Double banded splint and  a prednisone  Discussed warning signs or symptoms. Please see discharge instructions. Patient expresses understanding.     Gregor Hams, MD 09/20/14 1556

## 2014-09-20 NOTE — Discharge Instructions (Signed)
Asma, broncoespasmo agudo °(Asthma, Acute Bronchospasm) °El broncoespasmo agudo causado por el asma también se conoce como crisis de asma. Broncoespasmo significa que las vías respiratorias se han estrechado. La causa del estrechamiento es la inflamación y la constricción de los músculos de las vías respiratorias (bronquios) que se encuentran en los pulmones. Esto puede dificultar la respiración o provocarle sibilancias y tos. °CAUSAS °Los desencadenantes posibles son: °· La caspa que eliminan los animales de la piel, el pelo o las plumas de los animales. °· Los ácaros que se encuentran en el polvo de la casa. °· Cuc[place]as. °· El polen de los árboles o el césped. °· Moho. °· El humo del cigarrillo o del tabaco °· Sustancias contaminantes como el polvo, limpiadores hogareños, aerosoles (como los aerosoles para el cabello), vapores de pintura, sustancias químicas fuertes u olores intensos. °· El aire frío o cambios climáticos. El aire frío puede causar inflamación. El viento aumenta la cantidad de moho y polen del aire. °· Emociones fuertes, como llorar o reír intensamente. °· Estrés. °· Ciertos medicamentos como la aspirina o betabloqueantes. °· Los sulfitos que se encuentran en las comidas y bebidas como frutas secas y el vino. °· Enfermedades infecciosas o inflamatorias, como la gripe, el resfrío o la inflamación de las membranas nasales (rinitis). °· El reflujo gastroesofágico (ERGE). El reflujo gastroesofágico es una afección en la que los ácidos estomacales vuelven al esófago. °· Los ejercicios o actividades extenuantes. °SIGNOS Y SÍNTOMAS  °· Sibilancias. °· Tos intensa, especialmente por la noche. °· Opresión en el pecho. °· Falta de aire. °DIAGNÓSTICO  °El médico le hará una historia clínica y le hará un examen físico. Le indicarán radiografías o análisis de sangre para buscar otras causas de los síntomas u otras enfermedades que puedan desencadenar una crisis de asma.  °TRATAMIENTO  °El tratamiento está  dirigido a reducir la inflamación y abrir las vías respiratorias en los pulmones. La mayor parte de las crisis asmáticas se tratan con medicamentos por vía inhalatoria. Entre ellos se incluyen los medicamentos de alivio rápido o medicamentos de rescate (como los broncodilatadores) y los medicamentos de control (como los corticoides inhalados). Estos medicamentos se administran a través de un inhalador o de un nebulizador. Los corticoides sistémicos por vía oral o por vía intravenosa también se administran para reducir la inflamación cuando un ataque es moderado o grave. Los antibióticos se indican solo si hay infección bacteriana.  °INSTRUCCIONES PARA EL CUIDADO EN EL HOGAR  °· Reposo. °· Beba líquido en abundancia. Esto ayuda a diluir la mucosidad y a eliminarla fácilmente. Solo consuma productos con cafeína moderadamente y no consuma alcohol hasta que se haya recuperado de la enfermedad. °· No fume. Evite la exposición al humo de otros fumadores. °· Usted tiene un rol fundamental en mantener su buena salud. Evite la exposición a lo que le ocasiona los problemas respiratorios. °· Mantenga los medicamentos actualizados y al alcance. Siga cuidadosamente el plan de tratamiento del médico. °· Utilice los medicamentos tal como se le indicó. °· Cuando haya mucho polen o polución, mantenga las ventanas cerradas y use el aire acondicionado o vaya a lugares con aire acondicionado. °· El asma requiere atención médica exhaustiva. Concurra a los controles según las indicaciones. Si tiene un embarazo de más de 24 semanas y le han recetado medicamentos nuevos, coméntelo con su obstetra y cuál es su evolución. Concurra a las consultas de control con su médico según las indicaciones. °· Después de recuperarse de la crisis de asma, haga una cita con   el mdico para conocer cmo puede reducir la probabilidad de futuros ataques. Si no cuenta con un mdico para que controle su asma, haga una cita con un mdico de atencin primaria para  hablar de esta enfermedad. SOLICITE ATENCIN MDICA DE INMEDIATO SI:   Empeora.  Tiene dificultad para respirar. Si la dificultad es intensa comunquese con el servicio de Sports administrator de su localidad (911 en los [place]).  Siente dolor o International aid/development worker.  Tiene vmitos.  No puede retener los lquidos.  Elimina una expectoracin verde, amarilla, amarronada o sanguinolenta.  Tiene fiebre y los sntomas empeoran repentinamente.  Presenta dificultad para tragar. ASEGRESE DE QUE:   Comprende estas instrucciones.  Controlar su afeccin.  Recibir ayuda de inmediato si no mejora o si empeora. Document Released: 09/10/2008 Document Revised: 05/30/2013 [facility] Patient Information 2015 [place], [place]. This information is not intended to replace advice given to you by your health care provider. Make sure you discuss any questions you have with your health care provider.   Dedo en gatillo Museum/gallery exhibitions officer) El dedo en gatillo (tendinitis digital y tenosinovitis estenosante) es un trastorno frecuente que causa la contraccin dolorosa de los dedos o Multimedia programmer. Ocurre como un chasquido, crujido o sensacin de un dedo atascado en la palma de la [place]. La causa es un trastorno de los tendones que flexionan o [employer] dedos deslizndose suavemente a travs de sus vainas. La afeccin puede ocurrir en cualquier dedo o en un par de dedos al [employer].  El dedo puede trabarse con el dedo curvado o enderezarse repentinamente con un chasquido. Es ms frecuente en pacientes con artritis reumatoidea y diabetes. Si no se trata, esta afeccin puede empeorar hasta el punto en el que los dedos se bloquean en su flexin, como al formar un puo, o con Adult nurse frecuencia se bloquean con el dedo extendido CAUSAS   La inflamacin y cicatrizacin que producen hinchazn alrededor de la vaina del tendn.  Movimientos repetidos o forzosos.  Artritis reumatoidea, una enfermedad autoinmune que afecta  las articulaciones.  Gota.  Diabetes mellitus. SIGNOS Y SNTOMAS  Dolor e hinchazn en el dedo.  click o crujido doloroso al doblar y Metallurgist dedo. DIAGNSTICO  El mdico har un examen fsico del dedo para diagnosticar el dedo en gatillo. TRATAMIENTO   La colocacin de un cabestrillo durante 6-8 semanas puede ser de [place].  Los antiinflamatorios no esteroides (AINEs) pueden ayudar a Engineer, materials de la inflamacin.  Las inyecciones de corticoides, junto con el cabestrillo, pueden acelerar la recuperacin. Ser necesario aplicar varias inyecciones. La cortisona puede brindar alivio luego de una inyeccin.  La ciruga es otro tratamiento que puede utilizarse si los tratamientos conservadores no funcionan. La ciruga puede ser [employer], sin incisiones (no se realiza ningn corte), y puede llevarse a cabo con una aguja a travs de la piel.  Otras opciones quirrgicas consisten en un procedimiento abierto en el cual el cirujano hace una pequea incisin y corta la parte tensionada de modo que el tendn pueda deslizarse suavemente otra vez. La mano seguir funcionando bien. INSTRUCCIONES PARA EL CUIDADO EN EL HOGAR  Aplique hielo sobre la zona [employer] por da.  Ponga el hielo en una bolsa plstica.  Colquese una toalla entre la piel y la bolsa de hielo.  Deje el hielo durante 20 minutos, y aplquelo 3-4 veces por Futures trader.  Haga descansar la mano con frecuencia. ASEGRESE DE QUE:   Comprende estas instrucciones.  Controlar su afeccin.  Recibir ayuda de inmediato si no mejora o si empeora. Document Released: 01/25/2013 [facility] Patient Information 2015 [place]. This information is not intended to replace advice given to you by your health care provider. Make sure you discuss any questions you have with your health care provider.

## 2014-09-20 NOTE — ED Notes (Signed)
Multiple complaints right hand pain secondary to fall.   Cough, congestion , stuffiness

## 2014-09-27 ENCOUNTER — Encounter: Payer: Self-pay | Admitting: Internal Medicine

## 2014-09-27 ENCOUNTER — Ambulatory Visit: Payer: Self-pay | Attending: Internal Medicine | Admitting: Internal Medicine

## 2014-09-27 VITALS — BP 127/84 | HR 75 | Temp 97.8°F | Ht 61.0 in | Wt 211.0 lb

## 2014-09-27 DIAGNOSIS — J309 Allergic rhinitis, unspecified: Secondary | ICD-10-CM

## 2014-09-27 DIAGNOSIS — R7303 Prediabetes: Secondary | ICD-10-CM

## 2014-09-27 DIAGNOSIS — R7309 Other abnormal glucose: Secondary | ICD-10-CM

## 2014-09-27 DIAGNOSIS — R062 Wheezing: Secondary | ICD-10-CM

## 2014-09-27 LAB — HEMOGLOBIN A1C
Hgb A1c MFr Bld: 5.8 % — ABNORMAL HIGH (ref <5.7)
MEAN PLASMA GLUCOSE: 120 mg/dL — AB (ref <117)

## 2014-09-27 MED ORDER — CETIRIZINE HCL 10 MG PO TABS: 10.0000 mg | ORAL_TABLET | Freq: Every day | ORAL | Status: DC

## 2014-09-27 MED ORDER — FLUTICASONE PROPIONATE 50 MCG/ACT NA SUSP: 2.0000 | Freq: Every day | NASAL | Status: DC

## 2014-09-27 MED ORDER — ALBUTEROL SULFATE HFA 108 (90 BASE) MCG/ACT IN AERS: 2.0000 | INHALATION_SPRAY | Freq: Four times a day (QID) | RESPIRATORY_TRACT | Status: DC | PRN

## 2014-09-27 NOTE — Progress Notes (Signed)
Patient ID: Alexandra Norris, female   DOB: 31-Aug-1975, 39 y.o.   MRN: [id number]  CC: f/u  HPI: Alexandra Norris is a 39 y.o. female here today for a follow up visit.  Patient has past medical history of asthma.  Patient was seen on 4/14 at the urgent care for asthma symptoms and a pain in her right hand. She reports that she was given an breathing treatment and prednisone. She reports that she feels better since leaving urgent care but still has wheezing randomly at night. She has had a flare over the past two weeks. She believes it is related to seasonal allergies. Eye itching, sneezing, cough, eye watering, nasal congestion. She reports that claritin does not help her.  Trigger finger of right ring finger and a family history of diabetes.   No Known Allergies Past Medical History  Diagnosis Date  . History of bronchitis     "I've stayed in hospital 2 X w/bronchitis"  . Asthma   . History of blood transfusion 1999    w/vaginal delivery   Current Outpatient Prescriptions on File Prior to Visit  Medication Sig Dispense Refill  . albuterol (PROVENTIL HFA;VENTOLIN HFA) 108 (90 BASE) MCG/ACT inhaler Inhale 2 puffs into the lungs every 6 (six) hours as needed for wheezing. 1 Inhaler 0  . FLUoxetine (PROZAC) 20 MG capsule Take 1 capsule (20 mg total) by mouth daily. 30 capsule 0  . risperiDONE (RISPERDAL) 2 MG tablet Take 1 tablet (2 mg total) by mouth at bedtime. 30 tablet 0  . ibuprofen (ADVIL,MOTRIN) 200 MG tablet Take 800 mg by mouth every 6 (six) hours as needed for headache.    . mirtazapine (REMERON) 15 MG tablet Take 1 tablet (15 mg total) by mouth at bedtime. (Patient not taking: Reported on 09/27/2014) 30 tablet 0   No current facility-administered medications on file prior to visit.   Family History  Problem Relation Age of Onset  . Diabetes Mother   . Osteoarthritis Mother   . Hypertension Mother    History   Social History  . Marital Status: Married    Spouse Name:  N/A  . Number of Children: N/A  . Years of Education: N/A   Occupational History  . Not on file.   Social History Main Topics  . Smoking status: Never Smoker   . Smokeless tobacco: Never Used  . Alcohol Use: No  . Drug Use: No  . Sexual Activity: Yes    Birth Control/ Protection: None   Other Topics Concern  . Not on file   Social History Narrative    Review of Systems: See HPI   Objective:   Filed Vitals:   09/27/14 1635  BP: 127/84  Pulse: 75  Temp: 97.8 F (36.6 C)    Physical Exam  Constitutional: She is oriented to person, place, and time.  Cardiovascular: Normal rate, regular rhythm and normal heart sounds.   Pulmonary/Chest: Effort normal and breath sounds normal. She has no wheezes.  Neurological: She is alert and oriented to person, place, and time.  Skin: Skin is warm and dry.     Lab Results  Component Value Date   WBC 10.7* 07/05/2014   HGB 12.5 07/05/2014   HCT 37.9 07/05/2014   MCV 80.3 07/05/2014   PLT 258 07/05/2014   Lab Results  Component Value Date   CREATININE 0.41* 07/05/2014   BUN 8 07/05/2014   NA 142 07/05/2014   K 3.9 07/05/2014   CL 115* 07/05/2014  CO2 21 07/05/2014    Lab Results  Component Value Date   HGBA1C 6.2* 07/07/2014   Lipid Panel     Component Value Date/Time   CHOL 143 01/03/2013 1603   TRIG 107 01/03/2013 1603   HDL 48 01/03/2013 1603   CHOLHDL 3.0 01/03/2013 1603   VLDL 21 01/03/2013 1603   LDLCALC 74 01/03/2013 1603       Assessment and plan:   Briannia was seen today for asthma, hand pain, headache and depression.  Diagnoses and all orders for this visit:  Wheezing Orders: -    refill albuterol (PROVENTIL HFA;VENTOLIN HFA) 108 (90 BASE) MCG/ACT inhaler; Inhale 2 puffs into the lungs every 6 (six) hours as needed for wheezing.  Allergic rhinitis, unspecified allergic rhinitis type Orders: -     Begin cetirizine (ZYRTEC) 10 MG tablet; Take 1 tablet (10 mg total) by mouth daily. For  allergies -    Begin fluticasone (FLONASE) 50 MCG/ACT nasal spray; Place 2 sprays into both nostrils daily. Allergies  Prediabetes Orders: -     Hemoglobin A1c  Return if symptoms worsen or fail to improve.       Chari Manning, NP-C [facility] [phone] 09/27/2014, 5:07 PM

## 2014-09-27 NOTE — Progress Notes (Signed)
Patient went to hospital for her asthma.  She is feeling better now.  She also fell and hurt her right hand.

## 2014-09-27 NOTE — Patient Instructions (Signed)
Dedo en gatillo Animal nutritionist) El dedo en gatillo (tendinitis digital y tenosinovitis estenosante) es un trastorno frecuente que causa la contraccin dolorosa de los dedos o Counselling psychologist. Ocurre como un chasquido, crujido o sensacin de un dedo atascado en la palma de la [place]. La causa es un trastorno de los tendones que flexionan o [employer] dedos deslizndose suavemente a travs de sus vainas. La afeccin puede ocurrir en cualquier dedo o en un par de dedos al [employer].  El dedo puede trabarse con el dedo curvado o enderezarse repentinamente con un chasquido. Es ms frecuente en pacientes con artritis reumatoidea y diabetes. Si no se trata, esta afeccin puede empeorar hasta el punto en el que los dedos se bloquean en su flexin, como al formar un puo, o con Garment/textile technologist frecuencia se bloquean con el dedo extendido CAUSAS   La inflamacin y cicatrizacin que producen hinchazn alrededor de la vaina del tendn.  Movimientos repetidos o forzosos.  Artritis reumatoidea, una enfermedad autoinmune que afecta las articulaciones.  Gota.  Diabetes mellitus. SIGNOS Y SNTOMAS  Dolor e hinchazn en el dedo.  click o crujido doloroso al doblar y Location manager dedo. DIAGNSTICO  El mdico har un examen fsico del dedo para diagnosticar el dedo en gatillo. TRATAMIENTO   La colocacin de un cabestrillo durante 6-8 semanas puede ser de [place].  Los antiinflamatorios no esteroides (AINEs) pueden ayudar a Best boy de la inflamacin.  Las inyecciones de corticoides, junto con el cabestrillo, pueden acelerar la recuperacin. Ser necesario aplicar varias inyecciones. La cortisona puede brindar alivio luego de una inyeccin.  La ciruga es otro tratamiento que puede utilizarse si los tratamientos conservadores no funcionan. La ciruga puede ser [employer], sin incisiones (no se realiza ningn corte), y puede llevarse a cabo con una aguja a travs de la piel.  Otras opciones quirrgicas consisten en un  procedimiento abierto en el cual el cirujano hace una pequea incisin y corta la parte tensionada de modo que el tendn pueda deslizarse suavemente otra vez. La mano seguir funcionando bien. INSTRUCCIONES PARA EL CUIDADO EN EL HOGAR  Aplique hielo sobre la zona [employer] por da.  Ponga el hielo en una bolsa plstica.  Colquese una toalla entre la piel y la bolsa de hielo.  Deje el hielo durante 20 minutos, y aplquelo 3-4 veces por Training and development officer.  Haga descansar la mano con frecuencia. ASEGRESE DE QUE:   Comprende estas instrucciones.  Controlar su afeccin.  Recibir ayuda de inmediato si no mejora o si empeora. Document Released: 01/25/2013 [facility] Patient Information 2015 [place]. This information is not intended to replace advice given to you by your health care provider. Make sure you discuss any questions you have with your health care provider.

## 2014-10-08 ENCOUNTER — Telehealth: Payer: Self-pay | Admitting: *Deleted

## 2014-10-08 NOTE — Telephone Encounter (Signed)
-----   Message from Lance Bosch, NP sent at 10/01/2014 10:42 PM EDT ----- Not diabetic. Still need to focus on weight and diet

## 2014-10-08 NOTE — Telephone Encounter (Signed)
Pt is aware of her lab results. interpreter [place].

## 2014-10-24 ENCOUNTER — Ambulatory Visit: Payer: Self-pay

## 2015-01-11 ENCOUNTER — Ambulatory Visit: Payer: Self-pay | Attending: Internal Medicine | Admitting: Internal Medicine

## 2015-01-11 ENCOUNTER — Encounter: Payer: Self-pay | Admitting: Internal Medicine

## 2015-01-11 VITALS — BP 129/76 | HR 82 | Temp 98.0°F | Resp 16 | Ht 61.0 in | Wt 210.0 lb

## 2015-01-11 DIAGNOSIS — M722 Plantar fascial fibromatosis: Secondary | ICD-10-CM | POA: Insufficient documentation

## 2015-01-11 MED ORDER — IBUPROFEN 600 MG PO TABS: 600.0000 mg | ORAL_TABLET | Freq: Two times a day (BID) | ORAL | Status: DC | PRN

## 2015-01-11 MED ORDER — PREDNISONE 20 MG PO TABS: 20.0000 mg | ORAL_TABLET | Freq: Every day | ORAL | Status: DC

## 2015-01-11 NOTE — Progress Notes (Signed)
Patient ID: Alexandra Norris, female   DOB: 27-Jun-1975, 39 y.o.   MRN: [id number]  CC: left heel pain   HPI: Alexandra Norris is a 39 y.o. female here today for a follow up visit of left heel pain.  Patient has past medical history of asthma.  Patient reports that that she has had the sensation of needles in her left heel for one month. She states that when she gets out of the bed in the morning her first few steps are extremely painful. She reports that after standing for long periods of time cause the pain to intensify. She denies swelling or injury.   Patient has No headache, No chest pain, No abdominal pain - No Nausea, No new weakness tingling or numbness, No Cough - SOB.  No Known Allergies Past Medical History  Diagnosis Date  . History of bronchitis     "I've stayed in hospital 2 X w/bronchitis"  . Asthma   . History of blood transfusion 1999    w/vaginal delivery   Current Outpatient Prescriptions on File Prior to Visit  Medication Sig Dispense Refill  . albuterol (PROVENTIL HFA;VENTOLIN HFA) 108 (90 BASE) MCG/ACT inhaler Inhale 2 puffs into the lungs every 6 (six) hours as needed for wheezing. 1 Inhaler 3  . cetirizine (ZYRTEC) 10 MG tablet Take 1 tablet (10 mg total) by mouth daily. For allergies 30 tablet 11  . FLUoxetine (PROZAC) 20 MG capsule Take 1 capsule (20 mg total) by mouth daily. (Patient not taking: Reported on 01/11/2015) 30 capsule 0  . fluticasone (FLONASE) 50 MCG/ACT nasal spray Place 2 sprays into both nostrils daily. Allergies (Patient not taking: Reported on 01/11/2015) 16 g 6  . ibuprofen (ADVIL,MOTRIN) 200 MG tablet Take 800 mg by mouth every 6 (six) hours as needed for headache.    . mirtazapine (REMERON) 15 MG tablet Take 1 tablet (15 mg total) by mouth at bedtime. (Patient not taking: Reported on 09/27/2014) 30 tablet 0  . risperiDONE (RISPERDAL) 2 MG tablet Take 1 tablet (2 mg total) by mouth at bedtime. (Patient not taking: Reported on 01/11/2015) 30 tablet  0   No current facility-administered medications on file prior to visit.   Family History  Problem Relation Age of Onset  . Diabetes Mother   . Osteoarthritis Mother   . Hypertension Mother    History   Social History  . Marital Status: Married    Spouse Name: N/A  . Number of Children: N/A  . Years of Education: N/A   Occupational History  . Not on file.   Social History Main Topics  . Smoking status: Never Smoker   . Smokeless tobacco: Never Used  . Alcohol Use: No  . Drug Use: No  . Sexual Activity: Yes    Birth Control/ Protection: None   Other Topics Concern  . Not on file   Social History Narrative    Review of Systems: Other than what is stated in HPI, all other systems are negative.     Objective:   Filed Vitals:   01/11/15 1202  BP: 129/76  Pulse: 82  Temp: 98 F (36.7 C)  Resp: 16    Physical Exam  Constitutional: She is oriented to person, place, and time.  Musculoskeletal: Normal range of motion. She exhibits tenderness (left heel and plantar fascia ). She exhibits no edema.  Neurological: She is alert and oriented to person, place, and time.    Lab Results  Component Value Date   WBC 10.7*  07/05/2014   HGB 12.5 07/05/2014   HCT 37.9 07/05/2014   MCV 80.3 07/05/2014   PLT 258 07/05/2014   Lab Results  Component Value Date   CREATININE 0.41* 07/05/2014   BUN 8 07/05/2014   NA 142 07/05/2014   K 3.9 07/05/2014   CL 115* 07/05/2014   CO2 21 07/05/2014    Lab Results  Component Value Date   HGBA1C 5.8* 09/27/2014   Lipid Panel     Component Value Date/Time   CHOL 143 01/03/2013 1603   TRIG 107 01/03/2013 1603   HDL 48 01/03/2013 1603   CHOLHDL 3.0 01/03/2013 1603   VLDL 21 01/03/2013 1603   LDLCALC 74 01/03/2013 1603       Assessment and plan:   Alexandra Norris was seen today for foot pain.  Diagnoses and all orders for this visit:  Plantar fasciitis, left Orders: -     Begin predniSONE (DELTASONE) 20 MG tablet; Take  1 tablet (20 mg total) by mouth daily with breakfast. -    Begin ibuprofen (ADVIL,MOTRIN) 600 MG tablet; Take 1 tablet (600 mg total) by mouth 2 (two) times daily as needed. Take twice daily for at least 5-7 days I have went over foot exercises with patient to stretch tendon. If no improvement in 6-8 weeks she may call me back and ortho referral may be needed.     Due to language barrier, an interpreter was present during the history-taking and subsequent discussion (and for part of the physical exam) with this patient.  Return if symptoms worsen or fail to improve.   Lance Bosch, [place] [phone] 01/15/2015, 10:40 PM

## 2015-01-11 NOTE — Progress Notes (Signed)
Complaining of lt heel pain, filling like pin and needles worsen with walking x1 month  Neck pain

## 2015-01-11 NOTE — Patient Instructions (Signed)
Fascitis plantar (sndrome del espoln en el taln) con rehabilitacin (Plantar Fasciitis, Heel Spur Syndrome, with Rehab) La fascia plantar es una estructura fibrosa, tipo ligamento, de tejido blando que abarca la parte inferior del pie. La fascitis plantar es una enfermedad que ocasiona dolor en el pie debido a la inflamacin del tejido. SNTOMAS  Dolor y sensibilidad en la planta del pie.  Dolor especialmente al ponerse de pie o caminar. CAUSAS La fascitis plantar est causada por irritacin y lesin en la fascia plantar debajo del pie. Los mecanismos ms frecuentes de una lesin son:  Golpe directo en la planta del pie.  Dao a un pequeo nervio que atraviesa el pie, en el lugar en que la fascia principal se une al hueso del taln.  Estrs aplicado en la fascia plantar debido a espolones seos. EL RIESGO AUMENTA CON:   Actividades que estresan la fascia plantar (correr, saltar, pivotar o cortar).  Poca fuerza y flexibilidad.  Calzado mal ajustado.  Msculos de la pantorrilla tensos.  Pie plano.  No hacer un precalentamiento adecuado.  Obesidad. PREVENCIN  Precalentamiento adecuado y elongacin antes de la actividad.  Descanso y recuperacin entre actividades.  Mantener la forma fsica:  Fuerza, flexibilidad y resistencia muscular.  Capacidad cardiovascular.  Mantenga un peso corporal adecuado.  Evite el estrs en la fascia plantar.  Para deportistas con pie plano, utilizacin de plantillas anatmicas para los arcos. PRONSTICO Si se trata adecuadamente, generalmente es curable sin ciruga. En ocasiones requiere someterse a una ciruga. POSIBLES COMPLICACIONES  La recurrencia frecuente de los sntomas puede dar como resultado un problema crnico.  Problemas en la cintura causados para compensar la lesin, como renguera.  Dolor o debilidad en el pie al adelantar el pie luego de la ciruga.  Inflamacin crnica, cicatrizacin y ruptura parcial o completa  de la fascia, que se produce luego de repetidas inyecciones. TRATAMIENTO El tratamiento inicial incluye el uso de medicamentos y la aplicacin de hielo para reducir el dolor y la inflamacin. Los ejercicios de elongacin y fortalecimiento pueden ayudar a reducir el dolor con la actividad, en especial los dirigidos al tendn de [place]. Los ejercicios pueden realizarse en el hogar o con un terapeuta. El mdico le podr recomendar que utilice tacos o soportes para el arco para ayudar a reducir el estrs de la fascia plantar. En algunos casos se indica una inyeccin de corticoides para reducir la inflamacin. Si los sntomas persisten por ms de 6 meses de tratamiento no quirrgico (conservador), se indicar la ciruga.  MEDICAMENTOS  Si necesita analgsicos, se recomiendan los antiinflamatorios no esteroides, como aspirina e ibuprofeno y otros calmantes menores, como acetaminofeno  No tome medicamentos para el dolor dentro de los 7 das previos a la ciruga.  Los analgsicos prescriptos se indicarn si el mdico lo considera necesario. Utilcelos como se le indique y slo cuando lo necesite.  En algunos casos se indica una inyeccin de corticosteroides. Estas inyecciones deben reservarse para los casos graves, porque slo se pueden administrar una determinada cantidad de veces. CALOR Y FRO  El tratamiento con fro alivia el dolor y reduce la inflamacin. El fro debe aplicarse durante 10 a 15 minutos cada 2  3 horas para reducir la inflamacin y el dolor e inmediatamente despus de cualquier actividad que agrava los sntomas. Utilice bolsas de hielo o masajee la zona con un trozo de hielo (masaje de hielo).  El calor puede usarse antes de elongar y de las actividades de fortalecimiento indicadas por el profesional, le fisioterapeuta   o el entrenador. Utilice una bolsa trmica o sumerja la lesin en agua caliente. SOLICITE ATENCIN MDICA DE INMEDIATO SI:  El tratamiento no lo beneficia, o el  trastorno empeora.  Los medicamentos producen efectos secundarios. EJERCICIOS EJERCICIOS DE AMPLITUD DE MOVIMIENTOS Y ELONGACIN - Fascitis plantar (sndrome del espoln en el taln) Estos ejercicios le ayudarn en la recuperacin de la lesin. Los sntomas podrn aliviarse con o sin asistencia adicional de su mdico, fisioterapeuta o entrenador. Al completar estos ejercicios, recuerde:   Restaurar la flexibilidad del tejido ayuda a que las articulaciones recuperen el movimiento normal. Esto permite que el movimiento y la actividad sea ms saludables y menos dolorosos.  Para que sea efectiva, cada elongacin debe realizarse durante al menos 30 segundos.  La elongacin nunca debe ser dolorosa. Deber sentir slo un alargamiento o distensin suave del tejido que estira. AMPLITUD DE MOVIMIENTOS - Extensin de los dedos - flexin  Sintese con la pierna derecha / izquierdo cruzada sobre la rodilla opuesta.  Tome los dedos de los pies y empjelos hacia usted. Debe sentir un estiramiento suave en la zona inferior de los dedos y del pie.  Mantenga esta posicin durante __________ segundos.  Luego, tome los dedos de los pies y empjelos hacia abajo. Debe sentir un estiramiento suave en la zona superior de los dedos y del pie.  Mantenga esta posicin durante __________ segundos. Reptalo __________ veces. Realice este estiramiento __________ veces por da.  AMPLITUD DE MOVIMIENTOS - Dorsiflexin del tobillo - activa, asistida  Qutese los zapatos y sintese en una silla, preferiblemente en una superficie sin alfombra.  Coloque el pie derecha / izquierdo debajo de la rodilla. Extienda la pierna contraria para estar apoyado.  Con el taln hacia abajo, deslice el pie derecha / izquierdo hacia la silla hasta que sienta un estiramiento en el tobillo o pantorrilla. Si no lo siente, deslice la cadera hacia adelante hasta el borde de la silla, manteniendo el taln hacia abajo.  Mantenga esta posicin  durante __________ segundos. Reptalo __________ veces. Realice este estiramiento __________ veces por da.  ELONGACIN - Gastroc De pie  Coloque las manos en la pared.  Extienda la pierna derecha / izquierdo y mantenga la rodilla levemente flexionada.  Apunte los dedos ligeramente hacia adentro con el pie de atrs.  Mantenga el taln derecha / izquierdo en el suelo y la rodilla recta, cambie el peso hacia la pared y no permita que la espalda se arquee.  Debe sentir un estiramiento en la pantorrila derecha / izquierdo. Mantenga esta posicicin durante __________ segundos. Reptalo __________ veces. Realice este estiramiento __________ veces por da. ELONGACIN - Sleo De pie  Coloque las manos en la pared.  Extienda la pierna derecha / izquierdo y mantenga la rodilla levemente flexionada.  Apunte los dedos ligeramente hacia adentro con el pie de atrs.  Mantenga el taln derecha / izquierdo en el suelo, doble la rodilla de atrs y cambie suavemente el peso sobre la pierna de atrs hasta que sienta un ligero estiramiento en la pantorrilla de atrs.  Mantenga esta posicicin durante __________ segundos. Reptalo __________ veces. Realice este estiramiento __________ veces por da. ELONGACIN - Gastrocsoleus De pie Nota: Este ejercicio puede ser muy estresante para el pie y el tobillo. Realice los ejercicios slo en la forma indicada por el profesional que lo asiste.   Coloque la regin metatarsiana del pie derecha / izquierdo y el otro en el mismo escaln.  Si es necesario, sostngase de la pared o de la barandilla de   una escalera para mantener el equilibrio.  Levante lentamente el otro pie, y permita que el peso del cuerpo presione el taln sobre el borde del escaln.  Debe sentir un estiramiento en la pantorrilla derecha / izquierdo.  Mantenga esta posicicin durante __________ segundos.  Repita este ejercicio con la rodilla derecha / izquierdo levemente flexionada. Reptalo  __________ veces. Realice este estiramiento __________ veces por da.  EJERCICIOS DE FORTALECIMIENTO - Fascitis plantar (sndrome del espoln en el taln) Estos ejercicios le ayudarn en la recuperacin de la lesin. Los sntomas podrn desaparecer con o sin mayor intervencin del profesional, el fisioterapeuta o el entrenador. Al completar estos ejercicios, recuerde:   Los msculos pueden ganar tanto la resistencia como la fortaleza que necesita para sus actividades diarias a travs de ejercicios controlados.  Realice los ejercicios como se lo indic el mdico, el fisioterapeuta o el entrenador. Aumente la resistencia y las repeticiones segn se le haya indicado. FUERZA - Rollo de toalla  Sintese en una silla en una superficie no alfombrada.  Coloque el pie en una toalla, y mantenga el taln en el suelo.  Coloque la toalla alrededor del taln pero envuelva slo los dedos del pie. Mantenga el taln contra el piso.  Aada ____________________ al extremo de la toalla si el mdico, fisioterapeuta o entrenador se lo indican. Reptalo __________ veces. Realice este ejercicio __________ veces por da. FUERZA - Inversin del tobillo  Asegure un extremo de una banda de goma para ejercicios a un objeto fijo (mesa, columna). Ate el extremo opuesto a su pie, justo antes de los dedos.  Coloque los puos entre las rodillas. Esto har que la fuerza se concentre en el tobillo.  Lentamente, tire del dedo gordo hacia arriba y hacia adentro y asegrese de que la banda est posicionada de tal forma que puede resistir el movimiento completo.  Mantenga esta posicicin durante __________ segundos.  Haga que los msculos resistan la banda mientras tira lentamente el pie hacia atrs hasta la posicin inicial. Reptalo __________ veces. Realice este ejercicio __________ veces por da.  Document Released: 03/11/2006 Document Revised: 08/17/2011 [employer] Patient Information 2015 [employer]. This information  is not intended to replace advice given to you by your health care provider. Make sure you discuss any questions you have with your health care provider.  

## 2015-01-29 ENCOUNTER — Other Ambulatory Visit: Payer: Self-pay

## 2015-02-05 ENCOUNTER — Other Ambulatory Visit: Payer: Self-pay

## 2015-02-20 ENCOUNTER — Ambulatory Visit: Payer: Self-pay | Admitting: Internal Medicine

## 2015-02-27 ENCOUNTER — Ambulatory Visit: Payer: Self-pay | Admitting: Internal Medicine

## 2015-03-14 ENCOUNTER — Ambulatory Visit: Payer: Self-pay | Admitting: Internal Medicine

## 2015-07-07 ENCOUNTER — Encounter (HOSPITAL_COMMUNITY): Payer: Self-pay | Admitting: Emergency Medicine

## 2015-07-07 ENCOUNTER — Emergency Department (INDEPENDENT_AMBULATORY_CARE_PROVIDER_SITE_OTHER)
Admission: EM | Admit: 2015-07-07 | Discharge: 2015-07-07 | Disposition: A | Discharge status: Home / Self Care | Payer: Self-pay | Source: Home / Self Care | Attending: Emergency Medicine | Admitting: Emergency Medicine

## 2015-07-07 DIAGNOSIS — M7652 Patellar tendinitis, left knee: Secondary | ICD-10-CM

## 2015-07-07 MED ORDER — DICLOFENAC SODIUM 50 MG PO TBEC: 50.0000 mg | DELAYED_RELEASE_TABLET | Freq: Three times a day (TID) | ORAL | Status: DC

## 2015-07-07 NOTE — ED Provider Notes (Signed)
CSN: [id number]     Arrival date & time 07/07/15  1306 History   First MD Initiated Contact with Patient 07/07/15 1341     Chief Complaint  Patient presents with  . Knee Pain   (Consider location/radiation/quality/duration/timing/severity/associated sxs/prior Treatment) HPI  She is a 40 year old woman here for evaluation of left knee pain.  She states this is been intermittent for about a month, but worse in the last week. She states she started going to the gym last week doing Zumba classes.  The pain is located in the anterior knee. It is worse with knee extension and stairs. She states she will sometimes get swelling in the knee. It does improve with Tylenol. No knee injury or trauma.  Past Medical History  Diagnosis Date  . History of bronchitis     "I've stayed in hospital 2 X w/bronchitis"  . Asthma   . History of blood transfusion 1999    w/vaginal delivery   Past Surgical History  Procedure Laterality Date  . Cholecystectomy  2011  . Cesarean section  1997; 2008  . Tubal ligation  04/2007   Family History  Problem Relation Age of Onset  . Diabetes Mother   . Osteoarthritis Mother   . Hypertension Mother    Social History  Substance Use Topics  . Smoking status: Never Smoker   . Smokeless tobacco: Never Used  . Alcohol Use: No   OB History    No data available     Review of Systems As in history of present illness Allergies  Review of patient's allergies indicates no known allergies.  Home Medications   Prior to Admission medications   Medication Sig Start Date End Date Taking? Authorizing Provider  albuterol (PROVENTIL HFA;VENTOLIN HFA) 108 (90 BASE) MCG/ACT inhaler Inhale 2 puffs into the lungs every 6 (six) hours as needed for wheezing. 09/27/14   Lance Bosch, NP  cetirizine (ZYRTEC) 10 MG tablet Take 1 tablet (10 mg total) by mouth daily. For allergies 09/27/14   Lance Bosch, NP  diclofenac (VOLTAREN) 50 MG EC tablet Take 1 tablet (50 mg total) by  mouth 3 (three) times daily. For 5 days, then as needed. 07/07/15   Melony Overly, MD  fluticasone (FLONASE) 50 MCG/ACT nasal spray Place 2 sprays into both nostrils daily. Allergies Patient not taking: Reported on 01/11/2015 09/27/14   Lance Bosch, NP  ibuprofen (ADVIL,MOTRIN) 600 MG tablet Take 1 tablet (600 mg total) by mouth 2 (two) times daily as needed. Take twice daily for at least 5-7 days 01/11/15   Lance Bosch, NP  mirtazapine (REMERON) 15 MG tablet Take 1 tablet (15 mg total) by mouth at bedtime. Patient not taking: Reported on 09/27/2014 07/09/14   Niel Hummer, NP  predniSONE (DELTASONE) 20 MG tablet Take 1 tablet (20 mg total) by mouth daily with breakfast. 01/11/15   Lance Bosch, NP  risperiDONE (RISPERDAL) 2 MG tablet Take 1 tablet (2 mg total) by mouth at bedtime. Patient not taking: Reported on 01/11/2015 07/09/14   Niel Hummer, NP   Meds Ordered and Administered this Visit  Medications - No data to display  BP 129/70 mmHg  Pulse 74  Temp(Src) 98.6 F (37 C) (Oral)  Resp 18  SpO2 98% No data found.   Physical Exam  Constitutional: She is oriented to person, place, and time. She appears well-developed and well-nourished. No distress.  Cardiovascular: Normal rate.   Pulmonary/Chest: Effort normal.  Musculoskeletal:  Left  knee: No edema. No appreciable joint effusion. She has full active range of motion, but discomfort with extension. She is tender over the patellar tendon. No patella tenderness or quadricep tendon tenderness.  No joint laxity. She does have some very mild tenderness over the lateral joint line.  Neurological: She is alert and oriented to person, place, and time.    ED Course  Procedures (including critical care time)  Labs Review Labs Reviewed - No data to display  Imaging Review No results found.   MDM   1. Patellar tendinitis of left knee    History and exam consistent with patellar tendinitis. Treat with diclofenac, ice, and knee  brace. Recommended no Zumba for the next week. The elliptical or treadmill can be used as tolerated. Follow-up as needed.    Melony Overly, MD 07/07/15 [phone]

## 2015-07-07 NOTE — Discharge Instructions (Signed)
You have patellar tendinitis. Please wear the knee brace when you are moving around. Take diclofenac 3 times a day for the next 5 days, then as needed. Apply ice to your knee 3 times a day. No Zumba the for the next week. The treadmill or elliptical would be okay as tolerated. This will gradually improve. Follow-up as needed.

## 2015-07-07 NOTE — ED Notes (Signed)
Here with progressive left knee pain that started intermit last month States since going to the gym, pain is getting worse with standing, walking upstairs and flexion  Swelling noted relieved some with Tylenol

## 2015-07-24 ENCOUNTER — Encounter: Payer: Self-pay | Admitting: Internal Medicine

## 2015-08-12 ENCOUNTER — Encounter (HOSPITAL_COMMUNITY): Payer: Self-pay | Admitting: Emergency Medicine

## 2015-08-12 ENCOUNTER — Emergency Department (INDEPENDENT_AMBULATORY_CARE_PROVIDER_SITE_OTHER)
Admission: EM | Admit: 2015-08-12 | Discharge: 2015-08-12 | Disposition: A | Discharge status: Home / Self Care | Payer: Self-pay | Source: Home / Self Care | Attending: Family Medicine | Admitting: Family Medicine

## 2015-08-12 DIAGNOSIS — J111 Influenza due to unidentified influenza virus with other respiratory manifestations: Secondary | ICD-10-CM

## 2015-08-12 MED ORDER — OSELTAMIVIR PHOSPHATE 75 MG PO CAPS: 75.0000 mg | ORAL_CAPSULE | Freq: Two times a day (BID) | ORAL | Status: DC

## 2015-08-12 NOTE — ED Notes (Signed)
Pt here with flu-like sx's that started 2 days ago Constant cough with mid sternal chest pain, headache, generalized body aches all over with chills Taking over the counter Tylenol and Advil with no relief  Temp 100.4

## 2015-08-12 NOTE — Discharge Instructions (Signed)
Gripe - Adultos  (Influenza, Adult)  La gripe es una infección viral del tracto respiratorio. Ocurre con más frecuencia en los meses de invierno, ya que las personas pasan más tiempo en contacto cercano. La gripe puede enfermarlo considerablemente. Se transmite fácilmente de una persona a otra (es contagiosa).  CAUSAS   La causa es un virus que infecta el tracto respiratorio. Puede contagiarse el virus al aspirar las gotitas que una persona infectada elimina al toser o estornudar. También puede contagiarse al tocar algo que fue recientemente contaminado con el virus y luego llevarse la mano a la boca, la nariz o los ojos.  RIESGOS Y COMPLICACIONES  Tendrá mayor riesgo de sufrir un resfrío grave si consume cigarrillos, es diabético, sufre una enfermedad cardíaca (como insuficiencia cardíaca) o pulmonar crónica (como asma) o si tiene debilitado el sistema inmunológico. Los ancianos y las mujeres embarazadas tienen más riesgo de sufrir infecciones graves. El problema más frecuente de la gripe es la infección pulmonar (neumonía). En algunos casos, este problema puede requerir atención médica de emergencia y poner en peligro la vida.  SIGNOS Y SÍNTOMAS   Los síntomas pueden durar entre 4 y 10 días y pueden ser:  · Fiebre.  · Escalofríos.  · Dolor de cabeza, dolores en el cuerpo y musculares.  · Dolor de garganta.  · Molestias en el pecho y tos.  · Pérdida del apetito.  · Debilidad o cansancio.  · Mareos.  · Náuseas o vómitos.  DIAGNÓSTICO   El diagnóstico se realiza según su historia clínica y un examen físico. Es necesario realizar un análisis de cultivo faríngeo o nasal para confirmar el diagnóstico.  TRATAMIENTO   En los casos leves, la gripe se cura sin tratamiento. El tratamiento está dirigido a aliviar los síntomas. En los casos más graves, el médico podrá recetar medicamentos antivirales para acortar el curso de la enfermedad. Los antibióticos no son eficaces, ya que la infección está causada por un virus y no una  bacteria.  INSTRUCCIONES PARA EL CUIDADO EN EL HOGAR   · Tome los medicamentos solamente como se lo haya indicado el médico.  · Utilice un humidificador de niebla fría para facilitar la respiración.  · Haga reposo hasta que la temperatura vuelva a ser normal. Generalmente esto lleva entre 3 y 4 días.  · Beba suficiente líquido para mantener la orina clara o de color amarillo pálido.  · Cúbrase la boca y la nariz al toser o estornudar, y lávese las manos muy bien para evitar que se propague el virus.  · Quédese en su casa y no concurra al trabajo o a la escuela hasta que la fiebre haya desaparecido al menos por un día completo.  PREVENCIÓN   La vacunación anual contra la gripe es la mejor manera de evitar enfermarse. Se recomienda ahora de manera rutinaria una vacuna anual contra la gripe a todos los adultos estadounidenses.  SOLICITE ATENCIÓN MÉDICA SI:  · Tiene dolor en el pecho, la tos empeora o tiene más mucosidad.  · Tiene náuseas, vómitos o diarrea.  · La fiebre regresa o empeora.  SOLICITE ATENCIÓN MÉDICA DE INMEDIATO SI:   · Tiene dificultad para respirar, le falta el aire o tiene la piel o las uñas azuladas.  · Presenta dolor intenso o entumecimiento en el cuello.  · Le duele la cabeza de forma repentina o tiene dolor en la cara o el oído.  · Tiene náuseas o vómitos que no puede controlar.  ASEGÚRESE DE QUE:   · Comprende   estas instrucciones.  · Controlará su afección.  · Recibirá ayuda de inmediato si no mejora o si empeora.     Esta información no tiene como fin reemplazar el consejo del médico. Asegúrese de hacerle al médico cualquier pregunta que tenga.     Document Released: 03/04/2005 Document Revised: 06/15/2014  Elsevier Interactive Patient Education ©2016 [employer]

## 2015-08-12 NOTE — ED Provider Notes (Signed)
CSN: [id number]     Arrival date & time 08/12/15  106 History   First MD Initiated Contact with Patient 08/12/15 1930     Chief Complaint  Patient presents with  . Cough  . Influenza   (Consider location/radiation/quality/duration/timing/severity/associated sxs/prior Treatment) HPI Pt presents with sore throat, body aches, fever, chills for 2 days Home treatment has been OTC meds without much relief of symptoms Fever is improved for short periods of time with OTC antipyretics. Pain score is 4 mostly from coughing and body aches Taking fluids, no appetite No flu shot Has been exposed to others with similar symptoms.  Denies: CP, SOB, vomiting or diarrhea.  Past Medical History  Diagnosis Date  . History of bronchitis     "I've stayed in hospital 2 X w/bronchitis"  . Asthma   . History of blood transfusion 1999    w/vaginal delivery   Past Surgical History  Procedure Laterality Date  . Cholecystectomy  2011  . Cesarean section  1997; 2008  . Tubal ligation  04/2007   Family History  Problem Relation Age of Onset  . Diabetes Mother   . Osteoarthritis Mother   . Hypertension Mother    Social History  Substance Use Topics  . Smoking status: Never Smoker   . Smokeless tobacco: Never Used  . Alcohol Use: No   OB History    No data available     Review of Systems Cough, fever, body aches Allergies  Review of patient's allergies indicates no known allergies.  Home Medications   Prior to Admission medications   Medication Sig Start Date End Date Taking? Authorizing Provider  albuterol (PROVENTIL HFA;VENTOLIN HFA) 108 (90 BASE) MCG/ACT inhaler Inhale 2 puffs into the lungs every 6 (six) hours as needed for wheezing. 09/27/14   Lance Bosch, NP  cetirizine (ZYRTEC) 10 MG tablet Take 1 tablet (10 mg total) by mouth daily. For allergies 09/27/14   Lance Bosch, NP  diclofenac (VOLTAREN) 50 MG EC tablet Take 1 tablet (50 mg total) by mouth 3 (three) times daily. For 5  days, then as needed. 07/07/15   Melony Overly, MD  fluticasone (FLONASE) 50 MCG/ACT nasal spray Place 2 sprays into both nostrils daily. Allergies Patient not taking: Reported on 01/11/2015 09/27/14   Lance Bosch, NP  ibuprofen (ADVIL,MOTRIN) 600 MG tablet Take 1 tablet (600 mg total) by mouth 2 (two) times daily as needed. Take twice daily for at least 5-7 days 01/11/15   Lance Bosch, NP  mirtazapine (REMERON) 15 MG tablet Take 1 tablet (15 mg total) by mouth at bedtime. Patient not taking: Reported on 09/27/2014 07/09/14   Niel Hummer, NP  oseltamivir (TAMIFLU) 75 MG capsule Take 1 capsule (75 mg total) by mouth every 12 (twelve) hours. 08/12/15   Konrad Felix, PA  predniSONE (DELTASONE) 20 MG tablet Take 1 tablet (20 mg total) by mouth daily with breakfast. 01/11/15   Lance Bosch, NP  risperiDONE (RISPERDAL) 2 MG tablet Take 1 tablet (2 mg total) by mouth at bedtime. Patient not taking: Reported on 01/11/2015 07/09/14   Niel Hummer, NP   Meds Ordered and Administered this Visit  Medications - No data to display  BP 137/78 mmHg  Pulse 102  Temp(Src) 100.4 F (38 C) (Oral)  Resp 18  SpO2 100%  LMP 07/25/2015 No data found.   Physical Exam NURSES NOTES AND VITAL SIGNS REVIEWED. CONSTITUTIONAL: Well developed, well nourished, no acute distress HEENT: normocephalic,  atraumatic, right and left TM's are normal EYES: Conjunctiva normal NECK:normal ROM, supple, no adenopathy PULMONARY:No respiratory distress, normal effort, Lungs: CTAb/l, no wheezes, or increased work of breathing CARDIOVASCULAR: RRR, no murmur ABDOMEN: soft, ND, NT, +'ve BS MUSCULOSKELETAL: Normal ROM of all extremities,  SKIN: warm and dry without rash PSYCHIATRIC: Mood and affect, behavior are normal  ED Course  Procedures (including critical care time)  Labs Review Labs Reviewed - No data to display  Imaging Review No results found.   Visual Acuity Review  Right Eye Distance:   Left Eye Distance:    Bilateral Distance:    Right Eye Near:   Left Eye Near:    Bilateral Near:         MDM   1. Flu    Patient is reassured that there are no issues that require transfer to higher level of care at this time.  Patient is advised to continue home symptomatic treatment. Prescription is sent to  pharmacy patient has indicated.  Patient is advised that if there are new or worsening symptoms or attend the emergency department, or contact primary care provider. Instructions of care provided discharged home in stable condition. Return to work/school note provided.  THIS NOTE WAS GENERATED USING A VOICE RECOGNITION SOFTWARE PROGRAM. ALL REASONABLE EFFORTS  WERE MADE TO PROOFREAD THIS DOCUMENT FOR ACCURACY.     Konrad Felix, [place] 08/13/15 [phone]

## 2015-08-14 ENCOUNTER — Encounter (HOSPITAL_COMMUNITY): Payer: Self-pay | Admitting: Emergency Medicine

## 2015-08-14 ENCOUNTER — Emergency Department (HOSPITAL_COMMUNITY)
Admission: EM | Admit: 2015-08-14 | Discharge: 2015-08-15 | Disposition: A | Discharge status: Home / Self Care | Payer: Self-pay | Attending: Emergency Medicine | Admitting: Emergency Medicine

## 2015-08-14 DIAGNOSIS — Z79899 Other long term (current) drug therapy: Secondary | ICD-10-CM | POA: Insufficient documentation

## 2015-08-14 DIAGNOSIS — J45909 Unspecified asthma, uncomplicated: Secondary | ICD-10-CM | POA: Insufficient documentation

## 2015-08-14 DIAGNOSIS — Z7952 Long term (current) use of systemic steroids: Secondary | ICD-10-CM | POA: Insufficient documentation

## 2015-08-14 DIAGNOSIS — H1089 Other conjunctivitis: Secondary | ICD-10-CM | POA: Insufficient documentation

## 2015-08-14 DIAGNOSIS — J111 Influenza due to unidentified influenza virus with other respiratory manifestations: Secondary | ICD-10-CM | POA: Insufficient documentation

## 2015-08-14 MED ORDER — OSELTAMIVIR PHOSPHATE 75 MG PO CAPS
75.0000 mg | ORAL_CAPSULE | Freq: Once | ORAL | Status: AC
  Administered 2015-08-15: 75 mg via ORAL
  Filled 2015-08-14: qty 1

## 2015-08-14 MED ORDER — SODIUM CHLORIDE 0.9 % IV SOLN
INTRAVENOUS | Status: DC
  Administered 2015-08-15: 01:00:00 via INTRAVENOUS

## 2015-08-14 MED ORDER — SODIUM CHLORIDE 0.9 % IV BOLUS (SEPSIS)
1000.0000 mL | Freq: Once | INTRAVENOUS | Status: AC
  Administered 2015-08-15: 1000 mL via INTRAVENOUS

## 2015-08-14 NOTE — ED Notes (Signed)
Pt states she was seen at [facility] on Monday and was told she had the flu and was sent home on medication  Pt states she feels worse  Pt states she has vomiting, chest soreness, headache, general weakness, blurred vision, shortness of breath, and cough  Pt states she is so weak she fell this morning

## 2015-08-14 NOTE — ED Provider Notes (Signed)
CSN: [id number]     Arrival date & time 08/14/15  1811 History   First MD Initiated Contact with Patient 08/14/15 2317     Chief Complaint  Patient presents with  . Influenza     (Consider location/radiation/quality/duration/timing/severity/associated sxs/prior Treatment) HPI   Alexandra Norris is a 40 y.o. femaleWas been ill for several days, and is currently being treated for influenza, with Tamiflu. She complains of generalized achy feeling, sore throat, itchy eyes and nausea with some vomiting.  She denies weakness or dizziness. Her cough is nonproductive. There are no other known modifying factors.   Past Medical History  Diagnosis Date  . History of bronchitis     "I've stayed in hospital 2 X w/bronchitis"  . Asthma   . History of blood transfusion 1999    w/vaginal delivery   Past Surgical History  Procedure Laterality Date  . Cholecystectomy  2011  . Cesarean section  1997; 2008  . Tubal ligation  04/2007   Family History  Problem Relation Age of Onset  . Diabetes Mother   . Osteoarthritis Mother   . Hypertension Mother    Social History  Substance Use Topics  . Smoking status: Never Smoker   . Smokeless tobacco: Never Used  . Alcohol Use: No   OB History    No data available     Review of Systems  All other systems reviewed and are negative.     Allergies  Review of patient's allergies indicates no known allergies.  Home Medications   Prior to Admission medications   Medication Sig Start Date End Date Taking? Authorizing Provider  albuterol (PROVENTIL HFA;VENTOLIN HFA) 108 (90 BASE) MCG/ACT inhaler Inhale 2 puffs into the lungs every 6 (six) hours as needed for wheezing. 09/27/14   Lance Bosch, NP  cetirizine (ZYRTEC) 10 MG tablet Take 1 tablet (10 mg total) by mouth daily. For allergies 09/27/14   Lance Bosch, NP  diclofenac (VOLTAREN) 50 MG EC tablet Take 1 tablet (50 mg total) by mouth 3 (three) times daily. For 5 days, then as needed.  07/07/15   Melony Overly, MD  fluticasone (FLONASE) 50 MCG/ACT nasal spray Place 2 sprays into both nostrils daily. Allergies Patient not taking: Reported on 01/11/2015 09/27/14   Lance Bosch, NP  ibuprofen (ADVIL,MOTRIN) 600 MG tablet Take 1 tablet (600 mg total) by mouth 2 (two) times daily as needed. Take twice daily for at least 5-7 days 01/11/15   Lance Bosch, NP  mirtazapine (REMERON) 15 MG tablet Take 1 tablet (15 mg total) by mouth at bedtime. Patient not taking: Reported on 09/27/2014 07/09/14   Niel Hummer, NP  oseltamivir (TAMIFLU) 75 MG capsule Take 1 capsule (75 mg total) by mouth every 12 (twelve) hours. 08/12/15   Konrad Felix, PA  predniSONE (DELTASONE) 20 MG tablet Take 1 tablet (20 mg total) by mouth daily with breakfast. 01/11/15   Lance Bosch, NP  risperiDONE (RISPERDAL) 2 MG tablet Take 1 tablet (2 mg total) by mouth at bedtime. Patient not taking: Reported on 01/11/2015 07/09/14   Niel Hummer, NP   BP 124/71 mmHg  Pulse 84  Temp(Src) 98.2 F (36.8 C) (Oral)  Resp 18  Ht 5\' 1"  (1.549 m)  SpO2 96%  LMP 07/25/2015 (Exact Date) Physical Exam  Constitutional: She is oriented to person, place, and time. She appears well-developed and well-nourished.  HENT:  Head: Normocephalic and atraumatic.  Right Ear: External ear normal.  Left  Ear: External ear normal.  No oropharynx swelling, deformity or erythema.  Eyes: Conjunctivae and EOM are normal. Pupils are equal, round, and reactive to light.  Bilateral scleral conjunctival irritation, without purulent discharge.  Neck: Normal range of motion and phonation normal. Neck supple.  Cardiovascular: Normal rate, regular rhythm and normal heart sounds.   Pulmonary/Chest: Effort normal and breath sounds normal. No respiratory distress. She exhibits no bony tenderness.  Occasional nonproductive cough.  Abdominal: Soft. There is no tenderness.  Musculoskeletal: Normal range of motion. She exhibits no tenderness.  Neurological: She  is alert and oriented to person, place, and time. No cranial nerve deficit or sensory deficit. She exhibits normal muscle tone. Coordination normal.  Skin: Skin is warm, dry and intact.  Psychiatric: She has a normal mood and affect. Her behavior is normal. Judgment and thought content normal.  Nursing note and vitals reviewed.   ED Course  Procedures (including critical care time)  Medications  sodium chloride 0.9 % bolus 1,000 mL (0 mLs Intravenous Stopped 08/15/15 0106)  oseltamivir (TAMIFLU) capsule 75 mg (75 mg Oral Given 08/15/15 0013)    No data found.   11:39 PM Reevaluation with update and discussion. After initial assessment and treatment, an updated evaluation reveals no change in clinical status. Findings discussed with the patient, all questions were answered. [place] Review Labs Reviewed - No data to display  Imaging Review No results found. I have personally reviewed and evaluated these images and lab results as part of my medical decision-making.   EKG Interpretation None      MDM   Final diagnoses:  Influenza    Evaluation consistent with influenza, without complicating features. Patient has been treated with Tamiflu for a short period of time. Doubt serious. Spectral infection, metabolic instability or impending vascular collapse.   Nursing Notes Reviewed/ Care Coordinated Applicable Imaging Reviewed Interpretation of Laboratory Data incorporated into ED treatment  The patient appears reasonably screened and/or stabilized for discharge and I doubt any other medical condition or other [facility] requiring further screening, evaluation, or treatment in the ED at this time prior to discharge.  Plan: Home Medications- usual; Home Treatments- rest; return here if the recommended treatment, does not improve the symptoms; Recommended follow up- PCP prn    Daleen Bo, MD 08/17/15 2142

## 2015-08-15 NOTE — Discharge Instructions (Signed)
Get plenty of rest and drink a lot of fluids. Use Tylenol, or Motrin, as needed for fever or pain Use Robitussin-DM, for cough.  Gripe - Adultos (Influenza, Adult) La gripe es una infeccin viral del tracto respiratorio. Ocurre con ms frecuencia en los meses de invierno, ya que las personas pasan ms tiempo en contacto cercano. La gripe puede enfermarlo considerablemente. Se transmite fcilmente de [place] persona a otra (es contagiosa). CAUSAS  La causa es un virus que infecta el tracto respiratorio. Puede contagiarse el virus al aspirar las gotitas que una persona infectada elimina al toser o Brewing technologist. Tambin puede contagiarse al tocar algo que fue recientemente contaminado con el virus y [employer] mano a la boca, la nariz o los ojos. RIESGOS Y COMPLICACIONES Tendr mayor riesgo de sufrir un resfro grave si consume cigarrillos, es diabtico, sufre una enfermedad cardaca (como insuficiencia cardaca) o pulmonar crnica (como asma) o si tiene debilitado el sistema inmunolgico. Los ancianos y las mujeres embarazadas tienen ms riesgo de sufrir infecciones graves. El problema ms frecuente de la gripe es la infeccin pulmonar (neumona). En algunos casos, este problema puede requerir atencin mdica de emergencia y Ship broker en peligro la vida. [place]  Los sntomas pueden durar entre 4 y 42 das y pueden ser:  Cristy Hilts.  Escalofros.  Dolor de [place], dolores en el cuerpo y musculares.  Dolor de Investment banker, operational.  Molestias en el pecho y tos.  Prdida del apetito.  Debilidad o cansancio.  Mareos.  Nuseas o vmitos. DIAGNSTICO  El diagnstico se realiza segn su historia clnica y un examen fsico. Es necesario realizar un anlisis de cultivo farngeo o nasal para confirmar el diagnstico. TRATAMIENTO  En los casos leves, la gripe se cura sin Clinical research associate. El tratamiento est dirigido a Herbalist sntomas. En los casos ms graves, el mdico podr recetar medicamentos  antivirales para acortar el curso de la enfermedad. Los antibiticos no son eficaces, ya que la infeccin est causada por un virus y no una bacteria. Crisman los medicamentos solamente como se lo haya indicado el mdico.  Utilice un humidificador de niebla fra para facilitar la respiracin.  Haga reposo hasta que la temperatura vuelva a ser normal. Generalmente esto lleva entre 3 y 4 das.  Beba suficiente lquido para Consulting civil engineer orina clara o de color amarillo plido.  Cbrase la boca y la nariz al toser o Brewing technologist, y [employer] manos muy bien para evitar que se propague el virus.  Foy [place] en su casa y no concurra al [place] o a la escuela hasta que la fiebre haya desaparecido al menos por un da completo. PREVENCIN  La vacunacin anual contra la gripe es la mejor manera de evitar enfermarse. Se recomienda ahora de manera rutinaria una vacuna anual contra la gripe a todos los [employer]. SOLICITE ATENCIN MDICA SI:  Tiene dolor en el pecho, la tos empeora o tiene ms mucosidad.  Tiene nuseas, vmitos o diarrea.  La fiebre regresa o empeora. SOLICITE ATENCIN MDICA DE INMEDIATO SI:   Tiene dificultad para respirar, le falta el [place] uas [place].  Presenta dolor intenso o entumecimiento en el cuello.  Le duele la cabeza de forma repentina o tiene dolor en la cara o el odo.  Tiene nuseas o vmitos que no puede controlar. ASEGRESE DE QUE:   Comprende estas instrucciones.  Controlar su afeccin.  Recibir ayuda de inmediato si no mejora o si  empeora.   Esta informacin no tiene Marine scientist el consejo del mdico. Asegrese de hacerle al mdico cualquier pregunta que tenga.   Document Released: 03/04/2005 Document Revised: 06/15/2014 Elsevier Interactive Patient Education [employer].

## 2015-08-16 ENCOUNTER — Ambulatory Visit: Payer: Self-pay

## 2015-08-20 ENCOUNTER — Inpatient Hospital Stay: Payer: Self-pay | Admitting: Internal Medicine

## 2015-08-26 ENCOUNTER — Inpatient Hospital Stay: Payer: Self-pay | Admitting: Internal Medicine

## 2015-09-05 ENCOUNTER — Ambulatory Visit: Payer: Self-pay | Attending: Internal Medicine | Admitting: Internal Medicine

## 2015-09-05 ENCOUNTER — Encounter: Payer: Self-pay | Admitting: Internal Medicine

## 2015-09-05 VITALS — BP 98/64 | HR 67 | Temp 98.0°F | Resp 16 | Ht 61.0 in | Wt 188.2 lb

## 2015-09-05 DIAGNOSIS — Z8249 Family history of ischemic heart disease and other diseases of the circulatory system: Secondary | ICD-10-CM | POA: Insufficient documentation

## 2015-09-05 DIAGNOSIS — R062 Wheezing: Secondary | ICD-10-CM | POA: Insufficient documentation

## 2015-09-05 DIAGNOSIS — Z8489 Family history of other specified conditions: Secondary | ICD-10-CM | POA: Insufficient documentation

## 2015-09-05 DIAGNOSIS — J111 Influenza due to unidentified influenza virus with other respiratory manifestations: Secondary | ICD-10-CM | POA: Insufficient documentation

## 2015-09-05 DIAGNOSIS — Z79899 Other long term (current) drug therapy: Secondary | ICD-10-CM | POA: Insufficient documentation

## 2015-09-05 DIAGNOSIS — J309 Allergic rhinitis, unspecified: Secondary | ICD-10-CM | POA: Insufficient documentation

## 2015-09-05 DIAGNOSIS — J45909 Unspecified asthma, uncomplicated: Secondary | ICD-10-CM | POA: Insufficient documentation

## 2015-09-05 DIAGNOSIS — Z7951 Long term (current) use of inhaled steroids: Secondary | ICD-10-CM | POA: Insufficient documentation

## 2015-09-05 DIAGNOSIS — Z9889 Other specified postprocedural states: Secondary | ICD-10-CM | POA: Insufficient documentation

## 2015-09-05 DIAGNOSIS — Z833 Family history of diabetes mellitus: Secondary | ICD-10-CM | POA: Insufficient documentation

## 2015-09-05 DIAGNOSIS — R5383 Other fatigue: Secondary | ICD-10-CM | POA: Insufficient documentation

## 2015-09-05 DIAGNOSIS — J40 Bronchitis, not specified as acute or chronic: Secondary | ICD-10-CM | POA: Insufficient documentation

## 2015-09-05 MED ORDER — CETIRIZINE HCL 10 MG PO TABS: 10.0000 mg | ORAL_TABLET | Freq: Every day | ORAL | Status: DC

## 2015-09-05 MED ORDER — ALBUTEROL SULFATE HFA 108 (90 BASE) MCG/ACT IN AERS: 2.0000 | INHALATION_SPRAY | Freq: Four times a day (QID) | RESPIRATORY_TRACT | Status: DC | PRN

## 2015-09-05 NOTE — Progress Notes (Signed)
Patient's here for ED f/up for the flu.   Patient's concern with low blood pressure, feeling fatigued.  Patient requesting med refills.

## 2015-09-05 NOTE — Progress Notes (Signed)
Patient ID: Alexandra Norris, female   DOB: 1975-10-09, 40 y.o.   MRN: [id number]  CC: ED f/u  HPI: Alexandra Norris is a 40 y.o. female here today for a follow up visit.  Patient has past medical history of asthma. Patient reports that on 3/8 she was in the ER and was diagnosed with Influenza and was treated with Tamiflu. She now feels much better and is symptoms free. Patient states that four days ago she began to feel weak like she did not have energy to get up. She has not felt sick. She states that her menstrual periods have become heavy and longer for the past couple of months.   No Known Allergies Past Medical History  Diagnosis Date  . History of bronchitis     "I've stayed in hospital 2 X w/bronchitis"  . Asthma   . History of blood transfusion 1999    w/vaginal delivery   Current Outpatient Prescriptions on File Prior to Visit  Medication Sig Dispense Refill  . albuterol (PROVENTIL HFA;VENTOLIN HFA) 108 (90 BASE) MCG/ACT inhaler Inhale 2 puffs into the lungs every 6 (six) hours as needed for wheezing. 1 Inhaler 3  . cetirizine (ZYRTEC) 10 MG tablet Take 1 tablet (10 mg total) by mouth daily. For allergies 30 tablet 11  . diclofenac (VOLTAREN) 50 MG EC tablet Take 1 tablet (50 mg total) by mouth 3 (three) times daily. For 5 days, then as needed. 30 tablet 0  . ibuprofen (ADVIL,MOTRIN) 600 MG tablet Take 1 tablet (600 mg total) by mouth 2 (two) times daily as needed. Take twice daily for at least 5-7 days 30 tablet 1  . fluticasone (FLONASE) 50 MCG/ACT nasal spray Place 2 sprays into both nostrils daily. Allergies (Patient not taking: Reported on 01/11/2015) 16 g 6  . mirtazapine (REMERON) 15 MG tablet Take 1 tablet (15 mg total) by mouth at bedtime. (Patient not taking: Reported on 09/27/2014) 30 tablet 0  . oseltamivir (TAMIFLU) 75 MG capsule Take 1 capsule (75 mg total) by mouth every 12 (twelve) hours. (Patient not taking: Reported on 09/05/2015) 10 capsule 0  . predniSONE  (DELTASONE) 20 MG tablet Take 1 tablet (20 mg total) by mouth daily with breakfast. (Patient not taking: Reported on 09/05/2015) 5 tablet 0  . risperiDONE (RISPERDAL) 2 MG tablet Take 1 tablet (2 mg total) by mouth at bedtime. (Patient not taking: Reported on 01/11/2015) 30 tablet 0   No current facility-administered medications on file prior to visit.   Family History  Problem Relation Age of Onset  . Diabetes Mother   . Osteoarthritis Mother   . Hypertension Mother    Social History   Social History  . Marital Status: Married    Spouse Name: N/A  . Number of Children: N/A  . Years of Education: N/A   Occupational History  . Not on file.   Social History Main Topics  . Smoking status: Never Smoker   . Smokeless tobacco: Never Used  . Alcohol Use: No  . Drug Use: No  . Sexual Activity: Yes    Birth Control/ Protection: None   Other Topics Concern  . Not on file   Social History Narrative    Review of Systems: Other than what is stated in HPI, all other systems are negative.   Objective:   Filed Vitals:   09/05/15 1627  BP: 98/64  Pulse: 67  Temp: 98 F (36.7 C)  Resp: 16    Physical Exam  Constitutional: She is  oriented to person, place, and time.  Cardiovascular: Normal rate, regular rhythm and normal heart sounds.   Pulmonary/Chest: Effort normal and breath sounds normal.  Musculoskeletal: She exhibits no edema.  Neurological: She is alert and oriented to person, place, and time.  Skin: Skin is warm and dry.  Psychiatric: She has a normal mood and affect.     Lab Results  Component Value Date   WBC 10.7* 07/05/2014   HGB 12.5 07/05/2014   HCT 37.9 07/05/2014   MCV 80.3 07/05/2014   PLT 258 07/05/2014   Lab Results  Component Value Date   CREATININE 0.41* 07/05/2014   BUN 8 07/05/2014   NA 142 07/05/2014   K 3.9 07/05/2014   CL 115* 07/05/2014   CO2 21 07/05/2014    Lab Results  Component Value Date   HGBA1C 5.8* 09/27/2014   Lipid  Panel     Component Value Date/Time   CHOL 143 01/03/2013 1603   TRIG 107 01/03/2013 1603   HDL 48 01/03/2013 1603   CHOLHDL 3.0 01/03/2013 1603   VLDL 21 01/03/2013 1603   LDLCALC 74 01/03/2013 1603       Assessment and plan:   Alexandra Norris was seen today for follow-up and influenza.  Diagnoses and all orders for this visit:  Other fatigue -     Vitamin D, 25-hydroxy -     CBC with Differential   Allergic rhinitis, unspecified allergic rhinitis type -    Refill cetirizine (ZYRTEC) 10 MG tablet; Take 1 tablet (10 mg total) by mouth daily. For allergies Stable, meds refilled   Wheezing -   Refill albuterol (PROVENTIL HFA;VENTOLIN HFA) 108 (90 Base) MCG/ACT inhaler; Inhale 2 puffs into the lungs every 6 (six) hours as needed for wheezing. Stable, meds refilled  Return if symptoms worsen or fail to improve.       Lance Bosch, [place] [facility] [phone] 09/05/2015, 4:49 PM

## 2015-09-06 LAB — CBC WITH DIFFERENTIAL/PLATELET
Basophils Absolute: 0 10*3/uL (ref 0.0–0.1)
Basophils Relative: 0 % (ref 0–1)
Eosinophils Absolute: 0.3 10*3/uL (ref 0.0–0.7)
Eosinophils Relative: 3 % (ref 0–5)
HCT: 36 % (ref 36.0–46.0)
Hemoglobin: 11.4 g/dL — ABNORMAL LOW (ref 12.0–15.0)
Lymphocytes Relative: 28 % (ref 12–46)
Lymphs Abs: 2.5 10*3/uL (ref 0.7–4.0)
MCH: 25.9 pg — ABNORMAL LOW (ref 26.0–34.0)
MCHC: 31.7 g/dL (ref 30.0–36.0)
MCV: 81.6 fL (ref 78.0–100.0)
MPV: 9.9 fL (ref 8.6–12.4)
Monocytes Absolute: 0.5 10*3/uL (ref 0.1–1.0)
Monocytes Relative: 6 % (ref 3–12)
Neutro Abs: 5.7 10*3/uL (ref 1.7–7.7)
Neutrophils Relative %: 63 % (ref 43–77)
Platelets: 323 10*3/uL (ref 150–400)
RBC: 4.41 MIL/uL (ref 3.87–5.11)
RDW: 15.3 % (ref 11.5–15.5)
WBC: 9 10*3/uL (ref 4.0–10.5)

## 2015-09-06 LAB — VITAMIN D 25 HYDROXY (VIT D DEFICIENCY, FRACTURES): Vit D, 25-Hydroxy: 22 ng/mL — ABNORMAL LOW (ref 30–100)

## 2015-09-24 ENCOUNTER — Telehealth: Payer: Self-pay

## 2015-09-24 NOTE — Telephone Encounter (Signed)
Spoke with interpreter Lannette Donath [phone]. Interpreter verified name and DOB. Patient was given her results and instructions per Mateo Flow, verbalize understanding with no further questions.

## 2015-09-24 NOTE — Telephone Encounter (Signed)
-----   Message from Lance Bosch, NP sent at 09/19/2015  9:43 PM EDT ----- OTC vitamin D 1,000 IU to take once daily

## 2015-09-25 DIAGNOSIS — M25561 Pain in right knee: Secondary | ICD-10-CM | POA: Insufficient documentation

## 2015-09-25 DIAGNOSIS — G8929 Other chronic pain: Secondary | ICD-10-CM

## 2015-09-25 DIAGNOSIS — E559 Vitamin D deficiency, unspecified: Secondary | ICD-10-CM | POA: Insufficient documentation

## 2015-09-25 DIAGNOSIS — M25562 Pain in left knee: Secondary | ICD-10-CM

## 2015-09-25 HISTORY — DX: Pain in right knee: M25.561

## 2015-09-25 HISTORY — DX: Other chronic pain: G89.29

## 2015-10-21 ENCOUNTER — Ambulatory Visit: Payer: Self-pay | Attending: Family Medicine | Admitting: Family Medicine

## 2015-10-21 ENCOUNTER — Encounter: Payer: Self-pay | Admitting: Family Medicine

## 2015-10-21 VITALS — BP 106/70 | HR 75 | Temp 98.2°F | Resp 16 | Ht 61.0 in | Wt 185.0 lb

## 2015-10-21 DIAGNOSIS — J4520 Mild intermittent asthma, uncomplicated: Secondary | ICD-10-CM | POA: Insufficient documentation

## 2015-10-21 DIAGNOSIS — E669 Obesity, unspecified: Secondary | ICD-10-CM | POA: Insufficient documentation

## 2015-10-21 DIAGNOSIS — E119 Type 2 diabetes mellitus without complications: Secondary | ICD-10-CM | POA: Insufficient documentation

## 2015-10-21 DIAGNOSIS — Z79899 Other long term (current) drug therapy: Secondary | ICD-10-CM | POA: Insufficient documentation

## 2015-10-21 DIAGNOSIS — N644 Mastodynia: Secondary | ICD-10-CM | POA: Insufficient documentation

## 2015-10-21 DIAGNOSIS — R7303 Prediabetes: Secondary | ICD-10-CM | POA: Insufficient documentation

## 2015-10-21 LAB — HEMOGLOBIN A1C
HEMOGLOBIN A1C: 5.7 % — AB (ref <5.7)
Mean Plasma Glucose: 117 mg/dL

## 2015-10-21 NOTE — Progress Notes (Signed)
Patient's here to re-establish care and med refill.  Patient c/o pain on L side of her chest. Feels like some bumps on her chest x 4wks, but the last week it has gotten worse. Pain rated 5/10 off and on.   Patient states that the pain is not internal, but external w/o any tightness associated.

## 2015-10-21 NOTE — Progress Notes (Signed)
Subjective:  Patient ID: Alexandra Norris, female    DOB: [birth year]  Age: 40 y.o. MRN: [id number]  CC: Establish Care and Medication Refill   HPI Alexandra Norris is a 40 year old female with a history of asthma, pre- diabetes previously followed by Chari Manning FNP who comes into the clinic complaining of a 4 week history of left chest pain but then points to the upper part of her left breast for the last 4 weeks and pain has worsened in the last 1 week especially when she takes a shower. She denies the presence of any lumps. She has a history of oral contraceptive use as well as injectable use.  Her Asthma is well controlled and she is not needing any refills today.  Outpatient Prescriptions Prior to Visit  Medication Sig Dispense Refill  . albuterol (PROVENTIL HFA;VENTOLIN HFA) 108 (90 Base) MCG/ACT inhaler Inhale 2 puffs into the lungs every 6 (six) hours as needed for wheezing. 1 Inhaler 3  . cetirizine (ZYRTEC) 10 MG tablet Take 1 tablet (10 mg total) by mouth daily. For allergies 30 tablet 11  . diclofenac (VOLTAREN) 50 MG EC tablet Take 1 tablet (50 mg total) by mouth 3 (three) times daily. For 5 days, then as needed. 30 tablet 0  . ibuprofen (ADVIL,MOTRIN) 600 MG tablet Take 1 tablet (600 mg total) by mouth 2 (two) times daily as needed. Take twice daily for at least 5-7 days 30 tablet 1  . fluticasone (FLONASE) 50 MCG/ACT nasal spray Place 2 sprays into both nostrils daily. Allergies (Patient not taking: Reported on 01/11/2015) 16 g 6  . risperiDONE (RISPERDAL) 2 MG tablet Take 1 tablet (2 mg total) by mouth at bedtime. (Patient not taking: Reported on 01/11/2015) 30 tablet 0  . mirtazapine (REMERON) 15 MG tablet Take 1 tablet (15 mg total) by mouth at bedtime. (Patient not taking: Reported on 09/27/2014) 30 tablet 0  . oseltamivir (TAMIFLU) 75 MG capsule Take 1 capsule (75 mg total) by mouth every 12 (twelve) hours. (Patient not taking: Reported on 09/05/2015) 10 capsule 0  .  predniSONE (DELTASONE) 20 MG tablet Take 1 tablet (20 mg total) by mouth daily with breakfast. (Patient not taking: Reported on 09/05/2015) 5 tablet 0   No facility-administered medications prior to visit.    ROS Review of Systems  Constitutional: Negative for activity change and appetite change.  HENT: Negative for sinus pressure and sore throat.   Respiratory: Negative for chest tightness, shortness of breath and wheezing.   Cardiovascular: Negative for chest pain and palpitations.  Gastrointestinal: Negative for abdominal pain, constipation and abdominal distention.  Genitourinary: Negative.   Musculoskeletal: Negative.   Psychiatric/Behavioral: Negative for behavioral problems and dysphoric mood.    Objective:  BP 106/70 mmHg  Pulse 75  Temp(Src) 98.2 F (36.8 C) (Oral)  Resp 16  Ht 5\' 1"  (1.549 m)  Wt 185 lb (83.915 kg)  BMI 34.97 kg/m2  SpO2 100%  LMP 09/24/2015  BP/Weight 10/21/2015 [id number]  Systolic BP A999333 98 A999333  Diastolic BP 70 64 71  Wt. (Lbs) 185 188.2 -  BMI 34.97 35.58 -      Physical Exam  Constitutional: She is oriented to person, place, and time. She appears well-developed and well-nourished.  Cardiovascular: Normal rate, normal heart sounds and intact distal pulses.   No murmur heard. Pulmonary/Chest: Effort normal and breath sounds normal. She has no wheezes. She has no rales. She exhibits no tenderness.  Abdominal: Soft. Bowel sounds are normal. She exhibits  no distension and no mass. There is no tenderness.  Genitourinary:  Breast - normal appearance of breasts bilaterally Left breast tenderness at 11 o'clock Right breast is not tender  Musculoskeletal: Normal range of motion.  Neurological: She is alert and oriented to person, place, and time.     Assessment & Plan:   1. OBESITY Discussed weight loss options, reducing portion sizes and exercise  2. Asthma, mild intermittent, uncomplicated Controlled Continue Proventil MDI as  needed  3. Pre-diabetes Last A1c was 5.8 a year ago. - Hemoglobin A1c  4. Mastodynia, female - MM DIAG BREAST TOMO BILATERAL; Future - US BREAST LTD UNI LEFT INC AXILLA; Future   No orders of the defined types were placed in this encounter.    Follow-up:  1 month  Arnoldo Morale MD

## 2015-10-25 ENCOUNTER — Telehealth: Payer: Self-pay

## 2015-10-25 NOTE — Telephone Encounter (Signed)
Spoke with [employer] [phone]. Interpreter left message for the patient to return my call.

## 2015-10-25 NOTE — Telephone Encounter (Signed)
-----   Message from Arnoldo Morale, MD sent at 10/22/2015 12:15 PM EDT ----- Her blood work reveals she is still prediabetic. Continue working on weight loss and a healthy lifestyle.

## 2015-10-28 NOTE — Telephone Encounter (Signed)
Place call to [facility] interpreter [place] [phone]. Interpreter verified name and DOB. Patient was given lab results, verbalize understanding with no further questions.

## 2015-10-29 ENCOUNTER — Ambulatory Visit: Payer: Self-pay

## 2015-10-29 ENCOUNTER — Encounter (HOSPITAL_COMMUNITY): Payer: Self-pay | Admitting: *Deleted

## 2015-10-29 ENCOUNTER — Other Ambulatory Visit (HOSPITAL_COMMUNITY): Payer: Self-pay | Admitting: *Deleted

## 2015-10-29 DIAGNOSIS — N6320 Unspecified lump in the left breast, unspecified quadrant: Secondary | ICD-10-CM

## 2015-10-29 DIAGNOSIS — N644 Mastodynia: Secondary | ICD-10-CM

## 2015-11-14 ENCOUNTER — Ambulatory Visit
Admission: RE | Admit: 2015-11-14 | Discharge: 2015-11-14 | Disposition: A | Discharge status: Home / Self Care | Payer: No Typology Code available for payment source | Source: Ambulatory Visit | Attending: Obstetrics and Gynecology | Admitting: Obstetrics and Gynecology

## 2015-11-14 ENCOUNTER — Ambulatory Visit (HOSPITAL_COMMUNITY)
Admission: RE | Admit: 2015-11-14 | Discharge: 2015-11-14 | Disposition: A | Discharge status: Home / Self Care | Payer: Self-pay | Source: Ambulatory Visit | Attending: Obstetrics and Gynecology | Admitting: Obstetrics and Gynecology

## 2015-11-14 ENCOUNTER — Encounter (HOSPITAL_COMMUNITY): Payer: Self-pay

## 2015-11-14 VITALS — BP 114/70 | Temp 98.6°F | Ht 61.0 in | Wt 183.2 lb

## 2015-11-14 DIAGNOSIS — N6320 Unspecified lump in the left breast, unspecified quadrant: Secondary | ICD-10-CM

## 2015-11-14 DIAGNOSIS — N6322 Unspecified lump in the left breast, upper inner quadrant: Secondary | ICD-10-CM

## 2015-11-14 DIAGNOSIS — N644 Mastodynia: Secondary | ICD-10-CM

## 2015-11-14 DIAGNOSIS — Z1239 Encounter for other screening for malignant neoplasm of breast: Secondary | ICD-10-CM

## 2015-11-14 IMAGING — MG 2D DIGITAL DIAGNOSTIC BILATERAL MAMMOGRAM WITH CAD AND ADJUNCT T
8 of 14 series · 8 of 34 positions shown · non-contrast
Comparison: None

CLINICAL DATA: 40-year-old patient with focal area of pain in the
upper inner left breast. No known family history of breast cancer.
She does not palpate a lump. This is the patient's baseline exam.

EXAM:
2D DIGITAL DIAGNOSTIC BILATERAL MAMMOGRAM WITH CAD AND ADJUNCT TOMO
ULTRASOUND LEFT BREAST

[R MLO synth-2D]
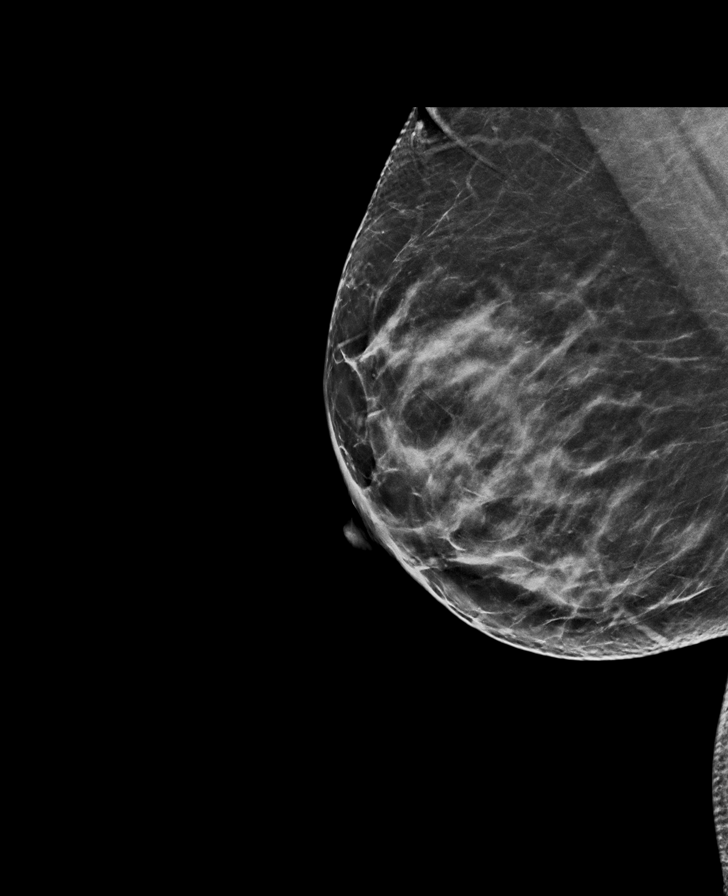

[L CC]
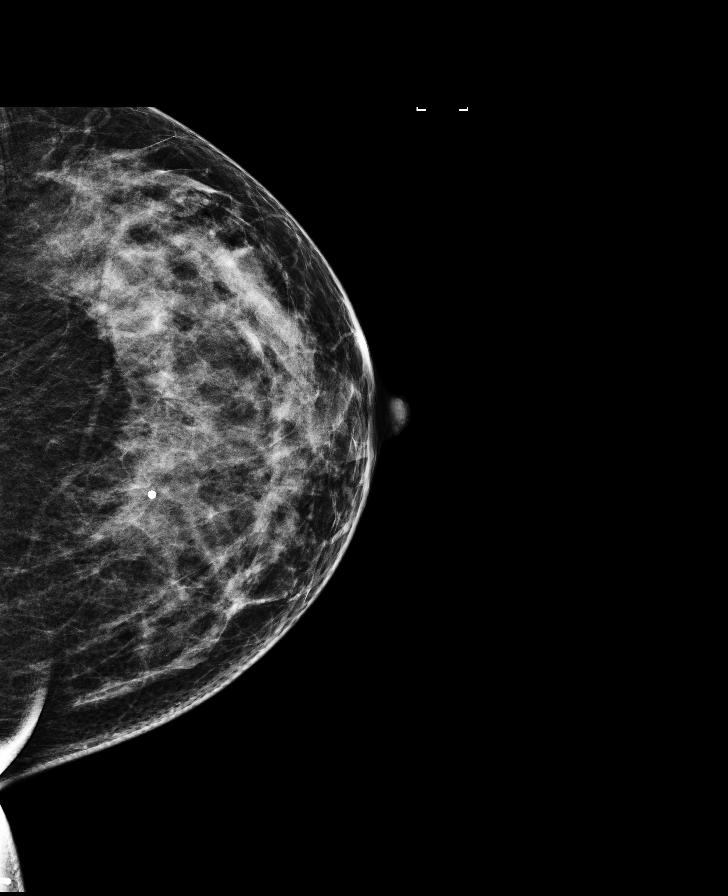

[L CC synth-2D]
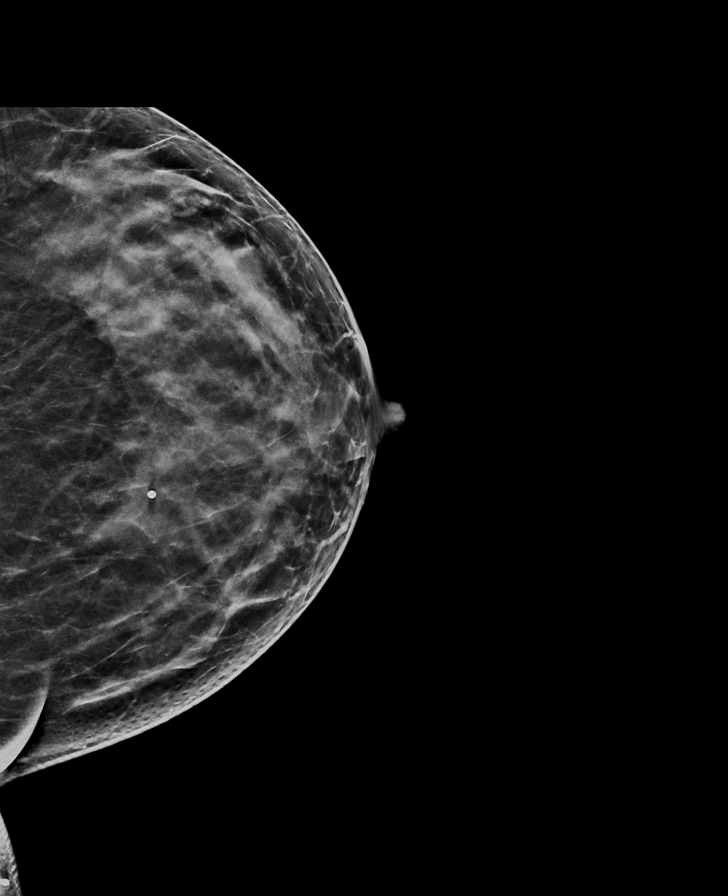

[L TAN]
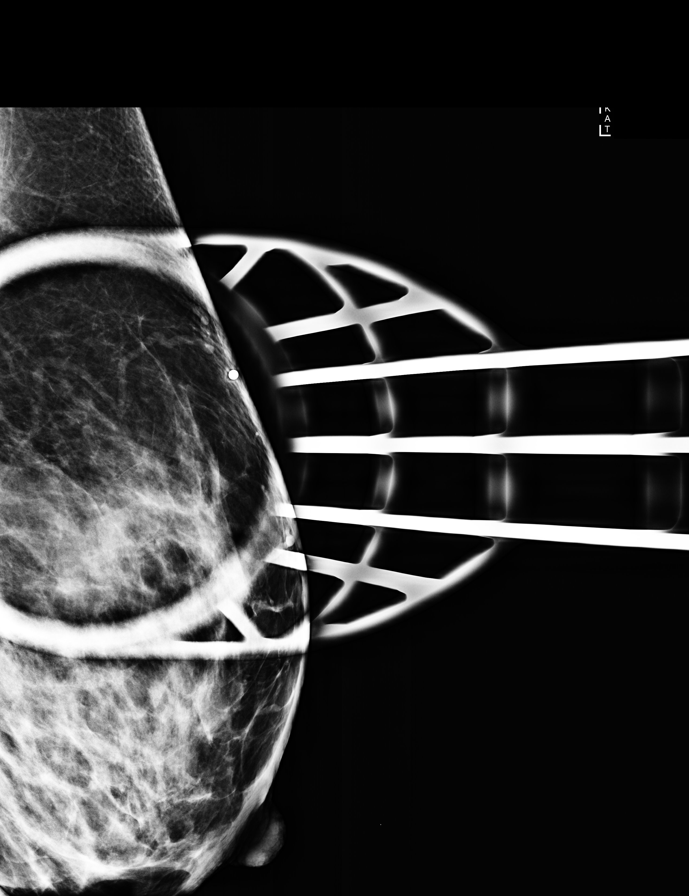

[R CC synth-2D]
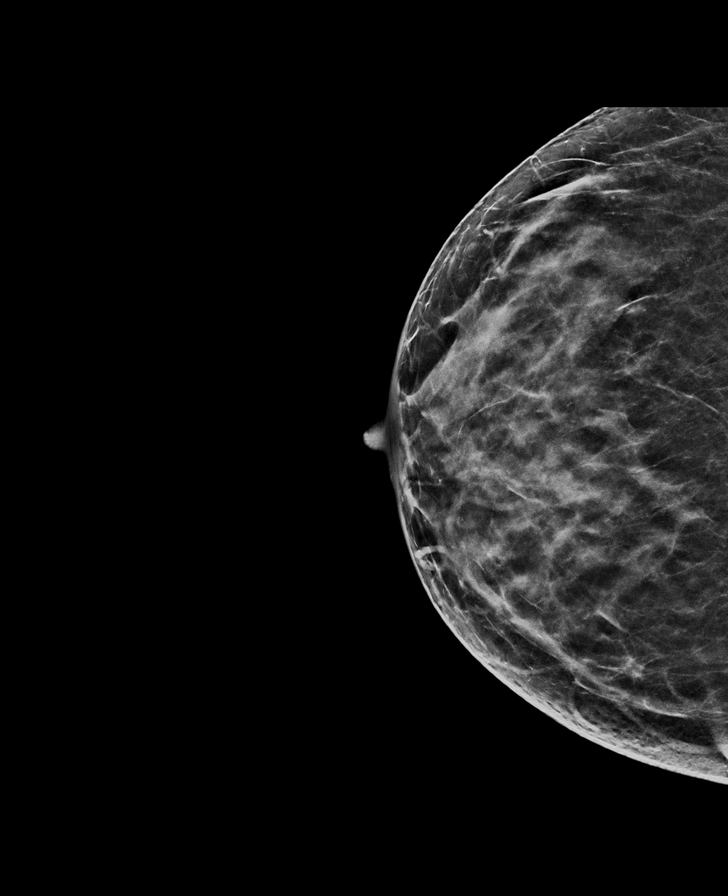

[R CC]
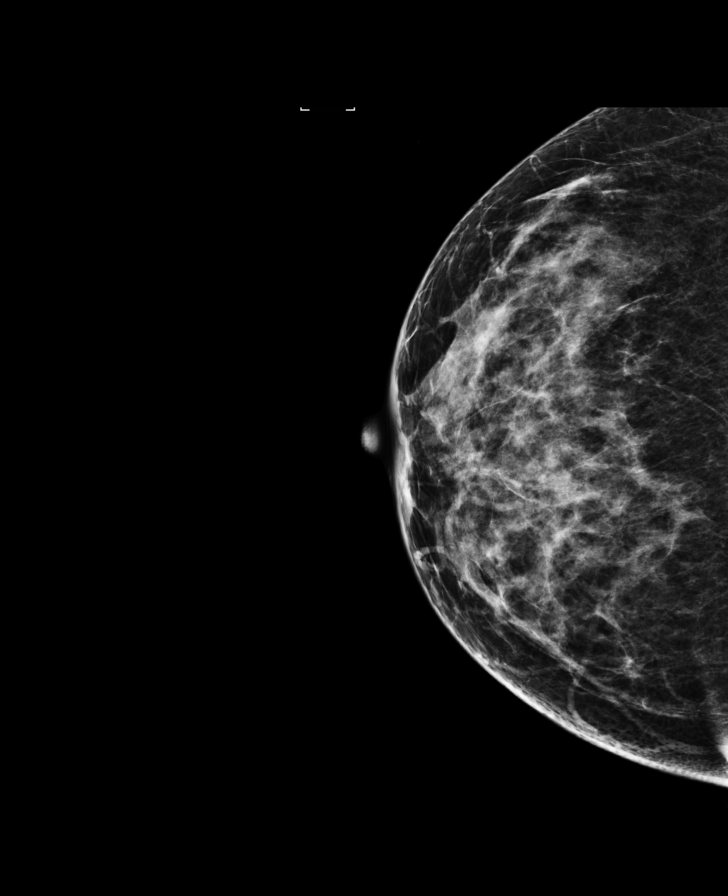

[R MLO]
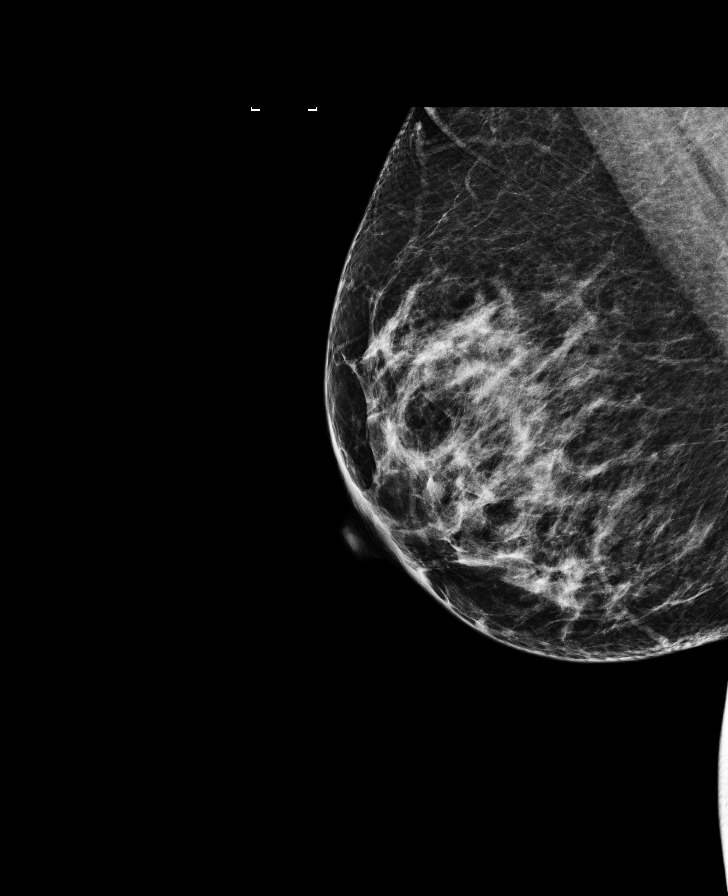

[L MLO]
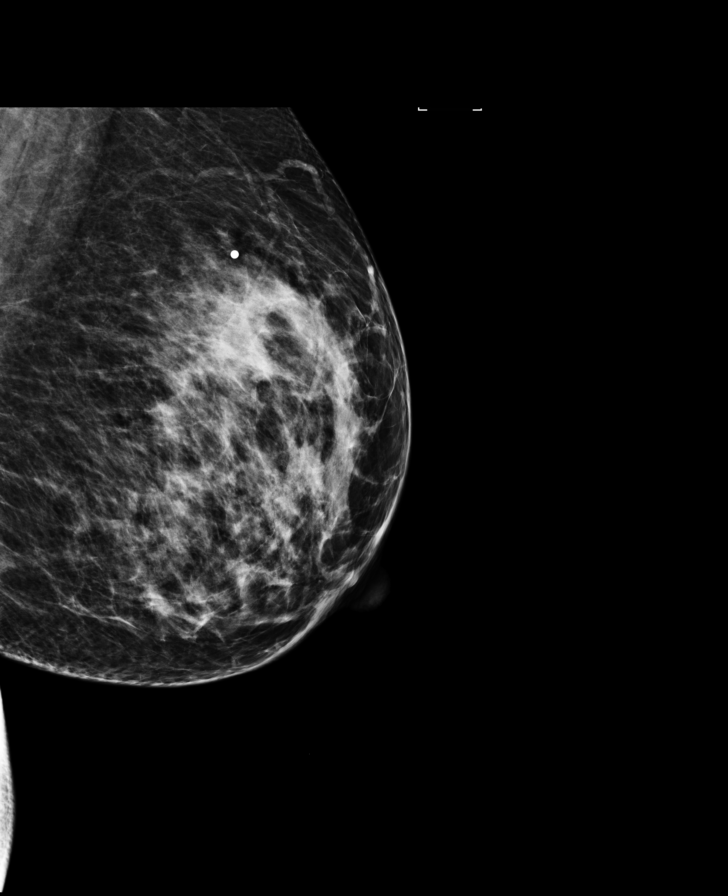

[8 of 34 positions shown; findings below may reference images not displayed]

ACR Breast Density Category c: The breast tissue is heterogeneously
dense, which may obscure small masses.
FINDINGS: A metallic skin marker was placed in the region of recent
intermittent pain in the upper inner left breast. Spot tomographic
images show no mass, distortion, suspicious microcalcification, or
skin thickening in this region.

Whole breast knee views bilaterally show no evidence of mass,
distortion, or suspicious microcalcification in either breast.

Mammographic images were processed with CAD.

On physical exam, I do not palpate a mass in the upper inner
quadrant of the left breast.

Targeted ultrasound is performed, showing normal fibroglandular
tissue. No solid or cystic mass or abnormal shadowing is identified.
IMPRESSION: No evidence of malignancy in either breast.

RECOMMENDATION:
Screening mammogram in one year.(Code:[G3])

I have discussed the findings and recommendations with the patient.
Results were also provided in writing at the conclusion of the
visit. If applicable, a reminder letter will be sent to the patient
regarding the next appointment.

BI-RADS CATEGORY  1: Negative.

## 2015-11-14 NOTE — Progress Notes (Addendum)
No complaints today.   Pap Smear:  Pap smear not completed today. Last Pap smear was 01/03/2013 at [facility] and normal with yeast. Per patient has no history of an abnormal Pap smear. Last Pap smear result is in EPIC.  Physical exam: Breasts Breasts symmetrical. No skin abnormalities bilateral breasts. No nipple retraction bilateral breasts. No nipple discharge bilateral breasts. No lymphadenopathy. No lumps palpated right breast. Palpated a pea sized lump within the left breast at 11 o'clock 11 cm from the nipple. Complaints of left upper inner breast pain on exam. Referred patient to the [facility] for diagnostic mammogram. Appointment scheduled for Thursday, November 14, 2015 at 0930.   Pelvic/Bimanual No Pap smear completed today since last Pap smear and HPV typing was completed 01/03/2013. Pap smear not indicated per BCCCP guidelines.   Smoking History: Patient has never smoked.  Patient Navigation: Patient education provided. Access to services provided for patient through [facility] program. Spanish interpreter provided.  Used Spanish interpreter [employer] from [facility]

## 2015-11-14 NOTE — Patient Instructions (Signed)
Educational materials on self breast awareness given. Explained to Alexandra Norris that she did not need a Pap smear today due to last Pap smear was 01/03/2013. Let her know BCCCP will cover Pap smears and HPV typing every 5 years unless has a history of abnormal Pap smears. Referred patient to the [facility] for diagnostic mammogram. Appointment scheduled for Thursday, November 14, 2015 at 0930. Alexandra Norris verbalized understanding.  Camille Dragan, Arvil Chaco, RN 10:22 AM

## 2015-11-19 ENCOUNTER — Encounter (HOSPITAL_COMMUNITY): Payer: Self-pay | Admitting: *Deleted

## 2015-11-20 ENCOUNTER — Ambulatory Visit: Payer: Self-pay | Attending: Family Medicine | Admitting: Physician Assistant

## 2015-11-20 ENCOUNTER — Encounter: Payer: Self-pay | Admitting: Physician Assistant

## 2015-11-20 VITALS — BP 121/74 | HR 70 | Temp 98.6°F | Resp 18 | Ht 61.0 in | Wt 187.4 lb

## 2015-11-20 DIAGNOSIS — L989 Disorder of the skin and subcutaneous tissue, unspecified: Secondary | ICD-10-CM

## 2015-11-20 DIAGNOSIS — J351 Hypertrophy of tonsils: Secondary | ICD-10-CM

## 2015-11-20 DIAGNOSIS — J029 Acute pharyngitis, unspecified: Secondary | ICD-10-CM

## 2015-11-20 DIAGNOSIS — Z79899 Other long term (current) drug therapy: Secondary | ICD-10-CM | POA: Insufficient documentation

## 2015-11-20 MED ORDER — BENZONATATE 200 MG PO CAPS: 200.0000 mg | ORAL_CAPSULE | Freq: Two times a day (BID) | ORAL | Status: DC | PRN

## 2015-11-20 MED ORDER — AMOXICILLIN 500 MG PO CAPS: 500.0000 mg | ORAL_CAPSULE | Freq: Three times a day (TID) | ORAL | Status: DC

## 2015-11-20 NOTE — Progress Notes (Signed)
Chief Complaint: "my throat hurts again"  Subjective: This is a 40 year old female with history of asthma who is presenting with a sore throat. Her symptoms have been going on for one week. She states it is painful to swallow. Sometimes she notes white spots on the back of her throat. Her tonsils have been large and red. This is the third time this year she's had an issue with her throat.   She also states she has a painful spot on her nose that looks like it is growing and becoming more sore. Sometimes when she scratches it it bleeds.   ROS:  GEN: denies fever or chills, denies change in weight Skin: +lesions no rashes HEENT: denies headache, earache, epistaxis, +sore throat, no  neck pain LUNGS: denies SHOB, dyspnea, PND, orthopnea CV: denies CP or palpitations  Objective:  Filed Vitals:   11/20/15 1143  BP: 121/74  Pulse: 70  Temp: 98.6 F (37 C)  TempSrc: Oral  Resp: 18  Height: 5\' 1"  (1.549 m)  Weight: 187 lb 6.4 oz (85.004 kg)  SpO2: 99%    Physical Exam:  General: in no acute distress. Skin-flesh colored molar look with scab just to the right of the tip of her nose HEENT: no pallor, no icterus, moist oral mucosa, but red and inflamed no exudates today. No JVD, no lymphadenopathy Heart: Normal  s1 &s2  Regular rate and rhythm, without murmurs, rubs, gallops. Lungs: Clear to auscultation bilaterally.   Medications: Prior to Admission medications   Medication Sig Start Date End Date Taking? Authorizing Provider  albuterol (PROVENTIL HFA;VENTOLIN HFA) 108 (90 Base) MCG/ACT inhaler Inhale 2 puffs into the lungs every 6 (six) hours as needed for wheezing. 09/05/15   Lance Bosch, NP  amoxicillin (AMOXIL) 500 MG capsule Take 1 capsule (500 mg total) by mouth 3 (three) times daily. 11/20/15   Stefannie Defeo Daneil Dan, PA-C  benzonatate (TESSALON) 200 MG capsule Take 1 capsule (200 mg total) by mouth 2 (two) times daily as needed for cough. 11/20/15   Kelvis Berger Daneil Dan, PA-C  cetirizine  (ZYRTEC) 10 MG tablet Take 1 tablet (10 mg total) by mouth daily. For allergies 09/05/15   Lance Bosch, NP  diclofenac (VOLTAREN) 50 MG EC tablet Take 1 tablet (50 mg total) by mouth 3 (three) times daily. For 5 days, then as needed. Patient not taking: Reported on 11/14/2015 07/07/15   Melony Overly, MD  fluticasone [facility]) 50 MCG/ACT nasal spray Place 2 sprays into both nostrils daily. Allergies Patient not taking: Reported on 01/11/2015 09/27/14   Lance Bosch, NP  ibuprofen (ADVIL,MOTRIN) 600 MG tablet Take 1 tablet (600 mg total) by mouth 2 (two) times daily as needed. Take twice daily for at least 5-7 days Patient not taking: Reported on 11/14/2015 01/11/15   Lance Bosch, NP  risperiDONE (RISPERDAL) 2 MG tablet Take 1 tablet (2 mg total) by mouth at bedtime. Patient not taking: Reported on 01/11/2015 07/09/14   Niel Hummer, NP  Vitamin D, Ergocalciferol, (DRISDOL) 50000 units CAPS capsule Take 50,000 Units by mouth every 7 (seven) days.    Historical Provider, MD    Assessment: 1. Pharyngitis, likely strep 2. Abn skin lesion of face    Plan: Amoxil 500 mg TID Tessalon perles ENT referral Derm referral  Follow up:as needed  The patient was given clear instructions to go to ER or return to medical center if symptoms don't improve, worsen or new problems develop. The patient verbalized understanding. The  patient was told to call to get lab results if they haven't heard anything in the next week.   This note has been created with Surveyor, quantity. Any transcriptional errors are unintentional.   Zettie Pho, PA-C 11/20/2015, 11:53 AM

## 2015-11-20 NOTE — Progress Notes (Signed)
Patient is here for throat pain  Patient complains of boils with white puss being present on the sides of her throat. Pain is described as aching and throbbing. Pain is scaled currently at a 6.  Patient has taken medication and patient has eaten today.

## 2015-11-22 ENCOUNTER — Ambulatory Visit: Payer: Self-pay

## 2015-11-26 NOTE — Addendum Note (Signed)
Encounter addended by: Loletta Parish, RN on: 11/26/2015 11:27 AM<BR>     Documentation filed: Notes Section

## 2015-12-05 ENCOUNTER — Ambulatory Visit (HOSPITAL_COMMUNITY): Payer: Self-pay

## 2015-12-16 ENCOUNTER — Encounter (HOSPITAL_COMMUNITY): Payer: Self-pay

## 2015-12-16 ENCOUNTER — Emergency Department (HOSPITAL_COMMUNITY)
Admission: EM | Admit: 2015-12-16 | Discharge: 2015-12-17 | Disposition: A | Discharge status: Home / Self Care | Payer: Self-pay | Attending: Emergency Medicine | Admitting: Emergency Medicine

## 2015-12-16 DIAGNOSIS — J45909 Unspecified asthma, uncomplicated: Secondary | ICD-10-CM | POA: Insufficient documentation

## 2015-12-16 DIAGNOSIS — Z79899 Other long term (current) drug therapy: Secondary | ICD-10-CM | POA: Insufficient documentation

## 2015-12-16 DIAGNOSIS — Z7951 Long term (current) use of inhaled steroids: Secondary | ICD-10-CM | POA: Insufficient documentation

## 2015-12-16 DIAGNOSIS — N390 Urinary tract infection, site not specified: Secondary | ICD-10-CM | POA: Insufficient documentation

## 2015-12-16 LAB — I-STAT BETA HCG BLOOD, ED (MC, WL, AP ONLY): I-stat hCG, quantitative: <5 m[IU]/mL (ref <5)

## 2015-12-16 LAB — URINE MICROSCOPIC-ADD ON
Bacteria, UA: NONE SEEN
RBC / HPF: NONE SEEN RBC/hpf (ref 0–5)
Squamous Epithelial / LPF: NONE SEEN

## 2015-12-16 LAB — URINALYSIS, ROUTINE W REFLEX MICROSCOPIC
Bilirubin Urine: NEGATIVE
Glucose, UA: NEGATIVE mg/dL
KETONES UR: NEGATIVE mg/dL
Nitrite: NEGATIVE
PROTEIN: NEGATIVE mg/dL
Specific Gravity, Urine: 1.004 — ABNORMAL LOW (ref 1.005–1.030)
pH: 7 (ref 5.0–8.0)

## 2015-12-16 NOTE — ED Notes (Signed)
Pt used restroom and didn't get urine sample.

## 2015-12-16 NOTE — ED Notes (Signed)
Pt complains of hematuria since Saturday

## 2015-12-17 MED ORDER — PHENAZOPYRIDINE HCL 200 MG PO TABS
200.0000 mg | ORAL_TABLET | Freq: Three times a day (TID) | ORAL | Status: DC
Start: 2015-12-17 — End: 2016-06-02

## 2015-12-17 MED ORDER — CEPHALEXIN 500 MG PO CAPS: 500.0000 mg | ORAL_CAPSULE | Freq: Two times a day (BID) | ORAL | Status: DC

## 2015-12-17 MED ORDER — CEPHALEXIN 250 MG PO CAPS
250.0000 mg | ORAL_CAPSULE | Freq: Once | ORAL | Status: AC
  Administered 2015-12-17: 250 mg via ORAL
  Filled 2015-12-17: qty 1

## 2015-12-17 MED ORDER — TRAMADOL HCL 50 MG PO TABS
50.0000 mg | ORAL_TABLET | Freq: Once | ORAL | Status: AC
  Administered 2015-12-17: 50 mg via ORAL
  Filled 2015-12-17: qty 1

## 2015-12-17 MED ORDER — CEPHALEXIN 500 MG PO CAPS: 500.0000 mg | ORAL_CAPSULE | Freq: Four times a day (QID) | ORAL | Status: DC

## 2015-12-17 MED ORDER — PHENAZOPYRIDINE HCL 200 MG PO TABS: 200.0000 mg | ORAL_TABLET | Freq: Three times a day (TID) | ORAL | Status: DC

## 2015-12-17 MED ORDER — ONDANSETRON 4 MG PO TBDP
4.0000 mg | ORAL_TABLET | Freq: Once | ORAL | Status: AC
  Administered 2015-12-17: 4 mg via ORAL
  Filled 2015-12-17: qty 1

## 2015-12-17 NOTE — ED Provider Notes (Signed)
CSN: [id number]     Arrival date & time 12/16/15  2144 History  By signing my name below, I, Evelene Croon, attest that this documentation has been prepared under the direction and in the presence of non-physician practitioner, Linus Mako, PA-C. Electronically Signed: Evelene Croon, Scribe. 12/17/2015. 1:58 AM.    Chief Complaint  Patient presents with  . Hematuria  . Dysuria    The history is provided by the patient. No language interpreter was used.    HPI Comments:  Alexandra Norris is a 40 y.o. female who presents to the Emergency Department complaining of dysuria with associated hematuria with suprapubic cramping and low back cramping for the past 3 days. She Noticed some mild hematuria earlier in the morning but has since cleared up and she is no longer having dark urine. She denies having history of hematuria. She denies vomiting or diarrhea. Is not had any weakness, abdominal pain, fevers No alleviating factors noted. Patient believes that she has a UTI. She denies having any rectal pain or bleeding. Denies having any vaginal discharge or vaginal bleeding.  Past Medical History  Diagnosis Date  . History of bronchitis     "I've stayed in hospital 2 X w/bronchitis"  . Asthma   . History of blood transfusion 1999    w/vaginal delivery   Past Surgical History  Procedure Laterality Date  . Cholecystectomy  2011  . Cesarean section  1997; 2008  . Tubal ligation  04/2007   Family History  Problem Relation Age of Onset  . Diabetes Mother   . Osteoarthritis Mother   . Hypertension Mother    Social History  Substance Use Topics  . Smoking status: Never Smoker   . Smokeless tobacco: Never Used  . Alcohol Use: No   OB History    Gravida Para Term Preterm AB TAB SAB Ectopic Multiple Living   3 3 3       3      Review of Systems  Constitutional: Negative for fever.  Gastrointestinal: Positive for abdominal pain.  Genitourinary: Positive for dysuria and hematuria.   All other systems reviewed and are negative.  Allergies  Review of patient's allergies indicates no known allergies.  Home Medications   Prior to Admission medications   Medication Sig Start Date End Date Taking? Authorizing Provider  albuterol (PROVENTIL HFA;VENTOLIN HFA) 108 (90 Base) MCG/ACT inhaler Inhale 2 puffs into the lungs every 6 (six) hours as needed for wheezing. 09/05/15   Lance Bosch, NP  amoxicillin (AMOXIL) 500 MG capsule Take 1 capsule (500 mg total) by mouth 3 (three) times daily. 11/20/15   Yareni Creps Daneil Dan, PA-C  benzonatate (TESSALON) 200 MG capsule Take 1 capsule (200 mg total) by mouth 2 (two) times daily as needed for cough. 11/20/15   Shalee Paolo Daneil Dan, PA-C  cephALEXin (KEFLEX) 500 MG capsule Take 1 capsule (500 mg total) by mouth 2 (two) times daily. 12/17/15   Darald Uzzle Carlota Raspberry, PA-C  cetirizine (ZYRTEC) 10 MG tablet Take 1 tablet (10 mg total) by mouth daily. For allergies 09/05/15   Lance Bosch, NP  diclofenac (VOLTAREN) 50 MG EC tablet Take 1 tablet (50 mg total) by mouth 3 (three) times daily. For 5 days, then as needed. Patient not taking: Reported on 11/14/2015 07/07/15   Melony Overly, MD  fluticasone [facility]) 50 MCG/ACT nasal spray Place 2 sprays into both nostrils daily. Allergies Patient not taking: Reported on 01/11/2015 09/27/14   Lance Bosch, NP  ibuprofen (ADVIL,MOTRIN)  600 MG tablet Take 1 tablet (600 mg total) by mouth 2 (two) times daily as needed. Take twice daily for at least 5-7 days Patient not taking: Reported on 11/14/2015 01/11/15   Lance Bosch, NP  phenazopyridine (PYRIDIUM) 200 MG tablet Take 1 tablet (200 mg total) by mouth 3 (three) times daily. 12/17/15   Maame Dack Carlota Raspberry, PA-C  risperiDONE (RISPERDAL) 2 MG tablet Take 1 tablet (2 mg total) by mouth at bedtime. Patient not taking: Reported on 01/11/2015 07/09/14   Niel Hummer, NP  Vitamin D, Ergocalciferol, (DRISDOL) 50000 units CAPS capsule Take 50,000 Units by mouth every 7 (seven) days.     Historical Provider, MD   BP 114/75 mmHg  Pulse 73  Temp(Src) 98.7 F (37.1 C) (Oral)  Resp 20  Ht 5\' 1"  (1.549 m)  Wt 83.462 kg  BMI 34.78 kg/m2  SpO2 100%  LMP 11/23/2015 Physical Exam  Constitutional: She is oriented to person, place, and time. She appears well-developed and well-nourished. No distress.  HENT:  Head: Normocephalic and atraumatic.  Eyes: Conjunctivae are normal.  Cardiovascular: Normal rate.   Pulmonary/Chest: Effort normal.  Abdominal: She exhibits no distension. There is tenderness (mild) in the suprapubic area. There is no rigidity, no rebound, no guarding and no CVA tenderness.  Neurological: She is alert and oriented to person, place, and time.  Skin: Skin is warm and dry.  Psychiatric: She has a normal mood and affect.  Nursing note and vitals reviewed.   ED Course  Procedures DIAGNOSTIC STUDIES:  Oxygen Saturation is 100% on RA, nromal by my interpretation.    COORDINATION OF CARE:  1:56 AM Will discharge with Rx for antibiotic and pain meds. Discussed treatment plan with pt at bedside and pt agreed to plan.  Labs Review Labs Reviewed  URINALYSIS, ROUTINE W REFLEX MICROSCOPIC (NOT AT [facility]) - Abnormal; Notable for the following:    Specific Gravity, Urine 1.004 (*)    Hgb urine dipstick LARGE (*)    Leukocytes, UA MODERATE (*)    All other components within normal limits  URINE CULTURE  URINE MICROSCOPIC-ADD ON  I-STAT BETA HCG BLOOD, ED ([facility], [facility], [facility] ONLY)    Imaging Review No results found. I have personally reviewed and evaluated these images and lab results as part of my medical decision-making.   EKG Interpretation None      MDM   Final diagnoses:  UTI (lower urinary tract infection)    Pt diagnosed with a UTI. Pt is afebrile, tachycardia, hypotension, or other signs of serious infection.  Pt to be dc home with antibiotics and instructions to follow up with PCP if symptoms persist. Discussed return precautions. Pt appears safe  for discharge.  Rx;  Keflex and pyridium, urine culture sent out. PCP follow-up with Arnoldo Morale, MD recommended.  Blood pressure 114/75, pulse 73, temperature 98.7 F (37.1 C), temperature source Oral, resp. rate 20, height 5\' 1"  (1.549 m), weight 83.462 kg, last menstrual period 11/23/2015, SpO2 100 %.   Delos Haring, PA-C [id number]  Delora Fuel, MD [id number]

## 2015-12-17 NOTE — Discharge Instructions (Signed)
Hematuria - Adultos (Hematuria, Adult) La hematuria es la presencia de sangre en la orina. La causa puede ser una infeccin en la vejiga, en los riones, en la prstata, clculos renales o cncer de las vas urinarias. Normalmente las infecciones pueden tratarse con medicamentos, y los clculos renales, por lo general, se eliminarn a travs de la orina. Si ninguna de estas es la causa de su hematuria, quizs se necesiten ms estudios para Building services engineer. Es muy importante que le informe a su mdico si ve sangre en la [place], aunque el sangrado se detenga sin tratamiento o no cause dolor. La sangre que aparece en la orina y luego se detiene y vuelve a aparecer nuevamente puede ser un sntoma de una enfermedad muy grave. Adems, el dolor no es un sntoma en las [place]apas iniciales de muchos tipos de cncer urinarios. INSTRUCCIONES PARA EL CUIDADO EN EL HOGAR   Beba mucho lquido, entre 3 a Risk analyst. Si le han diagnosticado una infeccin, se recomienda especialmente el jugo de arndanos rojos, [address] de grandes cantidades de [place].  Evite la cafena, el t y las bebidas con gas porque suelen irritar la vejiga.  Evite el alcohol ya que puede Teacher, adult education.  Tome todos los [employer] se lo haya indicado el mdico.  Si le recetaron antibiticos, asegrese de terminarlos, incluso si comienza a sentirse mejor.  Si le han diagnosticado clculos renales, siga las instrucciones de su mdico con respecto a Ecologist la orina para TEFL teacher clculo.  Vace la vejiga con frecuencia. Evite retener la orina durante largos perodos.  Despus de defecar, las mujeres deben higienizarse desde adelante hacia atrs. Use solo un papel por vez.  Si es [employer], vace la vejiga antes y despus de Management consultant. SOLICITE ATENCIN MDICA SI:  Siente dolor en la espalda.  Tiene fiebre.  Tiene sensacin de Programme researcher, broadcasting/film/video (nuseas) o vmitos.  Los sntomas no mejoran despus  de [address]. Si la condicin empeora, visite al mdico antes de lo previsto. SOLICITE ATENCIN MDICA DE INMEDIATO SI:   Vomita con frecuencia e intensamente y no puede tolerar los medicamentos.  Siente un dolor intenso en la espalda o el abdomen incluso tomando los medicamentos.  Elimina cogulos grandes o sangre con la [place].  Se siente extremadamente dbil, se desmaya o pierde el conocimiento. ASEGRESE DE QUE:   Comprende estas instrucciones.  Controlar su afeccin.  Recibir ayuda de inmediato si no mejora o si empeora.   Esta informacin no tiene Theme park manager el consejo del mdico. Asegrese de hacerle al mdico cualquier pregunta que tenga.   Document Released: 05/25/2005 Document Revised: 06/15/2014 Elsevier Interactive Patient Education 2016 Elsevier Inc. Infeccin urinaria  (Urinary Tract Infection)  La infeccin urinaria puede ocurrir en cualquier lugar del tracto urinario. El tracto urinario es un sistema de drenaje del cuerpo por el que se eliminan los desechos y el exceso de [place]. El tracto urinario est formado por dos riones, dos urteres, la vejiga y Engineer, mining. Los riones son rganos que tienen forma de frijol. Cada rin tiene aproximadamente el tamao del puo. Estn situados debajo de las [place], uno a cada lado de la columna vertebral CAUSAS  La causa de la infeccin son los microbios, que son organismos microscpicos, que incluyen hongos, virus, y bacterias. Estos organismos son tan pequeos que slo pueden verse a travs del microscopio. Las bacterias son los microorganismos que ms comnmente causan infecciones urinarias.  SNTOMAS  Los sntomas pueden variar segn  la edad y el sexo del paciente y por la ubicacin de la infeccin. Los sntomas en las mujeres jvenes incluyen la necesidad frecuente e intensa de orinar y una sensacin dolorosa de ardor en la vejiga o en la uretra durante la miccin. Las mujeres y los hombres mayores podrn sentir  cansancio, temblores y debilidad y Arts development officer musculares y Social research officer, government abdominal. Si tiene [place], puede significar que la infeccin est en los riones. Otros sntomas son dolor en la espalda o en los lados debajo de las [place], nuseas y vmitos.  DIAGNSTICO  Para diagnosticar una infeccin urinaria, el mdico le preguntar acerca de sus sntomas. [employer] una [place] de [place]. La muestra de orina se analiza para Hydrographic surveyor bacterias y glbulos blancos de Herbalist. Los glbulos blancos se forman en el organismo para ayudar a Radio broadcast assistant las infecciones.  TRATAMIENTO  Por lo general, las infecciones urinarias pueden tratarse con medicamentos. Debido a que la [employer] de las infecciones son causadas por bacterias, por lo general pueden tratarse con antibiticos. La eleccin del antibitico y la duracin del tratamiento depender de sus sntomas y el tipo de bacteria causante de la infeccin.  INSTRUCCIONES PARA EL CUIDADO EN EL HOGAR   Si le recetaron antibiticos, tmelos exactamente como su mdico le indique. Termine el medicamento aunque se sienta mejor despus de haber tomado slo algunos.  Beba gran cantidad de lquido para mantener la orina de tono claro o color amarillo plido.  Evite la cafena, el t y las bebidas gaseosas. Estas sustancias irritan la vejiga.  Vaciar la vejiga con frecuencia. Evite retener la orina durante largos perodos.  Vace la vejiga antes y despus de Clinical biochemist.  Despus de mover el intestino, las mujeres deben higienizarse la regin perineal desde adelante hacia atrs. Use slo un papel tissue por vez. SOLICITE ATENCIN MDICA SI:   Siente dolor en la espalda.  Le sube la fiebre.  Los sntomas no mejoran luego de 3 das. SOLICITE ATENCIN MDICA DE INMEDIATO SI:   Siente dolor intenso en la espalda o en la zona inferior del abdomen.  Comienza a sentir escalofros.  Tiene nuseas o vmitos.  Tiene una sensacin continua de  quemazn o molestias al [employer]. ASEGRESE DE QUE:   Comprende estas instrucciones.  Controlar su enfermedad.  Solicitar ayuda de inmediato si no mejora o empeora.   Esta informacin no tiene Marine scientist el consejo del mdico. Asegrese de hacerle al mdico cualquier pregunta que tenga.   Document Released: 03/04/2005 Document Revised: 02/17/2012 Elsevier Interactive Patient Education [employer].

## 2015-12-19 LAB — URINE CULTURE
Culture: >=100000 — AB
SPECIAL REQUESTS: NORMAL

## 2015-12-20 ENCOUNTER — Telehealth: Payer: Self-pay | Admitting: *Deleted

## 2015-12-20 NOTE — Telephone Encounter (Signed)
Post ED Visit - Positive Culture Follow-up  Culture report reviewed by antimicrobial stewardship pharmacist:  []  Elenor Quinones, Pharm.D. []  Heide Guile, Pharm.D., BCPS []  Parks Neptune, Pharm.D. []  Alycia Rossetti, Pharm.D., BCPS []  Harrison, Pharm.D., BCPS, AAHIVP []  Legrand Como, Pharm.D., BCPS, AAHIVP []  Milus Glazier, Pharm.D. []  Stephens November, Pharm.D.  Positive urine culture Treated with Cephalexin, organism sensitive to the same and no further patient follow-up is required at this time.  Harlon Flor [facility] 12/20/2015, 11:12 AM

## 2016-01-06 ENCOUNTER — Encounter (HOSPITAL_COMMUNITY): Payer: Self-pay | Admitting: Emergency Medicine

## 2016-01-06 ENCOUNTER — Ambulatory Visit (HOSPITAL_COMMUNITY)
Admission: EM | Admit: 2016-01-06 | Discharge: 2016-01-06 | Disposition: A | Discharge status: Home / Self Care | Payer: Self-pay | Attending: Family Medicine | Admitting: Family Medicine

## 2016-01-06 DIAGNOSIS — G43101 Migraine with aura, not intractable, with status migrainosus: Secondary | ICD-10-CM

## 2016-01-06 DIAGNOSIS — M542 Cervicalgia: Secondary | ICD-10-CM

## 2016-01-06 MED ORDER — ONDANSETRON 4 MG PO TBDP
ORAL_TABLET | ORAL | Status: AC
  Filled 2016-01-06: qty 1

## 2016-01-06 MED ORDER — ONDANSETRON 4 MG PO TBDP
4.0000 mg | ORAL_TABLET | Freq: Once | ORAL | Status: AC
  Administered 2016-01-06: 4 mg via ORAL

## 2016-01-06 MED ORDER — NAPROXEN 500 MG PO TABS: 500.0000 mg | ORAL_TABLET | Freq: Two times a day (BID) | ORAL | 0 refills | Status: DC

## 2016-01-06 MED ORDER — SUMATRIPTAN SUCCINATE 100 MG PO TABS: 100.0000 mg | ORAL_TABLET | ORAL | 0 refills | Status: DC | PRN

## 2016-01-06 NOTE — ED Provider Notes (Signed)
CSN: [id number]     Arrival date & time 01/06/16  1458 History   First MD Initiated Contact with Patient 01/06/16 1601     No chief complaint on file.  (Consider location/radiation/quality/duration/timing/severity/associated sxs/prior Treatment) Patient c/o nausea, scotomas, pulsatile headache and neck discomfort.    Headache  Pain location:  L parietal Quality:  Dull Radiates to:  L neck Severity currently:  5/10 Severity at highest:  8/10 Onset quality:  Sudden Duration:  1 day Timing:  Constant Progression:  Worsening Chronicity:  New Similar to prior headaches: yes   Context: bright light   Relieved by:  Nothing Worsened by:  Nothing Ineffective treatments:  None tried   Past Medical History:  Diagnosis Date  . Asthma   . History of blood transfusion 1999   w/vaginal delivery  . History of bronchitis    "I've stayed in hospital 2 X w/bronchitis"   Past Surgical History:  Procedure Laterality Date  . CESAREAN SECTION  1997; 2008  . CHOLECYSTECTOMY  2011  . TUBAL LIGATION  04/2007   Family History  Problem Relation Age of Onset  . Diabetes Mother   . Osteoarthritis Mother   . Hypertension Mother    Social History  Substance Use Topics  . Smoking status: Never Smoker  . Smokeless tobacco: Never Used  . Alcohol use No   OB History    Gravida Para Term Preterm AB Living   3 3 3     3    SAB TAB Ectopic Multiple Live Births                 Review of Systems  Constitutional: Negative.   Eyes: Negative.   Respiratory: Negative.   Cardiovascular: Negative.   Gastrointestinal: Negative.   Endocrine: Negative.   Genitourinary: Negative.   Musculoskeletal: Negative.   Allergic/Immunologic: Negative.   Neurological: Positive for headaches.  Hematological: Negative.   Psychiatric/Behavioral: Negative.     Allergies  Review of patient's allergies indicates no known allergies.  Home Medications   Prior to Admission medications   Medication Sig  Start Date End Date Taking? Authorizing Provider  albuterol (PROVENTIL HFA;VENTOLIN HFA) 108 (90 Base) MCG/ACT inhaler Inhale 2 puffs into the lungs every 6 (six) hours as needed for wheezing. 09/05/15   Lance Bosch, NP  amoxicillin (AMOXIL) 500 MG capsule Take 1 capsule (500 mg total) by mouth 3 (three) times daily. 11/20/15   Tiffany Daneil Dan, PA-C  benzonatate (TESSALON) 200 MG capsule Take 1 capsule (200 mg total) by mouth 2 (two) times daily as needed for cough. 11/20/15   Tiffany Daneil Dan, PA-C  cephALEXin (KEFLEX) 500 MG capsule Take 1 capsule (500 mg total) by mouth 2 (two) times daily. 12/17/15   Tiffany Carlota Raspberry, PA-C  cetirizine (ZYRTEC) 10 MG tablet Take 1 tablet (10 mg total) by mouth daily. For allergies 09/05/15   Lance Bosch, NP  diclofenac (VOLTAREN) 50 MG EC tablet Take 1 tablet (50 mg total) by mouth 3 (three) times daily. For 5 days, then as needed. Patient not taking: Reported on 11/14/2015 07/07/15   Melony Overly, MD  fluticasone [facility]) 50 MCG/ACT nasal spray Place 2 sprays into both nostrils daily. Allergies Patient not taking: Reported on 01/11/2015 09/27/14   Lance Bosch, NP  ibuprofen (ADVIL,MOTRIN) 600 MG tablet Take 1 tablet (600 mg total) by mouth 2 (two) times daily as needed. Take twice daily for at least 5-7 days Patient not taking: Reported on 11/14/2015 01/11/15  Lance Bosch, NP  naproxen (NAPROSYN) 500 MG tablet Take 1 tablet (500 mg total) by mouth 2 (two) times daily with a meal. 01/06/16   Lysbeth Penner, FNP  phenazopyridine (PYRIDIUM) 200 MG tablet Take 1 tablet (200 mg total) by mouth 3 (three) times daily. 12/17/15   Tiffany Carlota Raspberry, PA-C  risperiDONE (RISPERDAL) 2 MG tablet Take 1 tablet (2 mg total) by mouth at bedtime. Patient not taking: Reported on 01/11/2015 07/09/14   Niel Hummer, NP  SUMAtriptan (IMITREX) 100 MG tablet Take 1 tablet (100 mg total) by mouth every 2 (two) hours as needed for migraine. May repeat in 2 hours if headache persists or recurs.  01/06/16   Lysbeth Penner, FNP  Vitamin D, Ergocalciferol, (DRISDOL) 50000 units CAPS capsule Take 50,000 Units by mouth every 7 (seven) days.    Historical Provider, MD   Meds Ordered and Administered this Visit   Medications  ondansetron (ZOFRAN-ODT) disintegrating tablet 4 mg (not administered)    BP 107/67 (BP Location: Left Arm)   Pulse 78   Temp 98 F (36.7 C) (Oral)   Resp 12   LMP 11/23/2015   SpO2 98%  No data found.   Physical Exam  Constitutional: She is oriented to person, place, and time. She appears well-developed and well-nourished.  HENT:  Head: Normocephalic and atraumatic.  Neck: Normal range of motion. Neck supple.  Cardiovascular: Normal rate, regular rhythm and normal heart sounds.   Pulmonary/Chest: Effort normal and breath sounds normal.  Neurological: She is alert and oriented to person, place, and time. No cranial nerve deficit.  Nursing note and vitals reviewed.   Urgent Care Course   Clinical Course    Procedures (including critical care time)  Labs Review Labs Reviewed - No data to display  Imaging Review No results found.   Visual Acuity Review  Right Eye Distance:   Left Eye Distance:   Bilateral Distance:    Right Eye Near:   Left Eye Near:    Bilateral Near:         MDM   1. Migraine with aura and with status migrainosus, not intractable   2. Cervicalgia    Imitrex 100mg  one po with onset of headache and repeat in 2 hours prn #10 Naprosyn 500mg  one po bid x 10 days #20     Lysbeth Penner, FNP 01/06/16 1611

## 2016-01-09 ENCOUNTER — Encounter (HOSPITAL_COMMUNITY): Payer: Self-pay

## 2016-01-09 ENCOUNTER — Ambulatory Visit (HOSPITAL_COMMUNITY)
Admission: RE | Admit: 2016-01-09 | Discharge: 2016-01-09 | Disposition: A | Discharge status: Home / Self Care | Payer: Self-pay | Source: Ambulatory Visit | Attending: Obstetrics and Gynecology | Admitting: Obstetrics and Gynecology

## 2016-01-09 VITALS — BP 108/70 | Temp 98.7°F | Ht 61.0 in | Wt 188.6 lb

## 2016-01-09 DIAGNOSIS — N898 Other specified noninflammatory disorders of vagina: Secondary | ICD-10-CM

## 2016-01-09 DIAGNOSIS — Z01419 Encounter for gynecological examination (general) (routine) without abnormal findings: Secondary | ICD-10-CM

## 2016-01-09 HISTORY — DX: Migraine, unspecified, not intractable, without status migrainosus: G43.909

## 2016-01-09 NOTE — Progress Notes (Addendum)
No complaints today.   Pap Smear: Pap smear completed today. Last Pap smear was 01/03/2013 at [facility] and normal with yeast. Per patient has no history of an abnormal Pap smear. Last Pap smear result is in EPIC.  Pelvic/Bimanual   Ext Genitalia No lesions, no swelling and no discharge observed on external genitalia.         Vagina Vagina pink and normal texture. No lesions and thick white cottage cheese appearing discharge observed in vagina. Wet prep completed.         Cervix Cervix is present. Cervix pink and of normal texture. Thick white cottage cheese appearing discharge observed on cervical os.    Uterus Uterus is present and palpable. Uterus in normal position and normal size.        Adnexae Bilateral ovaries present and palpable. No tenderness on palpation.          Rectovaginal No rectal exam completed today since patient had no rectal complaints. No skin abnormalities observed on exam.    Smoking History: Patient has never smoked.  Patient Navigation: Patient education provided. Access to services provided for patient through [facility] program. Spanish interpreter provided.  Used Spanish interpreter Sara Lee from [place].

## 2016-01-09 NOTE — Addendum Note (Signed)
Encounter addended by: Loletta Parish, RN on: 01/09/2016 10:15 AM<BR>    Actions taken: Sign clinical note

## 2016-01-09 NOTE — Addendum Note (Signed)
Encounter addended by: Armond Hang, LPN on: [id number]  [id number] PM<BR>    Actions taken: Order Entry activity accessed

## 2016-01-09 NOTE — Addendum Note (Signed)
Encounter addended by: Loletta Parish, RN on: 01/09/2016 10:22 AM<BR>    Actions taken: Follow-up modified

## 2016-01-09 NOTE — Patient Instructions (Signed)
Explained to Alexandra Norris that Alexandra Norris will cover Pap smears and HPV typing every 5 years unless has a history of abnormal Pap smears. Reminded patient that her next mammogram will be due next June 2018. Let patient know will follow up with her within the next couple weeks with results of Pap smear and wet prep by phone. Alexandra Norris verbalized understanding.  Bryen Hinderman, Arvil Chaco, RN 9:48 AM

## 2016-01-10 LAB — CYTOLOGY - PAP

## 2016-01-14 ENCOUNTER — Telehealth (HOSPITAL_COMMUNITY): Payer: Self-pay | Admitting: *Deleted

## 2016-01-14 ENCOUNTER — Encounter (HOSPITAL_COMMUNITY): Payer: Self-pay | Admitting: *Deleted

## 2016-01-14 ENCOUNTER — Other Ambulatory Visit (HOSPITAL_COMMUNITY): Payer: Self-pay | Admitting: *Deleted

## 2016-01-14 DIAGNOSIS — B379 Candidiasis, unspecified: Secondary | ICD-10-CM

## 2016-01-14 DIAGNOSIS — B9689 Other specified bacterial agents as the cause of diseases classified elsewhere: Secondary | ICD-10-CM

## 2016-01-14 DIAGNOSIS — N760 Acute vaginitis: Principal | ICD-10-CM

## 2016-01-14 MED ORDER — FLUCONAZOLE 150 MG PO TABS: 150.0000 mg | ORAL_TABLET | Freq: Once | ORAL | 0 refills | Status: AC

## 2016-01-14 MED ORDER — METRONIDAZOLE 500 MG PO TABS: 500.0000 mg | ORAL_TABLET | Freq: Two times a day (BID) | ORAL | 0 refills | Status: DC

## 2016-01-14 NOTE — Telephone Encounter (Signed)
Telephoned patient at home # and discussed negative pap smear results. HPV was negative. Next pap smear due in 5 years. Wet prep results did show yeast and bacterial vaginosis. Advised patient medication was called into pharmacy. Advised patient with Flagyl to make sure to finish all medication and to refrain from alcohol while taking medication. Patient voiced understanding. Used interpreter Anastasio Auerbach.

## 2016-02-18 DIAGNOSIS — J342 Deviated nasal septum: Secondary | ICD-10-CM | POA: Insufficient documentation

## 2016-02-18 DIAGNOSIS — J3489 Other specified disorders of nose and nasal sinuses: Secondary | ICD-10-CM | POA: Insufficient documentation

## 2016-02-18 HISTORY — DX: Other specified disorders of nose and nasal sinuses: J34.89

## 2016-06-02 ENCOUNTER — Encounter (HOSPITAL_COMMUNITY): Payer: Self-pay | Admitting: Emergency Medicine

## 2016-06-02 ENCOUNTER — Ambulatory Visit (HOSPITAL_COMMUNITY)
Admission: EM | Admit: 2016-06-02 | Discharge: 2016-06-02 | Disposition: A | Discharge status: Home / Self Care | Payer: Self-pay | Attending: Family Medicine | Admitting: Family Medicine

## 2016-06-02 DIAGNOSIS — G5602 Carpal tunnel syndrome, left upper limb: Secondary | ICD-10-CM

## 2016-06-02 MED ORDER — PREDNISONE 20 MG PO TABS: ORAL_TABLET | ORAL | 0 refills | Status: DC

## 2016-06-02 NOTE — ED Triage Notes (Signed)
The patient presented to the [facility] with a complaint of left hand pain and tingling as well as weakness x 3 days.

## 2016-06-02 NOTE — ED Provider Notes (Signed)
[place]    CSN: [id number] Arrival date & time: 06/02/16  1439     History   Chief Complaint Chief Complaint  Patient presents with  . Hand Pain    HPI Alexandra Norris is a 40 y.o. female.   This is a 40 year old woman who presents to the [facility] urgent care center with left hand numbness and tingling for 3 days. 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 She doesn't have to be back to work until Thursday. The volar wrist area is pulsating with pain.  She does have some mild aching in her neck and she's thinks that she's been weaker in the left hand since this began.      Past Medical History:  Diagnosis Date  . Asthma   . History of blood transfusion 1999   w/vaginal delivery  . History of bronchitis    "I've stayed in hospital 2 X w/bronchitis"  . Migraine     Patient Active Problem List   Diagnosis Date Noted  . Pre-diabetes 10/21/2015  . Severe recurrent major depressive disorder with psychotic features ([place])   . Depressive disorder 07/06/2014  . Screening for malignant neoplasm of the cervix 01/03/2013  . GASTROESOPHAGEAL REFLUX DISEASE 08/14/2009  . ETHMOIDAL SINUSITIS, ACUTE 05/14/2009  . DEPRESSION, SITUATIONAL, PROLONGED 02/05/2009  . UNSPECIFIED VISUAL LOSS 11/27/2008  . CHALAZION, LEFT 11/27/2008  . OBESITY 05/30/2007  . IRREGULAR MENSTRUAL CYCLE 05/30/2007  . Asthma 12/06/2006    Past Surgical History:  Procedure Laterality Date  . CESAREAN SECTION  1997; 2008  . CHOLECYSTECTOMY  2011  . TUBAL LIGATION  04/2007    OB History    Gravida Para Term Preterm AB Living   3 3 3     3    SAB TAB Ectopic Multiple Live Births                   Home Medications    Prior to Admission medications   Medication Sig Start Date End Date Taking? Authorizing Provider  predniSONE (DELTASONE) 20 MG tablet Two daily with food 06/02/16   Robyn Haber, MD    Family History Family History  Problem Relation Age of Onset  .  Diabetes Mother   . Osteoarthritis Mother   . Hypertension Mother     Social History Social History  Substance Use Topics  . Smoking status: Never Smoker  . Smokeless tobacco: Never Used  . Alcohol use No     Allergies   Patient has no known allergies.   Review of Systems Review of Systems  Constitutional: Negative.   Musculoskeletal: Positive for arthralgias.     Physical Exam Triage Vital Signs ED Triage Vitals  Enc Vitals Group     BP 06/02/16 1517 116/59     Pulse Rate 06/02/16 1517 76     Resp 06/02/16 1517 18     Temp 06/02/16 1517 98.9 F (37.2 C)     Temp Source 06/02/16 1517 Oral     SpO2 06/02/16 1517 100 %     Weight --      Height --      Head Circumference --      Peak Flow --      Pain Score 06/02/16 1520 8     Pain Loc --      Pain Edu? --      Excl. in [place]? --    No data found.   Updated Vital Signs BP 116/59 (BP Location: Left Arm)  Pulse 76   Temp 98.9 F (37.2 C) (Oral)   Resp 18   SpO2 100%    Physical Exam  Constitutional: She is oriented to person, place, and time. She appears well-developed and well-nourished.  HENT:  Right Ear: External ear normal.  Left Ear: External ear normal.  Eyes: Conjunctivae and EOM are normal.  Neck: Normal range of motion. Neck supple.  Musculoskeletal: Normal range of motion. She exhibits tenderness. She exhibits no deformity.  Neurological: She is alert and oriented to person, place, and time.  Skin: Skin is warm and dry.  Nursing note and vitals reviewed.    UC Treatments / Results  Labs (all labs ordered are listed, but only abnormal results are displayed) Labs Reviewed - No data to display  EKG  EKG Interpretation None       Radiology No results found.  Procedures Procedures (including critical care time)  Medications Ordered in UC Medications - No data to display   Initial Impression / Assessment and Plan / UC Course  I have reviewed the triage vital signs and the  nursing notes.  Pertinent labs & imaging results that were available during my care of the patient were reviewed by me and considered in my medical decision making (see chart for details).  Clinical Course     Final Clinical Impressions(s) / UC Diagnoses   Final diagnoses:  Carpal tunnel syndrome of left wrist    New Prescriptions New Prescriptions   PREDNISONE (DELTASONE) 20 MG TABLET    Two daily with food  Wrist splint applied.   Robyn Haber, MD 06/02/16 [id number]

## 2016-08-25 ENCOUNTER — Emergency Department (HOSPITAL_COMMUNITY): Payer: Self-pay

## 2016-08-25 ENCOUNTER — Emergency Department (HOSPITAL_COMMUNITY)
Admission: EM | Admit: 2016-08-25 | Discharge: 2016-08-26 | Disposition: A | Discharge status: Home / Self Care | Payer: Self-pay | Attending: Emergency Medicine | Admitting: Emergency Medicine

## 2016-08-25 ENCOUNTER — Encounter (HOSPITAL_COMMUNITY): Payer: Self-pay

## 2016-08-25 DIAGNOSIS — R51 Headache: Secondary | ICD-10-CM | POA: Insufficient documentation

## 2016-08-25 DIAGNOSIS — R519 Headache, unspecified: Secondary | ICD-10-CM

## 2016-08-25 DIAGNOSIS — J45909 Unspecified asthma, uncomplicated: Secondary | ICD-10-CM | POA: Insufficient documentation

## 2016-08-25 LAB — BASIC METABOLIC PANEL
Anion gap: 11 (ref 5–15)
BUN: 7 mg/dL (ref 6–20)
CHLORIDE: 104 mmol/L (ref 101–111)
CO2: 24 mmol/L (ref 22–32)
Calcium: 9.3 mg/dL (ref 8.9–10.3)
Creatinine, Ser: 0.49 mg/dL (ref 0.44–1.00)
GFR calc Af Amer: >60 mL/min (ref >60)
GFR calc non Af Amer: >60 mL/min (ref >60)
GLUCOSE: 89 mg/dL (ref 65–99)
Potassium: 3.5 mmol/L (ref 3.5–5.1)
Sodium: 139 mmol/L (ref 135–145)

## 2016-08-25 LAB — CBC WITH DIFFERENTIAL/PLATELET
Basophils Absolute: 0 10*3/uL (ref 0.0–0.1)
Basophils Relative: 0 %
EOS PCT: 3 %
Eosinophils Absolute: 0.4 10*3/uL (ref 0.0–0.7)
HEMATOCRIT: 37.1 % (ref 36.0–46.0)
Hemoglobin: 11.9 g/dL — ABNORMAL LOW (ref 12.0–15.0)
LYMPHS ABS: 3.3 10*3/uL (ref 0.7–4.0)
Lymphocytes Relative: 26 %
MCH: 25.4 pg — ABNORMAL LOW (ref 26.0–34.0)
MCHC: 32.1 g/dL (ref 30.0–36.0)
MCV: 79.3 fL (ref 78.0–100.0)
MONO ABS: 1.1 10*3/uL — AB (ref 0.1–1.0)
Monocytes Relative: 8 %
Neutro Abs: 8.3 10*3/uL — ABNORMAL HIGH (ref 1.7–7.7)
Neutrophils Relative %: 63 %
PLATELETS: 260 10*3/uL (ref 150–400)
RBC: 4.68 MIL/uL (ref 3.87–5.11)
RDW: 14.9 % (ref 11.5–15.5)
WBC: 13.1 10*3/uL — ABNORMAL HIGH (ref 4.0–10.5)

## 2016-08-25 IMAGING — CT CT HEAD W/O CM
3 series · 15 of 47 positions shown, 18 images · non-contrast
Comparison: None.

CLINICAL DATA: Subacute onset of headache.  Initial encounter.

EXAM:
CT HEAD WITHOUT CONTRAST
TECHNIQUE: Contiguous axial images were obtained from the base of the skull
through the vertex without intravenous contrast.

[Series 3: head 5.0 h30s · axial · 0.39mm/px · z∈[-84,+46]mm · 9 of 32 slices shown, 12 images]
[im 3/32  brain]
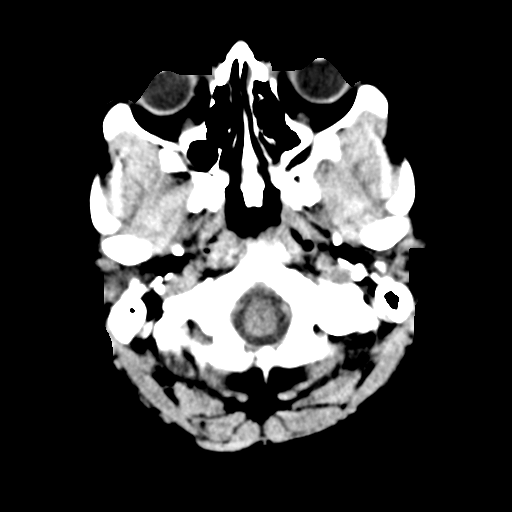
[im 3/32  bone]
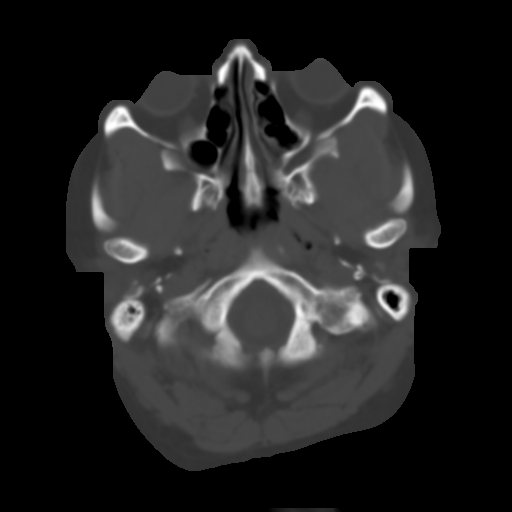
[im 6/32  brain]
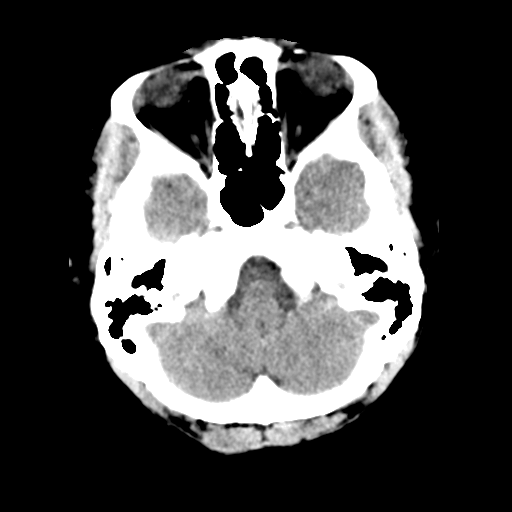
[im 9/32  brain]
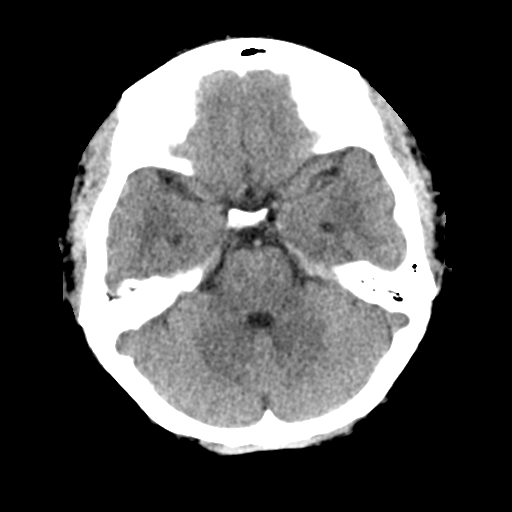
[im 12/32  brain]
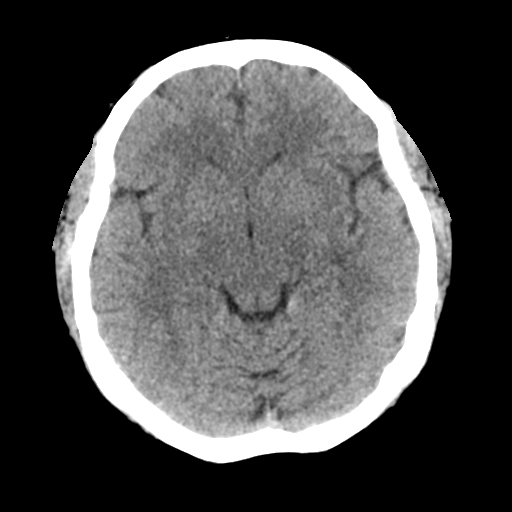
[im 17/32  brain]
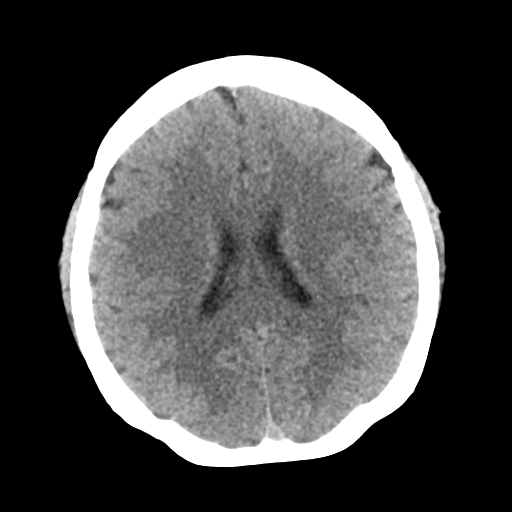
[im 17/32  bone]
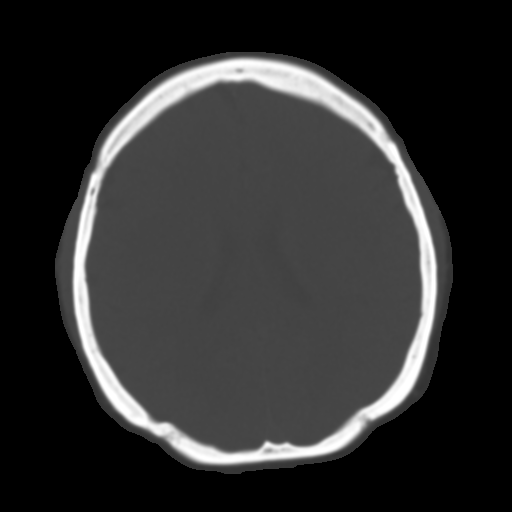
[im 20/32  brain]
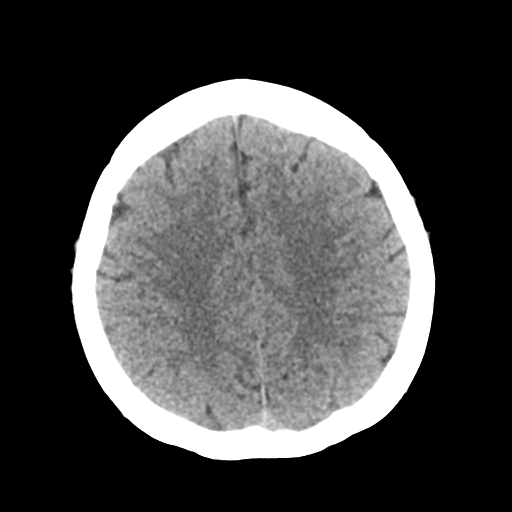
[im 23/32  brain]
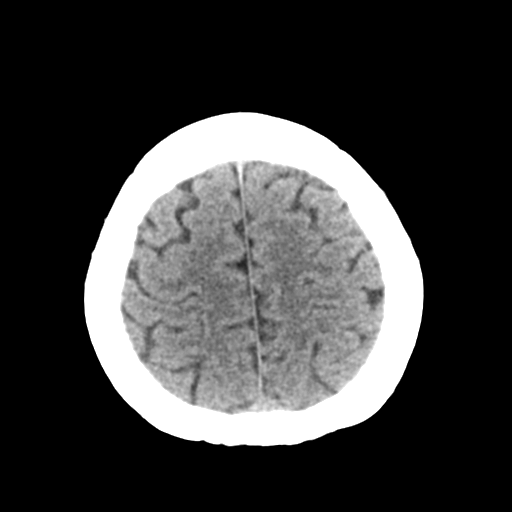
[im 26/32  brain]
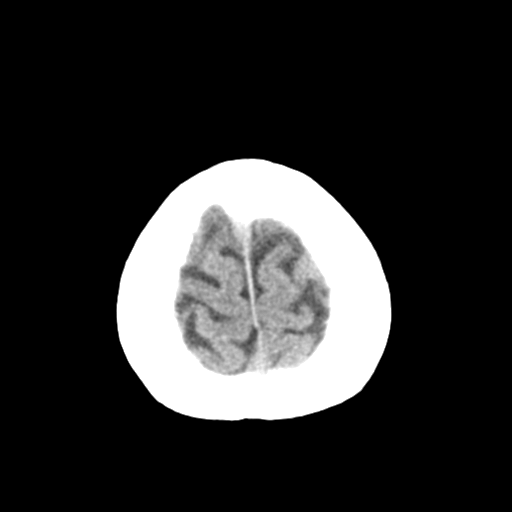
[im 29/32  brain]
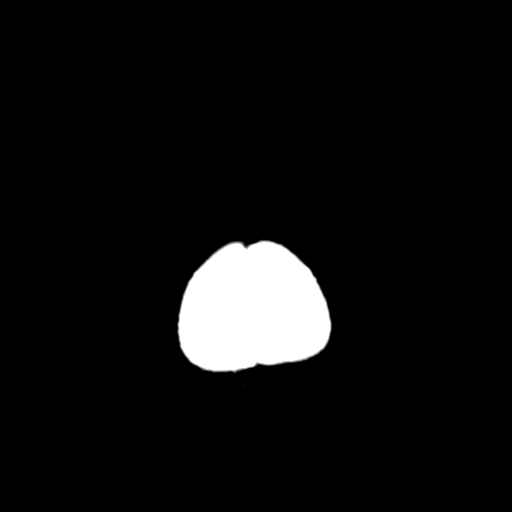
[im 29/32  bone]
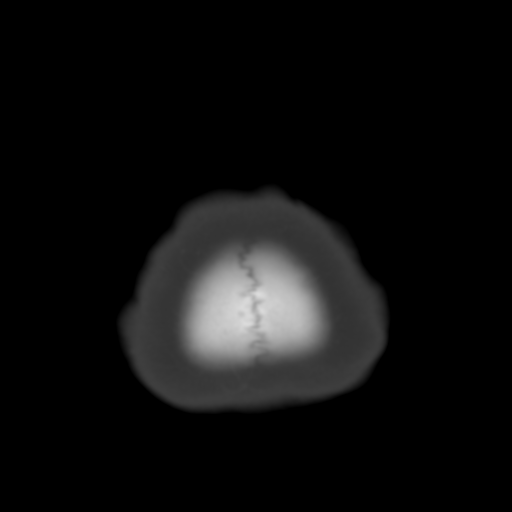

[Series 5: head 3.0 mpr cor · coronal · 0.31mm/px · 3 of 67 slices shown]
[im 23/67  brain]
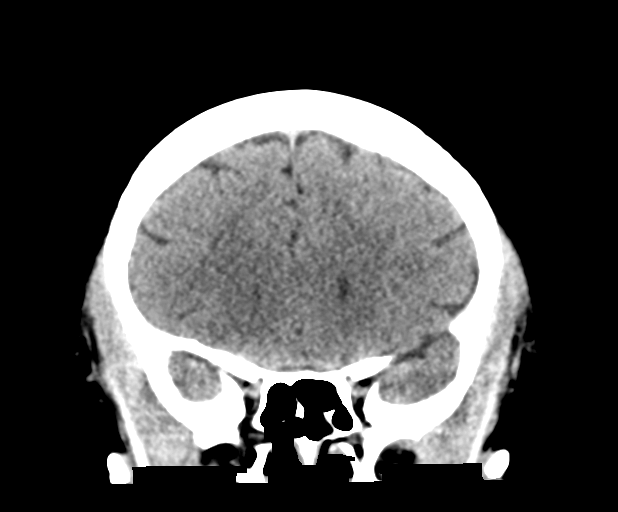
[im 30/67  brain]
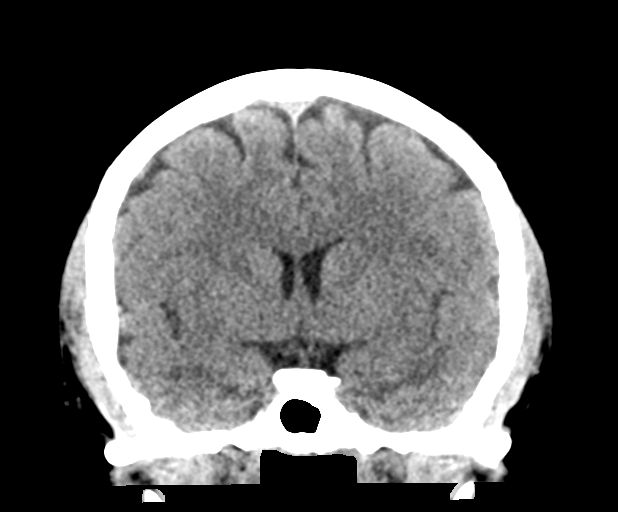
[im 37/67  brain]
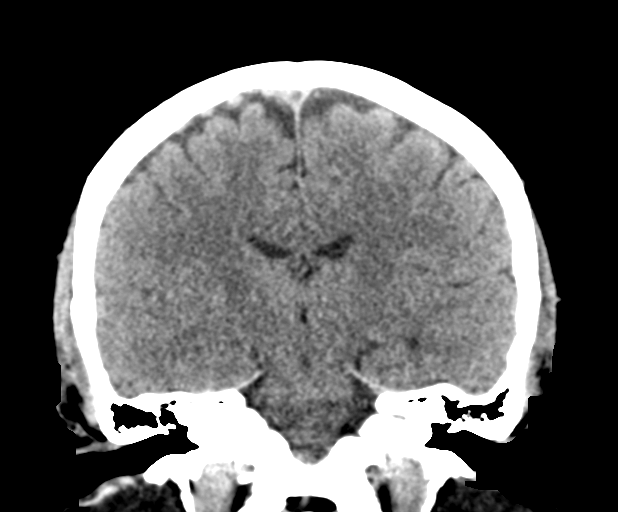

[Series 6: head 3.0 mpr sag · sagittal · 0.31mm/px · 3 of 67 slices shown]
[im 23/67  brain]
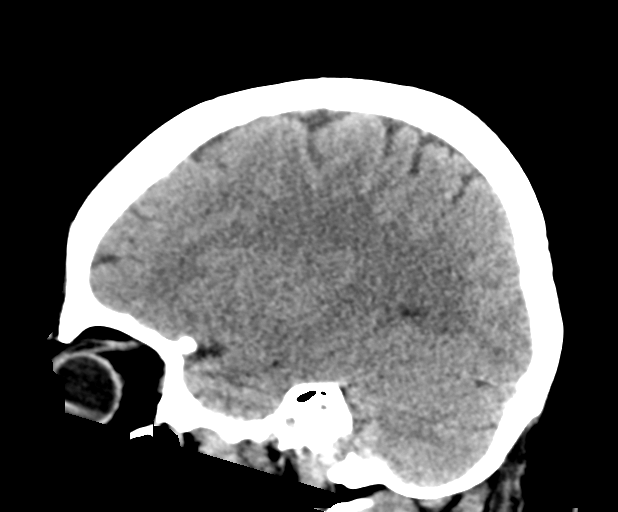
[im 34/67  brain]
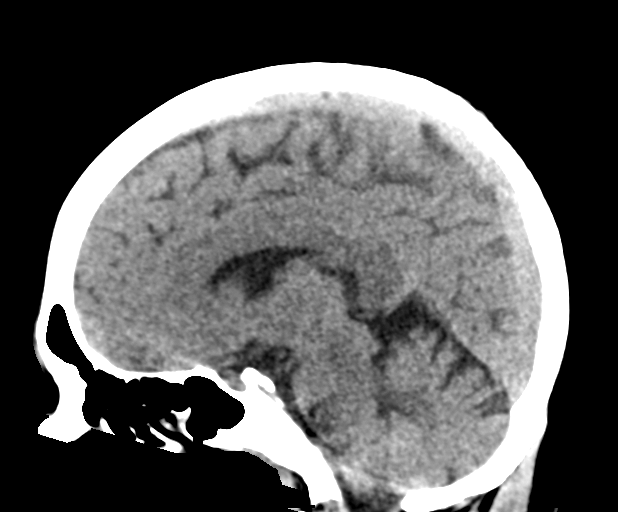
[im 45/67  brain]
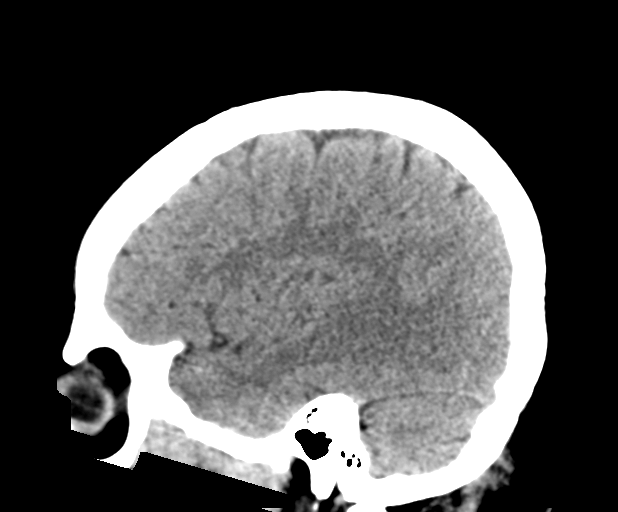

[15 of 47 positions shown; findings below may reference images not displayed]

FINDINGS: Brain: No evidence of acute infarction, hemorrhage, hydrocephalus,
extra-axial collection or mass lesion/mass effect.

The posterior fossa, including the cerebellum, brainstem and fourth
ventricle, is within normal limits. The third and lateral
ventricles, and basal ganglia are unremarkable in appearance. The
cerebral hemispheres are symmetric in appearance, with normal
gray-white differentiation. No mass effect or midline shift is seen.

Vascular: No hyperdense vessel or unexpected calcification.

Skull: There is no evidence of fracture; visualized osseous
structures are unremarkable in appearance.

Sinuses/Orbits: The visualized portions of the orbits are within
normal limits. The paranasal sinuses and mastoid air cells are
well-aerated.

Other: No significant soft tissue abnormalities are seen.
IMPRESSION: Unremarkable noncontrast CT of the head.

## 2016-08-25 MED ORDER — PROMETHAZINE HCL 25 MG/ML IJ SOLN
12.5000 mg | Freq: Once | INTRAMUSCULAR | Status: AC
  Administered 2016-08-25: 12.5 mg via INTRAVENOUS
  Filled 2016-08-25: qty 1

## 2016-08-25 MED ORDER — SODIUM CHLORIDE 0.9 % IV BOLUS (SEPSIS): 250.0000 mL | Freq: Once | INTRAVENOUS | Status: DC

## 2016-08-25 MED ORDER — SODIUM CHLORIDE 0.9 % IV BOLUS (SEPSIS)
1000.0000 mL | Freq: Once | INTRAVENOUS | Status: AC
  Administered 2016-08-25: 1000 mL via INTRAVENOUS

## 2016-08-25 MED ORDER — DIPHENHYDRAMINE HCL 50 MG/ML IJ SOLN
25.0000 mg | Freq: Once | INTRAMUSCULAR | Status: AC
  Administered 2016-08-25: 25 mg via INTRAVENOUS
  Filled 2016-08-25: qty 1

## 2016-08-25 MED ORDER — DEXAMETHASONE SODIUM PHOSPHATE 10 MG/ML IJ SOLN
10.0000 mg | Freq: Once | INTRAMUSCULAR | Status: AC
  Administered 2016-08-25: 10 mg via INTRAVENOUS
  Filled 2016-08-25: qty 1

## 2016-08-25 MED ORDER — KETOROLAC TROMETHAMINE 15 MG/ML IJ SOLN
15.0000 mg | Freq: Once | INTRAMUSCULAR | Status: AC
  Administered 2016-08-25: 15 mg via INTRAVENOUS
  Filled 2016-08-25: qty 1

## 2016-08-25 MED ORDER — SODIUM CHLORIDE 0.9 % IV SOLN: INTRAVENOUS | Status: DC

## 2016-08-25 NOTE — ED Provider Notes (Addendum)
[place] DEPT Provider Note   CSN: [id number] Arrival date & time: 08/25/16  1349     History   Chief Complaint Chief Complaint  Patient presents with  . Headache    HPI Alexandra Norris is a 41 y.o. female.  Patient denies history of migraines but previous visits show that she has had the complaint of headaches. Has been diagnosed at least clinically with migraines. Patient states she's had a headache ongoing now for 3 weeks. Worse the last 2 days. Associated with photophobia and nausea and vomiting. Patient denies any neck stiffness. Headache is on top of the head. It is bilateral. Nursing reported blurred vision but patient states that her eyes are sensitive to the light. Patient took ibuprofen and Tylenol at home without any relief.      Past Medical History:  Diagnosis Date  . Asthma   . History of blood transfusion 1999   w/vaginal delivery  . History of bronchitis    "I've stayed in hospital 2 X w/bronchitis"  . Migraine     Patient Active Problem List   Diagnosis Date Noted  . Pre-diabetes 10/21/2015  . Severe recurrent major depressive disorder with psychotic features ([place])   . Depressive disorder 07/06/2014  . Screening for malignant neoplasm of the cervix 01/03/2013  . GASTROESOPHAGEAL REFLUX DISEASE 08/14/2009  . ETHMOIDAL SINUSITIS, ACUTE 05/14/2009  . DEPRESSION, SITUATIONAL, PROLONGED 02/05/2009  . UNSPECIFIED VISUAL LOSS 11/27/2008  . CHALAZION, LEFT 11/27/2008  . OBESITY 05/30/2007  . IRREGULAR MENSTRUAL CYCLE 05/30/2007  . Asthma 12/06/2006    Past Surgical History:  Procedure Laterality Date  . CESAREAN SECTION  1997; 2008  . CHOLECYSTECTOMY  2011  . TUBAL LIGATION  04/2007    OB History    Gravida Para Term Preterm AB Living   3 3 3     3    SAB TAB Ectopic Multiple Live Births                   Home Medications    Prior to Admission medications   Medication Sig Start Date End Date Taking? Authorizing Provider    ibuprofen (ADVIL,MOTRIN) 200 MG tablet Take 1,000 mg by mouth every 6 (six) hours as needed for moderate pain.    Yes Historical Provider, MD    Family History Family History  Problem Relation Age of Onset  . Diabetes Mother   . Osteoarthritis Mother   . Hypertension Mother     Social History Social History  Substance Use Topics  . Smoking status: Never Smoker  . Smokeless tobacco: Never Used  . Alcohol use No     Allergies   Patient has no known allergies.   Review of Systems Review of Systems  Constitutional: Positive for fatigue. Negative for fever.  HENT: Negative for congestion.   Eyes: Positive for photophobia.  Respiratory: Negative for shortness of breath.   Cardiovascular: Negative for chest pain.  Gastrointestinal: Positive for nausea and vomiting. Negative for abdominal pain.  Genitourinary: Negative for dysuria.  Musculoskeletal: Negative for neck stiffness.  Skin: Negative for rash.  Neurological: Positive for headaches.  Hematological: Does not bruise/bleed easily.  Psychiatric/Behavioral: Negative for confusion.     Physical Exam Updated Vital Signs BP 105/65 (BP Location: Right Arm)   Pulse 65   Temp 98.6 F (37 C) (Oral)   Resp 16   LMP 08/08/2016   SpO2 100%   Physical Exam  Constitutional: She is oriented to person, place, and time. She appears well-developed and  well-nourished. No distress.  HENT:  Head: Normocephalic and atraumatic.  Mouth/Throat: Oropharynx is clear and moist.  Eyes: Conjunctivae and EOM are normal. Pupils are equal, round, and reactive to light.  Neck: Normal range of motion. Neck supple.  Cardiovascular: Normal rate, regular rhythm and normal heart sounds.   Pulmonary/Chest: Effort normal and breath sounds normal. No respiratory distress.  Abdominal: Soft. Bowel sounds are normal. There is no tenderness.  Musculoskeletal: Normal range of motion.  Neurological: She is alert and oriented to person, place, and time.  No cranial nerve deficit or sensory deficit. She exhibits normal muscle tone. Coordination normal.  Skin: Skin is warm.  Nursing note and vitals reviewed.    ED Treatments / Results  Labs (all labs ordered are listed, but only abnormal results are displayed) Labs Reviewed  CBC WITH DIFFERENTIAL/PLATELET - Abnormal; Notable for the following:       Result Value   WBC 13.1 (*)    Hemoglobin 11.9 (*)    MCH 25.4 (*)    Neutro Abs 8.3 (*)    Monocytes Absolute 1.1 (*)    All other components within normal limits  BASIC METABOLIC PANEL    EKG  EKG Interpretation None       Radiology Ct Head Wo Contrast  Result Date: 08/25/2016 CLINICAL DATA:  Subacute onset of headache.  Initial encounter. EXAM: CT HEAD WITHOUT CONTRAST TECHNIQUE: Contiguous axial images were obtained from the base of the skull through the vertex without intravenous contrast. COMPARISON:  None. FINDINGS: Brain: No evidence of acute infarction, hemorrhage, hydrocephalus, extra-axial collection or mass lesion/mass effect. The posterior fossa, including the cerebellum, brainstem and fourth ventricle, is within normal limits. The third and lateral ventricles, and basal ganglia are unremarkable in appearance. The cerebral hemispheres are symmetric in appearance, with normal gray-white differentiation. No mass effect or midline shift is seen. Vascular: No hyperdense vessel or unexpected calcification. Skull: There is no evidence of fracture; visualized osseous structures are unremarkable in appearance. Sinuses/Orbits: The visualized portions of the orbits are within normal limits. The paranasal sinuses and mastoid air cells are well-aerated. Other: No significant soft tissue abnormalities are seen. IMPRESSION: Unremarkable noncontrast CT of the head. Electronically Signed   By: Garald Balding M.D.   On: 08/25/2016 23:16    Procedures Procedures (including critical care time)  Medications Ordered in ED Medications  0.9 %   sodium chloride infusion (not administered)  ketorolac (TORADOL) 15 MG/ML injection 15 mg (not administered)  dexamethasone (DECADRON) injection 10 mg (10 mg Intravenous Given 08/25/16 2057)  diphenhydrAMINE (BENADRYL) injection 25 mg (25 mg Intravenous Given 08/25/16 2057)  promethazine (PHENERGAN) injection 12.5 mg (12.5 mg Intravenous Given 08/25/16 2057)  sodium chloride 0.9 % bolus 1,000 mL (0 mLs Intravenous Stopped 08/25/16 2144)     Initial Impression / Assessment and Plan / ED Course  I have reviewed the triage vital signs and the nursing notes.  Pertinent labs & imaging results that were available during my care of the patient were reviewed by me and considered in my medical decision making (see chart for details).     Patient nontoxic no acute distress. Some improvement with the migraine cocktail Decadron Benadryl and Phenergan and then when head CT was negative Toradol. While patient go home and rest and allow the Decadron to kick in.  Concern for meningitis. No fevers. Slightly elevated leukocytosis. No neck stiffness.  Final Clinical Impressions(s) / ED Diagnoses   Final diagnoses:  Acute intractable headache, unspecified headache type  New Prescriptions New Prescriptions   No medications on file     Fredia Sorrow, MD 08/25/16 [place], MD 08/25/16 2352

## 2016-08-25 NOTE — Discharge Instructions (Signed)
Rest at home in a dark room. The long-acting medication should resolve the headache. Return for any new or worse symptoms. Make appointment to follow-up with your doctor.

## 2016-08-25 NOTE — ED Notes (Signed)
Family at bedside. 

## 2016-08-25 NOTE — ED Triage Notes (Signed)
Onset 2 weeks headache on top of head, blurred vision, nausea, and feels tired.  Past 2 days has been worse.  Taking Ibuprofen and Tylenol with no relief,.   No facial droop, hand grips strong and equal.

## 2016-08-26 NOTE — ED Notes (Signed)
Pt verbalized understanding discharge instructions and denies any further needs or questions at this time. VS stable, ambulatory and steady gait.   

## 2016-10-08 DIAGNOSIS — J302 Other seasonal allergic rhinitis: Secondary | ICD-10-CM

## 2016-10-08 DIAGNOSIS — N946 Dysmenorrhea, unspecified: Secondary | ICD-10-CM | POA: Insufficient documentation

## 2016-10-08 HISTORY — DX: Other seasonal allergic rhinitis: J30.2

## 2017-03-04 ENCOUNTER — Emergency Department (HOSPITAL_COMMUNITY)
Admission: EM | Admit: 2017-03-04 | Discharge: 2017-03-04 | Disposition: A | Discharge status: Home / Self Care | Payer: Self-pay | Attending: Emergency Medicine | Admitting: Emergency Medicine

## 2017-03-04 ENCOUNTER — Emergency Department (HOSPITAL_COMMUNITY): Payer: Self-pay

## 2017-03-04 ENCOUNTER — Encounter (HOSPITAL_COMMUNITY): Payer: Self-pay | Admitting: Emergency Medicine

## 2017-03-04 DIAGNOSIS — K219 Gastro-esophageal reflux disease without esophagitis: Secondary | ICD-10-CM | POA: Insufficient documentation

## 2017-03-04 DIAGNOSIS — Z791 Long term (current) use of non-steroidal anti-inflammatories (NSAID): Secondary | ICD-10-CM | POA: Insufficient documentation

## 2017-03-04 DIAGNOSIS — J45909 Unspecified asthma, uncomplicated: Secondary | ICD-10-CM | POA: Insufficient documentation

## 2017-03-04 LAB — CBC WITH DIFFERENTIAL/PLATELET
Basophils Absolute: 0 10*3/uL (ref 0.0–0.1)
Basophils Relative: 0 %
EOS PCT: 3 %
Eosinophils Absolute: 0.3 10*3/uL (ref 0.0–0.7)
HEMATOCRIT: 38.3 % (ref 36.0–46.0)
Hemoglobin: 12.3 g/dL (ref 12.0–15.0)
LYMPHS PCT: 24 %
Lymphs Abs: 2.5 10*3/uL (ref 0.7–4.0)
MCH: 27 pg (ref 26.0–34.0)
MCHC: 32.1 g/dL (ref 30.0–36.0)
MCV: 84.2 fL (ref 78.0–100.0)
Monocytes Absolute: 0.6 10*3/uL (ref 0.1–1.0)
Monocytes Relative: 6 %
NEUTROS ABS: 6.9 10*3/uL (ref 1.7–7.7)
Neutrophils Relative %: 67 %
PLATELETS: 264 10*3/uL (ref 150–400)
RBC: 4.55 MIL/uL (ref 3.87–5.11)
RDW: 16.3 % — AB (ref 11.5–15.5)
WBC: 10.3 10*3/uL (ref 4.0–10.5)

## 2017-03-04 LAB — COMPREHENSIVE METABOLIC PANEL
ALT: 13 U/L — ABNORMAL LOW (ref 14–54)
AST: 19 U/L (ref 15–41)
Albumin: 3.8 g/dL (ref 3.5–5.0)
Alkaline Phosphatase: 68 U/L (ref 38–126)
Anion gap: 10 (ref 5–15)
BILIRUBIN TOTAL: 0.8 mg/dL (ref 0.3–1.2)
BUN: 12 mg/dL (ref 6–20)
CHLORIDE: 107 mmol/L (ref 101–111)
CO2: 17 mmol/L — ABNORMAL LOW (ref 22–32)
Calcium: 8.6 mg/dL — ABNORMAL LOW (ref 8.9–10.3)
Creatinine, Ser: 0.47 mg/dL (ref 0.44–1.00)
Glucose, Bld: 107 mg/dL — ABNORMAL HIGH (ref 65–99)
POTASSIUM: 4 mmol/L (ref 3.5–5.1)
Sodium: 134 mmol/L — ABNORMAL LOW (ref 135–145)
Total Protein: 7 g/dL (ref 6.5–8.1)

## 2017-03-04 LAB — I-STAT TROPONIN, ED: Troponin i, poc: 0 ng/mL (ref 0.00–0.08)

## 2017-03-04 IMAGING — CR DG CHEST 2V
2 series · 2 of 2 positions shown · non-contrast
Comparison: [DATE]

CLINICAL DATA: Chest pain and shortness of breath for 4 days.

EXAM:
CHEST  2 VIEW

[chest pa]
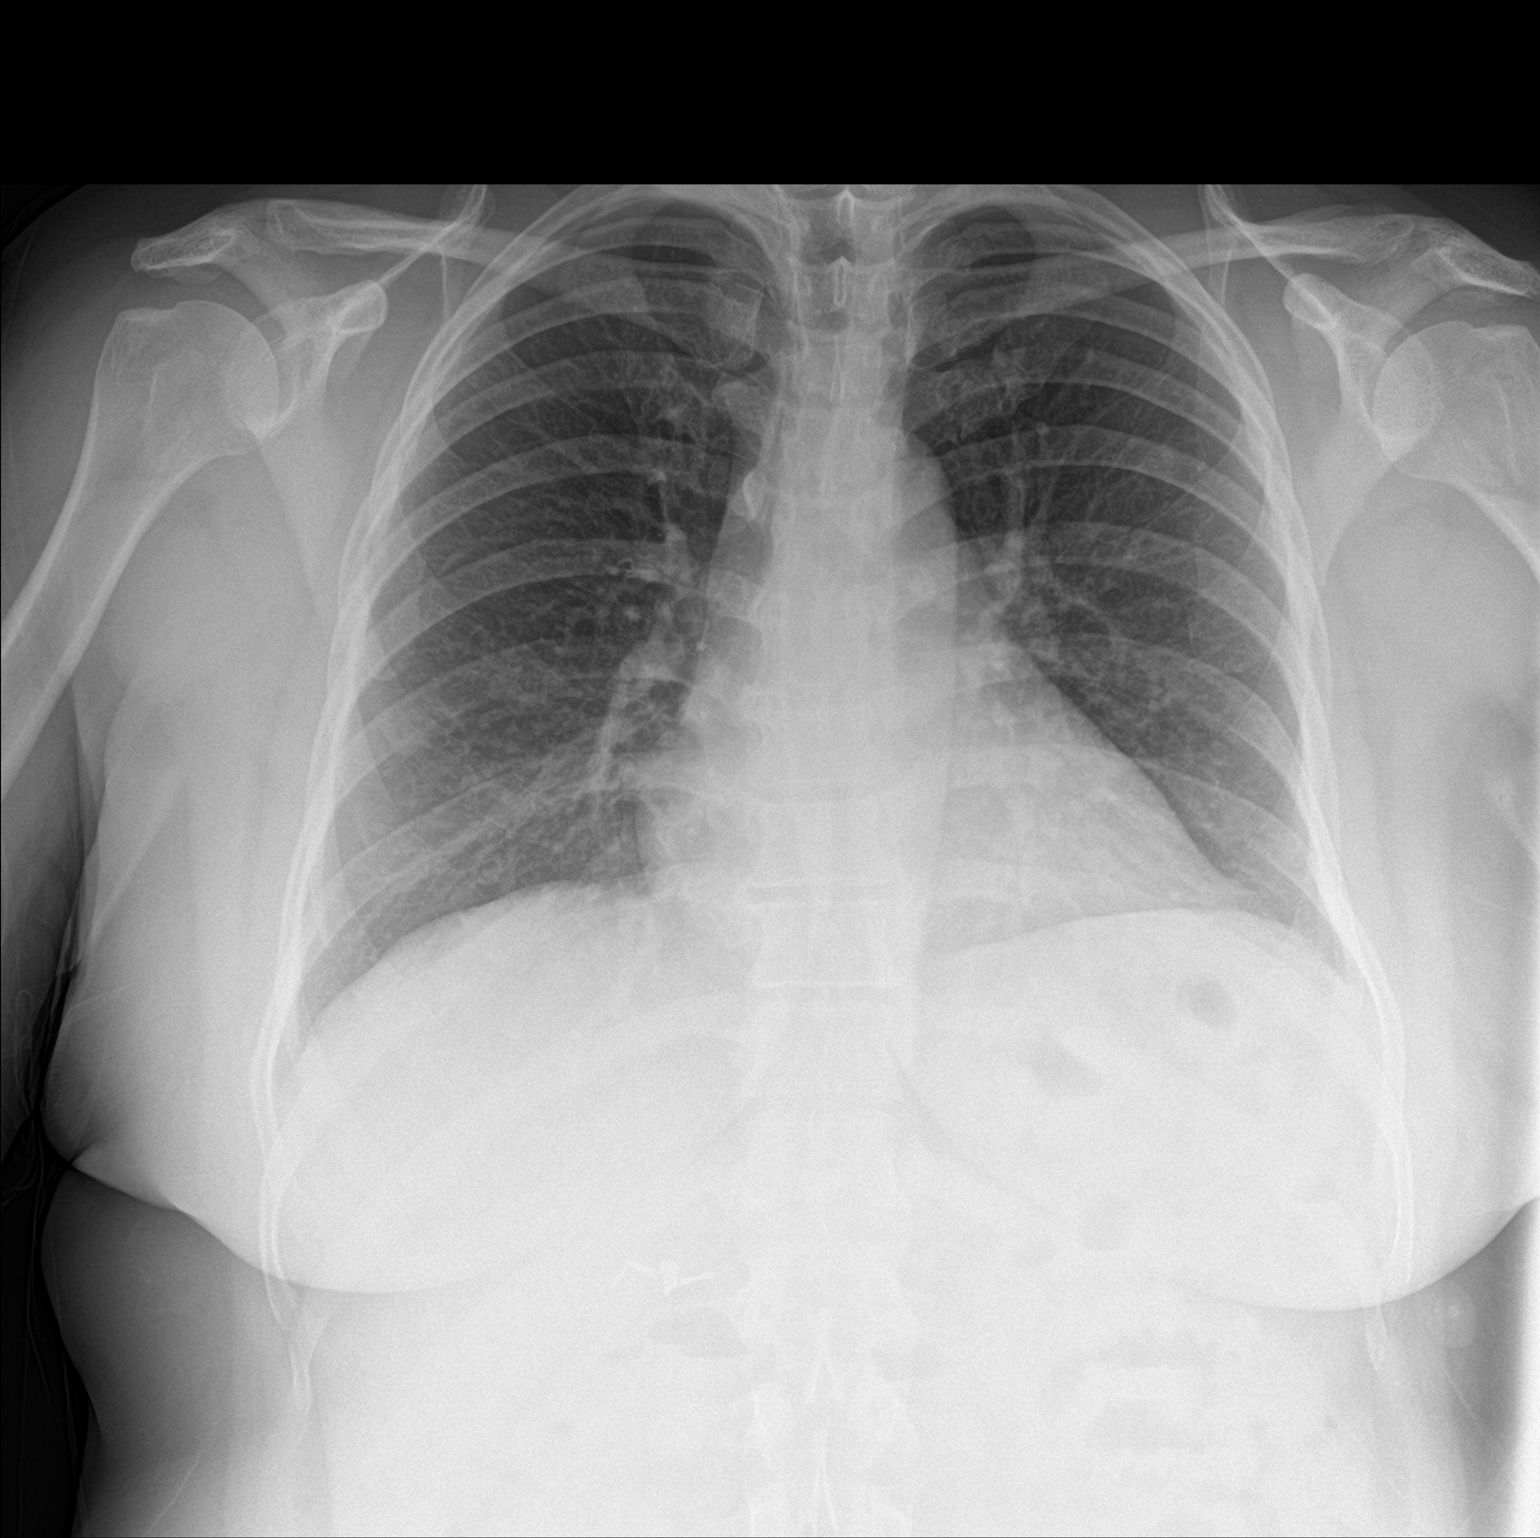

[chest lat]
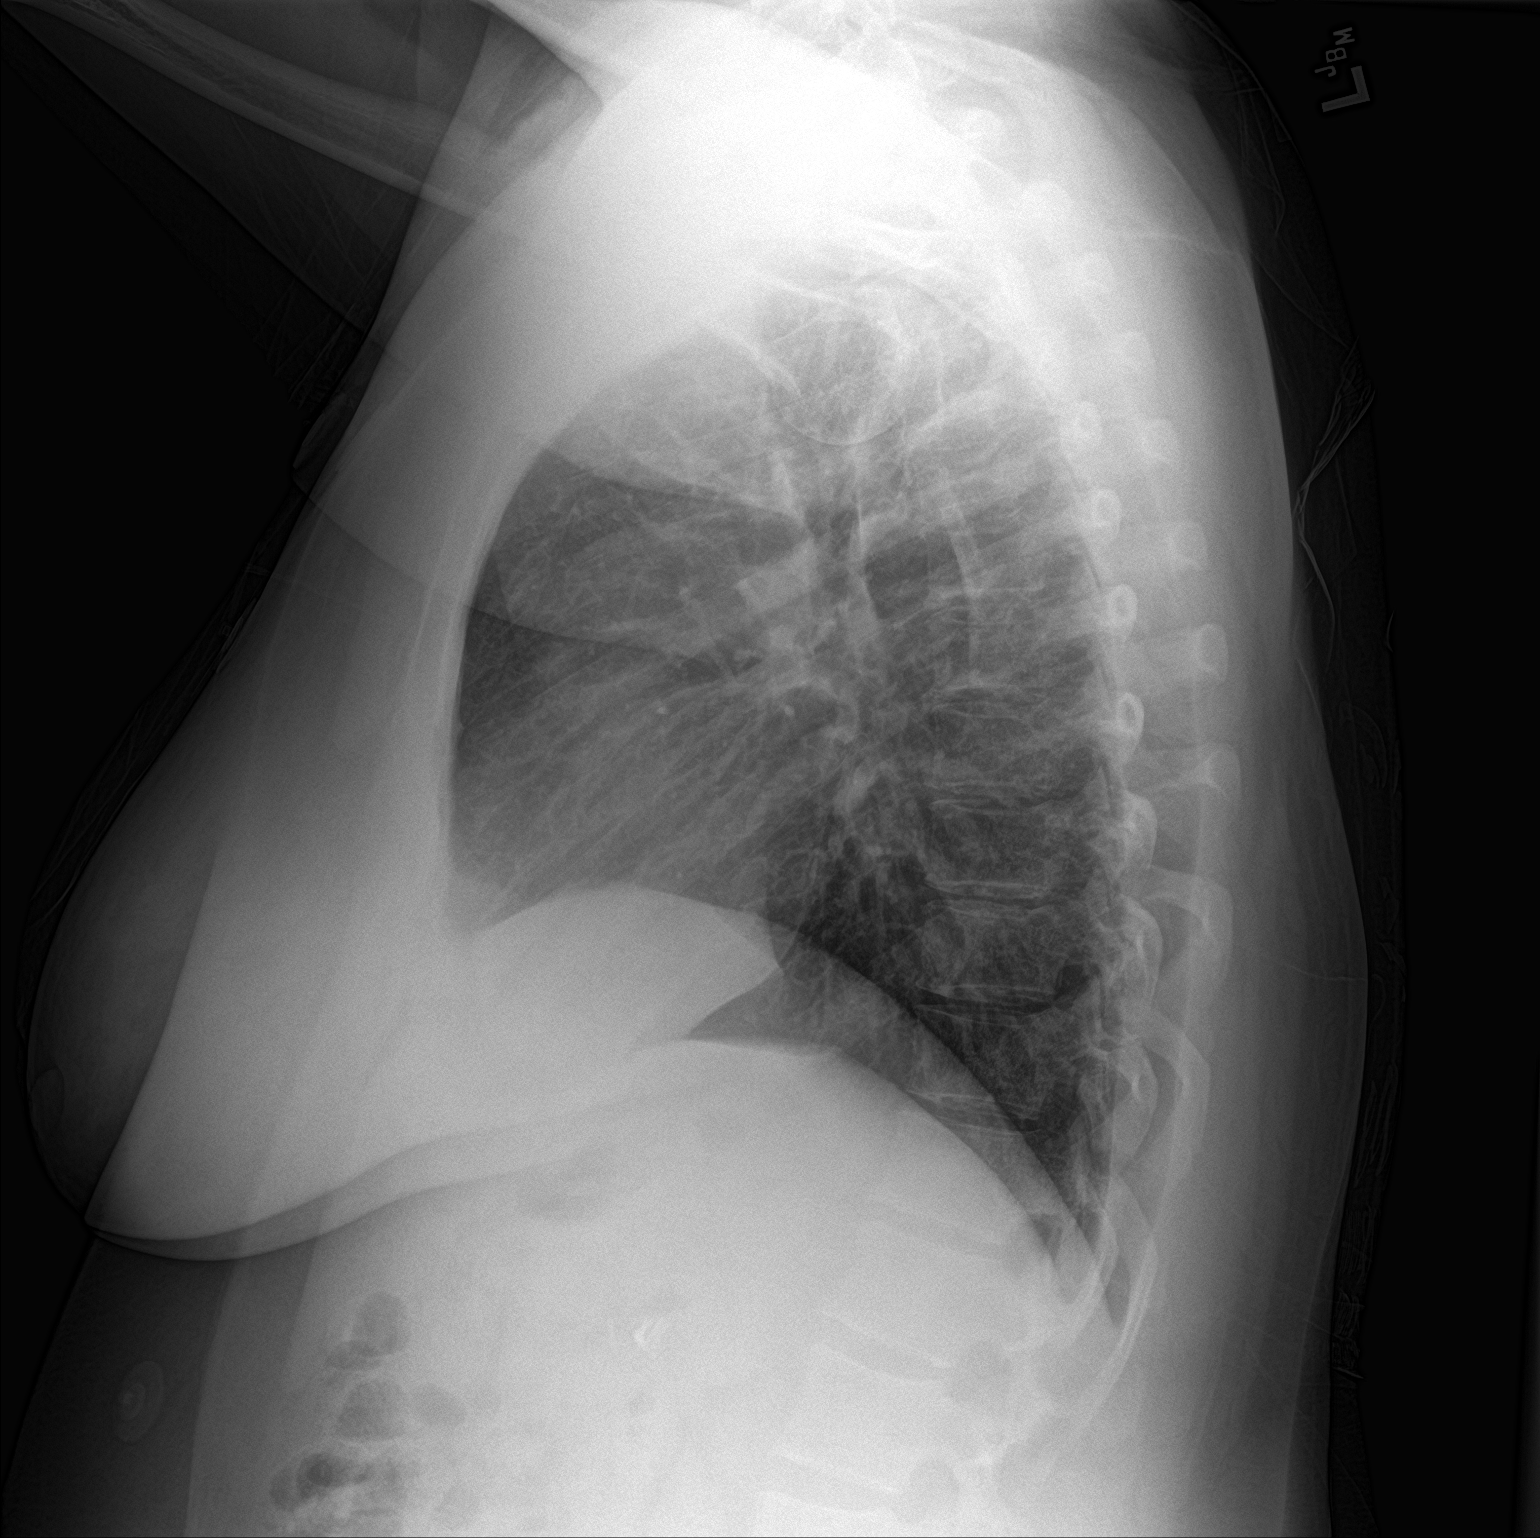

[2 of 2 positions shown; findings below may reference images not displayed]

FINDINGS: The heart size and mediastinal contours are within normal limits.
Both lungs are clear. The visualized skeletal structures are
unremarkable.
IMPRESSION: No active cardiopulmonary disease.

## 2017-03-04 MED ORDER — OMEPRAZOLE 20 MG PO CPDR
20.0000 mg | DELAYED_RELEASE_CAPSULE | Freq: Every day | ORAL | 0 refills | Status: DC
Start: 2017-03-04 — End: 2017-08-25

## 2017-03-04 MED ORDER — GI COCKTAIL ~~LOC~~
30.0000 mL | Freq: Once | ORAL | Status: AC
  Administered 2017-03-04: 30 mL via ORAL
  Filled 2017-03-04: qty 30

## 2017-03-04 NOTE — ED Triage Notes (Signed)
Chest pain and shortness of breath x 4 days. Reports "whole body also hurts". Denies cough or nasal congestion. Reports she tried ibuprofen and not helpful. Worse with movement and deep inspiration.

## 2017-03-04 NOTE — Discharge Instructions (Signed)
Please read attached information. If you experience any new or worsening signs or symptoms please return to the emergency room for evaluation. Please follow-up with your primary care provider or specialist as discussed. Please use medication prescribed only as directed and discontinue taking if you have any concerning signs or symptoms.   °

## 2017-03-04 NOTE — ED Notes (Signed)
Got patient into a gown on the monitor patient is resting with call bell in reach 

## 2017-03-04 NOTE — ED Provider Notes (Signed)
[place] DEPT Provider Note   CSN: [id number] Arrival date & time: 03/04/17  0857     History   Chief Complaint Chief Complaint  Patient presents with  . Chest Pain  . Shortness of Breath    HPI Alexandra Norris is a 41 y.o. female.  HPI   41 year old female presents today with complaints of chest pain.  Patient notes 5 days ago she developed chest pressure, she reports this diffusely to the anterior superior aspect of her chest.  She notes this has been consistent.  She notes she has associated body aches, headache, and fatigue.  She also reports a burning sensation in the chest that is worse with certain foods.  She denies any fever, cough, runny nose or congestion.  Patient notes she occasionally has some shortness of breath, and burning sensation worse with deep inspiration.  She has no sharp pleuritic chest pain.  Patient denies any history of the same, denies any personal or family cardiac history.  She denies any significant risk factors for DVT or PE.  She is not use any drugs or alcohol.  Patient does note some edema in her lower extremities, this is improved with elevation, and unilateral swelling.    Past Medical History:  Diagnosis Date  . Asthma   . History of blood transfusion 1999   w/vaginal delivery  . History of bronchitis    "I've stayed in hospital 2 X w/bronchitis"  . Migraine     Patient Active Problem List   Diagnosis Date Noted  . Pre-diabetes 10/21/2015  . Severe recurrent major depressive disorder with psychotic features ([place])   . Depressive disorder 07/06/2014  . Screening for malignant neoplasm of the cervix 01/03/2013  . GASTROESOPHAGEAL REFLUX DISEASE 08/14/2009  . ETHMOIDAL SINUSITIS, ACUTE 05/14/2009  . DEPRESSION, SITUATIONAL, PROLONGED 02/05/2009  . UNSPECIFIED VISUAL LOSS 11/27/2008  . CHALAZION, LEFT 11/27/2008  . OBESITY 05/30/2007  . IRREGULAR MENSTRUAL CYCLE 05/30/2007  . Asthma 12/06/2006    Past Surgical History:    Procedure Laterality Date  . CESAREAN SECTION  1997; 2008  . CHOLECYSTECTOMY  2011  . TUBAL LIGATION  04/2007    OB History    Gravida Para Term Preterm AB Living   3 3 3     3    SAB TAB Ectopic Multiple Live Births                  Home Medications    Prior to Admission medications   Medication Sig Start Date End Date Taking? Authorizing Provider  ibuprofen (ADVIL,MOTRIN) 200 MG tablet Take 1,000 mg by mouth every 6 (six) hours as needed for moderate pain.     [provider]  omeprazole (PRILOSEC) 20 MG capsule Take 1 capsule (20 mg total) by mouth daily. 03/04/17   Okey Regal, PA-C    Family History Family History  Problem Relation Age of Onset  . Diabetes Mother   . Osteoarthritis Mother   . Hypertension Mother     Social History Social History  Substance Use Topics  . Smoking status: Never Smoker  . Smokeless tobacco: Never Used  . Alcohol use No     Allergies   Patient has no known allergies.   Review of Systems Review of Systems  All other systems reviewed and are negative.  Physical Exam Updated Vital Signs BP 112/65   Pulse 67   Temp 98.3 F (36.8 C) (Oral)   Resp 17   LMP 02/06/2017   SpO2 99%  Physical Exam  Constitutional: She is oriented to person, place, and time. She appears well-developed and well-nourished.  HENT:  Head: Normocephalic and atraumatic.  Eyes: Pupils are equal, round, and reactive to light. Conjunctivae are normal. Right eye exhibits no discharge. Left eye exhibits no discharge. No scleral icterus.  Neck: Normal range of motion. No JVD present. No tracheal deviation present.  Cardiovascular: Normal rate, regular rhythm, normal heart sounds and intact distal pulses.  Exam reveals no gallop and no friction rub.   No murmur heard. Pulmonary/Chest: Effort normal and breath sounds normal. No stridor. No respiratory distress. She has no wheezes. She has no rales. She exhibits no tenderness.  Musculoskeletal:  She exhibits no edema.  Neurological: She is alert and oriented to person, place, and time. Coordination normal.  Skin: Skin is warm. No rash noted.  Psychiatric: She has a normal mood and affect. Her behavior is normal. Judgment and thought content normal.  Nursing note and vitals reviewed.   ED Treatments / Results  Labs (all labs ordered are listed, but only abnormal results are displayed) Labs Reviewed  CBC WITH DIFFERENTIAL/PLATELET - Abnormal; Notable for the following:       Result Value   RDW 16.3 (*)    All other components within normal limits  COMPREHENSIVE METABOLIC PANEL - Abnormal; Notable for the following:    Sodium 134 (*)    CO2 17 (*)    Glucose, Bld 107 (*)    Calcium 8.6 (*)    ALT 13 (*)    All other components within normal limits  I-STAT TROPONIN, ED    EKG  EKG Interpretation None       Radiology Dg Chest 2 View  Result Date: 03/04/2017 CLINICAL DATA:  Chest pain and shortness of breath for 4 days. EXAM: CHEST  2 VIEW COMPARISON:  03/13/2013 FINDINGS: The heart size and mediastinal contours are within normal limits. Both lungs are clear. The visualized skeletal structures are unremarkable. IMPRESSION: No active cardiopulmonary disease. Electronically Signed   By: Kerby Moors M.D.   On: 03/04/2017 09:44    Procedures Procedures (including critical care time)  Medications Ordered in ED Medications  gi cocktail (Maalox,Lidocaine,Donnatal) (30 mLs Oral Given 03/04/17 1229)     Initial Impression / Assessment and Plan / ED Course  I have reviewed the triage vital signs and the nursing notes.  Pertinent labs & imaging results that were available during my care of the patient were reviewed by me and considered in my medical decision making (see chart for details).     Final Clinical Impressions(s) / ED Diagnoses   Final diagnoses:  Gastroesophageal reflux disease, esophagitis presence not specified    Labs: I-STAT troponin, CBC,  CMP  Imaging: Chest 2 view  Consults:  Therapeutics: GI cocktail   Discharge Meds: Prilosec   Assessment/Plan: 41 year old female presents today with complaints of chest discomfort and body aches.  Her symptoms are most consistent with acid reflux.  She was given a GI cocktail here which did not significantly improve her symptoms.  She is well-appearing in no acute distress.  She is afebrile with no signs of infectious etiology.  Patient has had symptoms for several days with reassuring EKG and troponin here in the ED.  Patient has no significant cardiac risk factors, with a very low ACS score, low suspicion for ACS.  Patient also has a very low Wells score is 0, PERC negative very low suspicion for PE, dissection, or any other life-threatening etiology  in this patient.  Patient will need outpatient follow-up with her primary care provider for ongoing evaluation and management of her symptoms.  She will be given strict return precautions any event any new or worsening signs or symptoms present.  Patient verbalized understanding and agreement to today's plan had no further questions or concerns at time discharge.   New Prescriptions New Prescriptions   OMEPRAZOLE (PRILOSEC) 20 MG CAPSULE    Take 1 capsule (20 mg total) by mouth daily.     Okey Regal, PA-C 03/04/17 1245    Carmin Muskrat, MD 03/04/17 1725

## 2017-05-20 ENCOUNTER — Encounter (HOSPITAL_COMMUNITY): Payer: Self-pay | Admitting: Family Medicine

## 2017-05-20 ENCOUNTER — Ambulatory Visit (HOSPITAL_COMMUNITY)
Admission: EM | Admit: 2017-05-20 | Discharge: 2017-05-20 | Disposition: A | Discharge status: Home / Self Care | Payer: Self-pay | Attending: Physician Assistant | Admitting: Physician Assistant

## 2017-05-20 DIAGNOSIS — S39012A Strain of muscle, fascia and tendon of lower back, initial encounter: Secondary | ICD-10-CM

## 2017-05-20 MED ORDER — NAPROXEN 500 MG PO TABS: 500.0000 mg | ORAL_TABLET | Freq: Two times a day (BID) | ORAL | 0 refills | Status: AC

## 2017-05-20 MED ORDER — CYCLOBENZAPRINE HCL 10 MG PO TABS: 5.0000 mg | ORAL_TABLET | Freq: Three times a day (TID) | ORAL | 0 refills | Status: DC | PRN

## 2017-05-20 MED ORDER — TRAMADOL HCL 50 MG PO TABS: 25.0000 mg | ORAL_TABLET | Freq: Two times a day (BID) | ORAL | 0 refills | Status: DC | PRN

## 2017-05-20 NOTE — ED Provider Notes (Signed)
05/20/2017 8:03 PM   DOB: 1975-10-07 / MRN: [id number]  SUBJECTIVE:  Alexandra Norris is a 41 y.o. female presenting for left sided lumbar back pain.  No known injury, dysuria, weakness, numbness.  THis is new for her.  She is a never smoker.   She has No Known Allergies.   She  has a past medical history of Asthma, History of blood transfusion (1999), History of bronchitis, and Migraine.    She  reports that  has never smoked. she has never used smokeless tobacco. She reports that she does not drink alcohol or use drugs. She  reports that she currently engages in sexual activity. She reports using the following method of birth control/protection: None. The patient  has a past surgical history that includes Cholecystectomy (2011); Cesarean section (1997; 2008); and Tubal ligation (04/2007).  Her family history includes Diabetes in her mother; Hypertension in her mother; Osteoarthritis in her mother.  Review of Systems  Constitutional: Negative for chills, diaphoresis and fever.  Gastrointestinal: Negative for abdominal pain, heartburn, nausea and vomiting.  Genitourinary: Negative for flank pain.  Musculoskeletal: Positive for back pain and myalgias.  Skin: Negative for rash.  Neurological: Negative for dizziness.    OBJECTIVE:  BP 114/65   Pulse 65   Temp 98.4 F (36.9 C)   Resp 18   LMP 05/17/2017   SpO2 100%   Physical Exam  Constitutional: She is active.  Non-toxic appearance.  Cardiovascular: Normal rate.  Pulmonary/Chest: Effort normal. No tachypnea.  Abdominal: There is no CVA tenderness.  Musculoskeletal:       Right shoulder: She exhibits tenderness, pain and spasm. She exhibits normal range of motion, no bony tenderness, no swelling, no effusion, no crepitus, no deformity, no laceration, normal pulse and normal strength.       Arms: Neurological: She is alert.  Skin: Skin is warm and dry. She is not diaphoretic. No pallor.    No results found for this or any  previous visit (from the past 72 hour(s)).  No results found.  ASSESSMENT AND PLAN:  The encounter diagnosis was Strain of lumbar region, initial encounter. Uncomplicated.  Will treat symptomatically.     The patient is advised to call or return to clinic if she does not see an improvement in symptoms, or to seek the care of the closest emergency department if she worsens with the above plan.   Philis Fendt, MHS, PA-C 05/20/2017 8:03 PM    Tereasa Coop, PA-C 05/20/17 2004

## 2017-05-20 NOTE — ED Triage Notes (Signed)
Pt here for pain in left lower back with radiation into groin at times. Since last Sunday. Denies fever, numbness, tingling, weakness. Denies injury, heavy lifting.

## 2017-05-20 NOTE — Discharge Instructions (Signed)
Follow up with your PCP if this pain goes much longer than three weeks.

## 2017-06-14 ENCOUNTER — Emergency Department (HOSPITAL_COMMUNITY)
Admission: EM | Admit: 2017-06-14 | Discharge: 2017-06-14 | Disposition: A | Discharge status: Home / Self Care | Payer: Self-pay | Attending: Emergency Medicine | Admitting: Emergency Medicine

## 2017-06-14 ENCOUNTER — Encounter (HOSPITAL_COMMUNITY): Payer: Self-pay | Admitting: Emergency Medicine

## 2017-06-14 DIAGNOSIS — Z79899 Other long term (current) drug therapy: Secondary | ICD-10-CM | POA: Insufficient documentation

## 2017-06-14 DIAGNOSIS — H00012 Hordeolum externum right lower eyelid: Secondary | ICD-10-CM | POA: Insufficient documentation

## 2017-06-14 DIAGNOSIS — J45909 Unspecified asthma, uncomplicated: Secondary | ICD-10-CM | POA: Insufficient documentation

## 2017-06-14 MED ORDER — POLYMYXIN B-TRIMETHOPRIM 10000-0.1 UNIT/ML-% OP SOLN
1.0000 [drp] | Freq: Once | OPHTHALMIC | Status: AC
  Administered 2017-06-14: 1 [drp] via OPHTHALMIC
  Filled 2017-06-14: qty 10

## 2017-06-14 MED ORDER — TETRACAINE HCL 0.5 % OP SOLN
2.0000 [drp] | Freq: Once | OPHTHALMIC | Status: AC
Start: 2017-06-14 — End: 2017-06-14
  Administered 2017-06-14: 2 [drp] via OPHTHALMIC
  Filled 2017-06-14: qty 4

## 2017-06-14 MED ORDER — FLUORESCEIN SODIUM 1 MG OP STRP
1.0000 | ORAL_STRIP | Freq: Once | OPHTHALMIC | Status: AC
  Administered 2017-06-14: 1 via OPHTHALMIC
  Filled 2017-06-14: qty 1

## 2017-06-14 MED ORDER — LIDOCAINE-EPINEPHRINE-TETRACAINE (LET) SOLUTION
3.0000 mL | Freq: Once | NASAL | Status: DC
  Filled 2017-06-14: qty 3

## 2017-06-14 NOTE — ED Provider Notes (Signed)
[facility] DEPT Provider Note   CSN: [id number] Arrival date & time: 06/14/17  1041     History   Chief Complaint Chief Complaint  Patient presents with  . Eye Pain    HPI   Blood pressure 134/84, pulse 73, temperature 98.6 F (37 C), temperature source Oral, resp. rate 16, last menstrual period 05/17/2017, SpO2 100 %.  Alexandra Norris is a 42 y.o. female complaining of right eye irritation worsening over the course of the week associated with lower lids applying warm compresses 3-4 times a day, she is not a contact lens wearer.  There is been no trauma, no change in her vision no discharge or erythema to the eye.  Past Medical History:  Diagnosis Date  . Asthma   . History of blood transfusion 1999   w/vaginal delivery  . History of bronchitis    "I've stayed in hospital 2 X w/bronchitis"  . Migraine     Patient Active Problem List   Diagnosis Date Noted  . Pre-diabetes 10/21/2015  . Severe recurrent major depressive disorder with psychotic features ([place])   . Depressive disorder 07/06/2014  . Screening for malignant neoplasm of the cervix 01/03/2013  . GASTROESOPHAGEAL REFLUX DISEASE 08/14/2009  . ETHMOIDAL SINUSITIS, ACUTE 05/14/2009  . DEPRESSION, SITUATIONAL, PROLONGED 02/05/2009  . UNSPECIFIED VISUAL LOSS 11/27/2008  . CHALAZION, LEFT 11/27/2008  . OBESITY 05/30/2007  . IRREGULAR MENSTRUAL CYCLE 05/30/2007  . Asthma 12/06/2006    Past Surgical History:  Procedure Laterality Date  . CESAREAN SECTION  1997; 2008  . CHOLECYSTECTOMY  2011  . TUBAL LIGATION  04/2007    OB History    Gravida Para Term Preterm AB Living   3 3 3     3    SAB TAB Ectopic Multiple Live Births                   Home Medications    Prior to Admission medications   Medication Sig Start Date End Date Taking? Authorizing Provider  cyclobenzaprine (FLEXERIL) 10 MG tablet Take 0.5-1 tablets (5-10 mg total) by mouth 3 (three) times daily as  needed for muscle spasms. Do not mix with narcotics. May cause drowsiness. Patient taking differently: Take 5-10 mg by mouth 2 (two) times daily. Do not mix with narcotics. May cause drowsiness. 05/20/17  Yes Tereasa Coop, PA-C  ibuprofen (ADVIL,MOTRIN) 200 MG tablet Take 200-600 mg by mouth every 8 (eight) hours as needed for mild pain.   Yes [provider]  omeprazole (PRILOSEC) 20 MG capsule Take 1 capsule (20 mg total) by mouth daily. 03/04/17   Hedges, Dellis Filbert, PA-C  traMADol (ULTRAM) 50 MG tablet Take 0.5-1 tablets (25-50 mg total) by mouth every 12 (twelve) hours as needed for severe pain. For sever pain only. 05/20/17   Tereasa Coop, PA-C    Family History Family History  Problem Relation Age of Onset  . Diabetes Mother   . Osteoarthritis Mother   . Hypertension Mother     Social History Social History   Tobacco Use  . Smoking status: Never Smoker  . Smokeless tobacco: Never Used  Substance Use Topics  . Alcohol use: No  . Drug use: No     Allergies   Patient has no known allergies.   Review of Systems Review of Systems  A complete review of systems was obtained and all systems are negative except as noted in the HPI and PMH.   Physical Exam Updated Vital Signs  BP 134/84 (BP Location: Right Arm)   Pulse 73   Temp 98.6 F (37 C) (Oral)   Resp 16   LMP 05/17/2017   SpO2 100%   Physical Exam  Constitutional: She is oriented to person, place, and time. She appears well-developed and well-nourished. No distress.  HENT:  Head: Normocephalic and atraumatic.  Mouth/Throat: Oropharynx is clear and moist.  Eyes: Conjunctivae and EOM are normal. Pupils are equal, round, and reactive to light.  Stye to right lower lid with pointing on the anterior aspect.  No conjunctival injection, discharge.  Pupils equal round and reactive to light. No abnormal uptake on fluorescein stain  Neck: Normal range of motion.  Cardiovascular: Normal rate, regular rhythm  and intact distal pulses.  Pulmonary/Chest: Effort normal and breath sounds normal.  Abdominal: Soft. There is no tenderness.  Musculoskeletal: Normal range of motion.  Neurological: She is alert and oriented to person, place, and time.  Skin: She is not diaphoretic.  Psychiatric: She has a normal mood and affect.  Nursing note and vitals reviewed.    ED Treatments / Results  Labs (all labs ordered are listed, but only abnormal results are displayed) Labs Reviewed - No data to display  EKG  EKG Interpretation None       Radiology No results found.  Procedures Procedures (including critical care time)  Medications Ordered in ED Medications  fluorescein ophthalmic strip 1 strip (not administered)  trimethoprim-polymyxin b (POLYTRIM) ophthalmic solution 1 drop (not administered)  tetracaine (PONTOCAINE) 0.5 % ophthalmic solution 2 drop (not administered)     Initial Impression / Assessment and Plan / ED Course  I have reviewed the triage vital signs and the nursing notes.  Pertinent labs & imaging results that were available during my care of the patient were reviewed by me and considered in my medical decision making (see chart for details).     Vitals:   06/14/17 1135 06/14/17 1313  BP: 134/84 129/77  Pulse: 73 71  Resp: 16 18  Temp: 98.6 F (37 C)   TempSrc: Oral   SpO2: 100% 99%    Medications  fluorescein ophthalmic strip 1 strip (1 strip Both Eyes Given 06/14/17 1259)  trimethoprim-polymyxin b (POLYTRIM) ophthalmic solution 1 drop (1 drop Right Eye Given 06/14/17 1259)  tetracaine (PONTOCAINE) 0.5 % ophthalmic solution 2 drop (2 drops Right Eye Given 06/14/17 1259)    Alexandra Norris is 42 y.o. female presenting with stye to right eye, no abnormal uptake on fluorescein stain.  This patient is not a contact lens wear.  Encouraged warm compresses, will give bottle of Polytrim in the ED, ophthalmologic referral given.  Evaluation does not show pathology  that would require ongoing emergent intervention or inpatient treatment. Pt is hemodynamically stable and mentating appropriately. Discussed findings and plan with patient/guardian, who agrees with care plan. All questions answered. Return precautions discussed and outpatient follow up given.      Final Clinical Impressions(s) / ED Diagnoses   Final diagnoses:  Hordeolum externum of right lower eyelid    ED Discharge Orders    None       Alexandra Norris 06/14/17 [place], MD 06/15/17 [phone]

## 2017-06-14 NOTE — Discharge Instructions (Signed)
Apply warm compresses to the eye 4-8 times a day.  Please check in with the eye doctor or return to the emergency room for any new, concerning or worsening symptoms.

## 2017-06-14 NOTE — ED Triage Notes (Signed)
Patient presents with right eye irritation for past week and a half. Pt looks like small sty to eyelid.

## 2017-08-15 ENCOUNTER — Emergency Department (HOSPITAL_COMMUNITY): Payer: Self-pay

## 2017-08-15 ENCOUNTER — Emergency Department (HOSPITAL_COMMUNITY)
Admission: EM | Admit: 2017-08-15 | Discharge: 2017-08-15 | Disposition: A | Discharge status: Home / Self Care | Payer: Self-pay | Attending: Emergency Medicine | Admitting: Emergency Medicine

## 2017-08-15 ENCOUNTER — Encounter (HOSPITAL_COMMUNITY): Payer: Self-pay

## 2017-08-15 DIAGNOSIS — J40 Bronchitis, not specified as acute or chronic: Secondary | ICD-10-CM | POA: Insufficient documentation

## 2017-08-15 DIAGNOSIS — Z79899 Other long term (current) drug therapy: Secondary | ICD-10-CM | POA: Insufficient documentation

## 2017-08-15 LAB — CBC WITH DIFFERENTIAL/PLATELET
Basophils Absolute: 0 10*3/uL (ref 0.0–0.1)
Basophils Relative: 0 %
EOS ABS: 0.3 10*3/uL (ref 0.0–0.7)
Eosinophils Relative: 4 %
HCT: 40 % (ref 36.0–46.0)
HEMOGLOBIN: 12.9 g/dL (ref 12.0–15.0)
LYMPHS ABS: 1 10*3/uL (ref 0.7–4.0)
LYMPHS PCT: 12 %
MCH: 27.4 pg (ref 26.0–34.0)
MCHC: 32.3 g/dL (ref 30.0–36.0)
MCV: 84.9 fL (ref 78.0–100.0)
Monocytes Absolute: 0.8 10*3/uL (ref 0.1–1.0)
Monocytes Relative: 9 %
NEUTROS PCT: 75 %
Neutro Abs: 6.6 10*3/uL (ref 1.7–7.7)
Platelets: 274 10*3/uL (ref 150–400)
RBC: 4.71 MIL/uL (ref 3.87–5.11)
RDW: 13.4 % (ref 11.5–15.5)
WBC: 8.8 10*3/uL (ref 4.0–10.5)

## 2017-08-15 LAB — COMPREHENSIVE METABOLIC PANEL
ALK PHOS: 101 U/L (ref 38–126)
ALT: 21 U/L (ref 14–54)
ANION GAP: 10 (ref 5–15)
AST: 26 U/L (ref 15–41)
Albumin: 3.9 g/dL (ref 3.5–5.0)
BILIRUBIN TOTAL: 0.5 mg/dL (ref 0.3–1.2)
BUN: 6 mg/dL (ref 6–20)
CALCIUM: 8.8 mg/dL — AB (ref 8.9–10.3)
CO2: 22 mmol/L (ref 22–32)
CREATININE: 0.51 mg/dL (ref 0.44–1.00)
Chloride: 104 mmol/L (ref 101–111)
Glucose, Bld: 114 mg/dL — ABNORMAL HIGH (ref 65–99)
Potassium: 3.8 mmol/L (ref 3.5–5.1)
SODIUM: 136 mmol/L (ref 135–145)
TOTAL PROTEIN: 7.2 g/dL (ref 6.5–8.1)

## 2017-08-15 LAB — D-DIMER, QUANTITATIVE: D-Dimer, Quant: 0.58 ug/mL-FEU — ABNORMAL HIGH (ref 0.00–0.50)

## 2017-08-15 LAB — I-STAT BETA HCG BLOOD, ED (MC, WL, AP ONLY)

## 2017-08-15 LAB — I-STAT TROPONIN, ED: Troponin i, poc: 0 ng/mL (ref 0.00–0.08)

## 2017-08-15 IMAGING — CR DG CHEST 2V
2 series · 2 of 2 positions shown · non-contrast
Comparison: Chest x-ray dated [DATE]

CLINICAL DATA: Patient complains of 5 days of cough with
congestion, SOB, chest pain, body aches, fevers and chills. States
that the cough is worse at night.

EXAM:
CHEST - 2 VIEW

[chest pa]
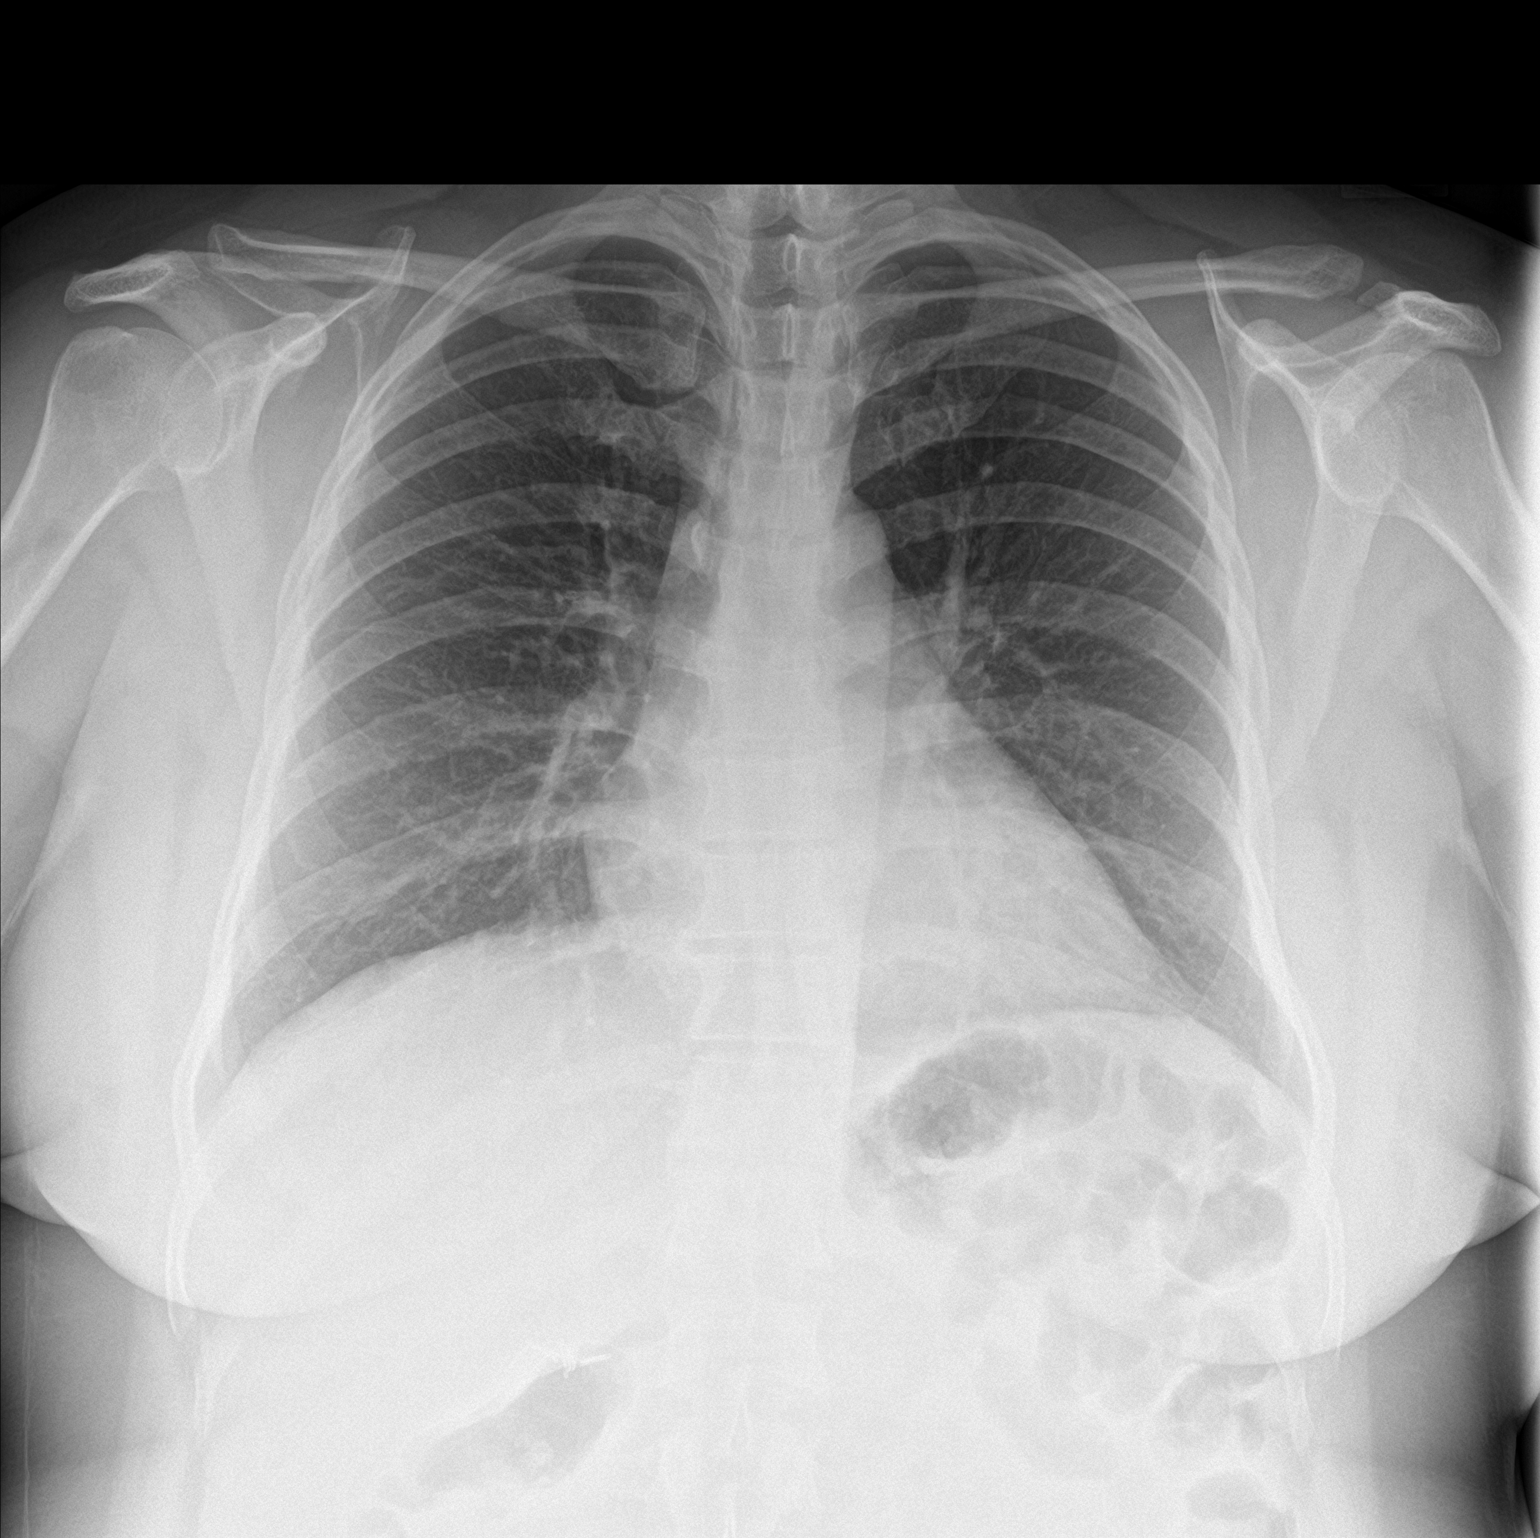

[chest lat]
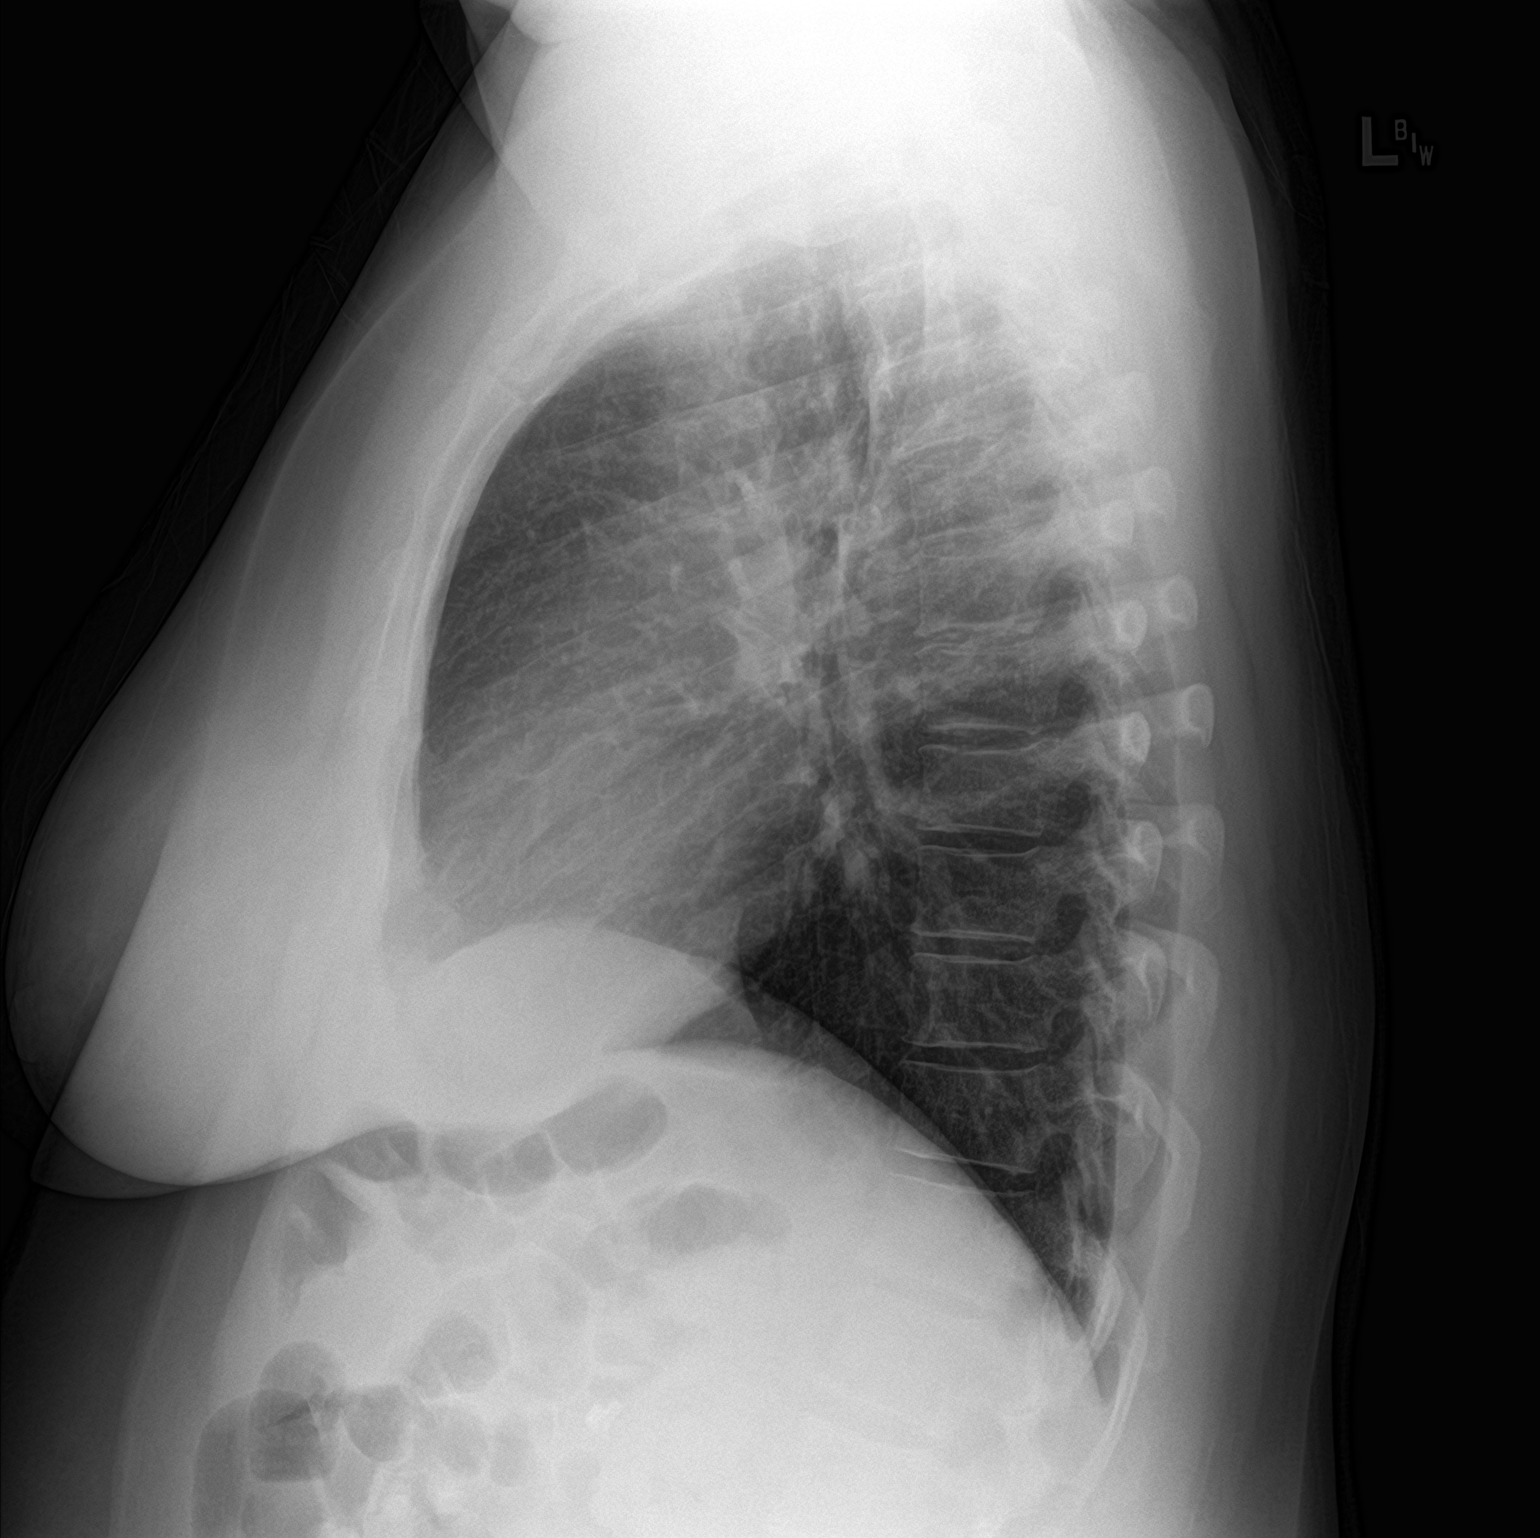

[2 of 2 positions shown; findings below may reference images not displayed]

FINDINGS: Heart size and mediastinal contours are normal. Lungs are clear. No
pleural effusion or pneumothorax seen. Osseous structures about the
chest are unremarkable.
IMPRESSION: No active cardiopulmonary disease. No evidence of pneumonia or
pulmonary edema.

## 2017-08-15 MED ORDER — KETOROLAC TROMETHAMINE 30 MG/ML IJ SOLN
30.0000 mg | Freq: Once | INTRAMUSCULAR | Status: AC
  Administered 2017-08-15: 30 mg via INTRAVENOUS
  Filled 2017-08-15: qty 1

## 2017-08-15 MED ORDER — PREDNISONE 10 MG (21) PO TBPK: ORAL_TABLET | ORAL | 0 refills | Status: DC

## 2017-08-15 MED ORDER — SODIUM CHLORIDE 0.9 % IV BOLUS (SEPSIS)
1000.0000 mL | Freq: Once | INTRAVENOUS | Status: AC
  Administered 2017-08-15: 1000 mL via INTRAVENOUS

## 2017-08-15 MED ORDER — BENZONATATE 100 MG PO CAPS: 100.0000 mg | ORAL_CAPSULE | Freq: Three times a day (TID) | ORAL | 0 refills | Status: DC

## 2017-08-15 MED ORDER — ALBUTEROL SULFATE HFA 108 (90 BASE) MCG/ACT IN AERS: 2.0000 | INHALATION_SPRAY | RESPIRATORY_TRACT | 0 refills | Status: DC | PRN

## 2017-08-15 MED ORDER — IOPAMIDOL (ISOVUE-370) INJECTION 76%
INTRAVENOUS | Status: AC
  Administered 2017-08-15: 100 mL
  Filled 2017-08-15: qty 100

## 2017-08-15 MED ORDER — AZITHROMYCIN 250 MG PO TABS: 250.0000 mg | ORAL_TABLET | Freq: Every day | ORAL | 0 refills | Status: DC

## 2017-08-15 MED ORDER — METHYLPREDNISOLONE SODIUM SUCC 125 MG IJ SOLR
125.0000 mg | Freq: Once | INTRAMUSCULAR | Status: AC
  Administered 2017-08-15: 125 mg via INTRAVENOUS
  Filled 2017-08-15: qty 2

## 2017-08-15 NOTE — ED Triage Notes (Signed)
Patient complains of 5 days of cough with congestion and chills. States that the cough is worse at night

## 2017-08-15 NOTE — Discharge Instructions (Addendum)
There were no signs of a blood clot in the lungs, but there were signs of bronchitis.  Your symptoms are consistent with a viral illness. Viruses do not require antibiotics, however, due to your history of asthma and your worsening symptoms, we will do a course of antibiotics. Treatment is symptomatic care and it is important to note that these symptoms may last for 7-14 days, sometimes longer, especially if it is a viral illness.   Hand washing: Wash your hands throughout the day, but especially before and after touching the face, using the restroom, sneezing, coughing, or touching surfaces that have been coughed or sneezed upon. Hydration: Symptoms will be intensified and complicated by dehydration. Dehydration can also extend the duration of symptoms. Drink plenty of fluids and get plenty of rest. You should be drinking at least half a liter of water an hour to stay hydrated. Electrolyte drinks (ex. Gatorade, Powerade, Pedialyte) are also encouraged. You should be drinking enough fluids to make your urine light yellow, almost clear. If this is not the case, you are not drinking enough water. Please note that some of the treatments indicated below will not be effective if you are not adequately hydrated. Pain or fever: Ibuprofen, Naproxen, or Tylenol for pain or fever.  Cough: Use the Tessalon for cough.  Albuterol: May use the albuterol as needed for instances of shortness of breath. Prednisone: Take the prednisone, as directed, in its entirety. Zyrtec or Claritin: May add these medication daily to control underlying symptoms of congestion, sneezing, and other signs of allergies. Flonase: Use this medication, as directed, for nasal and sinus congestion. Congestion: Plain Mucinex may help relieve congestion. Saline sinus rinses and saline nasal sprays may also help relieve congestion. If you do not have heart problems or an allergy to such medications, you may also try phenylephrine or Sudafed. Sore  throat: Warm liquids or Chloraseptic spray may help soothe a sore throat. Gargle twice a day with a salt water solution made from a half teaspoon of salt in a cup of warm water.  Follow up: Follow up with a primary care provider, as needed, for any future management of this issue.

## 2017-08-15 NOTE — ED Provider Notes (Signed)
[facility] Provider Note   CSN: [id number] Arrival date & time: 08/15/17  1105     History   Chief Complaint Chief Complaint  Patient presents with  . cough/congestion    HPI Heavyn Norris is a 42 y.o. female.  HPI   Alexandra Norris is a 42 y.o. female, with a history of asthma and bronchitis, presenting to the ED with productive cough for last 5 days.  Endorses blood-tinged sputum.  Patient states symptoms began with sudden onset of shortness of breath.  Cough is worse at night.  Accompanied by body aches and pain to the chest with coughing and deep breathing. She has not had to use her albuterol inhaler in over a year.  LMP August 08, 2017.  Denies sick contacts with similar symptoms. Denies fever/chills, N/V/D, abdominal pain, urinary symptoms, nasal congestion, sore throat, or any other complaints. Denies history of PE/DVT, recent immobilization or travel, recent surgery, recent trauma, hormone use.   Past Medical History:  Diagnosis Date  . Asthma   . History of blood transfusion 1999   w/vaginal delivery  . History of bronchitis    "I've stayed in hospital 2 X w/bronchitis"  . Migraine     Patient Active Problem List   Diagnosis Date Noted  . Pre-diabetes 10/21/2015  . Severe recurrent major depressive disorder with psychotic features ([place])   . Depressive disorder 07/06/2014  . Screening for malignant neoplasm of the cervix 01/03/2013  . GASTROESOPHAGEAL REFLUX DISEASE 08/14/2009  . ETHMOIDAL SINUSITIS, ACUTE 05/14/2009  . DEPRESSION, SITUATIONAL, PROLONGED 02/05/2009  . UNSPECIFIED VISUAL LOSS 11/27/2008  . CHALAZION, LEFT 11/27/2008  . OBESITY 05/30/2007  . IRREGULAR MENSTRUAL CYCLE 05/30/2007  . Asthma 12/06/2006    Past Surgical History:  Procedure Laterality Date  . CESAREAN SECTION  1997; 2008  . CHOLECYSTECTOMY  2011  . TUBAL LIGATION  04/2007    OB History    Gravida Para Term Preterm AB Living     3 3 3     3    SAB TAB Ectopic Multiple Live Births                   Home Medications    Prior to Admission medications   Medication Sig Start Date End Date Taking? Authorizing Provider  albuterol (PROVENTIL HFA;VENTOLIN HFA) 108 (90 Base) MCG/ACT inhaler Inhale 2 puffs into the lungs every 4 (four) hours as needed for wheezing or shortness of breath. 08/15/17   Alexandra Norris C, PA-C  azithromycin (ZITHROMAX) 250 MG tablet Take 1 tablet (250 mg total) by mouth daily. Take first 2 tablets together, then 1 every day until finished. 08/15/17   Alexandra [facility] C, PA-C  benzonatate (TESSALON) 100 MG capsule Take 1 capsule (100 mg total) by mouth every 8 (eight) hours. 08/15/17   Alexandra Norris C, PA-C  cyclobenzaprine (FLEXERIL) 10 MG tablet Take 0.5-1 tablets (5-10 mg total) by mouth 3 (three) times daily as needed for muscle spasms. Do not mix with narcotics. May cause drowsiness. Patient taking differently: Take 5-10 mg by mouth 2 (two) times daily. Do not mix with narcotics. May cause drowsiness. 05/20/17   Tereasa Coop, PA-C  ibuprofen (ADVIL,MOTRIN) 200 MG tablet Take 200-600 mg by mouth every 8 (eight) hours as needed for mild pain.    [provider]  omeprazole (PRILOSEC) 20 MG capsule Take 1 capsule (20 mg total) by mouth daily. Patient not taking: Reported on 06/15/2017 03/04/17   Okey Regal, PA-C  predniSONE (STERAPRED UNI-PAK 21 TAB) 10 MG (21) TBPK tablet Take 6 tabs (60mg ) on day 1, 5 tabs (50mg ) on day 2, 4 tabs (40mg ) on day 3, 3 tabs (30mg ) on day 4, 2 tabs (20mg ) on day 5, and 1 tab (10mg ) on day 6. 08/15/17   Alexandra [facility] C, PA-C  traMADol (ULTRAM) 50 MG tablet Take 0.5-1 tablets (25-50 mg total) by mouth every 12 (twelve) hours as needed for severe pain. For sever pain only. Patient not taking: Reported on 06/15/2017 05/20/17   Tereasa Coop, PA-C    Family History Family History  Problem Relation Age of Onset  . Diabetes Mother   . Osteoarthritis Mother   . Hypertension  Mother     Social History Social History   Tobacco Use  . Smoking status: Never Smoker  . Smokeless tobacco: Never Used  Substance Use Topics  . Alcohol use: No  . Drug use: No     Allergies   Patient has no known allergies.   Review of Systems Review of Systems  Constitutional: Negative for chills, diaphoresis and fever.  HENT: Negative for congestion and sore throat.   Respiratory: Positive for cough and shortness of breath.   Gastrointestinal: Negative for abdominal pain, diarrhea, nausea and vomiting.  Genitourinary: Negative for dysuria, frequency and hematuria.  Musculoskeletal: Negative for back pain.       Chest pain with coughing and deep breathing  Neurological: Negative for dizziness, light-headedness and headaches.  All other systems reviewed and are negative.    Physical Exam Updated Vital Signs BP 115/80   Pulse (!) 104   Temp 99.7 F (37.6 C) (Oral)   LMP 08/08/2017 (Approximate)   SpO2 99%   Physical Exam  Constitutional: She appears well-developed and well-nourished. No distress.  HENT:  Head: Normocephalic and atraumatic.  Mouth/Throat: Oropharynx is clear and moist.  Eyes: Conjunctivae are normal.  Neck: Neck supple.  Cardiovascular: Regular rhythm, normal heart sounds and intact distal pulses.  Patient mildly tachycardic at 106 bpm.  Pulmonary/Chest: Effort normal and breath sounds normal. No respiratory distress.  No increased work of breathing.  Patient speaks in full sentences without difficulty.  Abdominal: Soft. There is no tenderness. There is no guarding.  Musculoskeletal: She exhibits no edema.  Lymphadenopathy:    She has no cervical adenopathy.  Neurological: She is alert.  Skin: Skin is warm and dry. Capillary refill takes less than 2 seconds. She is not diaphoretic.  Psychiatric: She has a normal mood and affect. Her behavior is normal.  Nursing note and vitals reviewed.    ED Treatments / Results  Labs (all labs  ordered are listed, but only abnormal results are displayed) Labs Reviewed  D-DIMER, QUANTITATIVE (NOT AT [facility]) - Abnormal; Notable for the following components:      Result Value   D-Dimer, Quant 0.58 (*)    All other components within normal limits  COMPREHENSIVE METABOLIC PANEL - Abnormal; Notable for the following components:   Glucose, Bld 114 (*)    Calcium 8.8 (*)    All other components within normal limits  CBC WITH DIFFERENTIAL/PLATELET  I-STAT BETA HCG BLOOD, ED ([facility], [facility], [facility] ONLY)  I-STAT TROPONIN, ED    EKG  EKG Interpretation  Date/Time:  Sunday August 15 2017 13:31:47 EDT Ventricular Rate:  100 PR Interval:    QRS Duration: 85 QT Interval:  335 QTC Calculation: 432 R Axis:   90 Text Interpretation:  Sinus tachycardia Borderline right axis deviation No significant  change was found Confirmed by Jola Schmidt [phone]) on 08/15/2017 1:52:26 PM       Radiology Dg Chest 2 View  Result Date: 08/15/2017 CLINICAL DATA:  Patient complains of 5 days of cough with congestion, SOB, chest pain, body aches, fevers and chills. States that the cough is worse at night. EXAM: CHEST - 2 VIEW COMPARISON:  Chest x-ray dated 03/04/2017 FINDINGS: Heart size and mediastinal contours are normal. Lungs are clear. No pleural effusion or pneumothorax seen. Osseous structures about the chest are unremarkable. IMPRESSION: No active cardiopulmonary disease. No evidence of pneumonia or pulmonary edema. Electronically Signed   By: Franki Cabot M.D.   On: 08/15/2017 12:10   Ct Angio Chest Pe W And/or Wo Contrast  Result Date: 08/15/2017 CLINICAL DATA:  Cough with congestion and chills. Evaluate for pulmonary embolism. EXAM: CT ANGIOGRAPHY CHEST WITH CONTRAST TECHNIQUE: Multidetector CT imaging of the chest was performed using the standard protocol during bolus administration of intravenous contrast. Multiplanar CT image reconstructions and MIPs were obtained to evaluate the vascular anatomy. CONTRAST:   129mL ISOVUE-370 IOPAMIDOL (ISOVUE-370) INJECTION 76% COMPARISON:  Chest x-ray from same day. FINDINGS: Cardiovascular: Satisfactory opacification of the pulmonary arteries to the segmental level. No evidence of pulmonary embolism. Normal heart size. No pericardial effusion. Normal caliber thoracic aorta. Common origin of the right brachiocephalic and left common carotid artery origins. No aortic dissection. Mediastinum/Nodes: No enlarged mediastinal, hilar, or axillary lymph nodes. Thyroid gland, trachea, and esophagus demonstrate no significant findings. Lungs/Pleura: Mild central peribronchial thickening, greater on the left. No focal consolidation, pleural effusion, or pneumothorax. No suspicious pulmonary nodule. Upper Abdomen: No acute abnormality.  Prior cholecystectomy. Musculoskeletal: No chest wall abnormality. No acute or significant osseous findings. Degenerative changes of the lower cervical spine. Review of the MIP images confirms the above findings. IMPRESSION: 1. No evidence of pulmonary embolism. 2. Mild central peribronchial thickening as can be seen with acute bronchitis. No airspace disease or consolidation. Electronically Signed   By: Titus Dubin M.D.   On: 08/15/2017 15:33    Procedures Procedures (including critical care time)  Medications Ordered in ED Medications  ketorolac (TORADOL) 30 MG/ML injection 30 mg (30 mg Intravenous Given 08/15/17 1324)  sodium chloride 0.9 % bolus 1,000 mL (1,000 mLs Intravenous New Bag/Given 08/15/17 1326)  methylPREDNISolone sodium succinate (SOLU-MEDROL) 125 mg/2 mL injection 125 mg (125 mg Intravenous Given 08/15/17 1324)  iopamidol (ISOVUE-370) 76 % injection (100 mLs  Contrast Given 08/15/17 1510)     Initial Impression / Assessment and Plan / ED Course  I have reviewed the triage vital signs and the nursing notes.  Pertinent labs & imaging results that were available during my care of the patient were reviewed by me and considered in my  medical decision making (see chart for details).     Patient presents with shortness of breath, cough, and body aches.  Nontoxic-appearing. Low suspicion for PE, however, patient is mildly tachycardic, afebrile, had sudden onset of symptoms, pleuritic chest pain, and lack of congestion.  Wells criteria score is 1.5, indicating low risk for PE. Cannot PERC due to pulse rate.  D-dimer positive.  CT shows no evidence of PE, but does show evidence of bronchitis.  Patient was reassessed multiple times during ED course and showed no signs of increased work of breathing or respiratory distress. The patient was given instructions for home care as well as return precautions. Patient voices understanding of these instructions, accepts the plan, and is comfortable with discharge.  Vitals:   08/15/17 1445 08/15/17 1500 08/15/17 1530 08/15/17 1545  BP: (!) 100/51 (!) 109/57 (!) 115/58 111/64  Pulse: 100 95 99 95  Resp: 20 20 (!) 24 (!) 22  Temp:      TempSrc:      SpO2: 94% 94% 96% 96%     Final Clinical Impressions(s) / ED Diagnoses   Final diagnoses:  Bronchitis    ED Discharge Orders        Ordered    albuterol (PROVENTIL HFA;VENTOLIN HFA) 108 (90 Base) MCG/ACT inhaler  Every 4 hours PRN     08/15/17 1550    predniSONE (STERAPRED UNI-PAK 21 TAB) 10 MG (21) TBPK tablet     08/15/17 1550    benzonatate (TESSALON) 100 MG capsule  Every 8 hours     08/15/17 1550    azithromycin (ZITHROMAX) 250 MG tablet  Daily     08/15/17 [place], Ilee Randleman C, PA-C 08/15/17 [place], Kevin, MD 08/16/17 1208

## 2017-08-17 ENCOUNTER — Other Ambulatory Visit: Payer: Self-pay

## 2017-08-17 ENCOUNTER — Encounter (HOSPITAL_COMMUNITY): Payer: Self-pay

## 2017-08-17 ENCOUNTER — Emergency Department (HOSPITAL_COMMUNITY)
Admission: EM | Admit: 2017-08-17 | Discharge: 2017-08-17 | Disposition: A | Discharge status: Home / Self Care | Payer: Self-pay | Attending: Emergency Medicine | Admitting: Emergency Medicine

## 2017-08-17 DIAGNOSIS — J40 Bronchitis, not specified as acute or chronic: Secondary | ICD-10-CM | POA: Insufficient documentation

## 2017-08-17 DIAGNOSIS — J45909 Unspecified asthma, uncomplicated: Secondary | ICD-10-CM | POA: Insufficient documentation

## 2017-08-17 NOTE — ED Provider Notes (Signed)
[facility] DEPT Provider Note  CSN: [id number] Arrival date & time: 08/17/17 7253  Chief Complaint(s) Cough and Chest Pain  HPI Alexandra Norris is a 42 y.o. female with a history of asthma and bronchitis seen here 2 days ago for approximately 1 week of productive cough, chest pain.  Workup was negative for pneumonia or blood clots, but noted to have bronchitis.  Patient was discharged with albuterol inhaler, prednisone, Tessalon Perles, azithromycin.  She presents today for continued symptoms.  Denies any new fevers or chills.  No nausea or vomiting.  No abdominal pain.  Chest pain at been persistent for several days.  Denies any other acute changes. HPI  Past Medical History Past Medical History:  Diagnosis Date  . Asthma   . History of blood transfusion 1999   w/vaginal delivery  . History of bronchitis    "I've stayed in hospital 2 X w/bronchitis"  . Migraine    Patient Active Problem List   Diagnosis Date Noted  . Pre-diabetes 10/21/2015  . Severe recurrent major depressive disorder with psychotic features ([place])   . Depressive disorder 07/06/2014  . Screening for malignant neoplasm of the cervix 01/03/2013  . GASTROESOPHAGEAL REFLUX DISEASE 08/14/2009  . ETHMOIDAL SINUSITIS, ACUTE 05/14/2009  . DEPRESSION, SITUATIONAL, PROLONGED 02/05/2009  . UNSPECIFIED VISUAL LOSS 11/27/2008  . CHALAZION, LEFT 11/27/2008  . OBESITY 05/30/2007  . IRREGULAR MENSTRUAL CYCLE 05/30/2007  . Asthma 12/06/2006   Home Medication(s) Prior to Admission medications   Medication Sig Start Date End Date Taking? Authorizing Provider  albuterol (PROVENTIL HFA;VENTOLIN HFA) 108 (90 Base) MCG/ACT inhaler Inhale 2 puffs into the lungs every 4 (four) hours as needed for wheezing or shortness of breath. 08/15/17  Yes Joy, Shawn C, PA-C  azithromycin (ZITHROMAX) 250 MG tablet Take 1 tablet (250 mg total) by mouth daily. Take first 2 tablets together, then 1 every day until  finished. 08/15/17  Yes Joy, Shawn C, PA-C  benzonatate (TESSALON) 100 MG capsule Take 1 capsule (100 mg total) by mouth every 8 (eight) hours. 08/15/17  Yes Joy, Shawn C, PA-C  ibuprofen (ADVIL,MOTRIN) 200 MG tablet Take 800 mg by mouth every 8 (eight) hours as needed for mild pain.    Yes [provider]  predniSONE (STERAPRED UNI-PAK 21 TAB) 10 MG (21) TBPK tablet Take 6 tabs (60mg ) on day 1, 5 tabs (50mg ) on day 2, 4 tabs (40mg ) on day 3, 3 tabs (30mg ) on day 4, 2 tabs (20mg ) on day 5, and 1 tab (10mg ) on day 6. 08/15/17  Yes Joy, Shawn C, PA-C  cyclobenzaprine (FLEXERIL) 10 MG tablet Take 0.5-1 tablets (5-10 mg total) by mouth 3 (three) times daily as needed for muscle spasms. Do not mix with narcotics. May cause drowsiness. Patient not taking: Reported on 08/17/2017 05/20/17   Tereasa Coop, PA-C  omeprazole (PRILOSEC) 20 MG capsule Take 1 capsule (20 mg total) by mouth daily. Patient not taking: Reported on 06/15/2017 03/04/17   Hedges, Dellis Filbert, PA-C  traMADol (ULTRAM) 50 MG tablet Take 0.5-1 tablets (25-50 mg total) by mouth every 12 (twelve) hours as needed for severe pain. For sever pain only. Patient not taking: Reported on 06/15/2017 05/20/17   Tereasa Coop, PA-C  Past Surgical History Past Surgical History:  Procedure Laterality Date  . CESAREAN SECTION  1997; 2008  . CHOLECYSTECTOMY  2011  . TUBAL LIGATION  04/2007   Family History Family History  Problem Relation Age of Onset  . Diabetes Mother   . Osteoarthritis Mother   . Hypertension Mother     Social History Social History   Tobacco Use  . Smoking status: Never Smoker  . Smokeless tobacco: Never Used  Substance Use Topics  . Alcohol use: No  . Drug use: No   Allergies Patient has no known allergies.  Review of Systems Review of Systems All other systems are reviewed and  are negative for acute change except as noted in the HPI  Physical Exam Vital Signs  I have reviewed the triage vital signs BP 136/84   Pulse 85   Temp 98.2 F (36.8 C) (Oral)   Resp 20   Ht 5\' 1"  (1.549 m)   Wt 94.3 kg (208 lb)   LMP 08/08/2017 (Approximate)   SpO2 96%   BMI 39.30 kg/m   Physical Exam  Constitutional: She is oriented to person, place, and time. She appears well-developed and well-nourished. No distress.  HENT:  Head: Normocephalic and atraumatic.  Nose: Nose normal.  Eyes: Conjunctivae and EOM are normal. Pupils are equal, round, and reactive to light. Right eye exhibits no discharge. Left eye exhibits no discharge. No scleral icterus.  Neck: Normal range of motion. Neck supple.  Cardiovascular: Normal rate and regular rhythm. Exam reveals no gallop and no friction rub.  No murmur heard. Pulmonary/Chest: Effort normal and breath sounds normal. No stridor. No respiratory distress. She has no wheezes. She has no rales.  Abdominal: Soft. She exhibits no distension. There is no tenderness.  Musculoskeletal: She exhibits no edema or tenderness.  Neurological: She is alert and oriented to person, place, and time.  Skin: Skin is warm and dry. No rash noted. She is not diaphoretic. No erythema.  Psychiatric: She has a normal mood and affect.  Vitals reviewed.   ED Results and Treatments Labs (all labs ordered are listed, but only abnormal results are displayed) Labs Reviewed - No data to display                                                                                                                       EKG  EKG Interpretation  Date/Time:  Tuesday August 17 2017 08:14:25 EDT Ventricular Rate:  87 PR Interval:    QRS Duration: 83 QT Interval:  371 QTC Calculation: 447 R Axis:   72 Text Interpretation:  Sinus rhythm Multiple ventricular premature complexes No significant change since last tracing Confirmed by Addison Lank [phone]) on 08/17/2017 10:01:17  AM      Radiology No results found. Pertinent labs & imaging results that were available during my care of the patient were reviewed by me and considered in my medical decision making (see chart for details).  Medications Ordered in ED Medications -  No data to display                                                                                                                                  Procedures Procedures  (including critical care time)  Medical Decision Making / ED Course I have reviewed the nursing notes for this encounter and the patient's prior records (if available in EHR or on provided paperwork).    Patient is well-appearing, well-hydrated.  She is afebrile with stable vital signs.  Lungs clear to auscultation.  Patient does have evidence of upper respiratory infection on exam.  Recent imaging confirmed bronchitis.  I doubt progression to pneumonia at this time.  Recommended continued management with the previously prescribed medication and supportive management.  The patient appears reasonably screened and/or stabilized for discharge and I doubt any other medical condition or other [facility] requiring further screening, evaluation, or treatment in the ED at this time prior to discharge.  The patient is safe for discharge with strict return precautions.   Final Clinical Impression(s) / ED Diagnoses Final diagnoses:  Bronchitis    Disposition: Discharge  Condition: Good  I have discussed the results, Dx and Tx plan with the patient who expressed understanding and agree(s) with the plan. Discharge instructions discussed at great length. The patient was given strict return precautions who verbalized understanding of the instructions. No further questions at time of discharge.    ED Discharge Orders    None       Follow Up: Charlott Rakes, MD [place] [address] [phone]  Schedule an appointment as soon as possible for a visit  in  5-7 days, If symptoms do not improve or  worsen     This chart was dictated using voice recognition software.  Despite best efforts to proofread,  errors can occur which can change the documentation meaning.   Fatima Blank, MD 08/17/17 1021

## 2017-08-17 NOTE — Discharge Instructions (Signed)
You may take over-the-counter medicine for symptomatic relief, such as Tylenol, Motrin, TheraFlu, Alka seltzer , black elderberry, etc. Please limit acetaminophen (Tylenol) to 4000 mg and Ibuprofen (Motrin, Advil, etc.) to 2400 mg for a 24hr period. Please note that other over-the-counter medicine may contain acetaminophen or ibuprofen as a component of their ingredients.   

## 2017-08-17 NOTE — ED Triage Notes (Signed)
Patient c/o non productive cough x 1 week and is now having left chest pain x 3 days. Patient ws at [facility] and received a breathing treatment. Patient states she was called by [facility] yesterday and stated that she had 2 blood clots.

## 2017-08-25 ENCOUNTER — Other Ambulatory Visit: Payer: Self-pay

## 2017-08-25 ENCOUNTER — Inpatient Hospital Stay: Payer: Self-pay | Admitting: Critical Care Medicine

## 2017-08-25 ENCOUNTER — Inpatient Hospital Stay: Payer: Self-pay | Admitting: Nurse Practitioner

## 2017-08-25 ENCOUNTER — Encounter: Payer: Self-pay | Admitting: Critical Care Medicine

## 2017-08-25 ENCOUNTER — Ambulatory Visit: Payer: Self-pay | Attending: Critical Care Medicine | Admitting: Critical Care Medicine

## 2017-08-25 VITALS — BP 126/58 | HR 92 | Temp 98.4°F | Resp 18 | Ht 61.0 in | Wt 211.0 lb

## 2017-08-25 DIAGNOSIS — J40 Bronchitis, not specified as acute or chronic: Secondary | ICD-10-CM | POA: Insufficient documentation

## 2017-08-25 DIAGNOSIS — K219 Gastro-esophageal reflux disease without esophagitis: Secondary | ICD-10-CM | POA: Insufficient documentation

## 2017-08-25 DIAGNOSIS — R0602 Shortness of breath: Secondary | ICD-10-CM | POA: Insufficient documentation

## 2017-08-25 DIAGNOSIS — J0111 Acute recurrent frontal sinusitis: Secondary | ICD-10-CM | POA: Insufficient documentation

## 2017-08-25 DIAGNOSIS — Z79899 Other long term (current) drug therapy: Secondary | ICD-10-CM | POA: Insufficient documentation

## 2017-08-25 DIAGNOSIS — J4541 Moderate persistent asthma with (acute) exacerbation: Secondary | ICD-10-CM | POA: Insufficient documentation

## 2017-08-25 MED ORDER — PREDNISONE 10 MG PO TABS: ORAL_TABLET | ORAL | 0 refills | Status: DC

## 2017-08-25 MED ORDER — FLUTICASONE PROPIONATE 50 MCG/ACT NA SUSP: 2.0000 | Freq: Every day | NASAL | 6 refills | Status: DC

## 2017-08-25 MED ORDER — AMOXICILLIN-POT CLAVULANATE 875-125 MG PO TABS: 1.0000 | ORAL_TABLET | Freq: Two times a day (BID) | ORAL | 0 refills | Status: DC

## 2017-08-25 MED ORDER — OMEPRAZOLE 20 MG PO CPDR
20.0000 mg | DELAYED_RELEASE_CAPSULE | Freq: Every day | ORAL | 6 refills | Status: DC
Start: 2017-08-25 — End: 2017-12-08

## 2017-08-25 NOTE — Assessment & Plan Note (Signed)
Acute frontal sinusitis Plan  Augmentin x 7days flonase daily

## 2017-08-25 NOTE — Progress Notes (Signed)
Subjective:    Patient ID: Alexandra Norris, female    DOB: 11/22/1975, 42 y.o.   MRN: [id number]  42 y.o.F here for f/u of bronchitis and asthma flare.  Seen in ED twice this month for same No PNA or PE on imaging studies.  Rx azithromycin/pred/albuterol inhaler.  Since second ED visit: Pt still with shortness of breath, worse at night      Shortness of Breath  This is a new problem. The current episode started 1 to 4 weeks ago. The problem occurs daily (worse at night, with exertion: 182ft.  ). The problem has been unchanged. Associated symptoms include chest pain, ear pain, a fever, leg swelling, orthopnea, PND, sputum production and wheezing. Pertinent negatives include no headaches, hemoptysis, leg pain, rhinorrhea or sore throat. The symptoms are aggravated by any activity, lying flat, exercise and weather changes. Associated symptoms comments: Notes bifrontal sinus pain and Right ear Notes GERD . She has tried beta agonist inhalers for the symptoms. The treatment provided no relief. Her past medical history is significant for asthma and pneumonia.    Past Medical History:  Diagnosis Date  . Asthma   . History of blood transfusion 1999   w/vaginal delivery  . History of bronchitis    "I've stayed in hospital 2 X w/bronchitis"  . Migraine      Family History  Problem Relation Age of Onset  . Diabetes Mother   . Osteoarthritis Mother   . Hypertension Mother      Social History   Socioeconomic History  . Marital status: Married    Spouse name: Not on file  . Number of children: Not on file  . Years of education: Not on file  . Highest education level: Not on file  Social Needs  . Financial resource strain: Not on file  . Food insecurity - worry: Not on file  . Food insecurity - inability: Not on file  . Transportation needs - medical: Not on file  . Transportation needs - non-medical: Not on file  Occupational History  . Not on file  Tobacco Use  . Smoking  status: Never Smoker  . Smokeless tobacco: Never Used  Substance and Sexual Activity  . Alcohol use: No  . Drug use: No  . Sexual activity: Yes    Birth control/protection: None  Other Topics Concern  . Not on file  Social History Narrative  . Not on file     No Known Allergies   Outpatient Medications Prior to Visit  Medication Sig Dispense Refill  . albuterol (PROVENTIL HFA;VENTOLIN HFA) 108 (90 Base) MCG/ACT inhaler Inhale 2 puffs into the lungs every 4 (four) hours as needed for wheezing or shortness of breath. 1 Inhaler 0  . ibuprofen (ADVIL,MOTRIN) 200 MG tablet Take 800 mg by mouth every 8 (eight) hours as needed for mild pain.     Marland Kitchen azithromycin (ZITHROMAX) 250 MG tablet Take 1 tablet (250 mg total) by mouth daily. Take first 2 tablets together, then 1 every day until finished. (Patient not taking: Reported on 08/25/2017) 6 tablet 0  . benzonatate (TESSALON) 100 MG capsule Take 1 capsule (100 mg total) by mouth every 8 (eight) hours. (Patient not taking: Reported on 08/25/2017) 21 capsule 0  . cyclobenzaprine (FLEXERIL) 10 MG tablet Take 0.5-1 tablets (5-10 mg total) by mouth 3 (three) times daily as needed for muscle spasms. Do not mix with narcotics. May cause drowsiness. (Patient not taking: Reported on 08/17/2017) 30 tablet 0  . omeprazole (  PRILOSEC) 20 MG capsule Take 1 capsule (20 mg total) by mouth daily. (Patient not taking: Reported on 06/15/2017) 30 capsule 0  . predniSONE (STERAPRED UNI-PAK 21 TAB) 10 MG (21) TBPK tablet Take 6 tabs (60mg ) on day 1, 5 tabs (50mg ) on day 2, 4 tabs (40mg ) on day 3, 3 tabs (30mg ) on day 4, 2 tabs (20mg ) on day 5, and 1 tab (10mg ) on day 6. (Patient not taking: Reported on 08/25/2017) 21 tablet 0  . traMADol (ULTRAM) 50 MG tablet Take 0.5-1 tablets (25-50 mg total) by mouth every 12 (twelve) hours as needed for severe pain. For sever pain only. (Patient not taking: Reported on 06/15/2017) 15 tablet 0   No facility-administered medications prior to  visit.      Review of Systems  Constitutional: Positive for fever.  HENT: Positive for ear pain. Negative for rhinorrhea and sore throat.   Respiratory: Positive for sputum production, shortness of breath and wheezing. Negative for hemoptysis.   Cardiovascular: Positive for chest pain, orthopnea, leg swelling and PND.  Neurological: Negative for headaches.       Objective:   Physical Exam Vitals:   08/25/17 0853  BP: (!) 126/58  Pulse: 92  Resp: 18  Temp: 98.4 F (36.9 C)  TempSrc: Oral  Weight: 211 lb (95.7 kg)  Height: 5\' 1"  (1.549 m)    Gen: Pleasant, well-nourished, in no distress,  normal affect  ENT: No lesions,  mouth clear,  oropharynx clear, no postnasal drip, nasal turbinate edema, frontal sinus tenderness  Neck: No JVD, no TMG, no carotid bruits  Lungs: No use of accessory muscles, no dullness to percussion, bibasilar rales, exp wheezes  Cardiovascular: RRR, heart sounds normal, no murmur or gallops, no peripheral edema  Abdomen: soft and NT, no HSM,  BS normal  Musculoskeletal: No deformities, no cyanosis or clubbing  Neuro: alert, non focal  Skin: Warm, no lesions or rashes   Ct Angio Chest 3/10: IMPRESSION: 1. No evidence of pulmonary embolism. 2. Mild central peribronchial thickening as can be seen with acute bronchitis. No airspace disease or consolidation.        Assessment & Plan:  I personally reviewed all images and lab data in the [facility] system as well as any outside material available during this office visit and agree with the  radiology impressions.   Moderate persistent asthma with acute exacerbation Moderate persistent asthma with flare d/t GERD and sinusitis Plan Pulse Prednisone Start nasal steroid Augmentin 875mg  bid for 7days Prn SABA, reintstructed as to proper HFA use  Reflux diet PPI daily  Sinusitis, acute Acute frontal sinusitis Plan  Augmentin x 7days flonase daily   Tracheobronchitis Acute  tracheobronchitis with asthma flare Non smoker Plan Pulse prednisone Prn SABA Augmentin x 7days  GASTROESOPHAGEAL REFLUX DISEASE GERD flare Plan PPI GERD diet    Alexandra Norris was seen today for bronchitis.  Diagnoses and all orders for this visit:  Acute recurrent frontal sinusitis  Gastroesophageal reflux disease without esophagitis  Moderate persistent asthma with acute exacerbation  Tracheobronchitis  Other orders -     predniSONE (DELTASONE) 10 MG tablet; Take 4 for three days 3 for three days 2 for three days 1 for three days and stop -     amoxicillin-clavulanate (AUGMENTIN) 875-125 MG tablet; Take 1 tablet by mouth 2 (two) times daily. -     fluticasone (FLONASE) 50 MCG/ACT nasal spray; Place 2 sprays into both nostrils daily. -     omeprazole (PRILOSEC) 20 MG capsule; Take  1 capsule (20 mg total) by mouth daily. Before breakfast

## 2017-08-25 NOTE — Assessment & Plan Note (Signed)
Acute tracheobronchitis with asthma flare Non smoker Plan Pulse prednisone Prn SABA Augmentin x 7days

## 2017-08-25 NOTE — Patient Instructions (Signed)
Resume prednisone 10mg  Take 4 for three days 3 for three days 2 for three days 1 for three days and stop Start Augmentin one twice daily for 7days Start fluticasone nasal spray two puff each nostril daily Start omeprazole one daily 1/2 hour before breakfast Stay on albuterol inhaler 2 puff every 4-6 hours as needed Return April 24 for a recheck Follow a reflux diet  Enfermedad por reflujo gastroesofgico en los adultos (Gastroesophageal Reflux Disease, Adult) Normalmente, los alimentos descienden por el esfago y se depositan en el estmago para su digestin. Si una persona tiene enfermedad por reflujo gastroesofgico (ERGE), los alimentos y el cido estomacal regresan al esfago. Cuando esto ocurre, el esfago se irrita y se hincha (inflama). Con el tiempo, la ERGE puede provocar la formacin de pequeas perforaciones (lceras) en la mucosa del esfago. CUIDADOS EN EL HOGAR Dieta  Siga la dieta como se lo haya indicado el mdico. Tal vez deba evitar los siguientes alimentos y bebidas: ? Caf y t (con o sin cafena). ? Bebidas que contengan alcohol. ? Bebidas energizantes y deportivas. ? Gaseosas o refrescos. ? Chocolate y cacao. ? Menta y [place]. ? Ajo y cebollas. ? Rbano picante. ? Alimentos muy condimentados y cidos, como pimientos, [place] en polvo, curry en polvo, vinagre, salsas picantes y Engineering geologist. ? Frutas ctricas y sus jugos, como naranjas, limones y limas. ? Alimentos a base de tomates, como salsa roja, [place], salsa y pizza con salsa roja. ? Alimentos fritos y Radio broadcast assistant, como rosquillas, papas fritas y aderezos con alto contenido de Lobbyist. ? Carnes con alto contenido de [place], como hot dogs, filetes de entrecot, salchicha, jamn y tocino. ? Productos lcteos con alto contenido de grasa, como [place], [place] crema.  Consuma pequeas porciones de comida con ms frecuencia. Evite consumir porciones abundantes.  Evite beber [place]  comidas.  No coma durante las 2 o 3horas previas a la hora de [place].  No se acueste inmediatamente despus de comer.  No haga actividad fsica enseguida despus de comer. Instrucciones generales  Est atento a cualquier cambio en los sntomas.  Tome los medicamentos de venta libre y los recetados solamente como se lo haya indicado el mdico. No tome aspirina, ibuprofeno ni otros antiinflamatorios no esteroides (AINE), a menos que el mdico lo autorice.  No consuma ningn producto que contenga tabaco, lo que incluye cigarrillos, tabaco de Higher education careers adviser y Psychologist, sport and exercise. Si necesita ayuda para dejar de fumar, consulte al mdico.  Use ropa suelta. No use nada ajustado alrededor [employer].  Levante (eleve) unas 6pulgadas (15centmetros) la cabecera de la cama.  Intente bajar el nivel de estrs. Si necesita ayuda para hacerlo, consulte al [employer].  Si tiene sobrepeso, Multimedia programmer un peso saludable. Pregntele a su mdico cmo puede perder peso de manera segura.  Concurra a todas las visitas de control como se lo haya indicado el mdico. Esto es importante. SOLICITE AYUDA SI:  Aparecen nuevos sntomas.  Clinch y no sabe por qu.  Tiene dificultad para tragar o siente dolor al [employer].  Tiene sibilancias o tos que no desaparece.  Los sntomas no mejoran con Dispensing optician.  Tiene la voz ronca. SOLICITE AYUDA DE INMEDIATO SI:  Tiene dolor en los brazos, el cuello, los [place], la dentadura o la espalda.  Philbert Riser, se marea o tiene sensacin de desvanecimiento.  Siente falta de aire o Tourist information centre manager.  Vomita y el vmito es parecido a la  sangre o a los granos de caf.  Pierde el conocimiento (se desmaya).  Las heces son sanguinolentas o de color negro.  No puede tragar, beber o comer. Esta informacin no tiene Marine scientist el consejo del mdico. Asegrese de hacerle al mdico cualquier pregunta que tenga. Document Released:  06/27/2010 Document Revised: 02/13/2015 Document Reviewed: 09/19/2014 Elsevier Interactive Patient Education  [employer].

## 2017-08-25 NOTE — Assessment & Plan Note (Signed)
GERD flare Plan PPI GERD diet

## 2017-08-25 NOTE — Progress Notes (Signed)
Chest pain due to bronchitis   Fatigue out of breathe

## 2017-08-25 NOTE — Assessment & Plan Note (Addendum)
Moderate persistent asthma with flare d/t GERD and sinusitis Plan Pulse Prednisone Start nasal steroid Augmentin 875mg  bid for 7days Prn SABA, reintstructed as to proper HFA use  Reflux diet PPI daily

## 2017-09-29 ENCOUNTER — Ambulatory Visit: Payer: Self-pay | Admitting: Critical Care Medicine

## 2017-09-29 NOTE — Progress Notes (Deleted)
Subjective:    Patient ID: Alexandra Norris, female    DOB: [birth year], 42 y.o.   MRN: [id number]  42 y.o.F here for f/u of bronchitis and asthma flare.  Seen in ED twice this month for same No PNA or PE on imaging studies.  Rx azithromycin/pred/albuterol inhaler.  Since second ED visit: Pt still with shortness of breath, worse at night     Shortness of Breath  This is a new problem. The current episode started 1 to 4 weeks ago. The problem occurs daily (worse at night, with exertion: 17ft.  ). The problem has been unchanged. Associated symptoms include chest pain, ear pain, a fever, leg swelling, orthopnea, PND, sputum production and wheezing. Pertinent negatives include no headaches, hemoptysis, leg pain, rhinorrhea or sore throat. The symptoms are aggravated by any activity, lying flat, exercise and weather changes. Associated symptoms comments: Notes bifrontal sinus pain and Right ear Notes GERD . She has tried beta agonist inhalers for the symptoms. The treatment provided no relief. Her past medical history is significant for asthma and pneumonia.    Past Medical History:  Diagnosis Date  . Asthma   . History of blood transfusion 1999   w/vaginal delivery  . History of bronchitis    "I've stayed in hospital 2 X w/bronchitis"  . Migraine      Family History  Problem Relation Age of Onset  . Diabetes Mother   . Osteoarthritis Mother   . Hypertension Mother      Social History   Socioeconomic History  . Marital status: Married    Spouse name: Not on file  . Number of children: Not on file  . Years of education: Not on file  . Highest education level: Not on file  Occupational History  . Not on file  Social Needs  . Financial resource strain: Not on file  . Food insecurity:    Worry: Not on file    Inability: Not on file  . Transportation needs:    Medical: Not on file    Non-medical: Not on file  Tobacco Use  . Smoking status: Never Smoker  . Smokeless  tobacco: Never Used  Substance and Sexual Activity  . Alcohol use: No  . Drug use: No  . Sexual activity: Yes    Birth control/protection: None  Lifestyle  . Physical activity:    Days per week: Not on file    Minutes per session: Not on file  . Stress: Not on file  Relationships  . Social connections:    Talks on phone: Not on file    Gets together: Not on file    Attends religious service: Not on file    Active member of club or organization: Not on file    Attends meetings of clubs or organizations: Not on file    Relationship status: Not on file  . Intimate partner violence:    Fear of current or ex partner: Not on file    Emotionally abused: Not on file    Physically abused: Not on file    Forced sexual activity: Not on file  Other Topics Concern  . Not on file  Social History Narrative  . Not on file     No Known Allergies   Outpatient Medications Prior to Visit  Medication Sig Dispense Refill  . albuterol (PROVENTIL HFA;VENTOLIN HFA) 108 (90 Base) MCG/ACT inhaler Inhale 2 puffs into the lungs every 4 (four) hours as needed for wheezing or shortness of breath.  1 Inhaler 0  . amoxicillin-clavulanate (AUGMENTIN) 875-125 MG tablet Take 1 tablet by mouth 2 (two) times daily. 14 tablet 0  . fluticasone (FLONASE) 50 MCG/ACT nasal spray Place 2 sprays into both nostrils daily. 16 g 6  . ibuprofen (ADVIL,MOTRIN) 200 MG tablet Take 800 mg by mouth every 8 (eight) hours as needed for mild pain.     Marland Kitchen omeprazole (PRILOSEC) 20 MG capsule Take 1 capsule (20 mg total) by mouth daily. Before breakfast 30 capsule 6  . predniSONE (DELTASONE) 10 MG tablet Take 4 for three days 3 for three days 2 for three days 1 for three days and stop 30 tablet 0   No facility-administered medications prior to visit.      Review of Systems  Constitutional: Positive for fever.  HENT: Positive for ear pain. Negative for rhinorrhea and sore throat.   Respiratory: Positive for sputum production,  shortness of breath and wheezing. Negative for hemoptysis.   Cardiovascular: Positive for chest pain, orthopnea, leg swelling and PND.  Neurological: Negative for headaches.       Objective:   Physical Exam There were no vitals filed for this visit.  Gen: Pleasant, well-nourished, in no distress,  normal affect  ENT: No lesions,  mouth clear,  oropharynx clear, no postnasal drip, nasal turbinate edema, frontal sinus tenderness  Neck: No JVD, no TMG, no carotid bruits  Lungs: No use of accessory muscles, no dullness to percussion, bibasilar rales, exp wheezes  Cardiovascular: RRR, heart sounds normal, no murmur or gallops, no peripheral edema  Abdomen: soft and NT, no HSM,  BS normal  Musculoskeletal: No deformities, no cyanosis or clubbing  Neuro: alert, non focal  Skin: Warm, no lesions or rashes   Ct Angio Chest 3/10: IMPRESSION: 1. No evidence of pulmonary embolism. 2. Mild central peribronchial thickening as can be seen with acute bronchitis. No airspace disease or consolidation.        Assessment & Plan:  I personally reviewed all images and lab data in the [facility] system as well as any outside material available during this office visit and agree with the  radiology impressions.   No problem-specific Assessment & Plan notes found for this encounter.   There are no diagnoses linked to this encounter.

## 2017-10-11 ENCOUNTER — Encounter (HOSPITAL_COMMUNITY): Payer: Self-pay | Admitting: Family Medicine

## 2017-10-11 ENCOUNTER — Ambulatory Visit (INDEPENDENT_AMBULATORY_CARE_PROVIDER_SITE_OTHER): Payer: Self-pay

## 2017-10-11 ENCOUNTER — Ambulatory Visit (HOSPITAL_COMMUNITY)
Admission: EM | Admit: 2017-10-11 | Discharge: 2017-10-11 | Disposition: A | Discharge status: Home / Self Care | Payer: Self-pay | Attending: Family Medicine | Admitting: Family Medicine

## 2017-10-11 DIAGNOSIS — R0602 Shortness of breath: Secondary | ICD-10-CM

## 2017-10-11 DIAGNOSIS — J40 Bronchitis, not specified as acute or chronic: Secondary | ICD-10-CM

## 2017-10-11 DIAGNOSIS — R110 Nausea: Secondary | ICD-10-CM

## 2017-10-11 IMAGING — DX DG CHEST 2V
2 series · 2 of 2 positions shown · non-contrast
Comparison: Chest CT dated [DATE]

CLINICAL DATA: 42-year-old female with shortness of breath and
wheezing.

EXAM:
CHEST - 2 VIEW

[chest pa]
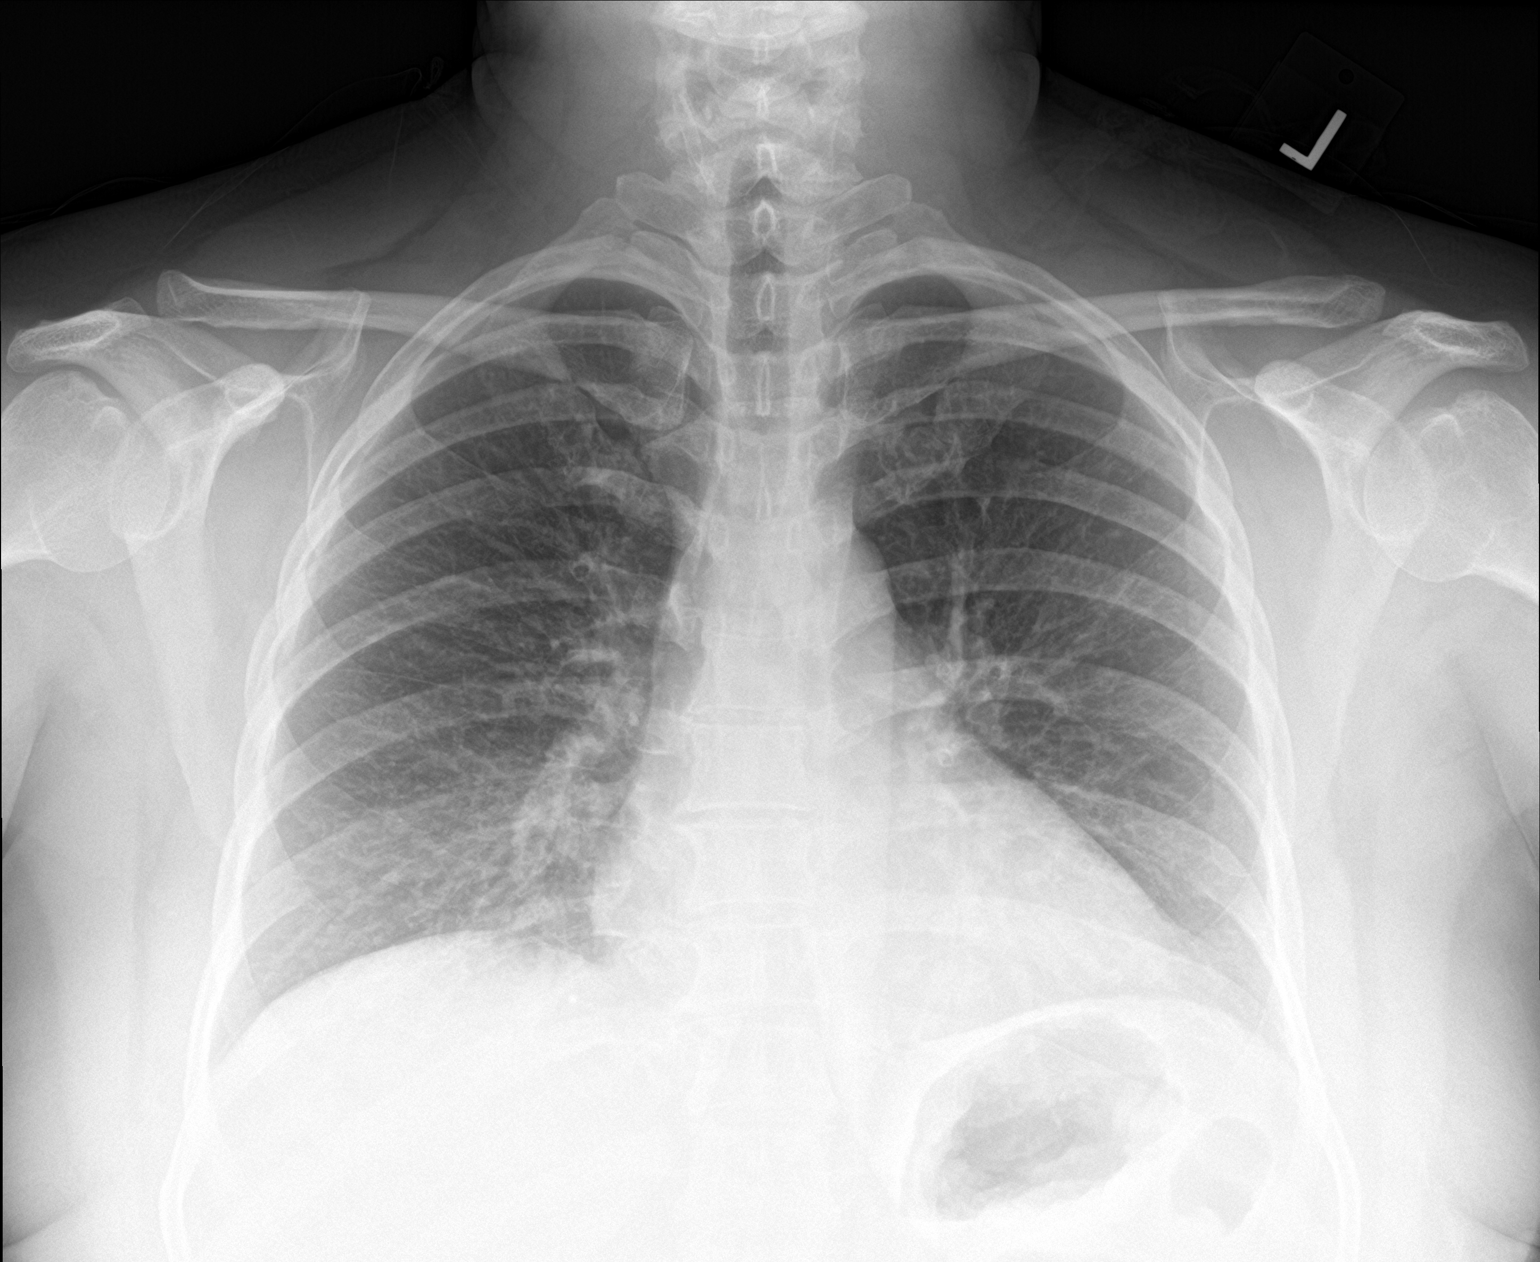

[chest lat]
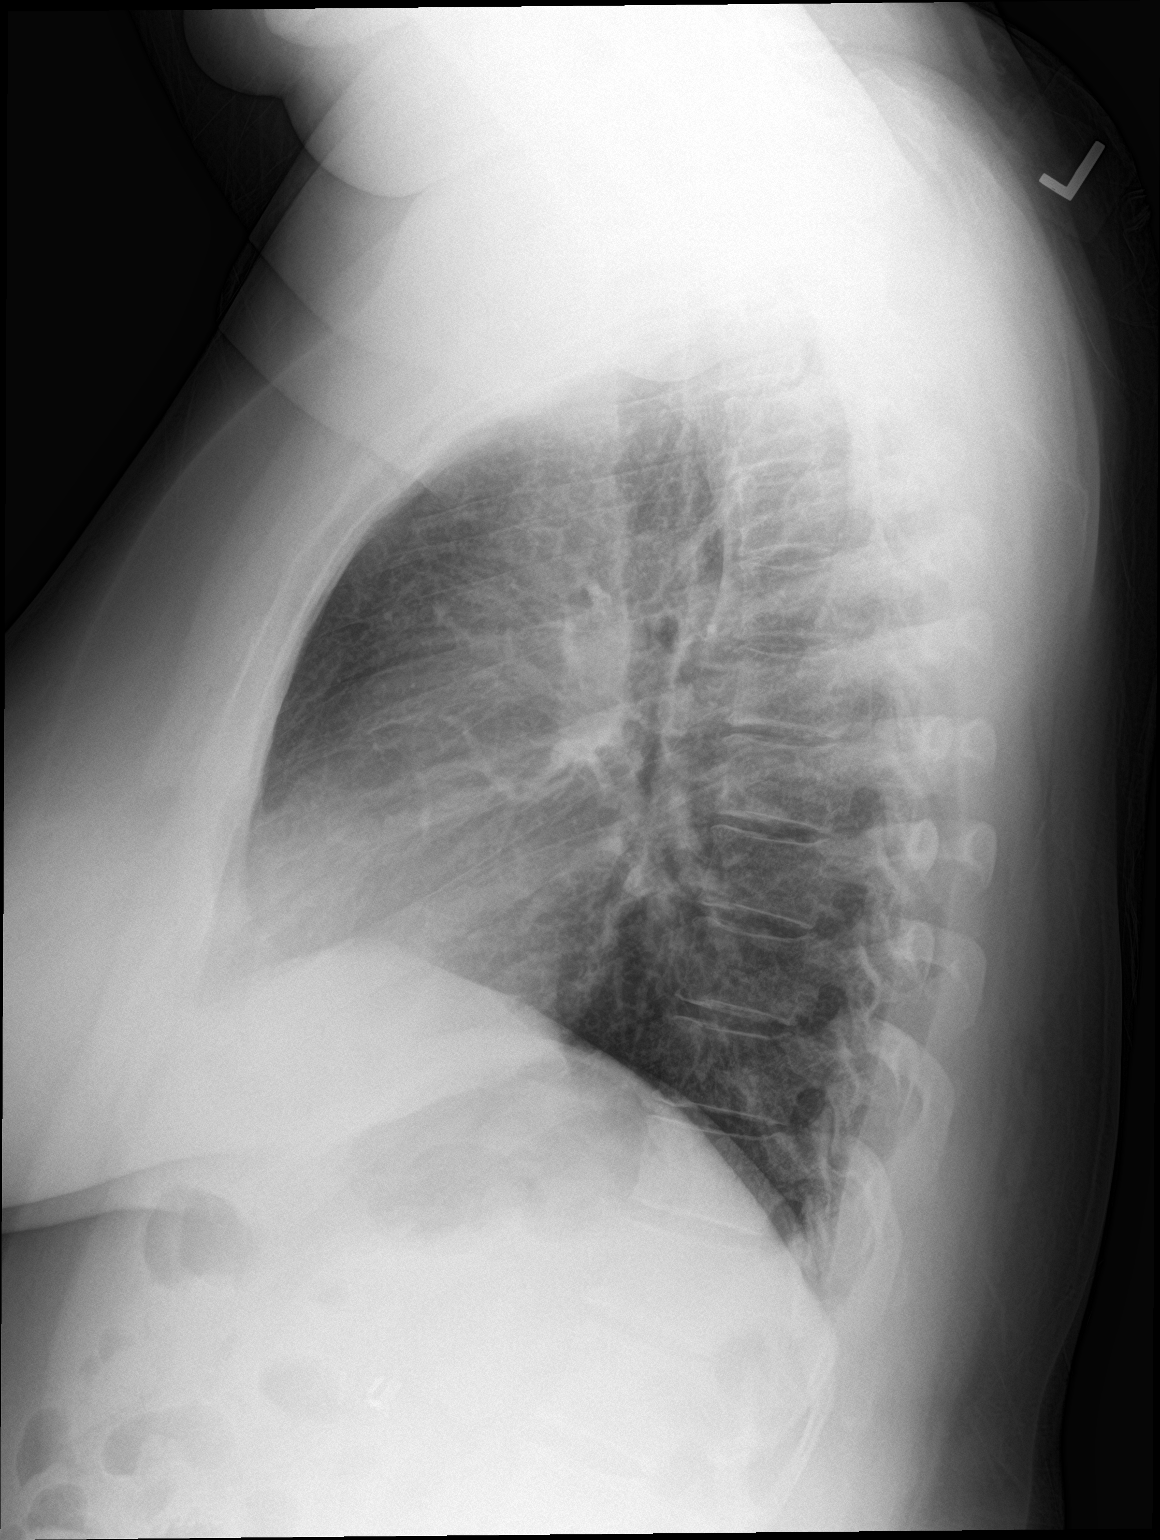

[2 of 2 positions shown; findings below may reference images not displayed]

FINDINGS: The heart size and mediastinal contours are within normal limits.
Both lungs are clear. The visualized skeletal structures are
unremarkable.
IMPRESSION: No active cardiopulmonary disease.

## 2017-10-11 MED ORDER — IPRATROPIUM-ALBUTEROL 0.5-2.5 (3) MG/3ML IN SOLN
3.0000 mL | Freq: Once | RESPIRATORY_TRACT | Status: AC
  Administered 2017-10-11: 3 mL via RESPIRATORY_TRACT

## 2017-10-11 MED ORDER — PREDNISONE 50 MG PO TABS: 50.0000 mg | ORAL_TABLET | Freq: Every day | ORAL | 0 refills | Status: AC

## 2017-10-11 MED ORDER — METHYLPREDNISOLONE SODIUM SUCC 125 MG IJ SOLR
INTRAMUSCULAR | Status: AC
  Filled 2017-10-11: qty 2

## 2017-10-11 MED ORDER — BENZONATATE 100 MG PO CAPS: 100.0000 mg | ORAL_CAPSULE | Freq: Three times a day (TID) | ORAL | 0 refills | Status: DC

## 2017-10-11 MED ORDER — PREDNISONE 50 MG PO TABS: 50.0000 mg | ORAL_TABLET | Freq: Every day | ORAL | 0 refills | Status: DC

## 2017-10-11 MED ORDER — IPRATROPIUM-ALBUTEROL 0.5-2.5 (3) MG/3ML IN SOLN
RESPIRATORY_TRACT | Status: AC
  Filled 2017-10-11: qty 3

## 2017-10-11 NOTE — ED Provider Notes (Signed)
Alexandra Norris    CSN: [id number] Arrival date & time: 10/11/17  1947     History   Chief Complaint Chief Complaint  Patient presents with  . Shortness of Breath    HPI Alexandra Norris is a 42 y.o. female.   42 year old female comes in for 4 day history of dry cough, shortness of breath, wheezing.  She has also had dyspnea on exertion.  Denies rhinorrhea, nasal congestion, sneezing.  Does have watery/itchy eyes.  Denies fever, chills, night sweats.  She has also had epigastric pain since coughing, states has a burning sensation during cough.  She has nausea with 3 episodes of vomiting.  She has been able to eat and drink, and states the epigastric pain is worse with eating.  Denies diarrhea.  She has had some chest soreness with cough.  Denies history of cardiac disease.  Has been using albuterol without relief.  Denies worsening of cough at nighttime.  Denies orthopnea. States has had leg swelling that has been chronic without acute changes.      Past Medical History:  Diagnosis Date  . Asthma   . History of blood transfusion 1999   w/vaginal delivery  . History of bronchitis    "I've stayed in hospital 2 X w/bronchitis"  . Migraine     Patient Active Problem List   Diagnosis Date Noted  . Tracheobronchitis 08/25/2017  . Seasonal allergies 10/08/2016  . Dysmenorrhea 10/08/2016  . Pre-diabetes 10/21/2015  . Bilateral chronic knee pain 09/25/2015  . Screening for malignant neoplasm of the cervix 01/03/2013  . GASTROESOPHAGEAL REFLUX DISEASE 08/14/2009  . Sinusitis, acute 05/14/2009  . DEPRESSION, SITUATIONAL, PROLONGED 02/05/2009  . UNSPECIFIED VISUAL LOSS 11/27/2008  . CHALAZION, LEFT 11/27/2008  . OBESITY 05/30/2007  . IRREGULAR MENSTRUAL CYCLE 05/30/2007  . Moderate persistent asthma with acute exacerbation 12/06/2006    Past Surgical History:  Procedure Laterality Date  . CESAREAN SECTION  1997; 2008  . CHOLECYSTECTOMY  2011  . tonsilletomy    .  TUBAL LIGATION  04/2007    OB History    Gravida  3   Para  3   Term  3   Preterm      AB      Living  3     SAB      TAB      Ectopic      Multiple      Live Births               Home Medications    Prior to Admission medications   Medication Sig Start Date End Date Taking? Authorizing Provider  albuterol (PROVENTIL HFA;VENTOLIN HFA) 108 (90 Base) MCG/ACT inhaler Inhale 2 puffs into the lungs every 4 (four) hours as needed for wheezing or shortness of breath. 08/15/17   Joy, Shawn C, PA-C  amoxicillin-clavulanate (AUGMENTIN) 875-125 MG tablet Take 1 tablet by mouth 2 (two) times daily. 08/25/17   Elsie Stain, MD  benzonatate (TESSALON) 100 MG capsule Take 1 capsule (100 mg total) by mouth every 8 (eight) hours. 10/11/17   Tasia Catchings, Keirstin Musil V, PA-C  fluticasone (FLONASE) 50 MCG/ACT nasal spray Place 2 sprays into both nostrils daily. 08/25/17   Elsie Stain, MD  ibuprofen (ADVIL,MOTRIN) 200 MG tablet Take 800 mg by mouth every 8 (eight) hours as needed for mild pain.     [provider]  omeprazole (PRILOSEC) 20 MG capsule Take 1 capsule (20 mg total) by mouth daily. Before  breakfast 08/25/17   Elsie Stain, MD  predniSONE (DELTASONE) 50 MG tablet Take 1 tablet (50 mg total) by mouth daily for 5 days. 10/11/17 10/16/17  Ok Edwards, PA-C    Family History Family History  Problem Relation Age of Onset  . Diabetes Mother   . Osteoarthritis Mother   . Hypertension Mother     Social History Social History   Tobacco Use  . Smoking status: Never Smoker  . Smokeless tobacco: Never Used  Substance Use Topics  . Alcohol use: No  . Drug use: No     Allergies   Patient has no known allergies.   Review of Systems Review of Systems  Reason unable to perform ROS: See HPI as above.     Physical Exam Triage Vital Signs ED Triage Vitals  Enc Vitals Group     BP 10/11/17 2022 104/77     Pulse Rate 10/11/17 2022 93     Resp 10/11/17 2022 (!) 22      Temp 10/11/17 2022 98.6 F (37 C)     Temp src --      SpO2 10/11/17 2022 97 %     Weight --      Height --      Head Circumference --      Peak Flow --      Pain Score 10/11/17 2020 0     Pain Loc --      Pain Edu? --      Excl. in [place]? --    No data found.  Updated Vital Signs BP 104/77   Pulse 93   Temp 98.6 F (37 C)   Resp (!) 22   LMP 10/11/2017   SpO2 97%   Physical Exam  Constitutional: She is oriented to person, place, and time. She appears well-developed and well-nourished. No distress.  HENT:  Head: Normocephalic and atraumatic.  Right Ear: Tympanic membrane, external ear and ear canal normal. Tympanic membrane is not erythematous and not bulging.  Left Ear: Tympanic membrane, external ear and ear canal normal. Tympanic membrane is not erythematous and not bulging.  Nose: Nose normal. Right sinus exhibits no maxillary sinus tenderness and no frontal sinus tenderness. Left sinus exhibits no maxillary sinus tenderness and no frontal sinus tenderness.  Mouth/Throat: Uvula is midline, oropharynx is clear and moist and mucous membranes are normal.  Eyes: Pupils are equal, round, and reactive to light. Conjunctivae are normal.  Neck: Normal range of motion. Neck supple.  Cardiovascular: Normal rate, regular rhythm and normal heart sounds. Exam reveals no gallop and no friction rub.  No murmur heard. Pulmonary/Chest: Effort normal. No accessory muscle usage. No respiratory distress.  Diffuse inspiratory and expiratory wheezing.  No obvious rhonchi, rales.   Abdominal: Soft. Bowel sounds are normal. She exhibits no distension. There is no tenderness. There is no rebound and no guarding.  Musculoskeletal:       Right lower leg: She exhibits no edema.       Left lower leg: She exhibits no edema.  Lymphadenopathy:    She has no cervical adenopathy.  Neurological: She is alert and oriented to person, place, and time.  Skin: Skin is warm and dry.  Psychiatric: She has a  normal mood and affect. Her behavior is normal. Judgment normal.     UC Treatments / Results  Labs (all labs ordered are listed, but only abnormal results are displayed) Labs Reviewed - No data to display  EKG None  Radiology  Dg Chest 2 View  Result Date: 10/11/2017 CLINICAL DATA:  42 year old female with shortness of breath and wheezing. EXAM: CHEST - 2 VIEW COMPARISON:  Chest CT dated 08/15/2017 FINDINGS: The heart size and mediastinal contours are within normal limits. Both lungs are clear. The visualized skeletal structures are unremarkable. IMPRESSION: No active cardiopulmonary disease. Electronically Signed   By: Anner Crete M.D.   On: 10/11/2017 21:24    Procedures Procedures (including critical care time)  Medications Ordered in UC Medications  ipratropium-albuterol (DUONEB) 0.5-2.5 (3) MG/3ML nebulizer solution 3 mL (3 mLs Nebulization Given 10/11/17 2047)  ipratropium-albuterol (DUONEB) 0.5-2.5 (3) MG/3ML nebulizer solution 3 mL (3 mLs Nebulization Given 10/11/17 2110)    Initial Impression / Assessment and Plan / UC Course  I have reviewed the triage vital signs and the nursing notes.  Pertinent labs & imaging results that were available during my care of the patient were reviewed by me and considered in my medical decision making (see chart for details).    Patient with improved symptoms after DuoNeb.  Still with some inspiratory and expiratory wheezing.  Mild crackles on bilateral bases.  Will obtain chest x-ray for further evaluation and start second DuoNeb.  Patient states with improvement of symptoms while on DuoNeb, with returned symptoms after stopping DuoNeb. Resolved wheezing with better air movement. Decreased crackles in the lower bases.   Discussed CXR results. Will start prednisone, continue albuterol inhaler as directed. Other symptomatic treatment discussed. Strict return precautions given. Patient expresses understanding and agrees to plan.   Final  Clinical Impressions(s) / UC Diagnoses   Final diagnoses:  Bronchitis  Shortness of breath    ED Prescriptions    Medication Sig Dispense Auth. Provider   predniSONE (DELTASONE) 50 MG tablet  (Status: Discontinued) Take 1 tablet (50 mg total) by mouth daily for 5 days. 5 tablet Braydn Carneiro V, PA-C   predniSONE (DELTASONE) 50 MG tablet Take 1 tablet (50 mg total) by mouth daily for 5 days. 5 tablet Joud Pettinato V, PA-C   benzonatate (TESSALON) 100 MG capsule Take 1 capsule (100 mg total) by mouth every 8 (eight) hours. 21 capsule Tobin Chad, [place] 10/11/17 2133

## 2017-10-11 NOTE — Discharge Instructions (Signed)
CXR negative for pneumonia/fluid build up. Start prednisone as directed. Albuterol as directed. Tessalon for cough. Keep hydrated, your urine should be clear to pale yellow in color. Tylenol/motrin for fever and pain.  Restart prilosec for acid reflux. If experiencing worsening symptoms, please go to the emergency department for further evaluation needed.

## 2017-10-11 NOTE — ED Triage Notes (Signed)
Pt here for 4 days of dry cough and SOB. She has been using her inhaler without relief.

## 2017-10-13 ENCOUNTER — Emergency Department (HOSPITAL_COMMUNITY)
Admission: EM | Admit: 2017-10-13 | Discharge: 2017-10-14 | Disposition: A | Discharge status: Home / Self Care | Payer: Self-pay | Attending: Emergency Medicine | Admitting: Emergency Medicine

## 2017-10-13 ENCOUNTER — Emergency Department (HOSPITAL_COMMUNITY): Payer: Self-pay

## 2017-10-13 ENCOUNTER — Encounter (HOSPITAL_COMMUNITY): Payer: Self-pay | Admitting: Emergency Medicine

## 2017-10-13 DIAGNOSIS — Z5321 Procedure and treatment not carried out due to patient leaving prior to being seen by health care provider: Secondary | ICD-10-CM | POA: Insufficient documentation

## 2017-10-13 DIAGNOSIS — R05 Cough: Secondary | ICD-10-CM | POA: Insufficient documentation

## 2017-10-13 IMAGING — DX DG CHEST 2V
2 series · 2 of 2 positions shown · non-contrast
Comparison: Chest radiograph dated [DATE]

CLINICAL DATA: 42-year-old female with persistent cough.

EXAM:
CHEST - 2 VIEW

[chest pa]
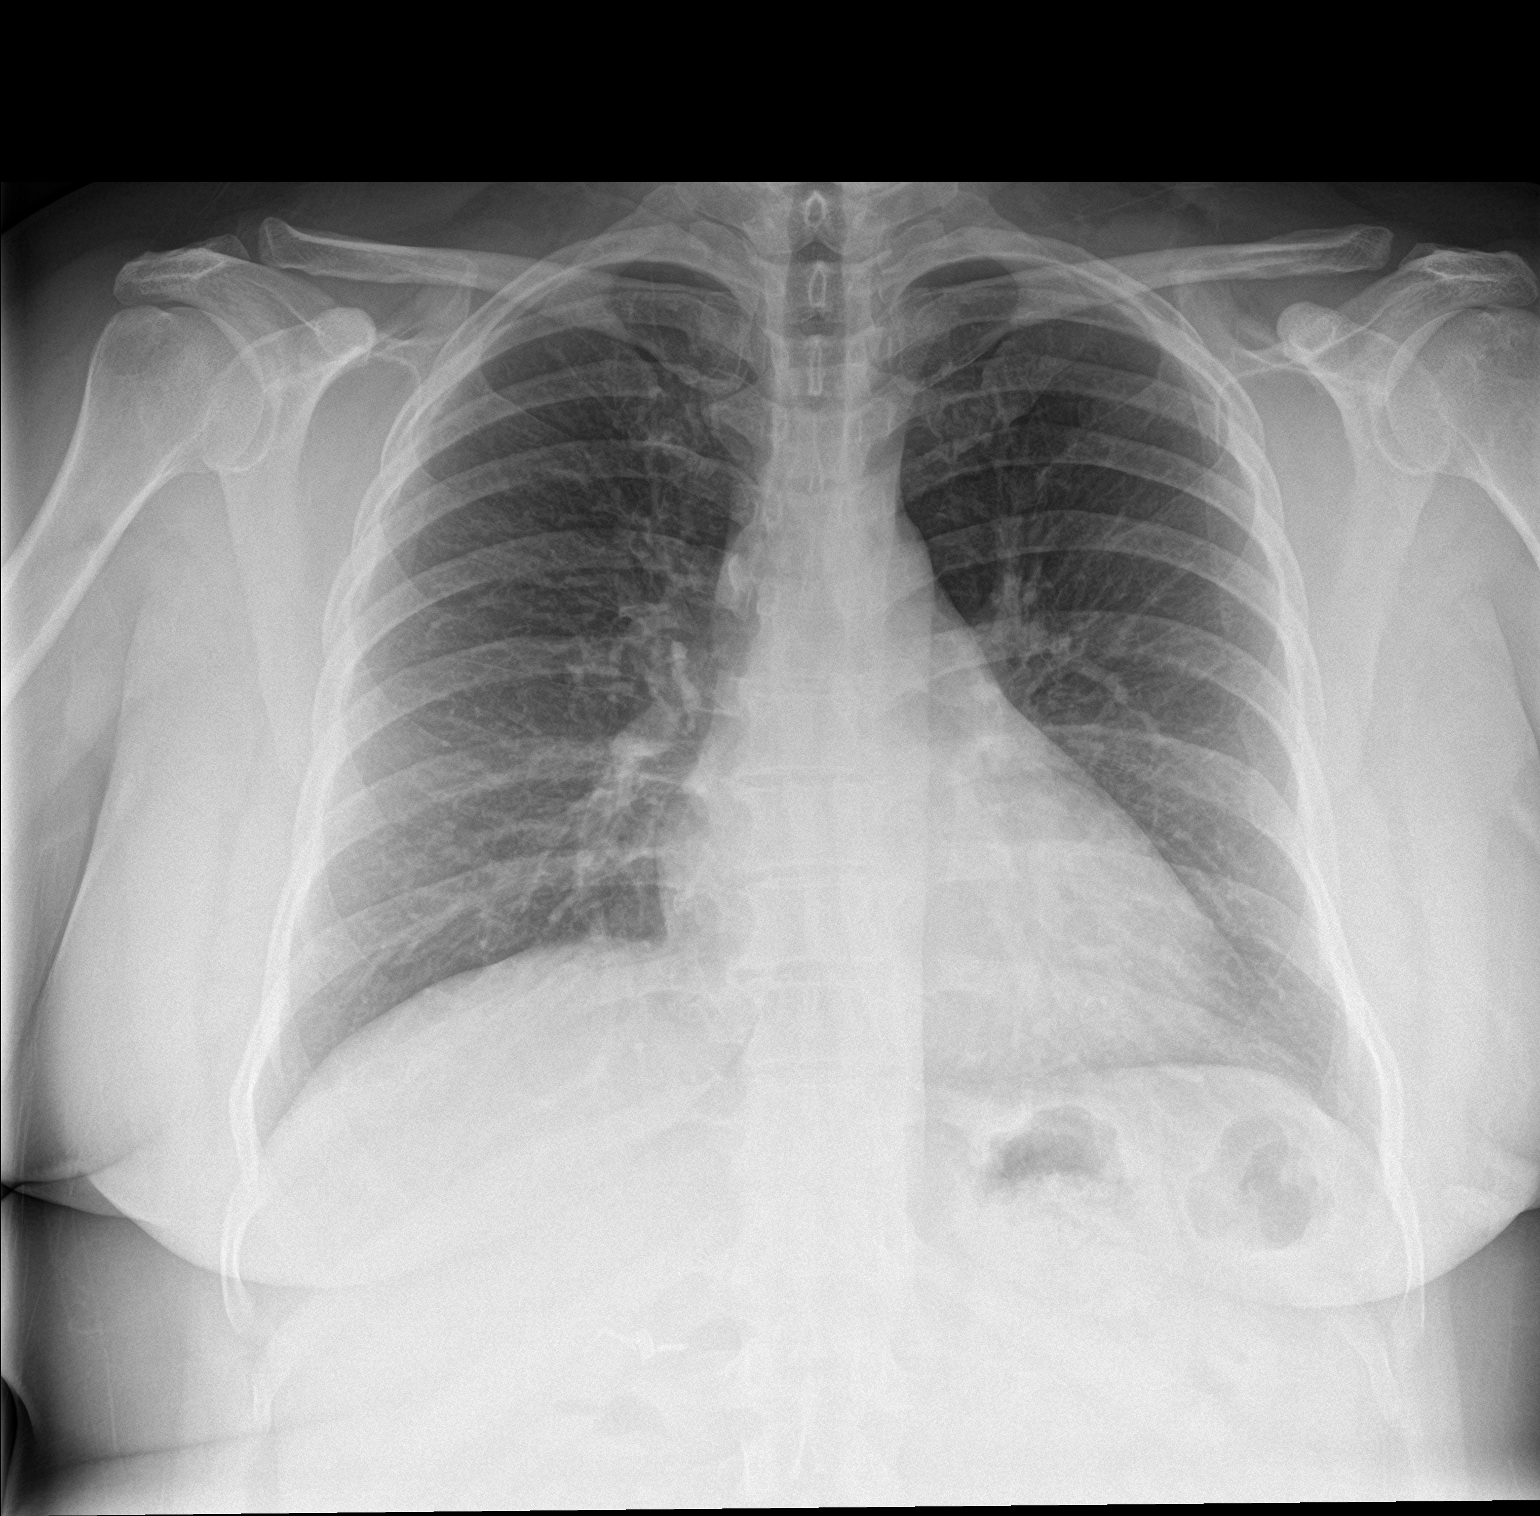

[chest lat]
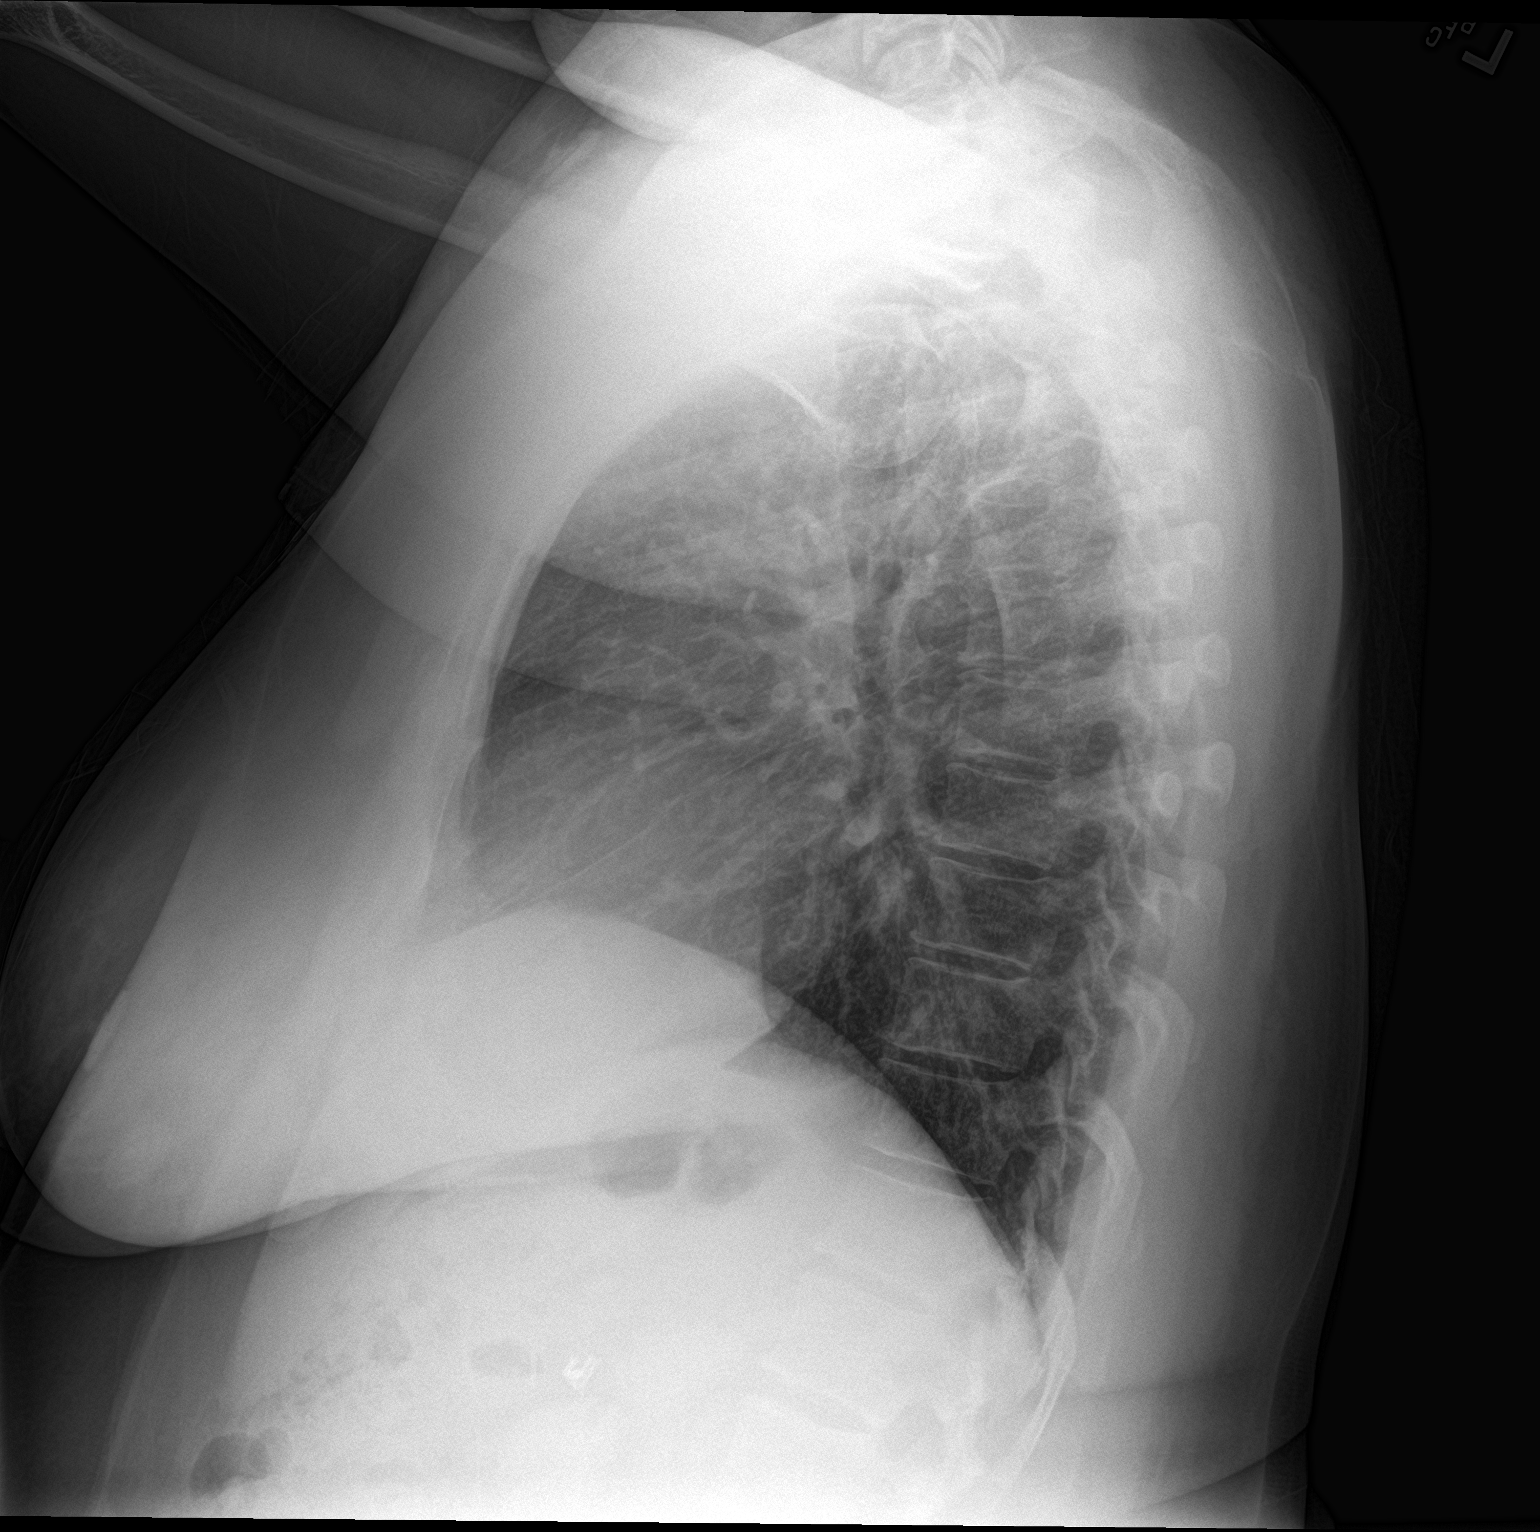

[2 of 2 positions shown; findings below may reference images not displayed]

FINDINGS: The heart size and mediastinal contours are within normal limits.
Both lungs are clear. The visualized skeletal structures are
unremarkable.
IMPRESSION: No active cardiopulmonary disease.

## 2017-10-13 MED ORDER — ALBUTEROL SULFATE (2.5 MG/3ML) 0.083% IN NEBU
5.0000 mg | INHALATION_SOLUTION | Freq: Once | RESPIRATORY_TRACT | Status: AC
  Administered 2017-10-13: 5 mg via RESPIRATORY_TRACT
  Filled 2017-10-13: qty 6

## 2017-10-13 NOTE — ED Triage Notes (Signed)
Patient reports persistent dry cough with chest congestion/wheezing for several days , denies fever or chills .

## 2017-10-14 ENCOUNTER — Telehealth: Payer: Self-pay | Admitting: Family Medicine

## 2017-10-14 NOTE — Telephone Encounter (Signed)
Per verbal message translated  by telephone receptionist  , patient wanted results from x-rays at the ED and Urgent Care. Informed patient that chest XR result from ED were negative.  If she had further question she would need to call the urgent care where she was evaluated.   Called patient to schedule a follow- up  appointment with provider. Pt aware to go to ED or Urgent Care for worsening sx's.

## 2017-10-14 NOTE — Telephone Encounter (Signed)
Patient called stating she went to urgent care 10/11/2017 for really bad chest pain and urgent care gave her 2 treatments of steroids and patient didn't feel any better. She went to the ED yesterday and same thing happen and still doesn't feel any better  Please follow up [phone]

## 2017-10-20 NOTE — Progress Notes (Deleted)
Patient ID: Alexandra Norris, female   DOB: [birth year], 42 y.o.   MRN: [id number] Seen at urgent care 10/11/2017 and treated for wheezing with albuterol and prednisone.  CXR showed NAD.

## 2017-10-21 ENCOUNTER — Ambulatory Visit: Payer: Self-pay

## 2017-12-08 ENCOUNTER — Ambulatory Visit (HOSPITAL_COMMUNITY)
Admission: EM | Admit: 2017-12-08 | Discharge: 2017-12-08 | Disposition: A | Discharge status: Home / Self Care | Payer: Self-pay | Attending: Internal Medicine | Admitting: Internal Medicine

## 2017-12-08 ENCOUNTER — Encounter (HOSPITAL_COMMUNITY): Payer: Self-pay | Admitting: Emergency Medicine

## 2017-12-08 DIAGNOSIS — R51 Headache: Secondary | ICD-10-CM

## 2017-12-08 DIAGNOSIS — R519 Headache, unspecified: Secondary | ICD-10-CM

## 2017-12-08 MED ORDER — ATENOLOL 25 MG PO TABS: 12.5000 mg | ORAL_TABLET | Freq: Two times a day (BID) | ORAL | 0 refills | Status: DC

## 2017-12-08 NOTE — ED Triage Notes (Signed)
Pt c/o pain in back of head x2 years.

## 2017-12-08 NOTE — ED Provider Notes (Signed)
[place]    CSN: [id number] Arrival date & time: 12/08/17  1801     History   Chief Complaint Chief Complaint  Patient presents with  . Headache    HPI Alexandra Norris is a 42 y.o. female.   The patient complains of chronic occipital headaches.  She has had a headache that is moderately severe for the last few days.  The patient is received steroid injections to her spine as well as muscle relaxers in the past without relief.  She denies difficulty swallowing or speaking.  She has no changes in her vision.  She denies weakness, numbness or tingling in her face or extremities.     Past Medical History:  Diagnosis Date  . Asthma   . History of blood transfusion 1999   w/vaginal delivery  . History of bronchitis    "I've stayed in hospital 2 X w/bronchitis"  . Migraine     Patient Active Problem List   Diagnosis Date Noted  . Tracheobronchitis 08/25/2017  . Seasonal allergies 10/08/2016  . Dysmenorrhea 10/08/2016  . Pre-diabetes 10/21/2015  . Bilateral chronic knee pain 09/25/2015  . Screening for malignant neoplasm of the cervix 01/03/2013  . GASTROESOPHAGEAL REFLUX DISEASE 08/14/2009  . Sinusitis, acute 05/14/2009  . DEPRESSION, SITUATIONAL, PROLONGED 02/05/2009  . UNSPECIFIED VISUAL LOSS 11/27/2008  . CHALAZION, LEFT 11/27/2008  . OBESITY 05/30/2007  . IRREGULAR MENSTRUAL CYCLE 05/30/2007  . Moderate persistent asthma with acute exacerbation 12/06/2006    Past Surgical History:  Procedure Laterality Date  . CESAREAN SECTION  1997; 2008  . CHOLECYSTECTOMY  2011  . tonsilletomy    . TUBAL LIGATION  04/2007    OB History    Gravida  3   Para  3   Term  3   Preterm      AB      Living  3     SAB      TAB      Ectopic      Multiple      Live Births               Home Medications    Prior to Admission medications   Medication Sig Start Date End Date Taking? Authorizing Provider  albuterol (PROVENTIL HFA;VENTOLIN  HFA) 108 (90 Base) MCG/ACT inhaler Inhale 2 puffs into the lungs every 4 (four) hours as needed for wheezing or shortness of breath. 08/15/17   Joy, Shawn C, PA-C  atenolol (TENORMIN) 25 MG tablet Take 0.5 tablets (12.5 mg total) by mouth 2 (two) times daily. 12/08/17 01/07/18  Harrie Foreman, MD  ibuprofen (ADVIL,MOTRIN) 200 MG tablet Take 800 mg by mouth every 8 (eight) hours as needed for mild pain.     [provider]    Family History Family History  Problem Relation Age of Onset  . Diabetes Mother   . Osteoarthritis Mother   . Hypertension Mother     Social History Social History   Tobacco Use  . Smoking status: Never Smoker  . Smokeless tobacco: Never Used  Substance Use Topics  . Alcohol use: No  . Drug use: No     Allergies   Patient has no known allergies.   Review of Systems Review of Systems  Constitutional: Negative for chills and fever.  HENT: Negative for sore throat and tinnitus.   Eyes: Negative for redness.  Respiratory: Negative for cough and shortness of breath.   Cardiovascular: Negative for chest pain and palpitations.  Gastrointestinal: Negative for abdominal pain, diarrhea, nausea and vomiting.  Genitourinary: Negative for dysuria, frequency and urgency.  Musculoskeletal: Negative for myalgias.  Skin: Negative for rash.       No lesions  Neurological: Positive for headaches. Negative for weakness.  Hematological: Does not bruise/bleed easily.  Psychiatric/Behavioral: Negative for suicidal ideas.     Physical Exam Triage Vital Signs ED Triage Vitals  Enc Vitals Group     BP 12/08/17 1855 (!) 124/56     Pulse Rate 12/08/17 1855 74     Resp 12/08/17 1855 16     Temp 12/08/17 1855 98.7 F (37.1 C)     Temp src --      SpO2 12/08/17 1855 98 %     Weight --      Height --      Head Circumference --      Peak Flow --      Pain Score 12/08/17 2024 4     Pain Loc --      Pain Edu? --      Excl. in [place]? --    No data  found.  Updated Vital Signs BP (!) 124/56   Pulse 74   Temp 98.7 F (37.1 C)   Resp 16   SpO2 98%   Visual Acuity Right Eye Distance:   Left Eye Distance:   Bilateral Distance:    Right Eye Near:   Left Eye Near:    Bilateral Near:     Physical Exam  Constitutional: She is oriented to person, place, and time. She appears well-developed and well-nourished. No distress.  HENT:  Head: Normocephalic and atraumatic.  Mouth/Throat: Oropharynx is clear and moist.  Eyes: Pupils are equal, round, and reactive to light. Conjunctivae and EOM are normal. No scleral icterus.  Neck: Normal range of motion. Neck supple. No JVD present. No tracheal deviation present. No thyromegaly present.  Cardiovascular: Normal rate, regular rhythm and normal heart sounds. Exam reveals no gallop and no friction rub.  No murmur heard. Pulmonary/Chest: Effort normal and breath sounds normal.  Abdominal: Soft. Bowel sounds are normal. She exhibits no distension. There is no tenderness.  Musculoskeletal: Normal range of motion. She exhibits no edema.  Lymphadenopathy:    She has no cervical adenopathy.  Neurological: She is alert and oriented to person, place, and time. No cranial nerve deficit.  Skin: Skin is warm and dry.  Psychiatric: She has a normal mood and affect. Her behavior is normal. Judgment and thought content normal.  Nursing note and vitals reviewed.    UC Treatments / Results  Labs (all labs ordered are listed, but only abnormal results are displayed) Labs Reviewed - No data to display  EKG None  Radiology No results found.  Procedures Procedures (including critical care time)  Medications Ordered in UC Medications - No data to display  Initial Impression / Assessment and Plan / UC Course  I have reviewed the triage vital signs and the nursing notes.  Pertinent labs & imaging results that were available during my care of the patient were reviewed by me and considered in my  medical decision making (see chart for details).     Occipital headaches consistent with chronic tension headaches.  No red flag symptoms right now.  Discussed Chiari malformation and reassured the patient this is not life-threatening at this time. Rx atenolol  Final Clinical Impressions(s) / UC Diagnoses   Final diagnoses:  Headache disorder   Discharge Instructions   None  ED Prescriptions    Medication Sig Dispense Auth. Provider   atenolol (TENORMIN) 25 MG tablet Take 0.5 tablets (12.5 mg total) by mouth 2 (two) times daily. 30 tablet Harrie Foreman, MD     Controlled Substance Prescriptions [place] Controlled Substance Registry consulted? Not Applicable   Harrie Foreman, MD 12/08/17 2049

## 2018-03-09 ENCOUNTER — Ambulatory Visit: Payer: Self-pay | Attending: Family Medicine | Admitting: Family Medicine

## 2018-03-09 ENCOUNTER — Encounter: Payer: Self-pay | Admitting: Family Medicine

## 2018-03-09 VITALS — BP 122/84 | HR 74 | Temp 98.4°F | Resp 17 | Ht 64.0 in | Wt 225.4 lb

## 2018-03-09 DIAGNOSIS — Z79899 Other long term (current) drug therapy: Secondary | ICD-10-CM | POA: Insufficient documentation

## 2018-03-09 DIAGNOSIS — R200 Anesthesia of skin: Secondary | ICD-10-CM | POA: Insufficient documentation

## 2018-03-09 DIAGNOSIS — K219 Gastro-esophageal reflux disease without esophagitis: Secondary | ICD-10-CM | POA: Insufficient documentation

## 2018-03-09 DIAGNOSIS — S46911A Strain of unspecified muscle, fascia and tendon at shoulder and upper arm level, right arm, initial encounter: Secondary | ICD-10-CM

## 2018-03-09 DIAGNOSIS — E669 Obesity, unspecified: Secondary | ICD-10-CM | POA: Insufficient documentation

## 2018-03-09 DIAGNOSIS — G5602 Carpal tunnel syndrome, left upper limb: Secondary | ICD-10-CM | POA: Insufficient documentation

## 2018-03-09 DIAGNOSIS — R7303 Prediabetes: Secondary | ICD-10-CM | POA: Insufficient documentation

## 2018-03-09 DIAGNOSIS — G8929 Other chronic pain: Secondary | ICD-10-CM | POA: Insufficient documentation

## 2018-03-09 DIAGNOSIS — M25512 Pain in left shoulder: Secondary | ICD-10-CM | POA: Insufficient documentation

## 2018-03-09 DIAGNOSIS — J4540 Moderate persistent asthma, uncomplicated: Secondary | ICD-10-CM | POA: Insufficient documentation

## 2018-03-09 LAB — POCT GLYCOSYLATED HEMOGLOBIN (HGB A1C): HEMOGLOBIN A1C: 5.8 % — AB (ref 4.0–5.6)

## 2018-03-09 MED ORDER — MELOXICAM 15 MG PO TABS: 15.0000 mg | ORAL_TABLET | Freq: Every day | ORAL | 0 refills | Status: DC | PRN

## 2018-03-09 MED ORDER — PREDNISONE 20 MG PO TABS: 40.0000 mg | ORAL_TABLET | Freq: Every day | ORAL | 0 refills | Status: AC

## 2018-03-09 MED ORDER — GABAPENTIN 100 MG PO CAPS: 100.0000 mg | ORAL_CAPSULE | Freq: Three times a day (TID) | ORAL | 3 refills | Status: DC
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
## 2018-03-09 NOTE — Patient Instructions (Addendum)
Review medication bottles for instruction on taking medication.  Apply for [place] in order to receive referral to orthopedics.     Sndrome del tnel carpiano (Carpal Tunnel Syndrome) El sndrome del tnel carpiano es una afeccin que causa dolor en la mano y en el brazo. El tnel carpiano es un espacio estrecho ubicado en el lado palmar de la [place]. Los movimientos repetidos de la mueca o determinadas enfermedades pueden causar la hinchazn del tnel. Esta hinchazn puede comprimir el nervio principal de la mueca (nervio mediano). CUIDADOS EN EL HOGAR Si tiene una frula:  sela como se lo haya indicado el mdico. Qutesela solamente como se lo haya indicado el mdico.  Afloje la frula si los dedos: ? Se le adormecen y siente hormigueos. ? Se le ponen azulados y fros.  Mantenga la frula limpia y seca. Instrucciones generales  [employer] de venta libre y los recetados solamente como se lo haya indicado el mdico.  Haga reposar la [place] de toda actividad que le cause dolor. Si es necesario, hable con su empleador [employer] cambios que pueden hacerse en su lugar de [place], por ejemplo, usar una almohadilla para apoyar la mueca mientras tipea.  Si se lo indican, aplique hielo sobre la zona dolorida: ? Field seismologist hielo en una bolsa plstica. ? Coloque una [employer] piel y la bolsa de hielo. ? Coloque el hielo durante 110minutos, 2 a 3veces por da.  Concurra a todas las visitas de control como se lo haya indicado el mdico. Esto es importante.  Haga los ejercicios como se lo hayan indicado el mdico, el fisioterapeuta o el terapeuta ocupacional. SOLICITE AYUDA SI:  Aparecen nuevos sntomas.  Los medicamentos no Forensic psychologist.  Los sntomas empeoran. Esta informacin no tiene Marine scientist el consejo del mdico. Asegrese de hacerle al mdico cualquier pregunta que tenga. Document Released: 05/14/2011 Document  Revised: 02/13/2015 Document Reviewed: 10/10/2014 Elsevier Interactive Patient Education  2018 [employer].

## 2018-04-15 ENCOUNTER — Emergency Department (HOSPITAL_COMMUNITY)
Admission: EM | Admit: 2018-04-15 | Discharge: 2018-04-16 | Disposition: A | Discharge status: Home / Self Care | Payer: Self-pay | Attending: Emergency Medicine | Admitting: Emergency Medicine

## 2018-04-15 ENCOUNTER — Encounter (HOSPITAL_COMMUNITY): Payer: Self-pay

## 2018-04-15 DIAGNOSIS — Z23 Encounter for immunization: Secondary | ICD-10-CM | POA: Insufficient documentation

## 2018-04-15 DIAGNOSIS — Y939 Activity, unspecified: Secondary | ICD-10-CM | POA: Insufficient documentation

## 2018-04-15 DIAGNOSIS — W2209XA Striking against other stationary object, initial encounter: Secondary | ICD-10-CM | POA: Insufficient documentation

## 2018-04-15 DIAGNOSIS — Y929 Unspecified place or not applicable: Secondary | ICD-10-CM | POA: Insufficient documentation

## 2018-04-15 DIAGNOSIS — S0101XA Laceration without foreign body of scalp, initial encounter: Secondary | ICD-10-CM

## 2018-04-15 DIAGNOSIS — J45909 Unspecified asthma, uncomplicated: Secondary | ICD-10-CM | POA: Insufficient documentation

## 2018-04-15 DIAGNOSIS — Z79899 Other long term (current) drug therapy: Secondary | ICD-10-CM | POA: Insufficient documentation

## 2018-04-15 DIAGNOSIS — S098XXA Other specified injuries of head, initial encounter: Secondary | ICD-10-CM

## 2018-04-15 DIAGNOSIS — Y999 Unspecified external cause status: Secondary | ICD-10-CM | POA: Insufficient documentation

## 2018-04-15 MED ORDER — TETANUS-DIPHTH-ACELL PERTUSSIS 5-2.5-18.5 LF-MCG/0.5 IM SUSP
0.5000 mL | Freq: Once | INTRAMUSCULAR | Status: AC
  Administered 2018-04-16: 0.5 mL via INTRAMUSCULAR
  Filled 2018-04-15: qty 0.5
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
## 2018-04-15 NOTE — ED Triage Notes (Signed)
Pt is escorted with spouse who states that pt was at work and bent over and when she lifted her head she hit it on a cabinet. Pt denies LOC, or falling but sustained a laceration to the left side of her head bleeding is controlled, pt reports a 9/10 throbbing pain.

## 2018-04-23 ENCOUNTER — Ambulatory Visit (HOSPITAL_COMMUNITY)
Admission: EM | Admit: 2018-04-23 | Discharge: 2018-04-23 | Disposition: A | Discharge status: Home / Self Care | Payer: Self-pay

## 2018-04-23 DIAGNOSIS — S0191XA Laceration without foreign body of unspecified part of head, initial encounter: Secondary | ICD-10-CM

## 2018-04-23 DIAGNOSIS — Z4802 Encounter for removal of sutures: Secondary | ICD-10-CM

## 2018-04-23 NOTE — ED Notes (Signed)
Pt presents to have 3 staples removed from right side of head. No redness, swelling or drainage present. Patient denies any complaints. Tolerated well.

## 2018-06-04 ENCOUNTER — Other Ambulatory Visit: Payer: Self-pay

## 2018-06-04 ENCOUNTER — Encounter (HOSPITAL_COMMUNITY): Payer: Self-pay | Admitting: Emergency Medicine

## 2018-06-04 DIAGNOSIS — M542 Cervicalgia: Secondary | ICD-10-CM | POA: Insufficient documentation

## 2018-06-04 DIAGNOSIS — G44209 Tension-type headache, unspecified, not intractable: Secondary | ICD-10-CM | POA: Insufficient documentation

## 2018-06-04 DIAGNOSIS — J45909 Unspecified asthma, uncomplicated: Secondary | ICD-10-CM | POA: Insufficient documentation

## 2018-06-04 NOTE — ED Triage Notes (Addendum)
Per husband, patient c/o headache to posterior head radiating to neck and shoulders x1 week. Reports nausea. Denies blurred vision and weakness. Patient is spanish speaking. Hx migraine. Reports taking Advil with no relief.

## 2018-06-05 ENCOUNTER — Emergency Department (HOSPITAL_COMMUNITY)
Admission: EM | Admit: 2018-06-05 | Discharge: 2018-06-05 | Disposition: A | Discharge status: Home / Self Care | Payer: Self-pay | Attending: Emergency Medicine | Admitting: Emergency Medicine

## 2018-06-05 DIAGNOSIS — G44209 Tension-type headache, unspecified, not intractable: Secondary | ICD-10-CM

## 2018-06-05 MED ORDER — BUTALBITAL-APAP-CAFFEINE 50-325-40 MG PO TABS: 1.0000 | ORAL_TABLET | Freq: Four times a day (QID) | ORAL | 0 refills | Status: AC | PRN

## 2018-06-05 MED ORDER — DEXAMETHASONE SODIUM PHOSPHATE 10 MG/ML IJ SOLN
10.0000 mg | Freq: Once | INTRAMUSCULAR | Status: AC
  Administered 2018-06-05: 10 mg via INTRAVENOUS
  Filled 2018-06-05: qty 1

## 2018-06-05 MED ORDER — DIPHENHYDRAMINE HCL 50 MG/ML IJ SOLN
25.0000 mg | Freq: Once | INTRAMUSCULAR | Status: AC
  Administered 2018-06-05: 25 mg via INTRAVENOUS
  Filled 2018-06-05: qty 1

## 2018-06-05 MED ORDER — SODIUM CHLORIDE 0.9 % IV BOLUS
1000.0000 mL | Freq: Once | INTRAVENOUS | Status: AC
  Administered 2018-06-05: 1000 mL via INTRAVENOUS

## 2018-06-05 MED ORDER — KETOROLAC TROMETHAMINE 30 MG/ML IJ SOLN
30.0000 mg | Freq: Once | INTRAMUSCULAR | Status: AC
  Administered 2018-06-05: 30 mg via INTRAVENOUS
  Filled 2018-06-05: qty 1

## 2018-06-05 NOTE — Discharge Instructions (Addendum)
Return here as needed. Follow up with a Primary Care Doctor.

## 2018-06-05 NOTE — ED Notes (Signed)
Bed: WA10 Expected date:  Expected time:  Means of arrival:  Comments: 
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
## 2018-08-03 ENCOUNTER — Ambulatory Visit: Payer: Self-pay | Admitting: Family Medicine

## 2018-09-13 ENCOUNTER — Ambulatory Visit (HOSPITAL_COMMUNITY)
Admission: EM | Admit: 2018-09-13 | Discharge: 2018-09-13 | Disposition: A | Discharge status: Home / Self Care | Payer: Self-pay | Attending: Internal Medicine | Admitting: Internal Medicine

## 2018-09-13 ENCOUNTER — Ambulatory Visit (INDEPENDENT_AMBULATORY_CARE_PROVIDER_SITE_OTHER): Payer: Self-pay

## 2018-09-13 ENCOUNTER — Encounter (HOSPITAL_COMMUNITY): Payer: Self-pay

## 2018-09-13 ENCOUNTER — Other Ambulatory Visit: Payer: Self-pay

## 2018-09-13 DIAGNOSIS — S20222A Contusion of left back wall of thorax, initial encounter: Secondary | ICD-10-CM

## 2018-09-13 DIAGNOSIS — S3992XA Unspecified injury of lower back, initial encounter: Secondary | ICD-10-CM

## 2018-09-13 DIAGNOSIS — W1789XA Other fall from one level to another, initial encounter: Secondary | ICD-10-CM

## 2018-09-13 IMAGING — DX LUMBAR SPINE - COMPLETE 4+ VIEW
5 series · 5 of 5 positions shown · non-contrast
Comparison: None.

CLINICAL DATA: Pain following fall

EXAM:
LUMBAR SPINE - COMPLETE 4+ VIEW

[l-spine ap]
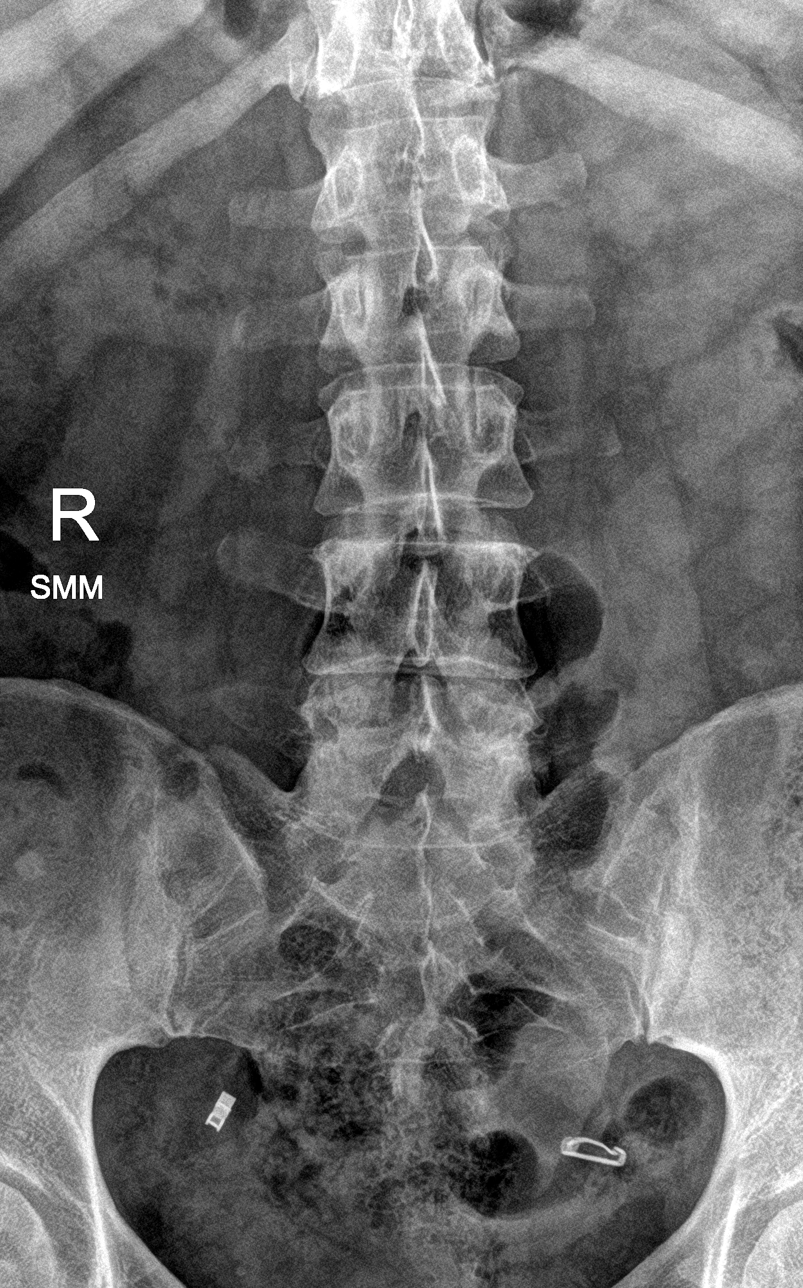

[l-spine obl (1 of 2)]
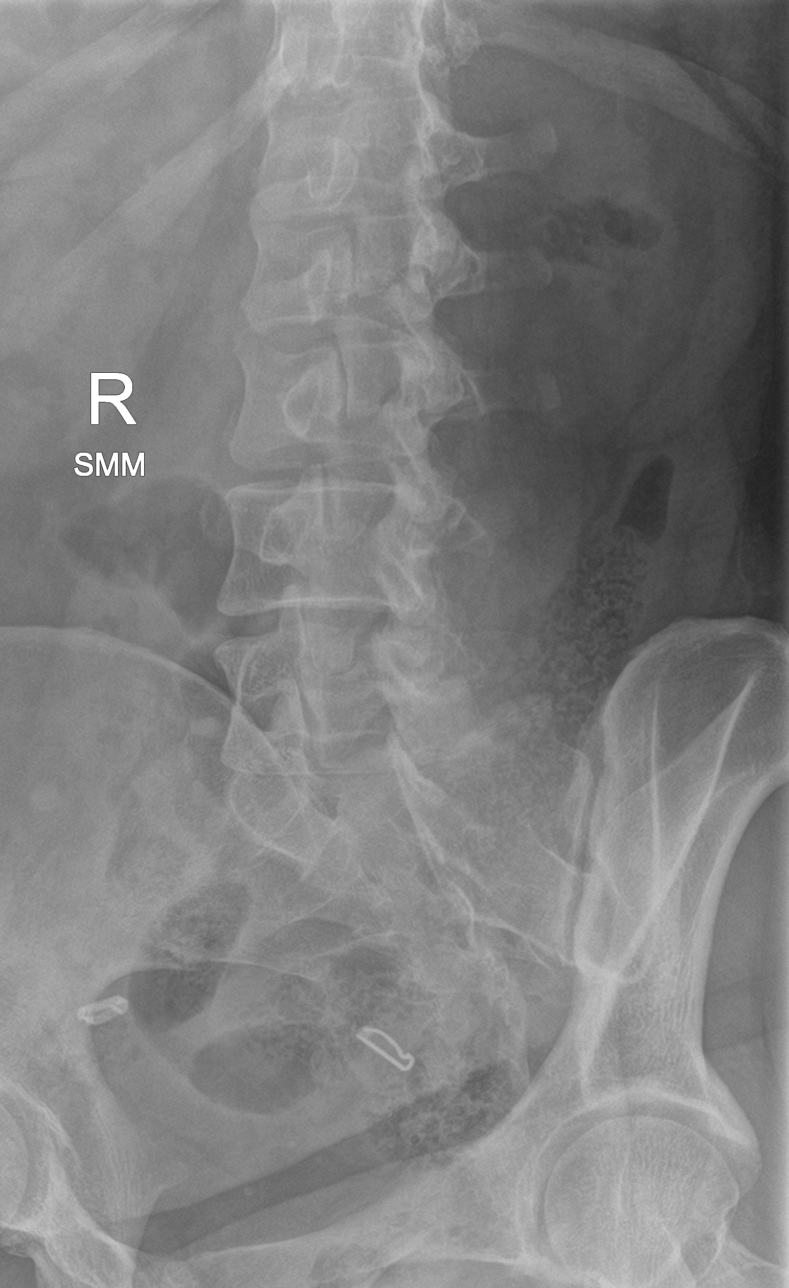

[l-spine obl (2 of 2)]
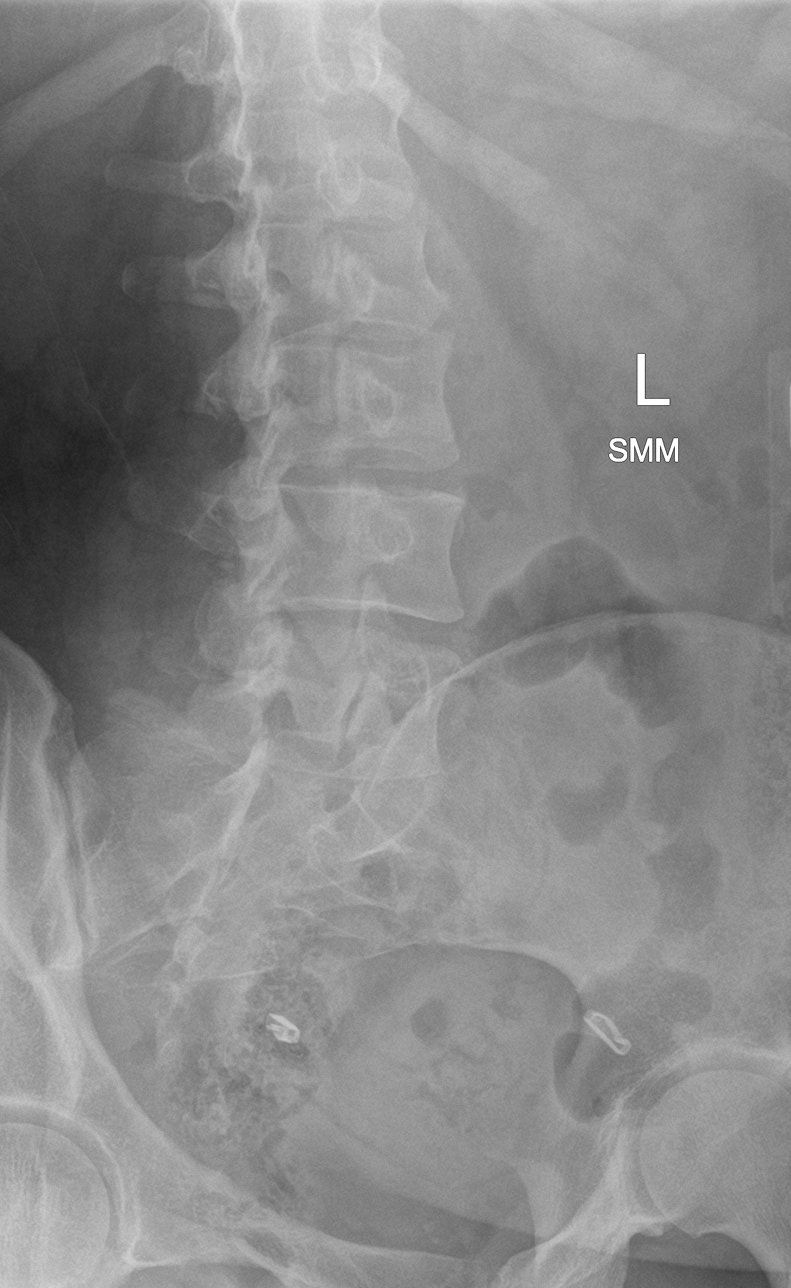

[l-spine lat]
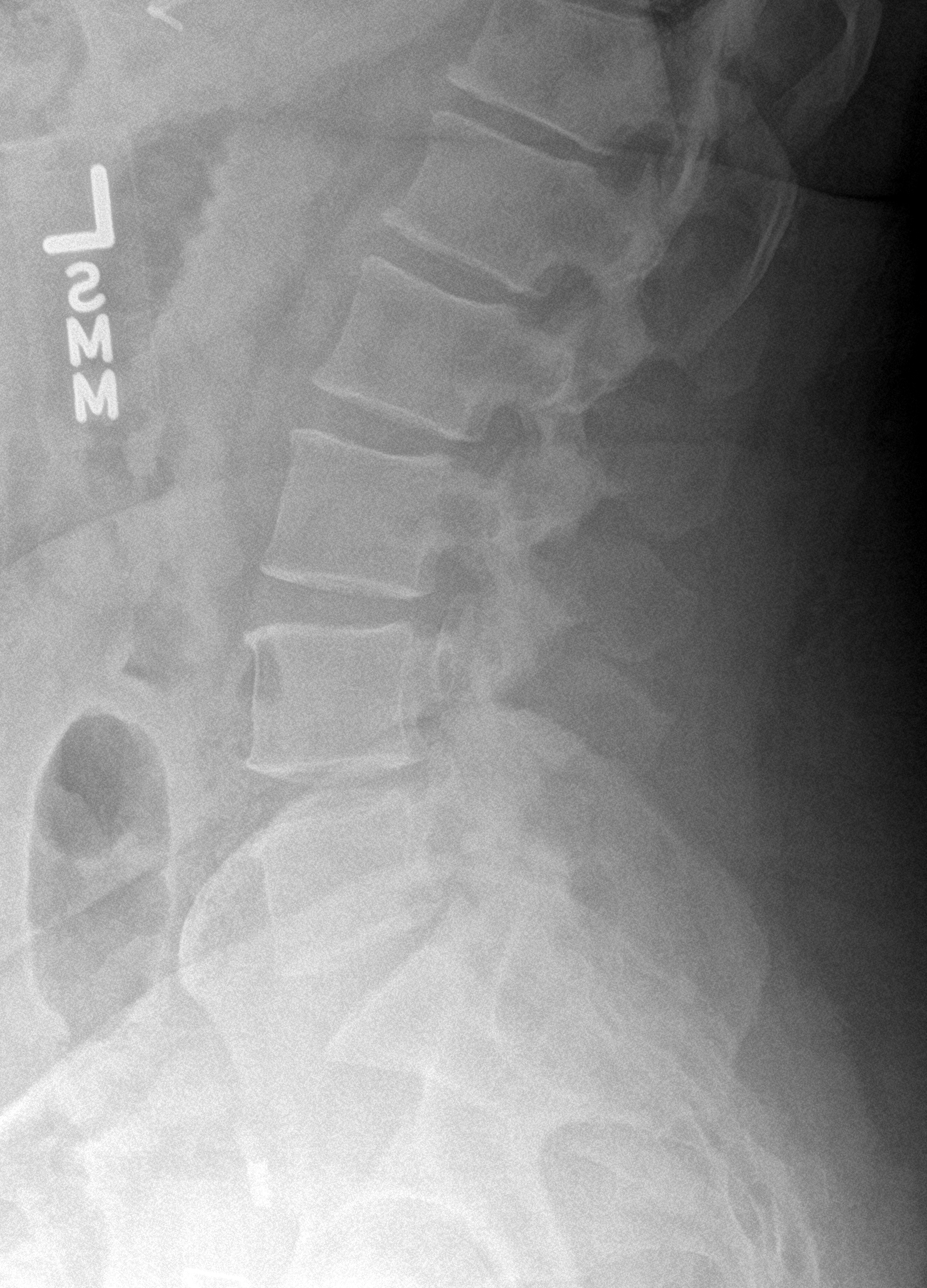

[l-spine spot]
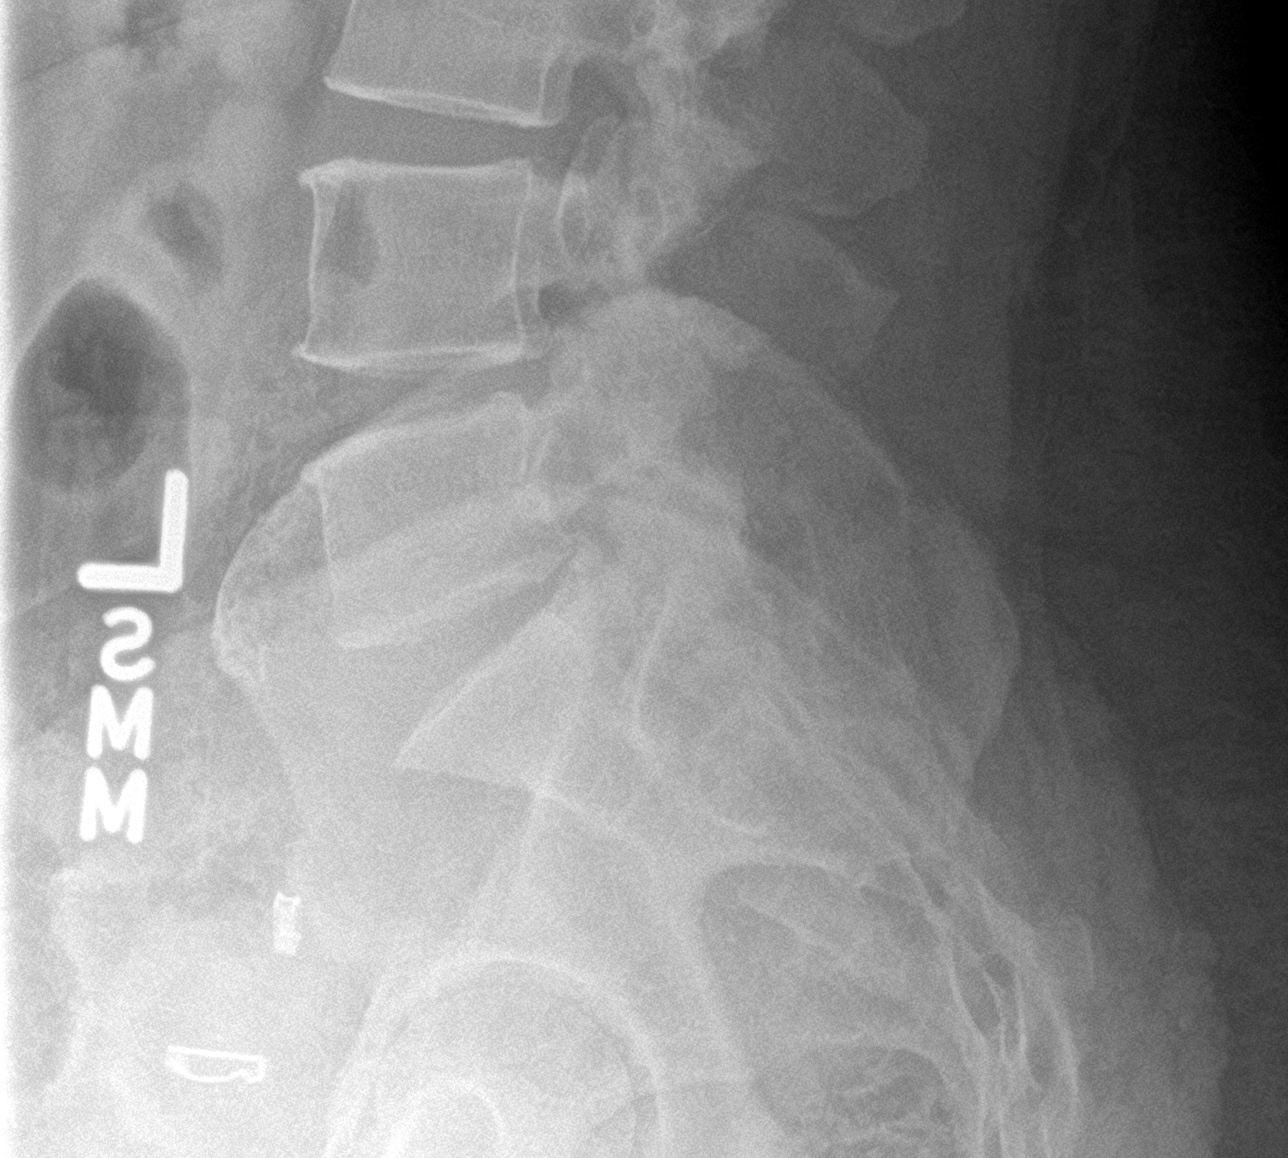

[5 of 5 positions shown; findings below may reference images not displayed]

FINDINGS: Frontal, lateral, spot lumbosacral lateral, and bilateral oblique
views were obtained. There are 5 non-rib-bearing lumbar type
vertebral bodies. There is no fracture or spondylolisthesis. The
disc spaces appear unremarkable. There is no appreciable facet
arthropathy. Tubal ligation clips are noted bilaterally.
IMPRESSION: No fracture or spondylolisthesis.  No appreciable arthropathy.

## 2018-09-13 MED ORDER — IBUPROFEN 800 MG PO TABS: 800.0000 mg | ORAL_TABLET | Freq: Three times a day (TID) | ORAL | 0 refills | Status: DC | PRN

## 2018-09-13 MED ORDER — KETOROLAC TROMETHAMINE 60 MG/2ML IM SOLN
60.0000 mg | Freq: Once | INTRAMUSCULAR | Status: AC
  Administered 2018-09-13: 20:00:00 60 mg via INTRAMUSCULAR

## 2018-09-13 MED ORDER — TRAMADOL HCL 50 MG PO TABS: 50.0000 mg | ORAL_TABLET | Freq: Four times a day (QID) | ORAL | 0 refills | Status: DC | PRN

## 2018-09-13 MED ORDER — KETOROLAC TROMETHAMINE 60 MG/2ML IM SOLN
INTRAMUSCULAR | Status: AC
  Filled 2018-09-13: qty 2

## 2018-09-13 NOTE — ED Provider Notes (Addendum)
[place]    CSN: [id number] Arrival date & time: 09/13/18  1931     History   Chief Complaint No chief complaint on file.   HPI Alexandra Norris is a 43 y.o. female.   DOI today at 11:30 am while at work, when she fell off the kitchen countner on her back and landed on the edge of a marble. She has restrictions to limit use of her hands due to carpal tunnel, but no restrictions to climb. She states she fell when she let go of holding herself to start cleaning the microwave and did not have strength to hold on and catch herself. She did not hit her head and denies neck pain.  Pain is sharp and radiates to L buttocks and L lower abdomen. Denies hitting her head, but did hit her L posterior  uper arm and L wrist which is a little sore. Her pain is constants and gets worse with palpation and movement. Denies paresthesia.  She tried to call the W/C department tp report her injury, but no one answered the phone.     Past Medical History:  Diagnosis Date  . Asthma   . History of blood transfusion 1999   w/vaginal delivery  . History of bronchitis    "I've stayed in hospital 2 X w/bronchitis"  . Migraine     Patient Active Problem List   Diagnosis Date Noted  . Tracheobronchitis 08/25/2017  . Seasonal allergies 10/08/2016  . Dysmenorrhea 10/08/2016  . Pre-diabetes 10/21/2015  . Bilateral chronic knee pain 09/25/2015  . GASTROESOPHAGEAL REFLUX DISEASE 08/14/2009  . DEPRESSION, SITUATIONAL, PROLONGED 02/05/2009  . UNSPECIFIED VISUAL LOSS 11/27/2008  . OBESITY 05/30/2007  . IRREGULAR MENSTRUAL CYCLE 05/30/2007  . Moderate persistent asthma with acute exacerbation 12/06/2006    Past Surgical History:  Procedure Laterality Date  . CESAREAN SECTION  1997; 2008  . CHOLECYSTECTOMY  2011  . tonsilletomy    . TUBAL LIGATION  04/2007    OB History    Gravida  3   Para  3   Term  3   Preterm      AB      Living  3     SAB      TAB      Ectopic     Multiple      Live Births               Home Medications    Prior to Admission medications   Medication Sig Start Date End Date Taking? Authorizing Provider  albuterol (PROVENTIL HFA;VENTOLIN HFA) 108 (90 Base) MCG/ACT inhaler Inhale 2 puffs into the lungs every 4 (four) hours as needed for wheezing or shortness of breath. 08/15/17   Joy, Helane Gunther, PA-C  butalbital-acetaminophen-caffeine (FIORICET, ESGIC) [phone] MG tablet Take 1-2 tablets by mouth every 6 (six) hours as needed for headache. 06/05/18 06/05/19  Lawyer, Harrell Gave, PA-C  gabapentin (NEURONTIN) 100 MG capsule Take 1 capsule (100 mg total) by mouth 3 (three) times daily. Patient not taking: Reported on 06/05/2018 03/09/18   Scot Jun, FNP  ibuprofen (ADVIL,MOTRIN) 200 MG tablet Take 400 mg by mouth every 6 (six) hours as needed for headache, mild pain or moderate pain.    [provider]  meloxicam (MOBIC) 15 MG tablet Take 1 tablet (15 mg total) by mouth daily as needed for pain. Patient not taking: Reported on 06/05/2018 03/09/18   Scot Jun, FNP    Family History Family History  Problem Relation Age of Onset  . Diabetes Mother   . Osteoarthritis Mother   . Hypertension Mother     Social History Social History   Tobacco Use  . Smoking status: Never Smoker  . Smokeless tobacco: Never Used  Substance Use Topics  . Alcohol use: No  . Drug use: No     Allergies   Patient has no known allergies.   Review of Systems Review of Systems  Respiratory: Negative for shortness of breath.   Cardiovascular: Negative for chest pain.  Genitourinary: Negative for difficulty urinating and hematuria.  Musculoskeletal: Positive for back pain.  Skin: Negative for color change, pallor, rash and wound.  Neurological: Positive for weakness and numbness.       Of both hands     Physical Exam Triage Vital Signs ED Triage Vitals  Enc Vitals Group     BP --      Pulse Rate 09/13/18 1942  (!) 102     Resp 09/13/18 1942 16     Temp 09/13/18 1942 98 F (36.7 C)     Temp src --      SpO2 09/13/18 1942 96 %     Weight --      Height --      Head Circumference --      Peak Flow --      Pain Score 09/13/18 1948 10     Pain Loc --      Pain Edu? --      Excl. in [place]? --    No data found.  Updated Vital Signs Pulse (!) 102   Temp 98 F (36.7 C)   Resp 16   SpO2 96%   Visual Acuity Right Eye Distance:   Left Eye Distance:   Bilateral Distance:    Right Eye Near:   Left Eye Near:    Bilateral Near:     Physical Exam Vitals signs and nursing note reviewed.  Constitutional:      General: She is in acute distress.     Appearance: She is not ill-appearing, toxic-appearing or diaphoretic.     Comments: She is crying in pain  HENT:     Right Ear: External ear normal.     Left Ear: External ear normal.     Nose: Nose normal.  Eyes:     General: No scleral icterus.    Conjunctiva/sclera: Conjunctivae normal.     Pupils: Pupils are equal, round, and reactive to light.  Neck:     Musculoskeletal: Neck supple. No neck rigidity or muscular tenderness.     Comments: No cervical vertebral spine tenderness.  Pulmonary:     Effort: Pulmonary effort is normal.  Abdominal:     General: Abdomen is flat. Bowel sounds are normal. There is no distension.     Palpations: Abdomen is soft. There is no mass.     Tenderness: There is no abdominal tenderness. There is no right CVA tenderness, left CVA tenderness, guarding or rebound.     Hernia: No hernia is present.  Musculoskeletal:        General: No swelling, tenderness, deformity or signs of injury.     Right lower leg: No edema.     Left lower leg: Edema present.     Comments: Trying ROM of L hip provoked increased pain on her L lower back BACK-  Up on inspection she does not have any abrasions or ecchymosis. Has local tenderness on L lower lumbar region and  L SI, but seems worse on L sacral region. ROM is decreased due  to pain. SLR is neg. Has mild lumbar vertebral tenderness on L3-L5. Thoracic spine was not tender.  Lymphadenopathy:     Cervical: No cervical adenopathy.  Skin:    General: Skin is warm and dry.     Findings: No bruising, erythema or rash.  Neurological:     Mental Status: She is alert and oriented to person, place, and time.     Cranial Nerves: No cranial nerve deficit.     Gait: Gait abnormal.     Deep Tendon Reflexes: Reflexes normal.     Comments: Gait is slow due to pain. She could not resist against my strength with her L leg due to pain on her L back.   Psychiatric:        Mood and Affect: Mood normal.        Behavior: Behavior normal.        Thought Content: Thought content normal.        Judgment: Judgment normal.    UC Treatments / Results  Labs (all labs ordered are listed, but only abnormal results are displayed) Labs Reviewed - No data to display  EKG None  Radiology Preliminary lumbar xray read by me was Negative  Procedures Procedures  Medications Ordered in UC Medications - No data to display  Initial Impression / Assessment and Plan / UC Course  I have reviewed the triage vital signs and the nursing notes. Pertinent imaging results that were available during my care of the patient were reviewed by me and considered in my medical decision making (see chart for details). I advised her to apply ice on area of pain for 20 min every 2 h if she can for 48h, but does not have to be up at night for this. After 48h, may alternate with heat. Needs to FU with workman's comp clinic in 2 days. I gave her rx for tramadol and Ibuprofen. Told to not start the Ibuprofen til tomorrow, but can take Tramadol tonight if needs it. Needs to only do light duty at work tomorrow.   Final Clinical Impressions(s) / UC Diagnoses   Final diagnoses:  None   Discharge Instructions   None    ED Prescriptions    None     Controlled Substance Prescriptions [place] Controlled  Substance Registry consulted?    Shelby Mattocks, Hershal Coria 09/13/18 2036    Rodriguez-Southworth, Sunday Spillers, PA-C 09/16/18 1621

## 2018-09-13 NOTE — ED Triage Notes (Signed)
States fell at work and hit back

## 2018-09-13 NOTE — Discharge Instructions (Addendum)
Use hielo for 20 minutes cada 2 horas for 48 horas, y despues alterna con also caliente.  Tome Ibuprofen 800 mg cada 8 horas para Conservation officer, historic buildings y puede anadir Tramadol si el dolor sigue.   Tienes que [employer] ver a Facilities manager de [place] in 2 dias.   Address: [address], [place], [place] [address] Phone: [phone]

## 2018-12-28 ENCOUNTER — Inpatient Hospital Stay: Payer: Self-pay

## 2019-01-16 ENCOUNTER — Encounter: Payer: Self-pay | Admitting: Family Medicine

## 2019-01-16 ENCOUNTER — Other Ambulatory Visit: Payer: Self-pay

## 2019-01-16 ENCOUNTER — Ambulatory Visit: Payer: Self-pay | Attending: Family Medicine | Admitting: Family Medicine

## 2019-01-16 DIAGNOSIS — G5603 Carpal tunnel syndrome, bilateral upper limbs: Secondary | ICD-10-CM

## 2019-01-16 MED ORDER — PREDNISONE 20 MG PO TABS: 20.0000 mg | ORAL_TABLET | Freq: Every day | ORAL | 0 refills | Status: DC

## 2019-01-16 MED ORDER — GABAPENTIN 300 MG PO CAPS: 300.0000 mg | ORAL_CAPSULE | Freq: Two times a day (BID) | ORAL | 1 refills | Status: DC

## 2019-01-16 NOTE — Progress Notes (Signed)
Patient has been called and DOB has been verified. Patient has been screened and transferred to PCP to start phone visit.     

## 2019-01-16 NOTE — Progress Notes (Signed)
Virtual Visit via Telephone Note  I connected with Alexandra Norris, on 01/16/2019 at 8:37 AM by telephone due to the COVID-19 pandemic and verified that I am speaking with the correct person using two identifiers.   Consent: I discussed the limitations, risks, security and privacy concerns of performing an evaluation and management service by telephone and the availability of in person appointments. I also discussed with the patient that there may be a patient responsible charge related to this service. The patient expressed understanding and agreed to proceed.   Location of Patient: Home  Location of Provider: Clinic   Persons participating in Telemedicine visit: Dianey Suchy Farrington-CMA Dr. Georgia Lopes [phone] - Interpreter    History of Present Illness: 43 year old female seen for a follow up visit. She has a history of bilateral carpal tunnel syndrome and was last seen by a hand specialist Dr Idelia Salm of [facility] and has had injections in the past. Notes from care everywhere reviewed. EMG revealed R carpal tunnel syndrome Unable to undergo surgery as per patient as her Attorneys are fighting for approval for her case. Hands are numb, worse at night, associated reduced hand strength, difficulty combing her hair. Pain in L hand is 6/10, R hand 7-8/10. Symptoms are reduced by elevating her hands or shaking her hands.  Past Medical History:  Diagnosis Date  . Asthma   . History of blood transfusion 1999   w/vaginal delivery  . History of bronchitis    "I've stayed in hospital 2 X w/bronchitis"  . Migraine    No Known Allergies  Current Outpatient Medications on File Prior to Visit  Medication Sig Dispense Refill  . albuterol (PROVENTIL HFA;VENTOLIN HFA) 108 (90 Base) MCG/ACT inhaler Inhale 2 puffs into the lungs every 4 (four) hours as needed for wheezing or shortness of breath. (Patient not taking: Reported on 01/16/2019)  1 Inhaler 0  . butalbital-acetaminophen-caffeine (FIORICET, ESGIC) 50-325-40 MG tablet Take 1-2 tablets by mouth every 6 (six) hours as needed for headache. (Patient not taking: Reported on 01/16/2019) 20 tablet 0  . ibuprofen (ADVIL,MOTRIN) 800 MG tablet Take 1 tablet (800 mg total) by mouth every 8 (eight) hours as needed. (Patient not taking: Reported on 01/16/2019) 30 tablet 0  . traMADol (ULTRAM) 50 MG tablet Take 1 tablet (50 mg total) by mouth every 6 (six) hours as needed. (Patient not taking: Reported on 01/16/2019) 15 tablet 0   No current facility-administered medications on file prior to visit.     Observations/Objective: AAOx3 Not in acute distress  Assessment and Plan: 1. Bilateral carpal tunnel syndrome Advised to use wrist brace Follow up with Hand specialist regarding carpal tunnel release surgery - gabapentin (NEURONTIN) 300 MG capsule; Take 1 capsule (300 mg total) by mouth 2 (two) times daily.  Dispense: 60 capsule; Refill: 1 - predniSONE (DELTASONE) 20 MG tablet; Take 1 tablet (20 mg total) by mouth daily with breakfast.  Dispense: 5 tablet; Refill: 0   Follow Up Instructions: Return in about 3 months (around 04/18/2019) for medical conditions. , PAP smear and labs.   I discussed the assessment and treatment plan with the patient. The patient was provided an opportunity to ask questions and all were answered. The patient agreed with the plan and demonstrated an understanding of the instructions.   The patient was advised to call back or seek an in-person evaluation if the symptoms worsen or if the condition fails to improve as anticipated.     I provided  16 minutes total of non-face-to-face time during this encounter including median intraservice time, reviewing previous notes, labs, imaging, medications, management and patient verbalized understanding.     Charlott Rakes, MD, FAAFP. [facility] and [facility] [place], [place]    01/16/2019, 8:37 AM

## 2019-03-10 ENCOUNTER — Emergency Department (HOSPITAL_COMMUNITY)
Admission: EM | Admit: 2019-03-10 | Discharge: 2019-03-10 | Disposition: A | Discharge status: Home / Self Care | Payer: Self-pay | Attending: Emergency Medicine | Admitting: Emergency Medicine

## 2019-03-10 DIAGNOSIS — N946 Dysmenorrhea, unspecified: Secondary | ICD-10-CM | POA: Insufficient documentation

## 2019-03-10 DIAGNOSIS — J45909 Unspecified asthma, uncomplicated: Secondary | ICD-10-CM | POA: Insufficient documentation

## 2019-03-10 DIAGNOSIS — N809 Endometriosis, unspecified: Secondary | ICD-10-CM | POA: Insufficient documentation

## 2019-03-10 DIAGNOSIS — Z79899 Other long term (current) drug therapy: Secondary | ICD-10-CM | POA: Insufficient documentation

## 2019-03-10 DIAGNOSIS — R102 Pelvic and perineal pain: Secondary | ICD-10-CM

## 2019-03-10 DIAGNOSIS — G8929 Other chronic pain: Secondary | ICD-10-CM | POA: Insufficient documentation

## 2019-03-10 LAB — URINALYSIS, ROUTINE W REFLEX MICROSCOPIC
Bilirubin Urine: NEGATIVE
Glucose, UA: NEGATIVE mg/dL
Ketones, ur: NEGATIVE mg/dL
Leukocytes,Ua: NEGATIVE
Nitrite: NEGATIVE
Protein, ur: 100 mg/dL — AB
Specific Gravity, Urine: 1.018 (ref 1.005–1.030)
pH: 6 (ref 5.0–8.0)

## 2019-03-10 LAB — CBC WITH DIFFERENTIAL/PLATELET
Abs Immature Granulocytes: 0.04 10*3/uL (ref 0.00–0.07)
Basophils Absolute: 0 10*3/uL (ref 0.0–0.1)
Basophils Relative: 0 %
Eosinophils Absolute: 0.3 10*3/uL (ref 0.0–0.5)
Eosinophils Relative: 3 %
HCT: 42.5 % (ref 36.0–46.0)
Hemoglobin: 13.7 g/dL (ref 12.0–15.0)
Immature Granulocytes: 0 %
Lymphocytes Relative: 22 %
Lymphs Abs: 2.5 10*3/uL (ref 0.7–4.0)
MCH: 26.3 pg (ref 26.0–34.0)
MCHC: 32.2 g/dL (ref 30.0–36.0)
MCV: 81.6 fL (ref 80.0–100.0)
Monocytes Absolute: 0.5 10*3/uL (ref 0.1–1.0)
Monocytes Relative: 5 %
Neutro Abs: 7.7 10*3/uL (ref 1.7–7.7)
Neutrophils Relative %: 70 %
Platelets: 320 10*3/uL (ref 150–400)
RBC: 5.21 MIL/uL — ABNORMAL HIGH (ref 3.87–5.11)
RDW: 15.2 % (ref 11.5–15.5)
WBC: 11.1 10*3/uL — ABNORMAL HIGH (ref 4.0–10.5)
nRBC: 0 % (ref 0.0–0.2)

## 2019-03-10 LAB — BASIC METABOLIC PANEL
Anion gap: 11 (ref 5–15)
BUN: 7 mg/dL (ref 6–20)
CO2: 21 mmol/L — ABNORMAL LOW (ref 22–32)
Calcium: 9.3 mg/dL (ref 8.9–10.3)
Chloride: 104 mmol/L (ref 98–111)
Creatinine, Ser: 0.56 mg/dL (ref 0.44–1.00)
GFR calc Af Amer: >60 mL/min (ref >60)
GFR calc non Af Amer: >60 mL/min (ref >60)
Glucose, Bld: 143 mg/dL — ABNORMAL HIGH (ref 70–99)
Potassium: 3.6 mmol/L (ref 3.5–5.1)
Sodium: 136 mmol/L (ref 135–145)

## 2019-03-10 LAB — TYPE AND SCREEN
ABO/RH(D): O POS
Antibody Screen: POSITIVE
PT AG Type: NEGATIVE

## 2019-03-10 MED ORDER — ACETAMINOPHEN 500 MG PO TABS
1000.0000 mg | ORAL_TABLET | Freq: Once | ORAL | Status: AC
  Administered 2019-03-10: 1000 mg via ORAL
  Filled 2019-03-10: qty 2

## 2019-03-10 MED ORDER — NAPROXEN 250 MG PO TABS
500.0000 mg | ORAL_TABLET | Freq: Once | ORAL | Status: AC
  Administered 2019-03-10: 500 mg via ORAL
  Filled 2019-03-10: qty 2

## 2019-03-10 MED ORDER — NAPROXEN 500 MG PO TABS: 500.0000 mg | ORAL_TABLET | Freq: Two times a day (BID) | ORAL | 0 refills | Status: DC

## 2019-03-10 NOTE — ED Notes (Signed)
Pt walking to restroom now  

## 2019-03-10 NOTE — ED Notes (Signed)
Patient verbalized understanding of discharge instructions and f[facility]u information [facility]) and denies any further needs or questions at this time. VS stable. Patient ambulatory with steady gait.

## 2019-03-10 NOTE — Discharge Instructions (Signed)
You were seen in the ER for chronic pelvic pain and heavy vaginal bleeding.  Your hemoglobin is normal.  No signs of infection in your urine.  Social work has met with you and discussed resources.  I called OB/GYN clinic and they stated that they see patients without insurance and may provide financial assistance.  Call their office and make an appointment as soon as possible to ask about their financial assistance and sliding scale payments.  For pain and inflammation you can use a combination of naproxen and/or acetaminophen.  Take [phone] mg acetaminophen (tylenol) every 6 hours or 500 mg naproxen every 12 hours.  You can take these separately or combine them every 6 hours for maximum pain control. Do not exceed 4,000 mg acetaminophen or 500 mg naproxen in a 24 hour period.  Do not take naproxen containing products if you have history of kidney disease, ulcers, GI bleeding, severe acid reflux, or take a blood thinner.  Do not take acetaminophen if you have liver disease.  Return for fever, worsening abdominal or pelvic pain, heavier vaginal bleeding, chest pain, shortness of breath, light headedness or passing out  Visit la sala de emergencias por dolor plvico crnico y sangrado vaginal. Tu hemoglobina es normal. No hay signos de infeccin en la orina.  La trabajadora de servicios sociales se reuni con usted y discuti los recursos disponible.  Llam a la clnica de obstetricia / ginecologa y me dijeron que atienden a pacientes sin seguro y que pueden proporcionar asistencia financiera. Llame a la oficina de OBGYN y programe una cita lo antes posible para preguntar sobre su asistencia financiera y pagos de escala.  Para el dolor y la inflamacin, puede usar una combinacin de naproxeno y/o Music therapist. Tome [phone] mg de acetaminofn (tylenol) cada 6 horas o 500 mg de naproxeno cada 12 horas. Puede tomarlos por separado o combinarlos cada 6 horas para un control mximo del dolor. No exceda los 4,000 mg  de acetaminofn o 500 mg de naproxeno en un perodo de 24 horas. No tome productos que contengan naproxeno si tiene antecedentes de enfermedad renal, lceras, sangrado gastrointestinal, reflujo cido severo o toma un anticoagulante. No tome acetaminofn si tiene una enfermedad del hgado.  Regrese por fiebre, empeoramiento del dolor abdominal o plvico, sangrado vaginal ms abundante, dolor de pecho, dificultad para respirar, [employer] o [employer]

## 2019-03-10 NOTE — Discharge Planning (Signed)
Arryn Terrones J. Clydene Laming, RN, BSN, [place] [phone]  [facility] set up appointment with [facility] on 03/29/2019 @ 10:45.

## 2019-03-10 NOTE — Discharge Planning (Addendum)
EDCM to follow for disposition needs.  Placed call to [facility] CM, SW and Development worker, community for surgical payment options of uninsured patient.

## 2019-03-10 NOTE — ED Triage Notes (Signed)
Pt to ER for evaluation of lower abdominal pain associated with menstrual cycle. History of endometriosis, reports always has this pain. NAD

## 2019-03-10 NOTE — ED Provider Notes (Signed)
[facility] EMERGENCY DEPARTMENT Provider Note   CSN: [id number] Arrival date & time: 03/10/19  1059     History   Chief Complaint Chief Complaint  Patient presents with   Abdominal Pain    HPI Alexandra Norris is a 43 y.o. female presents to the ER for evaluation of ongoing pelvic pain for the last several years.  Reports moderate to severe cramping to her lower abdomen and sometimes to her lower back that occurs with menstruation.  She has associated heavy vaginal bleeding when she gets her menstrual cycle.  She is currently on day 5 of her current menses.  Reports long history of pelvic pain and extensive work-up at [facility] for this.  She has had vaginal ultrasounds, diagnostic laparoscopy by OB/GYN who diagnosed her with endometriosis.  They have tried treating her symptoms with IUDs, birth control pills but these have not worked.  She is not currently on any treatment for it.  They recommended hysterectomy however due to her lack of insurance patient has been unable to do this.  Reports every 3 weeks she will get her menses and it is usually very painful and associated with heavy vaginal bleeding.  Her current cycle began on Monday and was worse and heavier than previous.  Yesterday she stood up and became very lightheaded and fell down bumping the back of her head.  She denies any loss of consciousness, significant headache afterwards.  No anticoagulation.  No associated chest pain or shortness of breath, syncope.  States currently she lives in [place] and is hoping that she can find some type of assistance to help her with her ongoing care for endometriosis.  She is currently unemployed.  No alleviating or aggravating factors.  She denies any fevers, current abdominal or pelvic pain, dysuria, abnormal vaginal discharge.  States her vaginal bleeding from her menses seems to have slowed down and thinks will probably be done in the next day or 2.  She has gone through  3 pads in the last 24 hours.     HPI  Past Medical History:  Diagnosis Date   Asthma    History of blood transfusion 1999   w/vaginal delivery   History of bronchitis    "I've stayed in hospital 2 X w/bronchitis"   Migraine     Patient Active Problem List   Diagnosis Date Noted   Tracheobronchitis 08/25/2017   Seasonal allergies 10/08/2016   Dysmenorrhea 10/08/2016   Pre-diabetes 10/21/2015   Bilateral chronic knee pain 09/25/2015   GASTROESOPHAGEAL REFLUX DISEASE 08/14/2009   DEPRESSION, SITUATIONAL, PROLONGED 02/05/2009   UNSPECIFIED VISUAL LOSS 11/27/2008   OBESITY 05/30/2007   IRREGULAR MENSTRUAL CYCLE 05/30/2007   Moderate persistent asthma with acute exacerbation 12/06/2006    Past Surgical History:  Procedure Laterality Date   CESAREAN SECTION  1997; 2008   CHOLECYSTECTOMY  2011   tonsilletomy     TUBAL LIGATION  04/2007     OB History    Gravida  3   Para  3   Term  3   Preterm      AB      Living  3     SAB      TAB      Ectopic      Multiple      Live Births               Home Medications    Prior to Admission medications   Medication Sig Start Date  End Date Taking? Authorizing Provider  albuterol (PROVENTIL HFA;VENTOLIN HFA) 108 (90 Base) MCG/ACT inhaler Inhale 2 puffs into the lungs every 4 (four) hours as needed for wheezing or shortness of breath. Patient not taking: Reported on 01/16/2019 08/15/17   Joy, Raquel Sarna C, PA-C  butalbital-acetaminophen-caffeine (FIORICET, ESGIC) [phone] MG tablet Take 1-2 tablets by mouth every 6 (six) hours as needed for headache. Patient not taking: Reported on 01/16/2019 06/05/18 06/05/19  Dalia Heading, PA-C  gabapentin (NEURONTIN) 300 MG capsule Take 1 capsule (300 mg total) by mouth 2 (two) times daily. 01/16/19   Charlott Rakes, MD  ibuprofen (ADVIL,MOTRIN) 800 MG tablet Take 1 tablet (800 mg total) by mouth every 8 (eight) hours as needed. Patient not taking: Reported  on 01/16/2019 09/13/18   Rodriguez-Southworth, Sunday Spillers, PA-C  naproxen (NAPROSYN) 500 MG tablet Take 1 tablet (500 mg total) by mouth 2 (two) times daily. 03/10/19   Kinnie Feil, PA-C  predniSONE (DELTASONE) 20 MG tablet Take 1 tablet (20 mg total) by mouth daily with breakfast. 01/16/19   Charlott Rakes, MD  traMADol (ULTRAM) 50 MG tablet Take 1 tablet (50 mg total) by mouth every 6 (six) hours as needed. Patient not taking: Reported on 01/16/2019 09/13/18   Rodriguez-Southworth, Sunday Spillers, PA-C    Family History Family History  Problem Relation Age of Onset   Diabetes Mother    Osteoarthritis Mother    Hypertension Mother     Social History Social History   Tobacco Use   Smoking status: Never Smoker   Smokeless tobacco: Never Used  Substance Use Topics   Alcohol use: No   Drug use: No     Allergies   Patient has no known allergies.   Review of Systems Review of Systems  Genitourinary: Positive for pelvic pain and vaginal bleeding.  Neurological: Positive for light-headedness.  All other systems reviewed and are negative.    Physical Exam Updated Vital Signs BP 125/65 (BP Location: Left Arm)    Pulse 84    Temp 98.9 F (37.2 C) (Oral)    Resp 18    LMP 03/06/2019    SpO2 97%   Physical Exam Vitals signs and nursing note reviewed.  Constitutional:      Appearance: She is well-developed.     Comments: Non toxic in NAD  HENT:     Head: Normocephalic and atraumatic.     Comments: No contusion, hematoma or signs of trauma to the scalp.    Nose: Nose normal.  Eyes:     Conjunctiva/sclera: Conjunctivae normal.  Neck:     Musculoskeletal: Normal range of motion.     Comments: No midline spinous tenderness.  Forward motion of the neck without pain. Cardiovascular:     Rate and Rhythm: Normal rate and regular rhythm.  Pulmonary:     Effort: Pulmonary effort is normal.     Breath sounds: Normal breath sounds.  Abdominal:     General: Bowel sounds are normal.       Palpations: Abdomen is soft.     Tenderness: There is no abdominal tenderness.     Comments: No G/R/R. No suprapubic or CVA tenderness. Negative Murphy's and McBurney's. Active BS to lower quadrants.   Musculoskeletal: Normal range of motion.  Skin:    General: Skin is warm and dry.     Capillary Refill: Capillary refill takes less than 2 seconds.  Neurological:     Mental Status: She is alert.  Psychiatric:        Behavior:  Behavior normal.      ED Treatments / Results  Labs (all labs ordered are listed, but only abnormal results are displayed) Labs Reviewed  CBC WITH DIFFERENTIAL/PLATELET - Abnormal; Notable for the following components:      Result Value   WBC 11.1 (*)    RBC 5.21 (*)    All other components within normal limits  BASIC METABOLIC PANEL - Abnormal; Notable for the following components:   CO2 21 (*)    Glucose, Bld 143 (*)    All other components within normal limits  URINALYSIS, ROUTINE W REFLEX MICROSCOPIC - Abnormal; Notable for the following components:   Color, Urine AMBER (*)    APPearance CLOUDY (*)    Hgb urine dipstick LARGE (*)    Protein, ur 100 (*)    Bacteria, UA MANY (*)    All other components within normal limits  TYPE AND SCREEN    EKG None  Radiology No results found.  Procedures Procedures (including critical care time)  Medications Ordered in ED Medications  naproxen (NAPROSYN) tablet 500 mg (500 mg Oral Given 03/10/19 1514)  acetaminophen (TYLENOL) tablet 1,000 mg (1,000 mg Oral Given 03/10/19 1514)     Initial Impression / Assessment and Plan / ED Course  I have reviewed the triage vital signs and the nursing notes.  Pertinent labs & imaging results that were available during my care of the patient were reviewed by me and considered in my medical decision making (see chart for details).  Patient's chart reviewed.  She has been seen by [facility] for ongoing pelvic pain she has had transvaginal ultrasounds.  I  do not see any type of diagnostic laparoscopy but she states this was done and she was diagnosed with endometriosis.  Today she presents for acute on chronic pelvic pain and heavier than normal vaginal bleeding during her menstrual cycle, in setting of known dysmenorrhea and menorrhagia.  Currently does not have any pelvic pain and states her vaginal bleeding is slowing down.  Exam is benign, she has no lower abdominal tenderness.  Hemodynamically stable.  Ambulatory here without any lightheadedness.  Lab work reviewed by me, hemoglobin is normal.  Urinalysis without signs of infection.  Since she has no pelvic pain and her vaginal bleeding has slowed down significantly I do not think a pelvic exam is emergently indicated.    Discussed with patient symptoms likely from endometriosis but doubt acute life-threatening intra-abdominal/pelvic pathology such as ovarian torsion, PID unlikely.  I spoke to case management who is met with patient and provided resources.  I also called [facility] to ask if they provide financial services for patients but she was not sure.  [facility] on [address] does see patients without insurance.  Will discharge patient with naproxen and recommendation to follow-up with OB/GYN at the [facility].  Social work has given patient resources.  Return precautions given.  Final Clinical Impressions(s) / ED Diagnoses   Final diagnoses:  Chronic pelvic pain in female  Dysmenorrhea    ED Discharge Orders         Ordered    naproxen (NAPROSYN) 500 MG tablet  2 times daily     03/10/19 1641           Kinnie Feil, [place] 03/10/19 1641    Veryl Speak, MD 03/12/19 2327

## 2019-03-29 ENCOUNTER — Other Ambulatory Visit: Payer: Self-pay

## 2019-03-29 ENCOUNTER — Encounter: Payer: Self-pay | Admitting: Family Medicine

## 2019-03-29 ENCOUNTER — Ambulatory Visit: Payer: Self-pay | Attending: Family Medicine | Admitting: Family Medicine

## 2019-03-29 VITALS — BP 114/79 | HR 75 | Temp 98.4°F | Ht 61.0 in | Wt 240.0 lb

## 2019-03-29 DIAGNOSIS — N809 Endometriosis, unspecified: Secondary | ICD-10-CM

## 2019-03-29 MED ORDER — TRAMADOL HCL 50 MG PO TABS: 50.0000 mg | ORAL_TABLET | Freq: Two times a day (BID) | ORAL | 0 refills | Status: DC | PRN

## 2019-03-29 MED ORDER — NAPROXEN 500 MG PO TABS: 500.0000 mg | ORAL_TABLET | Freq: Two times a day (BID) | ORAL | 0 refills | Status: DC

## 2019-03-29 NOTE — Patient Instructions (Signed)
Endometriosis (Endometriosis) La endometriosis es una enfermedad en la que el tejido que rodea al tero (endometrio) crece fuera de su ubicacin normal. El tejido puede crecer en muchos lugares cerca del tero, pero comnmente crece en los ovarios, las trompas de [place], la vagina o el intestino. Cuando el tero desprende el endometrio en cada ciclo menstrual, hay sangrado en el lugar donde se localiza el tejido endometrial. Esto puede causar dolor porque la sangre es irritante para los tejidos que no estn normalmente expuestos a Librarian, academic. CAUSAS Se desconoce la causa de la endometriosis. FACTORES DE RIESGO Puede tener ms probabilidades de tener endometriosis si:  Tiene antecedentes familiares de endometriosis.  No ha tenido hijos.  Inici su perodo menstrual a los 10aos de edad o menos.  Tiene un alto nivel de estrgeno en el cuerpo.  Estuvo expuesta a cierto medicamento (dietiletilbestrol) antes de Associate Professor (dentro del tero).  Tuvo bajo peso al [employer].  Tiene uno, dos o mltiples hermanos gemelos.  Tiene un [place] inferior a25. El [place] (ndice de masa corporal) es la estimacin de la grasa corporal y se calcula a partir de la altura y [place]. SNTOMAS A menudo, esta afeccin no presenta sntomas. Si tiene sntomas, estos pueden:  Variar segn dnde est creciendo el tejido endometrial.  Ocurrir durante el perodo menstrual (lo ms frecuente) o en la mitad del ciclo.  Ir y venir, o pueden pasar meses sin ningn sntoma.  Desaparecer con la menopausia. Entre los sntomas se pueden incluir los siguientes:  Dolor en la espalda o el abdomen.  Sangrado ms abundante durante los perodos [place].  [place].  Dolor al defecar.  Infertilidad.  Dolor en la pelvis.  Tener ms de una hemorragia al [employer]. DIAGNSTICO Esta afeccin se diagnostica en funcin de los sntomas y de un examen fsico. Pueden hacerle estudios, por ejemplo:  Anlisis de  [place] y [place]. Estos estudios pueden servir para descartar otras causas posibles de sus sntomas.  Ecografa, para buscar tejidos anormales.  Radiografa del recto (enema de bario).  Una ecografa que se realiza a travs de la vagina (transvaginal).  Tomografa computarizada (TC).  Resonancia magntica (RM).  Una laparoscopia. En este procedimiento, se introduce en el abdomen un instrumento del tamao de un lpiz que tiene una luz, llamado laparoscopio, a travs de una incisin. El laparoscopio le permite al mdico observar los rganos internos y Hydrographic surveyor tejidos anormales para confirmar el diagnstico. Si se descubre la presencia de tejidos anormales, el mdico puede extraer una pequea cantidad de tejido (biopsia) para examinarlo con un microscopio. TRATAMIENTO El tratamiento de esta afeccin puede incluir lo siguiente:  Medicamentos para Best boy, por ejemplo, antiinflamatorios no esteroides (AINE).  Terapia hormonal. Esto significa utilizar hormonas artificiales (sintticas) para reducir Arboriculturist del tejido endometrial. El mdico puede recomendarle usar un mtodo anticonceptivo hormonal u otros medicamentos.  Someterse a [place]. Se puede realizar una ciruga para extraer el tejido endometrial anormal. ? En algunos casos, el tejido se puede extraer utilizando un laparoscopio y Marine scientist (tratamiento laparoscpico con Animal nutritionist). ? [employer] son graves, puede realizarse una ciruga para extirpar las trompas de [place], el tero y los ovarios (histerectoma). [place] los medicamentos de venta libre y los recetados solamente como se lo haya indicado el mdico.  No conduzca ni use maquinaria pesada mientras toma analgsicos recetados.  Trate de evitar actividades que produzcan dolor, incluida la actividad sexual.  Dallie Dad a todas  las visitas de control como se lo haya indicado el mdico. Esto es importante. SOLICITE  ATENCIN MDICA SI:  Siente dolor en la zona [employer] caderas (regin plvica), que ocurre: ? Antes, durante o despus del perodo menstrual. ? En medio del perodo y [place] perodo. ? Durante o despus [place]. ? Al defecar u orinar, especialmente durante el perodo menstrual.  Tiene dificultad para quedar embarazada.  Tiene fiebre. SOLICITE ATENCIN MDICA DE INMEDIATO SI:  Tiene un dolor intenso que no mejora con medicamentos.  Tiene nuseas y vmitos intensos, o no puede comer sin vomitar.  Siente un dolor que afecta solo la parte inferior derecha del abdomen.  Tiene un dolor abdominal que empeora.  Tiene hinchazn abdominal.  Observa sangre en la materia fecal. Esta informacin no tiene como fin reemplazar el consejo del mdico. Asegrese de hacerle al mdico cualquier pregunta que tenga. Document Released: 05/25/2005 Document Revised: 04/14/2016 Document Reviewed: 10/26/2015 Elsevier Patient Education  2020 [employer].

## 2019-03-29 NOTE — Progress Notes (Signed)
Subjective:  Patient ID: Alexandra Norris, female    DOB: 1976/04/23  Age: 43 y.o. MRN: [id number]  CC: Hospitalization Follow-up   HPI Alexandra Norris is a 43 year old female with history of endometriosis who presents today with lower abdominal pain which radiates to her upper thighs.  This is typical of her endometriosis symptoms and she was seen for this 2 weeks ago at the ED and prescribed naproxen which has been ineffective. Pain commences when she is about to commence menstrual cycles.  LMP was 03/09/2019. Hysterectomy recommended by GYN however she has been unable to follow through with this due to financial constraints. She endorses the presence of nausea, menorrhagia.  Past Medical History:  Diagnosis Date  . Asthma   . History of blood transfusion 1999   w/vaginal delivery  . History of bronchitis    "I've stayed in hospital 2 X w/bronchitis"  . Migraine     Past Surgical History:  Procedure Laterality Date  . CESAREAN SECTION  1997; 2008  . CHOLECYSTECTOMY  2011  . tonsilletomy    . TUBAL LIGATION  04/2007    Family History  Problem Relation Age of Onset  . Diabetes Mother   . Osteoarthritis Mother   . Hypertension Mother     No Known Allergies  Outpatient Medications Prior to Visit  Medication Sig Dispense Refill  . gabapentin (NEURONTIN) 300 MG capsule Take 1 capsule (300 mg total) by mouth 2 (two) times daily. 60 capsule 1  . albuterol (PROVENTIL HFA;VENTOLIN HFA) 108 (90 Base) MCG/ACT inhaler Inhale 2 puffs into the lungs every 4 (four) hours as needed for wheezing or shortness of breath. (Patient not taking: Reported on 01/16/2019) 1 Inhaler 0  . butalbital-acetaminophen-caffeine (FIORICET, ESGIC) 50-325-40 MG tablet Take 1-2 tablets by mouth every 6 (six) hours as needed for headache. (Patient not taking: Reported on 01/16/2019) 20 tablet 0  . ibuprofen (ADVIL,MOTRIN) 800 MG tablet Take 1 tablet (800 mg total) by mouth every 8 (eight) hours as  needed. (Patient not taking: Reported on 01/16/2019) 30 tablet 0  . predniSONE (DELTASONE) 20 MG tablet Take 1 tablet (20 mg total) by mouth daily with breakfast. (Patient not taking: Reported on 03/29/2019) 5 tablet 0  . naproxen (NAPROSYN) 500 MG tablet Take 1 tablet (500 mg total) by mouth 2 (two) times daily. (Patient not taking: Reported on 03/29/2019) 30 tablet 0  . traMADol (ULTRAM) 50 MG tablet Take 1 tablet (50 mg total) by mouth every 6 (six) hours as needed. (Patient not taking: Reported on 01/16/2019) 15 tablet 0   No facility-administered medications prior to visit.      ROS Review of Systems  Constitutional: Negative for activity change, appetite change and fatigue.  HENT: Negative for congestion, sinus pressure and sore throat.   Eyes: Negative for visual disturbance.  Respiratory: Negative for cough, chest tightness, shortness of breath and wheezing.   Cardiovascular: Negative for chest pain and palpitations.  Gastrointestinal: Positive for abdominal pain. Negative for abdominal distention and constipation.  Endocrine: Negative for polydipsia.  Genitourinary: Negative for dysuria and frequency.  Musculoskeletal: Negative for arthralgias and back pain.  Skin: Negative for rash.  Neurological: Negative for tremors, light-headedness and numbness.  Hematological: Does not bruise/bleed easily.  Psychiatric/Behavioral: Negative for agitation and behavioral problems.    Objective:  BP 114/79   Pulse 75   Temp 98.4 F (36.9 C) (Oral)   Ht 5\' 1"  (1.549 m)   Wt 240 lb (108.9 kg)   LMP 03/06/2019  SpO2 98%   BMI 45.35 kg/m   BP/Weight 03/29/2019 03/10/2019 [id number]  Systolic BP [id number]  Diastolic BP 79 61 73  Wt. (Lbs) 240 - -  BMI 45.35 - 40.62  Some encounter information is confidential and restricted. Go to Review Flowsheets activity to see all data.      Physical Exam Constitutional:      Appearance: She is well-developed.  Neck:     Vascular: No JVD.   Cardiovascular:     Rate and Rhythm: Normal rate.     Heart sounds: Normal heart sounds. No murmur.  Pulmonary:     Effort: Pulmonary effort is normal.     Breath sounds: Normal breath sounds. No wheezing or rales.  Chest:     Chest wall: No tenderness.  Abdominal:     General: Bowel sounds are normal. There is no distension.     Palpations: Abdomen is soft. There is no mass.     Tenderness: There is abdominal tenderness (lower abdominal tenderness).  Musculoskeletal: Normal range of motion.     Right lower leg: No edema.     Left lower leg: No edema.  Neurological:     Mental Status: She is alert and oriented to person, place, and time.  Psychiatric:        Mood and Affect: Mood normal.     CMP Latest Ref Rng & Units 03/10/2019 08/15/2017 03/04/2017  Glucose 70 - 99 mg/dL 143(H) 114(H) 107(H)  BUN 6 - 20 mg/dL 7 6 12   Creatinine 0.44 - 1.00 mg/dL 0.56 0.51 0.47  Sodium 135 - 145 mmol/L 136 136 134(L)  Potassium 3.5 - 5.1 mmol/L 3.6 3.8 4.0  Chloride 98 - 111 mmol/L 104 104 107  CO2 22 - 32 mmol/L 21(L) 22 17(L)  Calcium 8.9 - 10.3 mg/dL 9.3 8.8(L) 8.6(L)  Total Protein 6.5 - 8.1 g/dL - 7.2 7.0  Total Bilirubin 0.3 - 1.2 mg/dL - 0.5 0.8  Alkaline Phos 38 - 126 U/L - 101 68  AST 15 - 41 U/L - 26 19  ALT 14 - 54 U/L - 21 13(L)    Lipid Panel     Component Value Date/Time   CHOL 143 01/03/2013 1603   TRIG 107 01/03/2013 1603   HDL 48 01/03/2013 1603   CHOLHDL 3.0 01/03/2013 1603   VLDL 21 01/03/2013 1603   LDLCALC 74 01/03/2013 1603    CBC    Component Value Date/Time   WBC 11.1 (H) 03/10/2019 1434   RBC 5.21 (H) 03/10/2019 1434   HGB 13.7 03/10/2019 1434   HCT 42.5 03/10/2019 1434   PLT 320 03/10/2019 1434   MCV 81.6 03/10/2019 1434   MCH 26.3 03/10/2019 1434   MCHC 32.2 03/10/2019 1434   RDW 15.2 03/10/2019 1434   LYMPHSABS 2.5 03/10/2019 1434   MONOABS 0.5 03/10/2019 1434   EOSABS 0.3 03/10/2019 1434   BASOSABS 0.0 03/10/2019 1434    Lab Results   Component Value Date   HGBA1C 5.8 (A) 03/09/2018    Assessment & Plan:   1. Endometriosis Uncontrolled Hysterectomy will be definitive treatment Has upcoming appointment with GYN Advised to apply for [employer] discount to facilitate surgery - naproxen (NAPROSYN) 500 MG tablet; Take 1 tablet (500 mg total) by mouth 2 (two) times daily with a meal.  Dispense: 60 tablet; Refill: 0 - traMADol (ULTRAM) 50 MG tablet; Take 1 tablet (50 mg total) by mouth every 12 (twelve) hours as needed.  Dispense: 30 tablet;  Refill: 0    Meds ordered this encounter  Medications  . naproxen (NAPROSYN) 500 MG tablet    Sig: Take 1 tablet (500 mg total) by mouth 2 (two) times daily with a meal.    Dispense:  60 tablet    Refill:  0  . DISCONTD: traMADol (ULTRAM) 50 MG tablet    Sig: Take 1 tablet (50 mg total) by mouth every 12 (twelve) hours as needed.    Dispense:  30 tablet    Refill:  0  . traMADol (ULTRAM) 50 MG tablet    Sig: Take 1 tablet (50 mg total) by mouth every 12 (twelve) hours as needed.    Dispense:  30 tablet    Refill:  0    Follow-up: Return in about 3 months (around 06/29/2019) for coordination of care.       Charlott Rakes, MD, FAAFP. [facility] and [place], [place]   03/29/2019, 11:44 AM

## 2019-03-29 NOTE — Progress Notes (Signed)
Patient is having pain in abdomen, and legs.

## 2019-04-05 ENCOUNTER — Other Ambulatory Visit: Payer: Self-pay

## 2019-04-05 ENCOUNTER — Emergency Department (HOSPITAL_COMMUNITY)
Admission: EM | Admit: 2019-04-05 | Discharge: 2019-04-05 | Disposition: A | Discharge status: Home / Self Care | Payer: Self-pay | Attending: Emergency Medicine | Admitting: Emergency Medicine

## 2019-04-05 DIAGNOSIS — R109 Unspecified abdominal pain: Secondary | ICD-10-CM

## 2019-04-05 DIAGNOSIS — Z5321 Procedure and treatment not carried out due to patient leaving prior to being seen by health care provider: Secondary | ICD-10-CM | POA: Insufficient documentation

## 2019-04-05 LAB — BASIC METABOLIC PANEL
Anion gap: 8 (ref 5–15)
BUN: 10 mg/dL (ref 6–20)
CO2: 25 mmol/L (ref 22–32)
Calcium: 9 mg/dL (ref 8.9–10.3)
Chloride: 105 mmol/L (ref 98–111)
Creatinine, Ser: 0.61 mg/dL (ref 0.44–1.00)
GFR calc Af Amer: >60 mL/min (ref >60)
GFR calc non Af Amer: >60 mL/min (ref >60)
Glucose, Bld: 129 mg/dL — ABNORMAL HIGH (ref 70–99)
Potassium: 4 mmol/L (ref 3.5–5.1)
Sodium: 138 mmol/L (ref 135–145)

## 2019-04-05 LAB — CBC
HCT: 40.1 % (ref 36.0–46.0)
Hemoglobin: 12.3 g/dL (ref 12.0–15.0)
MCH: 25.2 pg — ABNORMAL LOW (ref 26.0–34.0)
MCHC: 30.7 g/dL (ref 30.0–36.0)
MCV: 82.2 fL (ref 80.0–100.0)
Platelets: 299 10*3/uL (ref 150–400)
RBC: 4.88 MIL/uL (ref 3.87–5.11)
RDW: 15 % (ref 11.5–15.5)
WBC: 10.6 10*3/uL — ABNORMAL HIGH (ref 4.0–10.5)
nRBC: 0 % (ref 0.0–0.2)

## 2019-04-05 LAB — I-STAT BETA HCG BLOOD, ED (MC, WL, AP ONLY): I-stat hCG, quantitative: <5 m[IU]/mL (ref <5)

## 2019-04-05 MED ORDER — SODIUM CHLORIDE 0.9% FLUSH: 3.0000 mL | Freq: Once | INTRAVENOUS | Status: DC

## 2019-04-05 NOTE — ED Triage Notes (Signed)
Pt endorses abd pain with n/v and dizziness after eating at [employer] this morning. Axox4. No neuro deficits.

## 2019-04-05 NOTE — ED Provider Notes (Signed)
Patient eloped from the emergency department before provider evaluation. I did not see or evaluate this patient. Triage note sates patient pt here for abdominal pain and nausea and vomiting and dizziness after eating at the [employer] this morning.  She had lab work performed in triage that shows CBC with very mild leukocytosis of 10.6, BMP unremarkable and pregnancy test is negative.  Inform patient left after she had already left the department    Cherre Robins, PA-C 04/05/19 1629    Isla Pence, MD 04/05/19 [place]

## 2019-04-06 ENCOUNTER — Ambulatory Visit: Payer: Self-pay | Attending: Family Medicine

## 2019-04-14 ENCOUNTER — Encounter: Payer: Self-pay | Admitting: Obstetrics and Gynecology

## 2019-04-14 ENCOUNTER — Ambulatory Visit (INDEPENDENT_AMBULATORY_CARE_PROVIDER_SITE_OTHER): Payer: Self-pay | Admitting: Obstetrics and Gynecology

## 2019-04-14 ENCOUNTER — Other Ambulatory Visit: Payer: Self-pay

## 2019-04-14 VITALS — BP 127/85 | HR 83 | Ht 61.0 in | Wt 238.2 lb

## 2019-04-14 DIAGNOSIS — Z1239 Encounter for other screening for malignant neoplasm of breast: Secondary | ICD-10-CM

## 2019-04-14 DIAGNOSIS — Z789 Other specified health status: Secondary | ICD-10-CM

## 2019-04-14 DIAGNOSIS — Z124 Encounter for screening for malignant neoplasm of cervix: Secondary | ICD-10-CM

## 2019-04-14 DIAGNOSIS — M1990 Unspecified osteoarthritis, unspecified site: Secondary | ICD-10-CM | POA: Insufficient documentation

## 2019-04-14 DIAGNOSIS — Z758 Other problems related to medical facilities and other health care: Secondary | ICD-10-CM

## 2019-04-14 DIAGNOSIS — K625 Hemorrhage of anus and rectum: Secondary | ICD-10-CM

## 2019-04-14 DIAGNOSIS — D649 Anemia, unspecified: Secondary | ICD-10-CM | POA: Insufficient documentation

## 2019-04-14 DIAGNOSIS — R102 Pelvic and perineal pain: Secondary | ICD-10-CM

## 2019-04-14 DIAGNOSIS — D508 Other iron deficiency anemias: Secondary | ICD-10-CM

## 2019-04-14 NOTE — Progress Notes (Signed)
GYNECOLOGY OFFICE FOLLOW UP NOTE  History:  43 y.o. [id number] here today for follow up for pelvic pain. States she has been diagnosed with endometriosis and has had severe pain for about 15 years. Has never had surgery to confirm endometriosis.  Reports she has tried IUD, depo, and pills with no improvement. Has tried lupron with no improvement. She reports she has had extensive workup in [place] and was told she should have hysterectomy. However, she has not had insurance so she has not proceeded with the surgery. Has not had diagnostic laparoscopy for suspected endometriosis.   Having periods lasting 6-7 days, periods occur every 3-4 weeks. Very heavy, particularly the first three days. Reports very dark blood.   Reports she also bleeds with her periods from the rectum. Occasionally has constipation but mostly bowel movements are normal. She is sure that her bleeding is coming from the rectum. Reports she sees from bright red blood around stool but also sees some dark black stools. States this only happens the first three days of her period.   H/o 3 CS, with failed TOLAC/uterine rupture in 2nd. Has had BTL with 3rd CS.  Past Medical History:  Diagnosis Date   Anemia    Arthritis    Asthma    Carpal tunnel syndrome    History of blood transfusion 1999   w/vaginal delivery   History of bronchitis    "I've stayed in hospital 2 X w/bronchitis"   Migraine     Past Surgical History:  Procedure Laterality Date   CESAREAN SECTION  1997; 2008   CHOLECYSTECTOMY  2011   tonsilletomy     TUBAL LIGATION  04/2007     Current Outpatient Medications:    gabapentin (NEURONTIN) 300 MG capsule, Take 1 capsule (300 mg total) by mouth 2 (two) times daily., Disp: 60 capsule, Rfl: 1   albuterol (PROVENTIL HFA;VENTOLIN HFA) 108 (90 Base) MCG/ACT inhaler, Inhale 2 puffs into the lungs every 4 (four) hours as needed for wheezing or shortness of breath. (Patient not taking: Reported on  01/16/2019), Disp: 1 Inhaler, Rfl: 0   butalbital-acetaminophen-caffeine (FIORICET, ESGIC) 50-325-40 MG tablet, Take 1-2 tablets by mouth every 6 (six) hours as needed for headache. (Patient not taking: Reported on 01/16/2019), Disp: 20 tablet, Rfl: 0   Elagolix Sodium (ORILISSA) 150 MG TABS, Take 1 tablet by mouth daily., Disp: 30 tablet, Rfl: 2   ibuprofen (ADVIL,MOTRIN) 800 MG tablet, Take 1 tablet (800 mg total) by mouth every 8 (eight) hours as needed. (Patient not taking: Reported on 01/16/2019), Disp: 30 tablet, Rfl: 0   traMADol (ULTRAM) 50 MG tablet, Take 1 tablet (50 mg total) by mouth every 12 (twelve) hours as needed. (Patient not taking: Reported on 04/14/2019), Disp: 30 tablet, Rfl: 0  The following portions of the patient's history were reviewed and updated as appropriate: allergies, current medications, past family history, past medical history, past social history, past surgical history and problem list.   Review of Systems:  Pertinent items noted in HPI and remainder of comprehensive ROS otherwise negative.   Objective:  Physical Exam BP 127/85    Pulse 83    Ht 5\' 1"  (1.549 m)    Wt 238 lb 3.2 oz (108 kg)    LMP 04/05/2019 (Exact Date)    BMI 45.01 kg/m  CONSTITUTIONAL: Well-developed, well-nourished female in no acute distress.  HENT:  Normocephalic, atraumatic. External right and left ear normal. Oropharynx is clear and moist EYES: Conjunctivae and EOM are normal. Pupils  are equal, round, and reactive to light. No scleral icterus.  NECK: Normal range of motion, supple, no masses SKIN: Skin is warm and dry. No rash noted. Not diaphoretic. No erythema. No pallor. NEUROLOGIC: Alert and oriented to person, place, and time. Normal reflexes, muscle tone coordination. No cranial nerve deficit noted. PSYCHIATRIC: Normal mood and affect. Normal behavior. Normal judgment and thought content. CARDIOVASCULAR: Normal heart rate noted RESPIRATORY: Effort normal, no problems with  respiration noted ABDOMEN: Soft, no distention noted.   PELVIC: Normal appearing external genitalia; normal appearing vaginal mucosa and cervix.  No abnormal discharge noted.  Normal uterine size, no other palpable masses, mild diffuse uterine tenderness, no adnexal masses palpable MUSCULOSKELETAL: Normal range of motion. No edema noted.  Exam done with chaperone present.  Labs and Imaging TVUS 06/2018 from [facility] in [place]  UTERUS   Size (cm): Length = 10.5cm. AP = 6.0cm. Width = 5.2cm Position: anteverted Mobility: mobile Endometrium:    Thickness: 12.6 mm   Appearance: symmetric and indistinct margins with spicules into  myometrium Myometrium: diffuse adenomyosis, cesarean scar visualized Fibroids: NONE   Cervix: few nabothian cysts                                                        CUL DE SAC/PELVIS: no free fluid  RIGHT OVARY:  Measurement (cm): 3.5 x 3.2 x 2.3 Volume (ccs): 13.2 Appearance: normal appearance Other:                        LEFT OVARY  Measuremen (cm): 2.3 x 1.8 x 1.4  Volume (ccs): 3.0 Appearance: normal appearance                                     Other:  BLADDER Bilateral ureteral jets visualized.          ~~~~~~~~~~~~~~~~~~~~~~~~~~~~~~~~~~~~~~~~~~~~~~~~~~~~~~~~~~~~~~~~ . IMPRESSION: 1)  Uterus: Indistinct endometrial borders with multiple spicules of  endometrium protruding into myometrium consistent with diffuse  adenomyosis.  2)  Ovaries/adnexa: Ovaries and adnexa normal appearing bilaterally. 3)  Cul de sac: Normal appearing with no free fluid.    4)  Bladder: Bilateral ureteral jets visualized.                    Assessment & Plan:   1. Pelvic pain - Patient has long standing history of pelvic pain, reports diagnosis of endometriosis however, this is not a tissue  diagnosis. Reviewed [place] in [place] from [place], which states she has adenomyosis - reviewed options for improving endometriosis type pain with patient and she reports she has tried them all with no relief. Has significant pain for about two weeks out of the month. She is desirous of hysterectomy (without oophorectomy) as she has been told this is what will fix her pain - reviewed that endometriosis is a difficult diagnosis because she has not had tissue diagnosis and she has not had improvement in pain with any medications attempted thus far - reviewed that if her pain is due to adenomyosis, then hysterectomy would likely improve pain but if she has endometriosis, removal of uterus without removal of ovaries would not necessarily improve her pain - offered trial of Orilissa while patient obtains  insurance, she is agreeable - will obtain pap and return for EMB when she has insurance in place - will discuss surgical options at that time - patient agreeable to plan  2. Other iron deficiency anemia  3. Rectal bleeding - Ambulatory referral to Gastroenterology  4. Language barrier Patent attorney used  5. Cervical cancer screening With no insurance, gave patient option of obtaining pap with free pap clinic, she desires this  6. Encounter for screening for malignant neoplasm of breast, unspecified screening modality Gave info for free mammogram screen   Routine preventative health maintenance measures emphasized. Please refer to After Visit Summary for other counseling recommendations.   Return in about 6 weeks (around 05/26/2019) for Followup GYN.  Total face-to-face time with patient: 30 minutes. Over 50% of encounter was spent on counseling and coordination of care.  Feliz Beam, M.D. Attending Center for [facility] [employer] farm manager)

## 2019-04-15 MED ORDER — ORILISSA 150 MG PO TABS: 1.0000 | ORAL_TABLET | Freq: Every day | ORAL | 2 refills | Status: DC

## 2019-04-17 ENCOUNTER — Ambulatory Visit (HOSPITAL_COMMUNITY)
Admission: EM | Admit: 2019-04-17 | Discharge: 2019-04-17 | Disposition: A | Discharge status: Home / Self Care | Payer: Self-pay | Attending: Family Medicine | Admitting: Family Medicine

## 2019-04-17 ENCOUNTER — Ambulatory Visit (INDEPENDENT_AMBULATORY_CARE_PROVIDER_SITE_OTHER): Payer: Self-pay

## 2019-04-17 ENCOUNTER — Other Ambulatory Visit: Payer: Self-pay

## 2019-04-17 ENCOUNTER — Other Ambulatory Visit (HOSPITAL_COMMUNITY): Payer: Self-pay | Admitting: *Deleted

## 2019-04-17 ENCOUNTER — Encounter (HOSPITAL_COMMUNITY): Payer: Self-pay | Admitting: Emergency Medicine

## 2019-04-17 DIAGNOSIS — Z1231 Encounter for screening mammogram for malignant neoplasm of breast: Secondary | ICD-10-CM

## 2019-04-17 DIAGNOSIS — S93402A Sprain of unspecified ligament of left ankle, initial encounter: Secondary | ICD-10-CM

## 2019-04-17 HISTORY — DX: Endometriosis, unspecified: N80.9

## 2019-04-17 IMAGING — DX DG ANKLE COMPLETE 3+V*L*
3 series · 3 of 3 positions shown · non-contrast
Comparison: None.

CLINICAL DATA: Twisting injury

EXAM:
LEFT ANKLE COMPLETE - 3+ VIEW

[ankle ap]
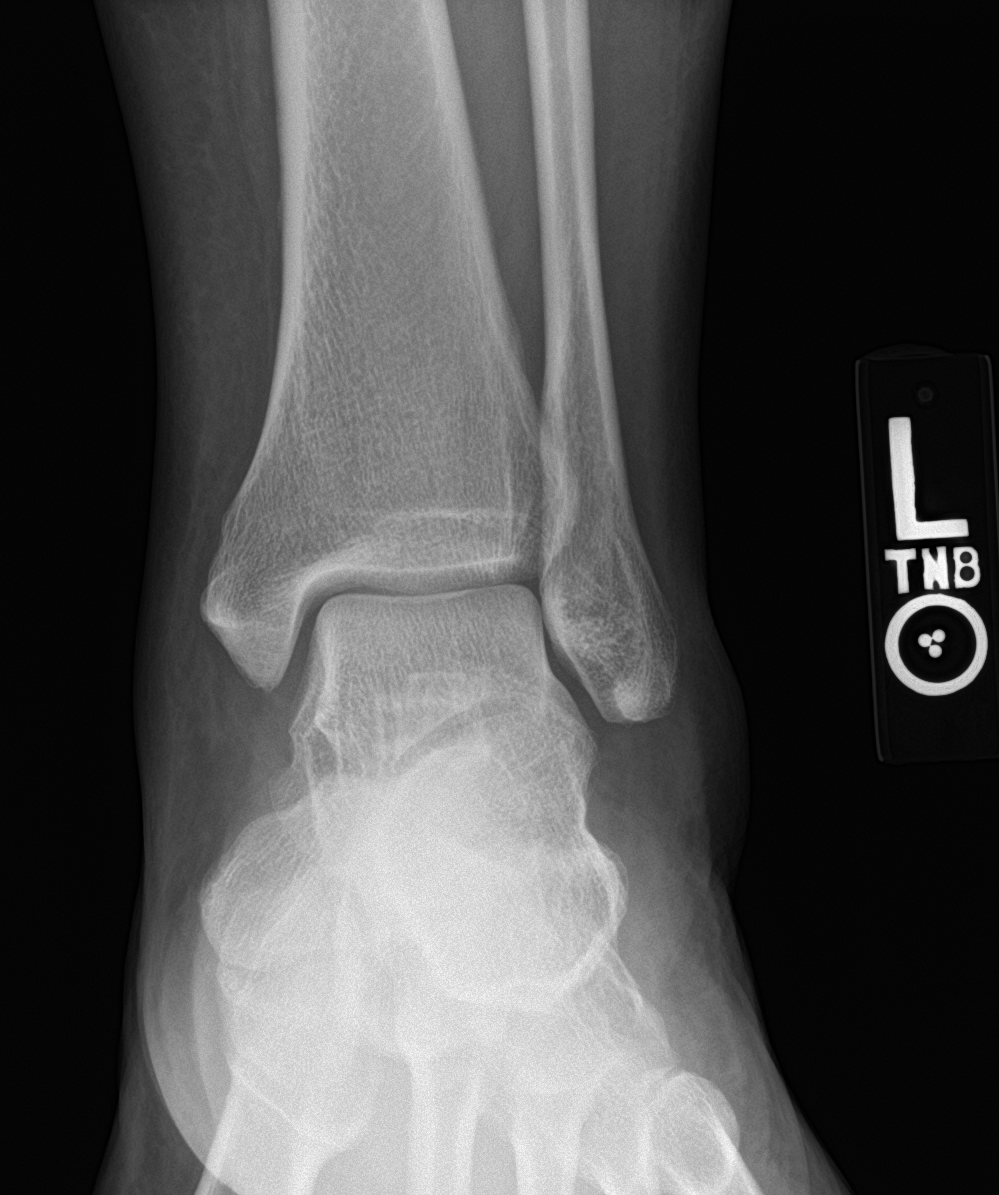

[ankle obl]
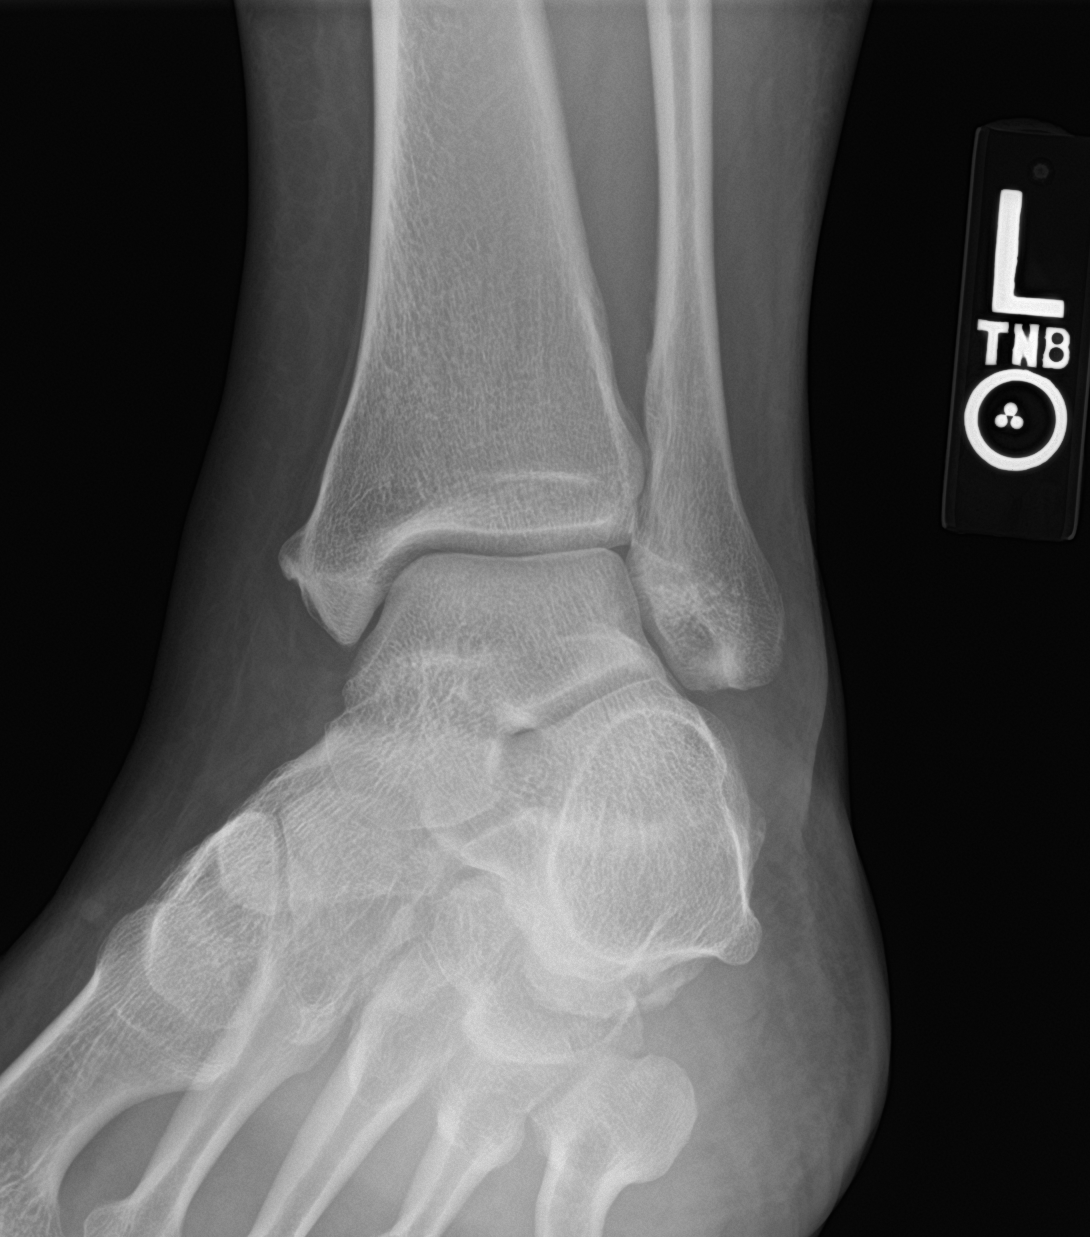

[ankle lat]
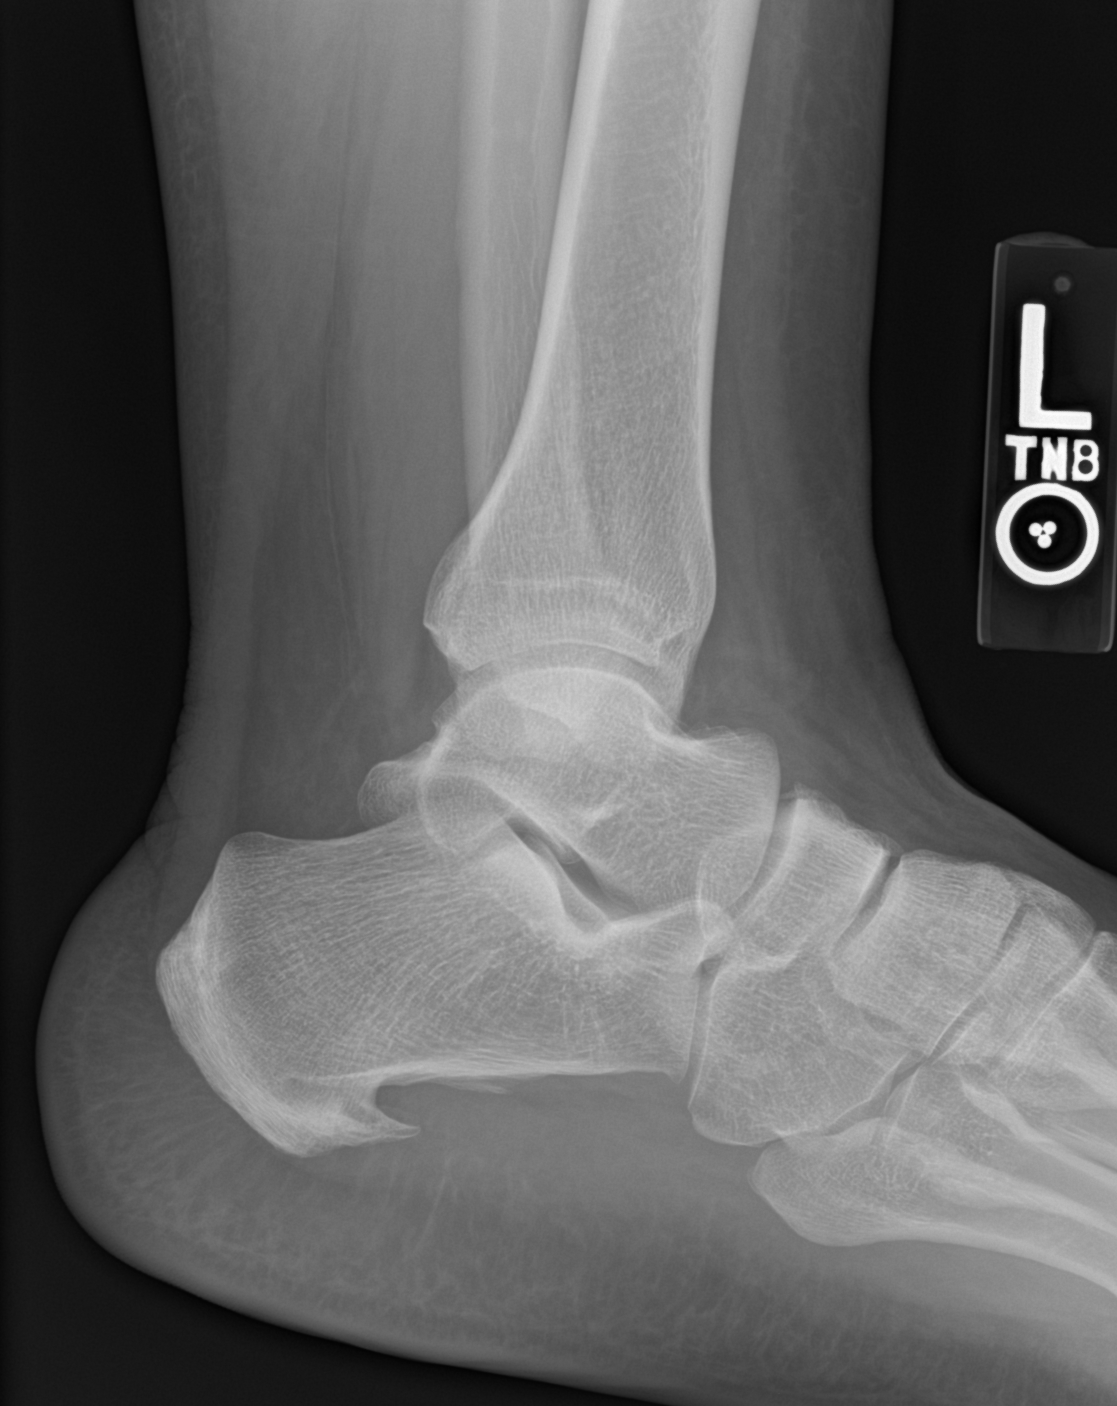

[3 of 3 positions shown; findings below may reference images not displayed]

FINDINGS: No fracture or malalignment. Ankle mortise is symmetric. Moderate
plantar calcaneal spur.
IMPRESSION: No acute osseous abnormality

## 2019-04-17 MED ORDER — ORILISSA 150 MG PO TABS: 1.0000 | ORAL_TABLET | Freq: Every day | ORAL | 2 refills | Status: DC

## 2019-04-17 MED ORDER — IBUPROFEN 800 MG PO TABS: 800.0000 mg | ORAL_TABLET | Freq: Three times a day (TID) | ORAL | 0 refills | Status: DC

## 2019-04-17 NOTE — ED Provider Notes (Signed)
[place]    CSN: [id number] Arrival date & time: 04/17/19  1604      History   Chief Complaint Chief Complaint  Patient presents with  . Fall    HPI Alexandra Norris is a 43 y.o. female.   Alexandra Norris presents with complaints of left ankle pain after a fall this morning. She was stepping down a step, stepped onto a piece of wood which rolled, rolling her ankle and causing her to fall onto her right knee. Pain immediately. Has since been ambulating. Took ibuprofen which has helped. Pain is primarily to left ankle, she has some superficial tenderness to right knee. No significant break in skin to knee but some scratches noted. Pain to ankle is worse with weight bearing, an is a pinching sensation. Denies any previous injury to her ankle. History  Of arthritis, asthma, carpal tunnel, endometriosis, bronchitis, migraines.   Spanish video interpreter used to collect history and physical exam.    ROS per HPI, negative if not otherwise mentioned.              Past Medical History:  Diagnosis Date  . Anemia   . Arthritis   . Asthma   . Carpal tunnel syndrome   . Endometriosis   . History of blood transfusion 1999   w/vaginal delivery  . History of bronchitis    "I've stayed in hospital 2 X w/bronchitis"  . Migraine     Patient Active Problem List   Diagnosis Date Noted  . Arthritis   . Anemia   . Tracheobronchitis 08/25/2017  . Seasonal allergies 10/08/2016  . Dysmenorrhea 10/08/2016  . Pre-diabetes 10/21/2015  . Bilateral chronic knee pain 09/25/2015  . GASTROESOPHAGEAL REFLUX DISEASE 08/14/2009  . DEPRESSION, SITUATIONAL, PROLONGED 02/05/2009  . UNSPECIFIED VISUAL LOSS 11/27/2008  . OBESITY 05/30/2007  . IRREGULAR MENSTRUAL CYCLE 05/30/2007  . Moderate persistent asthma with acute exacerbation 12/06/2006    Past Surgical History:  Procedure Laterality Date  . CESAREAN SECTION  1997; 2008  . CHOLECYSTECTOMY  2011  .  tonsilletomy    . TUBAL LIGATION  04/2007    OB History    Gravida  3   Para  3   Term  3   Preterm      AB      Living  3     SAB      TAB      Ectopic      Multiple      Live Births               Home Medications    Prior to Admission medications   Medication Sig Start Date End Date Taking? Authorizing Provider  NON FORMULARY Taking a medicine for endometriosis  Cannot remember the name of drug   Yes [provider]  albuterol (PROVENTIL HFA;VENTOLIN HFA) 108 (90 Base) MCG/ACT inhaler Inhale 2 puffs into the lungs every 4 (four) hours as needed for wheezing or shortness of breath. Patient not taking: Reported on 01/16/2019 08/15/17   Joy, Raquel Sarna C, PA-C  butalbital-acetaminophen-caffeine (FIORICET, ESGIC) [phone] MG tablet Take 1-2 tablets by mouth every 6 (six) hours as needed for headache. Patient not taking: Reported on 01/16/2019 06/05/18 06/05/19  Dalia Heading, PA-C  Elagolix Sodium (ORILISSA) 150 MG TABS Take 1 tablet by mouth daily. 04/17/19   Sloan Leiter, MD  gabapentin (NEURONTIN) 300 MG capsule Take 1 capsule (300 mg total) by mouth 2 (two) times daily. 01/16/19  Charlott Rakes, MD  ibuprofen (ADVIL) 800 MG tablet Take 1 tablet (800 mg total) by mouth 3 (three) times daily. 04/17/19   Zigmund Gottron, NP  traMADol (ULTRAM) 50 MG tablet Take 1 tablet (50 mg total) by mouth every 12 (twelve) hours as needed. Patient not taking: Reported on 04/14/2019 03/29/19   Charlott Rakes, MD    Family History Family History  Problem Relation Age of Onset  . Diabetes Mother   . Osteoarthritis Mother   . Hypertension Mother     Social History Social History   Tobacco Use  . Smoking status: Never Smoker  . Smokeless tobacco: Never Used  Substance Use Topics  . Alcohol use: No  . Drug use: No     Allergies   Patient has no known allergies.   Review of Systems Review of Systems   Physical Exam Triage Vital Signs ED Triage  Vitals  Enc Vitals Group     BP 04/17/19 1737 114/75     Pulse Rate 04/17/19 1737 98     Resp 04/17/19 1737 18     Temp 04/17/19 1737 98.4 F (36.9 C)     Temp Source 04/17/19 1737 Oral     SpO2 04/17/19 1737 100 %     Weight --      Height --      Head Circumference --      Peak Flow --      Pain Score 04/17/19 1731 6     Pain Loc --      Pain Edu? --      Excl. in [place]? --    No data found.  Updated Vital Signs BP 114/75 (BP Location: Right Arm) Comment (BP Location): large cuff  Pulse 98   Temp 98.4 F (36.9 C) (Oral)   Resp 18   LMP 04/12/2019   SpO2 100%    Physical Exam Constitutional:      General: She is not in acute distress.    Appearance: She is well-developed.  Cardiovascular:     Rate and Rhythm: Normal rate.  Pulmonary:     Effort: Pulmonary effort is normal.  Musculoskeletal:     Left ankle: She exhibits swelling. She exhibits normal range of motion and no ecchymosis. Tenderness. Lateral malleolus tenderness found.     Comments: Left ankle with swelling and tenderness to lateral malleolus and surrounding structures and soft tissues; medial malleolus without pain and foot/toes without pain; strong pulse; still with full ROM although with some pain; right knee with some superficial scratches, not fully abrasions, without impressive tenderness and with full ROM, no pain with weight bearing.   Skin:    General: Skin is warm and dry.  Neurological:     Mental Status: She is alert and oriented to person, place, and time.      UC Treatments / Results  Labs (all labs ordered are listed, but only abnormal results are displayed) Labs Reviewed - No data to display  EKG   Radiology Dg Ankle Complete Left  Result Date: 04/17/2019 CLINICAL DATA:  Twisting injury EXAM: LEFT ANKLE COMPLETE - 3+ VIEW COMPARISON:  None. FINDINGS: No fracture or malalignment. Ankle mortise is symmetric. Moderate plantar calcaneal spur. IMPRESSION: No acute osseous abnormality  Electronically Signed   By: Donavan Foil M.D.   On: 04/17/2019 18:53    Procedures Procedures (including critical care time)  Medications Ordered in UC Medications - No data to display  Initial Impression / Assessment and Plan /  UC Course  I have reviewed the triage vital signs and the nursing notes.  Pertinent labs & imaging results that were available during my care of the patient were reviewed by me and considered in my medical decision making (see chart for details).     History exam and imaging consistent with left ankle sprain. RICE, nsaids for pain recommended. Brace provided. Follow up recommendations provided. Patient verbalized understanding and agreeable to plan.   Final Clinical Impressions(s) / UC Diagnoses   Final diagnoses:  Sprain of left ankle, unspecified ligament, initial encounter     Discharge Instructions     Ice, elevation and brace to help with pain. I Ibuprofen every 8 hours as needed for pain.  Follow up with sports medicine or your primary care provider for any worsening or persistent symptoms.    Hielo, elevacin y refuerzo para ayudar con Conservation officer, historic buildings. yo Ibuprofeno cada 8 horas segn sea necesario para el dolor. Haga un seguimiento con la medicina deportiva o con su proveedor de atencin primaria por cualquier sntoma que empeore o [place].     ED Prescriptions    Medication Sig Dispense Auth. Provider   ibuprofen (ADVIL) 800 MG tablet Take 1 tablet (800 mg total) by mouth 3 (three) times daily. 21 tablet Zigmund Gottron, NP     PDMP not reviewed this encounter.   Zigmund Gottron, NP 04/18/19 [phone]

## 2019-04-17 NOTE — Telephone Encounter (Signed)
Pt reports that the Orlissa medication was to be sent to [facility] due to the medication assistance program that they have.  Orlissa medication e-prescribed as pt requested.    Mel Almond, RN 04/17/19

## 2019-04-17 NOTE — Discharge Instructions (Signed)
Ice, elevation and brace to help with pain. I Ibuprofen every 8 hours as needed for pain.  Follow up with sports medicine or your primary care provider for any worsening or persistent symptoms.    Hielo, elevacin y refuerzo para ayudar con Conservation officer, historic buildings. yo Ibuprofeno cada 8 horas segn sea necesario para el dolor. Haga un seguimiento con la medicina deportiva o con su proveedor de atencin primaria por cualquier sntoma que empeore o [place].

## 2019-04-17 NOTE — ED Triage Notes (Signed)
Stepped on a piece of wood with right foot.  Foot slipped.  Patient then twisted left ankle, and fell onto right knee  [address] outside house on concrete.  Occurred this morning

## 2019-04-19 ENCOUNTER — Encounter: Payer: Self-pay | Admitting: Gastroenterology

## 2019-04-20 ENCOUNTER — Telehealth: Payer: Self-pay | Admitting: Family Medicine

## 2019-04-20 NOTE — Telephone Encounter (Signed)
Pt was sent a letter from financial dept. Inform them, that the application they submitted was incomplete, since they were missing some documentation at the time of the appointment, Pt need to reschedule and resubmit all new papers and application for CAFA and [place], P.S. old documents has been sent back by mail to the Pt and Pt. need to make a new appt. 

## 2019-05-15 ENCOUNTER — Other Ambulatory Visit: Payer: Self-pay

## 2019-05-15 ENCOUNTER — Other Ambulatory Visit: Payer: Self-pay | Admitting: *Deleted

## 2019-05-15 DIAGNOSIS — Z124 Encounter for screening for malignant neoplasm of cervix: Secondary | ICD-10-CM

## 2019-05-15 NOTE — Progress Notes (Signed)
Patient: Alexandra Norris           Date of Birth: 1975/11/29           MRN: [id number] Visit Date: 05/15/2019 PCP: Charlott Rakes, MD Temp: 97.8 Temporal    Cervical Exam Per patient has a history of an abnormal Pap smear 12 years ago that a repeat Pap smear was completed for follow-up. Per patient has had at least three normal Pap smears since. Normal Exam. If today's Pap smear is normal next Pap smear will be due in 3 years. Per patient is going to have a hysterectomy completed next year for AUB. Patient will not need any further Pap smears if she has a hysterectomy for benign reasons per BCCCP and ACOG guidelines.  Patient's History Patient Active Problem List   Diagnosis Date Noted  . Arthritis   . Anemia   . Tracheobronchitis 08/25/2017  . Seasonal allergies 10/08/2016  . Dysmenorrhea 10/08/2016  . Pre-diabetes 10/21/2015  . Bilateral chronic knee pain 09/25/2015  . GASTROESOPHAGEAL REFLUX DISEASE 08/14/2009  . DEPRESSION, SITUATIONAL, PROLONGED 02/05/2009  . UNSPECIFIED VISUAL LOSS 11/27/2008  . OBESITY 05/30/2007  . IRREGULAR MENSTRUAL CYCLE 05/30/2007  . Moderate persistent asthma with acute exacerbation 12/06/2006   Past Medical History:  Diagnosis Date  . Anemia   . Arthritis   . Asthma   . Carpal tunnel syndrome   . Endometriosis   . History of blood transfusion 1999   w/vaginal delivery  . History of bronchitis    "I've stayed in hospital 2 X w/bronchitis"  . Migraine     Family History  Problem Relation Age of Onset  . Diabetes Mother   . Osteoarthritis Mother   . Hypertension Mother     Social History   Occupational History  . Not on file  Tobacco Use  . Smoking status: Never Smoker  . Smokeless tobacco: Never Used  Substance and Sexual Activity  . Alcohol use: No  . Drug use: No  . Sexual activity: Yes    Birth control/protection: None

## 2019-05-19 LAB — CYTOLOGY - PAP
Comment: NEGATIVE
Diagnosis: NEGATIVE
High risk HPV: NEGATIVE

## 2019-05-24 ENCOUNTER — Ambulatory Visit: Payer: Self-pay | Admitting: Gastroenterology

## 2019-05-29 ENCOUNTER — Other Ambulatory Visit: Payer: Self-pay

## 2019-05-29 ENCOUNTER — Ambulatory Visit (INDEPENDENT_AMBULATORY_CARE_PROVIDER_SITE_OTHER): Payer: Self-pay | Admitting: Obstetrics and Gynecology

## 2019-05-29 ENCOUNTER — Encounter: Payer: Self-pay | Admitting: Obstetrics and Gynecology

## 2019-05-29 ENCOUNTER — Encounter (HOSPITAL_COMMUNITY): Payer: Self-pay

## 2019-05-29 ENCOUNTER — Telehealth (HOSPITAL_COMMUNITY): Payer: Self-pay | Admitting: *Deleted

## 2019-05-29 DIAGNOSIS — Z789 Other specified health status: Secondary | ICD-10-CM

## 2019-05-29 DIAGNOSIS — R102 Pelvic and perineal pain: Secondary | ICD-10-CM

## 2019-05-29 DIAGNOSIS — D250 Submucous leiomyoma of uterus: Secondary | ICD-10-CM

## 2019-05-29 DIAGNOSIS — D252 Subserosal leiomyoma of uterus: Secondary | ICD-10-CM

## 2019-05-29 NOTE — Progress Notes (Signed)
TELEHEALTH GYNECOLOGY VIRTUAL VIDEO VISIT ENCOUNTER NOTE  Provider location: [facility] at [place]   I connected with [name] on 05/29/19 at  9:35 AM EST by MyChart Video Encounter at home and verified that I am speaking with the correct person using two identifiers.   I discussed the limitations, risks, security and privacy concerns of performing an evaluation and management service virtually and the availability of in person appointments. I also discussed with the patient that there may be a patient responsible charge related to this service. The patient expressed understanding and agreed to proceed.   History:  Alexandra Norris is a 43 y.o. [id number] female who is here for follow up being evaluated today for pelvic pain. States she is having pelvic pain similar to what she has had in the past. She is having more cramping and about to start her period. This is the same abdominal pain that she has had. Has been told it is endometriosis in the past, but has never had diagnostic laparoscopy. Desires surgical management, had much of her workup done at [facility], records are in [place]. States her pain has been so severe this month that she has not been able to eat, has lost 15 pounds.  Was able to obtain [facility] and start taking it after last visit.  States the [facility] has helped her quite a bit. States her bleeding has been the same but she reports that she has had about a 10% reduction in her pain, which she states she is very happy with.    States she has received the financial letter that her costs for surgery and pre-op appt.    Past Medical History:  Diagnosis Date  . Anemia   . Arthritis   . Asthma   . Carpal tunnel syndrome   . Endometriosis   . History of blood transfusion 1999   w/vaginal delivery  . History of bronchitis    "I've stayed in hospital 2 X w/bronchitis"  . Migraine    Past Surgical History:  Procedure Laterality Date  .  CESAREAN SECTION  1997; 2008  . CHOLECYSTECTOMY  2011  . tonsilletomy    . TUBAL LIGATION  04/2007   The following portions of the patient's history were reviewed and updated as appropriate: allergies, current medications, past family history, past medical history, past social history, past surgical history and problem list.   Review of Systems:  Pertinent items noted in HPI and remainder of comprehensive ROS otherwise negative.  Physical Exam:   General:  Alert, oriented and cooperative. Patient appears to be in no acute distress.  Mental Status: Normal mood and affect. Normal behavior. Normal judgment and thought content.   Respiratory: Normal respiratory effort, no problems with respiration noted  Rest of physical exam deferred due to type of encounter  Labs and Imaging Results for orders placed or performed in visit on 05/15/19 (from the past 336 hour(s))  Cytology - PAP( [place])   Collection Time: 05/15/19  1:46 PM  Result Value Ref Range   High risk HPV Negative    Adequacy      Satisfactory for evaluation; transformation zone component PRESENT.   Diagnosis      - Negative for intraepithelial lesion or malignancy (NILM)   Comment Normal Reference Range HPV - Negative    No results found.     Assessment and Plan:   1. Pelvic pain - cont Orlissa - States pap has already been done, will  need to obtain results - needs EMB - will get set up in office for surgical planning and EMB with partner  2. Submucous and subserous leiomyoma of uterus  3. Language barrier Spanish interpretor used    I discussed the assessment and treatment plan with the patient. The patient was provided an opportunity to ask questions and all were answered. The patient agreed with the plan and demonstrated an understanding of the instructions.   The patient was advised to call back or seek an in-person evaluation/go to the ED if the symptoms worsen or if the condition fails to improve as  anticipated.  I provided 20 minutes of face-to-face time during this encounter.   Sloan Leiter, MD [facility] for [place], [place]

## 2019-05-29 NOTE — Telephone Encounter (Signed)
Pap smear and HPV result letter mailed to patient.

## 2019-06-15 ENCOUNTER — Ambulatory Visit (HOSPITAL_COMMUNITY): Payer: Self-pay

## 2019-06-21 ENCOUNTER — Other Ambulatory Visit (HOSPITAL_COMMUNITY)
Admission: RE | Admit: 2019-06-21 | Discharge: 2019-06-21 | Disposition: A | Discharge status: Home / Self Care | Payer: Self-pay | Source: Ambulatory Visit | Attending: Obstetrics and Gynecology | Admitting: Obstetrics and Gynecology

## 2019-06-21 ENCOUNTER — Other Ambulatory Visit: Payer: Self-pay

## 2019-06-21 ENCOUNTER — Ambulatory Visit (INDEPENDENT_AMBULATORY_CARE_PROVIDER_SITE_OTHER): Payer: Self-pay | Admitting: Obstetrics and Gynecology

## 2019-06-21 ENCOUNTER — Encounter: Payer: Self-pay | Admitting: Obstetrics and Gynecology

## 2019-06-21 VITALS — BP 133/68 | HR 91 | Ht 61.0 in | Wt 231.3 lb

## 2019-06-21 DIAGNOSIS — N926 Irregular menstruation, unspecified: Secondary | ICD-10-CM | POA: Insufficient documentation

## 2019-06-21 LAB — POCT PREGNANCY, URINE: Preg Test, Ur: NEGATIVE

## 2019-06-21 MED ORDER — ORILISSA 150 MG PO TABS: 1.0000 | ORAL_TABLET | Freq: Every day | ORAL | 5 refills | Status: DC

## 2019-06-21 NOTE — Progress Notes (Signed)
Ms Marquis Buggy presents in follow up for [facility]. See prior office notes from Dr. Rosana Hoes.  Normal U/S 1/20 except for adenomyosis.  H/O c section x 3. H/O BTL ? Dx of endometriosis, no tissue Dx. Pt reports was told by U/S results. Has tried several medications for her cycles, as documented in Dr. Shann Medal notes. [place] Coke and this seemed to help but ran out.     ENDOMETRIAL BIOPSY     The indications for endometrial biopsy were reviewed.   Risks of the biopsy including cramping, bleeding, infection, uterine perforation, inadequate specimen and need for additional procedures  were discussed. The patient states she understands and agrees to undergo procedure today. Consent was signed. Time out was performed. Urine HCG was negative. During the pelvic exam, the cervix was prepped with Betadine. A single-toothed tenaculum was placed on the anterior lip of the cervix to stabilize it. The 3 mm pipelle was introduced into the endometrial cavity without difficulty to a depth of 7cm, and a moderate amount of tissue was obtained and sent to pathology. The instruments were removed from the patient's vagina. Minimal bleeding from the cervix was noted. The patient tolerated the procedure well. Routine post-procedure instructions were given to the patient.    Uterus small < 8 weeks, mobile, non tender, no adnexal masses  A/P Menorrhagia        Adenomyosis        ? Endometriosis  EMBX completed today. Will restart Orislia but to elective surgeries postponed due to COVID.  She desires hysterectomy. F/U with me in 3 months.

## 2019-06-21 NOTE — Patient Instructions (Signed)

## 2019-06-23 LAB — SURGICAL PATHOLOGY

## 2019-07-03 ENCOUNTER — Ambulatory Visit: Payer: Self-pay | Attending: Family Medicine | Admitting: Family Medicine

## 2019-07-03 DIAGNOSIS — G5603 Carpal tunnel syndrome, bilateral upper limbs: Secondary | ICD-10-CM

## 2019-07-03 DIAGNOSIS — N809 Endometriosis, unspecified: Secondary | ICD-10-CM

## 2019-07-03 NOTE — Progress Notes (Signed)
Patient has been called and DOB has been verified. Patient has been screened and transferred to PCP to start phone visit.   Pain in both hands.

## 2019-07-03 NOTE — Progress Notes (Signed)
Virtual Visit via Telephone Note  I connected with Alexandra Norris, on 07/03/2019 at 11:16 AM by telephone due to the COVID-19 pandemic and verified that I am speaking with the correct person using two identifiers.   Consent: I discussed the limitations, risks, security and privacy concerns of performing an evaluation and management service by telephone and the availability of in person appointments. I also discussed with the patient that there may be a patient responsible charge related to this service. The patient expressed understanding and agreed to proceed.   Location of Patient: Home  Location of Provider: Clinic   Persons participating in Telemedicine visit: Timmia Vidal Farrington-CMA Dr. Margarita Rana ID [id number] Ginny Forth - Interpreter    History of Present Illness: 44 year old female with a history of bilateral carpal tunnel syndrome, endometriosis who presents today for an acute visit.  Patient complains of pain in hands for the last 2 months and received cortisone injections from Dr Idelia Salm for bilateral carpal tunnel syndrome at [facility] but this has been ineffective.  Notes from care everywhere has been reviewed. Pain is associated with numbness in her hands with associated decreased hand grip; she is right handed.  She is being worked up by [employer] for surgery for her endometriosis after pathology from her endometrial biopsy revealed benign secretory endometrium. She has no additional concerns today. Past Medical History:  Diagnosis Date  . Anemia   . Arthritis   . Asthma   . Carpal tunnel syndrome   . Endometriosis   . History of blood transfusion 1999   w/vaginal delivery  . History of bronchitis    "I've stayed in hospital 2 X w/bronchitis"  . Migraine    No Known Allergies  Current Outpatient Medications on File Prior to Visit  Medication Sig Dispense Refill  . Elagolix Sodium (ORILISSA) 150 MG TABS Take 1 tablet by  mouth daily. (Patient not taking: Reported on 07/03/2019) 30 tablet 5  . ibuprofen (ADVIL) 800 MG tablet Take 1 tablet (800 mg total) by mouth 3 (three) times daily. (Patient not taking: Reported on 07/03/2019) 21 tablet 0  . NON FORMULARY Taking a medicine for endometriosis  Cannot remember the name of drug     No current facility-administered medications on file prior to visit.    Observations/Objective: Awake, alert, oriented x3 Not in acute distress  Assessment and Plan: 1. Bilateral carpal tunnel syndrome Uncontrolled Status post cortisone injections with no relief We will refer to orthopedics Use wrist brace - AMB referral to orthopedics  2. Endometriosis Currently followed by GYN Upcoming surgery scheduled   Follow Up Instructions: As needed   I discussed the assessment and treatment plan with the patient. The patient was provided an opportunity to ask questions and all were answered. The patient agreed with the plan and demonstrated an understanding of the instructions.   The patient was advised to call back or seek an in-person evaluation if the symptoms worsen or if the condition fails to improve as anticipated.     I provided 11 minutes total of non-face-to-face time during this encounter including median intraservice time, reviewing previous notes, investigations, ordering medications, medical decision making, coordinating care and patient verbalized understanding at the end of the visit.     Charlott Rakes, MD, FAAFP. [facility] [place], [place]   07/03/2019, 11:16 AM

## 2019-07-04 ENCOUNTER — Encounter: Payer: Self-pay | Admitting: General Practice

## 2019-07-11 ENCOUNTER — Other Ambulatory Visit: Payer: Self-pay

## 2019-07-11 ENCOUNTER — Ambulatory Visit (INDEPENDENT_AMBULATORY_CARE_PROVIDER_SITE_OTHER): Payer: Self-pay | Admitting: Orthopaedic Surgery

## 2019-07-11 DIAGNOSIS — G5602 Carpal tunnel syndrome, left upper limb: Secondary | ICD-10-CM | POA: Insufficient documentation

## 2019-07-11 DIAGNOSIS — G5601 Carpal tunnel syndrome, right upper limb: Secondary | ICD-10-CM | POA: Insufficient documentation

## 2019-07-11 HISTORY — DX: Carpal tunnel syndrome, right upper limb: G56.01

## 2019-07-11 HISTORY — DX: Carpal tunnel syndrome, left upper limb: G56.02

## 2019-07-11 NOTE — Progress Notes (Signed)
Office Visit Note   Patient: Alexandra Norris           Date of Birth: Apr 01, 1976           MRN: [id number] Visit Date: 07/11/2019              Requested by: Charlott Rakes, MD [place],  [place] [id number] PCP: Charlott Rakes, MD   Assessment & Plan: Visit Diagnoses:  1. Right carpal tunnel syndrome   2. Left carpal tunnel syndrome     Plan: My impression is bilateral carpal tunnel syndrome worse on the right.  Based on our discussion and findings patient has elected to proceed with a right carpal tunnel release.  Risk benefits alternatives and recovery of the recovery were discussed with the patient.  She would like to have this done soon as possible.  She will return for 1 week postop check.  Follow-Up Instructions: Return for 1 week postop visit.   Orders:  No orders of the defined types were placed in this encounter.  No orders of the defined types were placed in this encounter.     Procedures: No procedures performed   Clinical Data: No additional findings.   Subjective: Chief Complaint  Patient presents with  . Left Hand - Pain  . Right Hand - Pain    Patient presents today with interpreter for evaluation of bilateral carpal tunnel syndrome worse on the right.  She has been getting cortisone injections every 3 to 4 months at [facility] but due to changes in health insurance she has started to come see [place].  She did have nerve conduction studies back in 2019 which confirmed carpal tunnel syndrome.  She states that the injection is no longer give her enough relief and now she is interested in surgical treatment.  She has a lot of difficulty at night sleeping and her hands fall asleep during the day.   Review of Systems  Constitutional: Negative.   HENT: Negative.   Eyes: Negative.   Respiratory: Negative.   Cardiovascular: Negative.   Endocrine: Negative.   Musculoskeletal: Negative.   Neurological: Negative.    Hematological: Negative.   Psychiatric/Behavioral: Negative.   All other systems reviewed and are negative.    Objective: Vital Signs: There were no vitals taken for this visit.  Physical Exam Vitals and nursing note reviewed.  Constitutional:      Appearance: She is well-developed.  HENT:     Head: Normocephalic and atraumatic.  Pulmonary:     Effort: Pulmonary effort is normal.  Abdominal:     Palpations: Abdomen is soft.  Musculoskeletal:     Cervical back: Neck supple.  Skin:    General: Skin is warm.     Capillary Refill: Capillary refill takes less than 2 seconds.  Neurological:     Mental Status: She is alert and oriented to person, place, and time.  Psychiatric:        Behavior: Behavior normal.        Thought Content: Thought content normal.        Judgment: Judgment normal.     Ortho Exam Bilateral hand exam shows positive carpal tunnel compressive signs.  No muscle atrophy.  Neurovascular intact.  Weak grip strength secondary to pain and numbness. Specialty Comments:  No specialty comments available.  Imaging: No results found.   PMFS History: Patient Active Problem List   Diagnosis Date Noted  . Right carpal tunnel syndrome 07/11/2019  . Left  carpal tunnel syndrome 07/11/2019  . Arthritis   . Anemia   . Tracheobronchitis 08/25/2017  . Seasonal allergies 10/08/2016  . Dysmenorrhea 10/08/2016  . Pre-diabetes 10/21/2015  . Bilateral chronic knee pain 09/25/2015  . GASTROESOPHAGEAL REFLUX DISEASE 08/14/2009  . DEPRESSION, SITUATIONAL, PROLONGED 02/05/2009  . UNSPECIFIED VISUAL LOSS 11/27/2008  . OBESITY 05/30/2007  . IRREGULAR MENSTRUAL CYCLE 05/30/2007  . Moderate persistent asthma with acute exacerbation 12/06/2006   Past Medical History:  Diagnosis Date  . Anemia   . Arthritis   . Asthma   . Carpal tunnel syndrome   . Endometriosis   . History of blood transfusion 1999   w/vaginal delivery  . History of bronchitis    "I've stayed in  hospital 2 X w/bronchitis"  . Migraine     Family History  Problem Relation Age of Onset  . Diabetes Mother   . Osteoarthritis Mother   . Hypertension Mother     Past Surgical History:  Procedure Laterality Date  . CESAREAN SECTION  1997; 2008  . CHOLECYSTECTOMY  2011  . tonsilletomy    . TUBAL LIGATION  04/2007   Social History   Occupational History  . Not on file  Tobacco Use  . Smoking status: Never Smoker  . Smokeless tobacco: Never Used  Substance and Sexual Activity  . Alcohol use: No  . Drug use: No  . Sexual activity: Yes    Birth control/protection: None

## 2019-07-13 ENCOUNTER — Other Ambulatory Visit: Payer: Self-pay

## 2019-07-13 ENCOUNTER — Encounter (HOSPITAL_BASED_OUTPATIENT_CLINIC_OR_DEPARTMENT_OTHER): Payer: Self-pay | Admitting: Orthopaedic Surgery

## 2019-07-15 ENCOUNTER — Other Ambulatory Visit (HOSPITAL_COMMUNITY)
Admission: RE | Admit: 2019-07-15 | Discharge: 2019-07-15 | Disposition: A | Discharge status: Home / Self Care | Payer: HRSA Program | Source: Ambulatory Visit | Attending: Orthopaedic Surgery | Admitting: Orthopaedic Surgery

## 2019-07-15 DIAGNOSIS — Z01812 Encounter for preprocedural laboratory examination: Secondary | ICD-10-CM | POA: Insufficient documentation

## 2019-07-15 DIAGNOSIS — Z20822 Contact with and (suspected) exposure to covid-19: Secondary | ICD-10-CM | POA: Diagnosis not present

## 2019-07-15 LAB — SARS CORONAVIRUS 2 (TAT 6-24 HRS): SARS Coronavirus 2: NEGATIVE

## 2019-07-19 ENCOUNTER — Ambulatory Visit (HOSPITAL_BASED_OUTPATIENT_CLINIC_OR_DEPARTMENT_OTHER): Payer: Self-pay | Admitting: Certified Registered"

## 2019-07-19 ENCOUNTER — Other Ambulatory Visit: Payer: Self-pay

## 2019-07-19 ENCOUNTER — Encounter (HOSPITAL_BASED_OUTPATIENT_CLINIC_OR_DEPARTMENT_OTHER): Payer: Self-pay | Admitting: Orthopaedic Surgery

## 2019-07-19 ENCOUNTER — Encounter (HOSPITAL_BASED_OUTPATIENT_CLINIC_OR_DEPARTMENT_OTHER)
Admission: RE | Disposition: A | Discharge status: Home / Self Care | Payer: Self-pay | Source: Home / Self Care | Attending: Orthopaedic Surgery

## 2019-07-19 ENCOUNTER — Ambulatory Visit (HOSPITAL_BASED_OUTPATIENT_CLINIC_OR_DEPARTMENT_OTHER)
Admission: RE | Admit: 2019-07-19 | Discharge: 2019-07-19 | Disposition: A | Discharge status: Home / Self Care | Payer: Self-pay | Attending: Orthopaedic Surgery | Admitting: Orthopaedic Surgery

## 2019-07-19 DIAGNOSIS — F329 Major depressive disorder, single episode, unspecified: Secondary | ICD-10-CM | POA: Insufficient documentation

## 2019-07-19 DIAGNOSIS — J45909 Unspecified asthma, uncomplicated: Secondary | ICD-10-CM | POA: Insufficient documentation

## 2019-07-19 DIAGNOSIS — G5601 Carpal tunnel syndrome, right upper limb: Secondary | ICD-10-CM | POA: Diagnosis present

## 2019-07-19 DIAGNOSIS — M1990 Unspecified osteoarthritis, unspecified site: Secondary | ICD-10-CM | POA: Insufficient documentation

## 2019-07-19 DIAGNOSIS — K219 Gastro-esophageal reflux disease without esophagitis: Secondary | ICD-10-CM | POA: Insufficient documentation

## 2019-07-19 DIAGNOSIS — Z9049 Acquired absence of other specified parts of digestive tract: Secondary | ICD-10-CM | POA: Insufficient documentation

## 2019-07-19 HISTORY — PX: CARPAL TUNNEL RELEASE: SHX101

## 2019-07-19 LAB — POCT PREGNANCY, URINE: Preg Test, Ur: NEGATIVE

## 2019-07-19 SURGERY — CARPAL TUNNEL RELEASE
Anesthesia: Monitor Anesthesia Care | Site: Wrist | Laterality: Right

## 2019-07-19 MED ORDER — LACTATED RINGERS IV SOLN: INTRAVENOUS | Status: DC

## 2019-07-19 MED ORDER — HYDROMORPHONE HCL 1 MG/ML IJ SOLN
INTRAMUSCULAR | Status: AC
  Filled 2019-07-19: qty 0.5

## 2019-07-19 MED ORDER — ONDANSETRON HCL 4 MG PO TABS: 4.0000 mg | ORAL_TABLET | Freq: Three times a day (TID) | ORAL | 0 refills | Status: DC | PRN

## 2019-07-19 MED ORDER — LIDOCAINE HCL (PF) 0.5 % IJ SOLN
INTRAMUSCULAR | Status: DC | PRN
  Administered 2019-07-19: 30 mL via INTRAVENOUS

## 2019-07-19 MED ORDER — MIDAZOLAM HCL 2 MG/2ML IJ SOLN
INTRAMUSCULAR | Status: AC
  Filled 2019-07-19: qty 2

## 2019-07-19 MED ORDER — PROPOFOL 10 MG/ML IV BOLUS
INTRAVENOUS | Status: AC
  Filled 2019-07-19: qty 20

## 2019-07-19 MED ORDER — CEFAZOLIN SODIUM-DEXTROSE 2-4 GM/100ML-% IV SOLN
INTRAVENOUS | Status: AC
  Filled 2019-07-19: qty 100

## 2019-07-19 MED ORDER — MIDAZOLAM HCL 5 MG/5ML IJ SOLN
INTRAMUSCULAR | Status: DC | PRN
  Administered 2019-07-19: 2 mg via INTRAVENOUS

## 2019-07-19 MED ORDER — CHLORHEXIDINE GLUCONATE 4 % EX LIQD: 60.0000 mL | Freq: Once | CUTANEOUS | Status: DC

## 2019-07-19 MED ORDER — MEPERIDINE HCL 25 MG/ML IJ SOLN: 6.2500 mg | INTRAMUSCULAR | Status: DC | PRN

## 2019-07-19 MED ORDER — PROPOFOL 500 MG/50ML IV EMUL
INTRAVENOUS | Status: DC | PRN
  Administered 2019-07-19: 100 ug/kg/min via INTRAVENOUS

## 2019-07-19 MED ORDER — KETOROLAC TROMETHAMINE 30 MG/ML IJ SOLN
INTRAMUSCULAR | Status: AC
  Filled 2019-07-19: qty 1

## 2019-07-19 MED ORDER — HYDROMORPHONE HCL 1 MG/ML IJ SOLN
0.2500 mg | INTRAMUSCULAR | Status: DC | PRN
  Administered 2019-07-19 (×2): 0.5 mg via INTRAVENOUS

## 2019-07-19 MED ORDER — BUPIVACAINE HCL (PF) 0.5 % IJ SOLN
INTRAMUSCULAR | Status: AC
  Filled 2019-07-19: qty 60

## 2019-07-19 MED ORDER — ONDANSETRON HCL 4 MG/2ML IJ SOLN
4.0000 mg | Freq: Once | INTRAMUSCULAR | Status: AC | PRN
  Administered 2019-07-19: 11:00:00 4 mg via INTRAVENOUS

## 2019-07-19 MED ORDER — KETOROLAC TROMETHAMINE 30 MG/ML IJ SOLN
30.0000 mg | Freq: Once | INTRAMUSCULAR | Status: AC
  Administered 2019-07-19: 10:00:00 30 mg via INTRAVENOUS

## 2019-07-19 MED ORDER — BUPIVACAINE HCL (PF) 0.25 % IJ SOLN
INTRAMUSCULAR | Status: AC
  Filled 2019-07-19: qty 60

## 2019-07-19 MED ORDER — ONDANSETRON HCL 4 MG/2ML IJ SOLN: 4.0000 mg | Freq: Once | INTRAMUSCULAR | Status: DC | PRN

## 2019-07-19 MED ORDER — FENTANYL CITRATE (PF) 250 MCG/5ML IJ SOLN
INTRAMUSCULAR | Status: DC | PRN
  Administered 2019-07-19: 25 ug via INTRAVENOUS
  Administered 2019-07-19: 50 ug via INTRAVENOUS

## 2019-07-19 MED ORDER — HYDROCODONE-ACETAMINOPHEN 5-325 MG PO TABS
ORAL_TABLET | ORAL | Status: AC
  Filled 2019-07-19: qty 1

## 2019-07-19 MED ORDER — FENTANYL CITRATE (PF) 100 MCG/2ML IJ SOLN
INTRAMUSCULAR | Status: AC
  Filled 2019-07-19: qty 2

## 2019-07-19 MED ORDER — BUPIVACAINE HCL (PF) 0.25 % IJ SOLN
INTRAMUSCULAR | Status: DC | PRN
  Administered 2019-07-19: 10 mL

## 2019-07-19 MED ORDER — ONDANSETRON HCL 4 MG/2ML IJ SOLN
INTRAMUSCULAR | Status: AC
  Filled 2019-07-19: qty 2

## 2019-07-19 MED ORDER — CEFAZOLIN SODIUM-DEXTROSE 2-4 GM/100ML-% IV SOLN
2.0000 g | INTRAVENOUS | Status: AC
  Administered 2019-07-19: 09:00:00 2 g via INTRAVENOUS

## 2019-07-19 MED ORDER — HYDROMORPHONE HCL 1 MG/ML IJ SOLN
0.2500 mg | INTRAMUSCULAR | Status: DC | PRN
  Administered 2019-07-19: 0.5 mg via INTRAVENOUS

## 2019-07-19 MED ORDER — HYDROCODONE-ACETAMINOPHEN 5-325 MG PO TABS
1.0000 | ORAL_TABLET | Freq: Once | ORAL | Status: AC
  Administered 2019-07-19: 1 via ORAL

## 2019-07-19 MED ORDER — HYDROCODONE-ACETAMINOPHEN 5-325 MG PO TABS: 1.0000 | ORAL_TABLET | Freq: Two times a day (BID) | ORAL | 0 refills | Status: DC | PRN

## 2019-07-19 SURGICAL SUPPLY — 48 items
BAND RUBBER #18 3X1/16 STRL (MISCELLANEOUS) ×6 IMPLANT
BLADE MINI RND TIP GREEN BEAV (BLADE) ×3 IMPLANT
BLADE SURG 15 STRL LF DISP TIS (BLADE) ×1 IMPLANT
BLADE SURG 15 STRL SS (BLADE) ×2
BNDG ELASTIC 3X5.8 VLCR STR LF (GAUZE/BANDAGES/DRESSINGS) ×3 IMPLANT
BNDG ESMARK 4X9 LF (GAUZE/BANDAGES/DRESSINGS) ×3 IMPLANT
BNDG PLASTER X FAST 3X3 WHT LF (CAST SUPPLIES) IMPLANT
BRUSH SCRUB EZ PLAIN DRY (MISCELLANEOUS) ×3 IMPLANT
CANISTER SUCT 1200ML W/VALVE (MISCELLANEOUS) ×3 IMPLANT
CORD BIPOLAR FORCEPS 12FT (ELECTRODE) ×3 IMPLANT
COVER BACK TABLE 60X90IN (DRAPES) ×3 IMPLANT
COVER MAYO STAND STRL (DRAPES) ×3 IMPLANT
COVER WAND RF STERILE (DRAPES) IMPLANT
CUFF TOURN SGL QUICK 18X4 (TOURNIQUET CUFF) IMPLANT
DECANTER SPIKE VIAL GLASS SM (MISCELLANEOUS) IMPLANT
DRAPE EXTREMITY T 121X128X90 (DISPOSABLE) ×3 IMPLANT
DRAPE IMP U-DRAPE 54X76 (DRAPES) ×3 IMPLANT
DRAPE SURG 17X23 STRL (DRAPES) ×3 IMPLANT
GAUZE 4X4 16PLY RFD (DISPOSABLE) IMPLANT
GAUZE SPONGE 4X4 12PLY STRL (GAUZE/BANDAGES/DRESSINGS) ×3 IMPLANT
GAUZE XEROFORM 1X8 LF (GAUZE/BANDAGES/DRESSINGS) ×3 IMPLANT
GLOVE BIO SURGEON STRL SZ7 (GLOVE) ×3 IMPLANT
GLOVE BIOGEL PI IND STRL 7.0 (GLOVE) ×2 IMPLANT
GLOVE BIOGEL PI IND STRL 8 (GLOVE) ×1 IMPLANT
GLOVE BIOGEL PI INDICATOR 7.0 (GLOVE) ×4
GLOVE BIOGEL PI INDICATOR 8 (GLOVE) ×2
GLOVE ECLIPSE 7.0 STRL STRAW (GLOVE) ×3 IMPLANT
GLOVE SKINSENSE NS SZ7.5 (GLOVE) ×2
GLOVE SKINSENSE STRL SZ7.5 (GLOVE) ×1 IMPLANT
GLOVE SURG SYN 7.5  E (GLOVE) ×2
GLOVE SURG SYN 7.5 E (GLOVE) ×1 IMPLANT
GOWN STRL REIN XL XLG (GOWN DISPOSABLE) ×3 IMPLANT
GOWN STRL REUS W/ TWL XL LVL3 (GOWN DISPOSABLE) ×2 IMPLANT
GOWN STRL REUS W/TWL XL LVL3 (GOWN DISPOSABLE) ×4
NEEDLE HYPO 25X1 1.5 SAFETY (NEEDLE) IMPLANT
NS IRRIG 1000ML POUR BTL (IV SOLUTION) ×3 IMPLANT
PACK BASIN DAY SURGERY FS (CUSTOM PROCEDURE TRAY) ×3 IMPLANT
PAD CAST 3X4 CTTN HI CHSV (CAST SUPPLIES) ×1 IMPLANT
PADDING CAST COTTON 3X4 STRL (CAST SUPPLIES) ×2
STOCKINETTE 4X48 STRL (DRAPES) ×3 IMPLANT
SUT ETHILON 3 0 PS 1 (SUTURE) ×3 IMPLANT
SYR BULB 3OZ (MISCELLANEOUS) ×3 IMPLANT
SYR CONTROL 10ML LL (SYRINGE) IMPLANT
TOWEL GREEN STERILE FF (TOWEL DISPOSABLE) ×3 IMPLANT
TRAY DSU PREP LF (CUSTOM PROCEDURE TRAY) ×3 IMPLANT
TUBE CONNECTING 20'X1/4 (TUBING)
TUBE CONNECTING 20X1/4 (TUBING) IMPLANT
UNDERPAD 30X36 HEAVY ABSORB (UNDERPADS AND DIAPERS) ×3 IMPLANT

## 2019-07-19 NOTE — Anesthesia Procedure Notes (Signed)
Procedure Name: MAC Date/Time: 07/19/2019 8:39 AM Performed by: Myna Bright, CRNA Pre-anesthesia Checklist: Patient identified, Emergency Drugs available, Suction available, Patient being monitored and Timeout performed Patient Re-evaluated:Patient Re-evaluated prior to induction Oxygen Delivery Method: Simple face mask

## 2019-07-19 NOTE — Transfer of Care (Signed)
Immediate Anesthesia Transfer of Care Note  Patient: Alexandra Norris  Procedure(s) Performed: RIGHT CARPAL TUNNEL RELEASE (Right Wrist)  Patient Location: PACU  Anesthesia Type:MAC and Bier block  Level of Consciousness: awake, alert , oriented and patient cooperative  Airway & Oxygen Therapy: Patient Spontanous Breathing and Patient connected to face mask oxygen  Post-op Assessment: Report given to RN, Post -op Vital signs reviewed and stable and Patient moving all extremities  Post vital signs: Reviewed and stable  Last Vitals:  Vitals Value Taken Time  BP 117/58 07/19/19 0904  Temp    Pulse 72 07/19/19 0908  Resp 13 07/19/19 0908  SpO2 100 % 07/19/19 0908  Vitals shown include unvalidated device data.  Last Pain:  Vitals:   07/19/19 0739  TempSrc: Temporal  PainSc: 5          Complications: No apparent anesthesia complications

## 2019-07-19 NOTE — Anesthesia Procedure Notes (Signed)
Anesthesia Regional Block: Bier block (IV Regional)   Pre-Anesthetic Checklist: ,, timeout performed, Correct Patient, Correct Site, Correct Laterality, Correct Procedure, Correct Position, site marked, Risks and benefits discussed,  Surgical consent,  Pre-op evaluation,  At surgeon's request and post-op pain management  Laterality: Right  Prep: chloraprep       Needles:       Needle Gauge: 22     Additional Needles:   Narrative:

## 2019-07-19 NOTE — Anesthesia Preprocedure Evaluation (Signed)
Anesthesia Evaluation  Patient identified by MRN, date of birth, ID band Patient awake    Reviewed: Allergy & Precautions, NPO status , Patient's Chart, lab work & pertinent test results  Airway Mallampati: I  TM Distance: >3 FB Neck ROM: Full    Dental   Pulmonary    Pulmonary exam normal        Cardiovascular Normal cardiovascular exam     Neuro/Psych Depression    GI/Hepatic GERD  Medicated and Controlled,  Endo/Other    Renal/GU      Musculoskeletal   Abdominal   Peds  Hematology   Anesthesia Other Findings   Reproductive/Obstetrics                             Anesthesia Physical Anesthesia Plan  ASA: II  Anesthesia Plan: Bier Block and Bier Block-LIDOCAINE ONLY   Post-op Pain Management:    Induction: Intravenous  PONV Risk Score and Plan: 2 and Ondansetron and Treatment may vary due to age or medical condition  Airway Management Planned: Nasal Cannula  Additional Equipment:   Intra-op Plan:   Post-operative Plan:   Informed Consent: I have reviewed the patients History and Physical, chart, labs and discussed the procedure including the risks, benefits and alternatives for the proposed anesthesia with the patient or authorized representative who has indicated his/her understanding and acceptance.       Plan Discussed with: CRNA and Surgeon  Anesthesia Plan Comments:         Anesthesia Quick Evaluation

## 2019-07-19 NOTE — Op Note (Signed)
   Carpal tunnel op note  DATE OF SURGERY:07/19/2019  PREOPERATIVE DIAGNOSIS:  Right carpal tunnel syndrome  POSTOPERATIVE DIAGNOSIS: same  PROCEDURE:  Right carpal tunnel release. CPT [phone]  SURGEON: Surgeon(s): Leandrew Koyanagi, MD  ASSIST: Madalyn Rob, PA-C; necessary for the timely completion of procedure and due to complexity of procedure.  ANESTHESIA:  Monitor Anesthesia Care, Bier block  TOURNIQUET TIME: less than 20 minutes  BLOOD LOSS: Minimal.  COMPLICATIONS: None.  PATHOLOGY: None.  INDICATIONS: The patient is a 44 y.o. -year-old female who presented with carpal tunnel syndrome failing nonsurgical management, indicated for surgical release.  DESCRIPTION OF PROCEDURE: The patient was identified in the preoperative holding area.  The operative site was marked by the surgeon and confirmed by the patient.  He was brought back to the operating room.  Anesthesia was induced by the anesthesia team.  A well padded nonsterile tourniquet was placed. The operative extremity was prepped and draped in standard sterile fashion.  A timeout was performed.  Preoperative antibiotics were given.   A palmar incision was made about 5 mm ulnar to the thenar crease.  The palmar aponeurosis was exposed and divided in line with the skin incision. The palmaris brevis was visualized and divided.  The distal edge of the transcarpal ligament was identified. A hemostat was inserted into the carpal tunnel to protect the median nerve and the flexor tendons. Then, the transverse carpal ligament was released under direct visualization. Proximally, a subcutaneous tunnel was made allowing a Sewell retractor to be placed. Then, the distal portion of the antebrachial fascia was released. Distally, all fibrous bands were released. The median nerve was visualized, and the fat pad was exposed. Following release, local infiltration with 0.25% of Sensorcaine was given. The tourniquet was deflated. Hemostasis  achieved.  Wound was irrigated and closed with 4-0 nylon sutures. Sterile dressing applied. The patient was transferred to the recovery room in stable condition after all counts were correct.  POSTOPERATIVE PLAN: To start nerve gliding exercises as tolerated and no heavy lifting for four weeks.  Eduard Roux, M.D. [facility] 8:59 AM

## 2019-07-19 NOTE — H&P (Signed)
PREOPERATIVE H&P  Chief Complaint: right carpal tunnel syndrome  HPI: Alexandra Norris is a 44 y.o. female who presents for surgical treatment of right carpal tunnel syndrome.  She denies any changes in medical history.  Past Medical History:  Diagnosis Date  . Anemia   . Arthritis   . Asthma   . Carpal tunnel syndrome   . Endometriosis   . History of blood transfusion 1999   w/vaginal delivery  . History of bronchitis    "I've stayed in hospital 2 X w/bronchitis"  . Migraine    Past Surgical History:  Procedure Laterality Date  . CESAREAN SECTION  1997; 2008  . CHOLECYSTECTOMY  2011  . tonsilletomy    . TUBAL LIGATION  04/2007   Social History   Socioeconomic History  . Marital status: Married    Spouse name: Not on file  . Number of children: Not on file  . Years of education: Not on file  . Highest education level: Not on file  Occupational History  . Not on file  Tobacco Use  . Smoking status: Never Smoker  . Smokeless tobacco: Never Used  Substance and Sexual Activity  . Alcohol use: No  . Drug use: No  . Sexual activity: Yes    Birth control/protection: None  Other Topics Concern  . Not on file  Social History Narrative  . Not on file   Social Determinants of Health   Financial Resource Strain:   . Difficulty of Paying Living Expenses: Not on file  Food Insecurity:   . Worried About Charity fundraiser in the Last Year: Not on file  . Ran Out of Food in the Last Year: Not on file  Transportation Needs:   . Lack of Transportation (Medical): Not on file  . Lack of Transportation (Non-Medical): Not on file  Physical Activity:   . Days of Exercise per Week: Not on file  . Minutes of Exercise per Session: Not on file  Stress:   . Feeling of Stress : Not on file  Social Connections:   . Frequency of Communication with Friends and Family: Not on file  . Frequency of Social Gatherings with Friends and Family: Not on file  . Attends  Religious Services: Not on file  . Active Member of Clubs or Organizations: Not on file  . Attends Archivist Meetings: Not on file  . Marital Status: Not on file   Family History  Problem Relation Age of Onset  . Diabetes Mother   . Osteoarthritis Mother   . Hypertension Mother    No Known Allergies Prior to Admission medications   Medication Sig Start Date End Date Taking? Authorizing Provider  megestrol (MEGACE) 20 MG tablet Take 20 mg by mouth daily.   Yes Joline Salt, RN     Positive ROS: All other systems have been reviewed and were otherwise negative with the exception of those mentioned in the HPI and as above.  Physical Exam: General: Alert, no acute distress Cardiovascular: No pedal edema Respiratory: No cyanosis, no use of accessory musculature GI: abdomen soft Skin: No lesions in the area of chief complaint Neurologic: Sensation intact distally Psychiatric: Patient is competent for consent with normal mood and affect Lymphatic: no lymphedema  MUSCULOSKELETAL: exam stable  Assessment: right carpal tunnel syndrome  Plan: Plan for Procedure(s): RIGHT CARPAL TUNNEL RELEASE  The risks benefits and alternatives were discussed with the patient including but not limited to the risks of  nonoperative treatment, versus surgical intervention including infection, bleeding, nerve injury,  blood clots, cardiopulmonary complications, morbidity, mortality, among others, and they were willing to proceed.   Eduard Roux, MD   07/19/2019 8:23 AM

## 2019-07-19 NOTE — Anesthesia Postprocedure Evaluation (Signed)
Anesthesia Post Note  Patient: Alexandra Norris  Procedure(s) Performed: RIGHT CARPAL TUNNEL RELEASE (Right Wrist)     Patient location during evaluation: PACU Anesthesia Type: MAC Level of consciousness: awake and alert Pain management: pain level controlled Vital Signs Assessment: post-procedure vital signs reviewed and stable Respiratory status: spontaneous breathing, nonlabored ventilation, respiratory function stable and patient connected to nasal cannula oxygen Cardiovascular status: stable and blood pressure returned to baseline Postop Assessment: no apparent nausea or vomiting Anesthetic complications: no    Last Vitals:  Vitals:   07/19/19 1000 07/19/19 1051  BP: 112/76 (!) 113/58  Pulse: 66 78  Resp: 11 12  Temp:  36.7 C  SpO2: 98% 98%    Last Pain:  Vitals:   07/19/19 1109  TempSrc:   PainSc: 4                  Rain Wilhide DAVID

## 2019-07-19 NOTE — Discharge Instructions (Signed)
Postoperative instructions:  Weightbearing instructions: no lifting more than 10 lbs for 1 month  Dressing instructions: Keep your dressing and/or splint clean and dry at all times.  It will be removed at your first post-operative appointment.  Your stitches and/or staples will be removed at this visit.  Incision instructions:  Do not soak your incision for 3 weeks after surgery.  If the incision gets wet, pat dry and do not scrub the incision.  Pain control:  You have been given a prescription to be taken as directed for post-operative pain control.  In addition, elevate the operative extremity above the heart at all times to prevent swelling and throbbing pain.  Take over-the-counter Colace, 100mg  by mouth twice a day while taking narcotic pain medications to help prevent constipation.  Follow up appointments: 1) 7 days for wound check. 2) Dr. Erlinda Hong as scheduled.   -------------------------------------------------------------------------------------------------------------  After Surgery Pain Control:  After your surgery, post-surgical discomfort or pain is likely. This discomfort can last several days to a few weeks. At certain times of the day your discomfort may be more intense.  Did you receive a nerve block?  A nerve block can provide pain relief for one hour to two days after your surgery. As long as the nerve block is working, you will experience little or no sensation in the area the surgeon operated on.  As the nerve block wears off, you will begin to experience pain or discomfort. It is very important that you begin taking your prescribed pain medication before the nerve block fully wears off. Treating your pain at the first sign of the block wearing off will ensure your pain is better controlled and more tolerable when full-sensation returns. Do not wait until the pain is intolerable, as the medicine will be less effective. It is better to treat pain in advance than to try and  catch up.  General Anesthesia:  If you did not receive a nerve block during your surgery, you will need to start taking your pain medication shortly after your surgery and should continue to do so as prescribed by your surgeon.  Pain Medication:  Most commonly we prescribe Vicodin and Percocet for post-operative pain. Both of these medications contain a combination of acetaminophen (Tylenol) and a narcotic to help control pain.   It takes between 30 and 45 minutes before pain medication starts to work. It is important to take your medication before your pain level gets too intense.   Nausea is a common side effect of many pain medications. You will want to eat something before taking your pain medicine to help prevent nausea.   If you are taking a prescription pain medication that contains acetaminophen, we recommend that you do not take additional over the counter acetaminophen (Tylenol).  Other pain relieving options:   Using a cold pack to ice the affected area a few times a day (15 to 20 minutes at a time) can help to relieve pain, reduce swelling and bruising.   Elevation of the affected area can also help to reduce pain and swelling.  Post Anesthesia Home Care Instructions  Activity: Get plenty of rest for the remainder of the day. A responsible individual must stay with you for 24 hours following the procedure.  For the next 24 hours, DO NOT: -Drive a car -Paediatric nurse -Drink alcoholic beverages -Take any medication unless instructed by your physician -Make any legal decisions or sign important papers.  Meals: Start with liquid foods such as  gelatin or soup. Progress to regular foods as tolerated. Avoid greasy, spicy, heavy foods. If nausea and/or vomiting occur, drink only clear liquids until the nausea and/or vomiting subsides. Call your physician if vomiting continues.  Special Instructions/Symptoms: Your throat may feel dry or sore from the anesthesia or the  breathing tube placed in your throat during surgery. If this causes discomfort, gargle with warm salt water. The discomfort should disappear within 24 hours.  If you had a scopolamine patch placed behind your ear for the management of post- operative nausea and/or vomiting:  1. The medication in the patch is effective for 72 hours, after which it should be removed.  Wrap patch in a tissue and discard in the trash. Wash hands thoroughly with soap and water. 2. You may remove the patch earlier than 72 hours if you experience unpleasant side effects which may include dry mouth, dizziness or visual disturbances. 3. Avoid touching the patch. Wash your hands with soap and water after contact with the patch.

## 2019-07-20 ENCOUNTER — Encounter: Payer: Self-pay | Admitting: *Deleted

## 2019-07-21 NOTE — Addendum Note (Signed)
Addendum  created 07/21/19 0826 by Tawni Millers, CRNA   Charge Capture section accepted, Visit diagnoses modified

## 2019-07-25 ENCOUNTER — Telehealth: Payer: Self-pay | Admitting: Orthopaedic Surgery

## 2019-07-25 ENCOUNTER — Other Ambulatory Visit: Payer: Self-pay | Admitting: Physician Assistant

## 2019-07-25 MED ORDER — OXYCODONE-ACETAMINOPHEN 5-325 MG PO TABS: 1.0000 | ORAL_TABLET | ORAL | 0 refills | Status: DC | PRN

## 2019-07-25 MED ORDER — OXYCODONE-ACETAMINOPHEN 5-325 MG PO TABS: 1.0000 | ORAL_TABLET | Freq: Two times a day (BID) | ORAL | 0 refills | Status: DC | PRN

## 2019-07-25 NOTE — Telephone Encounter (Signed)
Pt called in said she had surgery with dr.xu 07/19/19 and she is experiencing extreme pain feels like her hand is going to burst. Pt states the pain medication given to her wasn't helping at all and she is now out of the medication and is wondering if she can have something different sent in for the pain? Pharmacy is [facility] on [address].  [phone]

## 2019-07-25 NOTE — Telephone Encounter (Signed)
Loosen ace wrap if needed.  I have called in ONE rx for percocet.  Have her come in tomorrow if she does not already have appt

## 2019-07-26 ENCOUNTER — Encounter: Payer: Self-pay | Admitting: Orthopaedic Surgery

## 2019-07-26 ENCOUNTER — Ambulatory Visit (INDEPENDENT_AMBULATORY_CARE_PROVIDER_SITE_OTHER): Payer: Self-pay | Admitting: Physician Assistant

## 2019-07-26 ENCOUNTER — Other Ambulatory Visit: Payer: Self-pay

## 2019-07-26 DIAGNOSIS — Z9889 Other specified postprocedural states: Secondary | ICD-10-CM

## 2019-07-26 NOTE — Telephone Encounter (Signed)
Called to advise patient.  

## 2019-07-26 NOTE — Progress Notes (Signed)
   Post-Op Visit Note   Patient: Alexandra Norris           Date of Birth: 12-Aug-1975           MRN: [id number] Visit Date: 07/26/2019 PCP: Charlott Rakes, MD   Assessment & Plan:  Chief Complaint:  Chief Complaint  Patient presents with  . Right Hand - Pain   Visit Diagnoses:  1. S/P carpal tunnel release     Plan: Patient is a pleasant 44 year old female who presents status post right carpal tunnel release 07/19/2019.  She is here with a Spanish-speaking interpreter.  She has been doing well.  She does have moderate amount of pain up until recently.  No fevers or chills.  She denies any numbness, tingling or burning to her right upper extremity.  Examination of her right wrist reveals a well-healing surgical incision with nylon sutures in place.  No evidence of infection or cellulitis.  Fingers are warm well perfused.  Today, her wound was covered with a Band-Aid and removable splint applied.  She will avoid any heavy lifting or submerging her hand in water for a total of 4 weeks after surgery.  Follow-up with [place] in 1 week's time for repeat evaluation and probable suture removal.  Call with concerns or questions in the meantime.  Follow-Up Instructions: Return in about 1 week (around 08/02/2019).   Orders:  No orders of the defined types were placed in this encounter.  No orders of the defined types were placed in this encounter.   Imaging: No new imaging  PMFS History: Patient Active Problem List   Diagnosis Date Noted  . Right carpal tunnel syndrome 07/11/2019  . Left carpal tunnel syndrome 07/11/2019  . Arthritis   . Anemia   . Tracheobronchitis 08/25/2017  . Seasonal allergies 10/08/2016  . Dysmenorrhea 10/08/2016  . Pre-diabetes 10/21/2015  . Bilateral chronic knee pain 09/25/2015  . GASTROESOPHAGEAL REFLUX DISEASE 08/14/2009  . DEPRESSION, SITUATIONAL, PROLONGED 02/05/2009  . UNSPECIFIED VISUAL LOSS 11/27/2008  . OBESITY 05/30/2007  . IRREGULAR  MENSTRUAL CYCLE 05/30/2007  . Moderate persistent asthma with acute exacerbation 12/06/2006   Past Medical History:  Diagnosis Date  . Anemia   . Arthritis   . Asthma   . Carpal tunnel syndrome   . Endometriosis   . History of blood transfusion 1999   w/vaginal delivery  . History of bronchitis    "I've stayed in hospital 2 X w/bronchitis"  . Migraine     Family History  Problem Relation Age of Onset  . Diabetes Mother   . Osteoarthritis Mother   . Hypertension Mother     Past Surgical History:  Procedure Laterality Date  . CARPAL TUNNEL RELEASE Right 07/19/2019   Procedure: RIGHT CARPAL TUNNEL RELEASE;  Surgeon: Leandrew Koyanagi, MD;  Location: [place];  Service: Orthopedics;  Laterality: Right;  . CESAREAN SECTION  1997; 2008  . CHOLECYSTECTOMY  2011  . tonsilletomy    . TUBAL LIGATION  04/2007   Social History   Occupational History  . Not on file  Tobacco Use  . Smoking status: Never Smoker  . Smokeless tobacco: Never Used  Substance and Sexual Activity  . Alcohol use: No  . Drug use: No  . Sexual activity: Yes    Birth control/protection: None

## 2019-08-01 ENCOUNTER — Ambulatory Visit: Payer: Self-pay | Admitting: Women's Health

## 2019-08-01 ENCOUNTER — Ambulatory Visit
Admission: RE | Admit: 2019-08-01 | Discharge: 2019-08-01 | Disposition: A | Discharge status: Home / Self Care | Payer: No Typology Code available for payment source | Source: Ambulatory Visit | Attending: Obstetrics and Gynecology | Admitting: Obstetrics and Gynecology

## 2019-08-01 ENCOUNTER — Other Ambulatory Visit: Payer: Self-pay

## 2019-08-01 VITALS — BP 109/76 | Temp 97.5°F | Wt 236.0 lb

## 2019-08-01 DIAGNOSIS — Z1231 Encounter for screening mammogram for malignant neoplasm of breast: Secondary | ICD-10-CM

## 2019-08-01 DIAGNOSIS — Z1239 Encounter for other screening for malignant neoplasm of breast: Secondary | ICD-10-CM

## 2019-08-01 DIAGNOSIS — Z124 Encounter for screening for malignant neoplasm of cervix: Secondary | ICD-10-CM

## 2019-08-01 IMAGING — MG DIGITAL SCREENING BILAT W/ TOMO W/ CAD
6 of 10 series · 6 of 30 positions shown · non-contrast
Comparison: Previous exam(s).

CLINICAL DATA: Screening.

EXAM:
DIGITAL SCREENING BILATERAL MAMMOGRAM WITH TOMO AND CAD

[R CC synth-2D]
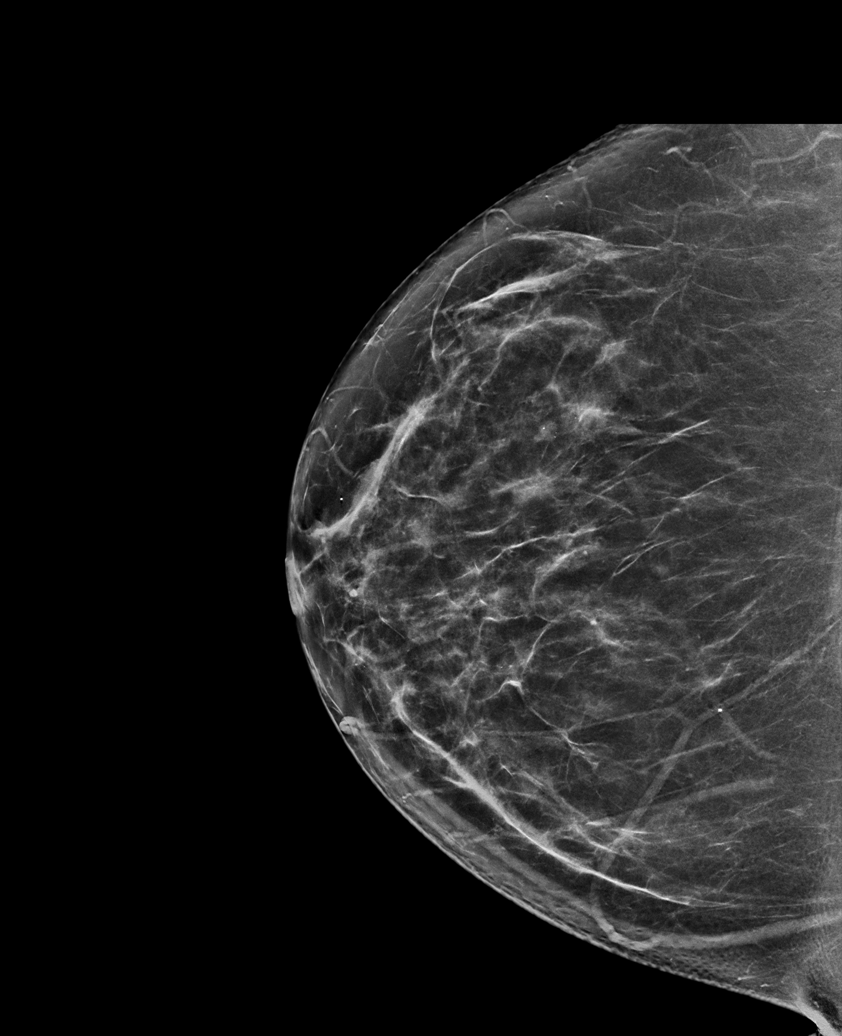

[L MLO synth-2D (1 of 2)]
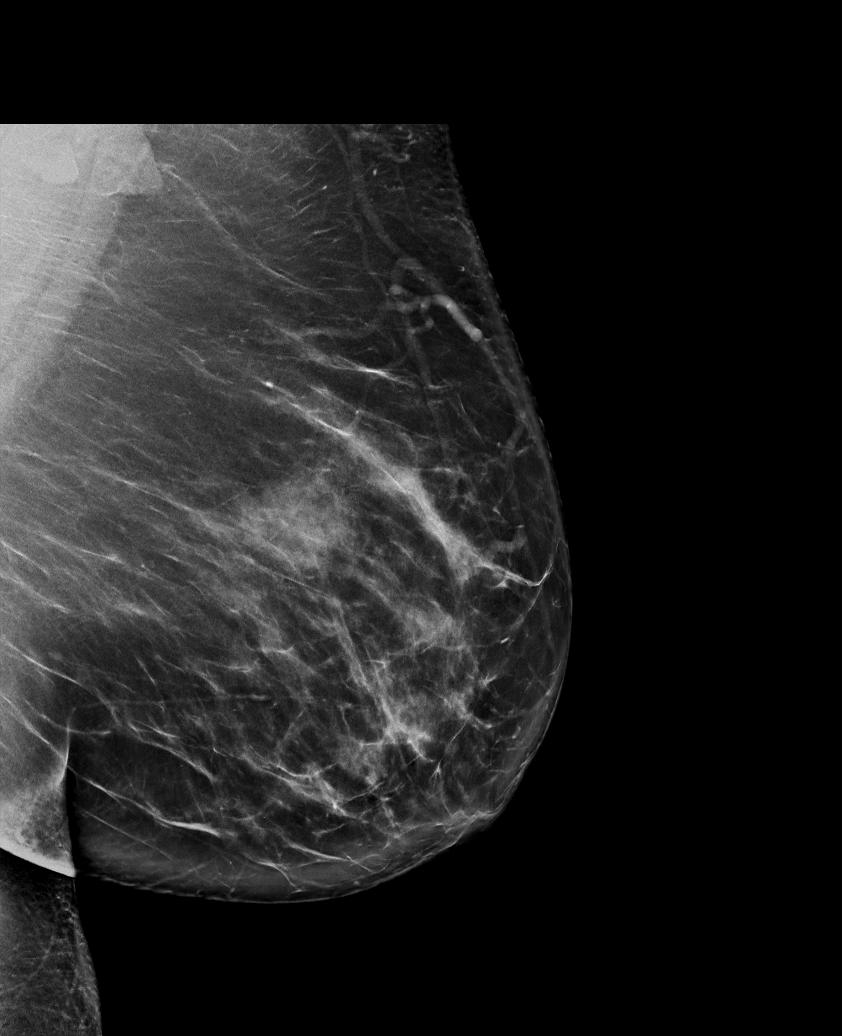

[L CC synth-2D]
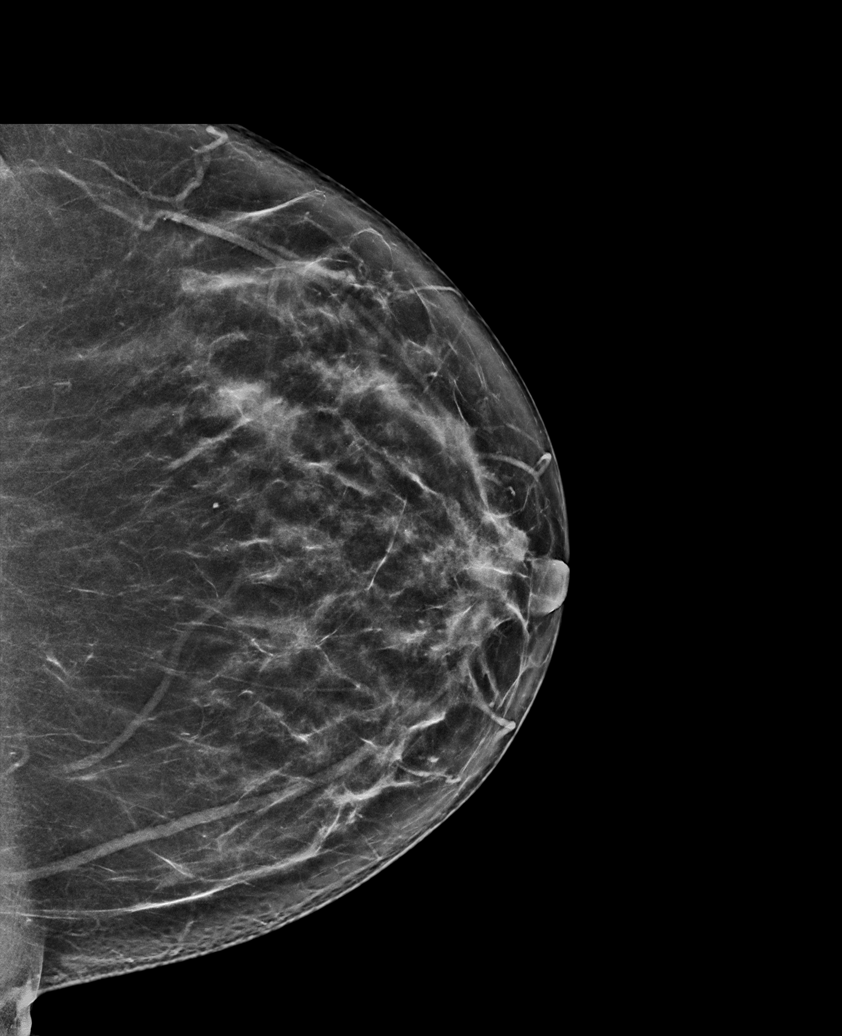

[L MLO synth-2D (2 of 2)]
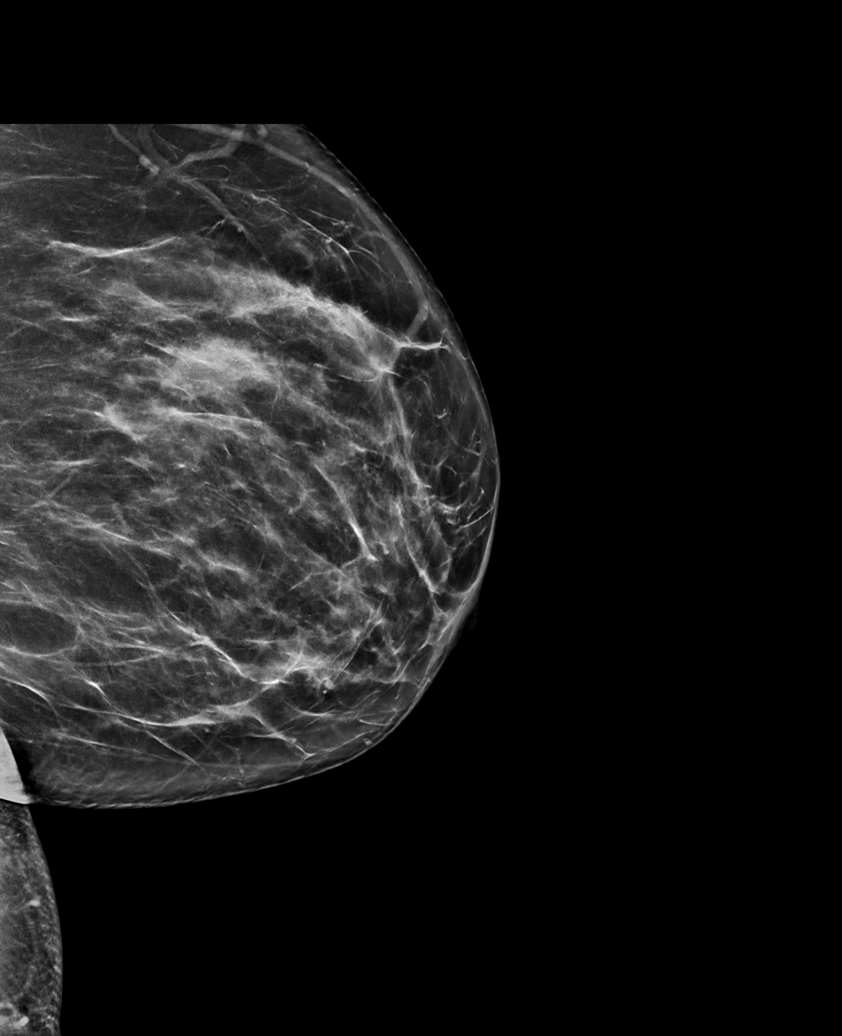

[R MLO synth-2D]
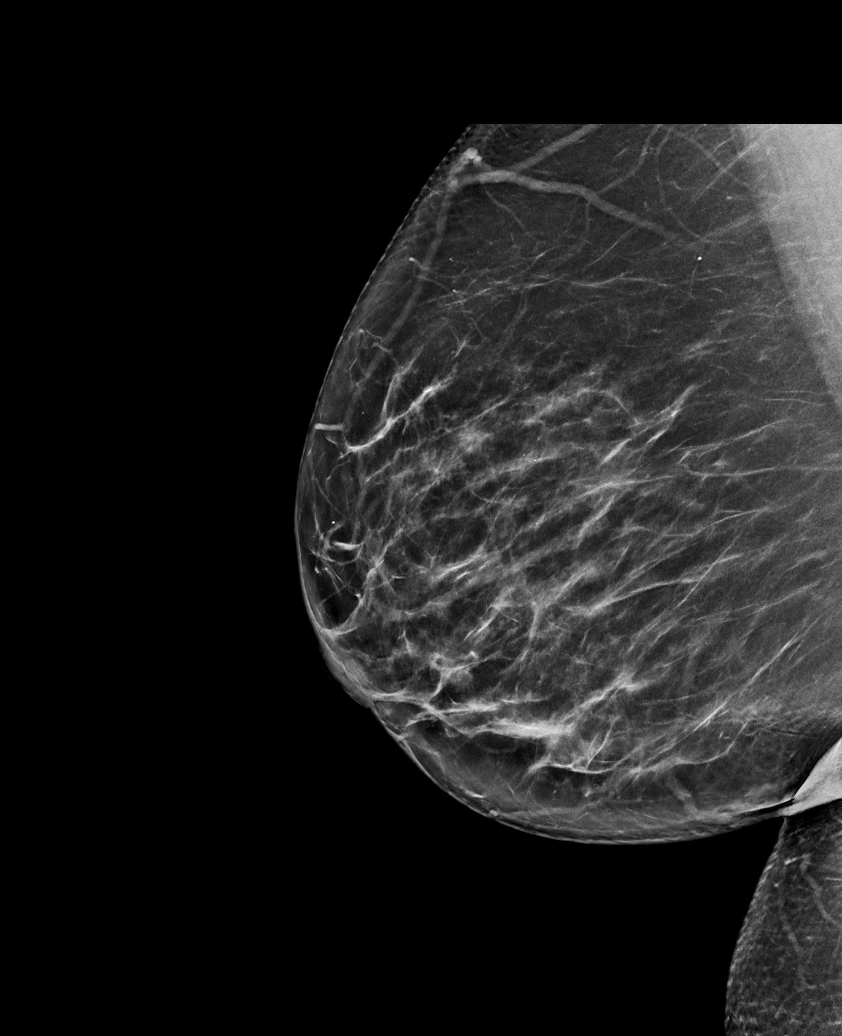

[R MLO tomo · tomo slice 43/85.0]
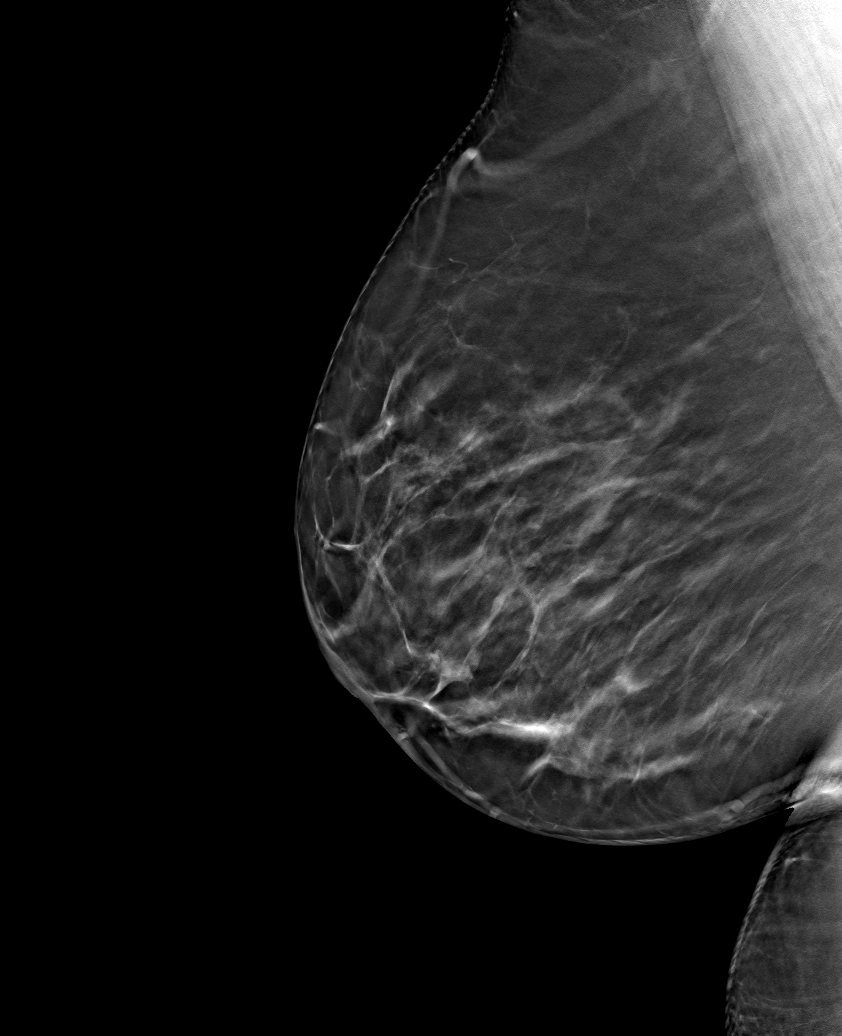

[6 of 30 positions shown; findings below may reference images not displayed]

ACR Breast Density Category c: The breast tissue is heterogeneously
dense, which may obscure small masses.
FINDINGS: There are no findings suspicious for malignancy. Images were
processed with CAD.
IMPRESSION: No mammographic evidence of malignancy. A result letter of this
screening mammogram will be mailed directly to the patient.

RECOMMENDATION:
Screening mammogram in one year. (Code:[5V])

BI-RADS CATEGORY  1: Negative.

## 2019-08-01 NOTE — Progress Notes (Signed)
Alexandra Norris is a 44 y.o. female who presents to [facility] clinic today with complaint of itchy rash on chest/breasts x68months. Ptreports pink bumps that comes and goes on the middle and outer part of breasts. Pt reports this is not preset today and reports no concerns today.    Pap Smear: Pap not smear completed today. Last Pap smear was 05/15/2019 at [facility] cervical cancer screening clinic and was normal. Per patient has history of an abnormal Pap smear. Last Pap smear result is available in Epic.   Physical exam: Breasts Breasts symmetrical. No skin abnormalities bilateral breasts. No nipple retraction bilateral breasts. No nipple discharge bilateral breasts. No lymphadenopathy. No lumps palpated bilateral breasts.       Pelvic/Bimanual Pap is not indicated today. Pt is getting hysterectomy 10/2019, was delayed prior d/t COVID.   Smoking History: Patient has never smoked notreferred to quit line.    Patient Navigation: Patient education provided. Access to services provided for patient through [facility] program. Spanish interpreter provided. No transportation provided   Colorectal Cancer Screening: Per patient has never had colonoscopy completed No complaints today.    Breast and Cervical Cancer Risk Assessment: Patient does not have family history of breast cancer, known genetic mutations, or radiation treatment to the chest before age 73. Patient does not have history of cervical dysplasia, immunocompromised, or DES exposure in-utero.  Risk Assessment    Risk Scores      08/01/2019   Last edited by: Demetrius Revel, LPN   5-year risk: 0.3 %   Lifetime risk: 5.1 %          A: BCCCP exam without pap smear Complaint of none.  P: Referred patient to the [place] for a screening mammogram. Appointment scheduled 08/01/2019.  Alexandra Fling, NP 08/01/2019 11:43 AM

## 2019-08-01 NOTE — Patient Instructions (Signed)
Prevencin del cncer de cuello uterino Preventing Cervical Cancer El cncer de cuello uterino es un tipo de cncer que se desarrolla en el cuello uterino. El cuello uterino est en la parte inferior del tero. Conecta el tero con la vagina. El tero es el lugar donde el beb se desarrolla durante el embarazo. El cncer ocurre cuando algunas clulas comienzan a tener anomalas y crecen de forma descontrolada. Si el cncer de cuello uterino no se detecta temprano, puede diseminarse y volverse peligroso. El cncer de cuello uterino no siempre puede prevenirse, pero puede tomar medidas para reducir el riesgo de Building control surveyor. Cmo puede afectarme esta enfermedad? El cncer de cuello uterino se desarrolla lentamente y podra no causar ningn sntoma al principio. Con el paso del [place], el cncer puede desarrollarse a mayor profundidad en el tejido del cuello uterino y expandirse a otras zonas. Esto puede tardar [place], y puede suceder sin que usted lo sepa. Si se halla a tiempo, el cncer de cuello uterino puede tratarse de forma eficaz. Si el cncer se ha desarrollado con profundidad en el cuello uterino o si se ha diseminado, ser ms difcil de tratar. La mayora de los casos de cncer de cuello uterino son causados por una ITS (infeccin de transmisin sexual) a raz de un virus llamado virus del Engineer, technical sales (VPH). Una forma de disminuir el riesgo de Horticulturist, commercial de cuello uterino es tomar medidas para evitar la infeccin por el VPH. Tambin es importante que se realice pruebas de Papanicolaou con regularidad, ya que estas pueden ayudar a identificar cambios en las clulas que podran Dentist. Las posibilidades de Actor esta enfermedad tambin pueden reducirse realizando ciertos cambios en el estilo de vida. [place]? Es ms probable que sufra esta afeccin si:  Tiene ciertos elementos en su historia sexual, por ejemplo: ? Tener una infeccin viral de  transmisin sexual. Entre ellas se encuentran la clamidia y el herpes. ? Tener ms de una pareja sexual o tener sexo con alguien que tenga ms de una pareja sexual. ? No usar condones [place]. ? [place] tenido actividad sexual antes de los 18aos.  Su madre tom Sports coach (DES) mientras estaba embarazada de usted, lo que hizo que estuviera expuesto a este medicamento antes del nacimiento.  Su madre o hermana ha tenido cncer de cuello uterino.  Tiene entre [phone].  Tiene o ha tenido [employer], por ejemplo: ? Cncer de vagina o vulva previo. ? Sistema de defensa del organismo (sistema inmunitario) debilitado. ? Historia de displasia en el cuello uterino.  [place] anticonceptivos orales, tambin llamados pldoras anticonceptivas.  Fuma o est expuesta al humo que exhalan los fumadores. Qu medidas puedo tomar para prevenir el cncer de cuello uterino? Prevencin contra la infeccin por VPH   Pregntele a su mdico acerca de cmo recibir la Charity fundraiser VPH. Si tiene 26aos o menos, es posible que deba colocarse esta vacuna, que se administra en tres dosis durante 44meses. Esta vacuna la protege contra los tipos de VPH que podran Dentist.  Limite la cantidad de personas con las que Health Net. [place], evite tener sexo con personas que han tenido demasiadas parejas sexuales.  Use un condn de ltex cada vez que tenga relaciones sexuales. Realizacin de las pruebas de Papanicolaou Desde los 21aos, realcese pruebas de Papanicolaou con regularidad. Hable con el mdico sobre la frecuencia con la que debe hacerse estas pruebas. Realizarse pruebas de Papanicolaou  con regularidad ayudar a identificar cambios en las clulas que podran Dentist. Luego, se podrn tomar medidas para evitar el desarrollo del cncer.  La mayora de las mujeres que tienen [place] 21y29aos deben  realizarse una prueba de Papanicolaou cada 3aos.  La mayora de las mujeres que tienen [place] 30y65aos deben realizarse una prueba de Papanicolaou y Ardelia Mems prueba del VPH cada 5aos.  Las [employer] que presentan un mayor riesgo de Horticulturist, commercial de cuello uterino, como aquellas con un debilitamiento del sistema inmunitario o aquellas que estuvieron expuestas al [employer] dietilestilbestrol (DES) antes del nacimiento, posiblemente deban realizarse las pruebas con mayor frecuencia. Realizacin de otros cambios en el estilo de vida   No consuma ningn producto que contenga nicotina o tabaco, como cigarrillos, cigarrillos electrnicos y tabaco de Higher education careers adviser. Si necesita ayuda para dejar de consumir estos productos, consulte al [employer].  Siga una dieta saludable que incluya, como mnimo, 5 porciones de frutas y verduras todos [place].  Baje de peso si es necesario. Dnde buscar apoyo Hable con el mdico, el enfermero de la escuela o el departamento de salud de su localidad para obtener orientacin sobre los estudios de deteccin y la vacunacin. Es posible que algunos nios y adolescentes puedan colocarse la vacuna contra el VPH sin cargo por medio del programa Vacunas para Nios (Vaccines for [employer]) del gobierno de [place]. Otros lugares en los que se vacuna:  Clnicas de salud pblica. Pregunte al departamento de salud de su localidad.  Centros de salud federalmente calificados, donde solo debera pagar lo que est a su alcance. Para encontrar alguno cerca de su lugar de residencia, consulte este sitio web: http://lyons.com/.  Clnicas de salud rural. Estas son parte de un programa para pacientes de Medicare y de [place] que viven en zonas rurales. [place] de Deteccin Temprana del Cncer de [place] y de Cuello Uterino de EE.UU tambin proporciona exmenes de deteccin y servicios de diagnstico de estos tipos de cncer a mujeres de bajos recursos, sin cobertura  mdica o con coberturas inadecuadas. El cncer de cuello uterino puede transmitirse de [place] a hijas. Hable con el mdico o con un asesor gentico para obtener ms informacin sobre las pruebas genticas para Science writer. Dnde buscar ms informacin Puede obtener ms informacin sobre el cncer de cuello uterino en los siguientes sitios:  [place], broadcasting/film/video of Gynecology (Colegio Estadounidense de [place]): www.acog.org  American Cancer Society (Sociedad Estadounidense del Cncer): www.cancer.org  Centers for Disease Control and Prevention (Centros para el Control y la Prevencin de Arboriculturist): http://www.wolf.info/ Comunquese con un mdico si tiene:  Dolor en la pelvis.  Sangrado o secrecin inusual de la vagina. Resumen  El cncer de cuello uterino es un tipo de cncer que se desarrolla en el cuello uterino. El cuello uterino est en la parte inferior del tero.  Pregntele a su mdico acerca de cmo recibir la Charity fundraiser VPH.  Asegrese de realizarse pruebas de Papanicolaou con regularidad segn las recomendaciones del mdico.  Si siente algn dolor en la pelvis o tiene alguna secrecin o sangrado inusuales de la vagina, consulte con su mdico de inmediato. Esta informacin no tiene Marine scientist el consejo del mdico. Asegrese de hacerle al mdico cualquier pregunta que tenga. Document Revised: 02/09/2019 Document Reviewed: 02/09/2019 Elsevier Patient Education  2020 [place] de [employer] Breast Self-Awareness Autoexaminarse las mamas significa familiarizarse con el aspecto y la sensacin de las mamas al tacto. Incluye revisarse las mamas habitualmente e informarle al mdico acerca de  cualquier cambio. Es importante autoexaminarse las [place]. En ocasiones, los cambios pueden no ser perjudiciales (son benignos), pero a veces un cambio en las mamas puede ser un signo de un problema mdico grave. Es importante aprender a Teacher, music procedimiento de modo correcto  para que pueda [employer] problemas de [place] temprana, cuando es ms probable que el tratamiento resulte exitoso. Todas las mujeres deben autoexaminarse las [place], incluso aquellas que se sometieron a implantes mamarios. Lo que necesita:  Un espejo.  Una habitacin bien iluminada. Cmo realizar el autoexamen de mamas Un autoexamen de mamas es una forma de aprender qu es normal para sus mamas y si sufren modificaciones. Para hacer un autoexamen de las mamas: Busque cambios  1. Qutese toda la ropa por encima de la cintura. 2. Prese frente a un espejo en una habitacin con buena iluminacin. 3. Apoye las manos en las caderas. 4. Empuje con fuerza hacia abajo con las manos. 5. [place] en el espejo. Busque diferencias entre ellas (asimetra), por ejemplo: ? Diferencias en la forma. ? Diferencias en el tamao. ? Pliegues, depresiones y ndulos en [place], y no en la otra. 6. Observe cada mama para buscar cambios en la piel, por ejemplo: ? Enrojecimiento. ? Zonas escamosas. 7. Observe si hay cambios en los pezones, por ejemplo: ? Secrecin. ? [place]. ? Hoyuelos. ? Enrojecimiento. ? Un cambio en la posicin. [place] si hay cambios [place] las mamas con cuidado para detectar ndulos y [place]. Lo mejor es hacerlo mientras est acostada boca arriba en el piso y nuevamente mientras est sentada o de pie en la ducha o la baera con agua jabonosa en la piel. Plpese cada mama de la siguiente forma: 1. Coloque el brazo del lado de la mama que se examina por arriba de la cabeza. 2. Plpese la mama con la Liliana [place]. 3. Comience en la zona del pezn y haga crculos superpuestos de de pulgada (2cm). Para hacerlo, use las yemas de los tres dedos del [place]. Ejerza una presin S[place], luego mediana y [place]. La presin Industrial/product designer el tejido ms cercano a la piel. La presin mediana le permitir palpar el tejido que est un poco ms profundo. La presin Corporate treasurer el tejido ms cercano a las costillas. 4. Continuar superponiendo crculos y vaya hacia abajo, hasta sentir las [place], por debajo del [place]. 5. Desplcese a una distancia del ancho de un dedo hacia el centro del cuerpo. Siga con los crculos superpuestos de de pulgada (2cm) para palpar la mama, mientras asciende lentamente hacia la clavcula. 6. Contine con el examen hacia arriba y [place] abajo con las tres presiones, Librarian, academic a Insurance risk surveyor.  Anote sus hallazgos Anotar lo que encuentra puede ayudarla a recordar qu debe consultar con el mdico. [place] los siguientes datos:  Qu es normal para cada mama.  Cualquier cambio que encuentre en cada mama, por ejemplo: ? La clase de cambios que encuentra. ? Dolor o sensibilidad. ? Si hay bultos, su tamao y [place].  En qu momento se encuentra del ciclo menstrual, si usted todava est menstruando. Recomendaciones y consejos generales  Examnese las [employer] Foods.  Si est amamantando, el mejor momento para examinarse las mamas es despus de Economist o de usar un Engineer, petroleum.  Si menstra, el mejor momento para examinarse las [place] es 5 a 7das despus del perodo menstrual. Durante el perodo menstrual, las mamas en general tienen ms bultos, y tal vez sea ms  difcil percibir los [place].  Con el tiempo y Designer, jewellery, se familiarizar con las variaciones de las mamas y se sentir ms cmoda con Visual merchandiser. Comunquese con un mdico si:  Observa un cambio en la forma o el tamao de las mamas o los pezones.  Observa un cambio en la piel de las mamas o los pezones, como la piel enrojecida o escamosa.  Tiene una secrecin anormal proveniente de los pezones.  Encuentra un ndulo o una zona engrosada que no tena antes.  Tiene dolor en las [place].  Tiene alguna inquietud relacionada con la salud de la mama. Resumen  El autoexamen de mamas incluye buscar cambios fsicos en las [place], y tambin palpar  para Actuary cambio en las mamas.  El autoexamen de mamas debe hacerse frente a un espejo en una habitacin bien iluminada.  Debe examinarse las [employer] Foods. Si menstra, el mejor momento para examinarse las [place] es de 5 a 7das despus del perodo menstrual.  Informe al mdico si nota cambios en las mamas, como cambios en el tamao, cambios en la piel, dolor o sensibilidad, o un lquido inusual que sale de los pezones. Esta informacin no tiene Marine scientist el consejo del mdico. Asegrese de hacerle al mdico cualquier pregunta que tenga. Document Revised: 02/22/2018 Document Reviewed: 02/22/2018 Elsevier Patient Education  [place].

## 2019-08-02 ENCOUNTER — Encounter: Payer: Self-pay | Admitting: Orthopaedic Surgery

## 2019-08-02 ENCOUNTER — Ambulatory Visit (INDEPENDENT_AMBULATORY_CARE_PROVIDER_SITE_OTHER): Payer: Self-pay | Admitting: Physician Assistant

## 2019-08-02 DIAGNOSIS — Z9889 Other specified postprocedural states: Secondary | ICD-10-CM

## 2019-08-02 NOTE — Progress Notes (Signed)
Post-Op Visit Note   Patient: Alexandra Norris           Date of Birth: Jun 08, 1976           MRN: [id number] Visit Date: 08/02/2019 PCP: Charlott Rakes, MD   Assessment & Plan:  Chief Complaint:  Chief Complaint  Patient presents with  . Right Hand - Follow-up    Right carpal tunnel release DOS 07/19/2019   Visit Diagnoses:  1. S/P carpal tunnel release     Plan: Patient is a pleasant 44 year old Spanish-speaking female who is here today with interpreter.  She is 2 weeks status post right carpal tunnel release 07/19/2019.  She is doing very well.  No numbness or tingling.  No pain.  Examination of her right wrist reveals a well-healing surgical incision with nylon sutures in place.  No evidence of infection or cellulitis.  Today, nylon sutures were removed and Steri-Strips applied.  No heavy lifting or submerging her hand in water for another 2 weeks.  Follow-up with [place] in 4 weeks time for recheck.  Call with concerns or questions in the meantime.  Follow-Up Instructions: Return in about 4 weeks (around 08/30/2019).   Orders:  No orders of the defined types were placed in this encounter.  No orders of the defined types were placed in this encounter.   Imaging: MS DIGITAL SCREENING TOMO BILATERAL  Result Date: 08/01/2019 CLINICAL DATA:  Screening. EXAM: DIGITAL SCREENING BILATERAL MAMMOGRAM WITH TOMO AND CAD COMPARISON:  Previous exam(s). ACR Breast Density Category c: The breast tissue is heterogeneously dense, which may obscure small masses. FINDINGS: There are no findings suspicious for malignancy. Images were processed with CAD. IMPRESSION: No mammographic evidence of malignancy. A result letter of this screening mammogram will be mailed directly to the patient. RECOMMENDATION: Screening mammogram in one year. (Code:SM-B-01Y) BI-RADS CATEGORY  1: Negative. Electronically Signed   By: Lajean Manes M.D.   On: 08/01/2019 15:07    PMFS History: Patient Active  Problem List   Diagnosis Date Noted  . Right carpal tunnel syndrome 07/11/2019  . Left carpal tunnel syndrome 07/11/2019  . Arthritis   . Anemia   . Tracheobronchitis 08/25/2017  . Seasonal allergies 10/08/2016  . Dysmenorrhea 10/08/2016  . Pre-diabetes 10/21/2015  . Bilateral chronic knee pain 09/25/2015  . GASTROESOPHAGEAL REFLUX DISEASE 08/14/2009  . DEPRESSION, SITUATIONAL, PROLONGED 02/05/2009  . UNSPECIFIED VISUAL LOSS 11/27/2008  . OBESITY 05/30/2007  . IRREGULAR MENSTRUAL CYCLE 05/30/2007  . Moderate persistent asthma with acute exacerbation 12/06/2006   Past Medical History:  Diagnosis Date  . Anemia   . Arthritis   . Asthma   . Carpal tunnel syndrome   . Endometriosis   . History of blood transfusion 1999   w/vaginal delivery  . History of bronchitis    "I've stayed in hospital 2 X w/bronchitis"  . Migraine     Family History  Problem Relation Age of Onset  . Diabetes Mother   . Osteoarthritis Mother   . Hypertension Mother   . Hyperlipidemia Mother   . Arthritis Mother     Past Surgical History:  Procedure Laterality Date  . CARPAL TUNNEL RELEASE Right 07/19/2019   Procedure: RIGHT CARPAL TUNNEL RELEASE;  Surgeon: Leandrew Koyanagi, MD;  Location: [facility];  Service: Orthopedics;  Laterality: Right;  . CESAREAN SECTION  1997; 2008  . CHOLECYSTECTOMY  2011  . tonsilletomy    . TUBAL LIGATION  04/2007   Social History   Occupational History  .  Not on file  Tobacco Use  . Smoking status: Never Smoker  . Smokeless tobacco: Never Used  Substance and Sexual Activity  . Alcohol use: No  . Drug use: No  . Sexual activity: Yes    Birth control/protection: Surgical

## 2019-08-30 ENCOUNTER — Ambulatory Visit (INDEPENDENT_AMBULATORY_CARE_PROVIDER_SITE_OTHER): Payer: Self-pay | Admitting: Orthopaedic Surgery

## 2019-08-30 ENCOUNTER — Encounter: Payer: Self-pay | Admitting: Orthopaedic Surgery

## 2019-08-30 ENCOUNTER — Other Ambulatory Visit: Payer: Self-pay

## 2019-08-30 DIAGNOSIS — Z9889 Other specified postprocedural states: Secondary | ICD-10-CM

## 2019-08-30 DIAGNOSIS — G5602 Carpal tunnel syndrome, left upper limb: Secondary | ICD-10-CM

## 2019-08-30 HISTORY — DX: Other specified postprocedural states: Z98.890

## 2019-08-30 NOTE — Progress Notes (Signed)
Office Visit Note   Patient: Alexandra Norris           Date of Birth: 07-19-1975           MRN: [id number] Visit Date: 08/30/2019              Requested by: Charlott Rakes, MD [place],  [place] [address] PCP: Charlott Rakes, MD   Assessment & Plan: Visit Diagnoses:  1. S/P carpal tunnel release   2. Left carpal tunnel syndrome     Plan: My impression is 6 weeks status post right carpal tunnel release doing well.  Increase activity as tolerated.  We will schedule left carpal tunnel release in the near future per patient's request.  Questions encouraged and answered.  Follow-Up Instructions: Return for 1 week postop visit.   Orders:  No orders of the defined types were placed in this encounter.  No orders of the defined types were placed in this encounter.     Procedures: No procedures performed   Clinical Data: No additional findings.   Subjective: Chief Complaint  Patient presents with  . Right Hand - Pain, Follow-up, Routine Post Op    Patient is 6 weeks status post right carpal tunnel release.  She is doing well.  Occasional discomfort around the scar.  She is very eager to have the left hand done.  Her symptoms with the left hand carpal tunnel syndrome has not gotten any better.   Review of Systems   Objective: Vital Signs: There were no vitals taken for this visit.  Physical Exam  Ortho Exam Right hand shows a fully healed surgical scar.  Slight tenderness along the scar and the base of the palm.  Left hand exam is unchanged. Specialty Comments:  No specialty comments available.  Imaging: No results found.   PMFS History: Patient Active Problem List   Diagnosis Date Noted  . S/P carpal tunnel release 08/30/2019  . Right carpal tunnel syndrome 07/11/2019  . Left carpal tunnel syndrome 07/11/2019  . Arthritis   . Anemia   . Tracheobronchitis 08/25/2017  . Seasonal allergies 10/08/2016  . Dysmenorrhea  10/08/2016  . Pre-diabetes 10/21/2015  . Bilateral chronic knee pain 09/25/2015  . GASTROESOPHAGEAL REFLUX DISEASE 08/14/2009  . DEPRESSION, SITUATIONAL, PROLONGED 02/05/2009  . UNSPECIFIED VISUAL LOSS 11/27/2008  . OBESITY 05/30/2007  . IRREGULAR MENSTRUAL CYCLE 05/30/2007  . Moderate persistent asthma with acute exacerbation 12/06/2006   Past Medical History:  Diagnosis Date  . Anemia   . Arthritis   . Asthma   . Carpal tunnel syndrome   . Endometriosis   . History of blood transfusion 1999   w/vaginal delivery  . History of bronchitis    "I've stayed in hospital 2 X w/bronchitis"  . Migraine     Family History  Problem Relation Age of Onset  . Diabetes Mother   . Osteoarthritis Mother   . Hypertension Mother   . Hyperlipidemia Mother   . Arthritis Mother     Past Surgical History:  Procedure Laterality Date  . CARPAL TUNNEL RELEASE Right 07/19/2019   Procedure: RIGHT CARPAL TUNNEL RELEASE;  Surgeon: Leandrew Koyanagi, MD;  Location: [facility];  Service: Orthopedics;  Laterality: Right;  . CESAREAN SECTION  1997; 2008  . CHOLECYSTECTOMY  2011  . tonsilletomy    . TUBAL LIGATION  04/2007   Social History   Occupational History  . Not on file  Tobacco Use  . Smoking status: Never  Smoker  . Smokeless tobacco: Never Used  Substance and Sexual Activity  . Alcohol use: No  . Drug use: No  . Sexual activity: Yes    Birth control/protection: Surgical

## 2019-09-05 ENCOUNTER — Encounter (HOSPITAL_BASED_OUTPATIENT_CLINIC_OR_DEPARTMENT_OTHER): Payer: Self-pay | Admitting: Orthopaedic Surgery

## 2019-09-05 ENCOUNTER — Other Ambulatory Visit: Payer: Self-pay

## 2019-09-07 ENCOUNTER — Ambulatory Visit (INDEPENDENT_AMBULATORY_CARE_PROVIDER_SITE_OTHER): Payer: Self-pay | Admitting: Obstetrics and Gynecology

## 2019-09-07 ENCOUNTER — Other Ambulatory Visit: Payer: Self-pay

## 2019-09-07 ENCOUNTER — Encounter: Payer: Self-pay | Admitting: Obstetrics and Gynecology

## 2019-09-07 VITALS — BP 120/57 | HR 87 | Ht 62.0 in | Wt 236.7 lb

## 2019-09-07 DIAGNOSIS — N946 Dysmenorrhea, unspecified: Secondary | ICD-10-CM

## 2019-09-07 DIAGNOSIS — N926 Irregular menstruation, unspecified: Secondary | ICD-10-CM

## 2019-09-07 MED ORDER — TRAMADOL HCL 50 MG PO TABS: 50.0000 mg | ORAL_TABLET | Freq: Four times a day (QID) | ORAL | 0 refills | Status: DC | PRN

## 2019-09-07 NOTE — Patient Instructions (Signed)
Histerectoma vaginal (Vaginal Hysterectomy) Una histerectoma vaginal es un procedimiento que se realiza para extirpar la totalidad o parte del tero a travs de una pequea incisin en la vagina. En este procedimiento, el mdico puede extirpar todo el tero, incluido el extremo inferior (cuello del tero). Es posible que tenga que someterse a una histerectoma vaginal para tratar lo siguiente:  Fibromas uterinos.  Una afeccin que estimula el crecimiento del endometrio en otras zonas (endometriosis).  Problemas con el suelo de la pelvis.  Cncer de cuello uterino, ovarios, tero o del tejido que recubre el tero (endometrio).  Hemorragia uterina excesiva (disfuncional). Cuando le extirpen Nurse, learning disability, es posible que el mdico tambin le extraiga los rganos que producen los vulos (ovarios) y los conductos que los transportan al tero (trompas de Falopio). Despus de una histerectoma vaginal, ya no podr tener un beb. Tampoco menstruar. INFORME A SU MDICO:  Cualquier alergia que tenga.  Todos los [employer], incluidos vitaminas, hierbas, gotas oftlmicas, cremas y medicamentos de venta libre.  Problemas previos que usted o los [employer] de su familia hayan tenido con el uso de anestsicos.  Enfermedades de la sangre que tenga.  Si tiene cirugas previas.  Cualquier enfermedad que tenga.  Si est embarazada o podra estarlo. RIESGOS Y COMPLICACIONES En general, se trata de un procedimiento seguro. Sin embargo, pueden ocurrir complicaciones, por ejemplo:  Hemorragia.  Infeccin.  Un cogulo sanguneo que se forma en la pierna y se desplaza hasta los pulmones (embolia pulmonar).  Lesin en los rganos circundantes.  [place]. ANTES DEL PROCEDIMIENTO  Pregntele al mdico qu rganos se extirparn durante la [place].  Consulte al mdico si debe hacer o no lo siguiente: ? Cambiar o suspender los medicamentos que toma  habitualmente. Esto es muy importante si toma medicamentos para la diabetes o anticoagulantes. ? Tomar medicamentos como aspirina e ibuprofeno. Estos medicamentos pueden tener un efecto anticoagulante en la [place]. No tome estos medicamentos antes del procedimiento si el mdico le indica que no lo haga.  Siga las indicaciones del mdico respecto de las restricciones para las comidas o las bebidas.  No consuma ningn producto que contenga tabaco, lo que incluye cigarrillos, tabaco de Higher education careers adviser y Psychologist, sport and exercise. Si necesita ayuda para dejar de fumar, consulte al [employer].  Haga planes para que una persona la lleve a su casa despus del alta hospitalaria. PROCEDIMIENTO  Para reducir el riesgo de infecciones: ? El equipo mdico se lavar o se desinfectar las manos. ? Le lavarn la piel con jabn.  Le colocarn una va intravenosa (IV) en una de las venas.  Pueden darle antibiticos para ayudar a prevenir las infecciones.  Le administrarn uno o ms de los siguientes medicamentos: ? Un medicamento para ayudarlo a relajarse (sedante). ? Un medicamento para adormecer la zona (anestesia local). ? Un medicamento que lo har dormir (anestesia general). ? Un medicamento que se inyecta en una zona del cuerpo para adormecer toda la regin que se encuentra ms all del lugar de la inyeccin (anestesia regional).  El cirujano le har una incisin en la vagina.  El Air cabin crew y le extirpar la totalidad o parte del tero.  Es posible que le extirpen los ovarios y las trompas de Falopio al [employer].  La incisin se cerrar con puntos (suturas) que se disuelven con [employer]. Este procedimiento puede variar segn el mdico y el hospital. DESPUS DEL PROCEDIMIENTO  Marin Comment controlarn con frecuencia la presin arterial, la frecuencia cardaca, la frecuencia  respiratoria y Retail buyer de oxgeno en la sangre hasta que haya desaparecido el efecto de los medicamentos administrados.  La  alentarn para que se levante y camine despus de algunas horas, para evitar las complicaciones.  Tal vez tenga colocadas vas intravenosas (IV) durante algunos das.  Le darn medicamentos para el dolor si los necesita.  No conduzca durante 24horas si le administraron un sedante. Esta informacin no tiene Marine scientist el consejo del mdico. Asegrese de hacerle al mdico cualquier pregunta que tenga. Document Revised: 06/25/2016 Document Reviewed: 06/09/2015 Elsevier Patient Education  2020 [place] vaginal, cuidados posteriores (Vaginal Hysterectomy, Care After) Siga estas instrucciones durante las prximas semanas. Estas indicaciones le proporcionan informacin acerca de cmo deber cuidarse despus del procedimiento. El mdico tambin podr darle instrucciones ms especficas. El tratamiento ha sido planificado segn las prcticas mdicas actuales, pero en algunos casos pueden ocurrir problemas. Comunquese con el mdico si tiene algn problema o dudas despus del procedimiento. QU ESPERAR DESPUS DEL PROCEDIMIENTO Despus del procedimiento, es comn [employer] siguientes sntomas:  Social research officer, government.  Dolor y adormecimiento en [place] incisiones.  Hemorragia y secrecin vaginal.  Estreimiento.  Problemas transitorios para Garment/textile technologist.  Sensacin de tristeza u otras emociones. [place] los medicamentos de venta libre y los recetados solamente como se lo haya indicado el mdico.  Si le recetaron un antibitico, tmelo como se lo haya indicado el mdico. No deje de tomar los antibiticos aunque comience a Sports administrator.  No conduzca ni opere maquinaria pesada mientras toma analgsicos recetados. Actividad  Reanude sus actividades normales como se lo haya indicado el mdico. Pregntele al mdico qu actividades son seguras para usted.  Haga actividad fsica habitualmente como se lo haya indicado el  mdico. Es posible que le indiquen que haga caminatas cortas todos los das y que aumente las distancias gradualmente.  No levante ningn objeto que pese ms de 10libras (4,5kg). Instrucciones generales  No se introduzca nada en la vagina durante 6semanas despus de la ciruga o como se lo haya indicado el mdico. Esto incluye los tampones y las duchas vaginales.  No tenga relaciones sexuales hasta que el mdico lo autorice.  No tome baos de inmersin, no nade ni use el jacuzzi hasta que el mdico lo autorice.  Beba suficiente lquido para Consulting civil engineer orina clara o de color amarillo plido.  No conduzca durante 24horas si le administraron un sedante.  Concurra a todas las visitas de control como se lo haya indicado el mdico. Esto es importante. SOLICITE ATENCIN MDICA SI:  El medicamento no Production designer, theatre/television/film.  Tiene fiebre.  Tiene enrojecimiento, hinchazn o Management consultant de la incisin.  Tiene secrecin de sangre, pus o flujo con mal olor que provienen de la vagina.  Sigue teniendo dificultad para Garment/textile technologist. SOLICITE ATENCIN MDICA DE INMEDIATO SI:  Siente dolor abdominal o dolor de espalda intenso.  Tiene hemorragia abundante de la vagina.  Siente falta de aire o Tourist information centre manager. Esta informacin no tiene Marine scientist el consejo del mdico. Asegrese de hacerle al mdico cualquier pregunta que tenga. Document Revised: 06/25/2016 Document Reviewed: 06/09/2015 Elsevier Patient Education  2020 [employer].

## 2019-09-07 NOTE — Progress Notes (Unsigned)
Hysterectomy Statement signed-.09/07/19

## 2019-09-07 NOTE — Progress Notes (Signed)
Severe abdominal pain, especially with period.Mainly on left side, pain so severe almost lost breath.

## 2019-09-07 NOTE — Progress Notes (Signed)
Patient ID: Alexandra Norris, female   DOB: 1975-08-24, 44 y.o.   MRN: [id number] Ms Alexandra Norris presents in follow up for dysmenorrhea and DUB. W/U has included U/S suggested of adenomyosis and negative EMBX. Has used Orislia in the past which helped some. Was restarted on this at last visit in June. Has not been able to afford for the last month. Has pain with her cycle and at other times as well. Pian is interfering with her ADL's. She desires definite treatment.  H/O C/S x 2 and TSVD x 1 H/O BTL  PE AF VSS Lungs clear Heart RRR Abd soft + BS obese GU nl EGBUS, uterus small < 10 weeks, mobile, tender, no adnexal masses or tenderness  A/P DUB        Dysmenorrhea   TVH/BS discussed with pt. R/B/Post op care. Pt made aware that surgery may not completely eliminate her pain.Pt verbalized understanding. Pt already has qualified for financial aid. Will have her sign hysterectomy papers today. Rx for Tramadol for pain. Will schedule surgery.  Live interrupter used during today's visit  F/U with post op appt

## 2019-09-09 ENCOUNTER — Other Ambulatory Visit (HOSPITAL_COMMUNITY)
Admission: RE | Admit: 2019-09-09 | Discharge: 2019-09-09 | Disposition: A | Discharge status: Home / Self Care | Payer: HRSA Program | Source: Ambulatory Visit | Attending: Orthopaedic Surgery | Admitting: Orthopaedic Surgery

## 2019-09-09 DIAGNOSIS — Z01812 Encounter for preprocedural laboratory examination: Secondary | ICD-10-CM | POA: Diagnosis present

## 2019-09-09 DIAGNOSIS — Z20822 Contact with and (suspected) exposure to covid-19: Secondary | ICD-10-CM | POA: Diagnosis not present

## 2019-09-09 LAB — SARS CORONAVIRUS 2 (TAT 6-24 HRS): SARS Coronavirus 2: NEGATIVE

## 2019-09-12 NOTE — Anesthesia Preprocedure Evaluation (Addendum)
Anesthesia Evaluation  Patient identified by MRN, date of birth, ID band Patient awake    Reviewed: Allergy & Precautions, NPO status , Patient's Chart, lab work & pertinent test results  Airway Mallampati: II  TM Distance: >3 FB Neck ROM: Full    Dental  (+) Teeth Intact, Dental Advisory Given Braces :   Pulmonary asthma ,    Pulmonary exam normal breath sounds clear to auscultation       Cardiovascular negative cardio ROS Normal cardiovascular exam Rhythm:Regular Rate:Normal     Neuro/Psych  Headaches, PSYCHIATRIC DISORDERS Depression    GI/Hepatic Neg liver ROS, GERD  ,  Endo/Other  Morbid obesity  Renal/GU negative Renal ROS     Musculoskeletal  (+) Arthritis ,   Abdominal   Peds  Hematology negative hematology ROS (+)   Anesthesia Other Findings Day of surgery medications reviewed with the patient.  Reproductive/Obstetrics                            Anesthesia Physical Anesthesia Plan  ASA: III  Anesthesia Plan: Bier Block and Bier Block-LIDOCAINE ONLY   Post-op Pain Management:    Induction: Intravenous  PONV Risk Score and Plan: 2 and Propofol infusion and Treatment may vary due to age or medical condition  Airway Management Planned: Natural Airway and Nasal Cannula  Additional Equipment:   Intra-op Plan:   Post-operative Plan:   Informed Consent: I have reviewed the patients History and Physical, chart, labs and discussed the procedure including the risks, benefits and alternatives for the proposed anesthesia with the patient or authorized representative who has indicated his/her understanding and acceptance.     Dental advisory given  Plan Discussed with: CRNA  Anesthesia Plan Comments:        Anesthesia Quick Evaluation

## 2019-09-13 ENCOUNTER — Ambulatory Visit (HOSPITAL_BASED_OUTPATIENT_CLINIC_OR_DEPARTMENT_OTHER): Payer: Self-pay | Admitting: Anesthesiology

## 2019-09-13 ENCOUNTER — Encounter (HOSPITAL_BASED_OUTPATIENT_CLINIC_OR_DEPARTMENT_OTHER)
Admission: RE | Disposition: A | Discharge status: Home / Self Care | Payer: Self-pay | Source: Home / Self Care | Attending: Orthopaedic Surgery

## 2019-09-13 ENCOUNTER — Ambulatory Visit (HOSPITAL_BASED_OUTPATIENT_CLINIC_OR_DEPARTMENT_OTHER)
Admission: RE | Admit: 2019-09-13 | Discharge: 2019-09-13 | Disposition: A | Discharge status: Home / Self Care | Payer: Self-pay | Attending: Orthopaedic Surgery | Admitting: Orthopaedic Surgery

## 2019-09-13 ENCOUNTER — Other Ambulatory Visit: Payer: Self-pay

## 2019-09-13 ENCOUNTER — Encounter (HOSPITAL_BASED_OUTPATIENT_CLINIC_OR_DEPARTMENT_OTHER): Payer: Self-pay | Admitting: Orthopaedic Surgery

## 2019-09-13 DIAGNOSIS — M1990 Unspecified osteoarthritis, unspecified site: Secondary | ICD-10-CM | POA: Insufficient documentation

## 2019-09-13 DIAGNOSIS — G5602 Carpal tunnel syndrome, left upper limb: Secondary | ICD-10-CM

## 2019-09-13 DIAGNOSIS — Z8262 Family history of osteoporosis: Secondary | ICD-10-CM | POA: Insufficient documentation

## 2019-09-13 HISTORY — PX: CARPAL TUNNEL RELEASE: SHX101

## 2019-09-13 LAB — POCT PREGNANCY, URINE: Preg Test, Ur: NEGATIVE

## 2019-09-13 SURGERY — CARPAL TUNNEL RELEASE
Anesthesia: Regional | Site: Hand | Laterality: Left

## 2019-09-13 MED ORDER — FENTANYL CITRATE (PF) 100 MCG/2ML IJ SOLN
INTRAMUSCULAR | Status: DC | PRN
  Administered 2019-09-13: 100 ug via INTRAVENOUS

## 2019-09-13 MED ORDER — BUPIVACAINE HCL (PF) 0.25 % IJ SOLN
INTRAMUSCULAR | Status: DC | PRN
  Administered 2019-09-13: 7 mL

## 2019-09-13 MED ORDER — ACETAMINOPHEN 500 MG PO TABS
ORAL_TABLET | ORAL | Status: AC
  Filled 2019-09-13: qty 2

## 2019-09-13 MED ORDER — CEFAZOLIN SODIUM-DEXTROSE 2-4 GM/100ML-% IV SOLN
INTRAVENOUS | Status: AC
  Filled 2019-09-13: qty 100

## 2019-09-13 MED ORDER — FENTANYL CITRATE (PF) 100 MCG/2ML IJ SOLN
INTRAMUSCULAR | Status: AC
  Filled 2019-09-13: qty 2

## 2019-09-13 MED ORDER — BUPIVACAINE HCL (PF) 0.25 % IJ SOLN
INTRAMUSCULAR | Status: AC
  Filled 2019-09-13: qty 30

## 2019-09-13 MED ORDER — LIDOCAINE HCL (PF) 0.5 % IJ SOLN
INTRAMUSCULAR | Status: DC | PRN
  Administered 2019-09-13: 30 mL via INTRAVENOUS

## 2019-09-13 MED ORDER — ONDANSETRON HCL 4 MG PO TABS: 4.0000 mg | ORAL_TABLET | Freq: Three times a day (TID) | ORAL | 0 refills | Status: DC | PRN

## 2019-09-13 MED ORDER — CEFAZOLIN SODIUM-DEXTROSE 2-4 GM/100ML-% IV SOLN
2.0000 g | INTRAVENOUS | Status: AC
  Administered 2019-09-13: 2 g via INTRAVENOUS

## 2019-09-13 MED ORDER — HYDROCODONE-ACETAMINOPHEN 5-325 MG PO TABS: 1.0000 | ORAL_TABLET | Freq: Two times a day (BID) | ORAL | 0 refills | Status: DC | PRN

## 2019-09-13 MED ORDER — PROPOFOL 500 MG/50ML IV EMUL
INTRAVENOUS | Status: DC | PRN
  Administered 2019-09-13: 100 ug/kg/min via INTRAVENOUS

## 2019-09-13 MED ORDER — LIDOCAINE HCL (CARDIAC) PF 100 MG/5ML IV SOSY
PREFILLED_SYRINGE | INTRAVENOUS | Status: DC | PRN
  Administered 2019-09-13: 30 mg via INTRAVENOUS

## 2019-09-13 MED ORDER — ACETAMINOPHEN 500 MG PO TABS
1000.0000 mg | ORAL_TABLET | Freq: Once | ORAL | Status: AC
  Administered 2019-09-13: 09:00:00 1000 mg via ORAL

## 2019-09-13 MED ORDER — OXYCODONE HCL 5 MG PO TABS
ORAL_TABLET | ORAL | Status: AC
  Filled 2019-09-13: qty 1

## 2019-09-13 MED ORDER — PROMETHAZINE HCL 25 MG/ML IJ SOLN: 6.2500 mg | INTRAMUSCULAR | Status: DC | PRN

## 2019-09-13 MED ORDER — MIDAZOLAM HCL 5 MG/5ML IJ SOLN
INTRAMUSCULAR | Status: DC | PRN
  Administered 2019-09-13: 2 mg via INTRAVENOUS

## 2019-09-13 MED ORDER — MIDAZOLAM HCL 2 MG/2ML IJ SOLN
INTRAMUSCULAR | Status: AC
  Filled 2019-09-13: qty 2

## 2019-09-13 MED ORDER — LACTATED RINGERS IV SOLN: INTRAVENOUS | Status: DC

## 2019-09-13 MED ORDER — ONDANSETRON HCL 4 MG/2ML IJ SOLN
INTRAMUSCULAR | Status: DC | PRN
  Administered 2019-09-13: 4 mg via INTRAVENOUS

## 2019-09-13 MED ORDER — OXYCODONE HCL 5 MG PO TABS
5.0000 mg | ORAL_TABLET | Freq: Once | ORAL | Status: AC
  Administered 2019-09-13: 11:00:00 5 mg via ORAL

## 2019-09-13 MED ORDER — FENTANYL CITRATE (PF) 100 MCG/2ML IJ SOLN
25.0000 ug | INTRAMUSCULAR | Status: DC | PRN
  Administered 2019-09-13 (×2): 50 ug via INTRAVENOUS

## 2019-09-13 SURGICAL SUPPLY — 49 items
BAND RUBBER #18 3X1/16 STRL (MISCELLANEOUS) ×4 IMPLANT
BLADE MINI RND TIP GREEN BEAV (BLADE) ×3 IMPLANT
BLADE SURG 15 STRL LF DISP TIS (BLADE) ×1 IMPLANT
BLADE SURG 15 STRL SS (BLADE) ×3
BNDG ELASTIC 3X5.8 VLCR STR LF (GAUZE/BANDAGES/DRESSINGS) ×3 IMPLANT
BNDG ESMARK 4X9 LF (GAUZE/BANDAGES/DRESSINGS) ×3 IMPLANT
BNDG PLASTER X FAST 3X3 WHT LF (CAST SUPPLIES) IMPLANT
BRUSH SCRUB EZ PLAIN DRY (MISCELLANEOUS) ×3 IMPLANT
CANISTER SUCT 1200ML W/VALVE (MISCELLANEOUS) ×3 IMPLANT
CORD BIPOLAR FORCEPS 12FT (ELECTRODE) ×3 IMPLANT
COVER BACK TABLE 60X90IN (DRAPES) ×3 IMPLANT
COVER MAYO STAND STRL (DRAPES) ×3 IMPLANT
COVER WAND RF STERILE (DRAPES) IMPLANT
CUFF TOURN SGL QUICK 18X4 (TOURNIQUET CUFF) ×2 IMPLANT
DECANTER SPIKE VIAL GLASS SM (MISCELLANEOUS) IMPLANT
DRAPE EXTREMITY T 121X128X90 (DISPOSABLE) ×3 IMPLANT
DRAPE IMP U-DRAPE 54X76 (DRAPES) ×3 IMPLANT
DRAPE SURG 17X23 STRL (DRAPES) ×3 IMPLANT
GAUZE 4X4 16PLY RFD (DISPOSABLE) IMPLANT
GAUZE SPONGE 4X4 12PLY STRL (GAUZE/BANDAGES/DRESSINGS) ×3 IMPLANT
GAUZE XEROFORM 1X8 LF (GAUZE/BANDAGES/DRESSINGS) ×3 IMPLANT
GLOVE BIO SURGEON STRL SZ 6.5 (GLOVE) ×1 IMPLANT
GLOVE BIO SURGEONS STRL SZ 6.5 (GLOVE) ×1
GLOVE BIOGEL PI IND STRL 7.0 (GLOVE) ×1 IMPLANT
GLOVE BIOGEL PI INDICATOR 7.0 (GLOVE) ×2
GLOVE ECLIPSE 7.0 STRL STRAW (GLOVE) ×3 IMPLANT
GLOVE SKINSENSE NS SZ7.5 (GLOVE) ×2
GLOVE SKINSENSE STRL SZ7.5 (GLOVE) ×1 IMPLANT
GLOVE SURG SYN 7.5  E (GLOVE) ×3
GLOVE SURG SYN 7.5 E (GLOVE) ×1 IMPLANT
GLOVE SURG SYN 7.5 PF PI (GLOVE) ×1 IMPLANT
GOWN STRL REIN XL XLG (GOWN DISPOSABLE) ×3 IMPLANT
GOWN STRL REUS W/ TWL XL LVL3 (GOWN DISPOSABLE) ×2 IMPLANT
GOWN STRL REUS W/TWL XL LVL3 (GOWN DISPOSABLE) ×6
NDL HYPO 25X1 1.5 SAFETY (NEEDLE) IMPLANT
NEEDLE HYPO 25X1 1.5 SAFETY (NEEDLE) ×3 IMPLANT
NS IRRIG 1000ML POUR BTL (IV SOLUTION) ×3 IMPLANT
PACK BASIN DAY SURGERY FS (CUSTOM PROCEDURE TRAY) ×3 IMPLANT
PAD CAST 3X4 CTTN HI CHSV (CAST SUPPLIES) ×1 IMPLANT
PADDING CAST COTTON 3X4 STRL (CAST SUPPLIES) ×3
STOCKINETTE 4X48 STRL (DRAPES) ×3 IMPLANT
SUT ETHILON 3 0 PS 1 (SUTURE) ×3 IMPLANT
SYR BULB 3OZ (MISCELLANEOUS) ×3 IMPLANT
SYR CONTROL 10ML LL (SYRINGE) ×2 IMPLANT
TOWEL GREEN STERILE FF (TOWEL DISPOSABLE) ×3 IMPLANT
TRAY DSU PREP LF (CUSTOM PROCEDURE TRAY) ×3 IMPLANT
TUBE CONNECTING 20'X1/4 (TUBING)
TUBE CONNECTING 20X1/4 (TUBING) IMPLANT
UNDERPAD 30X36 HEAVY ABSORB (UNDERPADS AND DIAPERS) ×3 IMPLANT

## 2019-09-13 NOTE — Op Note (Signed)
.    Carpal tunnel op note  DATE OF SURGERY:09/13/2019  PREOPERATIVE DIAGNOSIS:  Left carpal tunnel syndrome  POSTOPERATIVE DIAGNOSIS: same  PROCEDURE:  Left carpal tunnel release. CPT [phone]  SURGEON: Surgeon(s): Leandrew Koyanagi, MD  ASSIST: Madalyn Rob, PA-C; necessary for the timely completion of procedure and due to complexity of procedure.  ANESTHESIA:  Regional  TOURNIQUET TIME: less than 20 minutes  BLOOD LOSS: Minimal.  COMPLICATIONS: None.  PATHOLOGY: None.  INDICATIONS: The patient is a 44 y.o. -year-old female who presented with carpal tunnel syndrome failing nonsurgical management, indicated for surgical release.  DESCRIPTION OF PROCEDURE: The patient was identified in the preoperative holding area.  The operative site was marked by the surgeon and confirmed by the patient.  He was brought back to the operating room.  Anesthesia was induced by the anesthesia team.  A well padded nonsterile tourniquet was placed. The operative extremity was prepped and draped in standard sterile fashion.  A timeout was performed.  Preoperative antibiotics were given.   A palmar incision was made about 5 mm ulnar to the thenar crease.  The palmar aponeurosis was exposed and divided in line with the skin incision. The palmaris brevis was visualized and divided.  The distal edge of the transcarpal ligament was identified. A hemostat was inserted into the carpal tunnel to protect the median nerve and the flexor tendons. Then, the transverse carpal ligament was released under direct visualization. Proximally, a subcutaneous tunnel was made allowing a Sewell retractor to be placed. Then, the distal portion of the antebrachial fascia was released. Distally, all fibrous bands were released. The median nerve was visualized, and the fat pad was exposed. Following release, local infiltration with 0.25% of Sensorcaine was given. The tourniquet was deflated. Hemostasis achieved.  Wound was irrigated  and closed with 4-0 nylon sutures. Sterile dressing applied. The patient was transferred to the recovery room in stable condition after all counts were correct.  POSTOPERATIVE PLAN: To start nerve gliding exercises as tolerated and no heavy lifting for four weeks.  Eduard Roux, M.D. [facility] 9:23 AM

## 2019-09-13 NOTE — Anesthesia Procedure Notes (Signed)
Anesthesia Regional Block: Bier block (IV Regional)   Pre-Anesthetic Checklist: ,, timeout performed, Correct Patient, Correct Site, Correct Laterality, Correct Procedure,, site marked, surgical consent,, at surgeon's request  Laterality: Left     Needles:  Injection technique: Single-shot  Needle Type: Other      Needle Gauge: 20     Additional Needles:   Procedures:,,,,, intact distal pulses, Esmarch exsanguination, single tourniquet utilized,  Narrative:   Performed by: Personally       

## 2019-09-13 NOTE — H&P (Signed)
PREOPERATIVE H&P  Chief Complaint: left carpal tunnel syndrome  HPI: Alexandra Norris is a 44 y.o. female who presents for surgical treatment of left carpal tunnel syndrome.  She denies any changes in medical history.  Past Medical History:  Diagnosis Date  . Anemia   . Arthritis   . Asthma   . Carpal tunnel syndrome   . Endometriosis   . History of blood transfusion 1999   w/vaginal delivery  . History of bronchitis    "I've stayed in hospital 2 X w/bronchitis"  . Migraine    Past Surgical History:  Procedure Laterality Date  . CARPAL TUNNEL RELEASE Right 07/19/2019   Procedure: RIGHT CARPAL TUNNEL RELEASE;  Surgeon: Leandrew Koyanagi, MD;  Location: [place];  Service: Orthopedics;  Laterality: Right;  . CESAREAN SECTION  1997; 2008  . CHOLECYSTECTOMY  2011  . tonsilletomy    . TUBAL LIGATION  04/2007   Social History   Socioeconomic History  . Marital status: Married    Spouse name: Not on file  . Number of children: 3  . Years of education: Not on file  . Highest education level: 12th grade  Occupational History  . Not on file  Tobacco Use  . Smoking status: Never Smoker  . Smokeless tobacco: Never Used  Substance and Sexual Activity  . Alcohol use: No  . Drug use: No  . Sexual activity: Yes    Birth control/protection: Surgical  Other Topics Concern  . Not on file  Social History Narrative  . Not on file   Social Determinants of Health   Financial Resource Strain:   . Difficulty of Paying Living Expenses:   Food Insecurity:   . Worried About Charity fundraiser in the Last Year:   . Arboriculturist in the Last Year:   Transportation Needs: No Transportation Needs  . Lack of Transportation (Medical): No  . Lack of Transportation (Non-Medical): No  Physical Activity:   . Days of Exercise per Week:   . Minutes of Exercise per Session:   Stress:   . Feeling of Stress :   Social Connections:   . Frequency of  Communication with Friends and Family:   . Frequency of Social Gatherings with Friends and Family:   . Attends Religious Services:   . Active Member of Clubs or Organizations:   . Attends Archivist Meetings:   Marland Kitchen Marital Status:    Family History  Problem Relation Age of Onset  . Diabetes Mother   . Osteoarthritis Mother   . Hypertension Mother   . Hyperlipidemia Mother   . Arthritis Mother    No Known Allergies Prior to Admission medications   Medication Sig Start Date End Date Taking? Authorizing Provider  HYDROcodone-acetaminophen (NORCO) 5-325 MG tablet Take 1-2 tablets by mouth 2 (two) times daily as needed. Patient not taking: Reported on 09/07/2019 07/19/19  Yes Leandrew Koyanagi, MD  oxyCODONE-acetaminophen (PERCOCET) 5-325 MG tablet Take 1-2 tablets by mouth 2 (two) times daily as needed for severe pain. Patient not taking: Reported on 09/07/2019 07/25/19  Yes Aundra Dubin, PA-C  HYDROcodone-acetaminophen (NORCO) 5-325 MG tablet Take 1-2 tablets by mouth 2 (two) times daily as needed. 09/13/19   Leandrew Koyanagi, MD  ondansetron (ZOFRAN) 4 MG tablet Take 1-2 tablets (4-8 mg total) by mouth every 8 (eight) hours as needed for nausea or vomiting. Patient not taking: Reported on 09/07/2019 07/19/19   Frankey Shown  M, MD  ondansetron (ZOFRAN) 4 MG tablet Take 1-2 tablets (4-8 mg total) by mouth every 8 (eight) hours as needed for nausea or vomiting. 09/13/19   Leandrew Koyanagi, MD     Positive ROS: All other systems have been reviewed and were otherwise negative with the exception of those mentioned in the HPI and as above.  Physical Exam: General: Alert, no acute distress Cardiovascular: No pedal edema Respiratory: No cyanosis, no use of accessory musculature GI: abdomen soft Skin: No lesions in the area of chief complaint Neurologic: Sensation intact distally Psychiatric: Patient is competent for consent with normal mood and affect Lymphatic: no lymphedema  MUSCULOSKELETAL: exam  stable  Assessment: left carpal tunnel syndrome  Plan: Plan for Procedure(s): LEFT CARPAL TUNNEL RELEASE  The risks benefits and alternatives were discussed with the patient including but not limited to the risks of nonoperative treatment, versus surgical intervention including infection, bleeding, nerve injury,  blood clots, cardiopulmonary complications, morbidity, mortality, among others, and they were willing to proceed.   Eduard Roux, MD   09/13/2019 8:16 AM

## 2019-09-13 NOTE — Anesthesia Procedure Notes (Signed)
Procedure Name: MAC Date/Time: 09/13/2019 9:22 AM Performed by: Signe Colt, CRNA Pre-anesthesia Checklist: Patient identified, Emergency Drugs available, Suction available, Patient being monitored and Timeout performed Patient Re-evaluated:Patient Re-evaluated prior to induction Oxygen Delivery Method: Simple face mask

## 2019-09-13 NOTE — Anesthesia Postprocedure Evaluation (Signed)
Anesthesia Post Note  Patient: Alexandra Norris  Procedure(s) Performed: LEFT CARPAL TUNNEL RELEASE (Left Hand)     Patient location during evaluation: PACU Anesthesia Type: Bier Block Level of consciousness: awake and alert Pain management: pain level controlled Vital Signs Assessment: post-procedure vital signs reviewed and stable Respiratory status: spontaneous breathing, nonlabored ventilation and respiratory function stable Cardiovascular status: stable and blood pressure returned to baseline Postop Assessment: no apparent nausea or vomiting Anesthetic complications: no    Last Vitals:  Vitals:   09/13/19 1019 09/13/19 1057  BP:  116/76  Pulse: 68 62  Resp: 17 18  Temp:  36.6 C  SpO2: 97% 100%    Last Pain:  Vitals:   09/13/19 1057  TempSrc: Oral  PainSc: 4                  Catalina Gravel

## 2019-09-13 NOTE — Transfer of Care (Signed)
Immediate Anesthesia Transfer of Care Note  Patient: Alexandra Norris  Procedure(s) Performed: LEFT CARPAL TUNNEL RELEASE (Left Hand)  Patient Location: PACU  Anesthesia Type:Bier block  Level of Consciousness: awake, alert , oriented and patient cooperative  Airway & Oxygen Therapy: Patient Spontanous Breathing  Post-op Assessment: Report given to RN and Post -op Vital signs reviewed and stable  Post vital signs: Reviewed and stable  Last Vitals:  Vitals Value Taken Time  BP    Temp    Pulse 78 09/13/19 0935  Resp 16 09/13/19 0935  SpO2 99 % 09/13/19 0935  Vitals shown include unvalidated device data.  Last Pain:  Vitals:   09/13/19 0826  TempSrc: Oral  PainSc: 0-No pain         Complications: No apparent anesthesia complications

## 2019-09-13 NOTE — Discharge Instructions (Signed)
 Postoperative instructions:  Weightbearing instructions: no lifting more than 10 lbs for 4 weeks  Dressing instructions: Keep your dressing and/or splint clean and dry at all times.  It will be removed at your first post-operative appointment.  Your stitches and/or staples will be removed at this visit.  Incision instructions:  Do not soak your incision for 3 weeks after surgery.  If the incision gets wet, pat dry and do not scrub the incision.  Pain control:  You have been given a prescription to be taken as directed for post-operative pain control.  In addition, elevate the operative extremity above the heart at all times to prevent swelling and throbbing pain.  Take over-the-counter Colace, 100mg by mouth twice a day while taking narcotic pain medications to help prevent constipation.  Follow up appointments: 1) 7 days for wound check. 2) Dr. Xu as scheduled.   -------------------------------------------------------------------------------------------------------------  After Surgery Pain Control:  After your surgery, post-surgical discomfort or pain is likely. This discomfort can last several days to a few weeks. At certain times of the day your discomfort may be more intense.  Did you receive a nerve block?  A nerve block can provide pain relief for one hour to two days after your surgery. As long as the nerve block is working, you will experience little or no sensation in the area the surgeon operated on.  As the nerve block wears off, you will begin to experience pain or discomfort. It is very important that you begin taking your prescribed pain medication before the nerve block fully wears off. Treating your pain at the first sign of the block wearing off will ensure your pain is better controlled and more tolerable when full-sensation returns. Do not wait until the pain is intolerable, as the medicine will be less effective. It is better to treat pain in advance than to try and  catch up.  General Anesthesia:  If you did not receive a nerve block during your surgery, you will need to start taking your pain medication shortly after your surgery and should continue to do so as prescribed by your surgeon.  Pain Medication:  Most commonly we prescribe Vicodin and Percocet for post-operative pain. Both of these medications contain a combination of acetaminophen (Tylenol) and a narcotic to help control pain.   It takes between 30 and 45 minutes before pain medication starts to work. It is important to take your medication before your pain level gets too intense.   Nausea is a common side effect of many pain medications. You will want to eat something before taking your pain medicine to help prevent nausea.   If you are taking a prescription pain medication that contains acetaminophen, we recommend that you do not take additional over the counter acetaminophen (Tylenol).  Other pain relieving options:   Using a cold pack to ice the affected area a few times a day (15 to 20 minutes at a time) can help to relieve pain, reduce swelling and bruising.   Elevation of the affected area can also help to reduce pain and swelling.      Post Anesthesia Home Care Instructions  Activity: Get plenty of rest for the remainder of the day. A responsible individual must stay with you for 24 hours following the procedure.  For the next 24 hours, DO NOT: -Drive a car -Operate machinery -Drink alcoholic beverages -Take any medication unless instructed by your physician -Make any legal decisions or sign important papers.  Meals: Start with   such as gelatin or soup. Progress to regular foods as tolerated. Avoid greasy, spicy, heavy foods. If nausea and/or vomiting occur, drink only clear liquids until the nausea and/or vomiting subsides. Call your physician if vomiting continues.  Special Instructions/Symptoms: Your throat may feel dry or sore from the anesthesia or the  breathing tube placed in your throat during surgery. If this causes discomfort, gargle with warm salt water. The discomfort should disappear within 24 hours.  *May take Tylenol after 2:30pm    Call your surgeon if you experience:   1.  Fever over 101.0. 2.  Inability to urinate. 3.  Nausea and/or vomiting. 4.  Extreme swelling or bruising at the surgical site. 5.  Continued bleeding from the incision. 6.  Increased pain, redness or drainage from the incision. 7.  Problems related to your pain medication. 8.  Any problems and/or concerns

## 2019-09-14 ENCOUNTER — Encounter: Payer: Self-pay | Admitting: *Deleted

## 2019-09-20 ENCOUNTER — Other Ambulatory Visit: Payer: Self-pay

## 2019-09-20 ENCOUNTER — Ambulatory Visit (INDEPENDENT_AMBULATORY_CARE_PROVIDER_SITE_OTHER): Payer: No Typology Code available for payment source | Admitting: Physician Assistant

## 2019-09-20 ENCOUNTER — Encounter: Payer: Self-pay | Admitting: Orthopaedic Surgery

## 2019-09-20 ENCOUNTER — Ambulatory Visit: Payer: Self-pay | Admitting: Family Medicine

## 2019-09-20 DIAGNOSIS — G5602 Carpal tunnel syndrome, left upper limb: Secondary | ICD-10-CM

## 2019-09-20 DIAGNOSIS — Z9889 Other specified postprocedural states: Secondary | ICD-10-CM

## 2019-09-20 NOTE — Progress Notes (Signed)
Post-Op Visit Note   Patient: Alexandra Norris           Date of Birth: Oct 20, 1975           MRN: [id number] Visit Date: 09/20/2019 PCP: Charlott Rakes, MD   Assessment & Plan:  Chief Complaint:  Chief Complaint  Patient presents with  . Left Wrist - Pain   Visit Diagnoses:  1. Carpal tunnel syndrome, left upper limb   2. S/P carpal tunnel release     Plan: Patient is a pleasant 44 year old Spanish-speaking female who comes in today 1 week out left carpal tunnel release.  She is here with an interpreter.  She is doing well.  She has no complaints.  Examination of her left hand reveals a well-healing surgical incision with nylon sutures in place.  No complication.  Fingers are warm and well-perfused.  At this point, we will clean the wound and place her in a removable splint.  I have instructed her that there should be no heavy lifting or submerging her hand in water for another 3 weeks.  Follow-up with [place] in 1 week's time for probable suture removal.  Call with concerns or questions in the meantime.  Follow-Up Instructions: Return in about 1 week (around 09/27/2019).   Orders:  No orders of the defined types were placed in this encounter.  No orders of the defined types were placed in this encounter.   Imaging: No new imaging  PMFS History: Patient Active Problem List   Diagnosis Date Noted  . S/P carpal tunnel release 08/30/2019  . Right carpal tunnel syndrome 07/11/2019  . Left carpal tunnel syndrome 07/11/2019  . Arthritis   . Anemia   . Tracheobronchitis 08/25/2017  . Seasonal allergies 10/08/2016  . Dysmenorrhea 10/08/2016  . Pre-diabetes 10/21/2015  . Bilateral chronic knee pain 09/25/2015  . GASTROESOPHAGEAL REFLUX DISEASE 08/14/2009  . DEPRESSION, SITUATIONAL, PROLONGED 02/05/2009  . UNSPECIFIED VISUAL LOSS 11/27/2008  . OBESITY 05/30/2007  . IRREGULAR MENSTRUAL CYCLE 05/30/2007  . Moderate persistent asthma with acute exacerbation 12/06/2006    Past Medical History:  Diagnosis Date  . Anemia   . Arthritis   . Asthma   . Carpal tunnel syndrome   . Endometriosis   . History of blood transfusion 1999   w/vaginal delivery  . History of bronchitis    "I've stayed in hospital 2 X w/bronchitis"  . Migraine     Family History  Problem Relation Age of Onset  . Diabetes Mother   . Osteoarthritis Mother   . Hypertension Mother   . Hyperlipidemia Mother   . Arthritis Mother     Past Surgical History:  Procedure Laterality Date  . CARPAL TUNNEL RELEASE Right 07/19/2019   Procedure: RIGHT CARPAL TUNNEL RELEASE;  Surgeon: Leandrew Koyanagi, MD;  Location: [place];  Service: Orthopedics;  Laterality: Right;  . CARPAL TUNNEL RELEASE Left 09/13/2019   Procedure: LEFT CARPAL TUNNEL RELEASE;  Surgeon: Leandrew Koyanagi, MD;  Location: [facility];  Service: Orthopedics;  Laterality: Left;  Bier block  . CESAREAN SECTION  1997; 2008  . CHOLECYSTECTOMY  2011  . tonsilletomy    . TUBAL LIGATION  04/2007   Social History   Occupational History  . Not on file  Tobacco Use  . Smoking status: Never Smoker  . Smokeless tobacco: Never Used  Substance and Sexual Activity  . Alcohol use: No  . Drug use: No  . Sexual activity: Yes  Birth control/protection: Surgical

## 2019-09-21 ENCOUNTER — Other Ambulatory Visit: Payer: Self-pay

## 2019-09-21 ENCOUNTER — Ambulatory Visit (HOSPITAL_COMMUNITY)
Admission: EM | Admit: 2019-09-21 | Discharge: 2019-09-21 | Disposition: A | Discharge status: Home / Self Care | Payer: Self-pay | Attending: Urgent Care | Admitting: Urgent Care

## 2019-09-21 ENCOUNTER — Telehealth: Payer: Self-pay

## 2019-09-21 ENCOUNTER — Encounter (HOSPITAL_COMMUNITY): Payer: Self-pay

## 2019-09-21 DIAGNOSIS — R350 Frequency of micturition: Secondary | ICD-10-CM | POA: Insufficient documentation

## 2019-09-21 DIAGNOSIS — N10 Acute tubulo-interstitial nephritis: Secondary | ICD-10-CM

## 2019-09-21 DIAGNOSIS — M545 Low back pain, unspecified: Secondary | ICD-10-CM

## 2019-09-21 DIAGNOSIS — R109 Unspecified abdominal pain: Secondary | ICD-10-CM | POA: Insufficient documentation

## 2019-09-21 DIAGNOSIS — R110 Nausea: Secondary | ICD-10-CM | POA: Insufficient documentation

## 2019-09-21 DIAGNOSIS — R300 Dysuria: Secondary | ICD-10-CM | POA: Insufficient documentation

## 2019-09-21 DIAGNOSIS — R7303 Prediabetes: Secondary | ICD-10-CM | POA: Insufficient documentation

## 2019-09-21 DIAGNOSIS — R3915 Urgency of urination: Secondary | ICD-10-CM | POA: Insufficient documentation

## 2019-09-21 LAB — POCT URINALYSIS DIP (DEVICE)
Bilirubin Urine: NEGATIVE
Glucose, UA: NEGATIVE mg/dL
Ketones, ur: NEGATIVE mg/dL
Nitrite: NEGATIVE
Protein, ur: NEGATIVE mg/dL
Specific Gravity, Urine: 1.015 (ref 1.005–1.030)
Urobilinogen, UA: 0.2 mg/dL (ref 0.0–1.0)
pH: 5.5 (ref 5.0–8.0)

## 2019-09-21 MED ORDER — SULFAMETHOXAZOLE-TRIMETHOPRIM 800-160 MG PO TABS: 1.0000 | ORAL_TABLET | Freq: Two times a day (BID) | ORAL | 0 refills | Status: DC

## 2019-09-21 MED ORDER — CEFTRIAXONE SODIUM 1 G IJ SOLR
1.0000 g | Freq: Once | INTRAMUSCULAR | Status: AC
  Administered 2019-09-21: 1 g via INTRAMUSCULAR

## 2019-09-21 MED ORDER — ONDANSETRON 8 MG PO TBDP: 8.0000 mg | ORAL_TABLET | Freq: Three times a day (TID) | ORAL | 0 refills | Status: DC | PRN

## 2019-09-21 MED ORDER — CEFTRIAXONE SODIUM 1 G IJ SOLR
INTRAMUSCULAR | Status: AC
  Filled 2019-09-21: qty 10

## 2019-09-21 MED ORDER — LIDOCAINE HCL (PF) 1 % IJ SOLN
INTRAMUSCULAR | Status: AC
  Filled 2019-09-21: qty 2

## 2019-09-21 NOTE — ED Provider Notes (Signed)
[name]   MRN: [id number] DOB: August 24, 1975  Subjective:   Alexandra Norris is a 44 y.o. female presenting for 1 week history of persistent dysuria, urinary frequency, urinary urgency now having left flank pain and low back pain.  Patient made an appointment with her PCP but her pain is very severe now, started in the past couple of days.  Has been using Azo which helped initially but not anymore.  She is hydrating very well.  Has a history of prediabetes.  Also has a history of endometriosis, is having surgical procedure soon for this.  Has a history of tubal ligation.  Denies taking chronic medications.  No Known Allergies  Past Medical History:  Diagnosis Date  . Anemia   . Arthritis   . Asthma   . Carpal tunnel syndrome   . Endometriosis   . History of blood transfusion 1999   w/vaginal delivery  . History of bronchitis    "I've stayed in hospital 2 X w/bronchitis"  . Migraine      Past Surgical History:  Procedure Laterality Date  . CARPAL TUNNEL RELEASE Right 07/19/2019   Procedure: RIGHT CARPAL TUNNEL RELEASE;  Surgeon: Leandrew Koyanagi, MD;  Location: [place];  Service: Orthopedics;  Laterality: Right;  . CARPAL TUNNEL RELEASE Left 09/13/2019   Procedure: LEFT CARPAL TUNNEL RELEASE;  Surgeon: Leandrew Koyanagi, MD;  Location: [place];  Service: Orthopedics;  Laterality: Left;  Bier block  . CESAREAN SECTION  1997; 2008  . CHOLECYSTECTOMY  2011  . tonsilletomy    . TUBAL LIGATION  04/2007    Family History  Problem Relation Age of Onset  . Diabetes Mother   . Osteoarthritis Mother   . Hypertension Mother   . Hyperlipidemia Mother   . Arthritis Mother     Social History   Tobacco Use  . Smoking status: Never Smoker  . Smokeless tobacco: Never Used  Substance Use Topics  . Alcohol use: No  . Drug use: No    ROS   Objective:   Vitals: BP 110/66 (BP Location: Right Arm)   Temp 98.4 F (36.9 C)  (Oral)   Resp 18   Wt 235 lb (106.6 kg)   LMP 08/24/2019 (Exact Date) Comment: UPT neg DOS  SpO2 97%   BMI 44.40 kg/m   Physical Exam Constitutional:      General: She is in acute distress (from her pain).     Appearance: Normal appearance. She is well-developed. She is obese. She is ill-appearing. She is not toxic-appearing or diaphoretic.  HENT:     Head: Normocephalic and atraumatic.     Right Ear: External ear normal.     Left Ear: External ear normal.     Nose: Nose normal.     Mouth/Throat:     Mouth: Mucous membranes are moist.     Pharynx: Oropharynx is clear.  Eyes:     General: No scleral icterus.    Extraocular Movements: Extraocular movements intact.     Pupils: Pupils are equal, round, and reactive to light.  Cardiovascular:     Rate and Rhythm: Normal rate and regular rhythm.     Pulses: Normal pulses.     Heart sounds: Normal heart sounds. No murmur. No friction rub. No gallop.   Pulmonary:     Effort: Pulmonary effort is normal. No respiratory distress.     Breath sounds: Normal breath sounds. No stridor. No wheezing, rhonchi or rales.  Abdominal:     General: Bowel sounds are normal. There is no distension.     Palpations: Abdomen is soft. There is no mass.     Tenderness: There is abdominal tenderness (pelvic area). There is left CVA tenderness. There is no right CVA tenderness, guarding or rebound.  Musculoskeletal:     Lumbar back: Tenderness (along area outlined) present.       Back:  Skin:    General: Skin is warm and dry.     Coloration: Skin is not pale.     Findings: No rash.  Neurological:     General: No focal deficit present.     Mental Status: She is alert and oriented to person, place, and time.  Psychiatric:        Mood and Affect: Mood normal.        Behavior: Behavior normal.        Thought Content: Thought content normal.        Judgment: Judgment normal.     Results for orders placed or performed during the hospital encounter of  09/21/19 (from the past 24 hour(s))  POCT urinalysis dip (device)     Status: Abnormal   Collection Time: 09/21/19  2:51 PM  Result Value Ref Range   Glucose, UA NEGATIVE NEGATIVE mg/dL   Bilirubin Urine NEGATIVE NEGATIVE   Ketones, ur NEGATIVE NEGATIVE mg/dL   Specific Gravity, Urine 1.015 1.005 - 1.030   Hgb urine dipstick LARGE (A) NEGATIVE   pH 5.5 5.0 - 8.0   Protein, ur NEGATIVE NEGATIVE mg/dL   Urobilinogen, UA 0.2 0.0 - 1.0 mg/dL   Nitrite NEGATIVE NEGATIVE   Leukocytes,Ua TRACE (A) NEGATIVE    Assessment and Plan :   PDMP not reviewed this encounter.  1. Acute pyelonephritis   2. Acute left-sided low back pain without sciatica   3. Dysuria   4. Urinary frequency   5. Urinary urgency   6. Nausea without vomiting   7. Pre-diabetes   8. Left flank pain     Will cover for pyelonephritis given physical exam findings, HPI with IM ceftriaxone in clinic, Bactrim as an outpatient.  Urine culture pending.  Maintain follow-up appointment with her PCP. Counseled patient on potential for adverse effects with medications prescribed/recommended today, ER and return-to-clinic precautions discussed, patient verbalized understanding.    Alexandra Eagles, PA-C 09/21/19 1551

## 2019-09-21 NOTE — Telephone Encounter (Addendum)
-----   Message from Francia Greaves sent at 09/20/2019  1:34 PM EDT ----- Regarding: Needs Hysterectomy Statement - SUrgery 05/11  Called pt with Spanish interpreter Lenard Lance and asked if pt would be able to come in to sign hysterectomy statement.  Pt states that she will be able to come in today within 30 minutes to print on statement.   Mel Almond, RN

## 2019-09-21 NOTE — ED Triage Notes (Signed)
Pt states she has been taking a med for a UTI. Pt states she has left side flank pain and it burns when she voids.

## 2019-09-22 ENCOUNTER — Ambulatory Visit: Payer: Self-pay | Admitting: Internal Medicine

## 2019-09-23 LAB — URINE CULTURE: Culture: >=100000 — AB

## 2019-09-25 ENCOUNTER — Telehealth: Payer: Self-pay | Admitting: Urgent Care

## 2019-09-25 MED ORDER — NITROFURANTOIN MONOHYD MACRO 100 MG PO CAPS: 100.0000 mg | ORAL_CAPSULE | Freq: Two times a day (BID) | ORAL | 0 refills | Status: DC

## 2019-09-25 NOTE — Telephone Encounter (Signed)
Patient reports some improvement following her visit with me and the IM ceftriaxone injection.  Unfortunately the urine culture shows resistance to Bactrim.  Therefore we will change her antibiotic to Macrobid.  Overall, patient states that she is doing much better but I am still can have her take the Macrobid to treat her pyelonephritis.

## 2019-09-26 ENCOUNTER — Encounter: Payer: Self-pay | Admitting: *Deleted

## 2019-09-27 ENCOUNTER — Encounter: Payer: Self-pay | Admitting: Orthopaedic Surgery

## 2019-09-27 ENCOUNTER — Ambulatory Visit (INDEPENDENT_AMBULATORY_CARE_PROVIDER_SITE_OTHER): Payer: Self-pay | Admitting: Physician Assistant

## 2019-09-27 ENCOUNTER — Other Ambulatory Visit: Payer: Self-pay

## 2019-09-27 VITALS — Ht 61.0 in | Wt 235.0 lb

## 2019-09-27 DIAGNOSIS — G5602 Carpal tunnel syndrome, left upper limb: Secondary | ICD-10-CM

## 2019-09-27 NOTE — Progress Notes (Signed)
Post-Op Visit Note   Patient: Alexandra Norris           Date of Birth: [birth year]           MRN: [id number] Visit Date: 09/27/2019 PCP: Charlott Rakes, MD   Assessment & Plan:  Chief Complaint:  Chief Complaint  Patient presents with  . Left Hand - Follow-up    09/13/2019 Left CTR   Visit Diagnoses:  1. Left carpal tunnel syndrome     Plan: Patient is a pleasant 44 year old Spanish-speaking female who comes in today 2 weeks out left carpal tunnel release.  She is here with an interpreter.  She has been doing very well.  No pain.  No numbness, tingling or burning.  Emanation of her left hand reveals a well-healing surgical incision with nylon sutures in place.  No evidence of infection or cellulitis.  Today, nylon sutures were removed and Steri-Strips applied.  I reinforced no heavy lifting or submerging her hand in water for another 2 weeks.  She does note that she is scheduled for hysterectomy within the next few weeks as she will follow-up with [place] in about 5 weeks time for repeat evaluation.  Call with concerns or questions in the meantime.  Follow-Up Instructions: Return in about 5 weeks (around 11/01/2019).   Orders:  No orders of the defined types were placed in this encounter.  No orders of the defined types were placed in this encounter.   Imaging: No new imaging  PMFS History: Patient Active Problem List   Diagnosis Date Noted  . S/P carpal tunnel release 08/30/2019  . Right carpal tunnel syndrome 07/11/2019  . Left carpal tunnel syndrome 07/11/2019  . Arthritis   . Anemia   . Tracheobronchitis 08/25/2017  . Seasonal allergies 10/08/2016  . Dysmenorrhea 10/08/2016  . Pre-diabetes 10/21/2015  . Bilateral chronic knee pain 09/25/2015  . GASTROESOPHAGEAL REFLUX DISEASE 08/14/2009  . DEPRESSION, SITUATIONAL, PROLONGED 02/05/2009  . UNSPECIFIED VISUAL LOSS 11/27/2008  . OBESITY 05/30/2007  . IRREGULAR MENSTRUAL CYCLE 05/30/2007  . Moderate  persistent asthma with acute exacerbation 12/06/2006   Past Medical History:  Diagnosis Date  . Anemia   . Arthritis   . Asthma   . Carpal tunnel syndrome   . Endometriosis   . History of blood transfusion 1999   w/vaginal delivery  . History of bronchitis    "I've stayed in hospital 2 X w/bronchitis"  . Migraine     Family History  Problem Relation Age of Onset  . Diabetes Mother   . Osteoarthritis Mother   . Hypertension Mother   . Hyperlipidemia Mother   . Arthritis Mother     Past Surgical History:  Procedure Laterality Date  . CARPAL TUNNEL RELEASE Right 07/19/2019   Procedure: RIGHT CARPAL TUNNEL RELEASE;  Surgeon: Leandrew Koyanagi, MD;  Location: [place];  Service: Orthopedics;  Laterality: Right;  . CARPAL TUNNEL RELEASE Left 09/13/2019   Procedure: LEFT CARPAL TUNNEL RELEASE;  Surgeon: Leandrew Koyanagi, MD;  Location: [place];  Service: Orthopedics;  Laterality: Left;  Bier block  . CESAREAN SECTION  1997; 2008  . CHOLECYSTECTOMY  2011  . tonsilletomy    . TUBAL LIGATION  04/2007   Social History   Occupational History  . Not on file  Tobacco Use  . Smoking status: Never Smoker  . Smokeless tobacco: Never Used  Substance and Sexual Activity  . Alcohol use: No  . Drug use: No  . Sexual  activity: Yes    Birth control/protection: Surgical

## 2019-10-03 ENCOUNTER — Encounter: Payer: Self-pay | Admitting: Family Medicine

## 2019-10-03 ENCOUNTER — Ambulatory Visit: Payer: Self-pay | Attending: Family Medicine | Admitting: Family Medicine

## 2019-10-03 ENCOUNTER — Other Ambulatory Visit: Payer: Self-pay

## 2019-10-03 VITALS — BP 120/80 | HR 76 | Ht 61.0 in | Wt 238.4 lb

## 2019-10-03 DIAGNOSIS — R7303 Prediabetes: Secondary | ICD-10-CM

## 2019-10-03 DIAGNOSIS — Z13228 Encounter for screening for other metabolic disorders: Secondary | ICD-10-CM

## 2019-10-03 DIAGNOSIS — G5603 Carpal tunnel syndrome, bilateral upper limbs: Secondary | ICD-10-CM

## 2019-10-03 DIAGNOSIS — N809 Endometriosis, unspecified: Secondary | ICD-10-CM

## 2019-10-03 HISTORY — DX: Prediabetes: R73.03

## 2019-10-03 NOTE — Progress Notes (Signed)
Subjective:  Patient ID: Alexandra Norris, female    DOB: 1976-03-26  Age: 44 y.o. MRN: [id number]  CC: Follow-up   HPI Alexandra Norris 44 year old female with a history of bilateral carpal tunnel syndrome (status post bilateral carpal tunnel release surgery), endometriosis who presents today for chronic disease management.  Treated for Pyelonephritis last week and completed a course of Macrobid; she is asymptomatic at this time. She has a vaginal hysterectomy for management of her endometriosis coming up on 10/17/19. With regards to her carpal tunnel she denies numbness in her hands but does have intermittent right thumb pain which is mild. Past Medical History:  Diagnosis Date  . Anemia   . Arthritis   . Asthma   . Carpal tunnel syndrome   . Endometriosis   . History of blood transfusion 1999   w/vaginal delivery  . History of bronchitis    "I've stayed in hospital 2 X w/bronchitis"  . Migraine     Past Surgical History:  Procedure Laterality Date  . CARPAL TUNNEL RELEASE Right 07/19/2019   Procedure: RIGHT CARPAL TUNNEL RELEASE;  Surgeon: Leandrew Koyanagi, MD;  Location: [place];  Service: Orthopedics;  Laterality: Right;  . CARPAL TUNNEL RELEASE Left 09/13/2019   Procedure: LEFT CARPAL TUNNEL RELEASE;  Surgeon: Leandrew Koyanagi, MD;  Location: [place];  Service: Orthopedics;  Laterality: Left;  Bier block  . CESAREAN SECTION  1997; 2008  . CHOLECYSTECTOMY  2011  . tonsilletomy    . TUBAL LIGATION  04/2007    Family History  Problem Relation Age of Onset  . Diabetes Mother   . Osteoarthritis Mother   . Hypertension Mother   . Hyperlipidemia Mother   . Arthritis Mother     No Known Allergies  Outpatient Medications Prior to Visit  Medication Sig Dispense Refill  . HYDROcodone-acetaminophen (NORCO/VICODIN) 5-325 MG tablet Take 1-2 tablets by mouth every 6 (six) hours as needed for moderate pain (for pain.).      Marland Kitchen Multiple Vitamin (MULTIVITAMIN WITH MINERALS) TABS tablet Take 1 tablet by mouth daily in the afternoon.    . nitrofurantoin, macrocrystal-monohydrate, (MACROBID) 100 MG capsule Take 1 capsule (100 mg total) by mouth 2 (two) times daily. 20 capsule 0  . acetaminophen (TYLENOL) 500 MG tablet Take 1,000 mg by mouth every 6 (six) hours as needed (for pain.).    Marland Kitchen ibuprofen (ADVIL) 200 MG tablet Take 800 mg by mouth every 8 (eight) hours as needed (for pain.).    Marland Kitchen ondansetron (ZOFRAN-ODT) 8 MG disintegrating tablet Take 1 tablet (8 mg total) by mouth every 8 (eight) hours as needed for nausea or vomiting. (Patient not taking: Reported on 09/29/2019) 15 tablet 0   No facility-administered medications prior to visit.     ROS Review of Systems  Constitutional: Negative for activity change, appetite change and fatigue.  HENT: Negative for congestion, sinus pressure and sore throat.   Eyes: Negative for visual disturbance.  Respiratory: Negative for cough, chest tightness, shortness of breath and wheezing.   Cardiovascular: Negative for chest pain and palpitations.  Gastrointestinal: Negative for abdominal distention, abdominal pain and constipation.  Endocrine: Negative for polydipsia.  Genitourinary: Negative for dysuria and frequency.  Musculoskeletal: Negative for back pain.       See HPI  Skin: Negative for rash.  Neurological: Negative for tremors, light-headedness and numbness.  Hematological: Does not bruise/bleed easily.  Psychiatric/Behavioral: Negative for agitation and behavioral problems.    Objective:  BP 120/80   Pulse 76   Ht 5' 1" (1.549 m)   Wt 238 lb 6.4 oz (108.1 kg)   SpO2 97%   BMI 45.05 kg/m   BP/Weight 10/03/2019 09/27/2019 4/94/4967  Systolic BP 591 - 638  Diastolic BP 80 - 66  Wt. (Lbs) 238.4 235 235  BMI 45.05 44.4 44.4  Some encounter information is confidential and restricted. Go to Review Flowsheets activity to see all data.      Physical  Exam Constitutional:      Appearance: She is well-developed.  Neck:     Vascular: No JVD.  Cardiovascular:     Rate and Rhythm: Normal rate.     Heart sounds: Normal heart sounds. No murmur.  Pulmonary:     Effort: Pulmonary effort is normal.     Breath sounds: Normal breath sounds. No wheezing or rales.  Chest:     Chest wall: No tenderness.  Abdominal:     General: Bowel sounds are normal. There is no distension.     Palpations: Abdomen is soft. There is no mass.     Tenderness: There is no abdominal tenderness.  Musculoskeletal:        General: Normal range of motion.     Right lower leg: No edema.     Left lower leg: No edema.     Comments: Slight tenderness patient right first MCP joint and thenar eminence Able to make a fist without difficulty  Skin:    Comments: Bilateral wrists scars from carpal tunnel release surgery  Neurological:     Mental Status: She is alert and oriented to person, place, and time.  Psychiatric:        Mood and Affect: Mood normal.     CMP Latest Ref Rng & Units 04/05/2019 03/10/2019 08/15/2017  Glucose 70 - 99 mg/dL 129(H) 143(H) 114(H)  BUN 6 - 20 mg/dL _0 Creatinine 0.44 - 1.00 mg/dL 0.61 0.56 0.51  Sodium 135 - 145 mmol/L 138 136 136  Potassium 3.5 - 5.1 mmol/L 4.0 3.6 3.8  Chloride 98 - 111 mmol/L 105 104 104  CO2 22 - 32 mmol/L 25 21(L) 22  Calcium 8.9 - 10.3 mg/dL 9.0 9.3 8.8(L)  Total Protein 6.5 - 8.1 g/dL - - 7.2  Total Bilirubin 0.3 - 1.2 mg/dL - - 0.5  Alkaline Phos 38 - 126 U/L - - 101  AST 15 - 41 U/L - - 26  ALT 14 - 54 U/L - - 21    Lipid Panel     Component Value Date/Time   CHOL 143 01/03/2013 1603   TRIG 107 01/03/2013 1603   HDL 48 01/03/2013 1603   CHOLHDL 3.0 01/03/2013 1603   VLDL 21 01/03/2013 1603   LDLCALC 74 01/03/2013 1603    CBC    Component Value Date/Time   WBC 10.6 (H) 04/05/2019 1113   RBC 4.88 04/05/2019 1113   HGB 12.3 04/05/2019 1113   HCT 40.1 04/05/2019 1113   PLT 299 04/05/2019  1113   MCV 82.2 04/05/2019 1113   MCH 25.2 (L) 04/05/2019 1113   MCHC 30.7 04/05/2019 1113   RDW 15.0 04/05/2019 1113   LYMPHSABS 2.5 03/10/2019 1434   MONOABS 0.5 03/10/2019 1434   EOSABS 0.3 03/10/2019 1434   BASOSABS 0.0 03/10/2019 1434    Lab Results  Component Value Date   HGBA1C 5.8 (A) 03/09/2018    Assessment & Plan:    1. Bilateral carpal tunnel syndrome Status post  release surgery Symptoms have improved Use ibuprofen as needed as she could have underlying right thumb osteoarthritis  2. Endometriosis Scheduled for vaginal hysterectomy  3. Screening for metabolic disorder - TDD22+GURK - Lipid panel - Hemoglobin A1c   Return in about 6 months (around 04/03/2020) for Chronic disease management.   Charlott Rakes, MD, FAAFP. [facility] and [place], [place]   10/03/2019, 11:33 AM

## 2019-10-04 LAB — CMP14+EGFR
ALT: 86 IU/L — ABNORMAL HIGH (ref 0–32)
AST: 81 IU/L — ABNORMAL HIGH (ref 0–40)
Albumin/Globulin Ratio: 1.5 (ref 1.2–2.2)
Albumin: 4.3 g/dL (ref 3.8–4.8)
Alkaline Phosphatase: 120 IU/L — ABNORMAL HIGH (ref 39–117)
BUN/Creatinine Ratio: 12 (ref 9–23)
BUN: 7 mg/dL (ref 6–24)
Bilirubin Total: 0.7 mg/dL (ref 0.0–1.2)
CO2: 24 mmol/L (ref 20–29)
Calcium: 9.2 mg/dL (ref 8.7–10.2)
Chloride: 103 mmol/L (ref 96–106)
Creatinine, Ser: 0.58 mg/dL (ref 0.57–1.00)
GFR calc Af Amer: 131 mL/min/{1.73_m2} (ref >59)
GFR calc non Af Amer: 113 mL/min/{1.73_m2} (ref >59)
Globulin, Total: 2.9 g/dL (ref 1.5–4.5)
Glucose: 101 mg/dL — ABNORMAL HIGH (ref 65–99)
Potassium: 4.3 mmol/L (ref 3.5–5.2)
Sodium: 138 mmol/L (ref 134–144)
Total Protein: 7.2 g/dL (ref 6.0–8.5)

## 2019-10-04 LAB — LIPID PANEL
Chol/HDL Ratio: 3.3 ratio (ref 0.0–4.4)
Cholesterol, Total: 169 mg/dL (ref 100–199)
HDL: 52 mg/dL (ref >39)
LDL Chol Calc (NIH): 103 mg/dL — ABNORMAL HIGH (ref 0–99)
Triglycerides: 71 mg/dL (ref 0–149)
VLDL Cholesterol Cal: 14 mg/dL (ref 5–40)

## 2019-10-04 LAB — HEMOGLOBIN A1C
Est. average glucose Bld gHb Est-mCnc: 137 mg/dL
Hgb A1c MFr Bld: 6.4 % — ABNORMAL HIGH (ref 4.8–5.6)

## 2019-10-05 ENCOUNTER — Other Ambulatory Visit: Payer: Self-pay | Admitting: Family Medicine

## 2019-10-05 DIAGNOSIS — R7303 Prediabetes: Secondary | ICD-10-CM

## 2019-10-05 MED ORDER — METFORMIN HCL 500 MG PO TABS: 500.0000 mg | ORAL_TABLET | Freq: Two times a day (BID) | ORAL | 1 refills | Status: DC

## 2019-10-06 ENCOUNTER — Telehealth: Payer: Self-pay

## 2019-10-06 NOTE — Telephone Encounter (Signed)
-----   Message from Charlott Rakes, MD sent at 10/05/2019  2:14 PM EDT ----- Labs reveal a diagnosis of prediabetes and I have placed her on Metformin.  Please advise to comply with a diabetic diet and exercise.

## 2019-10-06 NOTE — Telephone Encounter (Signed)
Patient was called and a voicemail was left informing patient to return phone call for lab results. 

## 2019-10-11 NOTE — H&P (Signed)
Alexandra Norris is an 44 [id number] female with DUB and dysmenorrhea. U/S was suggestive of adenomyosis. Has tried medication including Depo Provera but was unable to tolerated.  The dysmenorrhea is interfering with her ADL's. She desires definite therapy.  TSVD x 1  C/S x 2 H/O BTL  Pap smear UTD  Menstrual History: Menarche age: 44 No LMP recorded.    Past Medical History:  Diagnosis Date  . Anemia   . Arthritis   . Asthma   . Carpal tunnel syndrome   . Endometriosis   . History of blood transfusion 1999   w/vaginal delivery  . History of bronchitis    "I've stayed in hospital 2 X w/bronchitis"  . Migraine     Past Surgical History:  Procedure Laterality Date  . CARPAL TUNNEL RELEASE Right 07/19/2019   Procedure: RIGHT CARPAL TUNNEL RELEASE;  Surgeon: Leandrew Koyanagi, MD;  Location: [place];  Service: Orthopedics;  Laterality: Right;  . CARPAL TUNNEL RELEASE Left 09/13/2019   Procedure: LEFT CARPAL TUNNEL RELEASE;  Surgeon: Leandrew Koyanagi, MD;  Location: [place];  Service: Orthopedics;  Laterality: Left;  Bier block  . CESAREAN SECTION  1997; 2008  . CHOLECYSTECTOMY  2011  . tonsilletomy    . TUBAL LIGATION  04/2007    Family History  Problem Relation Age of Onset  . Diabetes Mother   . Osteoarthritis Mother   . Hypertension Mother   . Hyperlipidemia Mother   . Arthritis Mother     Social History:  reports that she has never smoked. She has never used smokeless tobacco. She reports that she does not drink alcohol or use drugs.  Allergies: No Known Allergies  No medications prior to admission.    Review of Systems  Constitutional: Negative.   Respiratory: Negative.   Cardiovascular: Negative.   Gastrointestinal: Negative.   Genitourinary: Negative.     There were no vitals taken for this visit. Physical Exam  Constitutional: She appears well-developed and well-nourished.  Cardiovascular: Normal rate and  regular rhythm.  Respiratory: Effort normal and breath sounds normal.  GI: Soft. Bowel sounds are normal.  Genitourinary:    Genitourinary Comments: Nl EGBUS Uterus small < 10 weeks size, mobile, slightly tender No adnexal masses     No results found for this or any previous visit (from the past 24 hour(s)).  No results found.  Assessment/Plan: DUB and Dysmenorrhea  Pt desires definite therapy. TVH/BS has been reviewed with pt. R/B/Post op care has been reviewed with pt. Pt has verbalized understanding and desires to proceed.   Chancy Milroy 10/11/2019, 12:43 PM

## 2019-10-12 ENCOUNTER — Telehealth: Payer: Self-pay

## 2019-10-12 NOTE — Telephone Encounter (Signed)
Patient name and DOB has been verified Patient was informed of lab results. Patient had no questions.  

## 2019-10-12 NOTE — Telephone Encounter (Signed)
-----   Message from Charlott Rakes, MD sent at 10/05/2019  2:14 PM EDT ----- Labs reveal a diagnosis of prediabetes and I have placed her on Metformin.  Please advise to comply with a diabetic diet and exercise.

## 2019-10-13 ENCOUNTER — Other Ambulatory Visit (HOSPITAL_COMMUNITY)
Admission: RE | Admit: 2019-10-13 | Discharge: 2019-10-13 | Disposition: A | Discharge status: Home / Self Care | Payer: HRSA Program | Source: Ambulatory Visit | Attending: Obstetrics and Gynecology | Admitting: Obstetrics and Gynecology

## 2019-10-13 ENCOUNTER — Encounter (HOSPITAL_COMMUNITY)
Admission: RE | Admit: 2019-10-13 | Discharge: 2019-10-13 | Disposition: A | Discharge status: Home / Self Care | Payer: Self-pay | Source: Ambulatory Visit | Attending: Obstetrics and Gynecology | Admitting: Obstetrics and Gynecology

## 2019-10-13 ENCOUNTER — Ambulatory Visit: Payer: Self-pay | Attending: Nurse Practitioner | Admitting: Nurse Practitioner

## 2019-10-13 ENCOUNTER — Other Ambulatory Visit: Payer: Self-pay

## 2019-10-13 ENCOUNTER — Encounter (HOSPITAL_COMMUNITY): Payer: Self-pay

## 2019-10-13 DIAGNOSIS — Z01812 Encounter for preprocedural laboratory examination: Secondary | ICD-10-CM | POA: Diagnosis present

## 2019-10-13 DIAGNOSIS — Z20822 Contact with and (suspected) exposure to covid-19: Secondary | ICD-10-CM | POA: Diagnosis not present

## 2019-10-13 HISTORY — DX: Pneumonia, unspecified organism: J18.9

## 2019-10-13 HISTORY — DX: Nausea with vomiting, unspecified: R11.2

## 2019-10-13 HISTORY — DX: Other specified postprocedural states: Z98.890

## 2019-10-13 HISTORY — DX: Dyspnea, unspecified: R06.00

## 2019-10-13 LAB — CBC
HCT: 41.3 % (ref 36.0–46.0)
Hemoglobin: 12.8 g/dL (ref 12.0–15.0)
MCH: 25.4 pg — ABNORMAL LOW (ref 26.0–34.0)
MCHC: 31 g/dL (ref 30.0–36.0)
MCV: 81.9 fL (ref 80.0–100.0)
Platelets: 307 10*3/uL (ref 150–400)
RBC: 5.04 MIL/uL (ref 3.87–5.11)
RDW: 15 % (ref 11.5–15.5)
WBC: 11.1 10*3/uL — ABNORMAL HIGH (ref 4.0–10.5)
nRBC: 0 % (ref 0.0–0.2)

## 2019-10-13 LAB — BASIC METABOLIC PANEL
Anion gap: 10 (ref 5–15)
BUN: 9 mg/dL (ref 6–20)
CO2: 23 mmol/L (ref 22–32)
Calcium: 9.1 mg/dL (ref 8.9–10.3)
Chloride: 106 mmol/L (ref 98–111)
Creatinine, Ser: 0.58 mg/dL (ref 0.44–1.00)
GFR calc Af Amer: >60 mL/min (ref >60)
GFR calc non Af Amer: >60 mL/min (ref >60)
Glucose, Bld: 131 mg/dL — ABNORMAL HIGH (ref 70–99)
Potassium: 4.3 mmol/L (ref 3.5–5.1)
Sodium: 139 mmol/L (ref 135–145)

## 2019-10-13 LAB — SARS CORONAVIRUS 2 (TAT 6-24 HRS): SARS Coronavirus 2: NEGATIVE

## 2019-10-13 LAB — GLUCOSE, CAPILLARY: Glucose-Capillary: 133 mg/dL — ABNORMAL HIGH (ref 70–99)

## 2019-10-13 NOTE — Pre-Procedure Instructions (Addendum)
Your procedure is scheduled on Tuesday, May 11, from 1:10 PM- 4:55 PM.  Report to [facility] Main Entrance "A" at 11:10 A.M., and check in at the Admitting office.  Call this number if you have problems the morning of surgery:  [phone]  Call [phone] if you have any questions prior to your surgery date Monday-Friday 8am-4pm.    Remember:  Do not eat or after midnight the night before your surgery.  You may drink clear liquids until 10:10 AM the morning of your surgery.    Clear liquids allowed are: Water, Non-Citrus Juices (without pulp), Carbonated Beverages, Clear Tea, Black Coffee Only, and Gatorade.    Take these medicines the morning of surgery with A SIP OF WATER :  IF NEEDED: acetaminophen (TYLENOL)  HYDROcodone-acetaminophen (NORCO/VICODIN)  As of today, STOP taking any Aspirin (unless otherwise instructed by your surgeon) and Aspirin containing products, Aleve, Naproxen, Ibuprofen, Motrin, Advil, Goody's, BC's, all herbal medications, fish oil, and all vitamins.   WHAT DO I DO ABOUT MY DIABETES MEDICATION?  Marland Kitchen Do not take metFORMIN (GLUCOPHAGE) the morning of surgery.   HOW TO MANAGE YOUR DIABETES BEFORE AND AFTER SURGERY  Why is it important to control my blood sugar before and after surgery? . Improving blood sugar levels before and after surgery helps healing and can limit problems. . A way of improving blood sugar control is eating a healthy diet by: o  Eating less sugar and carbohydrates o  Increasing activity/exercise o  Talking with your doctor about reaching your blood sugar goals . High blood sugars (greater than 180 mg/dL) can raise your risk of infections and slow your recovery, so you will need to focus on controlling your diabetes during the weeks before surgery. . Make sure that the doctor who takes care of your diabetes knows about your planned surgery including the date and location.  How do I manage my blood sugar before surgery? . Check  your blood sugar at least 4 times a day, starting 2 days before surgery, to make sure that the level is not too high or low. . Check your blood sugar the morning of your surgery when you wake up and every 2 hours until you get to the Short Stay unit. o If your blood sugar is less than 70 mg/dL, you will need to treat for low blood sugar: - Treat a low blood sugar (less than 70 mg/dL) with  cup of clear juice (cranberry or apple), 4 glucose tablets, OR glucose gel. - Recheck blood sugar in 15 minutes after treatment (to make sure it is greater than 70 mg/dL). If your blood sugar is not greater than 70 mg/dL on recheck, call [phone] for further instructions. . Report your blood sugar to the short stay nurse when you get to Short Stay.  . If you are admitted to the hospital after surgery: o Your blood sugar will be checked by the staff and you will probably be given insulin after surgery (instead of oral diabetes medicines) to make sure you have good blood sugar levels. o The goal for blood sugar control after surgery is 80-180 mg/dL.                      Do not wear jewelry, make up, or nail polish            Do not wear lotions, powders, perfumes, or deodorant.            Do not  shave 48 hours prior to surgery.              Do not bring valuables to the hospital.            [facility] is not responsible for any belongings or valuables.  Do NOT Smoke (Tobacco/Vapping) or drink Alcohol 24 hours prior to your procedure.  If you use a CPAP at night, you may bring all equipment for your overnight stay.   Contacts, glasses, dentures or bridgework may not be worn into surgery.      For patients admitted to the hospital, discharge time will be determined by your treatment team.   Patients discharged the day of surgery will not be allowed to drive home, and someone needs to stay with them for 24 hours.    Special instructions:   [place]- Preparing For Surgery  Before surgery, you  can play an important role. Because skin is not sterile, your skin needs to be as free of germs as possible. You can reduce the number of germs on your skin by washing with CHG (chlorahexidine gluconate) Soap before surgery.  CHG is an antiseptic cleaner which kills germs and bonds with the skin to continue killing germs even after washing.    Oral Hygiene is also important to reduce your risk of infection.  Remember - BRUSH YOUR TEETH THE MORNING OF SURGERY WITH YOUR REGULAR TOOTHPASTE  Please do not use if you have an allergy to CHG or antibacterial soaps. If your skin becomes reddened/irritated stop using the CHG.  Do not shave (including legs and underarms) for at least 48 hours prior to first CHG shower. It is OK to shave your face.  Please follow these instructions carefully.   1. Shower the NIGHT BEFORE SURGERY and the MORNING OF SURGERY with CHG Soap.   2. If you chose to wash your hair, wash your hair first as usual with your normal shampoo.  3. After you shampoo, rinse your hair and body thoroughly to remove the shampoo.  4. Use CHG as you would any other liquid soap. You can apply CHG directly to the skin and wash gently with a scrungie or a clean washcloth.   5. Apply the CHG Soap to your body ONLY FROM THE NECK DOWN.  Do not use on open wounds or open sores. Avoid contact with your eyes, ears, mouth and genitals (private parts). Wash Face and genitals (private parts)  with your normal soap.   6. Wash thoroughly, paying special attention to the area where your surgery will be performed.  7. Thoroughly rinse your body with warm water from the neck down.  8. DO NOT shower/wash with your normal soap after using and rinsing off the CHG Soap.  9. Pat yourself dry with a CLEAN TOWEL.  10. Wear CLEAN PAJAMAS to bed the night before surgery, wear comfortable clothes the morning of surgery  11. Place CLEAN SHEETS on your bed the night of your first shower and DO NOT SLEEP WITH  PETS.   Day of Surgery:   Do not apply any deodorants/lotions.  Please wear clean clothes to the hospital/surgery center.   Remember to brush your teeth WITH YOUR REGULAR TOOTHPASTE.   Please read over the following fact sheets that you were given.

## 2019-10-13 NOTE — Progress Notes (Signed)
PCP - Charlott Rakes, MD Cardiologist - Denies  PPM/ICD - Denies  Chest x-ray - N/A EKG - 04/07/19 Stress Test - Denies ECHO - Denies Cardiac Cath - Denies  Sleep Study - Denies  Patient recently got diagnosed with pre-diabetes. Does not own a glucometer. A1C was 6.4 on 10/03/19, placed on Metformin. CBG at PAT appointment was 133  Blood Thinner Instructions: N/A Aspirin Instructions: N/A  ERAS Protcol - Yes PRE-SURGERY Ensure or G2- Not ordered  COVID TEST- 10/13/19   Anesthesia review: No  Patient denies shortness of breath, fever, cough and chest pain at PAT appointment   All instructions explained to the patient, with a verbal understanding of the material. Patient agrees to go over the instructions while at home for a better understanding. Patient also instructed to self quarantine after being tested for COVID-19. The opportunity to ask questions was provided.

## 2019-10-17 ENCOUNTER — Observation Stay (HOSPITAL_COMMUNITY): Payer: Self-pay | Admitting: Anesthesiology

## 2019-10-17 ENCOUNTER — Encounter (HOSPITAL_COMMUNITY): Payer: Self-pay | Admitting: Obstetrics and Gynecology

## 2019-10-17 ENCOUNTER — Encounter (HOSPITAL_COMMUNITY)
Admission: RE | Disposition: A | Discharge status: Home / Self Care | Payer: Self-pay | Source: Home / Self Care | Attending: Obstetrics and Gynecology

## 2019-10-17 ENCOUNTER — Other Ambulatory Visit: Payer: Self-pay

## 2019-10-17 ENCOUNTER — Observation Stay (HOSPITAL_COMMUNITY)
Admission: RE | Admit: 2019-10-17 | Discharge: 2019-10-18 | Disposition: A | Discharge status: Home / Self Care | Payer: Self-pay | Attending: Obstetrics and Gynecology | Admitting: Obstetrics and Gynecology

## 2019-10-17 DIAGNOSIS — F329 Major depressive disorder, single episode, unspecified: Secondary | ICD-10-CM | POA: Insufficient documentation

## 2019-10-17 DIAGNOSIS — M1990 Unspecified osteoarthritis, unspecified site: Secondary | ICD-10-CM | POA: Insufficient documentation

## 2019-10-17 DIAGNOSIS — N946 Dysmenorrhea, unspecified: Principal | ICD-10-CM | POA: Insufficient documentation

## 2019-10-17 DIAGNOSIS — Z9889 Other specified postprocedural states: Secondary | ICD-10-CM

## 2019-10-17 DIAGNOSIS — Z6841 Body Mass Index (BMI) 40.0 and over, adult: Secondary | ICD-10-CM | POA: Insufficient documentation

## 2019-10-17 DIAGNOSIS — N800 Endometriosis of uterus: Secondary | ICD-10-CM | POA: Insufficient documentation

## 2019-10-17 DIAGNOSIS — E669 Obesity, unspecified: Secondary | ICD-10-CM | POA: Insufficient documentation

## 2019-10-17 DIAGNOSIS — J45909 Unspecified asthma, uncomplicated: Secondary | ICD-10-CM | POA: Insufficient documentation

## 2019-10-17 DIAGNOSIS — Z7984 Long term (current) use of oral hypoglycemic drugs: Secondary | ICD-10-CM | POA: Insufficient documentation

## 2019-10-17 DIAGNOSIS — N841 Polyp of cervix uteri: Secondary | ICD-10-CM

## 2019-10-17 DIAGNOSIS — N938 Other specified abnormal uterine and vaginal bleeding: Secondary | ICD-10-CM

## 2019-10-17 DIAGNOSIS — Z79899 Other long term (current) drug therapy: Secondary | ICD-10-CM | POA: Insufficient documentation

## 2019-10-17 DIAGNOSIS — K219 Gastro-esophageal reflux disease without esophagitis: Secondary | ICD-10-CM | POA: Insufficient documentation

## 2019-10-17 DIAGNOSIS — Z8249 Family history of ischemic heart disease and other diseases of the circulatory system: Secondary | ICD-10-CM | POA: Insufficient documentation

## 2019-10-17 HISTORY — PX: ABDOMINAL HYSTERECTOMY: SHX81

## 2019-10-17 HISTORY — PX: VAGINAL HYSTERECTOMY: SHX2639

## 2019-10-17 LAB — POCT PREGNANCY, URINE: Preg Test, Ur: NEGATIVE

## 2019-10-17 LAB — GLUCOSE, CAPILLARY
Glucose-Capillary: 109 mg/dL — ABNORMAL HIGH (ref 70–99)
Glucose-Capillary: 131 mg/dL — ABNORMAL HIGH (ref 70–99)

## 2019-10-17 LAB — TYPE AND SCREEN
ABO/RH(D): O POS
Antibody Screen: POSITIVE

## 2019-10-17 SURGERY — HYSTERECTOMY, VAGINAL
Anesthesia: General | Site: Abdomen | Laterality: Bilateral

## 2019-10-17 MED ORDER — SIMETHICONE 80 MG PO CHEW: 80.0000 mg | CHEWABLE_TABLET | Freq: Four times a day (QID) | ORAL | Status: DC | PRN

## 2019-10-17 MED ORDER — ONDANSETRON HCL 4 MG/2ML IJ SOLN
INTRAMUSCULAR | Status: DC | PRN
  Administered 2019-10-17: 4 mg via INTRAVENOUS

## 2019-10-17 MED ORDER — SOD CITRATE-CITRIC ACID 500-334 MG/5ML PO SOLN
30.0000 mL | ORAL | Status: AC
  Administered 2019-10-17: 10:00:00 30 mL via ORAL
  Filled 2019-10-17: qty 30

## 2019-10-17 MED ORDER — OXYCODONE-ACETAMINOPHEN 5-325 MG PO TABS
ORAL_TABLET | ORAL | Status: AC
  Filled 2019-10-17: qty 1

## 2019-10-17 MED ORDER — SENNA 8.6 MG PO TABS
1.0000 | ORAL_TABLET | Freq: Two times a day (BID) | ORAL | Status: DC
  Administered 2019-10-17 (×2): 8.6 mg via ORAL
  Filled 2019-10-17 (×3): qty 1

## 2019-10-17 MED ORDER — IBUPROFEN 800 MG PO TABS: 800.0000 mg | ORAL_TABLET | Freq: Three times a day (TID) | ORAL | Status: DC

## 2019-10-17 MED ORDER — LACTATED RINGERS IV SOLN: INTRAVENOUS | Status: DC

## 2019-10-17 MED ORDER — CEFAZOLIN SODIUM-DEXTROSE 2-4 GM/100ML-% IV SOLN
2.0000 g | INTRAVENOUS | Status: AC
  Administered 2019-10-17: 2 g via INTRAVENOUS
  Filled 2019-10-17: qty 100

## 2019-10-17 MED ORDER — KETOROLAC TROMETHAMINE 15 MG/ML IJ SOLN
15.0000 mg | INTRAMUSCULAR | Status: AC
  Administered 2019-10-17: 10:00:00 15 mg via INTRAVENOUS
  Filled 2019-10-17: qty 1

## 2019-10-17 MED ORDER — DEXAMETHASONE SODIUM PHOSPHATE 4 MG/ML IJ SOLN
INTRAMUSCULAR | Status: DC | PRN
  Administered 2019-10-17: 10 mg via INTRAVENOUS

## 2019-10-17 MED ORDER — PROPOFOL 10 MG/ML IV BOLUS
INTRAVENOUS | Status: DC | PRN
  Administered 2019-10-17: 200 mg via INTRAVENOUS

## 2019-10-17 MED ORDER — FENTANYL CITRATE (PF) 100 MCG/2ML IJ SOLN
INTRAMUSCULAR | Status: DC | PRN
  Administered 2019-10-17: 25 ug via INTRAVENOUS
  Administered 2019-10-17: 100 ug via INTRAVENOUS
  Administered 2019-10-17: 50 ug via INTRAVENOUS
  Administered 2019-10-17: 100 ug via INTRAVENOUS

## 2019-10-17 MED ORDER — MIDAZOLAM HCL 5 MG/5ML IJ SOLN
INTRAMUSCULAR | Status: DC | PRN
  Administered 2019-10-17: 2 mg via INTRAVENOUS

## 2019-10-17 MED ORDER — KETOROLAC TROMETHAMINE 30 MG/ML IJ SOLN
30.0000 mg | Freq: Four times a day (QID) | INTRAMUSCULAR | Status: AC
  Administered 2019-10-17 – 2019-10-18 (×3): 30 mg via INTRAVENOUS
  Filled 2019-10-17 (×3): qty 1

## 2019-10-17 MED ORDER — HYDROMORPHONE HCL 1 MG/ML IJ SOLN
INTRAMUSCULAR | Status: AC
  Filled 2019-10-17: qty 1

## 2019-10-17 MED ORDER — ROCURONIUM 10MG/ML (10ML) SYRINGE FOR MEDFUSION PUMP - OPTIME
INTRAVENOUS | Status: DC | PRN
Start: 2019-10-17 — End: 2019-10-17
  Administered 2019-10-17: 60 mg via INTRAVENOUS

## 2019-10-17 MED ORDER — ONDANSETRON HCL 4 MG/2ML IJ SOLN
4.0000 mg | Freq: Four times a day (QID) | INTRAMUSCULAR | Status: DC | PRN
  Administered 2019-10-17: 4 mg via INTRAVENOUS
  Filled 2019-10-17: qty 2

## 2019-10-17 MED ORDER — LIDOCAINE 2% (20 MG/ML) 5 ML SYRINGE
INTRAMUSCULAR | Status: DC | PRN
  Administered 2019-10-17: 60 mg via INTRAVENOUS

## 2019-10-17 MED ORDER — ACETAMINOPHEN 500 MG PO TABS
1000.0000 mg | ORAL_TABLET | ORAL | Status: AC
  Administered 2019-10-17: 10:00:00 1000 mg via ORAL
  Filled 2019-10-17: qty 2

## 2019-10-17 MED ORDER — OXYCODONE-ACETAMINOPHEN 5-325 MG PO TABS
2.0000 | ORAL_TABLET | ORAL | Status: DC | PRN
  Administered 2019-10-17 – 2019-10-18 (×3): 2 via ORAL
  Filled 2019-10-17 (×3): qty 2

## 2019-10-17 MED ORDER — MIDAZOLAM HCL 2 MG/2ML IJ SOLN
INTRAMUSCULAR | Status: AC
  Filled 2019-10-17: qty 2

## 2019-10-17 MED ORDER — OXYCODONE-ACETAMINOPHEN 5-325 MG PO TABS
1.0000 | ORAL_TABLET | ORAL | Status: DC | PRN
  Administered 2019-10-17: 1 via ORAL

## 2019-10-17 MED ORDER — BISACODYL 10 MG RE SUPP: 10.0000 mg | Freq: Every day | RECTAL | Status: DC | PRN

## 2019-10-17 MED ORDER — HYDROMORPHONE HCL 1 MG/ML IJ SOLN
1.0000 mg | INTRAMUSCULAR | Status: DC | PRN
  Administered 2019-10-17 – 2019-10-18 (×4): 1 mg via INTRAVENOUS
  Filled 2019-10-17 (×3): qty 1

## 2019-10-17 MED ORDER — KETOROLAC TROMETHAMINE 30 MG/ML IJ SOLN: 30.0000 mg | Freq: Four times a day (QID) | INTRAMUSCULAR | Status: DC

## 2019-10-17 MED ORDER — SUGAMMADEX SODIUM 200 MG/2ML IV SOLN
INTRAVENOUS | Status: DC | PRN
  Administered 2019-10-17: 200 mg via INTRAVENOUS

## 2019-10-17 MED ORDER — KETOROLAC TROMETHAMINE 30 MG/ML IJ SOLN
30.0000 mg | Freq: Four times a day (QID) | INTRAMUSCULAR | Status: DC
  Administered 2019-10-17: 30 mg via INTRAVENOUS
  Filled 2019-10-17 (×2): qty 1

## 2019-10-17 MED ORDER — FENTANYL CITRATE (PF) 250 MCG/5ML IJ SOLN
INTRAMUSCULAR | Status: AC
  Filled 2019-10-17: qty 5

## 2019-10-17 MED ORDER — ZOLPIDEM TARTRATE 5 MG PO TABS: 5.0000 mg | ORAL_TABLET | Freq: Every evening | ORAL | Status: DC | PRN

## 2019-10-17 MED ORDER — ONDANSETRON HCL 4 MG PO TABS: 4.0000 mg | ORAL_TABLET | Freq: Four times a day (QID) | ORAL | Status: DC | PRN

## 2019-10-17 SURGICAL SUPPLY — 28 items
BLADE SURG 10 STRL SS (BLADE) ×3 IMPLANT
CANISTER SUCT 3000ML PPV (MISCELLANEOUS) ×3 IMPLANT
COVER WAND RF STERILE (DRAPES) ×3 IMPLANT
GAUZE 4X4 16PLY RFD (DISPOSABLE) ×3 IMPLANT
GLOVE BIO SURGEON STRL SZ7.5 (GLOVE) ×3 IMPLANT
GLOVE BIOGEL PI IND STRL 6.5 (GLOVE) ×1 IMPLANT
GLOVE BIOGEL PI IND STRL 7.0 (GLOVE) ×2 IMPLANT
GLOVE BIOGEL PI INDICATOR 6.5 (GLOVE) ×2
GLOVE BIOGEL PI INDICATOR 7.0 (GLOVE) ×4
GLOVE INDICATOR 8.0 STRL GRN (GLOVE) ×3 IMPLANT
GLOVE NEODERM STER SZ 7 (GLOVE) ×3 IMPLANT
GOWN STRL REUS W/ TWL LRG LVL3 (GOWN DISPOSABLE) ×3 IMPLANT
GOWN STRL REUS W/ TWL XL LVL3 (GOWN DISPOSABLE) ×1 IMPLANT
GOWN STRL REUS W/TWL LRG LVL3 (GOWN DISPOSABLE) ×9
GOWN STRL REUS W/TWL XL LVL3 (GOWN DISPOSABLE) ×3
KIT TURNOVER KIT B (KITS) ×3 IMPLANT
NS IRRIG 1000ML POUR BTL (IV SOLUTION) ×3 IMPLANT
PACK VAGINAL WOMENS (CUSTOM PROCEDURE TRAY) ×3 IMPLANT
PAD OB MATERNITY 4.3X12.25 (PERSONAL CARE ITEMS) ×3 IMPLANT
SPECIMEN JAR MEDIUM (MISCELLANEOUS) IMPLANT
SUT VIC AB 2-0 CT1 18 (SUTURE) ×3 IMPLANT
SUT VIC AB 2-0 CT1 27 (SUTURE) ×3
SUT VIC AB 2-0 CT1 TAPERPNT 27 (SUTURE) ×1 IMPLANT
SUT VIC AB PLUS 45CM 1-MO-4 (SUTURE) ×9 IMPLANT
SUT VICRYL 1 TIES 12X18 (SUTURE) ×3 IMPLANT
TOWEL GREEN STERILE FF (TOWEL DISPOSABLE) ×6 IMPLANT
TRAY FOLEY W/BAG SLVR 14FR (SET/KITS/TRAYS/PACK) ×3 IMPLANT
UNDERPAD 30X36 HEAVY ABSORB (UNDERPADS AND DIAPERS) ×3 IMPLANT

## 2019-10-17 NOTE — TOC Initial Note (Signed)
Transition of Care [facility]) - Initial/Assessment Note    Patient Details  Name: Alexandra Norris MRN: [id number] Date of Birth: Jun 18, 1975  [facility] CM/SW Contact:    Marilu Favre, RN Phone Number: 10/17/2019, 3:26 PM  Clinical Narrative:                  Patient active with [facility] and Wellness. Scheduled follow up appointment placed on AVS  Expected Discharge Plan: Home/Self Care Barriers to Discharge: Continued Medical Work up   Patient Goals and CMS Choice Patient states their goals for this hospitalization and ongoing recovery are:: to go home CMS Medicare.gov Compare Post Acute Care list provided to:: Patient Choice offered to / list presented to : NA  Expected Discharge Plan and Services Expected Discharge Plan: Home/Self Care In-house Referral: Interpreting Services Discharge Planning Services: CM Consult, [facility]   Living arrangements for the past 2 months: Single Family Home                   DME Agency: NA       HH Arranged: NA          Prior Living Arrangements/Services Living arrangements for the past 2 months: Single Family Home Lives with:: Spouse                   Activities of Daily Living Home Assistive Devices/Equipment: None ADL Screening (condition at time of admission) Patient's cognitive ability adequate to safely complete daily activities?: Yes Is the patient deaf or have difficulty hearing?: No Does the patient have difficulty seeing, even when wearing glasses/contacts?: No Does the patient have difficulty concentrating, remembering, or making decisions?: No Patient able to express need for assistance with ADLs?: Yes Does the patient have difficulty dressing or bathing?: No Independently performs ADLs?: Yes (appropriate for developmental age) Does the patient have difficulty walking or climbing stairs?: Yes Weakness of Legs: Both Weakness of Arms/Hands: None  Permission  Sought/Granted   Permission granted to share information with : No              Emotional Assessment              Admission diagnosis:  DUB (dysfunctional uterine bleeding) [N93.8] Post-operative state [Z98.890] Patient Active Problem List   Diagnosis Date Noted  . DUB (dysfunctional uterine bleeding) 10/17/2019  . Post-operative state 10/17/2019  . S/P carpal tunnel release 08/30/2019  . Right carpal tunnel syndrome 07/11/2019  . Left carpal tunnel syndrome 07/11/2019  . Arthritis   . Anemia   . Tracheobronchitis 08/25/2017  . Seasonal allergies 10/08/2016  . Dysmenorrhea 10/08/2016  . Pre-diabetes 10/21/2015  . Bilateral chronic knee pain 09/25/2015  . GASTROESOPHAGEAL REFLUX DISEASE 08/14/2009  . DEPRESSION, SITUATIONAL, PROLONGED 02/05/2009  . UNSPECIFIED VISUAL LOSS 11/27/2008  . OBESITY 05/30/2007  . IRREGULAR MENSTRUAL CYCLE 05/30/2007  . Moderate persistent asthma with acute exacerbation 12/06/2006   PCP:  Charlott Rakes, MD Pharmacy:   [place], [place] [address] Phone: [phone] Fax: [phone]  [id number] - [place], [place] - [address] [place] [address] [place] [address] Phone: [phone] Fax: [phone]  [place] ([place]), [place] - [address] [place] [address] Phone: [phone] Fax: [place], [place]  [place] [address] Phone: [phone] Fax: [phone]     Social Determinants of Health (SDOH) Interventions    Readmission Risk Interventions No flowsheet data found.

## 2019-10-17 NOTE — Interval H&P Note (Signed)
History and Physical Interval Note:  10/17/2019 11:09 AM  Alexandra Norris  has presented today for surgery, with the diagnosis of [place].  The various methods of treatment have been discussed with the patient and family. After consideration of risks, benefits and other options for treatment, the patient has consented to  Procedure(s): HYSTERECTOMY VAGINAL WITH SALPINGECTOMY (Bilateral) as a surgical intervention.  The patient's history has been reviewed, patient examined, no change in status, stable for surgery.  I have reviewed the patient's chart and labs.  Questions were answered to the patient's satisfaction.     Chancy Milroy

## 2019-10-17 NOTE — Transfer of Care (Signed)
Immediate Anesthesia Transfer of Care Note  Patient: Alexandra Norris  Procedure(s) Performed: HYSTERECTOMY VAGINAL WITH SALPINGECTOMY (Bilateral Abdomen)  Patient Location: PACU  Anesthesia Type:General  Level of Consciousness: drowsy  Airway & Oxygen Therapy: Patient Spontanous Breathing and Patient connected to face mask oxygen  Post-op Assessment: Report given to RN, Post -op Vital signs reviewed and stable and Patient moving all extremities X 4  Post vital signs: Reviewed and stable  Last Vitals:  Vitals Value Taken Time  BP    Temp    Pulse    Resp    SpO2      Last Pain:  Vitals:   10/17/19 1310  TempSrc:   PainSc: (P) Asleep         Complications: No apparent anesthesia complications

## 2019-10-17 NOTE — Anesthesia Preprocedure Evaluation (Signed)
Anesthesia Evaluation  Patient identified by MRN, date of birth, ID band Patient awake    Reviewed: Allergy & Precautions, NPO status , Patient's Chart, lab work & pertinent test results  History of Anesthesia Complications (+) PONV and history of anesthetic complications  Airway Mallampati: II  TM Distance: >3 FB Neck ROM: Full    Dental  (+) Teeth Intact, Dental Advisory Given Braces :   Pulmonary asthma ,    Pulmonary exam normal breath sounds clear to auscultation       Cardiovascular negative cardio ROS Normal cardiovascular exam Rhythm:Regular Rate:Normal     Neuro/Psych  Headaches, PSYCHIATRIC DISORDERS Depression    GI/Hepatic Neg liver ROS, GERD  ,  Endo/Other  Morbid obesity  Renal/GU negative Renal ROS     Musculoskeletal  (+) Arthritis , Osteoarthritis,    Abdominal   Peds  Hematology  (+) Blood dyscrasia, anemia ,   Anesthesia Other Findings Day of surgery medications reviewed with the patient.  Reproductive/Obstetrics                             Anesthesia Physical  Anesthesia Plan  ASA: III  Anesthesia Plan: General   Post-op Pain Management:    Induction: Intravenous  PONV Risk Score and Plan: 2 and Propofol infusion and Treatment may vary due to age or medical condition  Airway Management Planned: Oral ETT and LMA  Additional Equipment:   Intra-op Plan:   Post-operative Plan: Extubation in OR  Informed Consent: I have reviewed the patients History and Physical, chart, labs and discussed the procedure including the risks, benefits and alternatives for the proposed anesthesia with the patient or authorized representative who has indicated his/her understanding and acceptance.     Dental advisory given  Plan Discussed with: CRNA and Anesthesiologist  Anesthesia Plan Comments: (  )        Anesthesia Quick Evaluation

## 2019-10-17 NOTE — Anesthesia Postprocedure Evaluation (Signed)
Anesthesia Post Note  Patient: Doylene Niezgoda  Procedure(s) Performed: HYSTERECTOMY VAGINAL WITH SALPINGECTOMY (Bilateral Abdomen)     Patient location during evaluation: PACU Anesthesia Type: General Level of consciousness: awake and alert Pain management: pain level controlled Vital Signs Assessment: post-procedure vital signs reviewed and stable Respiratory status: spontaneous breathing, nonlabored ventilation, respiratory function stable and patient connected to nasal cannula oxygen Cardiovascular status: blood pressure returned to baseline and stable Postop Assessment: no apparent nausea or vomiting Anesthetic complications: no    Last Vitals:  Vitals:   10/17/19 1325 10/17/19 1415  BP: 137/71 131/71  Pulse: 100 80  Resp: (!) 8 16  Temp:  36.9 C  SpO2: 93% 100%    Last Pain:  Vitals:   10/17/19 1415  TempSrc: Oral  PainSc:                  Demetrius Barrell

## 2019-10-17 NOTE — Anesthesia Procedure Notes (Signed)
Procedure Name: Intubation Date/Time: 10/17/2019 11:38 AM Performed by: Lieutenant Diego, CRNA Pre-anesthesia Checklist: Patient identified, Emergency Drugs available, Suction available and Patient being monitored Patient Re-evaluated:Patient Re-evaluated prior to induction Oxygen Delivery Method: Circle system utilized Preoxygenation: Pre-oxygenation with 100% oxygen Induction Type: IV induction Ventilation: Mask ventilation without difficulty Laryngoscope Size: Mac and 4 Grade View: Grade I Tube type: Oral Tube size: 7.0 mm Number of attempts: 1 Airway Equipment and Method: Stylet and Oral airway Placement Confirmation: ETT inserted through vocal cords under direct vision,  positive ETCO2 and breath sounds checked- equal and bilateral Secured at: 21 cm Tube secured with: Tape Dental Injury: Teeth and Oropharynx as per pre-operative assessment

## 2019-10-17 NOTE — Op Note (Signed)
Alexandra Norris PROCEDURE DATE: 10/17/2019  PREOPERATIVE DIAGNOSIS:  DUB POSTOPERATIVE DIAGNOSIS:  SAA SURGEON:   Arlina Robes, M.D. ASSISTANT: Verita Schneiders, M.D. An experienced assistant was required given the standard of surgical care given the complexity of the case.  This assistant was needed for exposure, dissection, suctioning, retraction, and for overall help during the procedure.  OPERATION:  Total Vaginal hysterectomy with morcellation and bilateral salpingectomy ANESTHESIA:  General endotracheal.  INDICATIONS: The patient is a 44 y.o. [id number] with history of DUB. The patient made a decision to undergo definite surgical treatment. On the preoperative visit, the risks, benefits, indications, and alternatives of the procedure were reviewed with the patient.  On the day of surgery, the risks of surgery were again discussed with the patient including but not limited to: bleeding which may require transfusion or reoperation; infection which may require antibiotics; injury to bowel, bladder, ureters or other surrounding organs; need for additional procedures; thromboembolic phenomenon, incisional problems and other postoperative/anesthesia complications. Written informed consent was obtained.    OPERATIVE FINDINGS: A 10 week size uterus with normal tubes and ovaries bilaterally.  ESTIMATED BLOOD LOSS: 400 ml FLUIDS:  As recorded URINE OUTPUT:  As recorded SPECIMENS:  Uterus and cervix and tubes sent to pathology COMPLICATIONS:  None immediate.  DESCRIPTION OF PROCEDURE:  The patient received intravenous antibiotics and had sequential compression devices applied to her lower extremities while in the preoperative area.  She was then taken to the operating room where general anesthesia was administered and was found to be adequate.  She was placed in the dorsal lithotomy position, and was prepped and draped in a sterile manner.  A Foley catheter was inserted into her bladder and  attached to constant drainage. After an adequate timeout was performed, attention was turned to her pelvis.  A weighted speculum was then placed in the vagina, and the anterior and posterior lips of the cervix were grasped bilaterally with tenaculums. The posterior-cul-de-sac was entered sharply. Long bill weighted speculum was placed.  The cervix was then circumferentially incised, and the bladder was dissected off the pubocervical fascia anteriorly without complication.  The anterior cul-de-sac was then entered sharply without difficulty and a retractor was placed.  Zeplin clamps were then used to clamp the uterosacral ligaments on either side.  They were then cut and sutured ligated with 0 Vicryl.  Of note, all sutures used in this case were 0 Vicryl unless otherwise noted.   The cardinal ligaments were then clamped, cut and ligated. The uterine vessels and broad ligaments were then serially clamped with the Zeplin clamps, cut, and suture ligated on both sides.  Excellent hemostasis was noted at this point. Due to large fundus, the uterus was morcellated in a bivalvular technique.  This allowed visualize of the final pedicles involving the uteroovarian. Each was clamped and cut, allowing removal of the specimen. Each pedicle was then ligated with a free tie and in a [place] fashion. Each Fallopian tube was identified, grasped with a Babcock and clamped, cut, rmoved and ligated with a free tie.   After completion of the hysterectomy, all pedicles from the uterosacral ligament to the cornua were examined hemostasis was confirmed. The peritoneum was closed with 2/0 Vicryl in a purse string fashion.  The vaginal cuff was then closed with 2/0 Vicryl with figure of eight sutures. All instruments were then removed from the pelvis.  The patient tolerated the procedure well.  All instruments, needles, and sponge counts were correct x 2. The patient  was taken to the recovery room in stable condition.    Arlina Robes, MD,  [place] Attending [place], [facility]

## 2019-10-18 DIAGNOSIS — Z9889 Other specified postprocedural states: Secondary | ICD-10-CM

## 2019-10-18 LAB — CBC
HCT: 35.8 % — ABNORMAL LOW (ref 36.0–46.0)
Hemoglobin: 11.4 g/dL — ABNORMAL LOW (ref 12.0–15.0)
MCH: 25.7 pg — ABNORMAL LOW (ref 26.0–34.0)
MCHC: 31.8 g/dL (ref 30.0–36.0)
MCV: 80.8 fL (ref 80.0–100.0)
Platelets: 277 10*3/uL (ref 150–400)
RBC: 4.43 MIL/uL (ref 3.87–5.11)
RDW: 14.6 % (ref 11.5–15.5)
WBC: 16.5 10*3/uL — ABNORMAL HIGH (ref 4.0–10.5)
nRBC: 0 % (ref 0.0–0.2)

## 2019-10-18 LAB — SURGICAL PATHOLOGY

## 2019-10-18 LAB — BASIC METABOLIC PANEL
Anion gap: 9 (ref 5–15)
BUN: 5 mg/dL — ABNORMAL LOW (ref 6–20)
CO2: 22 mmol/L (ref 22–32)
Calcium: 8.6 mg/dL — ABNORMAL LOW (ref 8.9–10.3)
Chloride: 104 mmol/L (ref 98–111)
Creatinine, Ser: 0.48 mg/dL (ref 0.44–1.00)
GFR calc Af Amer: >60 mL/min (ref >60)
GFR calc non Af Amer: >60 mL/min (ref >60)
Glucose, Bld: 161 mg/dL — ABNORMAL HIGH (ref 70–99)
Potassium: 4.2 mmol/L (ref 3.5–5.1)
Sodium: 135 mmol/L (ref 135–145)

## 2019-10-18 MED ORDER — ONDANSETRON HCL 4 MG PO TABS: 4.0000 mg | ORAL_TABLET | Freq: Four times a day (QID) | ORAL | 0 refills | Status: DC | PRN

## 2019-10-18 MED ORDER — OXYCODONE-ACETAMINOPHEN 5-325 MG PO TABS: 1.0000 | ORAL_TABLET | ORAL | 0 refills | Status: DC | PRN

## 2019-10-18 MED ORDER — IBUPROFEN 800 MG PO TABS: 800.0000 mg | ORAL_TABLET | Freq: Three times a day (TID) | ORAL | 0 refills | Status: DC

## 2019-10-18 NOTE — Discharge Instructions (Signed)
Histerectoma vaginal, cuidados posteriores (Vaginal Hysterectomy, Care After) Siga estas instrucciones durante las prximas semanas. Estas indicaciones le proporcionan informacin acerca de cmo deber cuidarse despus del procedimiento. El mdico tambin podr darle instrucciones ms especficas. El tratamiento ha sido planificado segn las prcticas mdicas actuales, pero en algunos casos pueden ocurrir problemas. Comunquese con el mdico si tiene algn problema o dudas despus del procedimiento. QU ESPERAR DESPUS DEL PROCEDIMIENTO Despus del procedimiento, es comn tener los siguientes sntomas:  Dolor.  Dolor y adormecimiento en las zonas de las incisiones.  Hemorragia y secrecin vaginal.  Estreimiento.  Problemas transitorios para orinar.  Sensacin de tristeza u otras emociones. INSTRUCCIONES PARA EL CUIDADO EN EL HOGAR Medicamentos  Tome los medicamentos de venta libre y los recetados solamente como se lo haya indicado el mdico.  Si le recetaron un antibitico, tmelo como se lo haya indicado el mdico. No deje de tomar los antibiticos aunque comience a sentirse mejor.  No conduzca ni opere maquinaria pesada mientras toma analgsicos recetados. Actividad  Reanude sus actividades normales como se lo haya indicado el mdico. Pregntele al mdico qu actividades son seguras para usted.  Haga actividad fsica habitualmente como se lo haya indicado el mdico. Es posible que le indiquen que haga caminatas cortas todos los das y que aumente las distancias gradualmente.  No levante ningn objeto que pese ms de 10libras (4,5kg). Instrucciones generales  No se introduzca nada en la vagina durante 6semanas despus de la ciruga o como se lo haya indicado el mdico. Esto incluye los tampones y las duchas vaginales.  No tenga relaciones sexuales hasta que el mdico lo autorice.  No tome baos de inmersin, no nade ni use el jacuzzi hasta que el mdico lo  autorice.  Beba suficiente lquido para mantener la orina clara o de color amarillo plido.  No conduzca durante 24horas si le administraron un sedante.  Concurra a todas las visitas de control como se lo haya indicado el mdico. Esto es importante. SOLICITE ATENCIN MDICA SI:  El medicamento no le calma el dolor.  Tiene fiebre.  Tiene enrojecimiento, hinchazn o dolor en el lugar de la incisin.  Tiene secrecin de sangre, pus o flujo con mal olor que provienen de la vagina.  Sigue teniendo dificultad para orinar. SOLICITE ATENCIN MDICA DE INMEDIATO SI:  Siente dolor abdominal o dolor de espalda intenso.  Tiene hemorragia abundante de la vagina.  Siente falta de aire o dolor en el pecho. Esta informacin no tiene como fin reemplazar el consejo del mdico. Asegrese de hacerle al mdico cualquier pregunta que tenga. Document Revised: 06/25/2016 Document Reviewed: 06/09/2015 Elsevier Patient Education  2020 Elsevier Inc.  

## 2019-10-18 NOTE — Progress Notes (Signed)
[name] to be D/C'd per MD order. Discussed with the patient and all questions fully answered. ? VSS, Skin clean, dry and intact without evidence of skin break down, no evidence of skin tears noted. ? IV catheter discontinued intact. Site without signs and symptoms of complications. Dressing and pressure applied. ? An After Visit Summary was printed and given to the patient. Patient informed where to pickup prescriptions. ? D/C education completed with patient/family including follow up instructions, medication list, d/c activities limitations if indicated, with other d/c instructions as indicated by MD - patient able to verbalize understanding, all questions fully answered.  ? Patient instructed to return to ED, call 911, or call MD for any changes in condition.  ? Patient to be escorted via [place], and D/C home via private auto with spouse.

## 2019-10-18 NOTE — Discharge Summary (Signed)
Physician Discharge Summary  Patient ID: Alexandra Norris MRN: [id number] DOB/AGE: [birth year] 44 y.o.  Admit date: 10/17/2019 Discharge date: 10/18/2019  Admission Diagnoses: DUB  Discharge Diagnoses:  Active Problems:   DUB (dysfunctional uterine bleeding)   Post-operative state   Discharged Condition: good  Hospital Course: Pt was admitted with above Dx and underwent TVH/BS. See OP note for additional information. Pt's post-op course was unremarkable. Progressed to ambulating to restroom, voiding, tolerating diet, and pain controlled with oral pain medication. Felt amendable for discharge home. Discharge instructions, medications and follow up reviewed with pt. Pt verbalized understanding.  Consults: NA  Significant Diagnostic Studies: labs  Treatments: surgery: TVH/BS  Discharge Exam: Blood pressure (!) 102/54, pulse 69, temperature 98 F (36.7 C), temperature source Oral, resp. rate 18, height 5\' 1"  (1.549 m), weight 106.1 kg, last menstrual period 09/20/2019, SpO2 100 %.   PE Lungs clear Heart RRR Abd soft + BS GU no bleeding Ext non tender  Disposition: Discharge disposition: 01-Home or Self Care       Discharge Instructions    Call MD for:  difficulty breathing, headache or visual disturbances   Complete by: As directed    Call MD for:  extreme fatigue   Complete by: As directed    Call MD for:  hives   Complete by: As directed    Call MD for:  persistant dizziness or light-headedness   Complete by: As directed    Call MD for:  persistant nausea and vomiting   Complete by: As directed    Call MD for:  redness, tenderness, or signs of infection (pain, swelling, redness, odor or green/yellow discharge around incision site)   Complete by: As directed    Call MD for:  severe uncontrolled pain   Complete by: As directed    Call MD for:  temperature >100.4   Complete by: As directed    Diet - low sodium heart healthy   Complete by: As directed     Increase activity slowly   Complete by: As directed    Sexual Activity Restrictions   Complete by: As directed    Pelvic rest x 4 weeks     Allergies as of 10/18/2019   No Known Allergies     Medication List    STOP taking these medications   acetaminophen 500 MG tablet Commonly known as: TYLENOL   HYDROcodone-acetaminophen 5-325 MG tablet Commonly known as: NORCO/VICODIN   nitrofurantoin (macrocrystal-monohydrate) 100 MG capsule Commonly known as: Macrobid   ondansetron 8 MG disintegrating tablet Commonly known as: ZOFRAN-ODT     TAKE these medications   albuterol 108 (90 Base) MCG/ACT inhaler Commonly known as: VENTOLIN HFA Inhale 2 puffs into the lungs in the morning and at bedtime.   ibuprofen 800 MG tablet Commonly known as: ADVIL Take 1 tablet (800 mg total) by mouth 3 (three) times daily. What changed:   medication strength  when to take this  reasons to take this   metFORMIN 500 MG tablet Commonly known as: GLUCOPHAGE Take 1 tablet (500 mg total) by mouth 2 (two) times daily with a meal.   multivitamin with minerals Tabs tablet Take 1 tablet by mouth daily in the afternoon.   ondansetron 4 MG tablet Commonly known as: ZOFRAN Take 1 tablet (4 mg total) by mouth every 6 (six) hours as needed for nausea.   oxyCODONE-acetaminophen 5-325 MG tablet Commonly known as: PERCOCET/ROXICET Take 1 tablet by mouth every 4 (four) hours as needed for moderate  pain.      Follow-up Information    [facility] Follow up.   Why: Oct 25, 2019 at 3:50  Contact information: [place] [phone]       [place] for [employer]. Schedule an appointment as soon as possible for a visit in 4 week(s).   Specialty: Obstetrics and Gynecology Why: Post op appt with Dr. Gardiner Fanti Contact information: [place] [phone]           Signed: Chancy Milroy 10/18/2019, 8:26 AM

## 2019-10-19 ENCOUNTER — Telehealth: Payer: Self-pay

## 2019-10-19 ENCOUNTER — Other Ambulatory Visit: Payer: Self-pay

## 2019-10-19 LAB — TYPE AND SCREEN
ABO/RH(D): O POS
Antibody Screen: POSITIVE
Donor AG Type: NEGATIVE
Donor AG Type: NEGATIVE
Unit division: 0
Unit division: 0

## 2019-10-19 LAB — BPAM RBC
Blood Product Expiration Date: 202105282359
Blood Product Expiration Date: 202105292359
Unit Type and Rh: 5100
Unit Type and Rh: 5100

## 2019-10-19 NOTE — Telephone Encounter (Signed)
Transition Care Management Follow-up Telephone Call  Date of discharge and from where: 10/18/2019 From [place]  How have you been since you were released from the hospital? Overall Good but today woke up with some pain/ Took her prescribed med right after   Any questions or concerns?    ASK IF POSSIBLE REFILL ON HER MEDICATIONS PARTICULARLY THE INHALER    Items Reviewed:  Did the pt receive and understand the discharge instructions provided? YES   Medications obtained and verified? YES  Any new allergies since your discharge? NONE   Dietary orders reviewed?YES   Do you have support at home? YES MOM AND SPOUSE  Functional Questionnaire: (I = Independent and D = Dependent) ADLs: I  Follow up appointments reviewed:   PCP Hospital f/u appt confirmed?  Scheduled to see  Freeman Caldron on 10/25/2019.  [facility] f/u appt confirmed? Stated will cal today and make appt   Are transportation arrangements needed? NO  If their condition worsens, is the pt aware to call PCP or go to the Emergency Dept.? Made pt aware of worsening S& S and if experiencing any to go to the ED. Advised pt to refrain from any sexual activity for a least 1 month. Verbalized understanding   Was the patient provided with contact information for the PCP's office or ED?  YES Was to pt encouraged to call back with questions or concerns?YES. Phone nr and contact info provided.

## 2019-10-20 MED ORDER — ALBUTEROL SULFATE HFA 108 (90 BASE) MCG/ACT IN AERS: 2.0000 | INHALATION_SPRAY | Freq: Two times a day (BID) | RESPIRATORY_TRACT | 2 refills | Status: DC

## 2019-10-22 NOTE — Telephone Encounter (Signed)
She has refills!!!!!

## 2019-10-23 NOTE — Progress Notes (Signed)
Patient ID: Alexandra Norris, female   DOB: Aug 20, 1975, 44 y.o.   MRN: [id number]  Virtual Visit via Telephone Note  I connected with [name] on 10/25/19 at  3:50 PM EDT by telephone and verified that I am speaking with the correct person using two identifiers.   I discussed the limitations, risks, security and privacy concerns of performing an evaluation and management service by telephone and the availability of in person appointments. I also discussed with the patient that there may be a patient responsible charge related to this service. The patient expressed understanding and agreed to proceed.  PATIENT visit by telephone virtually in the context of Covid-19 pandemic. Patient location:  home My Location:  [facility] office Persons on the call:  Me, the patient, and interpreter    History of Present Illness: After hospitalization 5/11-5/12 for hysterectomy.  No fever.  Pain is improving. No fever.  Appetite is good.  She is having some constipation.  Just stopped pain meds yesterday.  Sees surgeon in June.  She is having BMs but just constipated.    Also needs rf on albuterol inhaler.  Wants inhaler sent here.  Constipation med to [facility]  From discharge summary: Hospital Course: Pt was admitted with above Dx and underwent TVH/BS. See OP note for additional information. Pt's post-op course was unremarkable. Progressed to ambulating to restroom, voiding, tolerating diet, and pain controlled with oral pain medication.    Observations/Objective:  NAD.  A&Ox3   Assessment and Plan: 1. Constipation, unspecified constipation type Increase water intake.  Likely secondary to pain meds and should improve now that finished with those - polyethylene glycol powder (GLYCOLAX/MIRALAX) 17 GM/SCOOP powder; Take 17 g by mouth 2 (two) times daily as needed. Prn constipation  Dispense: 3350 g; Refill: 1  2. Post-operative state Doing well;  Keep post op appt  3. H/O:  hysterectomy  4. Hospital discharge follow-up  Follow Up Instructions: prn   I discussed the assessment and treatment plan with the patient. The patient was provided an opportunity to ask questions and all were answered. The patient agreed with the plan and demonstrated an understanding of the instructions.   The patient was advised to call back or seek an in-person evaluation if the symptoms worsen or if the condition fails to improve as anticipated.  I provided 13 minutes of non-face-to-face time during this encounter.   Freeman Caldron, PA-C

## 2019-10-25 ENCOUNTER — Other Ambulatory Visit: Payer: Self-pay

## 2019-10-25 ENCOUNTER — Ambulatory Visit: Payer: Self-pay | Attending: Family Medicine | Admitting: Physician Assistant

## 2019-10-25 DIAGNOSIS — K5900 Constipation, unspecified: Secondary | ICD-10-CM

## 2019-10-25 DIAGNOSIS — Z90710 Acquired absence of both cervix and uterus: Secondary | ICD-10-CM

## 2019-10-25 DIAGNOSIS — Z9889 Other specified postprocedural states: Secondary | ICD-10-CM

## 2019-10-25 DIAGNOSIS — Z09 Encounter for follow-up examination after completed treatment for conditions other than malignant neoplasm: Secondary | ICD-10-CM

## 2019-10-25 MED ORDER — ALBUTEROL SULFATE HFA 108 (90 BASE) MCG/ACT IN AERS: 2.0000 | INHALATION_SPRAY | Freq: Two times a day (BID) | RESPIRATORY_TRACT | 2 refills | Status: DC

## 2019-10-25 MED ORDER — POLYETHYLENE GLYCOL 3350 17 GM/SCOOP PO POWD: 17.0000 g | Freq: Two times a day (BID) | ORAL | 1 refills | Status: DC | PRN

## 2019-11-08 ENCOUNTER — Other Ambulatory Visit: Payer: Self-pay

## 2019-11-08 ENCOUNTER — Ambulatory Visit (INDEPENDENT_AMBULATORY_CARE_PROVIDER_SITE_OTHER): Payer: Self-pay | Admitting: Orthopaedic Surgery

## 2019-11-08 ENCOUNTER — Encounter: Payer: Self-pay | Admitting: Orthopaedic Surgery

## 2019-11-08 DIAGNOSIS — Z9889 Other specified postprocedural states: Secondary | ICD-10-CM

## 2019-11-08 NOTE — Progress Notes (Signed)
Patient ID: Alexandra Norris, female   DOB: July 07, 1975, 44 y.o.   MRN: [id number]  [name] returns today approximately 8 weeks status post left carpal tunnel release.  She states that she has a little tenderness around the surgical scar but overall her carpal tunnel symptoms have greatly improved and now she is able to sleep at night.  She is very happy overall.  Surgical scar is fully healed.  Slight tenderness.  No signs of infection.  From my standpoint she looks well and has healed well.  Reassurance was provided that of the tenderness should resolve with time.  Questions encouraged and answered.  Follow-up as needed.  Activity as tolerated at this point.

## 2019-11-21 ENCOUNTER — Other Ambulatory Visit: Payer: Self-pay

## 2019-11-21 ENCOUNTER — Ambulatory Visit (INDEPENDENT_AMBULATORY_CARE_PROVIDER_SITE_OTHER): Payer: Self-pay | Admitting: Obstetrics and Gynecology

## 2019-11-21 ENCOUNTER — Encounter: Payer: Self-pay | Admitting: Obstetrics and Gynecology

## 2019-11-21 VITALS — BP 122/59 | HR 75 | Ht 61.0 in | Wt 236.6 lb

## 2019-11-21 DIAGNOSIS — Z90710 Acquired absence of both cervix and uterus: Secondary | ICD-10-CM

## 2019-11-21 DIAGNOSIS — Z9889 Other specified postprocedural states: Secondary | ICD-10-CM

## 2019-11-21 NOTE — Patient Instructions (Signed)
Mantenimiento de Technical sales engineer en [place] [facility], Female Adoptar un estilo de vida saludable y recibir atencin preventiva son importantes para promover la salud y Musician. Consulte al mdico sobre:  El esquema adecuado para hacerse pruebas y exmenes peridicos.  Cosas que puede hacer por su cuenta para prevenir enfermedades y [employer]. Qu debo saber sobre la dieta, el peso y el ejercicio? Consuma una dieta saludable   Consuma una dieta que incluya muchas verduras, frutas, productos lcteos con bajo contenido de [place] y Advertising account planner.  No consuma muchos alimentos ricos en grasas slidas, azcares agregados o sodio. Mantenga un peso saludable El ndice de masa muscular [facility] [place]) se [place] para identificar problemas de [place]. Proporciona una estimacin de la grasa corporal basndose en el peso y la altura. Su mdico puede ayudarle a Radiation protection practitioner [place] y a Scientist, forensic o Theatre manager un peso saludable. Haga ejercicio con regularidad Haga ejercicio con regularidad. Esta es una de las prcticas ms importantes que puede hacer por su salud. La mayora de los adultos deben seguir estas pautas:  Optometrist, al menos, 185mnutos de actividad fsica por semana. El ejercicio debe aumentar la frecuencia cardaca y hNature conservation officertranspirar (ejercicio de intensidad moderada).  Hacer ejercicios de fortalecimiento por lo mHalliburton Companypor semana. Agregue esto a su plan de ejercicio de intensidad moderada.  Pasar menos tiempo sentados. Incluso la actividad fsica ligera puede ser beneficiosa. Controle sus niveles de colesterol y lpidos en la sangre Comience a realizarse anlisis de lpidos y cResearch officer, trade unionen la sangre a los 20aos y luego reptalos cada 5aos. Hgase controlar los niveles de colesterol con mayor frecuencia si:  Sus niveles de lpidos y colesterol son altos.  Es mayor de 40aos.  Presenta un alto riesgo de padecer enfermedades cardacas. Qu debo saber sobre las pruebas de  deteccin del cncer? Segn su historia clnica y sus antecedentes familiares, es posible que deba realizarse pruebas de deteccin del cncer en diferentes edades. Esto puede incluir pruebas de deteccin de lo siguiente:  Cncer de mama.  Cncer de cuello uterino.  Cncer colorrectal.  Cncer de piel.  Cncer de pulmn. Qu debo saber sobre la enfermedad cardaca, la diabetes y la hipertensin arterial? Presin arterial y enfermedad cardaca  La hipertensin arterial causa enfermedades cardacas y aSerbiael riesgo de accidente cerebrovascular. Es ms probable que esto se manifieste en las personas que tienen lecturas de presin arterial alta, tienen ascendencia africana o tienen sobrepeso.  Hgase controlar la presin arterial: ? Cada 3 a 5 aos si tiene entre 18 y 389aos. ? Todos los aos si es mayor de 4[place] Diabetes Realcese exmenes de deteccin de la diabetes con regularidad. Este anlisis revisa el nivel de azcar en la sangre en aStronghurst Hgase las pruebas de deteccin:  Cada tresaos despus de los 428aosde edad si tiene un peso normal y un bajo riesgo de padecer diabetes.  Con ms frecuencia y a partir de uSt. Petersedad inferior si tiene sobrepeso o un alto riesgo de padecer diabetes. Qu debo saber sobre la prevencin de infecciones? Hepatitis B Si tiene un riesgo ms alto de contraer hepatitis B, debe someterse a un examen de deteccin de este virus. Hable con el mdico para averiguar si tiene riesgo de contraer la infeccin por hepatitis B. Hepatitis C Se recomienda el anlisis a:  THexion Specialty Chemicals1945 y 1965.  Todas las personas que tengan un riesgo de haber contrado hepatitis C. Enfermedades de transmisin sexual (ETS)  Hgase las  pruebas de deteccin de ITS, incluidas la gonorrea y la clamidia, si: ? Es sexualmente activa y es menor de 24aos. ? Es mayor de 24aos, y el mdico le informa que corre riesgo de tener este tipo de  infecciones. ? La actividad sexual ha cambiado desde que le hicieron la ltima prueba de deteccin y tiene un riesgo mayor de tener clamidia o gonorrea. Pregntele al mdico si usted tiene riesgo.  Pregntele al mdico si usted tiene un alto riesgo de contraer VIH. El mdico tambin puede recomendarle un medicamento recetado para ayudar a evitar la infeccin por el VIH. Si elige tomar medicamentos para prevenir el VIH, primero debe hacerse los anlisis de deteccin del VIH. Luego debe hacerse anlisis cada 3meses mientras est tomando los medicamentos. Embarazo  Si est por dejar de menstruar (fase premenopusica) y usted puede quedar embarazada, busque asesoramiento antes de quedar embarazada.  Tome de 400 a 800microgramos (mcg) de cido flico todos los das si queda embarazada.  Pida mtodos de control de la natalidad (anticonceptivos) si desea evitar un embarazo no deseado. Osteoporosis y menopausia La osteoporosis es una enfermedad en la que los huesos pierden los minerales y la fuerza por el avance de la edad. El resultado pueden ser fracturas en los huesos. Si tiene 65aos o ms, o si est en riesgo de sufrir osteoporosis y fracturas, pregunte a su mdico si debe:  Hacerse pruebas de deteccin de prdida sea.  Tomar un suplemento de calcio o de vitamina D para reducir el riesgo de fracturas.  Recibir terapia de reemplazo hormonal (TRH) para tratar los sntomas de la menopausia. Siga estas instrucciones en su casa: Estilo de vida  No consuma ningn producto que contenga nicotina o tabaco, como cigarrillos, cigarrillos electrnicos y tabaco de mascar. Si necesita ayuda para dejar de fumar, consulte al mdico.  No consuma drogas.  No comparta agujas.  Solicite ayuda a su mdico si necesita apoyo o informacin para abandonar las drogas. Consumo de alcohol  No beba alcohol si: ? Su mdico le indica no hacerlo. ? Est embarazada, puede estar embarazada o est tratando de quedar  embarazada.  Si bebe alcohol: ? Limite la cantidad que consume de 0 a 1 medida por da. ? Limite la ingesta si est amamantando.  Est atento a la cantidad de alcohol que hay en las bebidas que toma. En los [place], una medida equivale a una botella de cerveza de 12oz (355ml), un vaso de vino de 5oz (148ml) o un vaso de una bebida alcohlica de alta graduacin de 1oz (44ml). Instrucciones generales  Realcese los estudios de rutina de la salud, dentales y de la vista.  Mantngase al da con las vacunas.  Infrmele a su mdico si: ? Se siente deprimida con frecuencia. ? Alguna vez ha sido vctima de maltrato o no se siente segura en su casa. Resumen  Adoptar un estilo de vida saludable y recibir atencin preventiva son importantes para promover la salud y el bienestar.  Siga las instrucciones del mdico acerca de una dieta saludable, el ejercicio y la realizacin de pruebas o exmenes para detectar enfermedades.  Siga las instrucciones del mdico con respecto al control del colesterol y la presin arterial. Esta informacin no tiene como fin reemplazar el consejo del mdico. Asegrese de hacerle al mdico cualquier pregunta que tenga. Document Revised: 06/15/2018 Document Reviewed: 06/15/2018 Elsevier Patient Education  2020 Elsevier Inc.  

## 2019-11-21 NOTE — Progress Notes (Signed)
Alexandra Norris presents for post op. S/P TVH/BS on 10/17/19. Doing well. Denies pain, bowel or bladder dysfunction. Pathology reviewed with pt.  PE AF VSS Lungs clear Heart RRR Abd soft + BS GU cuff well healed uterus and cervix surgerical absent, non tender  A/P Post Op TVH/BS Return to nl ADL's. F/U in 1 yr or PRN

## 2020-01-09 ENCOUNTER — Ambulatory Visit: Payer: BC Managed Care – PPO | Admitting: Orthopaedic Surgery

## 2020-01-09 ENCOUNTER — Ambulatory Visit: Payer: Self-pay

## 2020-01-09 ENCOUNTER — Encounter: Payer: Self-pay | Admitting: Orthopaedic Surgery

## 2020-01-09 VITALS — Ht 62.0 in | Wt 241.4 lb

## 2020-01-09 DIAGNOSIS — M1711 Unilateral primary osteoarthritis, right knee: Secondary | ICD-10-CM | POA: Diagnosis not present

## 2020-01-09 MED ORDER — LIDOCAINE HCL 1 % IJ SOLN
2.0000 mL | INTRAMUSCULAR | Status: AC | PRN
  Administered 2020-01-09: 2 mL

## 2020-01-09 MED ORDER — BUPIVACAINE HCL 0.25 % IJ SOLN
2.0000 mL | INTRAMUSCULAR | Status: AC | PRN
  Administered 2020-01-09: 2 mL via INTRA_ARTICULAR

## 2020-01-09 MED ORDER — METHYLPREDNISOLONE ACETATE 40 MG/ML IJ SUSP
40.0000 mg | INTRAMUSCULAR | Status: AC | PRN
  Administered 2020-01-09: 40 mg via INTRA_ARTICULAR

## 2020-01-09 NOTE — Progress Notes (Signed)
Office Visit Note   Patient: Alexandra Norris           Date of Birth: Mar 20, 1976           MRN: [id number] Visit Date: 01/09/2020              Requested by: Charlott Rakes, MD [place],  [place] [address] PCP: Charlott Rakes, MD   Assessment & Plan: Visit Diagnoses:  1. Primary osteoarthritis of right knee     Plan: Impression is right knee patellofemoral arthritis.  We will inject the right knee with cortisone today.  She will follow-up with [place] as needed.  This was all discussed through a Spanish-speaking interpreter.  Follow-Up Instructions: Return if symptoms worsen or fail to improve.   Orders:  Orders Placed This Encounter  Procedures  . Large Joint Inj: R knee  . XR KNEE 3 VIEW RIGHT   No orders of the defined types were placed in this encounter.     Procedures: Large Joint Inj: R knee on 01/09/2020 8:57 AM Indications: pain Details: 22 G needle, anterolateral approach Medications: 2 mL lidocaine 1 %; 2 mL bupivacaine 0.25 %; 40 mg methylPREDNISolone acetate 40 MG/ML      Clinical Data: No additional findings.   Subjective: Chief Complaint  Patient presents with  . Right Knee - Pain    HPI patient is a pleasant 44 year old Spanish-speaking female who comes in today with an interpreter.  She is here with right knee pain for the past year which has progressively worsened.  No known injury.  The pain she has is to the medial patella.  She describes this as a constant mild ache worse with going from seated to standing positions as well as going up and down stairs and with kneeling.  No locking, catching or instability.  She does note occasional swelling at night.  She has tried ibuprofen with mild relief of symptoms.  No previous cortisone injection or surgical intervention.  Review of Systems as detailed in HPI.  All others reviewed and are negative.   Objective: Vital Signs: Ht 5\' 2"  (1.575 m)   Wt 241 lb 6.4 oz (109.5 kg)    LMP 09/20/2019 (Exact Date)   BMI 44.15 kg/m   Physical Exam well-developed well-nourished female in no acute distress.  Alert and oriented x3.  Ortho Exam examination of the right knee shows no effusion.  She does have moderate tenderness to the medial and lateral patella.  No joint line tenderness.  Range of motion 0 to 95 degrees.  She is stable to valgus varus stress.  She is neurovascular intact distally.  Specialty Comments:  No specialty comments available.  Imaging: XR KNEE 3 VIEW RIGHT  Result Date: 01/09/2020 X-rays demonstrate decreased joint space patellofemoral compartment.  Otherwise, no acute findings    PMFS History: Patient Active Problem List   Diagnosis Date Noted  . Post-operative state 10/17/2019  . S/P carpal tunnel release 08/30/2019  . Right carpal tunnel syndrome 07/11/2019  . Left carpal tunnel syndrome 07/11/2019  . Arthritis   . Anemia   . Tracheobronchitis 08/25/2017  . Seasonal allergies 10/08/2016  . Pre-diabetes 10/21/2015  . Bilateral chronic knee pain 09/25/2015  . GASTROESOPHAGEAL REFLUX DISEASE 08/14/2009  . DEPRESSION, SITUATIONAL, PROLONGED 02/05/2009  . UNSPECIFIED VISUAL LOSS 11/27/2008  . OBESITY 05/30/2007  . Moderate persistent asthma with acute exacerbation 12/06/2006   Past Medical History:  Diagnosis Date  . Anemia   . Arthritis   .  Asthma   . Carpal tunnel syndrome   . Dyspnea   . Endometriosis   . History of blood transfusion 1999   w/vaginal delivery  . History of bronchitis    "I've stayed in hospital 2 X w/bronchitis"  . Migraine   . Pneumonia   . PONV (postoperative nausea and vomiting)   . Pre-diabetes 10/03/2019   A1C 6.4    Family History  Problem Relation Age of Onset  . Diabetes Mother   . Osteoarthritis Mother   . Hypertension Mother   . Hyperlipidemia Mother   . Arthritis Mother     Past Surgical History:  Procedure Laterality Date  . ABDOMINAL HYSTERECTOMY  10/17/2019  . APPENDECTOMY    .  CARPAL TUNNEL RELEASE Right 07/19/2019   Procedure: RIGHT CARPAL TUNNEL RELEASE;  Surgeon: Leandrew Koyanagi, MD;  Location: [facility];  Service: Orthopedics;  Laterality: Right;  . CARPAL TUNNEL RELEASE Left 09/13/2019   Procedure: LEFT CARPAL TUNNEL RELEASE;  Surgeon: Leandrew Koyanagi, MD;  Location: [facility];  Service: Orthopedics;  Laterality: Left;  Bier block  . CESAREAN SECTION  1997; 2008  . CHOLECYSTECTOMY  2011  . TONSILLECTOMY    . tonsilletomy    . TUBAL LIGATION  04/2007  . VAGINAL HYSTERECTOMY Bilateral 10/17/2019   Procedure: HYSTERECTOMY VAGINAL WITH SALPINGECTOMY;  Surgeon: Chancy Milroy, MD;  Location: [facility];  Service: Gynecology;  Laterality: Bilateral;   Social History   Occupational History  . Not on file  Tobacco Use  . Smoking status: Never Smoker  . Smokeless tobacco: Never Used  Vaping Use  . Vaping Use: Never used  Substance and Sexual Activity  . Alcohol use: No  . Drug use: No  . Sexual activity: Yes    Birth control/protection: Surgical

## 2020-01-10 LAB — HM DIABETES EYE EXAM

## 2020-01-16 ENCOUNTER — Ambulatory Visit: Payer: BC Managed Care – PPO | Admitting: Orthopaedic Surgery

## 2020-01-16 ENCOUNTER — Ambulatory Visit: Payer: Self-pay

## 2020-01-16 DIAGNOSIS — M542 Cervicalgia: Secondary | ICD-10-CM | POA: Diagnosis not present

## 2020-01-16 MED ORDER — PREDNISONE 10 MG (21) PO TBPK
ORAL_TABLET | ORAL | 0 refills | Status: DC
Start: 2020-01-16 — End: 2020-04-03

## 2020-01-16 MED ORDER — METHOCARBAMOL 500 MG PO TABS: 500.0000 mg | ORAL_TABLET | Freq: Two times a day (BID) | ORAL | 0 refills | Status: DC | PRN

## 2020-01-16 NOTE — Progress Notes (Signed)
Office Visit Note   Patient: Alexandra Norris           Date of Birth: 10/19/1975           MRN: [id number] Visit Date: 01/16/2020              Requested by: Charlott Rakes, MD [place],  [place] [address] PCP: Charlott Rakes, MD   Assessment & Plan: Visit Diagnoses:  1. Neck pain     Plan: impression is cervicalgia and radiculopathy.  We will start the patient with Sterapred taper and muscle relaxer.  We have also sent in a referral for formal physical therapy.  She will follow up with [place] as needed.  Follow-Up Instructions: Return if symptoms worsen or fail to improve.   Orders:  Orders Placed This Encounter  Procedures   XR Cervical Spine 2 or 3 views   Ambulatory referral to Physical Therapy   Meds ordered this encounter  Medications   predniSONE (STERAPRED UNI-PAK 21 TAB) 10 MG (21) TBPK tablet    Sig: Take as directed    Dispense:  21 tablet    Refill:  0   methocarbamol (ROBAXIN) 500 MG tablet    Sig: Take 1 tablet (500 mg total) by mouth 2 (two) times daily as needed.    Dispense:  20 tablet    Refill:  0      Procedures: No procedures performed   Clinical Data: No additional findings.   Subjective: Chief Complaint  Patient presents with   Neck - Pain    HPI patient is a pleasant 44 year old Spanish-speaking female who comes in today with interpreter.  She is here with neck pain for the past year.  No known injury or change in activity.  The pain she has is to the entire posterior neck and radiates into the parascapular region on both sides.  The movement of the neck seems to aggravate her symptoms.  She also has increased pain when she is lying in a supine position.  She does note occasional numbness to the upper arm.  She has tried Tylenol without significant relief of symptoms.  She is not previously had physical therapy or injections to the neck.  Review of Systems as detailed in HPI.  All others reviewed and are  negative.   Objective: Vital Signs: LMP 09/20/2019 (Exact Date)   Physical Exam well-developed well-nourished female no acute distress.  Alert oriented x3.  Ortho Exam examination of her cervical spine reveals moderate tenderness to C5-7.  She has moderate paraspinous tenderness as well to both sides.  Limited range of motion of the neck secondary to pain and stiffness.  No focal weakness.  She is neurovascular intact distally.  Specialty Comments:  No specialty comments available.  Imaging: XR Cervical Spine 2 or 3 views  Result Date: 01/16/2020 Straightening of the cervical spine.  Marked degenerative changes see 4 5 and C5-6 with anterior osteophytes.    PMFS History: Patient Active Problem List   Diagnosis Date Noted   Post-operative state 10/17/2019   S/P carpal tunnel release 08/30/2019   Right carpal tunnel syndrome 07/11/2019   Left carpal tunnel syndrome 07/11/2019   Arthritis    Anemia    Tracheobronchitis 08/25/2017   Seasonal allergies 10/08/2016   Pre-diabetes 10/21/2015   Bilateral chronic knee pain 09/25/2015   GASTROESOPHAGEAL REFLUX DISEASE 08/14/2009   DEPRESSION, SITUATIONAL, PROLONGED 02/05/2009   UNSPECIFIED VISUAL LOSS 11/27/2008   OBESITY 05/30/2007   Moderate persistent  asthma with acute exacerbation 12/06/2006   Past Medical History:  Diagnosis Date   Anemia    Arthritis    Asthma    Carpal tunnel syndrome    Dyspnea    Endometriosis    History of blood transfusion 1999   w/vaginal delivery   History of bronchitis    "I've stayed in hospital 2 X w/bronchitis"   Migraine    Pneumonia    PONV (postoperative nausea and vomiting)    Pre-diabetes 10/03/2019   A1C 6.4    Family History  Problem Relation Age of Onset   Diabetes Mother    Osteoarthritis Mother    Hypertension Mother    Hyperlipidemia Mother    Arthritis Mother     Past Surgical History:  Procedure Laterality Date   ABDOMINAL  HYSTERECTOMY  10/17/2019   APPENDECTOMY     CARPAL TUNNEL RELEASE Right 07/19/2019   Procedure: RIGHT CARPAL TUNNEL RELEASE;  Surgeon: Leandrew Koyanagi, MD;  Location: [facility];  Service: Orthopedics;  Laterality: Right;   CARPAL TUNNEL RELEASE Left 09/13/2019   Procedure: LEFT CARPAL TUNNEL RELEASE;  Surgeon: Leandrew Koyanagi, MD;  Location: [facility];  Service: Orthopedics;  Laterality: Left;  Bier block   CESAREAN SECTION  1997; 2008   CHOLECYSTECTOMY  2011   TONSILLECTOMY     tonsilletomy     TUBAL LIGATION  04/2007   VAGINAL HYSTERECTOMY Bilateral 10/17/2019   Procedure: HYSTERECTOMY VAGINAL WITH SALPINGECTOMY;  Surgeon: Chancy Milroy, MD;  Location: [facility];  Service: Gynecology;  Laterality: Bilateral;   Social History   Occupational History   Not on file  Tobacco Use   Smoking status: Never Smoker   Smokeless tobacco: Never Used  Vaping Use   Vaping Use: Never used  Substance and Sexual Activity   Alcohol use: No   Drug use: No   Sexual activity: Yes    Birth control/protection: Surgical

## 2020-01-26 ENCOUNTER — Ambulatory Visit: Payer: BC Managed Care – PPO | Attending: Physician Assistant | Admitting: Physical Therapy

## 2020-01-26 ENCOUNTER — Encounter: Payer: Self-pay | Admitting: Physical Therapy

## 2020-01-26 ENCOUNTER — Other Ambulatory Visit: Payer: Self-pay

## 2020-01-26 DIAGNOSIS — R208 Other disturbances of skin sensation: Secondary | ICD-10-CM

## 2020-01-26 DIAGNOSIS — M6281 Muscle weakness (generalized): Secondary | ICD-10-CM | POA: Diagnosis present

## 2020-01-26 DIAGNOSIS — R209 Unspecified disturbances of skin sensation: Secondary | ICD-10-CM | POA: Diagnosis present

## 2020-01-26 DIAGNOSIS — M542 Cervicalgia: Secondary | ICD-10-CM | POA: Diagnosis not present

## 2020-01-26 NOTE — Therapy (Signed)
[place], [place], [address] Phone: [phone]   Fax:  [phone]  Physical Therapy Evaluation  Patient Details  Name: Alexandra Norris MRN: [id number] Date of Birth: 09-28-1975 Referring Provider (PT): Dr. Erlinda Hong    Encounter Date: 01/26/2020   PT End of Session - 01/26/20 0849    Visit Number 1    Number of Visits 12    Date for PT Re-Evaluation 03/08/20    Authorization Type BCBS    PT Start Time 0832    PT Stop Time 0935    PT Time Calculation (min) 63 min    Activity Tolerance Patient tolerated treatment well    Behavior During Therapy [facility] for tasks assessed/performed           Past Medical History:  Diagnosis Date   Anemia    Arthritis    Asthma    Carpal tunnel syndrome    Dyspnea    Endometriosis    History of blood transfusion 1999   w/vaginal delivery   History of bronchitis    "I've stayed in hospital 2 X w/bronchitis"   Migraine    Pneumonia    PONV (postoperative nausea and vomiting)    Pre-diabetes 10/03/2019   A1C 6.4    Past Surgical History:  Procedure Laterality Date   ABDOMINAL HYSTERECTOMY  10/17/2019   APPENDECTOMY     CARPAL TUNNEL RELEASE Right 07/19/2019   Procedure: RIGHT CARPAL TUNNEL RELEASE;  Surgeon: Leandrew Koyanagi, MD;  Location: [facility];  Service: Orthopedics;  Laterality: Right;   CARPAL TUNNEL RELEASE Left 09/13/2019   Procedure: LEFT CARPAL TUNNEL RELEASE;  Surgeon: Leandrew Koyanagi, MD;  Location: [facility];  Service: Orthopedics;  Laterality: Left;  Bier block   CESAREAN SECTION  1997; 2008   CHOLECYSTECTOMY  2011   TONSILLECTOMY     tonsilletomy     TUBAL LIGATION  04/2007   VAGINAL HYSTERECTOMY Bilateral 10/17/2019   Procedure: HYSTERECTOMY VAGINAL WITH SALPINGECTOMY;  Surgeon: Chancy Milroy, MD;  Location: [facility];  Service: Gynecology;  Laterality: Bilateral;    There were no  vitals filed for this visit.    Subjective Assessment - 01/26/20 0840    Subjective Patient here for PT for neck pain and arms.  Pain increasing and spreading into the back of my head.  I even get dizzy and have pain in my eyes.  It has been about 6 mos but headaches about 6 weeks.  The headache does not go away.  Even to fall asleep at night is hard.There are many things I cannot do.  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    Pertinent History carpal tunnel syndrome with surgery, stress    Limitations House hold activities;Standing    Diagnostic tests XR done not known to patient at this time    Patient Stated Goals To try to have a little less pain .    Currently in Pain? Yes    Pain Score 6     Pain Location Neck    Pain Orientation Left;Posterior    Pain Descriptors / Indicators Tingling;Aching;Sore    Pain Type Chronic pain    Pain Radiating Towards LUE to elbow    Pain Onset More than a month ago    Pain Frequency Constant    Aggravating Factors  working, looking, down, driving    Pain  Relieving Factors tylenol, laying flat, taking hair down, extending neck    Multiple Pain Sites Yes    Pain Score 7    Pain Location Head    Pain Type Acute pain    Pain Relieving Factors laying down              [facility] PT Assessment - 01/26/20 0001      Assessment   Medical Diagnosis neck pain     Referring Provider (PT) Dr. Erlinda Hong     Onset Date/Surgical Date --   6 mos    Hand Dominance Right    Next MD Visit 6 weeks if no better     Prior Therapy No       Precautions   Precautions None      Restrictions   Weight Bearing Restrictions No      Balance Screen   Has the patient fallen in the past 6 months No    Has the patient had a decrease in activity level because of a fear of falling?  Yes    Is the patient reluctant to leave their home because of a fear of falling?  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 , clean       Cognition   Overall Cognitive Status Within Functional Limits for tasks assessed      Observation/Other Assessments   Focus on Therapeutic Outcomes (FOTO)  70%      Sensation   Light Touch Impaired by gross assessment    Additional Comments hands numb at times       Posture/Postural Control   Posture/Postural Control Postural limitations    Postural Limitations Rounded Shoulders;Forward head    Posture Comments guarded       AROM   Overall AROM Comments Shoulder WFL but stiff end range in flexion     Cervical Flexion 25   pain    Cervical Extension 45   pain    Cervical - Right Side Bend 22   pain L    Cervical - Left Side Bend 40   Pain r   Cervical - Right Rotation 45   pain    Cervical - Left Rotation 50   pain      Strength   Right Shoulder Flexion 4-/5    Right Shoulder ABduction 4-/5    Left Shoulder Flexion 3+/5    Left Shoulder ABduction 3+/5    Right/Left Elbow --   WFL    Right Hand Grip (lbs) 2    Left Hand Grip (lbs) 5      Palpation   Palpation comment pain post cervicals and laterally into scalenes, upper traps      Special Tests   Other special tests cervical distraction reduced neck pain but not UE tingling              Objective measurements completed on examination: See above findings.      PT Education - 01/26/20 8299    Education Details PT,  POC, HEP, cervical radiculopathy, sensory changes  Person(s) Educated Patient    Methods Explanation;Verbal cues;Handout    Comprehension Verbalized understanding;Returned demonstration;Need further instruction               PT Long  Term Goals - 01/26/20 0848      PT LONG TERM GOAL #1   Title Pt will be I with HEP for cervical ROM and strength.    Baseline unknown    Time 6    Period Weeks    Status New    Target Date 03/08/20      PT LONG TERM GOAL #2   Title Pt will report min to no hand, arm symptoms (tingling) with ADLs    Baseline moderate most of the time    Time 6    Period Weeks    Status New    Target Date 03/08/20      PT LONG TERM GOAL #3   Title Pt will improve FOTO score to less than 45% to demo improved functional mobility    Baseline 70%    Time 6    Period Weeks    Status New    Target Date 03/08/20      PT LONG TERM GOAL #4   Title Pt will increase grip strength to at least 10 lbs on each hand    Time 6    Period Weeks    Status New    Target Date 03/08/20      PT LONG TERM GOAL #5   Title Pt will be able to wash, clean and meal prep at home without limitation of neck and arm pain    Time 6    Period Weeks    Status New    Target Date 03/08/20      PT LONG TERM GOAL #6   Title Pt will report headaches as more intermittent and no more than 4/10 most of the time    Time 6    Period Weeks    Status New    Target Date 03/08/20                  Plan - 01/26/20 5465    Clinical Impression Statement This patient presents for mod complexity with neck and arm pain consistent with cervical radiculopathy. There is likely some continuation of nerve compression in L UE from her CTS procedure 4-6 mos ago.  She dpoes notice less arm symptoms when arms are supported.  She is very weak in grip, shoulders and shows poor posture, decreased lordosis in cervical spine.  She should expect improvement in neck pain with PT and self care strategies, but residual intermittent numbness may last longer than PT episode.    Personal Factors and Comorbidities Comorbidity 3+;Social Background;Profession;Past/Current Experience    Comorbidities carpal tunnel syndome Rt 6 mos ago, Lt. 4 mos ago.  Hysterectomy 10/2019    Examination-Activity Limitations Reach Overhead;Stand;Bed Mobility;Carry;Locomotion Level;Lift;Caring for Others;Hygiene/Grooming    Examination-Participation Restrictions Church;Interpersonal Relationship;Occupation;Cleaning;Laundry;Yard Work;Community Activity;Meal Prep    Stability/Clinical Decision Making Evolving/Moderate complexity    Clinical Decision Making Moderate    Rehab Potential Excellent    PT Frequency 2x / week    PT Duration 6 weeks    PT Treatment/Interventions ADLs/Self Care Home Management;Electrical Stimulation;Moist Heat;Traction;Therapeutic activities;Functional mobility training;Therapeutic exercise;Patient/family education;Dry needling;Passive range of motion;Manual techniques;Neuromuscular re-education    PT Next Visit Plan check HEP, manual to cervical spine, consider traction    PT Home Exercise Plan [email or web address]: chin tucj, seated scap retraction, upper trap, extnsion with  towel    Consulted and Agree with Plan of Care Patient           Patient will benefit from skilled therapeutic intervention in order to improve the following deficits and impairments:  Impaired flexibility, Increased fascial restricitons, Postural dysfunction, Pain, Impaired UE functional use, Decreased strength, Decreased range of motion, Obesity, Impaired sensation, Decreased mobility  Visit Diagnosis: Cervicalgia  Muscle weakness (generalized)  Other disturbances of skin sensation     Problem List Patient Active Problem List   Diagnosis Date Noted   Post-operative state 10/17/2019   S/P carpal tunnel release 08/30/2019   Right carpal tunnel syndrome 07/11/2019   Left carpal tunnel syndrome 07/11/2019   Arthritis    Anemia    Tracheobronchitis 08/25/2017   Seasonal allergies 10/08/2016   Pre-diabetes 10/21/2015   Bilateral chronic knee pain 09/25/2015   GASTROESOPHAGEAL REFLUX DISEASE 08/14/2009   DEPRESSION, SITUATIONAL, PROLONGED 02/05/2009     UNSPECIFIED VISUAL LOSS 11/27/2008   OBESITY 05/30/2007   Moderate persistent asthma with acute exacerbation 12/06/2006    Alexandra Norris 01/26/2020, 9:57 AM  [facility] [address] [place], [place], [address] Phone: [phone]   Fax:  [phone]  Name: Alexandra Norris MRN: [id number] Date of Birth: 05-12-1976   Raeford Razor, PT 01/26/20 9:57 AM Phone: [phone] Fax: [phone]

## 2020-01-26 NOTE — Patient Instructions (Signed)
Access Code: [email or web address]: [email or web address]: 08/20/2021Prepared by: Anderson Malta PaaExercises  Supine Cervical Retraction with Towel - 2 x daily - 7 x weekly - 1 sets - 10 reps - 5 hold  Seated Scapular Retraction - 2 x daily - 7 x weekly - 2 sets - 10 reps - 5 hold  Seated Gentle Upper Trapezius Stretch - 2 x daily - 7 x weekly - 1 sets - 3 reps - 5 hold  Cervical Extension AROM with Strap - 2 x daily - 7 x weekly - 1 sets - 10 reps - 5 hold

## 2020-02-01 ENCOUNTER — Ambulatory Visit: Payer: BC Managed Care – PPO

## 2020-02-01 ENCOUNTER — Other Ambulatory Visit: Payer: Self-pay

## 2020-02-01 DIAGNOSIS — M6281 Muscle weakness (generalized): Secondary | ICD-10-CM

## 2020-02-01 DIAGNOSIS — R208 Other disturbances of skin sensation: Secondary | ICD-10-CM

## 2020-02-01 DIAGNOSIS — M542 Cervicalgia: Secondary | ICD-10-CM | POA: Diagnosis not present

## 2020-02-02 NOTE — Therapy (Signed)
[place], [place], [address] Phone: [phone]   Fax:  [phone]  Physical Therapy Treatment  Patient Details  Name: Alexandra Norris MRN: [id number] Date of Birth: [birth year] Referring Provider (PT): Dr. Erlinda Hong    Encounter Date: 02/01/2020   PT End of Session - 02/02/20 0755    Visit Number 2    Number of Visits 12    Date for PT Re-Evaluation 03/08/20    Authorization Type BCBS    PT Start Time 1836    PT Stop Time 1921    PT Time Calculation (min) 45 min    Activity Tolerance Patient tolerated treatment well    Behavior During Therapy [facility] for tasks assessed/performed           Past Medical History:  Diagnosis Date  . Anemia   . Arthritis   . Asthma   . Carpal tunnel syndrome   . Dyspnea   . Endometriosis   . History of blood transfusion 1999   w/vaginal delivery  . History of bronchitis    "I've stayed in hospital 2 X w/bronchitis"  . Migraine   . Pneumonia   . PONV (postoperative nausea and vomiting)   . Pre-diabetes 10/03/2019   A1C 6.4    Past Surgical History:  Procedure Laterality Date  . ABDOMINAL HYSTERECTOMY  10/17/2019  . APPENDECTOMY    . CARPAL TUNNEL RELEASE Right 07/19/2019   Procedure: RIGHT CARPAL TUNNEL RELEASE;  Surgeon: Leandrew Koyanagi, MD;  Location: [facility];  Service: Orthopedics;  Laterality: Right;  . CARPAL TUNNEL RELEASE Left 09/13/2019   Procedure: LEFT CARPAL TUNNEL RELEASE;  Surgeon: Leandrew Koyanagi, MD;  Location: [facility];  Service: Orthopedics;  Laterality: Left;  Bier block  . CESAREAN SECTION  1997; 2008  . CHOLECYSTECTOMY  2011  . TONSILLECTOMY    . tonsilletomy    . TUBAL LIGATION  04/2007  . VAGINAL HYSTERECTOMY Bilateral 10/17/2019   Procedure: HYSTERECTOMY VAGINAL WITH SALPINGECTOMY;  Surgeon: Chancy Milroy, MD;  Location: [facility];  Service: Gynecology;  Laterality: Bilateral;    There were no  vitals filed for this visit.   Subjective Assessment - 02/01/20 1852    Subjective Pt reports her neck and upper shoulder pain is still the same, but she has not been experiencing headaches.    Limitations House hold activities;Standing    Patient Stated Goals To try to have a little less pain .    Currently in Pain? Yes    Pain Score 6     Pain Location Neck   Upper traps   Pain Orientation Right;Left;Posterior    Pain Descriptors / Indicators Aching;Tingling;Sore    Pain Type Chronic pain    Pain Onset More than a month ago    Pain Frequency Constant    Multiple Pain Sites Yes    Pain Score 0    Pain Location Head    Pain Orientation Posterior    Pain Descriptors / Indicators Aching    Pain Type Chronic pain                             [facility] Adult PT Treatment/Exercise - 02/02/20 0001      Neck Exercises: Standing   Other Standing Exercises Thoracic rotation reach through 5x; 10 sec    Other Standing Exercises Scapular retractions 10x2, GTB      Neck Exercises:  Seated   Neck Retraction 10 reps;3 secs    Neck Retraction Limitations scapular retraction x 10       Neck Exercises: Stretches   Upper Trapezius Stretch 2 reps;30 seconds    Corner Stretch 2 reps;20 seconds   Doorway, pec                 PT Education - 02/02/20 0747    Education Details HEP, use of tennis ball and massage cane for trigger point massge    Person(s) Educated Patient    Methods Explanation;Demonstration;Verbal cues;Tactile cues;Handout    Comprehension Verbalized understanding;Returned demonstration;Verbal cues required;Tactile cues required;Need further instruction               PT Long Term Goals - 01/26/20 0848      PT LONG TERM GOAL #1   Title Pt will be I with HEP for cervical ROM and strength.    Baseline unknown    Time 6    Period Weeks    Status New    Target Date 03/08/20      PT LONG TERM GOAL #2   Title Pt will report min to no hand, arm  symptoms (tingling) with ADLs    Baseline moderate most of the time    Time 6    Period Weeks    Status New    Target Date 03/08/20      PT LONG TERM GOAL #3   Title Pt will improve FOTO score to less than 45% to demo improved functional mobility    Baseline 70%    Time 6    Period Weeks    Status New    Target Date 03/08/20      PT LONG TERM GOAL #4   Title Pt will increase grip strength to at least 10 lbs on each hand    Time 6    Period Weeks    Status New    Target Date 03/08/20      PT LONG TERM GOAL #5   Title Pt will be able to wash, clean and meal prep at home without limitation of neck and arm pain    Time 6    Period Weeks    Status New    Target Date 03/08/20      PT LONG TERM GOAL #6   Title Pt will report headaches as more intermittent and no more than 4/10 most of the time    Time 6    Period Weeks    Status New    Target Date 03/08/20                 Plan - 02/02/20 0748    Clinical Impression Statement PT was provided to address postural and work related stressess to the crvical and upper traps by ROM/stretching/strenthening exs as well as education of methods to address muscle tightness/trigger points per tennis ball massage and massage cane. Pt completed the es correctly and felt the tennis ball and the massge cane would be helpful. Info re: obtaining a massge cane was requested and provided.    Personal Factors and Comorbidities Comorbidity 3+;Social Background;Profession;Past/Current Experience    Comorbidities carpal tunnel syndome Rt 6 mos ago, Lt. 4 mos ago. Hysterectomy 10/2019    Examination-Activity Limitations Reach Overhead;Stand;Bed Mobility;Carry;Locomotion Level;Lift;Caring for Others;Hygiene/Grooming    Examination-Participation Restrictions Church;Interpersonal Relationship;Occupation;Cleaning;Laundry;Yard Work;Community Activity;Meal Prep    Stability/Clinical Decision Making Evolving/Moderate complexity    Clinical Decision Making  Moderate    Rehab  Potential Excellent    PT Frequency 2x / week    PT Duration 6 weeks    PT Treatment/Interventions ADLs/Self Care Home Management;Electrical Stimulation;Moist Heat;Traction;Therapeutic activities;Functional mobility training;Therapeutic exercise;Patient/family education;Dry needling;Passive range of motion;Manual techniques;Neuromuscular re-education    PT Next Visit Plan Assess response to HEP, manual to cervical and upper trap, consider traction    PT Home Exercise Plan [email or web address]: chin tuck, seated scap retraction, upper trap, extension with towel, standing thoracic rotation reach through; scapular retaction c GTB           Patient will benefit from skilled therapeutic intervention in order to improve the following deficits and impairments:  Impaired flexibility, Increased fascial restricitons, Postural dysfunction, Pain, Impaired UE functional use, Decreased strength, Decreased range of motion, Obesity, Impaired sensation, Decreased mobility  Visit Diagnosis: Cervicalgia  Muscle weakness (generalized)  Other disturbances of skin sensation     Problem List Patient Active Problem List   Diagnosis Date Noted  . Post-operative state 10/17/2019  . S/P carpal tunnel release 08/30/2019  . Right carpal tunnel syndrome 07/11/2019  . Left carpal tunnel syndrome 07/11/2019  . Arthritis   . Anemia   . Tracheobronchitis 08/25/2017  . Seasonal allergies 10/08/2016  . Pre-diabetes 10/21/2015  . Bilateral chronic knee pain 09/25/2015  . GASTROESOPHAGEAL REFLUX DISEASE 08/14/2009  . DEPRESSION, SITUATIONAL, PROLONGED 02/05/2009  . UNSPECIFIED VISUAL LOSS 11/27/2008  . OBESITY 05/30/2007  . Moderate persistent asthma with acute exacerbation 12/06/2006    Gar Ponto MS, PT 02/02/20 7:58 AM  [facility] [address] [place], [place], [address] Phone: [phone]   Fax:  [phone]  Name: Alexandra Norris MRN: [id number] Date of Birth: 03-Nov-1975

## 2020-02-02 NOTE — Patient Instructions (Signed)
   Completed in standing

## 2020-02-14 ENCOUNTER — Telehealth: Payer: Self-pay | Admitting: Physical Therapy

## 2020-02-14 ENCOUNTER — Ambulatory Visit: Payer: BC Managed Care – PPO | Attending: Physician Assistant | Admitting: Physical Therapy

## 2020-02-14 DIAGNOSIS — M542 Cervicalgia: Secondary | ICD-10-CM | POA: Insufficient documentation

## 2020-02-14 DIAGNOSIS — M6281 Muscle weakness (generalized): Secondary | ICD-10-CM | POA: Insufficient documentation

## 2020-02-14 DIAGNOSIS — R209 Unspecified disturbances of skin sensation: Secondary | ICD-10-CM | POA: Insufficient documentation

## 2020-02-14 NOTE — Telephone Encounter (Signed)
LVM in regards to pt's missed 5:00pm physical therapy appointment.  Reminded pt of next appointment. Asked pt to call if she needs to reschedule, cancel, or no longer interested in therapy.  Angla Delahunt April Gordy Levan, PT, DPT

## 2020-02-17 ENCOUNTER — Ambulatory Visit: Payer: BC Managed Care – PPO

## 2020-02-17 ENCOUNTER — Other Ambulatory Visit: Payer: Self-pay

## 2020-02-17 DIAGNOSIS — R208 Other disturbances of skin sensation: Secondary | ICD-10-CM

## 2020-02-17 DIAGNOSIS — R209 Unspecified disturbances of skin sensation: Secondary | ICD-10-CM | POA: Diagnosis present

## 2020-02-17 DIAGNOSIS — M6281 Muscle weakness (generalized): Secondary | ICD-10-CM

## 2020-02-17 DIAGNOSIS — M542 Cervicalgia: Secondary | ICD-10-CM | POA: Diagnosis present

## 2020-02-17 NOTE — Therapy (Signed)
[place], [place], [address] Phone: [phone]   Fax:  [phone]  Physical Therapy Treatment  Patient Details  Name: Alexandra Norris MRN: [id number] Date of Birth: 03-May-1976 Referring Provider (PT): Dr. Erlinda Hong    Encounter Date: 02/17/2020   PT End of Session - 02/17/20 1259    Visit Number 2    Number of Visits 12    Date for PT Re-Evaluation 03/08/20    Authorization Type BCBS    PT Start Time [phone]    PT Stop Time 1032    PT Time Calculation (min) 46 min    Activity Tolerance Patient tolerated treatment well    Behavior During Therapy [facility] for tasks assessed/performed           Past Medical History:  Diagnosis Date  . Anemia   . Arthritis   . Asthma   . Carpal tunnel syndrome   . Dyspnea   . Endometriosis   . History of blood transfusion 1999   w/vaginal delivery  . History of bronchitis    "I've stayed in hospital 2 X w/bronchitis"  . Migraine   . Pneumonia   . PONV (postoperative nausea and vomiting)   . Pre-diabetes 10/03/2019   A1C 6.4    Past Surgical History:  Procedure Laterality Date  . ABDOMINAL HYSTERECTOMY  10/17/2019  . APPENDECTOMY    . CARPAL TUNNEL RELEASE Right 07/19/2019   Procedure: RIGHT CARPAL TUNNEL RELEASE;  Surgeon: Leandrew Koyanagi, MD;  Location: [facility];  Service: Orthopedics;  Laterality: Right;  . CARPAL TUNNEL RELEASE Left 09/13/2019   Procedure: LEFT CARPAL TUNNEL RELEASE;  Surgeon: Leandrew Koyanagi, MD;  Location: [place];  Service: Orthopedics;  Laterality: Left;  Bier block  . CESAREAN SECTION  1997; 2008  . CHOLECYSTECTOMY  2011  . TONSILLECTOMY    . tonsilletomy    . TUBAL LIGATION  04/2007  . VAGINAL HYSTERECTOMY Bilateral 10/17/2019   Procedure: HYSTERECTOMY VAGINAL WITH SALPINGECTOMY;  Surgeon: Chancy Milroy, MD;  Location: [facility];  Service: Gynecology;  Laterality: Bilateral;    There were no  vitals filed for this visit.   Subjective Assessment - 02/17/20 1252    Subjective Pt reports the exs, tennis balls for massage and massage cane have all been helpful to relieve her neck, upper back pain and HAs, but the relief is only temporary    Pertinent History carpal tunnel syndrome with surgery, stress    Limitations House hold activities;Standing    Patient Stated Goals To try to have a little less pain .    Currently in Pain? Yes    Pain Score 8     Pain Location Neck   upper back   Pain Orientation Right;Left;Proximal;Upper;Lower    Pain Descriptors / Indicators Aching    Pain Type Chronic pain    Pain Onset More than a month ago    Pain Frequency Constant    Aggravating Factors  Work, driivng    Pain Relieving Factors tylenol, laying flat, taking hair down, extending neck    Effect of Pain on Daily Activities I'm not able to work somedays                             [facility] Adult PT Treatment/Exercise - 02/17/20 0001      Exercises   Exercises Neck;Shoulder      Neck Exercises: Theraband  Scapula Retraction 10 reps;Green    Scapula Retraction Limitations 3 sets    Shoulder Extension 10 reps;Green    Shoulder Extension Limitations 3 sets      Neck Exercises: Seated   Neck Retraction 10 reps;3 secs    Cervical Rotation Right;Left;10 reps    Cervical Rotation Limitations 5 sec; gentle overpressure by pt.      Shoulder Exercises: Standing   Extension Strengthening;Both;10 reps    Theraband Level (Shoulder Extension) Level 3 (Green)    Extension Limitations 3 sets    Retraction Strengthening;Both;10 reps      Manual Therapy   Manual Therapy Soft tissue mobilization;Manual Traction    Soft tissue mobilization STW to the cervical and upper thoracic paraspinals, sub-occipital and upper traps. Presentation was primarily tight muscles    Manual Traction Axial manual cervical traction was provided with grade 3 oscillations       Neck Exercises:  Stretches   Upper Trapezius Stretch 3 reps;20 seconds                       PT Long Term Goals - 01/26/20 0848      PT LONG TERM GOAL #1   Title Pt will be I with HEP for cervical ROM and strength.    Baseline unknown    Time 6    Period Weeks    Status New    Target Date 03/08/20      PT LONG TERM GOAL #2   Title Pt will report min to no hand, arm symptoms (tingling) with ADLs    Baseline moderate most of the time    Time 6    Period Weeks    Status New    Target Date 03/08/20      PT LONG TERM GOAL #3   Title Pt will improve FOTO score to less than 45% to demo improved functional mobility    Baseline 70%    Time 6    Period Weeks    Status New    Target Date 03/08/20      PT LONG TERM GOAL #4   Title Pt will increase grip strength to at least 10 lbs on each hand    Time 6    Period Weeks    Status New    Target Date 03/08/20      PT LONG TERM GOAL #5   Title Pt will be able to wash, clean and meal prep at home without limitation of neck and arm pain    Time 6    Period Weeks    Status New    Target Date 03/08/20      PT LONG TERM GOAL #6   Title Pt will report headaches as more intermittent and no more than 4/10 most of the time    Time 6    Period Weeks    Status New    Target Date 03/08/20                 Plan - 02/17/20 1300    Clinical Impression Statement Interventions to assist with neck and upper back have been helpful in managing the pain temporarily, bur pt is not experincing an overall reduction in pain.  In addition to ther ex, PT today completed STW to the sub-occipital, cervical and thoracic paraspinals, and upper traps, as well as manual axial cervical traction. Pt is most TTP in the sup-occipital region and CT junction paraspinals. Following the PT session,  pt reported her HA was resolved and her neck/upper shoulder painwas decreased to 3-4/10.    Personal Factors and Comorbidities Comorbidity 3+;Social  Background;Profession;Past/Current Experience    Comorbidities carpal tunnel syndome Rt 6 mos ago, Lt. 4 mos ago. Hysterectomy 10/2019    Examination-Activity Limitations Reach Overhead;Stand;Bed Mobility;Carry;Locomotion Level;Lift;Caring for Others;Hygiene/Grooming    Examination-Participation Restrictions Church;Interpersonal Relationship;Occupation;Cleaning;Laundry;Yard Work;Community Activity;Meal Prep    Stability/Clinical Decision Making Evolving/Moderate complexity    Clinical Decision Making Moderate    Rehab Potential Excellent    PT Frequency 2x / week    PT Duration 6 weeks    PT Treatment/Interventions ADLs/Self Care Home Management;Electrical Stimulation;Moist Heat;Traction;Therapeutic activities;Functional mobility training;Therapeutic exercise;Patient/family education;Dry needling;Passive range of motion;Manual techniques;Neuromuscular re-education    PT Next Visit Plan Continue ther ex to address posture and manual therapy to address muscle tightness and pain.    PT Home Exercise Plan [id number]: chin tuck, seated scap retraction, upper trap, extension with towel, standing thoracic rotation reach through; scapular retaction c GTB    Consulted and Agree with Plan of Care Patient           Patient will benefit from skilled therapeutic intervention in order to improve the following deficits and impairments:  Impaired flexibility, Increased fascial restricitons, Postural dysfunction, Pain, Impaired UE functional use, Decreased strength, Decreased range of motion, Obesity, Impaired sensation, Decreased mobility  Visit Diagnosis: Cervicalgia  Muscle weakness (generalized)  Other disturbances of skin sensation     Problem List Patient Active Problem List   Diagnosis Date Noted  . Post-operative state 10/17/2019  . S/P carpal tunnel release 08/30/2019  . Right carpal tunnel syndrome 07/11/2019  . Left carpal tunnel syndrome 07/11/2019  . Arthritis   . Anemia   .  Tracheobronchitis 08/25/2017  . Seasonal allergies 10/08/2016  . Pre-diabetes 10/21/2015  . Bilateral chronic knee pain 09/25/2015  . GASTROESOPHAGEAL REFLUX DISEASE 08/14/2009  . DEPRESSION, SITUATIONAL, PROLONGED 02/05/2009  . UNSPECIFIED VISUAL LOSS 11/27/2008  . OBESITY 05/30/2007  . Moderate persistent asthma with acute exacerbation 12/06/2006    Gar Ponto MS, PT 02/17/20 1:20 PM  [facility] [address] [place], [place], [address] Phone: [phone]   Fax:  [phone]  Name: Alexandra Norris MRN: [id number] Date of Birth: Apr 11, 1976

## 2020-02-21 ENCOUNTER — Ambulatory Visit: Payer: BC Managed Care – PPO | Admitting: Physical Therapy

## 2020-02-23 ENCOUNTER — Ambulatory Visit: Payer: BC Managed Care – PPO

## 2020-03-06 ENCOUNTER — Ambulatory Visit: Payer: BC Managed Care – PPO | Admitting: Physical Therapy

## 2020-03-08 ENCOUNTER — Other Ambulatory Visit: Payer: Self-pay

## 2020-03-08 ENCOUNTER — Encounter: Payer: Self-pay | Admitting: Physical Therapy

## 2020-03-08 ENCOUNTER — Ambulatory Visit: Payer: BC Managed Care – PPO

## 2020-03-08 ENCOUNTER — Ambulatory Visit: Payer: BC Managed Care – PPO | Attending: Physician Assistant | Admitting: Physical Therapy

## 2020-03-08 DIAGNOSIS — R208 Other disturbances of skin sensation: Secondary | ICD-10-CM | POA: Insufficient documentation

## 2020-03-08 DIAGNOSIS — M6281 Muscle weakness (generalized): Secondary | ICD-10-CM | POA: Insufficient documentation

## 2020-03-08 DIAGNOSIS — M542 Cervicalgia: Secondary | ICD-10-CM | POA: Diagnosis not present

## 2020-03-08 NOTE — Therapy (Signed)
[place], [place], [address] Phone: [phone]   Fax:  [phone]  Physical Therapy Treatment  Patient Details  Name: Alexandra Norris MRN: [id number] Date of Birth: 08-08-1975 Referring Provider (PT): Dr. Erlinda Hong    Encounter Date: 03/08/2020   PT End of Session - 03/08/20 1022    Visit Number 4    Number of Visits 12    Date for PT Re-Evaluation 03/08/20    Authorization Type BCBS, [place] as completed at visit 4, repeat at 8th or 10th    PT Start Time 1015    PT Stop Time 1100    PT Time Calculation (min) 45 min           Past Medical History:  Diagnosis Date  . Anemia   . Arthritis   . Asthma   . Carpal tunnel syndrome   . Dyspnea   . Endometriosis   . History of blood transfusion 1999   w/vaginal delivery  . History of bronchitis    "I've stayed in hospital 2 X w/bronchitis"  . Migraine   . Pneumonia   . PONV (postoperative nausea and vomiting)   . Pre-diabetes 10/03/2019   A1C 6.4    Past Surgical History:  Procedure Laterality Date  . ABDOMINAL HYSTERECTOMY  10/17/2019  . APPENDECTOMY    . CARPAL TUNNEL RELEASE Right 07/19/2019   Procedure: RIGHT CARPAL TUNNEL RELEASE;  Surgeon: Leandrew Koyanagi, MD;  Location: [facility];  Service: Orthopedics;  Laterality: Right;  . CARPAL TUNNEL RELEASE Left 09/13/2019   Procedure: LEFT CARPAL TUNNEL RELEASE;  Surgeon: Leandrew Koyanagi, MD;  Location: [facility];  Service: Orthopedics;  Laterality: Left;  Bier block  . CESAREAN SECTION  1997; 2008  . CHOLECYSTECTOMY  2011  . TONSILLECTOMY    . tonsilletomy    . TUBAL LIGATION  04/2007  . VAGINAL HYSTERECTOMY Bilateral 10/17/2019   Procedure: HYSTERECTOMY VAGINAL WITH SALPINGECTOMY;  Surgeon: Chancy Milroy, MD;  Location: [facility];  Service: Gynecology;  Laterality: Bilateral;    There were no vitals filed for this visit.   Subjective Assessment - 03/08/20  1019    Subjective A little better. Still having difficulty with Left arm. Have difficulty sleeping,    Currently in Pain? Yes    Pain Score 6     Pain Location Neck    Pain Orientation Mid;Lower    Pain Descriptors / Indicators Aching    Pain Type Chronic pain    Aggravating Factors  drive, sleep              [facility] PT Assessment - 03/08/20 0001      Observation/Other Assessments   Focus on Therapeutic Outcomes (FOTO)  63% limited       AROM   Cervical - Right Side Bend 30    Cervical - Left Side Bend 30    Cervical - Right Rotation 50    Cervical - Left Rotation 50      Strength   Right Shoulder Flexion 4/5    Right Shoulder ABduction 4/5    Left Shoulder Flexion 4/5    Left Shoulder ABduction 4-/5    Right Hand Grip (lbs) 30   30, 26, 34   Left Hand Grip (lbs) 20   18,20,22                        [facility] Adult PT Treatment/Exercise -  03/08/20 0001      Neck Exercises: Theraband   Scapula Retraction 10 reps;Green    Scapula Retraction Limitations 3 sets    Shoulder Extension 10 reps;Green    Shoulder Extension Limitations 3 sets      Neck Exercises: Supine   Neck Retraction 15 reps;5 secs      Modalities   Modalities Traction      Traction   Type of Traction Cervical    Min (lbs) 10    Max (lbs) 20    Hold Time 60    Rest Time 20    Time 10                       PT Long Term Goals - 03/08/20 1047      PT LONG TERM GOAL #1   Title Pt will be I with HEP for cervical ROM and strength.    Time 6    Period Weeks    Status On-going      PT LONG TERM GOAL #2   Title Pt will report min to no hand, arm symptoms (tingling) with ADLs    Baseline moderate most of the time    Time 6    Period Weeks    Status On-going      PT LONG TERM GOAL #3   Title Pt will improve FOTO score to less than 45% to demo improved functional mobility    Baseline 63% improved from 70% limited    Time 6    Period Weeks    Status On-going      PT  LONG TERM GOAL #4   Title Pt will increase grip strength to at least 10 lbs on each hand    Baseline 20 and 30    Time 6    Period Weeks    Status Achieved      PT LONG TERM GOAL #5   Title Pt will be able to wash, clean and meal prep at home without limitation of neck and arm pain    Baseline still limited    Time 6    Period Weeks    Status On-going      PT LONG TERM GOAL #6   Title Pt will report headaches as more intermittent and no more than 4/10 most of the time    Baseline about the same    Time 6    Period Weeks    Status On-going                 Plan - 03/08/20 1043    Clinical Impression Statement Pt arrives reporting a little bit of improvement. Her strength, ROM and FOTO score have all improved. She still has limitations in function due to left arm pain, left arm N/T as well as neck pain and HA. She has difficulty with ADLs and sleeping due to pain. HA are still frequent. Trial of Mechanical cervical traction today.Will assess benefit and see PT for re-eval at next session.    PT Next Visit Plan re-evaluate to extend POC, assess benefit of traction.    PT Home Exercise Plan [id number]: chin tuck, seated scap retraction, upper trap, extension with towel, standing thoracic rotation reach through; scapular retaction c GTB           Patient will benefit from skilled therapeutic intervention in order to improve the following deficits and impairments:  Impaired flexibility, Increased fascial restricitons, Postural dysfunction, Pain, Impaired UE functional  use, Decreased strength, Decreased range of motion, Obesity, Impaired sensation, Decreased mobility  Visit Diagnosis: Cervicalgia  Muscle weakness (generalized)  Other disturbances of skin sensation     Problem List Patient Active Problem List   Diagnosis Date Noted  . Post-operative state 10/17/2019  . S/P carpal tunnel release 08/30/2019  . Right carpal tunnel syndrome 07/11/2019  . Left carpal tunnel  syndrome 07/11/2019  . Arthritis   . Anemia   . Tracheobronchitis 08/25/2017  . Seasonal allergies 10/08/2016  . Pre-diabetes 10/21/2015  . Bilateral chronic knee pain 09/25/2015  . GASTROESOPHAGEAL REFLUX DISEASE 08/14/2009  . DEPRESSION, SITUATIONAL, PROLONGED 02/05/2009  . UNSPECIFIED VISUAL LOSS 11/27/2008  . OBESITY 05/30/2007  . Moderate persistent asthma with acute exacerbation 12/06/2006    Dorene Ar, PTA 03/08/2020, 10:50 AM  [facility] [address] [place], [place], [address] Phone: [phone]   Fax:  [phone]  Name: Quinnetta Roepke MRN: [id number] Date of Birth: 09-04-1975

## 2020-03-11 ENCOUNTER — Ambulatory Visit: Payer: BC Managed Care – PPO | Admitting: Physical Therapy

## 2020-03-11 ENCOUNTER — Other Ambulatory Visit: Payer: Self-pay

## 2020-03-11 DIAGNOSIS — M542 Cervicalgia: Secondary | ICD-10-CM

## 2020-03-11 DIAGNOSIS — R208 Other disturbances of skin sensation: Secondary | ICD-10-CM

## 2020-03-11 DIAGNOSIS — M6281 Muscle weakness (generalized): Secondary | ICD-10-CM

## 2020-03-11 NOTE — Therapy (Signed)
[place], [place], [address] Phone: [phone]   Fax:  [phone]  Physical Therapy Treatment  Patient Details  Name: Alexandra Norris MRN: [id number] Date of Birth: 10/20/1975 Referring Provider (PT): Dr. Erlinda Hong    Encounter Date: 03/11/2020   PT End of Session - 03/11/20 0838    Visit Number 5    Number of Visits 12    Date for PT Re-Evaluation 04/08/20    Authorization Type BCBS, [place] as completed at visit 4, repeat at 8th or 10th    PT Start Time 0830    PT Stop Time 0930    PT Time Calculation (min) 60 min    Activity Tolerance Patient tolerated treatment well    Behavior During Therapy [facility] for tasks assessed/performed           Past Medical History:  Diagnosis Date  . Anemia   . Arthritis   . Asthma   . Carpal tunnel syndrome   . Dyspnea   . Endometriosis   . History of blood transfusion 1999   w/vaginal delivery  . History of bronchitis    "I've stayed in hospital 2 X w/bronchitis"  . Migraine   . Pneumonia   . PONV (postoperative nausea and vomiting)   . Pre-diabetes 10/03/2019   A1C 6.4    Past Surgical History:  Procedure Laterality Date  . ABDOMINAL HYSTERECTOMY  10/17/2019  . APPENDECTOMY    . CARPAL TUNNEL RELEASE Right 07/19/2019   Procedure: RIGHT CARPAL TUNNEL RELEASE;  Surgeon: Leandrew Koyanagi, MD;  Location: [facility];  Service: Orthopedics;  Laterality: Right;  . CARPAL TUNNEL RELEASE Left 09/13/2019   Procedure: LEFT CARPAL TUNNEL RELEASE;  Surgeon: Leandrew Koyanagi, MD;  Location: [facility];  Service: Orthopedics;  Laterality: Left;  Bier block  . CESAREAN SECTION  1997; 2008  . CHOLECYSTECTOMY  2011  . TONSILLECTOMY    . tonsilletomy    . TUBAL LIGATION  04/2007  . VAGINAL HYSTERECTOMY Bilateral 10/17/2019   Procedure: HYSTERECTOMY VAGINAL WITH SALPINGECTOMY;  Surgeon: Chancy Milroy, MD;  Location: [place] [facility];  Service:  Gynecology;  Laterality: Bilateral;    There were no vitals filed for this visit.   Subjective Assessment - 03/11/20 0840    Subjective Patient here for Re-Evaluation.  The traction last time helped her feel better for a couple of hours. She missed appts due to being sick. Therapy is helping a little bit.  My arm is hurting,below my arm too.  It started a couple days ago (points to ribs).  She wants to continue PT and plans to see Dr. Erlinda Hong soon.    Currently in Pain? Yes    Pain Score 5     Pain Location Arm    Pain Orientation Left    Pain Descriptors / Indicators Aching    Pain Type Chronic pain    Pain Radiating Towards neck to hand L>R    Pain Onset More than a month ago    Pain Frequency Constant    Aggravating Factors  driving , sleep    Pain Relieving Factors tyleno, lying flat , extension of neck    Multiple Pain Sites No              [facility] PT Assessment - 03/11/20 0001      Assessment   Medical Diagnosis neck pain     Referring Provider (PT) Dr. Erlinda Hong  Onset Date/Surgical Date --   6 mos    Hand Dominance Right    Next MD Visit 6 weeks if no better     Prior Therapy No       Precautions   Precautions None      Restrictions   Weight Bearing Restrictions No      Home Environment   Living Environment Private residence    Living Arrangements Spouse/significant other;Children    Additional Comments 2 kids       Prior Function   Level of Independence Independent      Observation/Other Assessments   Focus on Therapeutic Outcomes (FOTO)  63% limited       AROM   Cervical - Right Side Bend 30    Cervical - Left Side Bend 30    Cervical - Right Rotation 50    Cervical - Left Rotation 50      Strength   Right Shoulder Flexion 4/5    Right Shoulder ABduction 4/5    Left Shoulder Flexion 4/5    Left Shoulder ABduction 4-/5    Right Hand Grip (lbs) 30   30, 26, 34   Left Hand Grip (lbs) 20   18,20,22             [facility] Adult PT Treatment/Exercise -  03/11/20 0001      Neck Exercises: Supine   Neck Retraction 10 reps;5 secs    Neck Retraction Limitations then scapualr retraction     Other Supine Exercise narrow grip overhead lift red band x 10       Shoulder Exercises: Supine   Horizontal ABduction Right;Left;10 reps    Theraband Level (Shoulder Horizontal ABduction) Level 2 (Red)      Shoulder Exercises: Standing   Retraction Strengthening;Both;15 reps    Theraband Level (Shoulder Retraction) Level 3 (Green)    Other Standing Exercises adduction, lower trap cues x 15 green       Traction   Type of Traction Cervical    Min (lbs) 10    Max (lbs) 20    Hold Time 60    Rest Time 20    Time 15      Neck Exercises: Stretches   Upper Trapezius Stretch 2 reps;30 seconds    Levator Stretch 2 reps;30 seconds    Lower Cervical/Upper Thoracic Stretch 2 reps;30 seconds                  PT Education - 03/11/20 0920    Education Details explained possible cause of symptoms, HEP and POC    Person(s) Educated Patient    Methods Explanation;Demonstration    Comprehension Verbalized understanding;Returned demonstration               PT Long Term Goals - 03/11/20 4166      PT LONG TERM GOAL #1   Title Pt will be I with HEP for cervical ROM and strength.    Baseline up to date    Time 4    Period Weeks    Status On-going    Target Date 04/08/20      PT LONG TERM GOAL #2   Title Pt will report min to no hand, arm symptoms (tingling) with ADLs    Baseline moderate most of the time    Time 4    Period Weeks    Status On-going    Target Date 04/08/20      PT LONG TERM GOAL #3   Title  Pt will improve FOTO score to less than 45% to demo improved functional mobility    Baseline 63% improved from 70% limited    Time 4    Period Weeks    Status On-going    Target Date 04/08/20      PT LONG TERM GOAL #4   Title Pt will increase grip strength to at least 10 lbs on each hand    Baseline 20 and 30    Time 4     Period Weeks    Status Achieved    Target Date 04/08/20      PT LONG TERM GOAL #5   Title Pt will be able to wash, clean and meal prep at home without limitation of neck and arm pain    Baseline still limited    Time 4    Period Weeks    Status On-going    Target Date 04/08/20      PT LONG TERM GOAL #6   Title Pt will report headaches as more intermittent and no more than 4/10 most of the time    Baseline about the same    Time 4    Period Weeks    Status On-going    Target Date 04/08/20                 Plan - 03/11/20 1660    Clinical Impression Statement Patient presents with minor improvements in functional mobility and use of UEs for home.  She has increase cervical ROM and UE strength.  She needs more time to complete her visits as she was sick.  May follow up with Dr. Erlinda Hong soon for more diagnostics, imaging.    PT Treatment/Interventions ADLs/Self Care Home Management;Electrical Stimulation;Moist Heat;Traction;Therapeutic activities;Functional mobility training;Therapeutic exercise;Patient/family education;Dry needling;Passive range of motion;Manual techniques;Neuromuscular re-education    PT Next Visit Plan add to HEP for stabilization, repeat traction/manual    PT Home Exercise Plan [id number]: chin tuck, seated scap retraction, upper trap, extension with towel, standing thoracic rotation reach through; scapular retaction c GTB    Consulted and Agree with Plan of Care Patient           Patient will benefit from skilled therapeutic intervention in order to improve the following deficits and impairments:  Impaired flexibility, Increased fascial restricitons, Postural dysfunction, Pain, Impaired UE functional use, Decreased strength, Decreased range of motion, Obesity, Impaired sensation, Decreased mobility  Visit Diagnosis: Cervicalgia  Muscle weakness (generalized)  Other disturbances of skin sensation     Problem List Patient Active Problem List   Diagnosis  Date Noted  . Post-operative state 10/17/2019  . S/P carpal tunnel release 08/30/2019  . Right carpal tunnel syndrome 07/11/2019  . Left carpal tunnel syndrome 07/11/2019  . Arthritis   . Anemia   . Tracheobronchitis 08/25/2017  . Seasonal allergies 10/08/2016  . Pre-diabetes 10/21/2015  . Bilateral chronic knee pain 09/25/2015  . GASTROESOPHAGEAL REFLUX DISEASE 08/14/2009  . DEPRESSION, SITUATIONAL, PROLONGED 02/05/2009  . UNSPECIFIED VISUAL LOSS 11/27/2008  . OBESITY 05/30/2007  . Moderate persistent asthma with acute exacerbation 12/06/2006    Alexandra Norris 03/11/2020, 9:37 AM  [place], [place], [address] Phone: [phone]   Fax:  [phone]  Name: Alexandra Norris MRN: [id number] Date of Birth: [birth year]  Raeford Razor, PT 03/11/20 9:38 AM Phone: [phone] Fax: [phone]

## 2020-03-18 ENCOUNTER — Ambulatory Visit: Payer: BC Managed Care – PPO | Admitting: Physical Therapy

## 2020-03-18 ENCOUNTER — Telehealth: Payer: Self-pay | Admitting: Physical Therapy

## 2020-03-18 NOTE — Telephone Encounter (Signed)
Spoke to patient regarding no show to her appointment today. She reports that she is very sick with fever and cough. I asked her to call and cancel her next appointment if she is still sick.

## 2020-03-19 ENCOUNTER — Other Ambulatory Visit: Payer: Self-pay | Admitting: Physician Assistant

## 2020-03-19 ENCOUNTER — Ambulatory Visit: Payer: Self-pay

## 2020-03-19 ENCOUNTER — Encounter: Payer: Self-pay | Admitting: Orthopaedic Surgery

## 2020-03-19 ENCOUNTER — Ambulatory Visit (INDEPENDENT_AMBULATORY_CARE_PROVIDER_SITE_OTHER): Payer: BC Managed Care – PPO | Admitting: Orthopaedic Surgery

## 2020-03-19 VITALS — Ht 61.42 in | Wt 242.2 lb

## 2020-03-19 DIAGNOSIS — M79602 Pain in left arm: Secondary | ICD-10-CM

## 2020-03-19 DIAGNOSIS — M542 Cervicalgia: Secondary | ICD-10-CM

## 2020-03-19 MED ORDER — TRAMADOL HCL 50 MG PO TABS: 50.0000 mg | ORAL_TABLET | Freq: Three times a day (TID) | ORAL | 0 refills | Status: DC | PRN

## 2020-03-19 MED ORDER — DICLOFENAC SODIUM 75 MG PO TBEC: 75.0000 mg | DELAYED_RELEASE_TABLET | Freq: Two times a day (BID) | ORAL | 0 refills | Status: DC | PRN

## 2020-03-19 NOTE — Progress Notes (Signed)
Office Visit Note   Patient: Alexandra Norris           Date of Birth: Nov 01, 1975           MRN: [id number] Visit Date: 03/19/2020              Requested by: Charlott Rakes, MD [place],  [place] [address] PCP: Charlott Rakes, MD   Assessment & Plan: Visit Diagnoses:  1. Neck pain   2. Left arm pain     Plan: Impression is chronic left sided neck pain and left upper extremity radiculopathy.  At this point, we will obtain a cervical spine MRI to assess for structural abnormalities.  She will follow up with [place] once has been completed.  I have called in Voltaren pills and tramadol to take in the meantime.  Follow-Up Instructions: Return for after MRI.   Orders:  No orders of the defined types were placed in this encounter.  Meds ordered this encounter  Medications   diclofenac (VOLTAREN) 75 MG EC tablet    Sig: Take 1 tablet (75 mg total) by mouth 2 (two) times daily as needed.    Dispense:  60 tablet    Refill:  0   traMADol (ULTRAM) 50 MG tablet    Sig: Take 1 tablet (50 mg total) by mouth 3 (three) times daily as needed.    Dispense:  30 tablet    Refill:  0      Procedures: No procedures performed   Clinical Data: No additional findings.   Subjective: Chief Complaint  Patient presents with   Neck - Pain   Left Arm - Pain    HPI patient is a pleasant 44 year old Spanish-speaking female who is here today with an interpreter.  She is here with continued neck pain.  We saw her approximately 2 months ago for this where she was started on a Sterapred taper, muscle relaxers and was sent to outpatient physical therapy.  She noticed very transient relief in symptoms following this treatment.  She comes in today with continued pain that has actually worsened over the past week.  The pain she has is to the lateral neck and into the parascapular region.  She has associated headaches and numbness into the left ring finger.  No new  weakness.  Review of Systems as detailed in HPI.  All others reviewed and are negative.   Objective: Vital Signs: Ht 5' 1.42" (1.56 m)    Wt 242 lb 3.2 oz (109.9 kg)    LMP 09/20/2019 (Exact Date)    BMI 45.14 kg/m   Physical Exam well-developed well-nourished female no acute distress.  Alert oriented x3.  Ortho Exam cervical spine exam shows diffuse tenderness throughout the spine and paraspinous musculature.  She has tenderness to the parascapular region.  Pain and limited range of motion in all planes.  No focal weakness.  Specialty Comments:  No specialty comments available.  Imaging: No new imaging   PMFS History: Patient Active Problem List   Diagnosis Date Noted   Post-operative state 10/17/2019   S/P carpal tunnel release 08/30/2019   Right carpal tunnel syndrome 07/11/2019   Left carpal tunnel syndrome 07/11/2019   Arthritis    Anemia    Tracheobronchitis 08/25/2017   Seasonal allergies 10/08/2016   Pre-diabetes 10/21/2015   Bilateral chronic knee pain 09/25/2015   GASTROESOPHAGEAL REFLUX DISEASE 08/14/2009   DEPRESSION, SITUATIONAL, PROLONGED 02/05/2009   UNSPECIFIED VISUAL LOSS 11/27/2008   OBESITY 05/30/2007  Moderate persistent asthma with acute exacerbation 12/06/2006   Past Medical History:  Diagnosis Date   Anemia    Arthritis    Asthma    Carpal tunnel syndrome    Dyspnea    Endometriosis    History of blood transfusion 1999   w/vaginal delivery   History of bronchitis    "I've stayed in hospital 2 X w/bronchitis"   Migraine    Pneumonia    PONV (postoperative nausea and vomiting)    Pre-diabetes 10/03/2019   A1C 6.4    Family History  Problem Relation Age of Onset   Diabetes Mother    Osteoarthritis Mother    Hypertension Mother    Hyperlipidemia Mother    Arthritis Mother     Past Surgical History:  Procedure Laterality Date   ABDOMINAL HYSTERECTOMY  10/17/2019   APPENDECTOMY     CARPAL TUNNEL  RELEASE Right 07/19/2019   Procedure: RIGHT CARPAL TUNNEL RELEASE;  Surgeon: Leandrew Koyanagi, MD;  Location: [place];  Service: Orthopedics;  Laterality: Right;   CARPAL TUNNEL RELEASE Left 09/13/2019   Procedure: LEFT CARPAL TUNNEL RELEASE;  Surgeon: Leandrew Koyanagi, MD;  Location: [place];  Service: Orthopedics;  Laterality: Left;  Bier block   CESAREAN SECTION  1997; 2008   CHOLECYSTECTOMY  2011   TONSILLECTOMY     tonsilletomy     TUBAL LIGATION  04/2007   VAGINAL HYSTERECTOMY Bilateral 10/17/2019   Procedure: HYSTERECTOMY VAGINAL WITH SALPINGECTOMY;  Surgeon: Chancy Milroy, MD;  Location: [facility];  Service: Gynecology;  Laterality: Bilateral;   Social History   Occupational History   Not on file  Tobacco Use   Smoking status: Never Smoker   Smokeless tobacco: Never Used  Vaping Use   Vaping Use: Never used  Substance and Sexual Activity   Alcohol use: No   Drug use: No   Sexual activity: Yes    Birth control/protection: Surgical

## 2020-03-20 ENCOUNTER — Other Ambulatory Visit: Payer: Self-pay

## 2020-03-20 ENCOUNTER — Ambulatory Visit: Payer: Self-pay

## 2020-03-20 DIAGNOSIS — M542 Cervicalgia: Secondary | ICD-10-CM

## 2020-03-22 ENCOUNTER — Ambulatory Visit: Payer: BC Managed Care – PPO | Admitting: Physical Therapy

## 2020-03-22 ENCOUNTER — Encounter: Payer: Self-pay | Admitting: Physical Therapy

## 2020-03-22 ENCOUNTER — Other Ambulatory Visit: Payer: Self-pay

## 2020-03-22 DIAGNOSIS — M542 Cervicalgia: Secondary | ICD-10-CM

## 2020-03-22 DIAGNOSIS — R208 Other disturbances of skin sensation: Secondary | ICD-10-CM

## 2020-03-22 DIAGNOSIS — M6281 Muscle weakness (generalized): Secondary | ICD-10-CM

## 2020-03-22 NOTE — Therapy (Addendum)
[place], [place], [address] Phone: [phone]   Fax:  [phone]  Physical Therapy Treatment/Discharge  Patient Details  Name: Alexandra Norris MRN: [id number] Date of Birth: Dec 07, 1975 Referring Provider (PT): Dr. Erlinda Hong    Encounter Date: 03/22/2020   PT End of Session - 03/22/20 0858    Visit Number 6    Number of Visits 12    Date for PT Re-Evaluation 04/08/20    Authorization Type [employer], [place] as completed at visit 4, repeat at 8th or 10th    PT Start Time 0850    PT Stop Time 0935    PT Time Calculation (min) 45 min           Past Medical History:  Diagnosis Date  . Anemia   . Arthritis   . Asthma   . Carpal tunnel syndrome   . Dyspnea   . Endometriosis   . History of blood transfusion 1999   w/vaginal delivery  . History of bronchitis    "I've stayed in hospital 2 X w/bronchitis"  . Migraine   . Pneumonia   . PONV (postoperative nausea and vomiting)   . Pre-diabetes 10/03/2019   A1C 6.4    Past Surgical History:  Procedure Laterality Date  . ABDOMINAL HYSTERECTOMY  10/17/2019  . APPENDECTOMY    . CARPAL TUNNEL RELEASE Right 07/19/2019   Procedure: RIGHT CARPAL TUNNEL RELEASE;  Surgeon: Leandrew Koyanagi, MD;  Location: [place];  Service: Orthopedics;  Laterality: Right;  . CARPAL TUNNEL RELEASE Left 09/13/2019   Procedure: LEFT CARPAL TUNNEL RELEASE;  Surgeon: Leandrew Koyanagi, MD;  Location: [place];  Service: Orthopedics;  Laterality: Left;  Bier block  . CESAREAN SECTION  1997; 2008  . CHOLECYSTECTOMY  2011  . TONSILLECTOMY    . tonsilletomy    . TUBAL LIGATION  04/2007  . VAGINAL HYSTERECTOMY Bilateral 10/17/2019   Procedure: HYSTERECTOMY VAGINAL WITH SALPINGECTOMY;  Surgeon: Chancy Milroy, MD;  Location: [facility];  Service: Gynecology;  Laterality: Bilateral;    There were no vitals filed for this visit.   Subjective Assessment -  03/22/20 0845    Subjective MD changed medicine and it helped my pain. I have an MRI Nov 3rd for my neck.    Currently in Pain? Yes    Pain Score 3     Pain Location Shoulder    Pain Orientation Left    Pain Descriptors / Indicators Tingling;Numbness;Aching    Pain Type Chronic pain    Aggravating Factors  constant , reaching up and out    Pain Relieving Factors new meds    Pain Score 3    Pain Location Head    Pain Orientation Posterior    Pain Descriptors / Indicators Aching    Pain Type Chronic pain    Aggravating Factors  constant    Pain Relieving Factors new meds                             [facility] Adult PT Treatment/Exercise - 03/22/20 0001      Neck Exercises: Standing   Neck Retraction 10 reps    Neck Retraction Limitations towel roll     Other Standing Exercises cervical extension to chin tuck with towel roll at neck     Other Standing Exercises scap retract into wall x 10 , face wall upper trap setting x 10  Neck Exercises: Supine   Neck Retraction 10 reps;5 secs    Neck Retraction Limitations towel roll       Shoulder Exercises: Supine   Horizontal ABduction Right;Left;15 reps    Theraband Level (Shoulder Horizontal ABduction) Level 2 (Red)    External Rotation 15 reps    Theraband Level (Shoulder External Rotation) Level 2 (Red)      Shoulder Exercises: Standing   Extension Strengthening;Both;10 reps    Theraband Level (Shoulder Extension) Level 3 (Green)    Retraction Strengthening;Both;15 reps    Theraband Level (Shoulder Retraction) Level 3 (Green)      Traction   Type of Traction Cervical    Min (lbs) 10    Max (lbs) 20    Hold Time 60    Rest Time 20    Time 15                       PT Long Term Goals - 03/11/20 0630      PT LONG TERM GOAL #1   Title Pt will be I with HEP for cervical ROM and strength.    Baseline up to date    Time 4    Period Weeks    Status On-going    Target Date 04/08/20      PT  LONG TERM GOAL #2   Title Pt will report min to no hand, arm symptoms (tingling) with ADLs    Baseline moderate most of the time    Time 4    Period Weeks    Status On-going    Target Date 04/08/20      PT LONG TERM GOAL #3   Title Pt will improve FOTO score to less than 45% to demo improved functional mobility    Baseline 63% improved from 70% limited    Time 4    Period Weeks    Status On-going    Target Date 04/08/20      PT LONG TERM GOAL #4   Title Pt will increase grip strength to at least 10 lbs on each hand    Baseline 20 and 30    Time 4    Period Weeks    Status Achieved    Target Date 04/08/20      PT LONG TERM GOAL #5   Title Pt will be able to wash, clean and meal prep at home without limitation of neck and arm pain    Baseline still limited    Time 4    Period Weeks    Status On-going    Target Date 04/08/20      PT LONG TERM GOAL #6   Title Pt will report headaches as more intermittent and no more than 4/10 most of the time    Baseline about the same    Time 4    Period Weeks    Status On-going    Target Date 04/08/20                 Plan - 03/22/20 0858    Clinical Impression Statement Pt reports change in medicince has helped her head and neck pain. Cervical traction helps her pain. SHe continues with constant posterior head and left shoulder pain at less intensity of 3/10. Continued with cervical and scap stab with cervical traction at end of session.    PT Next Visit Plan add to HEP for stabilization, repeat traction/manual    PT Home Exercise Plan [id number]: chin  tuck, seated scap retraction, upper trap, extension with towel, standing thoracic rotation reach through; scapular retaction c GTB           Patient will benefit from skilled therapeutic intervention in order to improve the following deficits and impairments:  Impaired flexibility, Increased fascial restricitons, Postural dysfunction, Pain, Impaired UE functional use, Decreased  strength, Decreased range of motion, Obesity, Impaired sensation, Decreased mobility  Visit Diagnosis: Cervicalgia  Muscle weakness (generalized)  Other disturbances of skin sensation     Problem List Patient Active Problem List   Diagnosis Date Noted  . Post-operative state 10/17/2019  . S/P carpal tunnel release 08/30/2019  . Right carpal tunnel syndrome 07/11/2019  . Left carpal tunnel syndrome 07/11/2019  . Arthritis   . Anemia   . Tracheobronchitis 08/25/2017  . Seasonal allergies 10/08/2016  . Pre-diabetes 10/21/2015  . Bilateral chronic knee pain 09/25/2015  . GASTROESOPHAGEAL REFLUX DISEASE 08/14/2009  . DEPRESSION, SITUATIONAL, PROLONGED 02/05/2009  . UNSPECIFIED VISUAL LOSS 11/27/2008  . OBESITY 05/30/2007  . Moderate persistent asthma with acute exacerbation 12/06/2006    Dorene Ar, PTA 03/22/2020, 11:01 AM  [facility] [address] [place], [place], [address] Phone: [phone]   Fax:  [phone]  Name: Alexandra Norris MRN: [id number] Date of Birth: May 26, 1976  PHYSICAL THERAPY DISCHARGE SUMMARY  Visits from Start of Care: 6  Current functional level related to goals / functional outcomes: See above    Remaining deficits: Pain, posture, neck stiffness and UE weakness    Education / Equipment: HEP, posture, traction  Plan: Patient agrees to discharge.  Patient goals were not met. Patient is being discharged due to not returning since the last visit.  ?????    Raeford Razor, PT 05/23/20 9:05 AM Phone: [phone] Fax: [phone]

## 2020-03-25 ENCOUNTER — Telehealth: Payer: Self-pay | Admitting: Physical Therapy

## 2020-03-25 ENCOUNTER — Ambulatory Visit: Payer: BC Managed Care – PPO | Admitting: Physical Therapy

## 2020-03-25 NOTE — Telephone Encounter (Signed)
Left message regarding no show to appointment today. Left reminder of attendance policy and asked that she call prior to future appointment if she cannot attend.

## 2020-03-27 ENCOUNTER — Ambulatory Visit: Payer: BC Managed Care – PPO | Admitting: Physician Assistant

## 2020-03-27 ENCOUNTER — Telehealth: Payer: Self-pay

## 2020-03-27 ENCOUNTER — Other Ambulatory Visit: Payer: Self-pay

## 2020-03-27 DIAGNOSIS — R7303 Prediabetes: Secondary | ICD-10-CM

## 2020-03-27 NOTE — Telephone Encounter (Signed)
Called pt via [phone] [place], [place]) and rescheduled for in-person appt w/ Dr. Leanne Chang for 10/27 @ 2pm.

## 2020-03-27 NOTE — Telephone Encounter (Signed)
Contacted via [facility] [place], [id number]) to inform of need to reschedule today's virtual appt due to provider out, pt aware and voiced understanding, someone will reach out to reschedule

## 2020-03-29 ENCOUNTER — Ambulatory Visit: Payer: BC Managed Care – PPO

## 2020-03-29 ENCOUNTER — Ambulatory Visit
Admission: RE | Admit: 2020-03-29 | Discharge: 2020-03-29 | Disposition: A | Discharge status: Home / Self Care | Payer: BC Managed Care – PPO | Source: Ambulatory Visit | Attending: Orthopaedic Surgery | Admitting: Orthopaedic Surgery

## 2020-03-29 DIAGNOSIS — M542 Cervicalgia: Secondary | ICD-10-CM

## 2020-03-29 IMAGING — MR MR CERVICAL SPINE W/O CM
4 of 5 series · 25 of 48 positions shown · non-contrast
Comparison: Plain films [DATE].

CLINICAL DATA: Neck pain, chronic.

EXAM:
MRI CERVICAL SPINE WITHOUT CONTRAST
TECHNIQUE: Multiplanar, multisequence MR imaging of the cervical spine was
performed. No intravenous contrast was administered.

[Series 2: T2 · sagittal · 3.0mm · 0.41mm/px · 8 of 17 slices shown (1 of 2)]
[im 1/17]
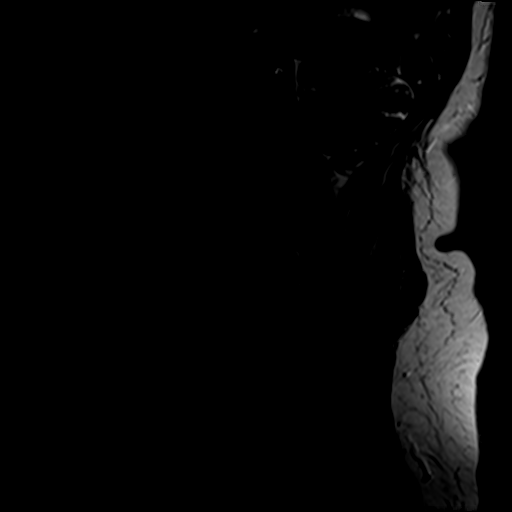
[im 3/17]
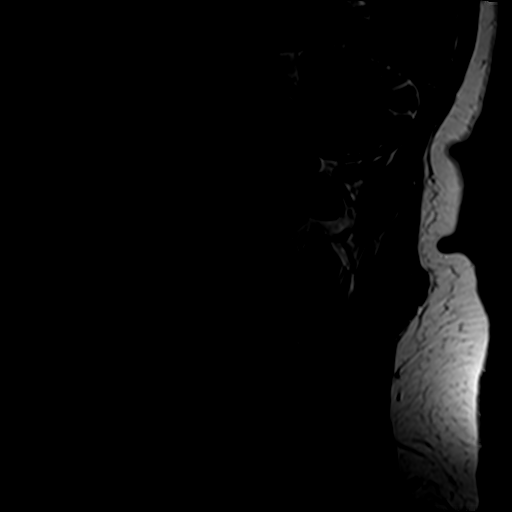
[im 5/17]
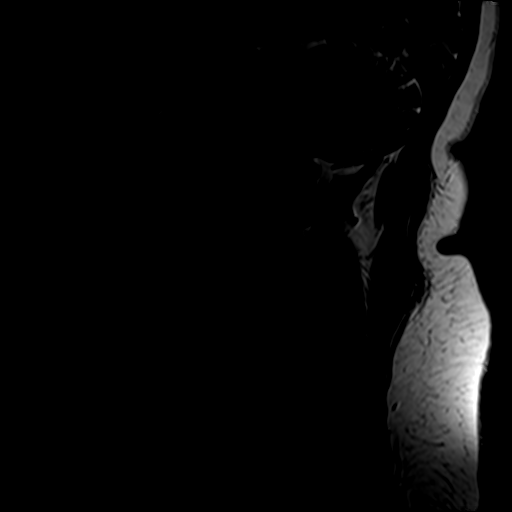
[im 7/17]
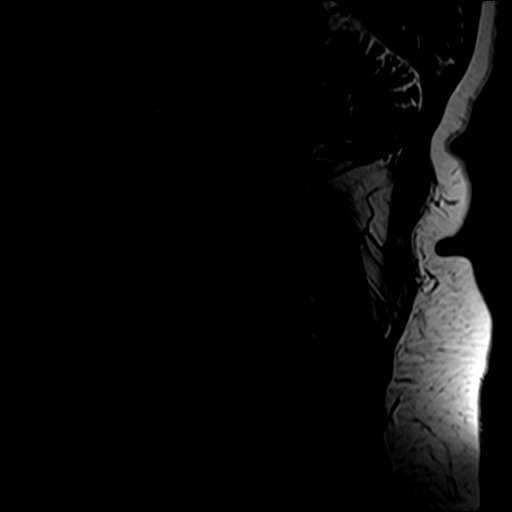
[im 10/17]
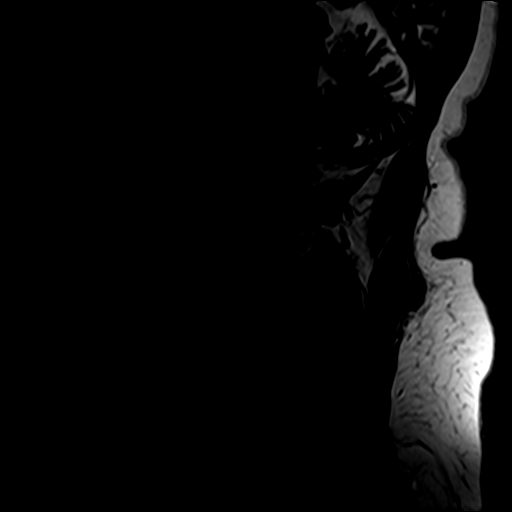
[im 12/17]
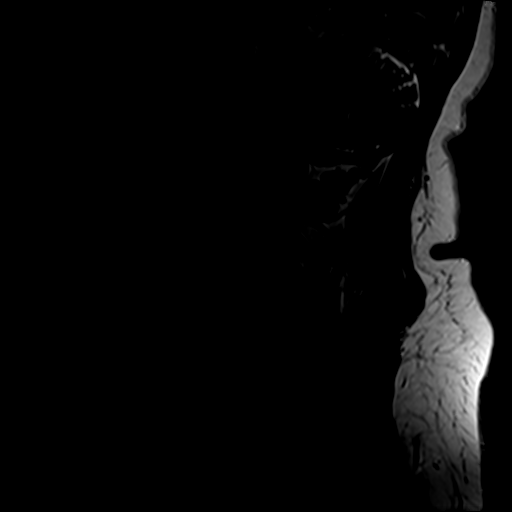
[im 14/17]
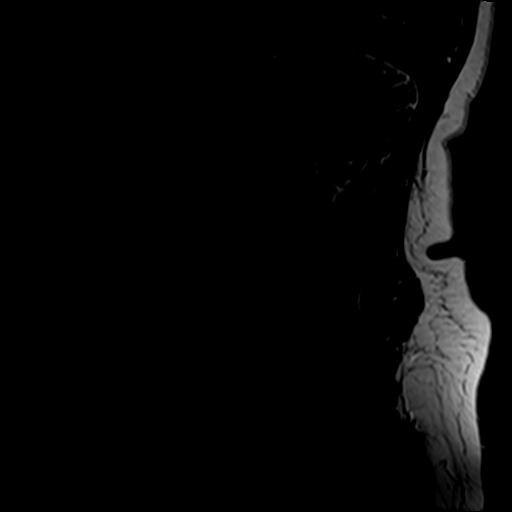
[im 17/17]
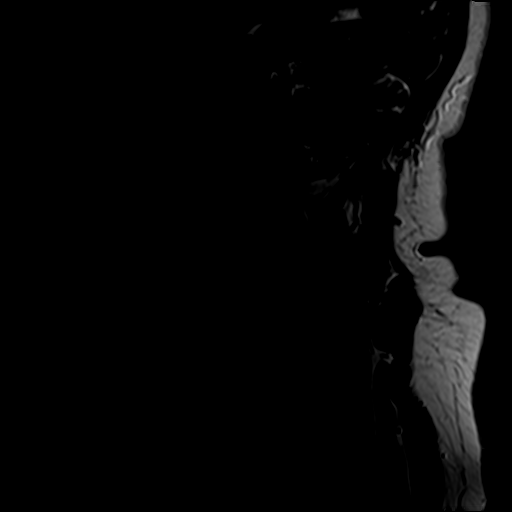

[Series 3: T1 · sagittal · 3.0mm · 0.41mm/px · 5 of 17 slices shown (1 of 2)]
[im 1/17]
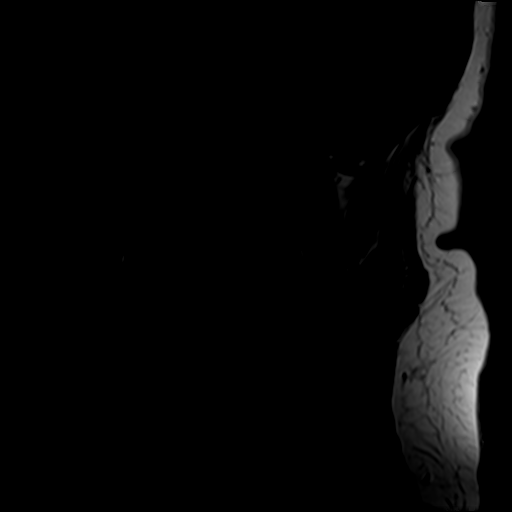
[im 3/17]
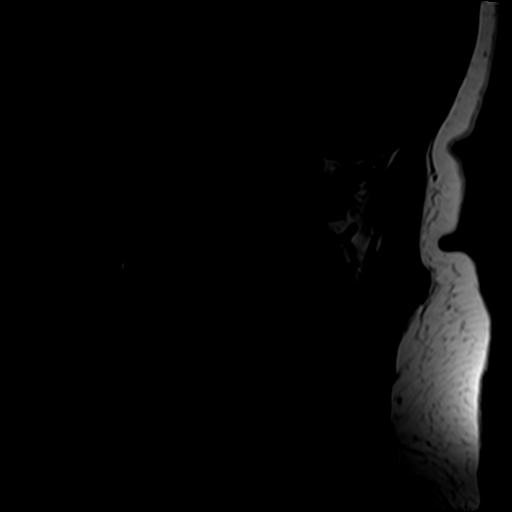
[im 5/17]
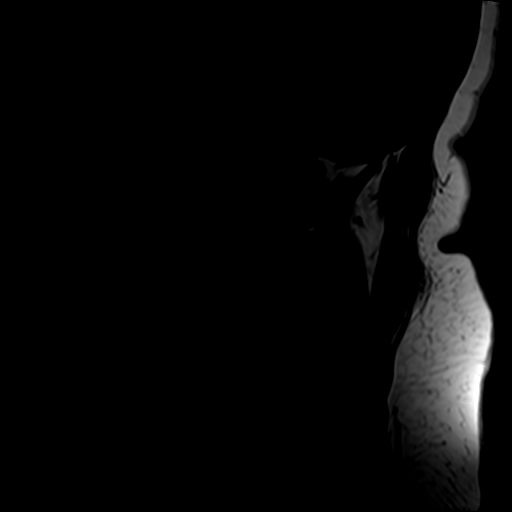
[im 10/17]
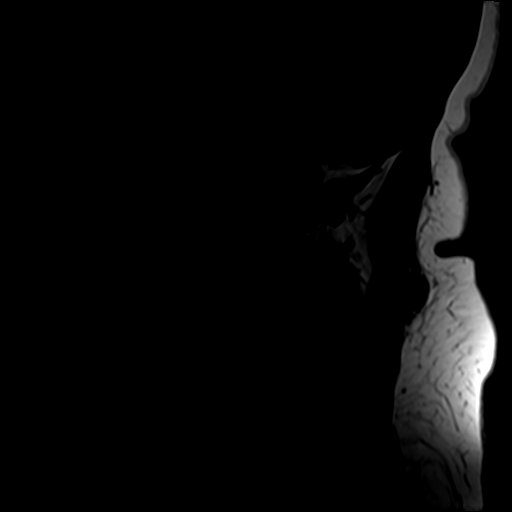
[im 14/17]
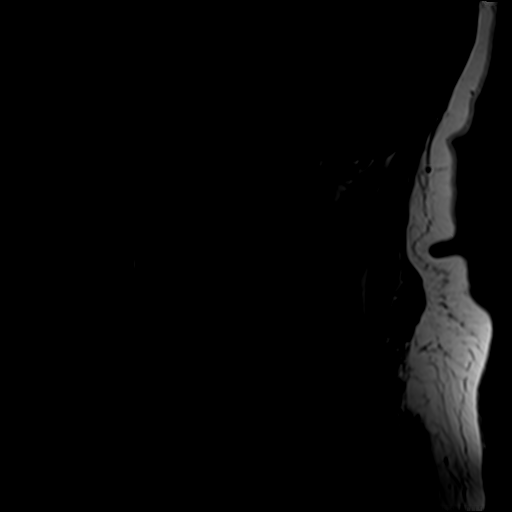

[Series 5: T2 · axial · 3.0mm · 0.70mm/px · z∈[-39,+54]mm · 9 of 26 slices shown (2 of 2)]
[im 1/26]
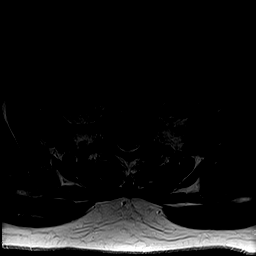
[im 5/26]
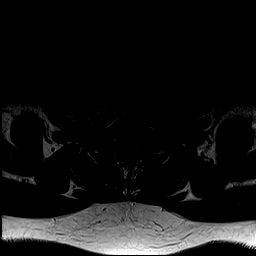
[im 7/26]
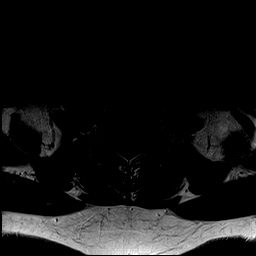
[im 12/26]
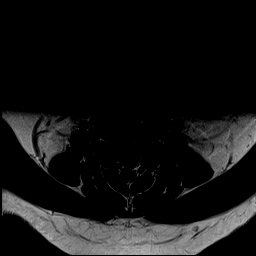
[im 14/26]
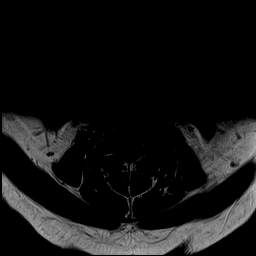
[im 19/26]
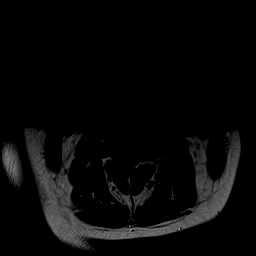
[im 21/26]
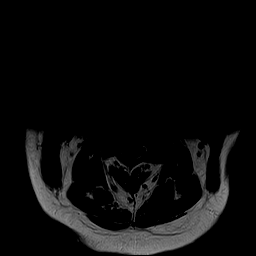
[im 23/26]
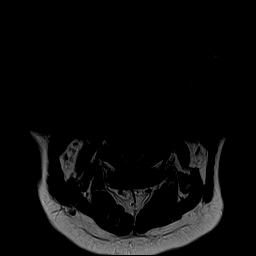
[im 26/26]
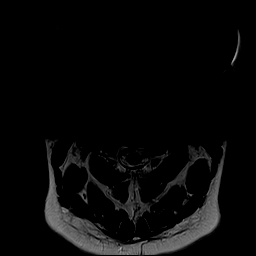

[Series 6: T1 · axial · 3.0mm · 0.35mm/px · z∈[-24,+43]mm · 3 of 26 slices shown (2 of 2)]
[im 5/26]
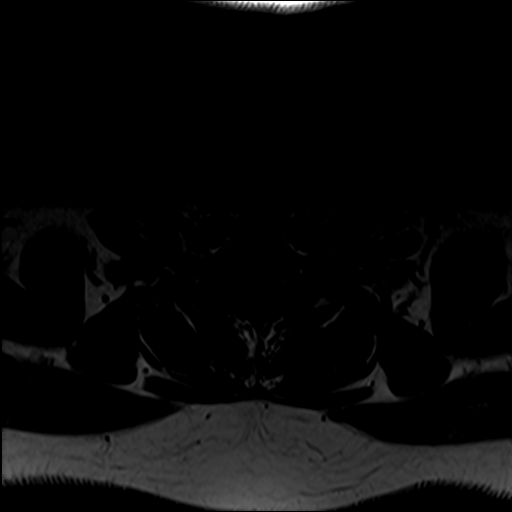
[im 14/26]
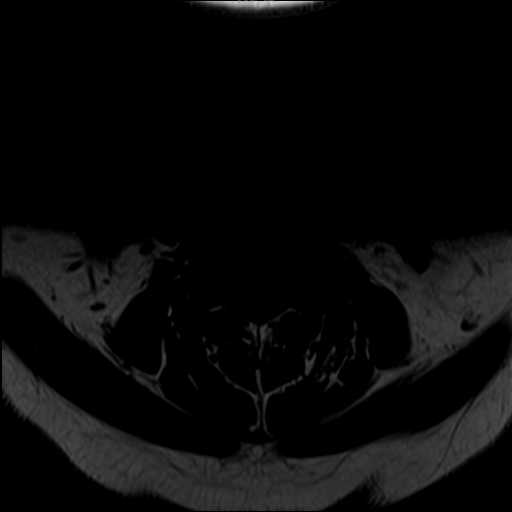
[im 23/26]
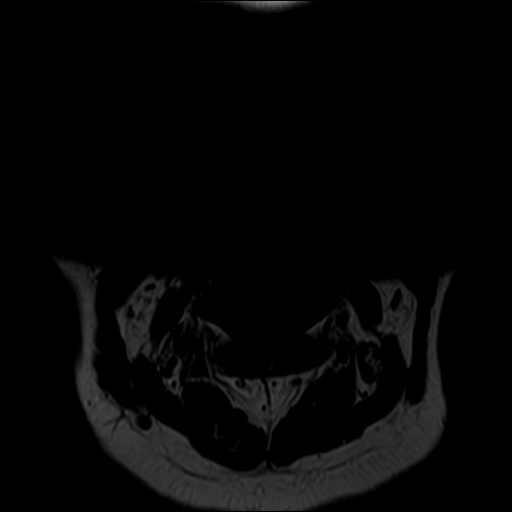

[25 of 48 positions shown; findings below may reference images not displayed]

FINDINGS: The study is partially degraded by susceptibility artifact from
dental hardware.

Alignment: Mild reversal of the cervical curvature.

Vertebrae: No fracture, evidence of discitis, or bone lesion.

Cord: Normal signal and morphology.

Posterior Fossa, vertebral arteries, paraspinal tissues: Negative.

Disc levels:

C2-3: No spinal canal or neural foraminal stenosis.

C3-4: Small posterior disc protrusion resulting in mild spinal canal
stenosis. Uncovertebral and facet degenerative changes resulting in
mild left neural foraminal narrowing.

C4-5: Small posterior disc protrusion resulting in mild spinal canal
stenosis. Uncovertebral and facet degenerative changes resulting in
mild left neural foraminal narrowing.

C5-6: Posterior disc protrusion resulting in mild spinal canal
stenosis. Uncovertebral and facet degenerative changes resulting in
mild-to-moderate right and mild left neural foraminal narrowing.

C7-T1: Posterior disc protrusion resulting in mild spinal canal
stenosis. Uncovertebral and facet degenerative changes without
significant neural foraminal narrowing.

C7-T1. Facet degenerative changes resulting in mild left neural
foraminal narrowing. No spinal canal stenosis.
IMPRESSION: 1. Mild multilevel degenerative changes of the cervical spine as
described above, with mild spinal canal stenosis from C3-4 through
C7-T1.
2. Mild-to-moderate right and mild left neural foraminal narrowing
at C5-6.

## 2020-04-01 ENCOUNTER — Ambulatory Visit: Payer: BC Managed Care – PPO | Admitting: Physical Therapy

## 2020-04-02 ENCOUNTER — Ambulatory Visit: Payer: BC Managed Care – PPO | Admitting: Orthopaedic Surgery

## 2020-04-02 ENCOUNTER — Encounter: Payer: Self-pay | Admitting: Orthopaedic Surgery

## 2020-04-02 ENCOUNTER — Other Ambulatory Visit: Payer: Self-pay

## 2020-04-02 ENCOUNTER — Other Ambulatory Visit: Payer: Self-pay | Admitting: Physician Assistant

## 2020-04-02 DIAGNOSIS — M5412 Radiculopathy, cervical region: Secondary | ICD-10-CM

## 2020-04-02 MED ORDER — TRAMADOL HCL 50 MG PO TABS: 50.0000 mg | ORAL_TABLET | Freq: Three times a day (TID) | ORAL | 0 refills | Status: DC | PRN

## 2020-04-02 NOTE — Progress Notes (Signed)
Office Visit Note   Patient: Alexandra Norris           Date of Birth: 02/26/1976           MRN: [id number] Visit Date: 04/02/2020              Requested by: Charlott Rakes, MD [place],  [place] [address] PCP: Charlott Rakes, MD   Assessment & Plan: Visit Diagnoses:  1. Radiculopathy of cervical spine     Plan: Impression is mild multilevel degenerative changes with mild spinal canal stenosis C3-4 through C7-T1 in addition to mild to moderate right and mild left neuroforaminal narrowing C5-6.  At this point, we will refer the patient to Dr. Ernestina Patches for [facility].  I called in tramadol to take in the meantime.  She will follow up with [place] as needed.   Follow-Up Instructions: Return if symptoms worsen or fail to improve.   Orders:  No orders of the defined types were placed in this encounter.  Meds ordered this encounter  Medications  . traMADol (ULTRAM) 50 MG tablet    Sig: Take 1 tablet (50 mg total) by mouth 3 (three) times daily as needed.    Dispense:  30 tablet    Refill:  0      Procedures: No procedures performed   Clinical Data: No additional findings.   Subjective: Chief Complaint  Patient presents with  . Neck - Pain    HPI patient is a pleasant 44 year old Spanish-speaking female who comes in today with interpreter to go over MRI results of the cervical spine.  She has a longstanding history of right upper extremity cervical spine radiculopathy.  We have seen her for this a few times in the past.  She has been on oral NSAIDs, steroid tapers, muscle relaxers and even sent outpatient physical therapy without long-lasting relief of symptoms.  Subsequent MRI of cervical spine was ordered which showed mild multilevel degenerative changes with mild spinal canal stenosis C3-4 through C7-T1 in addition to mild to moderate right and mild left neuroforaminal narrowing C5-6.     Objective: Vital Signs: LMP 09/20/2019 (Exact Date)      Ortho Exam stable cervical spine exam  Specialty Comments:  No specialty comments available.  Imaging: No new imaging   PMFS History: Patient Active Problem List   Diagnosis Date Noted  . Post-operative state 10/17/2019  . S/P carpal tunnel release 08/30/2019  . Right carpal tunnel syndrome 07/11/2019  . Left carpal tunnel syndrome 07/11/2019  . Arthritis   . Anemia   . Tracheobronchitis 08/25/2017  . Seasonal allergies 10/08/2016  . Pre-diabetes 10/21/2015  . Bilateral chronic knee pain 09/25/2015  . GASTROESOPHAGEAL REFLUX DISEASE 08/14/2009  . DEPRESSION, SITUATIONAL, PROLONGED 02/05/2009  . UNSPECIFIED VISUAL LOSS 11/27/2008  . OBESITY 05/30/2007  . Moderate persistent asthma with acute exacerbation 12/06/2006   Past Medical History:  Diagnosis Date  . Anemia   . Arthritis   . Asthma   . Carpal tunnel syndrome   . Dyspnea   . Endometriosis   . History of blood transfusion 1999   w/vaginal delivery  . History of bronchitis    "I've stayed in hospital 2 X w/bronchitis"  . Migraine   . Pneumonia   . PONV (postoperative nausea and vomiting)   . Pre-diabetes 10/03/2019   A1C 6.4    Family History  Problem Relation Age of Onset  . Diabetes Mother   . Osteoarthritis Mother   . Hypertension Mother   .  Hyperlipidemia Mother   . Arthritis Mother     Past Surgical History:  Procedure Laterality Date  . ABDOMINAL HYSTERECTOMY  10/17/2019  . APPENDECTOMY    . CARPAL TUNNEL RELEASE Right 07/19/2019   Procedure: RIGHT CARPAL TUNNEL RELEASE;  Surgeon: Leandrew Koyanagi, MD;  Location: [place];  Service: Orthopedics;  Laterality: Right;  . CARPAL TUNNEL RELEASE Left 09/13/2019   Procedure: LEFT CARPAL TUNNEL RELEASE;  Surgeon: Leandrew Koyanagi, MD;  Location: [place];  Service: Orthopedics;  Laterality: Left;  Bier block  . CESAREAN SECTION  1997; 2008  . CHOLECYSTECTOMY  2011  . TONSILLECTOMY    . tonsilletomy    . TUBAL LIGATION   04/2007  . VAGINAL HYSTERECTOMY Bilateral 10/17/2019   Procedure: HYSTERECTOMY VAGINAL WITH SALPINGECTOMY;  Surgeon: Chancy Milroy, MD;  Location: [place];  Service: Gynecology;  Laterality: Bilateral;   Social History   Occupational History  . Not on file  Tobacco Use  . Smoking status: Never Smoker  . Smokeless tobacco: Never Used  Vaping Use  . Vaping Use: Never used  Substance and Sexual Activity  . Alcohol use: No  . Drug use: No  . Sexual activity: Yes    Birth control/protection: Surgical

## 2020-04-03 ENCOUNTER — Ambulatory Visit: Payer: BC Managed Care – PPO | Attending: Family Medicine | Admitting: Internal Medicine

## 2020-04-03 ENCOUNTER — Ambulatory Visit (HOSPITAL_BASED_OUTPATIENT_CLINIC_OR_DEPARTMENT_OTHER): Payer: BC Managed Care – PPO | Admitting: Pharmacist

## 2020-04-03 ENCOUNTER — Encounter: Payer: Self-pay | Admitting: Internal Medicine

## 2020-04-03 ENCOUNTER — Other Ambulatory Visit: Payer: Self-pay

## 2020-04-03 VITALS — BP 114/66 | HR 78 | Resp 16 | Wt 239.2 lb

## 2020-04-03 DIAGNOSIS — M50122 Cervical disc disorder at C5-C6 level with radiculopathy: Secondary | ICD-10-CM | POA: Insufficient documentation

## 2020-04-03 DIAGNOSIS — R7303 Prediabetes: Secondary | ICD-10-CM

## 2020-04-03 DIAGNOSIS — E119 Type 2 diabetes mellitus without complications: Secondary | ICD-10-CM

## 2020-04-03 DIAGNOSIS — M5412 Radiculopathy, cervical region: Secondary | ICD-10-CM

## 2020-04-03 DIAGNOSIS — Z23 Encounter for immunization: Secondary | ICD-10-CM

## 2020-04-03 DIAGNOSIS — M5010 Cervical disc disorder with radiculopathy, unspecified cervical region: Secondary | ICD-10-CM | POA: Insufficient documentation

## 2020-04-03 HISTORY — DX: Encounter for immunization: Z23

## 2020-04-03 HISTORY — DX: Cervical disc disorder at C5-C6 level with radiculopathy: M50.122

## 2020-04-03 LAB — POCT GLYCOSYLATED HEMOGLOBIN (HGB A1C): HbA1c, POC (controlled diabetic range): 7 % (ref 0.0–7.0)

## 2020-04-03 LAB — GLUCOSE, POCT (MANUAL RESULT ENTRY): POC Glucose: 99 mg/dl (ref 70–99)

## 2020-04-03 NOTE — Progress Notes (Signed)
She is interested in DM f/u and flu vaccine.   She does not check home CBGs. Appetite and fluid intake are normal.   Past Medical History:  Diagnosis Date  . Anemia   . Arthritis   . Asthma   . Carpal tunnel syndrome   . Dyspnea   . Endometriosis   . History of blood transfusion 1999   w/vaginal delivery  . History of bronchitis    "I've stayed in hospital 2 X w/bronchitis"  . Migraine   . Pneumonia   . PONV (postoperative nausea and vomiting)   . Pre-diabetes 10/03/2019   A1C 6.4    Social History   Socioeconomic History  . Marital status: Married    Spouse name: Not on file  . Number of children: 3  . Years of education: Not on file  . Highest education level: 12th grade  Occupational History  . Not on file  Tobacco Use  . Smoking status: Never Smoker  . Smokeless tobacco: Never Used  Vaping Use  . Vaping Use: Never used  Substance and Sexual Activity  . Alcohol use: No  . Drug use: No  . Sexual activity: Yes    Birth control/protection: Surgical  Other Topics Concern  . Not on file  Social History Narrative  . Not on file   Social Determinants of Health   Financial Resource Strain:   . Difficulty of Paying Living Expenses: Not on file  Food Insecurity: Food Insecurity Present  . Worried About Charity fundraiser in the Last Year: Sometimes true  . Ran Out of Food in the Last Year: Sometimes true  Transportation Needs: No Transportation Needs  . Lack of Transportation (Medical): No  . Lack of Transportation (Non-Medical): No  Physical Activity:   . Days of Exercise per Week: Not on file  . Minutes of Exercise per Session: Not on file  Stress:   . Feeling of Stress : Not on file  Social Connections:   . Frequency of Communication with Friends and Family: Not on file  . Frequency of Social Gatherings with Friends and Family: Not on file  . Attends Religious Services: Not on file  . Active Member of Clubs or Organizations: Not on file  . Attends English as a second language teacher Meetings: Not on file  . Marital Status: Not on file  Intimate Partner Violence:   . Fear of Current or Ex-Partner: Not on file  . Emotionally Abused: Not on file  . Physically Abused: Not on file  . Sexually Abused: Not on file    Past Surgical History:  Procedure Laterality Date  . ABDOMINAL HYSTERECTOMY  10/17/2019  . APPENDECTOMY    . CARPAL TUNNEL RELEASE Right 07/19/2019   Procedure: RIGHT CARPAL TUNNEL RELEASE;  Surgeon: Leandrew Koyanagi, MD;  Location: [facility];  Service: Orthopedics;  Laterality: Right;  . CARPAL TUNNEL RELEASE Left 09/13/2019   Procedure: LEFT CARPAL TUNNEL RELEASE;  Surgeon: Leandrew Koyanagi, MD;  Location: [facility];  Service: Orthopedics;  Laterality: Left;  Bier block  . CESAREAN SECTION  1997; 2008  . CHOLECYSTECTOMY  2011  . TONSILLECTOMY    . tonsilletomy    . TUBAL LIGATION  04/2007  . VAGINAL HYSTERECTOMY Bilateral 10/17/2019   Procedure: HYSTERECTOMY VAGINAL WITH SALPINGECTOMY;  Surgeon: Chancy Milroy, MD;  Location: [place];  Service: Gynecology;  Laterality: Bilateral;    Family History  Problem Relation Age of Onset  . Diabetes Mother   .  Osteoarthritis Mother   . Hypertension Mother   . Hyperlipidemia Mother   . Arthritis Mother     No Known Allergies  Current Outpatient Medications on File Prior to Visit  Medication Sig Dispense Refill  . diclofenac (VOLTAREN) 75 MG EC tablet Take 1 tablet (75 mg total) by mouth 2 (two) times daily as needed. 60 tablet 0  . traMADol (ULTRAM) 50 MG tablet Take 1 tablet (50 mg total) by mouth 3 (three) times daily as needed. 30 tablet 0  . acetaminophen (TYLENOL) 325 MG tablet Take 650 mg by mouth every 6 (six) hours as needed.    Marland Kitchen albuterol (VENTOLIN HFA) 108 (90 Base) MCG/ACT inhaler Inhale 2 puffs into the lungs in the morning and at bedtime. (Patient not taking: Reported on 04/03/2020) 18 g 2  . metFORMIN (GLUCOPHAGE) 500 MG tablet Take 1 tablet (500 mg  total) by mouth 2 (two) times daily with a meal. 180 tablet 1  . Multiple Vitamin (MULTIVITAMIN WITH MINERALS) TABS tablet Take 1 tablet by mouth daily in the afternoon.     No current facility-administered medications on file prior to visit.     patient denies chest pain, shortness of breath, orthopnea. Denies lower extremity edema, abdominal pain, change in appetite, change in bowel movements. Patient denies rashes, musculoskeletal complaints. No other specific complaints in a complete review of systems.   BP 114/66   Pulse 78   Resp 16   Wt 239 lb 3.2 oz (108.5 kg)   LMP 09/20/2019 (Exact Date)   SpO2 100%   BMI 44.58 kg/m   Well-developed well-nourished female in no acute distress. HEENT exam atraumatic, normocephalic, extraocular muscles are intact. Neck is supple. No jugular venous distention no thyromegaly. Chest clear to auscultation without increased work of breathing. Cardiac exam S1 and S2 are regular. Abdominal exam overweight.  active bowel sounds, soft, nontender. Extremities no edema. Neurologic exam she is alert without any motor sensory deficits. Gait is normal.  Pre-diabetes She is markedly overweight and she understands need to lose weight (communication through interpreter).  a1c is 7 which is diagnostic of DM (i'll change the problem from prediabetes)  Continue metformin F/u in 4 months.  At that time recheck lipids and check urine microalbuin   She will get flu immunization today

## 2020-04-03 NOTE — Patient Instructions (Signed)
Influenza Virus Vaccine injection (Fluarix) Qu es este medicamento? La VACUNA ANTIGRIPAL ayuda a disminuir el riesgo de contraer la influenza, tambin conocida como la gripe. La vacuna solo ayuda a protegerle contra algunas cepas de influenza. Esta vacuna no ayuda a reducir Catering manager de contraer influenza pandmica H1N1. Este medicamento puede ser utilizado para otros usos; si tiene alguna pregunta consulte con su proveedor de atencin mdica o con su farmacutico. MARCAS COMUNES: Fluarix, Fluzone Qu le debo informar a mi profesional de la salud antes de tomar este medicamento? Necesita saber si usted presenta alguno de los siguientes problemas o situaciones:  trastorno de sangrado como hemofilia  fiebre o infeccin  sndrome de Guillain-Barre u otros problemas neurolgicos  problemas del sistema inmunolgico  infeccin por el virus de la inmunodeficiencia humana (VIH) o SIDA  niveles bajos de plaquetas en la sangre  esclerosis mltiple  una reaccin IT consultant o inusual a las vacunas antigripales, a los huevos, protenas de pollo, al ltex, a la gentamicina, a otros medicamentos, alimentos, colorantes o conservantes  si est embarazada o buscando quedar embarazada  si est amamantando a un beb Cmo debo [employer]? Esta vacuna se administra mediante inyeccin por va intramuscular. Lo administra un profesional de [employer]	[place]. Recibir una copia de informacin escrita sobre la vacuna antes de cada vacuna. Asegrese de leer este folleto cada vez cuidadosamente. Este folleto puede cambiar con frecuencia. Hable con su pediatra para informarse acerca del uso de este medicamento en nios. Puede requerir atencin especial. Sobredosis: Pngase en contacto inmediatamente con un centro toxicolgico o una sala de urgencia si usted cree que haya tomado demasiado medicamento. ATENCIN: [employer] es solo para usted. No comparta este medicamento con nadie. Qu sucede si me  olvido de una dosis? No se aplica en este caso. Qu puede interactuar con este medicamento?  quimioterapia o radioterapia  medicamentos que suprimen el sistema inmunolgico, tales como etanercept, anakinra, infliximab y adalimumab  medicamentos que tratan o previenen cogulos sanguneos, como warfarina  fenitona  medicamentos esteroideos, como la prednisona o la cortisona  teofilina  vacunas Puede ser que esta lista no menciona todas las posibles interacciones. Informe a su profesional de [employer]	[place] de [employer] productos a base de hierbas, medicamentos de [place] o suplementos nutritivos que est tomando. Si usted fuma, consume bebidas alcohlicas o si utiliza drogas ilegales, indqueselo tambin a su profesional de [employer]	[place]. Algunas sustancias pueden interactuar con su medicamento. A qu debo estar atento al usar [employer]? Informe a su mdico o a Barrister's clerk de la [employer] todos los efectos secundarios que persistan despus de 3 das. Llame a su proveedor de atencin mdica si se presentan sntomas inusuales dentro de las 6 semanas posteriores a la vacunacin. Es posible que todava pueda contraer la gripe, pero la enfermedad no ser tan fuerte como normalmente. No puede contraer la gripe de esta vacuna. La vacuna antigripal no le protege contra resfros u otras enfermedades que pueden causar [place]. Debe vacunarse cada ao. Qu efectos secundarios puedo tener al [employer] este medicamento? Efectos secundarios que debe informar a su mdico o a Barrister's clerk de la salud tan pronto como sea posible:  Chief of Staff como erupcin cutnea, picazn o urticarias, hinchazn de la cara, labios o lengua Efectos secundarios que, por lo general, no requieren atencin mdica (debe informarlos a su mdico o a su profesional de la salud si persisten o si son molestos):  fiebre  dolor de cabeza  molestias y dolores musculares  dolor, sensibilidad, enrojecimiento o  hinchazn en el lugar de la inyeccin  cansancio o debilidad Puede ser que esta lista no menciona todos los posibles efectos secundarios. Comunquese a su mdico por asesoramiento mdico [employer]. Usted puede informar los efectos secundarios a la FDA por telfono al 1-800-FDA-1088. Dnde debo guardar mi medicina? Esta vacuna se administra solamente en clnicas, farmacias, consultorio mdico u otro consultorio de un profesional de la salud y no Sports coach en su domicilio. ATENCIN: Este folleto es un resumen. Puede ser que no cubra toda la posible informacin. Si usted tiene preguntas acerca de esta medicina, consulte con su mdico, su farmacutico o su profesional de Technical sales engineer.  2020 Elsevier/Gold Standard (2009-11-26 15:31:40)

## 2020-04-03 NOTE — Assessment & Plan Note (Signed)
She is markedly overweight and she understands need to lose weight (communication through interpreter).  a1c is 7 which is diagnostic of DM (i'll change the problem from prediabetes)  Continue metformin F/u in 4 months.  At that time recheck lipids and check urine microalbuin

## 2020-04-03 NOTE — Progress Notes (Signed)
Patient presents for vaccination against influenza per orders of Dr. Judd Gaudier.  Consent given. Counseling provided. No contraindications exists. Vaccine administered without incident.   Benard Halsted, PharmD, [place] [phone]

## 2020-04-06 ENCOUNTER — Ambulatory Visit: Payer: BC Managed Care – PPO

## 2020-04-08 ENCOUNTER — Ambulatory Visit: Payer: BC Managed Care – PPO | Admitting: Physical Therapy

## 2020-04-09 ENCOUNTER — Other Ambulatory Visit: Payer: Self-pay | Admitting: Family Medicine

## 2020-04-09 DIAGNOSIS — R7303 Prediabetes: Secondary | ICD-10-CM

## 2020-04-10 ENCOUNTER — Other Ambulatory Visit: Payer: BC Managed Care – PPO

## 2020-04-22 ENCOUNTER — Telehealth: Payer: Self-pay

## 2020-04-22 ENCOUNTER — Telehealth: Payer: Self-pay | Admitting: Family Medicine

## 2020-04-22 NOTE — Telephone Encounter (Signed)
Patient is calling for an appt

## 2020-04-22 NOTE — Telephone Encounter (Signed)
Please schedule patient with appointment with any provider.

## 2020-04-26 ENCOUNTER — Other Ambulatory Visit: Payer: Self-pay | Admitting: Physician Assistant

## 2020-05-07 ENCOUNTER — Encounter: Payer: BC Managed Care – PPO | Admitting: Family

## 2020-05-07 NOTE — Progress Notes (Signed)
Patient did not show for appointment.   

## 2020-05-10 ENCOUNTER — Encounter: Payer: Self-pay | Admitting: General Practice

## 2020-06-17 DIAGNOSIS — R030 Elevated blood-pressure reading, without diagnosis of hypertension: Secondary | ICD-10-CM | POA: Insufficient documentation

## 2020-06-17 HISTORY — DX: Elevated blood-pressure reading, without diagnosis of hypertension: R03.0

## 2020-08-05 ENCOUNTER — Other Ambulatory Visit: Payer: Self-pay | Admitting: Family Medicine

## 2020-08-05 ENCOUNTER — Ambulatory Visit: Payer: BC Managed Care – PPO | Attending: Family Medicine | Admitting: Family Medicine

## 2020-08-05 ENCOUNTER — Other Ambulatory Visit: Payer: Self-pay

## 2020-08-05 ENCOUNTER — Encounter: Payer: Self-pay | Admitting: Family Medicine

## 2020-08-05 DIAGNOSIS — Z1159 Encounter for screening for other viral diseases: Secondary | ICD-10-CM | POA: Diagnosis not present

## 2020-08-05 DIAGNOSIS — E119 Type 2 diabetes mellitus without complications: Secondary | ICD-10-CM | POA: Diagnosis not present

## 2020-08-05 MED ORDER — TRUE METRIX BLOOD GLUCOSE TEST VI STRP: ORAL_STRIP | 12 refills | Status: DC

## 2020-08-05 MED ORDER — METFORMIN HCL 500 MG PO TABS: 500.0000 mg | ORAL_TABLET | Freq: Two times a day (BID) | ORAL | 6 refills | Status: DC

## 2020-08-05 MED ORDER — ATORVASTATIN CALCIUM 20 MG PO TABS: 20.0000 mg | ORAL_TABLET | Freq: Every day | ORAL | 6 refills | Status: DC

## 2020-08-05 MED ORDER — TRUE METRIX METER DEVI: 1.0000 | Freq: Three times a day (TID) | 0 refills | Status: DC

## 2020-08-05 MED ORDER — TRUEPLUS LANCETS 28G MISC: 1.0000 | Freq: Three times a day (TID) | 12 refills | Status: DC

## 2020-08-05 NOTE — Progress Notes (Signed)
Virtual Visit via Telephone Note  I connected with Nithya Meriweather, on 08/05/2020 at 4:07 PM by telephone due to the COVID-19 pandemic and verified that I am speaking with the correct person using two identifiers.   Consent: I discussed the limitations, risks, security and privacy concerns of performing an evaluation and management service by telephone and the availability of in person appointments. I also discussed with the patient that there may be a patient responsible charge related to this service. The patient expressed understanding and agreed to proceed.   Location of Patient: Home  Location of Provider: Clinic   Persons participating in Telemedicine visit: Sunya Humbarger [facility] Pacifi Interpreter Dr. Margarita Rana     History of Present Illness: 45 year old female with a history of type 2 diabetes mellitus (A1c 7.0) States she ran out of her Metformin and has been without it  for 4 months was previously prediabetic with A1c of 6.4 but her A1c was elevated at her last visit in 03/2020 and she was commenced on Metformin.  Complains of headaches, dry mouth sedation for the last 4-5 months ever since she ran out of Metformin. She has not been able to check her sugars as she has no Glucometer. She denies chest pain, dyspnea or other additional symptoms.  Past Medical History:  Diagnosis Date  . Anemia   . Arthritis   . Asthma   . Carpal tunnel syndrome   . Dyspnea   . Endometriosis   . History of blood transfusion 1999   w/vaginal delivery  . History of bronchitis    "I've stayed in hospital 2 X w/bronchitis"  . Migraine   . Pneumonia   . PONV (postoperative nausea and vomiting)   . Pre-diabetes 10/03/2019   A1C 6.4   No Known Allergies  Current Outpatient Medications on File Prior to Visit  Medication Sig Dispense Refill  . acetaminophen (TYLENOL) 325 MG tablet Take 650 mg by mouth every 6 (six) hours as needed.    Marland Kitchen  albuterol (VENTOLIN HFA) 108 (90 Base) MCG/ACT inhaler Inhale 2 puffs into the lungs in the morning and at bedtime. (Patient not taking: Reported on 04/03/2020) 18 g 2  . diclofenac (VOLTAREN) 75 MG EC tablet Take 1 tablet (75 mg total) by mouth 2 (two) times daily as needed. 60 tablet 0  . metFORMIN (GLUCOPHAGE) 500 MG tablet TAKE 1 TABLET BY MOUTH TWICE A DAY WITH A MEAL 180 tablet 1  . Multiple Vitamin (MULTIVITAMIN WITH MINERALS) TABS tablet Take 1 tablet by mouth daily in the afternoon.    . traMADol (ULTRAM) 50 MG tablet Take 1 tablet (50 mg total) by mouth 3 (three) times daily as needed. 30 tablet 0   No current facility-administered medications on file prior to visit.    ROS: See HPI  Observations/Objective: Awake, alert, oriented x3 Not in acute distress Speaks in full sentences Normal mood  Lab Results  Component Value Date   HGBA1C 7.0 04/03/2020    Assessment and Plan: 1. Type 2 diabetes mellitus without complication, without long-term current use of insulin (HCC) Previously controlled with A1c of 7.0 Given symptoms of dry mouth I am concerned that she might be hypoglycemic We will check A1c and if indicated increase Metformin dose accordingly Prescription for glucometer has been sent to her pharmacy Placed on low-dose statin Counseled on Diabetic diet, my plate method, 027 minutes of moderate intensity exercise/week Blood sugar logs with fasting goals of 80-120 mg/dl, random of less than 180 and  in the event of sugars less than 60 mg/dl or greater than 400 mg/dl encouraged to notify the clinic. Advised on the need for annual eye exams, annual foot exams, Pneumonia vaccine. - Hemoglobin A1c; Future - Microalbumin / creatinine urine ratio; Future - metFORMIN (GLUCOPHAGE) 500 MG tablet; Take 1 tablet (500 mg total) by mouth 2 (two) times daily with a meal.  Dispense: 60 tablet; Refill: 6 - CMP14+EGFR; Future  2. Need for hepatitis C screening test - HCV RNA quant  rflx ultra or genotyp([facility]); Future   Follow Up Instructions: Labs tomorrow; office visit in 3 months   I discussed the assessment and treatment plan with the patient. The patient was provided an opportunity to ask questions and all were answered. The patient agreed with the plan and demonstrated an understanding of the instructions.   The patient was advised to call back or seek an in-person evaluation if the symptoms worsen or if the condition fails to improve as anticipated.     I provided 12 minutes total of non-face-to-face time during this encounter.   Charlott Rakes, MD, FAAFP. [facility] and [place], [place]   08/05/2020, 4:07 PM

## 2020-08-06 ENCOUNTER — Other Ambulatory Visit: Payer: Self-pay | Admitting: Family Medicine

## 2020-08-06 ENCOUNTER — Other Ambulatory Visit: Payer: Self-pay | Admitting: Pharmacist

## 2020-08-06 MED ORDER — MICROLET LANCETS MISC: 2 refills | Status: DC

## 2020-08-07 ENCOUNTER — Other Ambulatory Visit: Payer: Self-pay

## 2020-08-07 ENCOUNTER — Ambulatory Visit: Payer: BC Managed Care – PPO | Attending: Family Medicine

## 2020-08-07 DIAGNOSIS — Z1159 Encounter for screening for other viral diseases: Secondary | ICD-10-CM

## 2020-08-07 DIAGNOSIS — E119 Type 2 diabetes mellitus without complications: Secondary | ICD-10-CM

## 2020-08-08 LAB — CMP14+EGFR
ALT: 120 IU/L — ABNORMAL HIGH (ref 0–32)
AST: 98 IU/L — ABNORMAL HIGH (ref 0–40)
Albumin/Globulin Ratio: 1.4 (ref 1.2–2.2)
Albumin: 4.6 g/dL (ref 3.8–4.8)
Alkaline Phosphatase: 138 IU/L — ABNORMAL HIGH (ref 44–121)
BUN/Creatinine Ratio: 22 (ref 9–23)
BUN: 14 mg/dL (ref 6–24)
Bilirubin Total: 0.5 mg/dL (ref 0.0–1.2)
CO2: 22 mmol/L (ref 20–29)
Calcium: 9.5 mg/dL (ref 8.7–10.2)
Chloride: 103 mmol/L (ref 96–106)
Creatinine, Ser: 0.64 mg/dL (ref 0.57–1.00)
Globulin, Total: 3.3 g/dL (ref 1.5–4.5)
Glucose: 126 mg/dL — ABNORMAL HIGH (ref 65–99)
Potassium: 4.4 mmol/L (ref 3.5–5.2)
Sodium: 141 mmol/L (ref 134–144)
Total Protein: 7.9 g/dL (ref 6.0–8.5)
eGFR: 112 mL/min/{1.73_m2} (ref >59)

## 2020-08-08 LAB — MICROALBUMIN / CREATININE URINE RATIO
Creatinine, Urine: 261.3 mg/dL
Microalb/Creat Ratio: 2 mg/g creat (ref 0–29)
Microalbumin, Urine: 6.2 ug/mL

## 2020-08-08 LAB — HCV RNA QUANT RFLX ULTRA OR GENOTYP: HCV Quant Baseline: NOT DETECTED IU/mL

## 2020-08-08 LAB — HEMOGLOBIN A1C
Est. average glucose Bld gHb Est-mCnc: 180 mg/dL
Hgb A1c MFr Bld: 7.9 % — ABNORMAL HIGH (ref 4.8–5.6)

## 2020-08-09 ENCOUNTER — Other Ambulatory Visit: Payer: Self-pay | Admitting: Family Medicine

## 2020-08-09 DIAGNOSIS — R7989 Other specified abnormal findings of blood chemistry: Secondary | ICD-10-CM

## 2020-08-11 LAB — HEPATITIS PANEL, ACUTE
Hep A IgM: NEGATIVE
Hep B C IgM: NEGATIVE
Hep C Virus Ab: <0.1 s/co ratio (ref 0.0–0.9)
Hepatitis B Surface Ag: NEGATIVE

## 2020-08-11 LAB — SPECIMEN STATUS REPORT

## 2020-08-12 ENCOUNTER — Telehealth: Payer: Self-pay

## 2020-08-12 NOTE — Telephone Encounter (Signed)
-----   Message from Charlott Rakes, MD sent at 08/09/2020  8:45 AM EST ----- Please have lab add on hepatitis panel. She still has elevated liver enzymes and I would like her to hold off on atorvastatin. Please schedule liver US for her. Thanks

## 2020-08-12 NOTE — Telephone Encounter (Signed)
Patient name and DOB has been verified Patient was informed of lab results. Patient had no questions.  Pt notified of Ultrasound appointment.

## 2020-08-13 ENCOUNTER — Encounter: Payer: Self-pay | Admitting: Orthopaedic Surgery

## 2020-08-13 ENCOUNTER — Other Ambulatory Visit: Payer: Self-pay

## 2020-08-13 ENCOUNTER — Ambulatory Visit (INDEPENDENT_AMBULATORY_CARE_PROVIDER_SITE_OTHER): Payer: BC Managed Care – PPO | Admitting: Orthopaedic Surgery

## 2020-08-13 ENCOUNTER — Ambulatory Visit: Payer: Self-pay

## 2020-08-13 DIAGNOSIS — M79642 Pain in left hand: Secondary | ICD-10-CM | POA: Diagnosis not present

## 2020-08-13 DIAGNOSIS — M79641 Pain in right hand: Secondary | ICD-10-CM

## 2020-08-13 MED ORDER — METHYLPREDNISOLONE ACETATE 40 MG/ML IJ SUSP
40.0000 mg | INTRAMUSCULAR | Status: AC | PRN
  Administered 2020-08-13: 40 mg

## 2020-08-13 MED ORDER — BUPIVACAINE HCL 0.5 % IJ SOLN
1.0000 mL | INTRAMUSCULAR | Status: AC | PRN
  Administered 2020-08-13: 1 mL

## 2020-08-13 MED ORDER — LIDOCAINE HCL 1 % IJ SOLN
1.0000 mL | INTRAMUSCULAR | Status: AC | PRN
  Administered 2020-08-13: 1 mL

## 2020-08-13 NOTE — Progress Notes (Signed)
Office Visit Note   Patient: Alexandra Norris           Date of Birth: 1976-03-06           MRN: [id number] Visit Date: 08/13/2020              Requested by: Charlott Rakes, MD [place],  [place] [address] PCP: Charlott Rakes, MD   Assessment & Plan: Visit Diagnoses:  1. Pain in both hands     Plan: Impression is left hand pain and numbness reminiscent of previous carpal tunnel syndrome however she is also reporting neck pain and radicular symptoms.  She is status post left carpal tunnel release.  Based on treatment options she would like to try a cortisone injection today.  She will wear the brace at all times until she notices relief in her symptoms.  She was educated on the importance of good diabetic control.  If she does not notice any improvement from this injection she will make an appointment to see Dr. Kathyrn Sheriff.  Follow-up as needed.  Follow-Up Instructions: Return if symptoms worsen or fail to improve.   Orders:  Orders Placed This Encounter  Procedures  . Hand/UE Inj  . XR Hand Complete Left  . XR Hand Complete Right   No orders of the defined types were placed in this encounter.     Procedures: Hand/UE Inj: L carpal tunnel for carpal tunnel syndrome on 08/13/2020 3:24 PM Details: 25 G needle Medications: 1 mL lidocaine 1 %; 1 mL bupivacaine 0.5 %; 40 mg methylPREDNISolone acetate 40 MG/ML Outcome: tolerated well, no immediate complications Patient was prepped and draped in the usual sterile fashion.       Clinical Data: No additional findings.   Subjective: Chief Complaint  Patient presents with  . Right Hand - Pain  . Left Hand - Pain    Pantera returns today for mainly left hand symptoms.  She feels like she has had some worsening of her carpal tunnel symptoms in the left hand.  She did very well from the right carpal tunnel release.  She feels some pain and numbness in the index and long finger of the left hand.   She does also report some neck pain she is status post cervical spine surgery by Dr.   Review of Systems  Constitutional: Negative.   HENT: Negative.   Eyes: Negative.   Respiratory: Negative.   Cardiovascular: Negative.   Endocrine: Negative.   Musculoskeletal: Negative.   Neurological: Negative.   Hematological: Negative.   Psychiatric/Behavioral: Negative.   All other systems reviewed and are negative.    Objective: Vital Signs: LMP 09/20/2019 (Exact Date)   Physical Exam Vitals and nursing note reviewed.  Constitutional:      Appearance: She is well-developed and well-nourished.  Pulmonary:     Effort: Pulmonary effort is normal.  Skin:    General: Skin is warm.     Capillary Refill: Capillary refill takes less than 2 seconds.  Neurological:     Mental Status: She is alert and oriented to person, place, and time.  Psychiatric:        Mood and Affect: Mood and affect normal.        Behavior: Behavior normal.        Thought Content: Thought content normal.        Judgment: Judgment normal.     Ortho Exam Left hand shows a fully healed surgical scar.  Negative carpal tunnel compressive signs.  No motor or sensory changes or deficits.  Negative Spurling sign. Specialty Comments:  No specialty comments available.  Imaging: XR Hand Complete Left  Result Date: 08/13/2020 Mild basal joint arthritis.  No structural abnormalities.  XR Hand Complete Right  Result Date: 08/13/2020 Mild basal joint arthritis.  No structural abnormalities    PMFS History: Patient Active Problem List   Diagnosis Date Noted  . Influenza vaccine needed 04/03/2020  . S/P carpal tunnel release 08/30/2019  . Right carpal tunnel syndrome 07/11/2019  . Left carpal tunnel syndrome 07/11/2019  . Arthritis   . Anemia   . Seasonal allergies 10/08/2016  . DM2 (diabetes mellitus, type 2) ([place]) 10/21/2015  . Bilateral chronic knee pain 09/25/2015  . GASTROESOPHAGEAL REFLUX DISEASE  08/14/2009  . DEPRESSION, SITUATIONAL, PROLONGED 02/05/2009  . OBESITY 05/30/2007  . Moderate persistent asthma with acute exacerbation 12/06/2006   Past Medical History:  Diagnosis Date  . Anemia   . Arthritis   . Asthma   . Carpal tunnel syndrome   . Dyspnea   . Endometriosis   . History of blood transfusion 1999   w/vaginal delivery  . History of bronchitis    "I've stayed in hospital 2 X w/bronchitis"  . Migraine   . Pneumonia   . PONV (postoperative nausea and vomiting)   . Pre-diabetes 10/03/2019   A1C 6.4    Family History  Problem Relation Age of Onset  . Diabetes Mother   . Osteoarthritis Mother   . Hypertension Mother   . Hyperlipidemia Mother   . Arthritis Mother     Past Surgical History:  Procedure Laterality Date  . ABDOMINAL HYSTERECTOMY  10/17/2019  . APPENDECTOMY    . CARPAL TUNNEL RELEASE Right 07/19/2019   Procedure: RIGHT CARPAL TUNNEL RELEASE;  Surgeon: Leandrew Koyanagi, MD;  Location: [place];  Service: Orthopedics;  Laterality: Right;  . CARPAL TUNNEL RELEASE Left 09/13/2019   Procedure: LEFT CARPAL TUNNEL RELEASE;  Surgeon: Leandrew Koyanagi, MD;  Location: [place];  Service: Orthopedics;  Laterality: Left;  Bier block  . CESAREAN SECTION  1997; 2008  . CHOLECYSTECTOMY  2011  . TONSILLECTOMY    . tonsilletomy    . TUBAL LIGATION  04/2007  . VAGINAL HYSTERECTOMY Bilateral 10/17/2019   Procedure: HYSTERECTOMY VAGINAL WITH SALPINGECTOMY;  Surgeon: Chancy Milroy, MD;  Location: [place];  Service: Gynecology;  Laterality: Bilateral;   Social History   Occupational History  . Not on file  Tobacco Use  . Smoking status: Never Smoker  . Smokeless tobacco: Never Used  Vaping Use  . Vaping Use: Never used  Substance and Sexual Activity  . Alcohol use: No  . Drug use: No  . Sexual activity: Yes    Birth control/protection: Surgical

## 2020-08-16 ENCOUNTER — Ambulatory Visit (HOSPITAL_COMMUNITY)
Admission: RE | Admit: 2020-08-16 | Discharge: 2020-08-16 | Disposition: A | Discharge status: Home / Self Care | Payer: BC Managed Care – PPO | Source: Ambulatory Visit | Attending: Family Medicine | Admitting: Family Medicine

## 2020-08-16 ENCOUNTER — Other Ambulatory Visit: Payer: Self-pay

## 2020-08-16 DIAGNOSIS — R7989 Other specified abnormal findings of blood chemistry: Secondary | ICD-10-CM | POA: Diagnosis not present

## 2020-08-16 IMAGING — US US ABDOMEN LIMITED RUQ/ASCITES
1 series · 14 of 25 positions shown · non-contrast
Comparison: CT abdomen pelvis [DATE]

CLINICAL DATA: Elevated LFTs.

EXAM:
ULTRASOUND ABDOMEN LIMITED RIGHT UPPER QUADRANT

[Series 1: us abdomen limited ruq (liver/gb) · 14 of 38 slices shown]
[im 1/38]
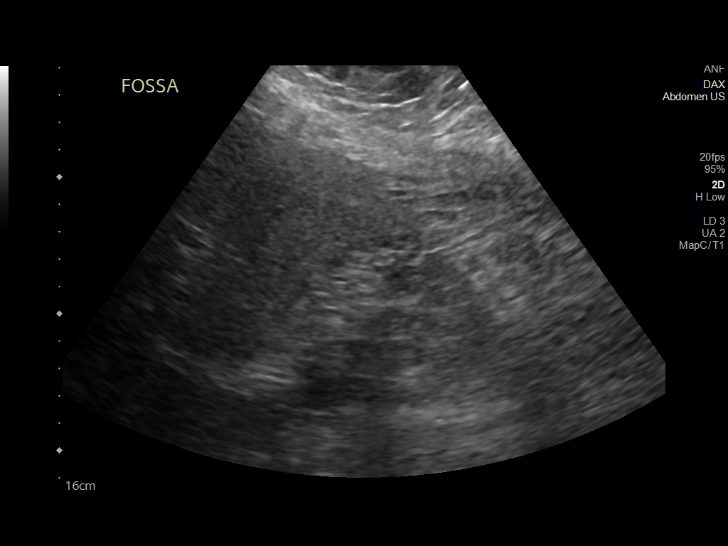
[im 4/38]
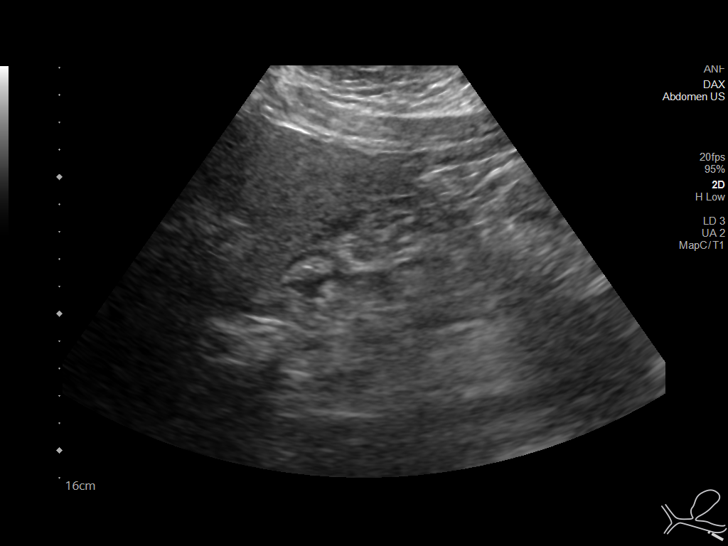
[im 7/38]
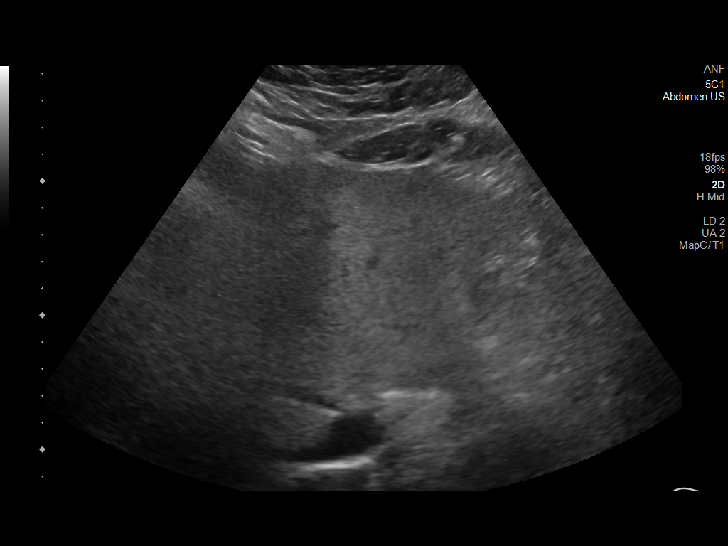
[im 10/38]
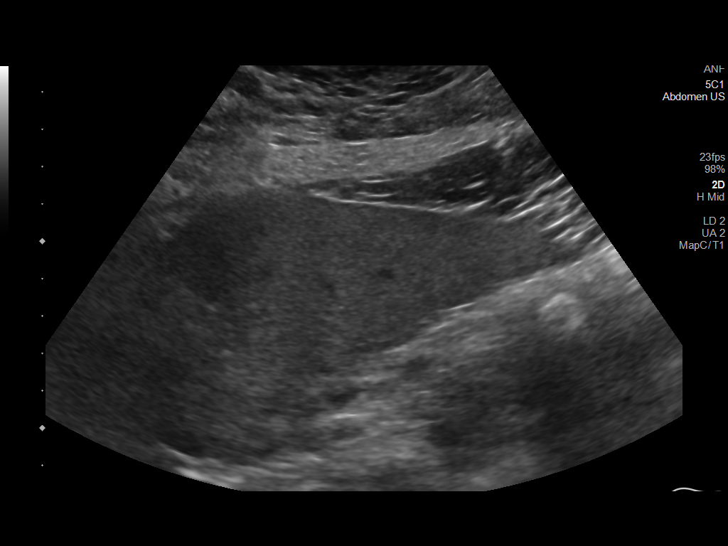
[im 13/38]
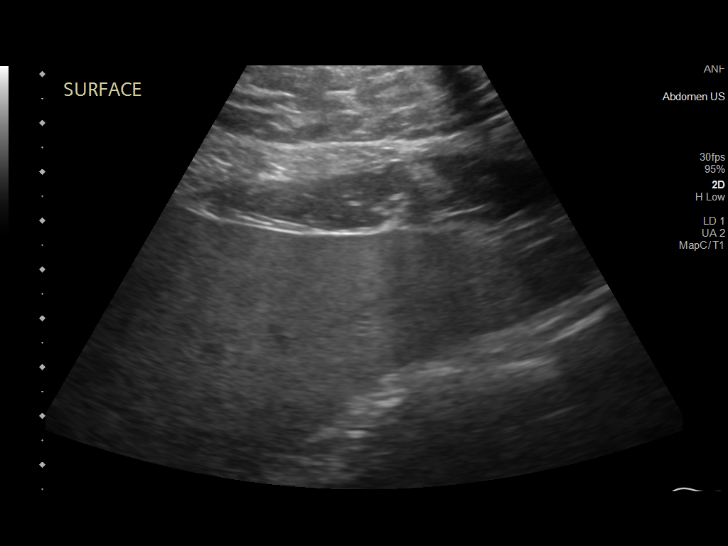
[im 14/38]
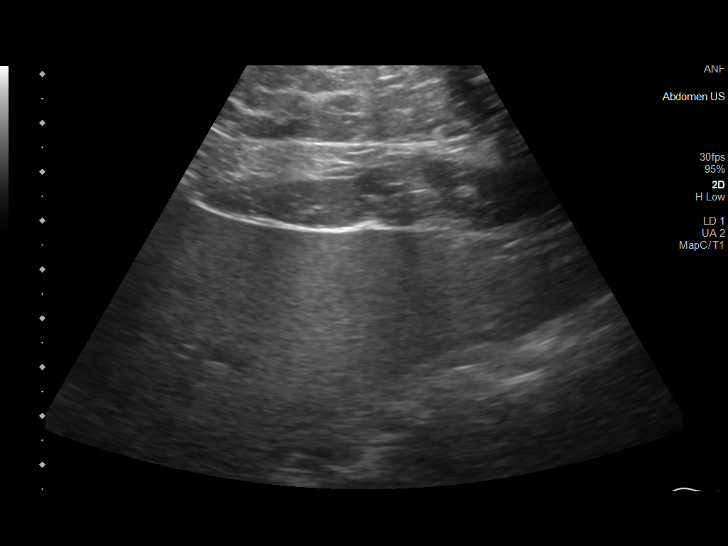
[im 17/38]
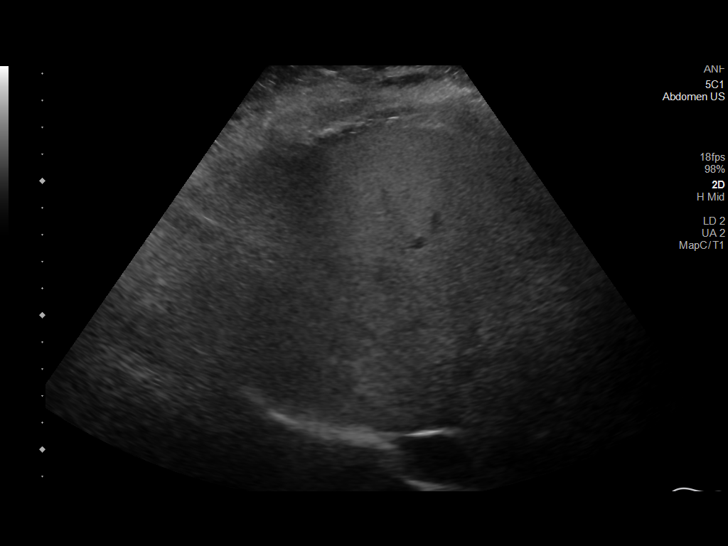
[im 21/38]
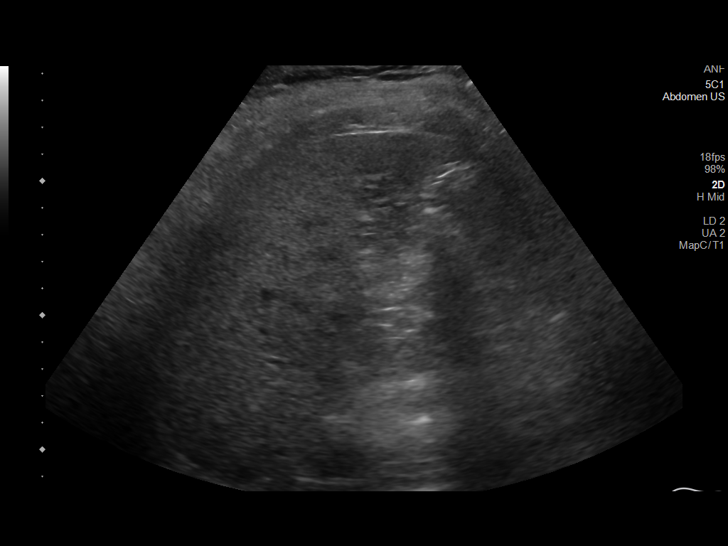
[im 24/38]
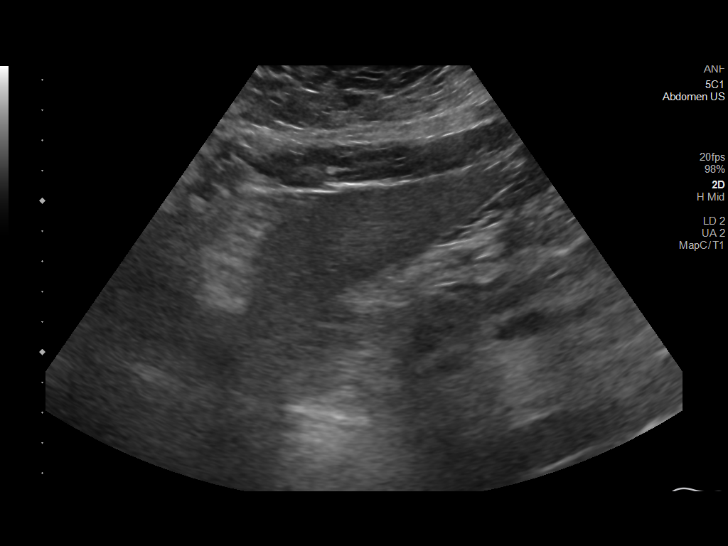
[im 25/38]
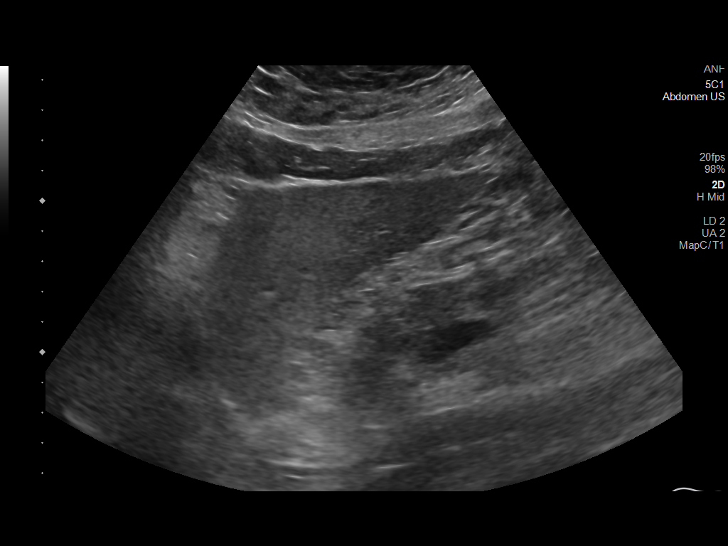
[im 28/38]
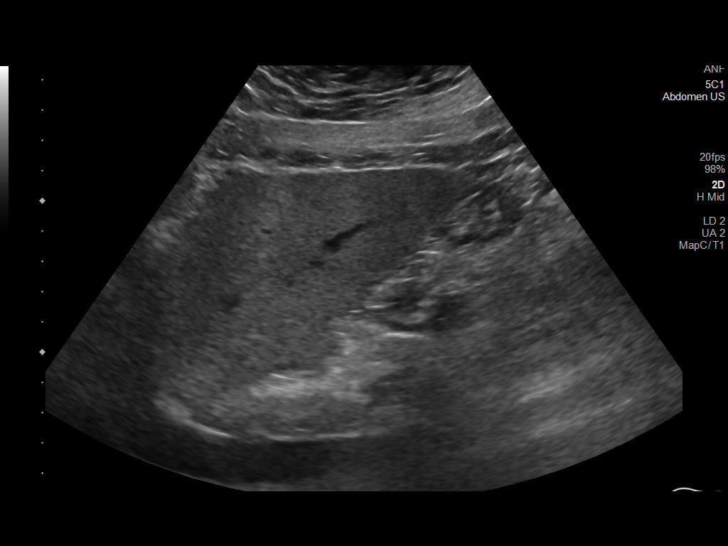
[im 31/38]
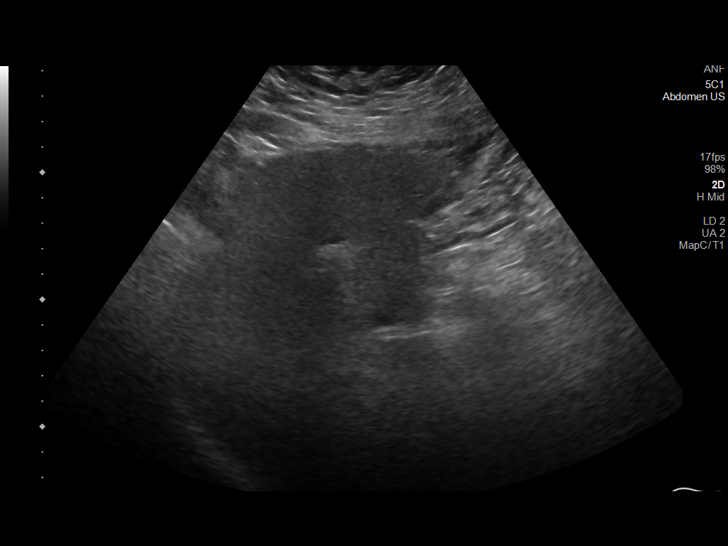
[im 34/38]
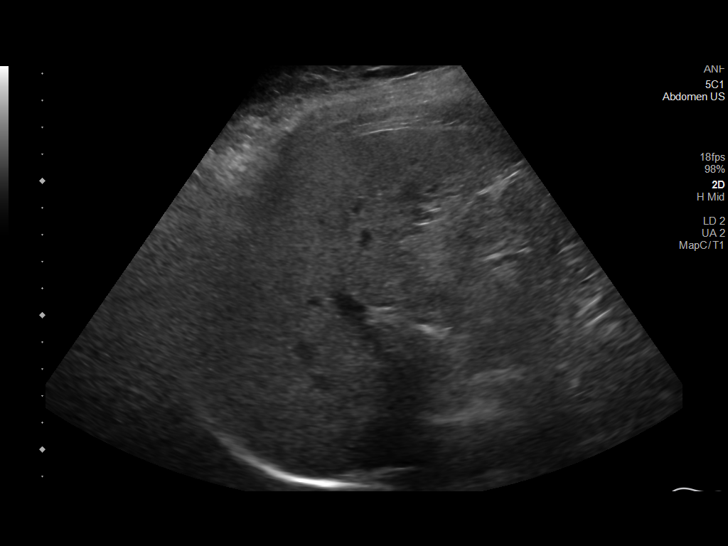
[im 38/38]
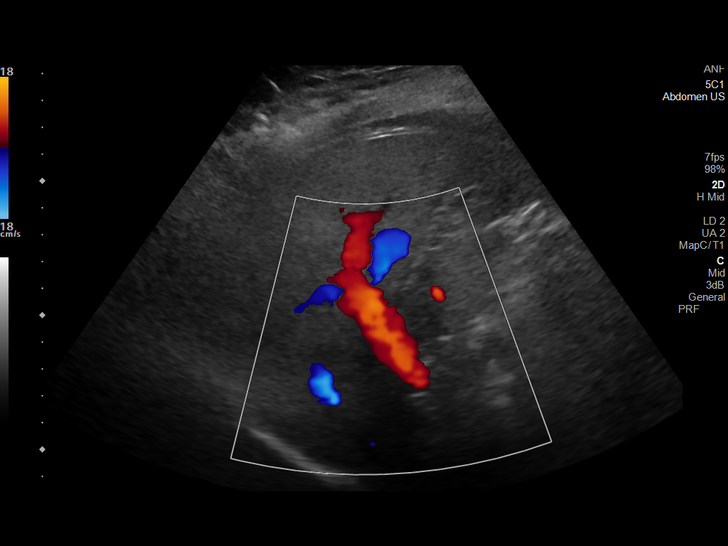

[14 of 25 positions shown; findings below may reference images not displayed]

FINDINGS: Gallbladder:

Surgically absent

Common bile duct:

Diameter: 2 mm

Liver:

No focal lesion identified. Diffusely increased parenchymal
echogenicity. Portal vein is patent on color Doppler imaging with
normal direction of blood flow towards the liver.

Other: None.
IMPRESSION: 1. The echogenicity of the liver is increased. This is a nonspecific
finding but is most commonly seen with fatty infiltration of the
liver. There are no obvious focal liver lesions.
2. Prior cholecystectomy.

## 2020-08-22 ENCOUNTER — Telehealth: Payer: Self-pay | Admitting: Family Medicine

## 2020-08-22 NOTE — Telephone Encounter (Signed)
Pt was called and informed of [place] results.

## 2020-08-22 NOTE — Progress Notes (Signed)
Pt has been informed of results

## 2020-08-22 NOTE — Telephone Encounter (Signed)
Copied from [place] [id number]. Topic: Quick Communication - Other Results (Clinic Use ONLY) >> Aug 22, 2020 10:12 AM Alexandra Norris wrote: pt had liver US on 3/11 and would like someone to call her and explain her results, as she does not understand.

## 2020-08-23 NOTE — Progress Notes (Signed)
Pt has been informed of [place] results.

## 2020-09-09 ENCOUNTER — Encounter (HOSPITAL_BASED_OUTPATIENT_CLINIC_OR_DEPARTMENT_OTHER): Payer: Self-pay | Admitting: Nurse Practitioner

## 2020-09-09 ENCOUNTER — Other Ambulatory Visit: Payer: Self-pay

## 2020-09-09 ENCOUNTER — Ambulatory Visit (INDEPENDENT_AMBULATORY_CARE_PROVIDER_SITE_OTHER): Payer: BC Managed Care – PPO | Admitting: Nurse Practitioner

## 2020-09-09 VITALS — BP 126/73 | HR 83 | Ht 61.0 in | Wt 235.6 lb

## 2020-09-09 DIAGNOSIS — E119 Type 2 diabetes mellitus without complications: Secondary | ICD-10-CM | POA: Diagnosis not present

## 2020-09-09 DIAGNOSIS — K760 Fatty (change of) liver, not elsewhere classified: Secondary | ICD-10-CM | POA: Diagnosis not present

## 2020-09-09 DIAGNOSIS — E1165 Type 2 diabetes mellitus with hyperglycemia: Secondary | ICD-10-CM | POA: Diagnosis not present

## 2020-09-09 DIAGNOSIS — Z6841 Body Mass Index (BMI) 40.0 and over, adult: Secondary | ICD-10-CM

## 2020-09-09 DIAGNOSIS — E66813 Obesity, class 3: Secondary | ICD-10-CM

## 2020-09-09 DIAGNOSIS — J4541 Moderate persistent asthma with (acute) exacerbation: Secondary | ICD-10-CM

## 2020-09-09 DIAGNOSIS — Z7689 Persons encountering health services in other specified circumstances: Secondary | ICD-10-CM

## 2020-09-09 MED ORDER — METFORMIN HCL 500 MG PO TABS
ORAL_TABLET | ORAL | 6 refills | Status: DC
  Filled 2020-09-09: qty 60, 30d supply, fill #0

## 2020-09-09 MED ORDER — AMOXICILLIN-POT CLAVULANATE 875-125 MG PO TABS
1.0000 | ORAL_TABLET | Freq: Two times a day (BID) | ORAL | 0 refills | Status: DC
  Filled 2020-09-09: qty 10, 5d supply, fill #0

## 2020-09-09 MED ORDER — ALBUTEROL SULFATE HFA 108 (90 BASE) MCG/ACT IN AERS
2.0000 | INHALATION_SPRAY | Freq: Two times a day (BID) | RESPIRATORY_TRACT | 2 refills | Status: DC
  Filled 2020-09-09: qty 18, 25d supply, fill #0

## 2020-09-09 NOTE — Assessment & Plan Note (Addendum)
Recent elevated LFTs from previous provider with subsequent ultrasound determine the presence of fatty liver. Discussed this diagnosis with the patient and recommendations for changes to help reduce risks and improve overall health. She expressed understanding of information provided. She has already began making changes to her diet and exercise regimen which will certainly help with this. We will follow closely. She does have a history of hyperlipidemia however I have advised her to hold her atorvastatin at this time until we can monitor her liver functions again to ensure that the dosage is appropriate for her current liver function. Explained to the patient not through the medication away as she may need to restart this in the future.  She expressed understanding. Follow-up in 3 months-we will plan for repeat labs of hemoglobin A1c and CMP.

## 2020-09-09 NOTE — Assessment & Plan Note (Signed)
Most recent hemoglobin A1c is elevated to 7.9%. She has been taking Metformin 500 mg twice a day but reports that she has just recently started on this medication. We will plan to increase the Metformin to 500 mg every morning and 1000 mg every evening with meals. Patient provided with a significant amount of education in native language on diabetes, diet, weight, and medication management. Utilization of the interpreter today to help reinforce understanding of current condition. We will plan to follow-up in approximately 3 months and repeat labs at that time and also monitor liver function as this was elevated on most recent readings.

## 2020-09-09 NOTE — Assessment & Plan Note (Signed)
Current flare of asthma symptoms due to sinusitis. Treatment with Augmentin twice daily for 5 days. Refill provided on albuterol inhaler. Patient instructed to contact the office if symptoms worsen or fail to improve.

## 2020-09-09 NOTE — Patient Instructions (Addendum)
Fue Health and safety inspector. Si tiene [place], comunquese con la oficina o enve un mensaje a travs de MyChart y estaremos encantados de ayudarle.  Por favor, haga un seguimiento en 3 meses para volver a controlar su diabetes. Revisaremos los laboratorios cuando regrese.  Le he enviado metformina a la farmacia. Me gustara que tomara 1 tableta por la maana y 2 tableta por la noche con las comidas.  Los alimentos con alto contenido de Location manager son las papas, la pasta, el arroz, los refrescos, los jugos, los dulces y los panes. Asegrese de reducir la cantidad de estos alimentos para Contractor nivel de azcar en la sangre bajo control.  Me gustara que revise su nivel de azcar en la sangre todas las maanas antes de comer y que anote el nmero para que podamos revisarlo cuando venga en 3 meses.  Me gustara que comenzara a caminar [employer] al menos 20 minutos para ayudar con el nivel de azcar en la sangre y [place].  Se recomienda que se haga un examen de la vista todos los aos para asegurarse de que sus ojos no estn daados por la diabetes. Puedo colocar una referencia para usted, si lo necesita.   La diabetes mellitus y las normas bsicas de atencin mdica Diabetes Mellitus and Standards of [place] con diabetes (diabetes mellitus) y controlarla puede ser complicado. Su tratamiento de la diabetes puede ser administrado por un equipo de profesionales de la salud, que incluye:  Un mdico especializado en diabetes (endocrinlogo). Tambin podra tener visitas con un enfermero especializado o auxiliar mdico.  Enfermeras.  Un nutricionista certificado.  Un especialista en atencin y educacin sobre la diabetes certificado.  Un especialista en actividad fsica.  Un farmacutico.  E[place].  Un especialista en pies (podlogo).  Un proveedor de Airline pilot.  Un mdico de cabecera.  Un profesional de salud mental. Cmo controlar la  diabetes Puede hacer muchas cosas para controlar con xito la diabetes. Sus mdicos seguirn pautas para ayudarlo a Personal assistant mejor calidad de atencin. Estas son las pautas generales para su plan de control de la diabetes. Los mdicos tambin podrn darle instrucciones ms especficas. Exmenes fsicos Tras ser diagnosticado con diabetes, y cada ao luego de esto, su mdico le preguntar acerca de sus antecedentes mdicos y familiares. Le harn un examen fsico, que puede incluir:  Medicin de Psychologist, counselling, peso e ndice de masa corporal [facility]).  Control de la presin arterial. Esto se realiza en cada visita mdica de rutina. La presin arterial deseada puede variar en funcin de las enfermedades, la edad y otros factores personales.  Un examen de la tiroides.  Un examen de la piel.  Un examen para deteccin de dao nervioso (neuropata perifrica). Esto puede incluir controlar el pulso de las piernas y los pies, y el nivel de sensibilidad en las manos y pies.  Un examen de pies para inspeccionar la estructura y la piel de los pies, lo que incluye controlar si hay cortes, moretones, enrojecimiento, ampollas, llagas u otros problemas.  Exmenes de deteccin de problemas en los vasos sanguneos (vasculares). Esto puede incluir Corporate treasurer en las piernas y los pies, y Customer service manager. Anlisis de [employer] de tratamiento y [place] necesidades personales, es posible que se le realicen las siguientes pruebas:  Hemoglobina A1C (HbA1C). Este anlisis proporciona informacin sobre el control de la glucemia (glucosa en la sangre) durante los ltimos 2 o 22meses. [place]  para ajustar el plan de tratamiento, de ser necesario. Este anlisis se har: ? Al menos 2 veces al ao, si cumple los objetivos del tratamiento. ? [employer], si no cumple los objetivos del tratamiento o si sus objetivos [place].  Anlisis de lpidos, lo que incluye colesterol total, colesterol  LDL y HDL, y niveles de triglicridos. ? En relacin al LDL, el objetivo es tener menos de 100mg /dl (5,5 mmol/l). Si tiene alto riesgo de complicaciones, el objetivo es tener menos de 70 mg/dl (3.9 mmol/l). ? En relacin al HDL, el objetivo es tener 40 mg/dl (2.2 mmol/l) o ms para los hombres y 50 mg/dl (2.8 mmol/l) o ms para las mujeres. Un nivel de colesterol HDL de 60 mg/dl (3.63mmol/l) o superior da una cierta proteccin contra la enfermedad cardaca. ? En relacin a los triglicridos, el objetivo es tener menos de 150 mg/dl (8,3 mmol/l).  Pruebas funcionales hepticas.  Pruebas de la funcin renal.  Pruebas de la funcin tiroidea.   Exmenes dentales y [employer]  Visite al [employer] Corporation veces por ao.  Si tiene diabetes tipo1, el mdico puede recomendarle que se haga un examen ocular en un plazo de 5aos despus del diagnstico y, luego, Ardelia Mems vez al ao despus del Tree surgeon. ? Para los nios que tienen diabetes tipo1, el pediatra puede recomendar un examen ocular cuando el nio tiene 11aos o ms y ha tenido diabetes durante 3 a 5 aos. Despus del primer examen, el nio debe hacerse un examen ocular una vez al ao.  Si tiene diabetes tipo2, el mdico puede recomendarle que se haga un examen ocular ni bien haya sido diagnosticado y, luego, cada 1 o 2aos despus del Tree surgeon.   Vacunas  Se recomienda aplicar de forma anual la vacuna contra la gripe (influenza) a todas las personas de 67meses en adelante. Esto es muy importante si tiene diabetes.  La vacuna contra la neumona (antineumoccica) est recomendada para todas las personas de 2aos en adelante que tengan diabetes. Si es mayor de 65aos, puede recibir la vacuna antineumoccica como una serie de dos inyecciones [place].  Se recomienda administrar la vacuna contra la hepatitisB en adultos poco despus de que hayan recibido el diagnstico de diabetes. Los adultos y los nios que tienen diabetes deben recibir  todas las vacunas de acuerdo con las recomendaciones especficas segn la edad de los Centros para el Control y la Prevencin de Probation officer for Disease Control and Prevention, CDC). Salud mental y emocional Se recomienda Optometrist controles para Hydrographic surveyor sntomas de trastornos de Youth worker, ansiedad y depresin en el momento del diagnstico, y posteriormente segn sea necesario. Si los controles revelan la presencia de sntomas, es posible que deba someterse a ms evaluaciones. Es posible que trabaje con un profesional de la salud mental. Siga estas instrucciones en su casa: Plan de tratamiento Usted medir sus niveles de glucemia y quizs pueda administrarse insulina. Su plan de tratamiento ser revisado en cada visita mdica. Usted y Nature conservation officer lo siguiente:  Cmo est recibiendo los medicamentos, incluso la [place].  Cualquier efecto secundario que tenga.  Sus objetivos deseados con relacin al nivel de glucemia.  Con qu frecuencia se mide el nivel de glucemia.  Hbitos del estilo de vida, como nivel de [place] y el consumo de tabaco, alcohol y drogas. Educacin El mdico evaluar qu tan bien se est midiendo los niveles de glucemia y si est recibiendo la insulina y los medicamentos de [place]. El mdico puede derivarlo  a:  Un especialista en atencin y educacin sobre la diabetes certificado, para Air cabin crew la diabetes durante toda la vida, comenzando desde el diagnstico.  Un nutricionista matriculado que puede crear y Health visitor un plan de nutricin personal.  Un especialista en ejercicios que puede analizar el nivel de actividad y un plan de ejercicios. Instrucciones generales  Use los medicamentos de venta libre y los recetados solamente como se lo haya indicado el mdico.  Cumpla con todas las visitas de seguimiento. Esto es importante. Dnde buscar apoyo Hay muchas redes de 54 para la diabetes, entre ellas:  American Diabetes Association  (ADA) (Asociacin Estadounidense de la Diabetes): diabetes.org  Defeat Diabetes Foundation (Fundacin Defeat Diabetes): defeatdiabetes.org Dnde buscar ms informacin  American Diabetes Association (ADA) (Asociacin Estadounidense de la Diabetes): www.diabetes.org  Association of Diabetes Care & Education Specialists (ADCES) (Asociacin de Especialistas en Atencin y Educacin sobre la Diabetes): diabeteseducator.org  International Diabetes Federation (IDF) (Federacin Internacional de Diabetes): https://www.munoz-bell.org/ Resumen  Tratar la diabetes (diabetes mellitus) puede ser complicado. Su tratamiento de la diabetes puede ser administrado por un equipo de profesionales de Technical sales engineer.  Los mdicos siguen pautas para ayudarlo a Personal assistant mejor calidad de atencin.  Usted debe realizarse exmenes fsicos, anlisis de [place] y controles de la presin arterial, aplicarse vacunas y someterse a estudios de control con regularidad. Mantngase al da sobre cmo controlar la diabetes.  Sus mdicos tambin podrn darle instrucciones ms especficas basndose en su salud. Esta informacin no tiene Marine scientist el consejo del mdico. Asegrese de hacerle al mdico cualquier pregunta que tenga. Document Revised: 01/04/2020 Document Reviewed: 01/04/2020 Elsevier Patient Education  [place].   Diabetes mellitus y actividad fsica Diabetes Mellitus and Exercise Hacer actividad fsica habitualmente es importante para el estado de salud general, en especial para las personas que tienen diabetes mellitus. La actividad fsica no solo se reduce a Pharmacist, hospital. Aporta muchos beneficios para la salud, como aumento de la fuerza muscular y la densidad sea, y reduccin de las grasas corporales y Dealer. Esto mejora el estado fsico, la flexibilidad y la resistencia, y todo ello redunda en un mejor estado de salud general. Cules son los beneficios de la actividad fsica si tengo diabetes? La actividad  fsica tiene muchos beneficios para las personas con diabetes. Incluyen los siguientes:  Ayuda a Sports coach y Engineer, agricultural en la sangre (glucosa) bajo control.  Mejora la respuesta del cuerpo a la hormona insulina porque optimiza la sensibilidad a la insulina.  Reduce la cantidad de insulina que el cuerpo necesita.  Reduce el riesgo de tener una enfermedad cardaca porque: ? [employer] de colesterol "malo" y triglicridos. ? Aumenta los niveles de colesterol "bueno". ? [place] la presin arterial. ? Disminuye la glucemia. Cul es mi plan de Kandace Blitz? El mdico o un educador para la diabetes certificado pueden ayudarlo a Engineer, petroleum del tipo y de la frecuencia de actividad fsica adecuado para usted. Esto se denomina "plan de actividad". Asegrese de lo siguiente:  Haga por lo menos 167minutos semanales de ejercicios de intensidad media o alta. Los ejercicios pueden incluir caminar a paso rpido, andar en bicicleta o hacer gimnasia aerbica en el agua.  Haga ejercicios de elongacin y de fortalecimiento, como yoga o levantamiento de pesas, por lo menos 2veces por semana.  Reparta la actividad en al menos 3das de la semana.  Haga algn tipo de actividad fsica cada da. ? No deje pasar ms de 2das seguidos sin hacer  algn tipo de actividad fsica. ? No permanezca inactivo durante ms de 29minutos seguidos. Tmese descansos frecuentes para caminar o estirarse.  Elija ejercicios o actividades que disfrute. Establezca objetivos realistas.  Comience lentamente y aumente de [place] gradual la intensidad de la actividad fsica con el correr del [place].   Cmo controlo la diabetes durante la actividad fsica? Controlar su nivel de glucemia  Contrlese la glucemia antes y despus de ejercitarse. Si el nivel de glucemia es: ? 240mg /dl (13.71mmol/l) o ms antes de comenzar a hacer actividad fsica, controle la orina para detectar la presencia de cetonas. Estas son  sustancias qumicas producidas por el hgado. Si tiene [employer] orina, no haga ejercicio hasta que la glucemia se normalice. ? 100 mg/dl (5.6 mmol/l) o menos, tome una colacin que [employer] 15 y 65 gramos de carbohidratos. Controle la glucemia 15 minutos despus de la colacin para asegurarse de que el nivel de glucosa est por encima de 100 mg/dl (5.6 mmol/l) antes de comenzar a hacer actividad fsica.  Conozca los sntomas de la glucemia baja (hipoglucemia) y aprenda cmo tratarla. El riesgo de tener hipoglucemia [place] durante y despus de hacer actividad fsica. Siga estos consejos y las instrucciones del mdico  Tenga una colacin de carbohidratos que sea de accin rpida antes, durante y despus de ejercitarse, a fin de evitar o tratar la hipoglucemia.  Evite inyectarse insulina en las zonas del cuerpo que ejercitar. Por ejemplo, evite inyectarse insulina en: ? Los brazos, cuando est por jugar al tenis. ? Las piernas, cuando est por irse a trotar.  Lleve registros de sus hbitos de actividad fsica. Esto puede ayudarlos a usted y al mdico a Tax adviser de control de la diabetes segn sea necesario. Escriba los siguientes datos: ? Los alimentos que consume antes y despus de Field seismologist actividad fsica. ? Los niveles de glucemia antes y despus de hacer ejercicios. ? El tipo y cantidad de [place] fsica que Musician.  Trabaje con el mdico cuando comience un nuevo tipo de actividad fsica o ejercicio. Es posible que el mdico deba hacer lo siguiente: ? Asegurarse de que la actividad sea segura para usted. ? Ajustar la insulina, los otros medicamentos y los alimentos que usted consume.  Beba mucha agua mientras hace ejercicio. Esto previene la prdida de agua (deshidratacin) y los problemas causados por mucho calor en el cuerpo (golpe de calor).   Dnde buscar ms informacin  American Diabetes Association (Asociacin Estadounidense de la Diabetes):  www.diabetes.org Resumen  Hacer actividad fsica habitualmente es importante para el estado de salud general, en especial para las personas que tienen diabetes mellitus.  Hacer actividad fsica tiene muchos beneficios para [employer]	[place]. Aumenta la fuerza muscular y la densidad sea, y reduce las grasas corporales y Dealer. Tambin disminuye y controla la glucemia.  El mdico o un educador para la diabetes certificado puede ayudarlo a Paediatric nurse un plan de actividades respecto del tipo y de la frecuencia de actividad fsica adecuados para usted.  Consulte al mdico para asegurarse de que cualquier actividad nueva sea segura para usted. Tambin trabaje con el mdico para ajustar la [place], los otros medicamentos y los alimentos que consume. Esta informacin no tiene Marine scientist el consejo del mdico. Asegrese de hacerle al mdico cualquier pregunta que tenga. Document Revised: 05/26/2019 Document Reviewed: 05/26/2019 Elsevier Patient Education  2021 [place] del hgado graso Fatty Liver Disease  El hgado transforma los alimentos en energa, elimina las sustancias txicas de  la sangre, fabrica protenas importantes y absorbe las vitaminas necesarias de los alimentos. La enfermedad del hgado graso ocurre cuando se acumula demasiada grasa en las clulas del hgado. La enfermedad del hgado graso tambin se llama esteatosis heptica. En muchos casos, la enfermedad del hgado graso no provoca sntomas ni problemas. Con frecuencia, se diagnostica cuando se realizan estudios por otros motivos. Sin embargo, con el [place], el hgado graso puede provocar una inflamacin que posiblemente cause problemas hepticos ms graves, como la fibrosis heptica (cirrosis) o insuficiencia heptica. El hgado graso se asocia con la resistencia a la insulina, el aumento de la grasa corporal, la presin arterial alta (hipertensin) y el colesterol elevado. Estas son caractersticas del sndrome  metablico y [place] el riesgo de accidente cerebrovascular, diabetes y enfermedad cardaca. Cules son las causas? Esta afeccin puede estar causada por componentes del sndrome metablico:  Obesidad.  Resistencia a la insulina.  Colesterol alto. Otras causas:  Consumo excesivo de alcohol.  Nutricin deficiente.  Sndrome de [place].  Embarazo.  Determinados medicamentos.  Txicos.  Algunas infecciones virales. Qu incrementa el riesgo? Es ms probable que tengan esta afeccin las personas que:  Consumen alcohol en exceso.  Tienen sobrepeso.  Tienen diabetes.  Tienen hepatitis.  Tienen un nivel alto de triglicridos.  Estn embarazadas. Cules son los signos o sntomas? Con frecuencia, la enfermedad del hgado graso no provoca sntomas. Si se desarrollan sntomas, estos pueden incluir:  Fatiga y debilidad.  Prdida de peso.  Confusin.  Nuseas, vmitos o dolor abdominal.  Color amarillo en la piel y en la zona blanca de los ojos (ictericia).  Picazn en la piel. Cmo se diagnostica? Este trastorno puede diagnosticarse mediante:  Un examen fsico y los antecedentes mdicos.  Anlisis de [place].  Estudios de diagnstico por imgenes, como ecografa, exploracin por tomografa computarizada (TC) o Health visitor (RM).  Biopsia de hgado. Se extrae una pequea muestra de tejido del hgado usando [place]. La muestra se examina en el microscopio. Cmo se trata? Con frecuencia, la enfermedad del hgado graso es causada por otras afecciones. El tratamiento para el hgado graso puede incluir medicamentos y cambios en el estilo de vida para controlar enfermedades como:  Alcoholismo.  Colesterol alto.  Diabetes.  Sobrepeso u obesidad. Siga estas instrucciones en su casa:  No beba alcohol. Si tiene problemas para dejar de beber, consulte al mdico cmo puede dejar de beber de forma segura con la ayuda de medicamentos o un programa con  supervisin. Esto es importante para evitar que la afeccin empeore.  Siga una dieta saludable como se lo haya indicado el mdico. Consulte al mdico sobre trabajar con un nutricionista para Animal nutritionist de alimentacin.  Haga ejercicio regularmente. Esto puede ayudarlo a [employer], y a Academic librarian y la diabetes. Hable con el mdico sobre qu actividades son mejores para usted y Audiological scientist de [place].  Use los medicamentos de venta libre y los recetados solamente como se lo haya indicado el mdico.  Cumpla con todas las visitas de seguimiento. Esto es importante.   Comunquese con un mdico si:  Tiene dificultad para controlar lo siguiente: ? Nivel de Dispensing optician. Esto es muy importante si tiene diabetes. ? Colesterol. ? El consumo de alcohol. Solicite ayuda de inmediato si:  Siente dolor abdominal.  Tiene ictericia.  Tiene nuseas y vomita.  Vomita sangre o una sustancia similar al poso del caf.  Las heces son negras, alquitranadas o sanguinolentas. Resumen  La enfermedad  del hgado graso se desarrolla cuando se acumula demasiada grasa en las clulas del hgado.  Con frecuencia, la enfermedad del hgado no causa sntomas ni problemas. Sin embargo, con Physiological scientist, el hgado graso puede provocar una inflamacin que puede causar problemas hepticos ms graves, como fibrosis heptica (cirrosis).  Es ms probable que desarrolle esta afeccin si consume alcohol en exceso, est embarazada, tiene sobrepeso, diabetes, hepatitis o altos niveles de triglicridos o de colesterol.  Comunquese con el mdico si tiene problemas para controlar su azcar en la sangre, el colesterol o el consumo de alcohol. Esta informacin no tiene Marine scientist el consejo del mdico. Asegrese de hacerle al mdico cualquier pregunta que tenga. Document Revised: 04/02/2020 Document Reviewed: 04/02/2020 Elsevier Patient Education  [place].   Diabetes  mellitus y nutricin, en adultos Diabetes Mellitus and Nutrition, Adult Si sufre de diabetes, o diabetes mellitus, es muy importante tener hbitos alimenticios saludables debido a que sus niveles de Designer, television/film set sangre (glucosa) se ven afectados en gran medida por lo que come y bebe. [place] alimentos saludables en las cantidades correctas, aproximadamente a la misma hora todos los [place], [place] ayudar a:  Aeronautical engineer glucemia.  Disminuir el riesgo de sufrir una enfermedad cardaca.  Mejorar la presin arterial.  Science writer o mantener un peso saludable. Qu puede afectar mi plan de alimentacin? Todas las personas que sufren de diabetes son diferentes y cada una tiene necesidades diferentes en cuanto a un plan de alimentacin. El mdico puede recomendarle que trabaje con un nutricionista para elaborar el mejor plan para usted. Su plan de alimentacin puede variar segn factores como:  Las caloras que necesita.  Los medicamentos que toma.  Su peso.  Sus niveles de glucemia, presin arterial y colesterol.  Su nivel de [place].  Otras afecciones que tenga, como enfermedades cardacas o renales. Cmo me afectan los carbohidratos? Los carbohidratos, o hidratos de carbono, afectan su nivel de glucemia ms que cualquier otro tipo de alimento. La ingesta de carbohidratos naturalmente aumenta la cantidad de [employer]. El recuento de carbohidratos es un mtodo destinado a Catering manager un registro de la cantidad de carbohidratos que se consumen. El recuento de carbohidratos es importante para Theatre manager la glucemia a un nivel saludable, especialmente si utiliza insulina o toma determinados medicamentos por va oral para la diabetes. Es importante conocer la cantidad de carbohidratos que se pueden ingerir en cada comida sin correr Engineer, manufacturing. Esto es Psychologist, forensic. Su nutricionista puede ayudarlo a calcular la cantidad de carbohidratos que debe ingerir en cada comida y en cada  refrigerio. Cmo me afecta el alcohol? El alcohol puede provocar disminuciones sbitas de la glucemia (hipoglucemia), especialmente si utiliza insulina o toma determinados medicamentos por va oral para la diabetes. La hipoglucemia es una afeccin potencialmente mortal. Los sntomas de la hipoglucemia, como somnolencia, mareos y confusin, son similares a los sntomas de haber consumido demasiado alcohol.  No beba alcohol si: ? Su mdico le indica no hacerlo. ? Est embarazada, puede estar embarazada o est tratando de quedar embarazada.  Si bebe alcohol: ? No beba con el estmago vaco. ? Limite la cantidad que bebe:  De 0 a 1 medida por da para las mujeres.  De 0 a 2 medidas por da para los hombres. ? Est atento a la cantidad de alcohol que hay en las bebidas que toma. En los [place], una medida equivale a una botella de cerveza de 12oz (365ml), un vaso de vino de  5oz (1103ml) o un vaso de una bebida alcohlica de alta graduacin de 1oz (27ml). ? Mantngase hidratado bebiendo agua, refrescos dietticos o t helado sin azcar.  Tenga en cuenta que los refrescos comunes, los jugos y otras bebida para Optician, dispensing pueden contener mucha azcar y se deben contar como carbohidratos. Consejos para seguir Photographer las etiquetas de los alimentos  Comience por leer el tamao de la porcin en la "Informacin nutricional" en las etiquetas de los alimentos envasados y las bebidas. La cantidad de caloras, carbohidratos, grasas y otros nutrientes mencionados en la etiqueta se basan en una porcin del alimento. Muchos alimentos contienen ms de una porcin por envase.  Verifique la cantidad total de gramos (g) de carbohidratos totales en una porcin. Puede calcular la cantidad de porciones de carbohidratos al dividir el total de carbohidratos por 15. Por ejemplo, si un alimento tiene un total de 30g de carbohidratos totales por porcin, equivale a 2 porciones de  carbohidratos.  Verifique la cantidad de gramos (g) de grasas saturadas y grasas trans de una porcin. Escoja alimentos que no contengan estas grasas o que su contenido de estas sea [place].  Verifique la cantidad de miligramos (mg) de sal (sodio) en una porcin. La [employer] de las personas deben limitar la ingesta de sodio total a menos de 2300mg  por Training and development officer.  Siempre consulte la informacin nutricional de los alimentos etiquetados como "con bajo contenido de grasa" o "sin grasa". Estos alimentos pueden tener un mayor contenido de Location manager agregada o carbohidratos refinados, y deben evitarse.  Hable con su nutricionista para identificar sus objetivos diarios en cuanto a los nutrientes mencionados en la etiqueta. Al ir de compras  Evite comprar alimentos procesados, enlatados o precocidos. Estos alimentos tienden a Special educational needs teacher mayor cantidad de [place], sodio y azcar agregada.  Compre en la zona exterior de la tienda de comestibles. Esta es la zona donde se encuentran con mayor frecuencia las frutas y las verduras frescas, los cereales a granel, las carnes frescas y los productos lcteos frescos. Al cocinar  Utilice mtodos de coccin a baja temperatura, como hornear, en lugar de mtodos de coccin a alta temperatura, como frer en abundante aceite.  Cocine con aceites saludables, como el aceite de [place], canola o [place].  Evite cocinar con manteca, crema o carnes con alto contenido de grasa. Planificacin de las comidas  Coma las comidas y los refrigerios regularmente, preferentemente a la misma hora todos [place]. Evite pasar largos perodos de tiempo sin comer.  Consuma alimentos ricos en fibra, como frutas frescas, verduras, frijoles y cereales integrales. Consulte a su nutricionista sobre cuntas porciones de carbohidratos puede consumir en cada comida.  Consuma entre 4 y 6 onzas (entre 112 y 168g) de protenas magras por da, como carnes magras, pollo, pescado, huevos o tofu. Una onza (oz) de  protena magra equivale a: ? 1 onza (28g) de carne, pollo o pescado. ? 1huevo. ?  de taza (62 g) de tofu.  Coma algunos alimentos por da que contengan grasas saludables, como aguacates, frutos secos, semillas y pescado.   Qu alimentos debo comer? Lambert Mody Bayas. Manzanas. Naranjas. Duraznos. Damascos. Ciruelas. Uvas. Mango. Papaya. [place]. Kiwi. Cerezas. [place] Valeda Malm. [place]. Verduras de [employer], que incluyen col rizada, [place], hojas de [place] y de [place]. Remolachas. Coliflor. Repollo. Brcoli. Zanahorias. Judas verdes. Tomates. Pimientos. Cebollas. Pepinos. Coles de Bruselas. Granos Granos integrales, como panes, galletas, tortillas, cereales y pastas de salvado o integrales. Avena sin azcar. Quinua. Arroz integral o salvaje. Carnes y  otras protenas Mariscos. Carne de ave sin piel. Cortes magros de ave y carne de res. Tofu. Frutos secos. Semillas. Lcteos Productos lcteos sin grasa o con bajo contenido de [place], [place], yogur y [place]. Es posible que los productos que se enumeran ms [place] no constituyan una lista completa de los alimentos y las bebidas que puede tomar. Consulte a un nutricionista para obtener ms informacin. Qu alimentos debo evitar? Lambert Mody Frutas enlatadas al almbar. Verduras Verduras enlatadas. Verduras congeladas con mantequilla o salsa de crema. Granos Productos elaborados con [place] y [place], como panes, pastas, bocadillos y cereales. Evite todos los alimentos procesados. Carnes y otras protenas Cortes de carne con alto contenido de Lobbyist. Carne de ave con piel. Carnes empanizadas o fritas. Carne procesada. Evite las grasas saturadas. Lcteos Yogur, No Name enteros. Bebidas Bebidas azucaradas, como gaseosas o t helado. Es posible que los productos que se enumeran ms [place] no constituyan una lista completa de los alimentos y las bebidas que Nurse, adult. Consulte a un nutricionista para obtener ms  informacin. Preguntas para hacerle al mdico  Es necesario que me rena con Radio broadcast assistant en el cuidado de la diabetes?  Es necesario que me rena con un nutricionista?  A qu nmero puedo llamar si tengo preguntas?  Cules son los mejores momentos para controlar la glucemia? Dnde encontrar ms informacin:  Asociacin Estadounidense de la Diabetes (American Diabetes Association): diabetes.org  Academy of Nutrition and Dietetics (Academia de Nutricin y Information systems manager): www.eatright.[employer] of Diabetes and Digestive and Kidney Diseases ([place] la Diabetes y [place] y [place]): DesMoinesFuneral.dk  Association of Diabetes Care and Education Specialists (Asociacin de Especialistas en Atencin y Educacin sobre la Diabetes): www.diabeteseducator.org Resumen  Es importante tener hbitos alimenticios saludables debido a que sus niveles de Designer, television/film set sangre (glucosa) se ven afectados en gran medida por lo que come y bebe.  Un plan de alimentacin saludable lo ayudar a controlar la glucemia y Theatre manager un estilo de vida saludable.  El mdico puede recomendarle que trabaje con un nutricionista para elaborar el mejor plan para usted.  Tenga en cuenta que los carbohidratos (hidratos de carbono) y el alcohol tienen efectos inmediatos en sus niveles de glucemia. Es importante contar los carbohidratos que ingiere y consumir alcohol con prudencia. Esta informacin no tiene Marine scientist el consejo del mdico. Asegrese de hacerle al mdico cualquier pregunta que tenga. Document Revised: 06/29/2019 Document Reviewed: 06/29/2019 Elsevier Patient Education  2021 [employer].

## 2020-09-09 NOTE — Assessment & Plan Note (Signed)
BMI 44.52 at visit today. She has recently made significant changes to her diet and joined the gym which did help quite considerably in her efforts to lose weight and obtain better control over her chronic conditions. Encourage patient to continue with positive lifestyle changes. I am hopeful that increasing the Metformin will help with improved glucose control and with weight loss as well.  We will consider adding a GLP-1 future if current medication regimen is not effective in helping her diabetes and weight loss goals. Recommend follow-up in 3 months.

## 2020-09-09 NOTE — Assessment & Plan Note (Signed)
New patient presenting today to establish care in this office. Review of past medical, surgical, social, family history as well as social determinants of health. Interpreter utilized for entire visit including history and instructions. All instructions provided in native language. We will plan to follow-up in 3 months.

## 2020-09-09 NOTE — Progress Notes (Signed)
New Patient Office Visit  Subjective:  Patient ID: Alexandra Norris, female    DOB: 1975/06/20  Age: 45 y.o. MRN: [id number]  CC:  Chief Complaint  Patient presents with  . Establish Care  . Diabetes    HPI Alexandra Norris is a very pleasant Hispanic 45 year old female that presents today to establish care.  She would also like to discuss her diabetes management and recent results of an liver ultrasound that was performed by her previous healthcare provider.  She also has some concerns with cold symptoms that developed about a week ago.  She is Spanish-speaking primarily and is accompanied today by an interpreter provided by [employer], Christia Reading.  She [facility] by [facility] for her primary care needs up until recently.  Her most labs were [facility] approximately [facility] ago [facility]  At that time they did discover that she has new diabetes and elevated liver enzymes.  Subsequent ultrasound was performed which does show evidence of fatty liver.  She reports that she is married to her husband 83.  Together they have 3 sons.  She also has 3 grandchildren two girls and one boy. She is not currently working as she had surgery last November and was taken off of work at that time.  She reports that she has a follow-up with the surgeon next week and hopefully will be released to return to work. She reports that since her recent diagnosis of diabetes that she has been following a low carbohydrate low-fat diet she also joined the gym at [employer] and has been working out on a regular basis. She denies use of alcohol, nicotine, recreational drugs. She is currently sexually active with her husband only, she has no concerns with sexually transmitted diseases and no sexually transmitted disease history. She had hysterectomy approximately 1 year ago and denies any vaginal bleeding since that time. She denies changes to her  skin, bowels, or bladder habits.  She is currently taking Metformin 500 mg in the morning and in the evening, atorvastatin 20 mg daily, as needed albuterol, and as needed Tylenol.  She does have a blood glucose monitor at home and she is checking her blood sugars daily which she reports fasting levels are about 170 on average.  Her most recent hemoglobin A1c was 7.9%.  COLD SYMPTOMS She reports an increase in cough and wheezing as well as sore throat, rhinorrhea, frontal sinus pain and pressure, and watery eyes that started approximately 1 week ago.  She has not been taking anything for this.  She does report that she has used albuterol in the past but she is out and will need a refill on this medication.  She does not have any known fevers however she does report that she started to develop chills yesterday.  Past Medical History:  Diagnosis Date  . Anemia   . Arthritis   . Asthma   . Carpal tunnel syndrome   . Dyspnea   . Endometriosis   . History of blood transfusion 1999   w/vaginal delivery  . History of bronchitis    "I've stayed in hospital 2 X w/bronchitis"  . Migraine   . Pneumonia   . PONV (postoperative nausea and vomiting)   . Pre-diabetes 10/03/2019   A1C 6.4    Past Surgical History:  Procedure Laterality Date  . ABDOMINAL HYSTERECTOMY  10/17/2019  . APPENDECTOMY    . CARPAL TUNNEL RELEASE Right 07/19/2019  Procedure: RIGHT CARPAL TUNNEL RELEASE;  Surgeon: Leandrew Koyanagi, MD;  Location: [facility];  Service: Orthopedics;  Laterality: Right;  . CARPAL TUNNEL RELEASE Left 09/13/2019   Procedure: LEFT CARPAL TUNNEL RELEASE;  Surgeon: Leandrew Koyanagi, MD;  Location: [facility];  Service: Orthopedics;  Laterality: Left;  Bier block  . CESAREAN SECTION  1997; 2008  . CHOLECYSTECTOMY  2011  . TONSILLECTOMY    . tonsilletomy    . TUBAL LIGATION  04/2007  . VAGINAL HYSTERECTOMY Bilateral 10/17/2019   Procedure: HYSTERECTOMY VAGINAL WITH  SALPINGECTOMY;  Surgeon: Chancy Milroy, MD;  Location: [facility];  Service: Gynecology;  Laterality: Bilateral;    Family History  Problem Relation Age of Onset  . Diabetes Mother   . Osteoarthritis Mother   . Hypertension Mother   . Hyperlipidemia Mother   . Arthritis Mother     Social History   Socioeconomic History  . Marital status: Married    Spouse name: Not on file  . Number of children: 3  . Years of education: Not on file  . Highest education level: 12th grade  Occupational History  . Not on file  Tobacco Use  . Smoking status: Never Smoker  . Smokeless tobacco: Never Used  Vaping Use  . Vaping Use: Never used  Substance and Sexual Activity  . Alcohol use: No  . Drug use: No  . Sexual activity: Yes    Birth control/protection: Surgical  Other Topics Concern  . Not on file  Social History Narrative  . Not on file   Social Determinants of Health   Financial Resource Strain: Not on file  Food Insecurity: Food Insecurity Present  . Worried About Charity fundraiser in the Last Year: Sometimes true  . Ran Out of Food in the Last Year: Sometimes true  Transportation Needs: No Transportation Needs  . Lack of Transportation (Medical): No  . Lack of Transportation (Non-Medical): No  Physical Activity: Not on file  Stress: Not on file  Social Connections: Not on file  Intimate Partner Violence: Not on file    ROS Review of Systems  Constitutional: Positive for chills and fatigue. Negative for activity change, appetite change and fever.  HENT: Positive for congestion, postnasal drip, rhinorrhea, sinus pressure, sinus pain and sore throat. Negative for trouble swallowing and voice change.   Eyes: Positive for discharge. Negative for visual disturbance.  Respiratory: Positive for cough and wheezing. Negative for choking, chest tightness and shortness of breath.   Cardiovascular: Positive for leg swelling. Negative for chest pain and palpitations.       Leg  swelling bilaterally at the end of the day when she has been seated for long periods or standing.  Resolves by morning.  Gastrointestinal: Negative for abdominal distention, constipation, diarrhea, nausea and vomiting.  Endocrine: Negative for polydipsia, polyphagia and polyuria.  Genitourinary: Negative for menstrual problem, pelvic pain, vaginal bleeding, vaginal discharge and vaginal pain.  Skin: Negative for color change, pallor, rash and wound.  Neurological: Positive for headaches. Negative for dizziness, syncope and weakness.  Psychiatric/Behavioral: Negative for decreased concentration, dysphoric mood, sleep disturbance and suicidal ideas. The patient is not nervous/anxious.     Objective:   Today's Vitals: BP 126/73   Pulse 83   Ht 5\' 1"  (1.549 m)   Wt 235 lb 9.6 oz (106.9 kg)   LMP 09/20/2019 (Exact Date)   SpO2 97%   BMI 44.52 kg/m   Physical Exam Vitals and nursing  note reviewed.  Constitutional:      Appearance: Normal appearance. She is obese.  HENT:     Head: Normocephalic.     Comments: Tenderness noted to the frontal sinuses on palpation.    Right Ear: Tympanic membrane, ear canal and external ear normal.     Left Ear: Tympanic membrane, ear canal and external ear normal.     Nose: Congestion and rhinorrhea present.     Mouth/Throat:     Mouth: Mucous membranes are moist.     Pharynx: Oropharynx is clear. Posterior oropharyngeal erythema present.  Eyes:     General:        Right eye: Discharge present.        Left eye: Discharge present.    Extraocular Movements: Extraocular movements intact.     Conjunctiva/sclera: Conjunctivae normal.     Pupils: Pupils are equal, round, and reactive to light.     Comments: Clear watery discharge present bilaterally  Neck:     Vascular: No carotid bruit.  Cardiovascular:     Rate and Rhythm: Normal rate and regular rhythm.     Pulses: Normal pulses.     Heart sounds: Normal heart sounds. No murmur  heard.   Pulmonary:     Effort: Pulmonary effort is normal.     Breath sounds: Normal breath sounds. No wheezing.  Abdominal:     General: Abdomen is flat. Bowel sounds are normal. There is no distension.     Tenderness: There is no abdominal tenderness. There is no right CVA tenderness or left CVA tenderness.  Musculoskeletal:        General: Normal range of motion.     Cervical back: Normal range of motion. No rigidity or tenderness.     Right lower leg: No edema.     Left lower leg: No edema.  Lymphadenopathy:     Cervical: Cervical adenopathy present.  Skin:    General: Skin is warm and dry.     Capillary Refill: Capillary refill takes less than 2 seconds.  Neurological:     General: No focal deficit present.     Mental Status: She is alert and oriented to person, place, and time.  Psychiatric:        Mood and Affect: Mood normal.        Behavior: Behavior normal.        Thought Content: Thought content normal.        Judgment: Judgment normal.     Assessment & Plan:   Problem List Items Addressed This Visit      Respiratory   Moderate persistent asthma with acute exacerbation    Current flare of asthma symptoms due to sinusitis. Treatment with Augmentin twice daily for 5 days. Refill provided on albuterol inhaler. Patient instructed to contact the office if symptoms worsen or fail to improve.      Relevant Medications   albuterol (VENTOLIN HFA) 108 (90 Base) MCG/ACT inhaler     Digestive   Fatty liver disease, nonalcoholic    Recent elevated LFTs from previous provider with subsequent ultrasound determine the presence of fatty liver. Discussed this diagnosis with the patient and recommendations for changes to help reduce risks and improve overall health. She expressed understanding of information provided. She has already began making changes to her diet and exercise regimen which will certainly help with this. We will follow closely. She does have a history  of hyperlipidemia however I have advised her to hold her atorvastatin at this  time until we can monitor her liver functions again to ensure that the dosage is appropriate for her current liver function. Explained to the patient not through the medication away as she may need to restart this in the future.  She expressed understanding. Follow-up in 3 months-we will plan for repeat labs of hemoglobin A1c and CMP.      Relevant Orders   Comprehensive metabolic panel     Endocrine   DM2 (diabetes mellitus, type 2) ([place]) - Primary    Most recent hemoglobin A1c is elevated to 7.9%. She has been taking Metformin 500 mg twice a day but reports that she has just recently started on this medication. We will plan to increase the Metformin to 500 mg every morning and 1000 mg every evening with meals. Patient provided with a significant amount of education in native language on diabetes, diet, weight, and medication management. Utilization of the interpreter today to help reinforce understanding of current condition. We will plan to follow-up in approximately 3 months and repeat labs at that time and also monitor liver function as this was elevated on most recent readings.      Relevant Medications   metFORMIN (GLUCOPHAGE) 500 MG tablet   Other Relevant Orders   HgB A1c   Comprehensive metabolic panel     Other   OBESITY    BMI 44.52 at visit today. She has recently made significant changes to her diet and joined the gym which did help quite considerably in her efforts to lose weight and obtain better control over her chronic conditions. Encourage patient to continue with positive lifestyle changes. I am hopeful that increasing the Metformin will help with improved glucose control and with weight loss as well.  We will consider adding a GLP-1 future if current medication regimen is not effective in helping her diabetes and weight loss goals. Recommend follow-up in 3 months.      Relevant  Medications   metFORMIN (GLUCOPHAGE) 500 MG tablet   Encounter to establish care    New patient presenting today to establish care in this office. Review of past medical, surgical, social, family history as well as social determinants of health. Interpreter utilized for entire visit including history and instructions. All instructions provided in native language. We will plan to follow-up in 3 months.      Relevant Orders   MM 3D SCREEN BREAST BILATERAL      Outpatient Encounter Medications as of 09/09/2020  Medication Sig  . acetaminophen (TYLENOL) 325 MG tablet Take 650 mg by mouth every 6 (six) hours as needed.  Marland Kitchen albuterol (VENTOLIN HFA) 108 (90 Base) MCG/ACT inhaler Inhale 2 puffs into the lungs in the morning and at bedtime.  Marland Kitchen amoxicillin-clavulanate (AUGMENTIN) 875-125 MG tablet Take 1 tablet by mouth 2 (two) times daily.  Marland Kitchen atorvastatin (LIPITOR) 20 MG tablet Take 1 tablet (20 mg total) by mouth daily.  . Blood Glucose Monitoring Suppl (TRUE METRIX METER) DEVI 1 each by Does not apply route 3 (three) times daily before meals.  . diclofenac (VOLTAREN) 75 MG EC tablet Take 1 tablet (75 mg total) by mouth 2 (two) times daily as needed.  Marland Kitchen glucose blood (TRUE METRIX BLOOD GLUCOSE TEST) test strip Use 3 times daily before meals  . Microlet Lancets MISC Use to check blood sugar TID.  . Multiple Vitamin (MULTIVITAMIN WITH MINERALS) TABS tablet Take 1 tablet by mouth daily in the afternoon.  . traMADol (ULTRAM) 50 MG tablet Take 1 tablet (50 mg  total) by mouth 3 (three) times daily as needed.  . [DISCONTINUED] albuterol (VENTOLIN HFA) 108 (90 Base) MCG/ACT inhaler Inhale 2 puffs into the lungs in the morning and at bedtime.  . [DISCONTINUED] metFORMIN (GLUCOPHAGE) 500 MG tablet Take 1 tablet (500 mg total) by mouth 2 (two) times daily with a meal.  . metFORMIN (GLUCOPHAGE) 500 MG tablet TAKE 1 TABLET WITH BREAKFAST AND 2 TABLETS WITH DINNER   No facility-administered encounter  medications on file as of 09/09/2020.    Follow-up: Return in about 3 months (around 12/09/2020) for DM/LFT's with lab appointment.   Orma Render, NP

## 2020-09-11 ENCOUNTER — Other Ambulatory Visit (HOSPITAL_BASED_OUTPATIENT_CLINIC_OR_DEPARTMENT_OTHER): Payer: Self-pay | Admitting: Nurse Practitioner

## 2020-09-11 ENCOUNTER — Telehealth (INDEPENDENT_AMBULATORY_CARE_PROVIDER_SITE_OTHER): Payer: BC Managed Care – PPO | Admitting: Nurse Practitioner

## 2020-09-11 ENCOUNTER — Encounter (HOSPITAL_BASED_OUTPATIENT_CLINIC_OR_DEPARTMENT_OTHER): Payer: Self-pay

## 2020-09-11 ENCOUNTER — Other Ambulatory Visit: Payer: Self-pay

## 2020-09-11 DIAGNOSIS — J4541 Moderate persistent asthma with (acute) exacerbation: Secondary | ICD-10-CM

## 2020-09-11 DIAGNOSIS — J9801 Acute bronchospasm: Secondary | ICD-10-CM

## 2020-09-11 DIAGNOSIS — J329 Chronic sinusitis, unspecified: Secondary | ICD-10-CM

## 2020-09-11 DIAGNOSIS — J40 Bronchitis, not specified as acute or chronic: Secondary | ICD-10-CM

## 2020-09-11 DIAGNOSIS — R051 Acute cough: Secondary | ICD-10-CM

## 2020-09-11 MED ORDER — PREDNISONE 20 MG PO TABS
40.0000 mg | ORAL_TABLET | Freq: Every day | ORAL | 0 refills | Status: DC
  Filled 2020-09-11: qty 10, 5d supply, fill #0

## 2020-09-11 MED ORDER — HYDROCODONE-HOMATROPINE 5-1.5 MG/5ML PO SYRP: 5.0000 mL | ORAL_SOLUTION | Freq: Every evening | ORAL | 0 refills | Status: DC | PRN

## 2020-09-11 MED ORDER — HYDROCODONE-HOMATROPINE 5-1.5 MG/5ML PO SYRP
5.0000 mL | ORAL_SOLUTION | Freq: Every evening | ORAL | 0 refills | Status: DC | PRN
  Filled 2020-09-11: qty 120, 24d supply, fill #0

## 2020-09-11 NOTE — Telephone Encounter (Signed)
Patient seen on Monday 09/09/2020 to establish care and for acute asthma exacerbation with suspected sinusitis that started last week. Patients husband called today to report that symptoms of cough are worsening and patient is experiencing a burning in her chest with frequent cough. She is also unable to sleep due to the cough. Recommend Mucinex for daytime use in addition to Augmentin sent Monday. Also recommend as needed use of Albuterol inhaler. Will add 40mg  prednisone burst for 5 days to reduce inflammation and Hycodan for night time cough. Instructed to call if symptoms do not improve in the next 2 days and to seek emergency evaluation if she begins to experience light headedness, severe shortness of breath, chest pain, dizziness, or high fevers.

## 2020-10-14 DIAGNOSIS — M542 Cervicalgia: Secondary | ICD-10-CM

## 2020-10-14 HISTORY — DX: Cervicalgia: M54.2

## 2020-10-23 ENCOUNTER — Ambulatory Visit: Payer: BC Managed Care – PPO | Admitting: Surgery

## 2020-10-24 ENCOUNTER — Ambulatory Visit (INDEPENDENT_AMBULATORY_CARE_PROVIDER_SITE_OTHER): Payer: BC Managed Care – PPO | Admitting: Orthopaedic Surgery

## 2020-10-24 ENCOUNTER — Other Ambulatory Visit: Payer: Self-pay

## 2020-10-24 ENCOUNTER — Encounter: Payer: Self-pay | Admitting: Orthopaedic Surgery

## 2020-10-24 VITALS — Ht 61.0 in | Wt 235.0 lb

## 2020-10-24 DIAGNOSIS — G5621 Lesion of ulnar nerve, right upper limb: Secondary | ICD-10-CM | POA: Diagnosis not present

## 2020-10-24 NOTE — Progress Notes (Signed)
Office Visit Note   Patient: Alexandra Norris           Date of Birth: 1975-06-22           MRN: [id number] Visit Date: 10/24/2020              Requested by: Orma Render, NP [address] [place],  [place] [address] PCP: Orma Render, NP   Assessment & Plan: Visit Diagnoses:  1. Cubital tunnel syndrome on right     Plan: Impression is right cubital tunnel syndrome.  We will obtain nerve conduction studies as well as send referral to OT for elbow extension splint.  Follow-up after EMGs.  Today's encounter was performed through an interpreter.  Follow-Up Instructions: Return for after NCS/EMG.   Orders:  Orders Placed This Encounter  Procedures  . Ambulatory referral to Occupational Therapy   No orders of the defined types were placed in this encounter.     Procedures: No procedures performed   Clinical Data: No additional findings.   Subjective: Chief Complaint  Patient presents with  . Right Hand - Pain, Numbness    [name] comes in today for follow-up right elbow pain and numbness tingling for about a month.  Symptoms wake her up at night.  She has used a counterforce brace which has not helped.  She states that she has pain and numbness in the ring and small finger.  The left carpal tunnel injection helped significantly.  Language interpreter present today.   Review of Systems  Constitutional: Negative.   HENT: Negative.   Eyes: Negative.   Respiratory: Negative.   Cardiovascular: Negative.   Endocrine: Negative.   Musculoskeletal: Negative.   Neurological: Negative.   Hematological: Negative.   Psychiatric/Behavioral: Negative.   All other systems reviewed and are negative.    Objective: Vital Signs: Ht 5\' 1"  (1.549 m)   Wt 235 lb (106.6 kg)   LMP 09/20/2019 (Exact Date)   BMI 44.40 kg/m   Physical Exam Vitals and nursing note reviewed.  Constitutional:      Appearance: She is well-developed.  Pulmonary:      Effort: Pulmonary effort is normal.  Skin:    General: Skin is warm.     Capillary Refill: Capillary refill takes less than 2 seconds.  Neurological:     Mental Status: She is alert and oriented to person, place, and time.  Psychiatric:        Behavior: Behavior normal.        Thought Content: Thought content normal.        Judgment: Judgment normal.     Ortho Exam Right elbow shows positive Tinel.  Stable ulnar nerve.  Decree sensation in the ulnar nerve distribution of the hand.  No muscle atrophy of the hand.  Negative Wartenberg, Jeanne signs. Specialty Comments:  No specialty comments available.  Imaging: No results found.   PMFS History: Patient Active Problem List   Diagnosis Date Noted  . Encounter to establish care 09/09/2020  . Fatty liver disease, nonalcoholic 46/50/3546  . Elevated blood-pressure reading, without diagnosis of hypertension 06/17/2020  . Influenza vaccine needed 04/03/2020  . Cervical disc disorder with radiculopathy 04/03/2020  . S/P carpal tunnel release 08/30/2019  . Right carpal tunnel syndrome 07/11/2019  . Left carpal tunnel syndrome 07/11/2019  . Arthritis   . Anemia   . Seasonal allergies 10/08/2016  . Nasal obstruction 02/18/2016  . DM2 (diabetes mellitus, type 2) ([facility]) 10/21/2015  .  Bilateral chronic knee pain 09/25/2015  . GASTROESOPHAGEAL REFLUX DISEASE 08/14/2009  . DEPRESSION, SITUATIONAL, PROLONGED 02/05/2009  . OBESITY 05/30/2007  . Moderate persistent asthma with acute exacerbation 12/06/2006   Past Medical History:  Diagnosis Date  . Anemia   . Anxiety   . Arthritis   . Asthma   . Carpal tunnel syndrome   . Depression   . Dyspnea   . Endometriosis   . History of blood transfusion 1999   w/vaginal delivery  . History of bronchitis    "I've stayed in hospital 2 X w/bronchitis"  . Migraine   . Pneumonia   . PONV (postoperative nausea and vomiting)   . Pre-diabetes 10/03/2019   A1C 6.4    Family History   Problem Relation Age of Onset  . Diabetes Mother   . Osteoarthritis Mother   . Hypertension Mother   . Hyperlipidemia Mother   . Arthritis Mother     Past Surgical History:  Procedure Laterality Date  . ABDOMINAL HYSTERECTOMY  10/17/2019  . APPENDECTOMY    . CARPAL TUNNEL RELEASE Right 07/19/2019   Procedure: RIGHT CARPAL TUNNEL RELEASE;  Surgeon: Leandrew Koyanagi, MD;  Location: [facility];  Service: Orthopedics;  Laterality: Right;  . CARPAL TUNNEL RELEASE Left 09/13/2019   Procedure: LEFT CARPAL TUNNEL RELEASE;  Surgeon: Leandrew Koyanagi, MD;  Location: [facility];  Service: Orthopedics;  Laterality: Left;  Bier block  . CESAREAN SECTION  1997; 2008  . CHOLECYSTECTOMY  2011  . TONSILLECTOMY    . tonsilletomy    . TUBAL LIGATION  04/2007  . VAGINAL HYSTERECTOMY Bilateral 10/17/2019   Procedure: HYSTERECTOMY VAGINAL WITH SALPINGECTOMY;  Surgeon: Chancy Milroy, MD;  Location: [facility];  Service: Gynecology;  Laterality: Bilateral;   Social History   Occupational History  . Not on file  Tobacco Use  . Smoking status: Never Smoker  . Smokeless tobacco: Never Used  Vaping Use  . Vaping Use: Never used  Substance and Sexual Activity  . Alcohol use: No  . Drug use: No  . Sexual activity: Yes    Birth control/protection: Surgical

## 2020-10-24 NOTE — Addendum Note (Signed)
Addended by: Precious Bard on: 10/24/2020 09:57 AM   Modules accepted: Orders

## 2020-10-31 ENCOUNTER — Other Ambulatory Visit: Payer: Self-pay

## 2020-10-31 ENCOUNTER — Ambulatory Visit
Admission: RE | Admit: 2020-10-31 | Discharge: 2020-10-31 | Disposition: A | Discharge status: Home / Self Care | Payer: BC Managed Care – PPO | Source: Ambulatory Visit | Attending: Nurse Practitioner | Admitting: Nurse Practitioner

## 2020-10-31 DIAGNOSIS — Z7689 Persons encountering health services in other specified circumstances: Secondary | ICD-10-CM

## 2020-10-31 IMAGING — MG MM DIGITAL SCREENING BILAT W/ TOMO AND CAD
6 of 12 series · 6 of 36 positions shown · non-contrast
Comparison: Previous exam(s).

CLINICAL DATA: Screening.

EXAM:
DIGITAL SCREENING BILATERAL MAMMOGRAM WITH TOMOSYNTHESIS AND CAD
TECHNIQUE: Bilateral screening digital craniocaudal and mediolateral oblique
mammograms were obtained. Bilateral screening digital breast
tomosynthesis was performed. The images were evaluated with
computer-aided detection.

[L CC synth-2D (1 of 2)]
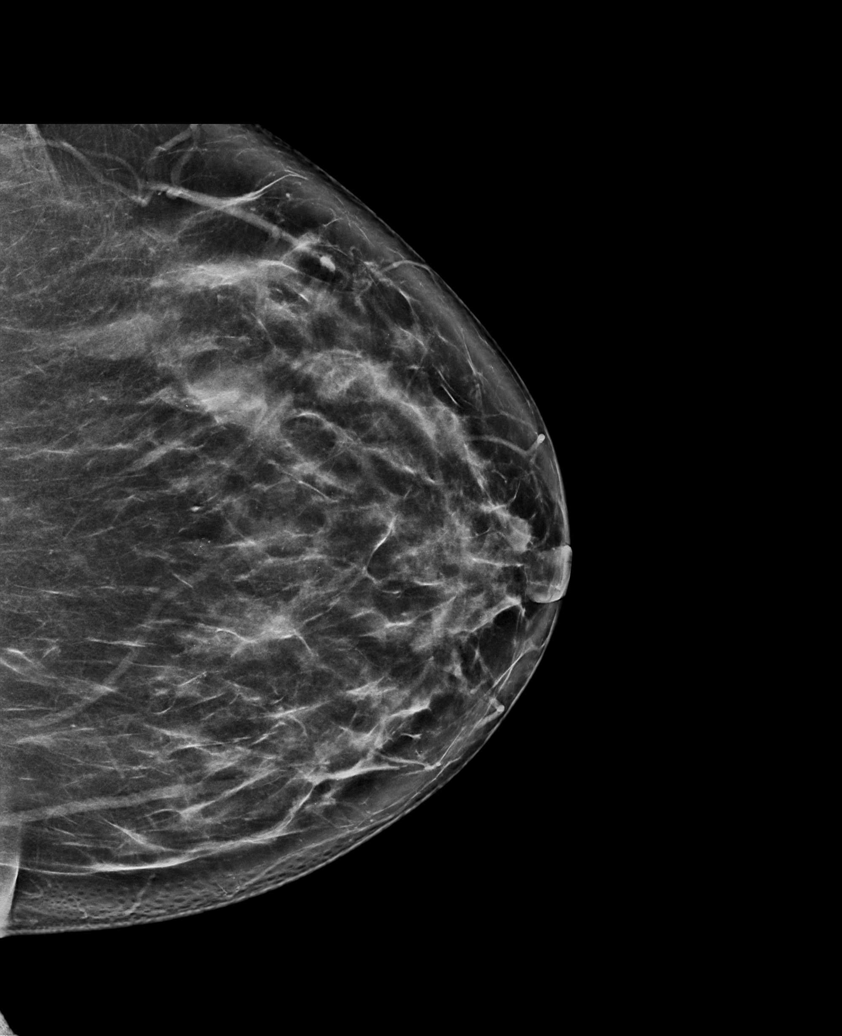

[R CC synth-2D (1 of 2)]
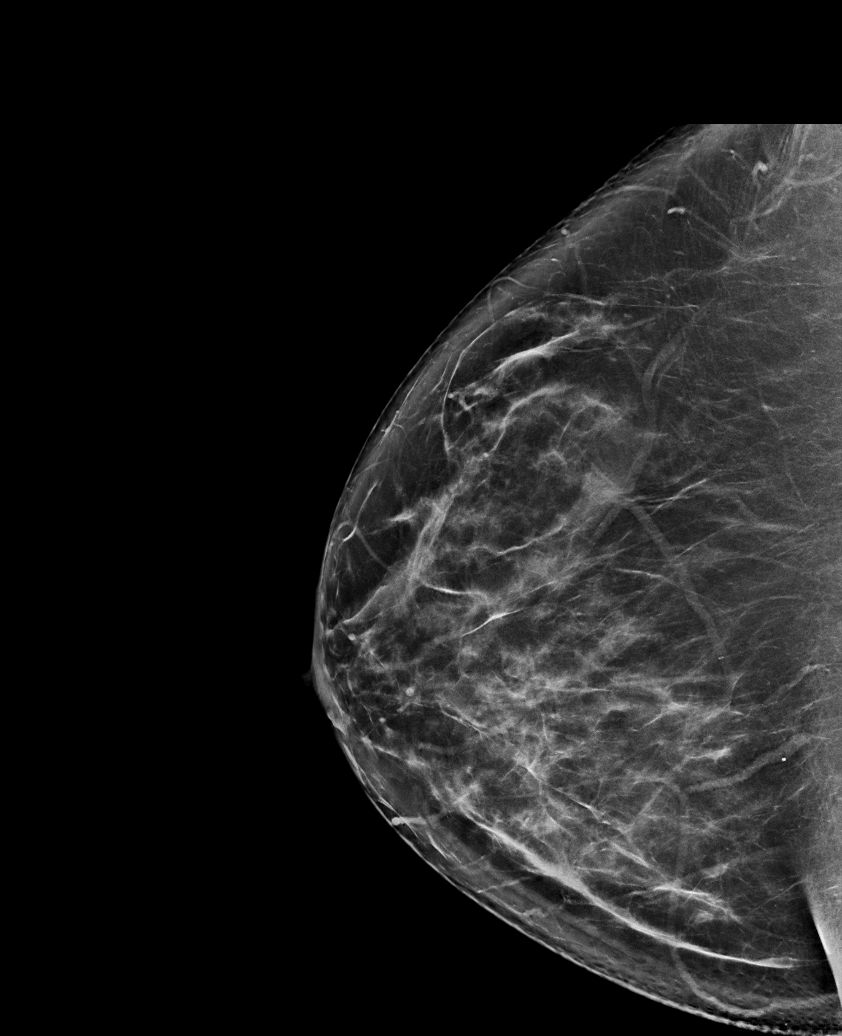

[L MLO synth-2D]
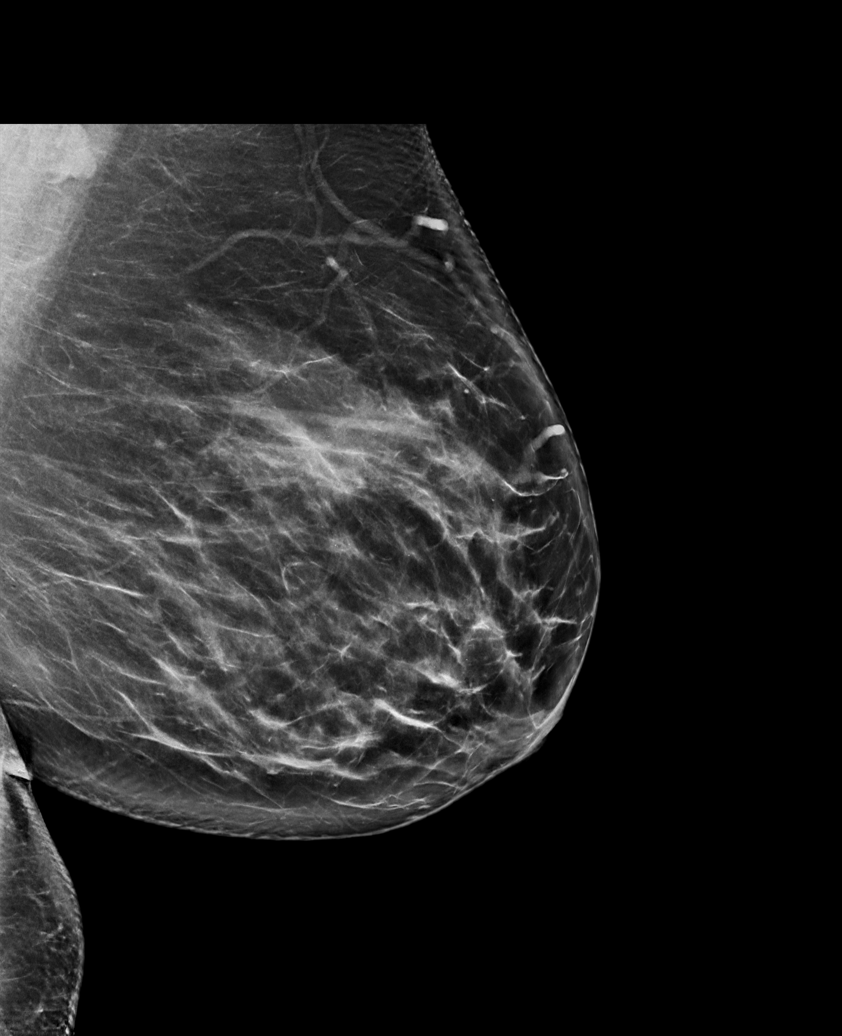

[R MLO synth-2D]
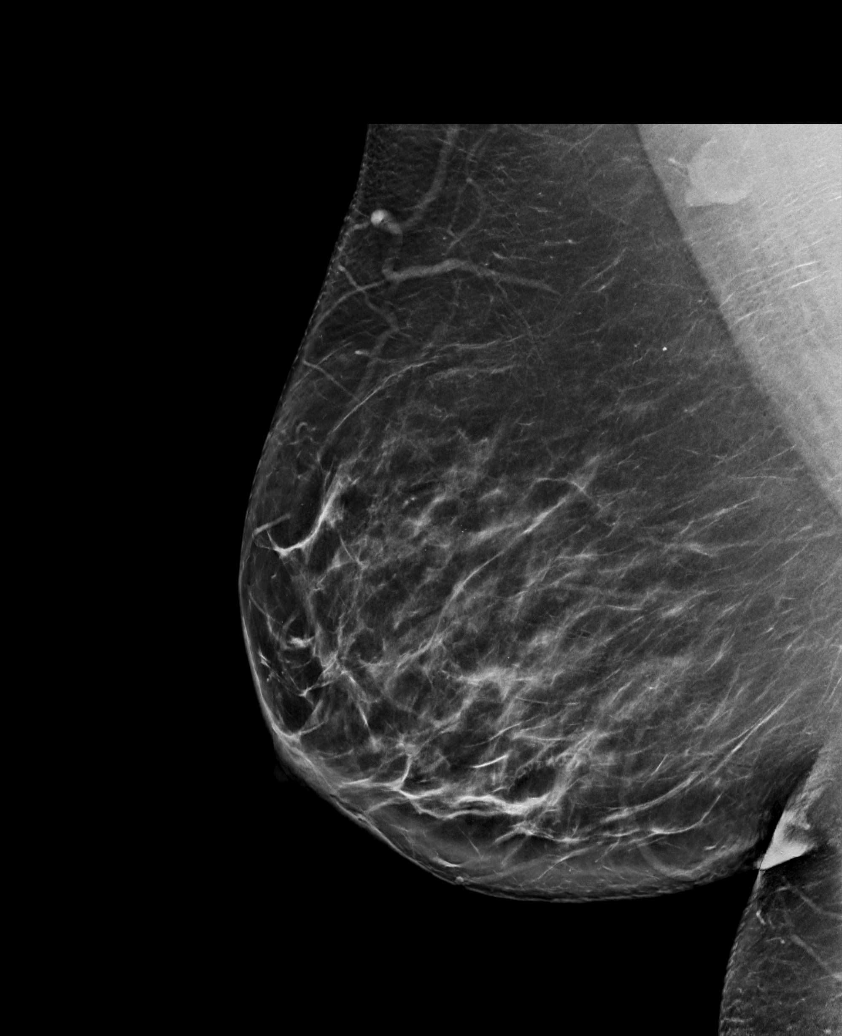

[R CC synth-2D (2 of 2)]
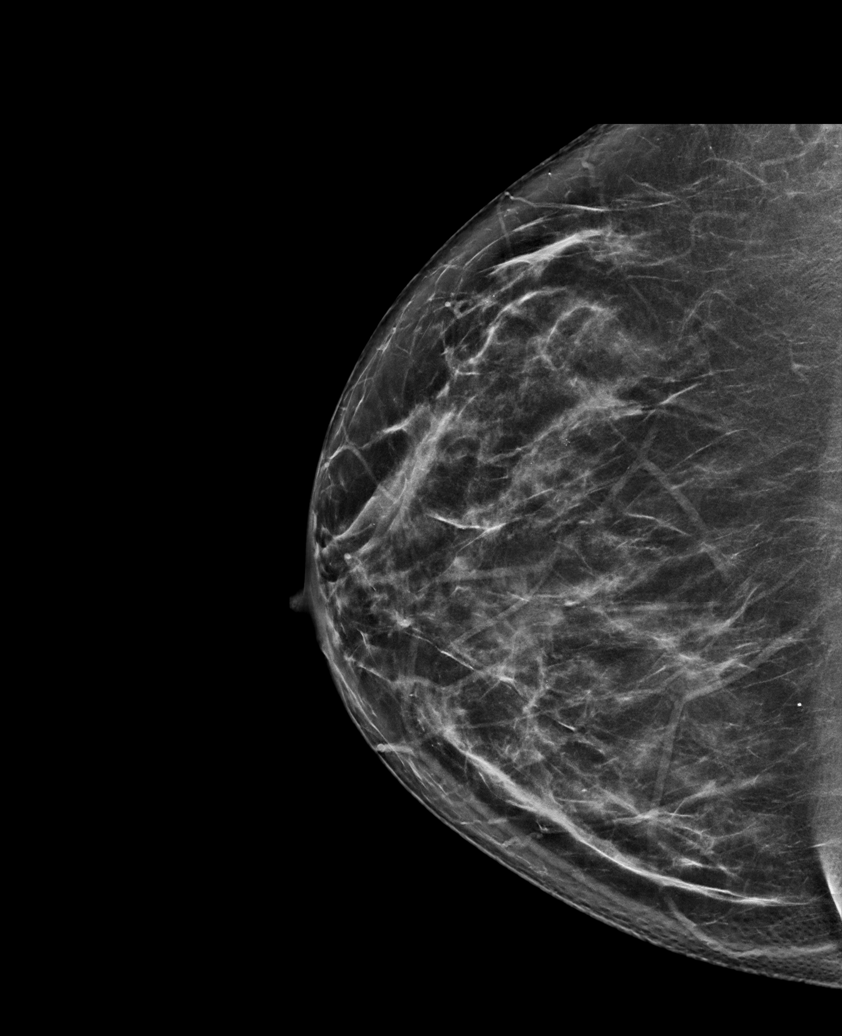

[L CC synth-2D (2 of 2)]
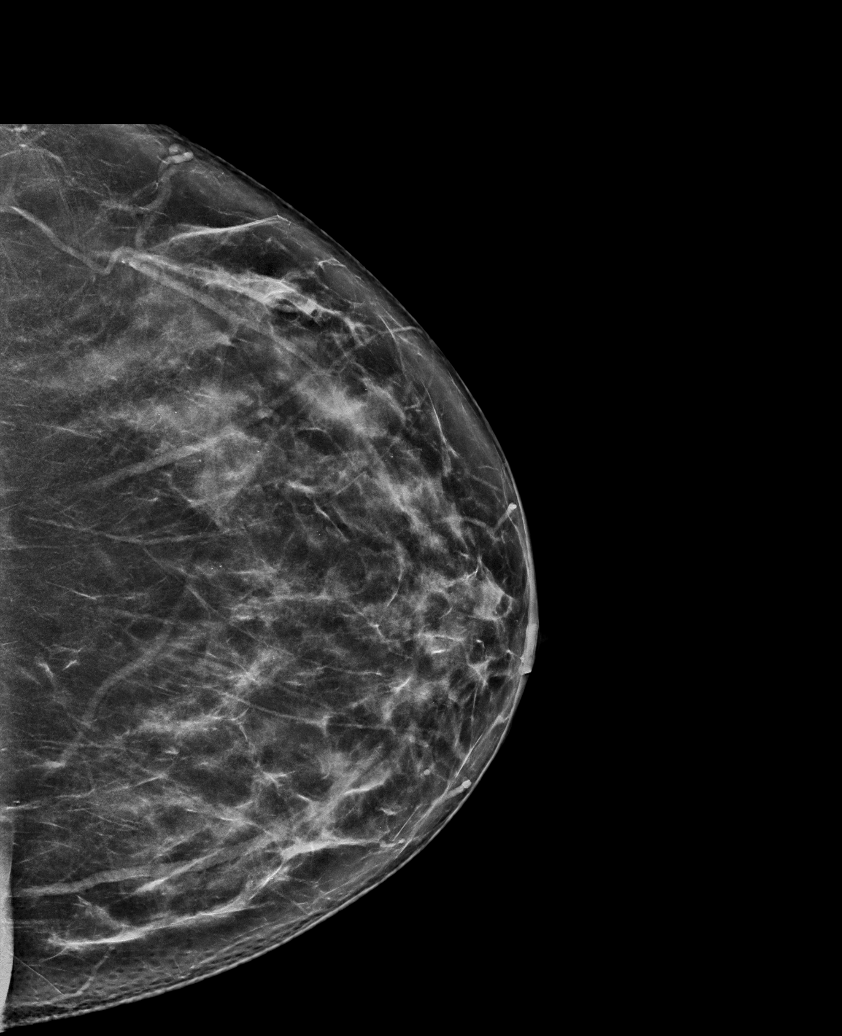

[6 of 36 positions shown; findings below may reference images not displayed]

ACR Breast Density Category c: The breast tissue is heterogeneously
dense, which may obscure small masses.
FINDINGS: There are no findings suspicious for malignancy. The images were
evaluated with computer-aided detection.
IMPRESSION: No mammographic evidence of malignancy. A result letter of this
screening mammogram will be mailed directly to the patient.

RECOMMENDATION:
Screening mammogram in one year. (Code:[6J])

BI-RADS CATEGORY  1: Negative.

## 2020-11-07 ENCOUNTER — Other Ambulatory Visit: Payer: Self-pay

## 2020-11-07 ENCOUNTER — Encounter: Payer: Self-pay | Admitting: Occupational Therapy

## 2020-11-07 ENCOUNTER — Ambulatory Visit: Payer: BC Managed Care – PPO | Attending: Orthopaedic Surgery | Admitting: Occupational Therapy

## 2020-11-07 DIAGNOSIS — M6281 Muscle weakness (generalized): Secondary | ICD-10-CM | POA: Diagnosis present

## 2020-11-07 DIAGNOSIS — R208 Other disturbances of skin sensation: Secondary | ICD-10-CM | POA: Insufficient documentation

## 2020-11-07 DIAGNOSIS — M25521 Pain in right elbow: Secondary | ICD-10-CM | POA: Diagnosis present

## 2020-11-07 NOTE — Therapy (Signed)
[facility] [address] [place], [place], [address] Phone: [phone]   Fax:  [phone]  Occupational Therapy Evaluation  Patient Details  Name: Alexandra Norris MRN: [id number] Date of Birth: March 05, 1976 Referring Provider (OT): Dr. Erlinda Hong   Encounter Date: 11/07/2020   OT End of Session - 11/07/20 1603    Visit Number 1    Number of Visits 5    Date for OT Re-Evaluation 12/12/20    Authorization Type BCBS    OT Start Time 1751    OT Stop Time 1530    OT Time Calculation (min) 85 min           Past Medical History:  Diagnosis Date  . Anemia   . Anxiety   . Arthritis   . Asthma   . Carpal tunnel syndrome   . Depression   . Dyspnea   . Endometriosis   . History of blood transfusion 1999   w/vaginal delivery  . History of bronchitis    "I've stayed in hospital 2 X w/bronchitis"  . Migraine   . Pneumonia   . PONV (postoperative nausea and vomiting)   . Pre-diabetes 10/03/2019   A1C 6.4    Past Surgical History:  Procedure Laterality Date  . ABDOMINAL HYSTERECTOMY  10/17/2019  . APPENDECTOMY    . CARPAL TUNNEL RELEASE Right 07/19/2019   Procedure: RIGHT CARPAL TUNNEL RELEASE;  Surgeon: Leandrew Koyanagi, MD;  Location: [facility];  Service: Orthopedics;  Laterality: Right;  . CARPAL TUNNEL RELEASE Left 09/13/2019   Procedure: LEFT CARPAL TUNNEL RELEASE;  Surgeon: Leandrew Koyanagi, MD;  Location: [facility];  Service: Orthopedics;  Laterality: Left;  Bier block  . CESAREAN SECTION  1997; 2008  . CHOLECYSTECTOMY  2011  . TONSILLECTOMY    . tonsilletomy    . TUBAL LIGATION  04/2007  . VAGINAL HYSTERECTOMY Bilateral 10/17/2019   Procedure: HYSTERECTOMY VAGINAL WITH SALPINGECTOMY;  Surgeon: Chancy Milroy, MD;  Location: [facility];  Service: Gynecology;  Laterality: Bilateral;    There were no vitals filed for this visit.   Subjective Assessment - 11/07/20 1410     Subjective  Pt reports her elbow is painful particularly at night.    Pertinent History Pt is a 45 y.o female with daignosis of cubital tunnel syndrome referred by Dr. Erlinda Hong . PMH: arthritis, carpal tunnel syndrome, anxiety, depression, hx of C5-C6 ACDF Nov 2021,    Currently in Pain? Yes    Pain Score 7     Pain Location Elbow    Pain Orientation Left    Pain Descriptors / Indicators Aching    Pain Type Chronic pain    Pain Onset More than a month ago   2 years   Pain Frequency Intermittent    Aggravating Factors  night time    Pain Relieving Factors medicine             [facility] OT Assessment - 11/07/20 0001      Assessment   Medical Diagnosis cubilat tunnel syndrome    Referring Provider (OT) Dr. Erlinda Hong    Onset Date/Surgical Date 10/24/20      Precautions   Precautions Other (comment)    Precaution Comments long arm splint wear at night      Home  Environment   Family/patient expects to be discharged to: Private residence    Lives With Family      Prior Function   Level of Independence  Independent      ADL   ADL comments Modified I with all basic ADLS, pt reports some difficulty with wasing and styling her hair      Mobility   Mobility Status Independent      Sensation   Additional Comments pain and sensory changes at elbow      ROM / Strength   AROM / PROM / Strength AROM      AROM   Overall AROM  --   not tested due to time constraints.                   OT Treatments/Exercises (OP) - 11/07/20 0001      Splinting   Splinting Pt was fitted with a custom long arm dorsal elbow brace with elbow approximating 45* for night time wear. Pt was educated in splint wear, care and precautions. Pt was also educated to avoid repetative elbow flexion and to avoid resting arm on hard surfaces to minimize symptoms                 OT Education - 11/07/20 1619    Education Details nighttime cubital tunnel splint wear, care and instructions- interpreter reviwed  verbally with pt, pt reports her husband speaks English, Therapist also reveiwed importance of avoiding repetative elbow flexion and avoiding resting right elbow on hard surfaces.    Person(s) Educated Patient;Parent(s)   via interpreter   Methods Explanation;Demonstration;Verbal cues;Handout    Comprehension Verbalized understanding;Returned demonstration               OT Long Term Goals - 11/07/20 1609      OT LONG TERM GOAL #1   Title I with splint wear care and precautions   Time 5    Period Weeks    Status New    Target Date 12/12/20      OT LONG TERM GOAL #2   Title I with HEP prn    Time 5    Period Weeks    Status New      OT LONG TERM GOAL #3   Title Pt will verbalize understanding of positioning and activity modification to minimize symptoms.    Time 5    Period Weeks    Status New                 Plan - 11/07/20 1605    Clinical Impression Statement Pt is a 45 y.o female with diagnosis of cubital tunnel syndrome referred by Dr. Erlinda Hong . PMH: arthritis, carpal tunnel syndrome, anxiety, depression, hx of C5-C6 ACDF Nov 2021, Pt presents with the following deficits :pain, numbness, decreased strength which impedes performance of ADLS/ IADLs.    Pt can benefit from skilled occupational therapy to address these deficits in order to maximize pt's safety and I with ADLs/IADLs.    OT Occupational Profile and History Problem Focused Assessment - Including review of records relating to presenting problem    Occupational performance deficits (Please refer to evaluation for details): ADL's;IADL's;Leisure;Social Participation;Work;Play    Body Structure / Function / Physical Skills ADL;Endurance;UE functional use;Pain;Flexibility;ROM;Coordination;GMC;Decreased knowledge of precautions;Sensation;Decreased knowledge of use of DME;IADL;Dexterity    Rehab Potential Good    Clinical Decision Making Limited treatment options, no task modification necessary    Comorbidities  Affecting Occupational Performance: May have comorbidities impacting occupational performance    Modification or Assistance to Complete Evaluation  No modification of tasks or assist necessary to complete eval    OT Frequency 1x /  week    OT Duration --   5 weeks   OT Treatment/Interventions Self-care/ADL training;Ultrasound;Patient/family education;DME and/or AE instruction;Passive range of motion;Cryotherapy;Electrical Stimulation;Splinting;Therapeutic activities;Manual Therapy;Therapeutic exercise;Moist Heat;Neuromuscular education    Plan splint check and modification prn, consider ulnar n. gliding, education regarding positioning    Consulted and Agree with Plan of Care Patient;Family member/caregiver;Other (Comment)    Family Member Consulted mom, interpreter,           Patient will benefit from skilled therapeutic intervention in order to improve the following deficits and impairments:   Body Structure / Function / Physical Skills: ADL,Endurance,UE functional use,Pain,Flexibility,ROM,Coordination,GMC,Decreased knowledge of precautions,Sensation,Decreased knowledge of use of DME,IADL,Dexterity       Visit Diagnosis: Muscle weakness (generalized) - Plan: Ot plan of care cert/re-cert  Other disturbances of skin sensation - Plan: Ot plan of care cert/re-cert  Pain in right elbow - Plan: Ot plan of care cert/re-cert    Problem List Patient Active Problem List   Diagnosis Date Noted  . Encounter to establish care 09/09/2020  . Fatty liver disease, nonalcoholic 41/32/4401  . Elevated blood-pressure reading, without diagnosis of hypertension 06/17/2020  . Influenza vaccine needed 04/03/2020  . Cervical disc disorder with radiculopathy 04/03/2020  . S/P carpal tunnel release 08/30/2019  . Right carpal tunnel syndrome 07/11/2019  . Left carpal tunnel syndrome 07/11/2019  . Arthritis   . Anemia   . Seasonal allergies 10/08/2016  . Nasal obstruction 02/18/2016  . DM2 (diabetes  mellitus, type 2) ([facility]) 10/21/2015  . Bilateral chronic knee pain 09/25/2015  . GASTROESOPHAGEAL REFLUX DISEASE 08/14/2009  . DEPRESSION, SITUATIONAL, PROLONGED 02/05/2009  . OBESITY 05/30/2007  . Moderate persistent asthma with acute exacerbation 12/06/2006    Joon Pohle 11/07/2020, 4:27 PM Theone Murdoch, OTR/L [phone] Phone: [phone] 4:27 PM 11/07/20 [facility] [address] [place], [place], [address] Phone: [phone]   Fax:  [phone]  Name: Ardath Lepak MRN: [id number] Date of Birth: 18-Apr-1976

## 2020-11-07 NOTE — Patient Instructions (Signed)
.  Your Splint This splint should initially be fitted by a healthcare practitioner.  The healthcare practitioner is responsible for providing wearing instructions and precautions to the patient, other healthcare practitioners and care provider involved in the patient's care.  This splint was custom made for you. Please read the following instructions to learn about wearing and caring for your splint.  Precautions Should your splint cause any of the following problems, remove the splint immediately and contact your therapist/physician.  Swelling  Severe Pain  Pressure Areas  Stiffness  Numbness  Do not wear your splint while operating machinery unless it has been fabricated for that purpose.  When To Wear Your Splint Where your splint according to your therapist/physician instructions.  Today wear your splint for 1 hour while you are resting, check your skin for pressure areas and redness, if no problems proceed to wearing at nighttime.  Wear your splint at night, stop wearing splint if pain or pressure areas.  Care and Cleaning of Your Splint 1. Keep your splint away from open flames. 2. Your splint will lose its shape in temperatures over 135 degrees Farenheit, ( in car windows, near radiators, ovens or in hot water).  Never make any adjustments to your splint, if the splint needs adjusting remove it and make an appointment to see your therapist. 3.

## 2020-11-13 ENCOUNTER — Telehealth (HOSPITAL_BASED_OUTPATIENT_CLINIC_OR_DEPARTMENT_OTHER): Payer: Self-pay | Admitting: Nurse Practitioner

## 2020-11-13 NOTE — Telephone Encounter (Signed)
Pt is having chest pains on the left side near left breast. Jaclyn Shaggy suggested she visit ED if pain is bad. Pt stated she did not want to go to ED again, that she would rather see [facility]BE on a VOV tomorrow morning

## 2020-11-14 ENCOUNTER — Other Ambulatory Visit: Payer: Self-pay

## 2020-11-14 ENCOUNTER — Ambulatory Visit (INDEPENDENT_AMBULATORY_CARE_PROVIDER_SITE_OTHER): Payer: BC Managed Care – PPO | Admitting: Nurse Practitioner

## 2020-11-14 ENCOUNTER — Ambulatory Visit: Payer: BC Managed Care – PPO | Admitting: Occupational Therapy

## 2020-11-14 ENCOUNTER — Encounter (HOSPITAL_BASED_OUTPATIENT_CLINIC_OR_DEPARTMENT_OTHER): Payer: Self-pay | Admitting: Nurse Practitioner

## 2020-11-14 VITALS — BP 131/48 | HR 81 | Ht 61.0 in | Wt 239.2 lb

## 2020-11-14 DIAGNOSIS — B029 Zoster without complications: Secondary | ICD-10-CM

## 2020-11-14 DIAGNOSIS — B028 Zoster with other complications: Secondary | ICD-10-CM | POA: Diagnosis not present

## 2020-11-14 DIAGNOSIS — B0223 Postherpetic polyneuropathy: Secondary | ICD-10-CM

## 2020-11-14 HISTORY — DX: Zoster without complications: B02.9

## 2020-11-14 HISTORY — DX: Postherpetic polyneuropathy: B02.23

## 2020-11-14 MED ORDER — VALACYCLOVIR HCL 1 G PO TABS
1000.0000 mg | ORAL_TABLET | Freq: Three times a day (TID) | ORAL | 0 refills | Status: DC
  Filled 2020-11-14: qty 21, 7d supply, fill #0

## 2020-11-14 MED ORDER — GABAPENTIN 100 MG PO CAPS
ORAL_CAPSULE | ORAL | 3 refills | Status: DC
  Filled 2020-11-14: qty 150, 25d supply, fill #0

## 2020-11-14 NOTE — Progress Notes (Signed)
Acute Office Visit  Subjective:    Patient ID: Alexandra Norris, female    DOB: 1975/11/25, 45 y.o.   MRN: [id number]  Chief Complaint  Patient presents with   Breast Pain    Patient states she had a mammogram on 10/31/20 and was told she needed to follow up with her PCP.  She has pain in left breast.  Today the pain is 5, but yesterday it was a 10.    HPI Patient is in today for Breast Pain.  She is here with an interpreter, Elta Guadeloupe.   Breast pain intermittently for a few weeks, described as burning and "needles" in the left breast.  She recently noticed a rash with small blisters on the left breast traveling from the sternum and under the breast to the axilla.  She has been using powder, but the pain is very intense at times.  She reports the pain is worse at night and when she is hot.  She is not sure if she had chicken pox as a child.    Past Medical History:  Diagnosis Date   Anemia    Anxiety    Arthritis    Asthma    Carpal tunnel syndrome    Depression    Dyspnea    Endometriosis    History of blood transfusion 1999   w/vaginal delivery   History of bronchitis    "I've stayed in hospital 2 X w/bronchitis"   Migraine    Pneumonia    PONV (postoperative nausea and vomiting)    Pre-diabetes 10/03/2019   A1C 6.4    Outpatient Medications Prior to Visit  Medication Sig Dispense Refill   acetaminophen (TYLENOL) 325 MG tablet Take 650 mg by mouth every 6 (six) hours as needed.     albuterol (VENTOLIN HFA) 108 (90 Base) MCG/ACT inhaler Inhale 2 puffs into the lungs in the morning and at bedtime. 18 g 2   atorvastatin (LIPITOR) 20 MG tablet TAKE 1 TABLET (20 MG TOTAL) BY MOUTH DAILY. 30 tablet 6   Blood Glucose Monitoring Suppl (CONTOUR NEXT MONITOR) w/Device KIT USE SEGUN LO INDICADO 3 VECES AL DIA ANTES DE LAS COMIDAS 1 kit 0   Blood Glucose Monitoring Suppl (TRUE METRIX METER) DEVI 1 each by Does not apply route 3 (three) times daily before meals. 1 each  0   cyclobenzaprine (FLEXERIL) 10 MG tablet TOME 1 PASTILLA POR VIA ORAL 3 VECES AL DIA 90 tablet 2   glucose blood (TRUE METRIX BLOOD GLUCOSE TEST) test strip Use 3 times daily before meals 100 each 12   glucose blood test strip USE SEGUN LO INDICADO 3 VECES AL DIA ANTES DE LAS COMIDAS (Patient taking differently: USE SEGUN LO INDICADO 3 VECES AL DIA ANTES DE LAS COMIDAS) 100 strip 12   metFORMIN (GLUCOPHAGE) 500 MG tablet TAKE 1 TABLET WITH BREAKFAST AND 2 TABLETS WITH DINNER (Patient taking differently: TAKE 1 TABLET WITH BREAKFAST AND 2 TABLETS WITH DINNER) 60 tablet 6   metFORMIN (GLUCOPHAGE) 500 MG tablet TAKE 1 TABLET (500 MG TOTAL) BY MOUTH 2 (TWO) TIMES DAILY WITH A MEAL. 60 tablet 6   Microlet Lancets MISC Use to check blood sugar TID. 100 each 2   Microlet Lancets MISC USE TO CHECK BLOOD SUGAR 3 VECES AL DIA 100 each 2   Multiple Vitamin (MULTIVITAMIN WITH MINERALS) TABS tablet Take 1 tablet by mouth daily in the afternoon.     TRUEplus Lancets 28G MISC USE SEGUN LO INDICADO 3 VECES  AL DIA ANTES DE LAS COMIDAS 100 each 12   amoxicillin-clavulanate (AUGMENTIN) 875-125 MG tablet Take 1 tablet by mouth 2 (two) times daily. 10 tablet 0   atorvastatin (LIPITOR) 20 MG tablet Take 1 tablet (20 mg total) by mouth daily. 30 tablet 6   diclofenac (VOLTAREN) 75 MG EC tablet Take 1 tablet (75 mg total) by mouth 2 (two) times daily as needed. 60 tablet 0   diclofenac (VOLTAREN) 75 MG EC tablet TAKE 1 TABLET (75 MG TOTAL) BY MOUTH 2 (TWO) TIMES DAILY AS NEEDED. 60 tablet 0   HYDROcodone-homatropine (HYCODAN) 5-1.5 MG/5ML syrup Take 5 mLs by mouth at bedtime as needed for cough. 120 mL 0   predniSONE (DELTASONE) 20 MG tablet Take 2 tablets (40 mg total) by mouth daily with breakfast. 10 tablet 0   traMADol (ULTRAM) 50 MG tablet Take 1 tablet (50 mg total) by mouth 3 (three) times daily as needed. 30 tablet 0   No facility-administered medications prior to visit.    No Known Allergies  Review of  Systems All review of systems negative except what is listed in the HPI     Objective:    Physical Exam Vitals and nursing note reviewed.  Constitutional:      Appearance: Normal appearance. She is obese.  HENT:     Head: Normocephalic.  Eyes:     Extraocular Movements: Extraocular movements intact.     Conjunctiva/sclera: Conjunctivae normal.     Pupils: Pupils are equal, round, and reactive to light.  Cardiovascular:     Rate and Rhythm: Normal rate and regular rhythm.     Pulses: Normal pulses.     Heart sounds: Normal heart sounds.  Pulmonary:     Effort: Pulmonary effort is normal.     Breath sounds: Normal breath sounds.  Musculoskeletal:        General: Normal range of motion.     Cervical back: Normal range of motion.  Skin:    General: Skin is warm and dry.     Capillary Refill: Capillary refill takes less than 2 seconds.     Findings: Erythema and rash present.       Neurological:     General: No focal deficit present.     Mental Status: She is alert and oriented to person, place, and time.  Psychiatric:        Mood and Affect: Mood normal.        Behavior: Behavior normal.        Thought Content: Thought content normal.        Judgment: Judgment normal.    BP (!) 131/48   Pulse 81   Ht 5' 1"  (1.549 m)   Wt 239 lb 3.2 oz (108.5 kg)   LMP 09/20/2019 (Exact Date)   SpO2 99%   BMI 45.20 kg/m   Lab Results  Component Value Date   WBC 16.5 (H) 10/18/2019   HGB 11.4 (L) 10/18/2019   HCT 35.8 (L) 10/18/2019   MCV 80.8 10/18/2019   PLT 277 10/18/2019   Lab Results  Component Value Date   NA 141 08/07/2020   K 4.4 08/07/2020   CO2 22 08/07/2020   GLUCOSE 126 (H) 08/07/2020   BUN 14 08/07/2020   CREATININE 0.64 08/07/2020   BILITOT 0.5 08/07/2020   ALKPHOS 138 (H) 08/07/2020   AST 98 (H) 08/07/2020   ALT 120 (H) 08/07/2020   PROT 7.9 08/07/2020   ALBUMIN 4.6 08/07/2020   CALCIUM 9.5 08/07/2020  ANIONGAP 9 10/18/2019   EGFR 112 08/07/2020    Lab Results  Component Value Date   CHOL 169 10/03/2019   Lab Results  Component Value Date   HDL 52 10/03/2019   Lab Results  Component Value Date   LDLCALC 103 (H) 10/03/2019   Lab Results  Component Value Date   TRIG 71 10/03/2019   Lab Results  Component Value Date   CHOLHDL 3.3 10/03/2019   Lab Results  Component Value Date   HGBA1C 7.9 (H) 08/07/2020       Assessment & Plan:   Problem List Items Addressed This Visit     Post-herpetic polyneuropathy   Relevant Medications   valACYclovir (VALTREX) 1000 MG tablet   gabapentin (NEURONTIN) 100 MG capsule   Shingles - Primary   Relevant Medications   valACYclovir (VALTREX) 1000 MG tablet   gabapentin (NEURONTIN) 100 MG capsule     Meds ordered this encounter  Medications   valACYclovir (VALTREX) 1000 MG tablet    Sig: Take 1 tablet (1,000 mg total) by mouth 3 (three) times daily for 7 days.    Dispense:  21 tablet    Refill:  0   gabapentin (NEURONTIN) 100 MG capsule    Sig: Take 1 tab (111m) up to every 8 hours during the day as needed for pain.  Take 3 tab (3071m at bedtime as needed for pain.    Dispense:  150 capsule    Refill:  3   Symptoms consistent with herpes zoster infection of the left breast. No signs of infection present.  Begin valacyclovir for treatment. Gabapentin may be used for neuropathic pain.  Recommend ice to area for additional pain control. F/U if symptoms worsen or fail to improve.     SaOrma RenderNP

## 2020-11-14 NOTE — Patient Instructions (Signed)
He enviado Valaciclovir a la farmacia. Tomar esto tres [employer] [place] [address]  He enviado gabapentina para Conservation officer, historic buildings. Puede tomar Renae Fickle cada 8 horas durante el da para el dolor y [place] tres pldoras a la hora de acostarse para Conservation officer, historic buildings. Puede tomarlo segn sea necesario y no tiene que tomarlo todos los das a menos que Risk analyst.    Est bien usar [employer] rea y, a veces, las compresas de hielo [place]

## 2020-11-19 ENCOUNTER — Other Ambulatory Visit: Payer: Self-pay

## 2020-12-12 ENCOUNTER — Ambulatory Visit (INDEPENDENT_AMBULATORY_CARE_PROVIDER_SITE_OTHER): Payer: BLUE CROSS/BLUE SHIELD | Admitting: Specialist

## 2020-12-12 ENCOUNTER — Other Ambulatory Visit: Payer: Self-pay

## 2020-12-12 ENCOUNTER — Ambulatory Visit: Payer: Self-pay

## 2020-12-12 ENCOUNTER — Encounter: Payer: Self-pay | Admitting: Specialist

## 2020-12-12 VITALS — BP 113/77 | HR 64 | Ht 61.0 in | Wt 239.0 lb

## 2020-12-12 DIAGNOSIS — IMO0002 Reserved for concepts with insufficient information to code with codable children: Secondary | ICD-10-CM

## 2020-12-12 DIAGNOSIS — M542 Cervicalgia: Secondary | ICD-10-CM

## 2020-12-12 DIAGNOSIS — M25519 Pain in unspecified shoulder: Secondary | ICD-10-CM

## 2020-12-12 DIAGNOSIS — M7580 Other shoulder lesions, unspecified shoulder: Secondary | ICD-10-CM

## 2020-12-12 DIAGNOSIS — M75102 Unspecified rotator cuff tear or rupture of left shoulder, not specified as traumatic: Secondary | ICD-10-CM

## 2020-12-12 DIAGNOSIS — M5412 Radiculopathy, cervical region: Secondary | ICD-10-CM

## 2020-12-12 DIAGNOSIS — M7542 Impingement syndrome of left shoulder: Secondary | ICD-10-CM

## 2020-12-12 MED ORDER — DICLOFENAC POTASSIUM 50 MG PO TABS
50.0000 mg | ORAL_TABLET | Freq: Three times a day (TID) | ORAL | 3 refills | Status: DC
  Filled 2020-12-12: qty 60, 20d supply, fill #0

## 2020-12-12 MED ORDER — GABAPENTIN 100 MG PO CAPS
100.0000 mg | ORAL_CAPSULE | Freq: Every day | ORAL | 2 refills | Status: DC
  Filled 2020-12-12 – 2020-12-20 (×2): qty 30, 30d supply, fill #0

## 2020-12-12 MED ORDER — DICLOFENAC SODIUM 50 MG PO TBEC
50.0000 mg | DELAYED_RELEASE_TABLET | Freq: Two times a day (BID) | ORAL | 1 refills | Status: DC
  Filled 2020-12-12 – 2020-12-20 (×2): qty 60, 30d supply, fill #0

## 2020-12-12 MED ORDER — DICLOFENAC SODIUM ER 100 MG PO TB24
100.0000 mg | ORAL_TABLET | Freq: Every day | ORAL | 0 refills | Status: DC
  Filled 2020-12-12: qty 30, 30d supply, fill #0

## 2020-12-12 NOTE — Patient Instructions (Signed)
Avoid overhead lifting and overhead use of the arms. Pillows to keep from sleeping directly on the shoulders Limited lifting to less than 10 lbs. Ice or heat for relief. NSAIDs are helpful, such as diclofenac, be careful not to use in excess as they place burdens on the kidney. Recommend taking the diclofenac for 2 weeks then as needed. Stretching exercise help and strengthening is helpful to build endurance.  Avoid overhead lifting and overhead use of the arms. Do not lift greater than 5 lbs. Adjust head rest in vehicle to prevent hyperextension if rear ended. Take extra precautions to avoid falling.

## 2020-12-12 NOTE — Progress Notes (Signed)
Office Visit Note   Patient: Alexandra Norris           Date of Birth: Aug 03, 1975           MRN: [id number] Visit Date: 12/12/2020              Requested by: Orma Render, NP [address] [place],  [place] [id number] PCP: Orma Render, NP   Assessment & Plan: Visit Diagnoses:  1. Radiculopathy of cervical spine   2. Cervicalgia   3. Shoulder pain, unspecified chronicity, unspecified laterality   4. Impingement syndrome of left shoulder   5. Disorder of rotator cuff syndrome of left shoulder and allied disorder     Plan: void overhead lifting and overhead use of the arms. Pillows to keep from sleeping directly on the shoulders Limited lifting to less than 10 lbs. Ice or heat for relief. NSAIDs are helpful, such as diclofenac, be careful not to use in excess as they place burdens on the kidney. Recommend taking the diclofenac for 2 weeks then as needed. Stretching exercise help and strengthening is helpful to build endurance.  Avoid overhead lifting and overhead use of the arms. Do not lift greater than 5 lbs. Adjust head rest in vehicle to prevent hyperextension if rear ended. Take extra precautions to avoid falling.  Follow-Up Instructions: Return in about 3 weeks (around 01/02/2021).   Orders:  Orders Placed This Encounter  Procedures   XR Cervical Spine 2 or 3 views   XR Shoulder Left   MR Cervical Spine w/o contrast   MR Shoulder Left w/o contrast   Meds ordered this encounter  Medications   gabapentin (NEURONTIN) 100 MG capsule    Sig: Take 1 capsule (100 mg total) by mouth at bedtime.    Dispense:  30 capsule    Refill:  2   diclofenac (CATAFLAM) 50 MG tablet    Sig: Take 1 tablet (50 mg total) by mouth 3 (three) times daily.    Dispense:  60 tablet    Refill:  3      Procedures: No procedures performed   Clinical Data: No additional findings.   Subjective: Chief Complaint  Patient presents with   Neck - Pain     45 year old right handed female with history of neck left shoulder pain and pain radiating into the left arm, left lateral upper arm and lateral elbow into the dorsal aspect of the left forearm. No bowel or bladder difficulty and no gait disturbance. She has pain into the left arm and shoulder. Pain with lying on the left side. History of previous anterior cervical spine surgery told she had 3 levels with disc problems but only had surgery on one. Pain with turning head to the left and extension of the neck. She feels weak in the left arm and is dropping items. Unable to drive for long periods.   Review of Systems  Constitutional: Negative.   HENT: Negative.    Eyes: Negative.   Respiratory: Negative.    Cardiovascular: Negative.   Gastrointestinal: Negative.   Endocrine: Negative.   Genitourinary: Negative.   Musculoskeletal: Negative.   Skin: Negative.   Allergic/Immunologic: Negative.   Neurological: Negative.   Hematological: Negative.   Psychiatric/Behavioral: Negative.      Objective: Vital Signs: BP 113/77 (BP Location: Left Arm, Patient Position: Sitting)   Pulse 64   Ht 5\' 1"  (1.549 m)   Wt 239 lb (108.4 kg)   LMP 09/20/2019 (  Exact Date)   BMI 45.16 kg/m   Physical Exam Constitutional:      Appearance: She is well-developed.  HENT:     Head: Normocephalic and atraumatic.  Eyes:     Pupils: Pupils are equal, round, and reactive to light.  Pulmonary:     Effort: Pulmonary effort is normal.     Breath sounds: Normal breath sounds.  Abdominal:     General: Bowel sounds are normal.     Palpations: Abdomen is soft.  Musculoskeletal:     Cervical back: Normal range of motion and neck supple.  Skin:    General: Skin is warm and dry.  Neurological:     Mental Status: She is alert and oriented to person, place, and time.  Psychiatric:        Behavior: Behavior normal.        Thought Content: Thought content normal.        Judgment: Judgment normal.   Back Exam    Tenderness  The patient is experiencing tenderness in the cervical.  Range of Motion  Extension:  60 abnormal  Flexion:  abnormal  Lateral bend right:  abnormal  Lateral bend left:  abnormal  Rotation right:  abnormal  Rotation left:  abnormal   Comments:  Weak in the left triceps, left finger extension and left wrist VF.    Specialty Comments:  No specialty comments available.  Imaging: No results found.   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   Shingles 11/14/2020   Encounter to establish care 09/09/2020   Fatty liver disease, nonalcoholic 81/82/9937   Elevated blood-pressure reading, without diagnosis of hypertension 06/17/2020   Influenza vaccine needed 04/03/2020   Cervical disc disorder with radiculopathy 04/03/2020   S/P carpal tunnel release 08/30/2019   Right carpal tunnel syndrome 07/11/2019   Left carpal tunnel syndrome 07/11/2019   Arthritis    Anemia    Seasonal allergies 10/08/2016   Nasal obstruction 02/18/2016   DM2 (diabetes mellitus, type 2) ([place]) 10/21/2015   Bilateral chronic knee pain 09/25/2015   GASTROESOPHAGEAL REFLUX DISEASE 08/14/2009   DEPRESSION, SITUATIONAL, PROLONGED 02/05/2009   OBESITY 05/30/2007   Moderate persistent asthma with acute exacerbation 12/06/2006   Past Medical History:  Diagnosis Date   Anemia    Anxiety    Arthritis    Asthma    Carpal tunnel syndrome    Depression    Dyspnea    Endometriosis    History of blood transfusion 1999   w/vaginal delivery   History of bronchitis    "I've stayed in hospital 2 X w/bronchitis"   Migraine    Pneumonia    PONV (postoperative nausea and vomiting)    Pre-diabetes 10/03/2019   A1C 6.4    Family History  Problem Relation Age of Onset   Diabetes Mother    Osteoarthritis Mother    Hypertension Mother    Hyperlipidemia Mother    Arthritis Mother     Past Surgical History:  Procedure  Laterality Date   ABDOMINAL HYSTERECTOMY  10/17/2019   APPENDECTOMY     CARPAL TUNNEL RELEASE Right 07/19/2019   Procedure: RIGHT CARPAL TUNNEL RELEASE;  Surgeon: Leandrew Koyanagi, MD;  Location: [facility];  Service: Orthopedics;  Laterality: Right;   CARPAL TUNNEL RELEASE Left 09/13/2019   Procedure: LEFT CARPAL TUNNEL RELEASE;  Surgeon: Leandrew Koyanagi, MD;  Location: [facility];  Service: Orthopedics;  Laterality: Left;  Bier block   CESAREAN SECTION  1997; 2008   CHOLECYSTECTOMY  2011   TONSILLECTOMY     tonsilletomy     TUBAL LIGATION  04/2007   VAGINAL HYSTERECTOMY Bilateral 10/17/2019   Procedure: HYSTERECTOMY VAGINAL WITH SALPINGECTOMY;  Surgeon: Chancy Milroy, MD;  Location: [facility];  Service: Gynecology;  Laterality: Bilateral;   Social History   Occupational History   Not on file  Tobacco Use   Smoking status: Never   Smokeless tobacco: Never  Vaping Use   Vaping Use: Never used  Substance and Sexual Activity   Alcohol use: No   Drug use: No   Sexual activity: Yes    Birth control/protection: Surgical

## 2020-12-13 ENCOUNTER — Ambulatory Visit (INDEPENDENT_AMBULATORY_CARE_PROVIDER_SITE_OTHER): Payer: BC Managed Care – PPO | Admitting: Nurse Practitioner

## 2020-12-13 ENCOUNTER — Encounter (HOSPITAL_BASED_OUTPATIENT_CLINIC_OR_DEPARTMENT_OTHER): Payer: Self-pay

## 2020-12-13 ENCOUNTER — Other Ambulatory Visit: Payer: Self-pay

## 2020-12-13 DIAGNOSIS — Z5329 Procedure and treatment not carried out because of patient's decision for other reasons: Secondary | ICD-10-CM

## 2020-12-13 NOTE — Progress Notes (Signed)
No show 12/13/2020- DM f/U

## 2020-12-16 ENCOUNTER — Encounter (HOSPITAL_BASED_OUTPATIENT_CLINIC_OR_DEPARTMENT_OTHER): Payer: Self-pay | Admitting: Nurse Practitioner

## 2020-12-20 ENCOUNTER — Other Ambulatory Visit: Payer: Self-pay

## 2020-12-20 ENCOUNTER — Encounter: Payer: Self-pay | Admitting: Physical Medicine and Rehabilitation

## 2020-12-20 ENCOUNTER — Ambulatory Visit (INDEPENDENT_AMBULATORY_CARE_PROVIDER_SITE_OTHER): Payer: BC Managed Care – PPO | Admitting: Physical Medicine and Rehabilitation

## 2020-12-20 DIAGNOSIS — R202 Paresthesia of skin: Secondary | ICD-10-CM

## 2020-12-20 NOTE — Progress Notes (Signed)
Pain and numbness in right arm. Worse at night. Right hand dominant No  lotion per patient Numeric Pain Rating Scale and Functional Assessment Average Pain 6   In the last MONTH (on 0-10 scale) has pain interfered with the following?  1. General activity like being  able to carry out your everyday physical activities such as walking, climbing stairs, carrying groceries, or moving a chair?  Rating(8)

## 2020-12-23 NOTE — Progress Notes (Signed)
Alexandra Norris - 45 y.o. female MRN [id number]  Date of birth: [birth year]  Office Visit Note: Visit Date: 12/20/2020 PCP: Orma Render, NP Referred by: Orma Render, NP  Subjective: Chief Complaint  Patient presents with   Right Hand - Numbness, Pain   Right Arm - Numbness, Pain   HPI:  Alexandra Norris is a 45 y.o. female who comes in today at the request of Dr. Eduard Roux for electrodiagnostic study of the Right upper extremities.  Patient is Right hand dominant.  She reports recalcitrant pain and numbness in the right arm somewhat nondermatomal.  She rates her pain as a 6 out of 10.  She does endorse some symptoms into the radial digits.  Her history is interesting that she has had carpal tunnel release by Dr. Erlinda Hong, with the right in February 2021 in the left in April 2021.  Prior to that she was seeing [facility] doctors including orthopedics.  Electrodiagnostic study was performed but I cannot find any report of that or any interpretation of the report other than to say it did show carpal tunnel syndrome.  Her case is complicated by diabetes as well and has history of C5-6 ACDF performed post carpal tunnel releases.  I believe this was performed by Dr. Kathyrn Sheriff.  She is now seeing Dr. Basil Dess for evaluation.  ROS Otherwise per HPI.  Assessment & Plan: Visit Diagnoses:    ICD-10-CM   1. Paresthesia of skin  R20.2 NCV with EMG (electromyography)      Plan: Impression: Essentially NORMAL electrodiagnostic study of the right upper limb.  There is no significant electrodiagnostic evidence of nerve entrapment, brachial plexopathy or cervical radiculopathy.  As you know, purely sensory or demyelinating radiculopathies and chemical radiculitis may not be detected with this particular electrodiagnostic study.  Recommendations: 1.  Follow-up with referring physician. 2.  Continue current management of symptoms.  Meds & Orders: No orders of the defined  types were placed in this encounter.   Orders Placed This Encounter  Procedures   NCV with EMG (electromyography)    Follow-up: Return for  Eduard Roux, MD.   Procedures: No procedures performed  EMG & NCV Findings: All nerve conduction studies (as indicated in the following tables) were within normal limits.    All examined muscles (as indicated in the following table) showed no evidence of electrical instability.    Impression: Essentially NORMAL electrodiagnostic study of the right upper limb.  There is no significant electrodiagnostic evidence of nerve entrapment, brachial plexopathy or cervical radiculopathy.  As you know, purely sensory or demyelinating radiculopathies and chemical radiculitis may not be detected with this particular electrodiagnostic study.  Recommendations: 1.  Follow-up with referring physician. 2.  Continue current management of symptoms.  ___________________________ Laurence Spates FAAPMR Board Certified, American Board of Physical Medicine and Rehabilitation    Nerve Conduction Studies Anti Sensory Summary Table   Stim Site NR Peak (ms) Norm Peak (ms) P-T Amp (V) Norm P-T Amp Site1 Site2 Delta-P (ms) Dist (cm) Vel (m/s) Norm Vel (m/s)  Right Median Acr Palm Anti Sensory (2nd Digit)  30.2C  Wrist    3.2 <3.6 33.1 >10 Wrist Palm 1.6 0.0    Palm    1.6 <2.0 30.2         Right Radial Anti Sensory (Base 1st Digit)  30.1C  Wrist    2.0 <3.1 28.8  Wrist Base 1st Digit 2.0 0.0    Right Ulnar Anti Sensory (5th Digit)  30.4C  Wrist    3.1 <3.7 19.2 >15.0 Wrist 5th Digit 3.1 14.0 45 >38   Motor Summary Table   Stim Site NR Onset (ms) Norm Onset (ms) O-P Amp (mV) Norm O-P Amp Site1 Site2 Delta-0 (ms) Dist (cm) Vel (m/s) Norm Vel (m/s)  Right Median Motor (Abd Poll Brev)  30.1C  Wrist    3.1 <4.2 10.4 >5 Elbow Wrist 3.7 18.5 50 >50  Elbow    6.8  9.5         Right Ulnar Motor (Abd Dig Min)  30.3C  Wrist    2.9 <4.2 8.8 >3 B Elbow Wrist 2.7 18.5 69 >53   B Elbow    5.6  7.9  A Elbow B Elbow 1.3 10.0 77 >53  A Elbow    6.9  7.8          EMG   Side Muscle Nerve Root Ins Act Fibs Psw Amp Dur Poly Recrt Int Fraser Din Comment  Right 1stDorInt Ulnar C8-T1 Nml Nml Nml Nml Nml 0 Nml Nml   Right Abd Poll Brev Median C8-T1 Nml Nml Nml Nml Nml 0 Nml Nml   Right ExtDigCom   Nml Nml Nml Nml Nml 0 Nml Nml   Right Triceps Radial C6-7-8 Nml Nml Nml Nml Nml 0 Nml Nml   Right Deltoid Axillary C5-6 Nml Nml Nml Nml Nml 0 Nml Nml     Nerve Conduction Studies Anti Sensory Left/Right Comparison   Stim Site L Lat (ms) R Lat (ms) L-R Lat (ms) L Amp (V) R Amp (V) L-R Amp (%) Site1 Site2 L Vel (m/s) R Vel (m/s) L-R Vel (m/s)  Median Acr Palm Anti Sensory (2nd Digit)  30.2C  Wrist  3.2   33.1  Wrist Palm     Palm  1.6   30.2        Radial Anti Sensory (Base 1st Digit)  30.1C  Wrist  2.0   28.8  Wrist Base 1st Digit     Ulnar Anti Sensory (5th Digit)  30.4C  Wrist  3.1   19.2  Wrist 5th Digit  45    Motor Left/Right Comparison   Stim Site L Lat (ms) R Lat (ms) L-R Lat (ms) L Amp (mV) R Amp (mV) L-R Amp (%) Site1 Site2 L Vel (m/s) R Vel (m/s) L-R Vel (m/s)  Median Motor (Abd Poll Brev)  30.1C  Wrist  3.1   10.4  Elbow Wrist  50   Elbow  6.8   9.5        Ulnar Motor (Abd Dig Min)  30.3C  Wrist  2.9   8.8  B Elbow Wrist  69   B Elbow  5.6   7.9  A Elbow B Elbow  77   A Elbow  6.9   7.8           Waveforms:            Clinical History: MRI CERVICAL SPINE WITHOUT CONTRAST   TECHNIQUE: Multiplanar, multisequence MR imaging of the cervical spine was performed. No intravenous contrast was administered.   COMPARISON:  Plain films January 16, 2020.   FINDINGS: The study is partially degraded by susceptibility artifact from dental hardware.   Alignment: Mild reversal of the cervical curvature.   Vertebrae: No fracture, evidence of discitis, or bone lesion.   Cord: Normal signal and morphology.   Posterior Fossa, vertebral arteries,  paraspinal tissues: Negative.   Disc levels:   C2-3: No spinal canal  or neural foraminal stenosis.   C3-4: Small posterior disc protrusion resulting in mild spinal canal stenosis. Uncovertebral and facet degenerative changes resulting in mild left neural foraminal narrowing.   C4-5: Small posterior disc protrusion resulting in mild spinal canal stenosis. Uncovertebral and facet degenerative changes resulting in mild left neural foraminal narrowing.   C5-6: Posterior disc protrusion resulting in mild spinal canal stenosis. Uncovertebral and facet degenerative changes resulting in mild-to-moderate right and mild left neural foraminal narrowing.   C7-T1: Posterior disc protrusion resulting in mild spinal canal stenosis. Uncovertebral and facet degenerative changes without significant neural foraminal narrowing.   C7-T1. Facet degenerative changes resulting in mild left neural foraminal narrowing. No spinal canal stenosis.   IMPRESSION: 1. Mild multilevel degenerative changes of the cervical spine as described above, with mild spinal canal stenosis from C3-4 through C7-T1. 2. Mild-to-moderate right and mild left neural foraminal narrowing at C5-6.     Electronically Signed   By: Pedro Earls M.D.   On: 03/29/2020 12:26     Objective:  VS:  HT:    WT:   BMI:     BP:   HR: bpm  TEMP: ( )  RESP:  Physical Exam Musculoskeletal:        General: No swelling, tenderness or deformity.     Comments: Inspection reveals no atrophy of the bilateral APB or FDI or hand intrinsics. There is no swelling, color changes, allodynia or dystrophic changes. There is 5 out of 5 strength in the bilateral wrist extension, finger abduction and long finger flexion. There is intact sensation to light touch in all dermatomal and peripheral nerve distributions. There is a negative Phalen's test bilaterally. There is a negative Hoffmann's test bilaterally.  Skin:    General: Skin is  warm and dry.     Findings: No erythema or rash.  Neurological:     General: No focal deficit present.     Mental Status: She is alert and oriented to person, place, and time.     Motor: No weakness or abnormal muscle tone.     Coordination: Coordination normal.  Psychiatric:        Mood and Affect: Mood normal.        Behavior: Behavior normal.     Imaging: No results found.

## 2020-12-24 NOTE — Procedures (Signed)
EMG & NCV Findings: All nerve conduction studies (as indicated in the following tables) were within normal limits.    All examined muscles (as indicated in the following table) showed no evidence of electrical instability.    Impression: Essentially NORMAL electrodiagnostic study of the right upper limb.  There is no significant electrodiagnostic evidence of nerve entrapment, brachial plexopathy or cervical radiculopathy.  As you know, purely sensory or demyelinating radiculopathies and chemical radiculitis may not be detected with this particular electrodiagnostic study.  Recommendations: 1.  Follow-up with referring physician. 2.  Continue current management of symptoms.  ___________________________ Laurence Spates FAAPMR Board Certified, American Board of Physical Medicine and Rehabilitation    Nerve Conduction Studies Anti Sensory Summary Table   Stim Site NR Peak (ms) Norm Peak (ms) P-T Amp (V) Norm P-T Amp Site1 Site2 Delta-P (ms) Dist (cm) Vel (m/s) Norm Vel (m/s)  Right Median Acr Palm Anti Sensory (2nd Digit)  30.2C  Wrist    3.2 <3.6 33.1 >10 Wrist Palm 1.6 0.0    Palm    1.6 <2.0 30.2         Right Radial Anti Sensory (Base 1st Digit)  30.1C  Wrist    2.0 <3.1 28.8  Wrist Base 1st Digit 2.0 0.0    Right Ulnar Anti Sensory (5th Digit)  30.4C  Wrist    3.1 <3.7 19.2 >15.0 Wrist 5th Digit 3.1 14.0 45 >38   Motor Summary Table   Stim Site NR Onset (ms) Norm Onset (ms) O-P Amp (mV) Norm O-P Amp Site1 Site2 Delta-0 (ms) Dist (cm) Vel (m/s) Norm Vel (m/s)  Right Median Motor (Abd Poll Brev)  30.1C  Wrist    3.1 <4.2 10.4 >5 Elbow Wrist 3.7 18.5 50 >50  Elbow    6.8  9.5         Right Ulnar Motor (Abd Dig Min)  30.3C  Wrist    2.9 <4.2 8.8 >3 B Elbow Wrist 2.7 18.5 69 >53  B Elbow    5.6  7.9  A Elbow B Elbow 1.3 10.0 77 >53  A Elbow    6.9  7.8          EMG   Side Muscle Nerve Root Ins Act Fibs Psw Amp Dur Poly Recrt Int Fraser Din Comment  Right 1stDorInt Ulnar C8-T1 Nml  Nml Nml Nml Nml 0 Nml Nml   Right Abd Poll Brev Median C8-T1 Nml Nml Nml Nml Nml 0 Nml Nml   Right ExtDigCom   Nml Nml Nml Nml Nml 0 Nml Nml   Right Triceps Radial C6-7-8 Nml Nml Nml Nml Nml 0 Nml Nml   Right Deltoid Axillary C5-6 Nml Nml Nml Nml Nml 0 Nml Nml     Nerve Conduction Studies Anti Sensory Left/Right Comparison   Stim Site L Lat (ms) R Lat (ms) L-R Lat (ms) L Amp (V) R Amp (V) L-R Amp (%) Site1 Site2 L Vel (m/s) R Vel (m/s) L-R Vel (m/s)  Median Acr Palm Anti Sensory (2nd Digit)  30.2C  Wrist  3.2   33.1  Wrist Palm     Palm  1.6   30.2        Radial Anti Sensory (Base 1st Digit)  30.1C  Wrist  2.0   28.8  Wrist Base 1st Digit     Ulnar Anti Sensory (5th Digit)  30.4C  Wrist  3.1   19.2  Wrist 5th Digit  45    Motor Left/Right Comparison   Stim  Site L Lat (ms) R Lat (ms) L-R Lat (ms) L Amp (mV) R Amp (mV) L-R Amp (%) Site1 Site2 L Vel (m/s) R Vel (m/s) L-R Vel (m/s)  Median Motor (Abd Poll Brev)  30.1C  Wrist  3.1   10.4  Elbow Wrist  50   Elbow  6.8   9.5        Ulnar Motor (Abd Dig Min)  30.3C  Wrist  2.9   8.8  B Elbow Wrist  69   B Elbow  5.6   7.9  A Elbow B Elbow  77   A Elbow  6.9   7.8           Waveforms:

## 2020-12-29 ENCOUNTER — Ambulatory Visit
Admission: RE | Admit: 2020-12-29 | Discharge: 2020-12-29 | Disposition: A | Discharge status: Home / Self Care | Payer: BC Managed Care – PPO | Source: Ambulatory Visit | Attending: Specialist | Admitting: Specialist

## 2020-12-29 DIAGNOSIS — M5412 Radiculopathy, cervical region: Secondary | ICD-10-CM

## 2020-12-29 DIAGNOSIS — M25519 Pain in unspecified shoulder: Secondary | ICD-10-CM

## 2020-12-29 DIAGNOSIS — M75102 Unspecified rotator cuff tear or rupture of left shoulder, not specified as traumatic: Secondary | ICD-10-CM

## 2020-12-29 DIAGNOSIS — M542 Cervicalgia: Secondary | ICD-10-CM

## 2020-12-29 DIAGNOSIS — IMO0002 Reserved for concepts with insufficient information to code with codable children: Secondary | ICD-10-CM

## 2020-12-29 DIAGNOSIS — M7542 Impingement syndrome of left shoulder: Secondary | ICD-10-CM

## 2020-12-29 IMAGING — MR MR CERVICAL SPINE W/O CM
4 of 5 series · 29 of 48 positions shown · non-contrast
Comparison: [DATE]

CLINICAL DATA: Cervicalgia. Cervical radiculopathy. Left C7
weakness. Prior cervical spine surgery.

EXAM:
MRI CERVICAL SPINE WITHOUT CONTRAST
TECHNIQUE: Multiplanar, multisequence MR imaging of the cervical spine was
performed. No intravenous contrast was administered.

[Series 2: T2 · sagittal · 3.0mm · 0.66mm/px · 8 of 18 slices shown (1 of 2)]
[im 1/18]
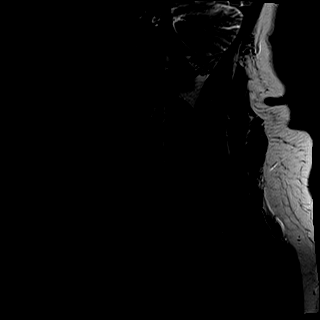
[im 3/18]
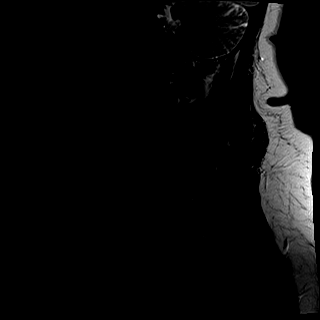
[im 5/18]
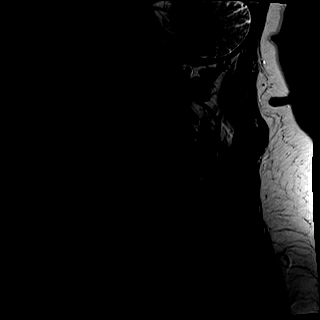
[im 8/18]
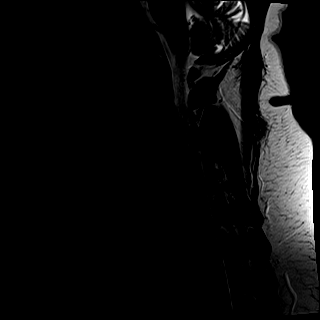
[im 10/18]
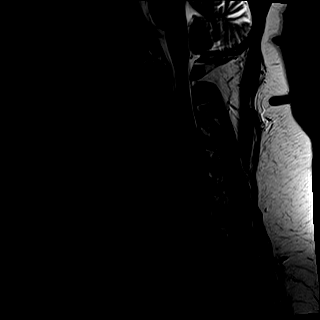
[im 13/18]
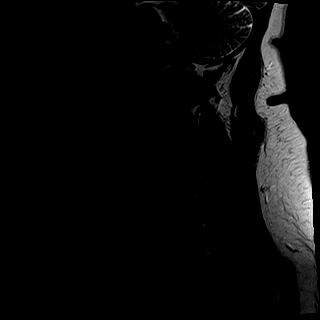
[im 15/18]
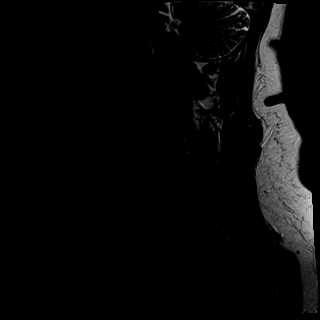
[im 18/18]
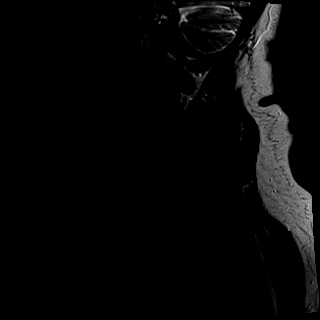

[Series 3: T1 · sagittal · 3.0mm · 0.41mm/px · 8 of 18 slices shown]
[im 1/18]
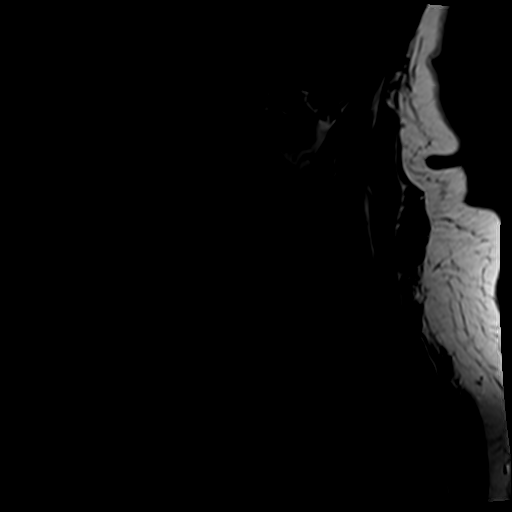
[im 3/18]
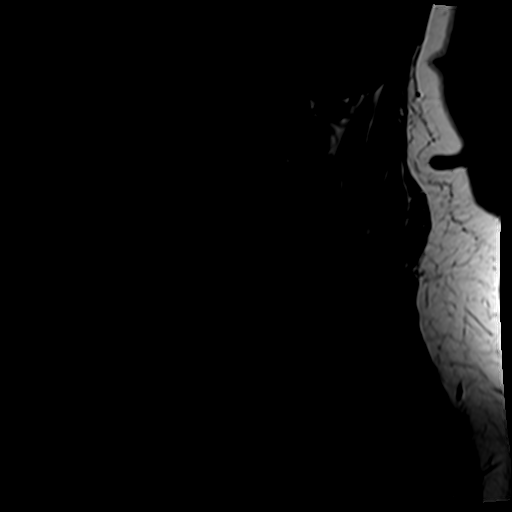
[im 5/18]
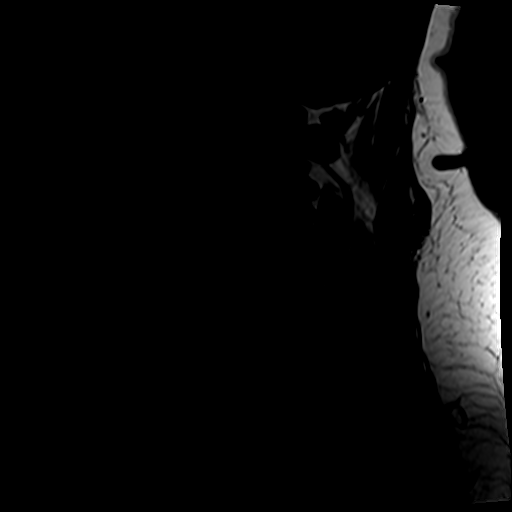
[im 8/18]
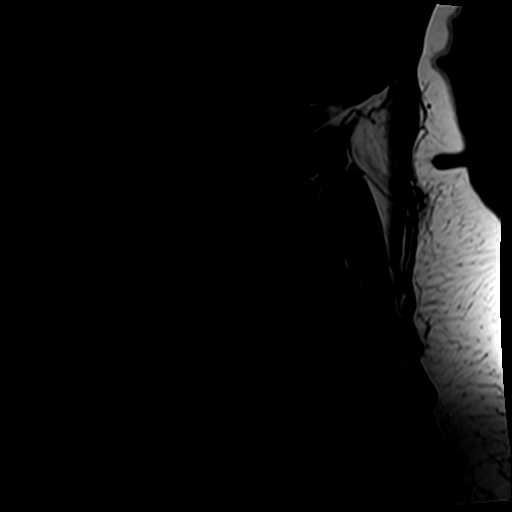
[im 10/18]
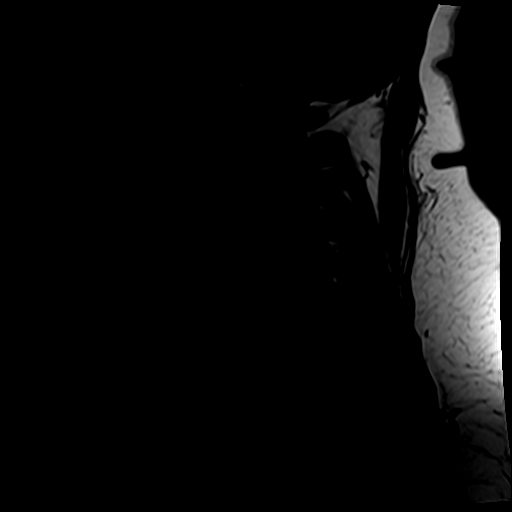
[im 13/18]
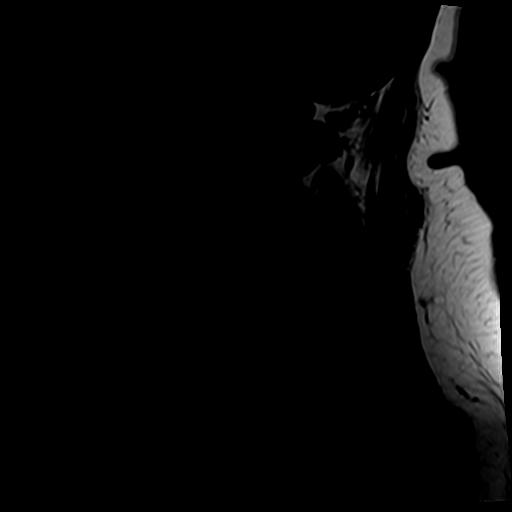
[im 15/18]
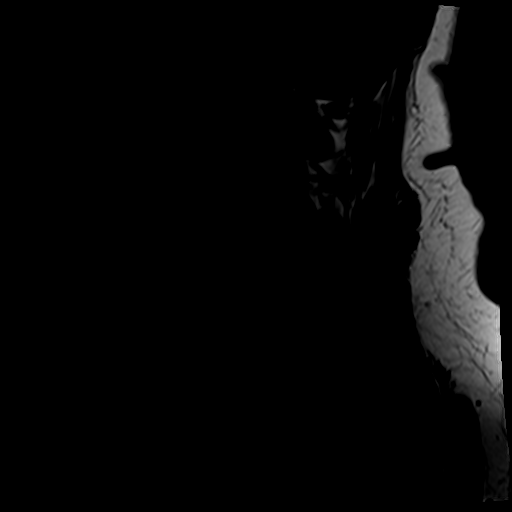
[im 18/18]
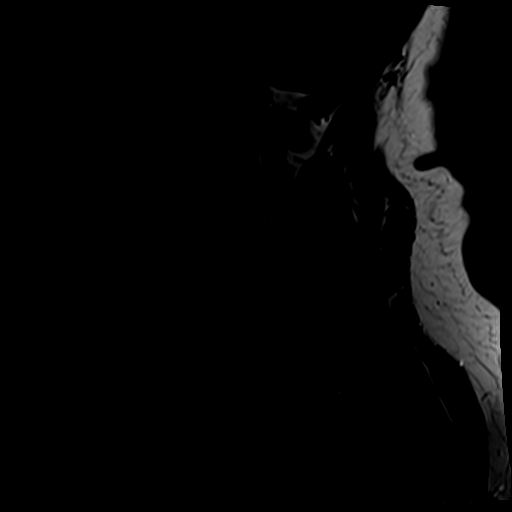

[Series 4: tir sag · sagittal · 3.0mm · 0.41mm/px · 4 of 18 slices shown]
[im 1/18]
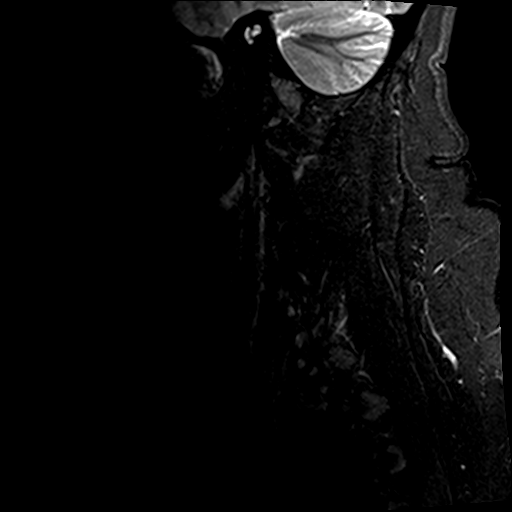
[im 3/18]
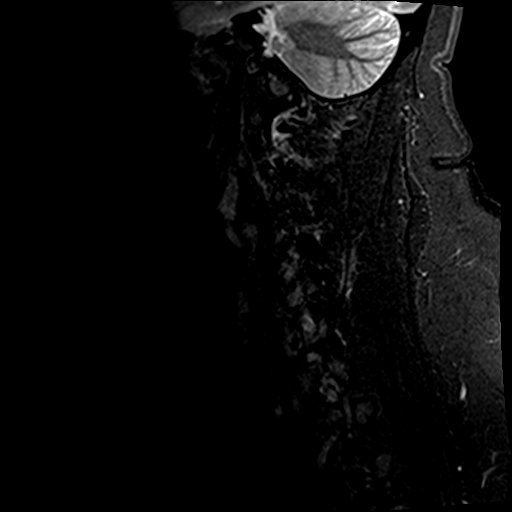
[im 10/18]
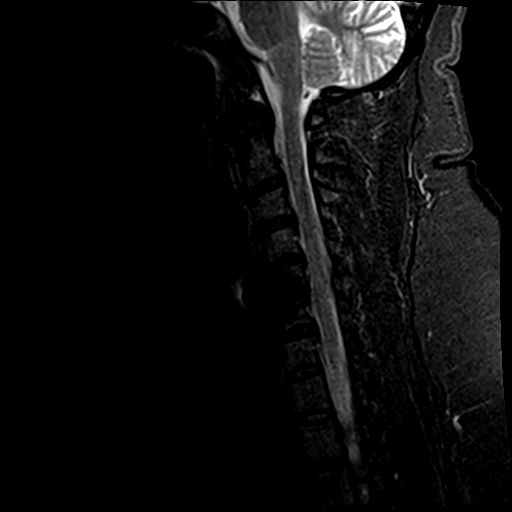
[im 15/18]
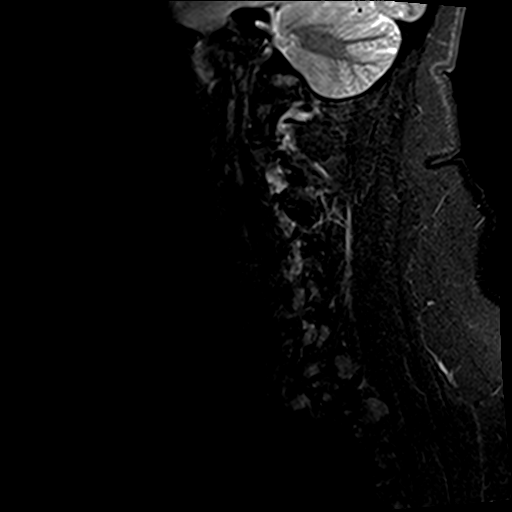

[Series 6: T2 · axial · 3.0mm · 0.70mm/px · z∈[-49,+47]mm · 9 of 27 slices shown (2 of 2)]
[im 1/27]
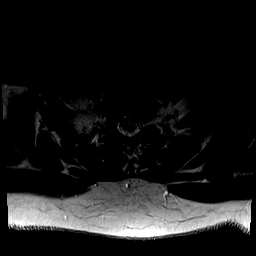
[im 5/27]
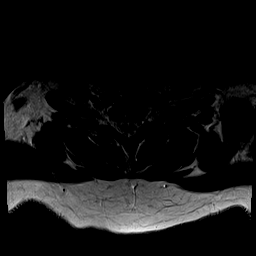
[im 8/27]
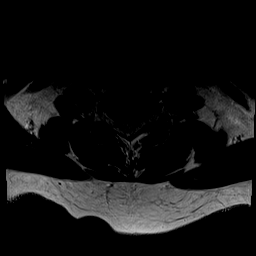
[im 12/27]
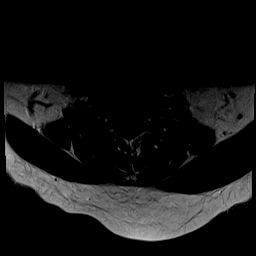
[im 15/27]
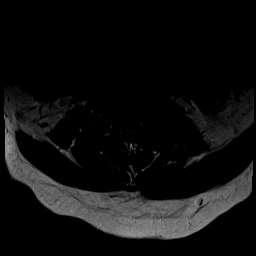
[im 19/27]
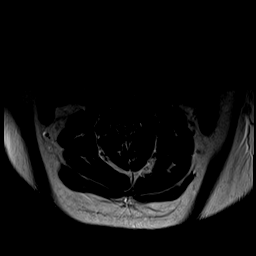
[im 22/27]
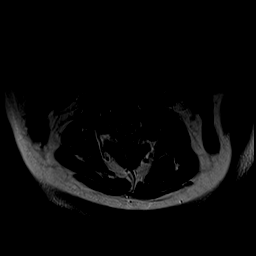
[im 24/27]
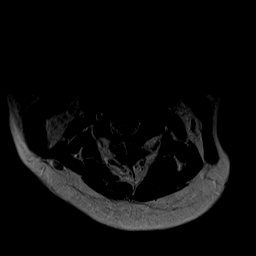
[im 27/27]
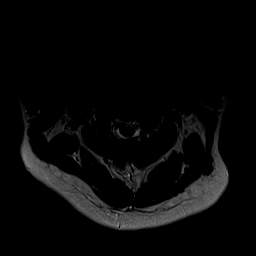

[29 of 48 positions shown; findings below may reference images not displayed]

FINDINGS: Some sequences are mildly to moderately motion degraded.

Alignment: Chronic slight reversal of the normal cervical lordosis.
No listhesis.

Vertebrae: No fracture, suspicious marrow lesion, or significant
marrow edema. Interval C5-6 ACDF.

Cord: Limited assessment due to motion. No definite signal
abnormality identified.

Posterior Fossa, vertebral arteries, paraspinal tissues:
Unremarkable.

Disc levels:

C2-3: Moderate left facet arthrosis results in borderline to mild
left neural foraminal stenosis, unchanged. No spinal stenosis.

C3-4: A small central disc protrusion, mild uncovertebral spurring,
and mild right and moderate left facet arthrosis result in mild
spinal stenosis and mild left neural foraminal stenosis, unchanged.

C4-5: A small central left paracentral disc protrusion, mild
uncovertebral spurring, and mild-to-moderate left facet arthrosis
result in mild spinal stenosis and mild left neural foraminal
stenosis, unchanged.

C5-6: Interval ACDF. Patent spinal canal. Suspected mild residual
medial right neural foraminal stenosis due to spurring.

C6-7: Mild disc bulging, a small central disc protrusion, and
uncovertebral spurring result in mild spinal stenosis without neural
foraminal stenosis, unchanged.

C7-T1: Mild left facet arthrosis without disc herniation or
significant stenosis, unchanged.
IMPRESSION: 1. Interval C5-6 ACDF without significant residual spinal stenosis.
2. Unchanged mild spinal stenosis and mild left neural foraminal
stenosis at C3-4 and C4-5.
3. Unchanged mild spinal stenosis at C6-7.

## 2020-12-29 IMAGING — MR MR SHOULDER*L* W/O CM
5 series · 35 of 40 positions shown · non-contrast
Comparison: None.

CLINICAL DATA: Left shoulder pain for 2 years.

EXAM:
MRI OF THE LEFT SHOULDER WITHOUT CONTRAST
TECHNIQUE: Multiplanar, multisequence MR imaging of the shoulder was performed.
No intravenous contrast was administered.

[Series 3: T2 fat-sat · axial · 4.0mm · 0.55mm/px · z∈[-100,+13]mm · 9 of 26 slices shown (1 of 3)]
[im 1/26]
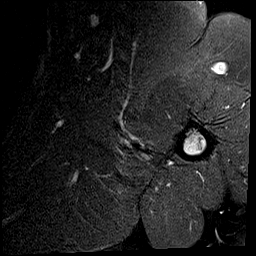
[im 5/26]
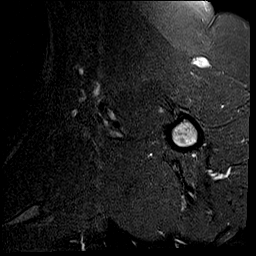
[im 7/26]
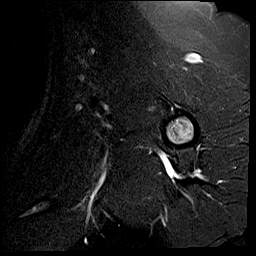
[im 12/26]
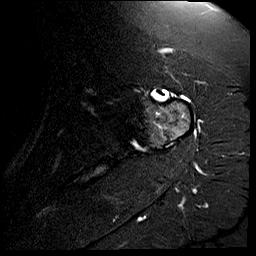
[im 14/26]
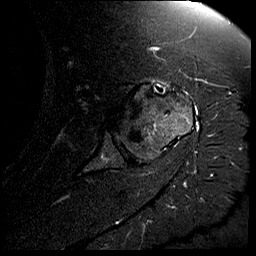
[im 19/26]
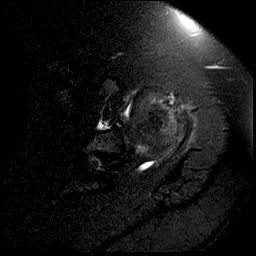
[im 21/26]
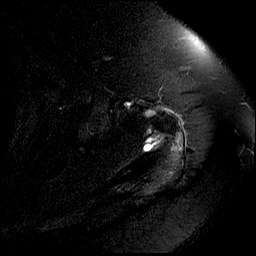
[im 23/26]
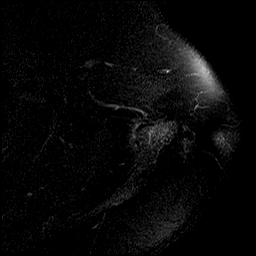
[im 26/26]
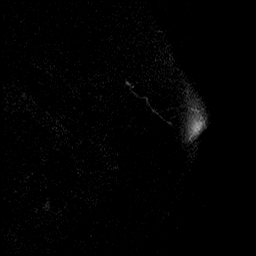

[Series 4: T2 fat-sat · oblique · 4.0mm · 0.55mm/px · 6 of 16 slices shown (2 of 3)]
[im 1/16]
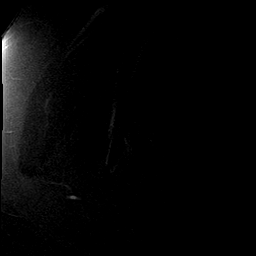
[im 4/16]
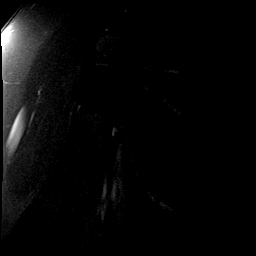
[im 7/16]
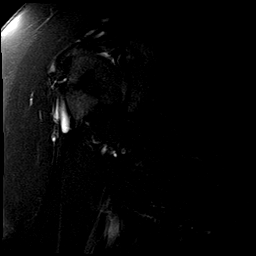
[im 10/16]
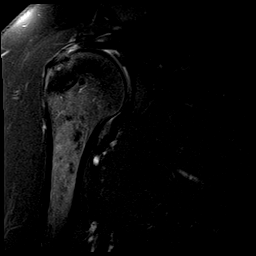
[im 13/16]
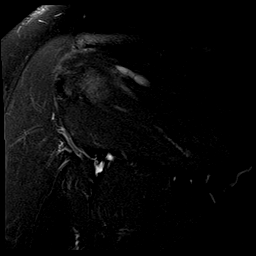
[im 16/16]
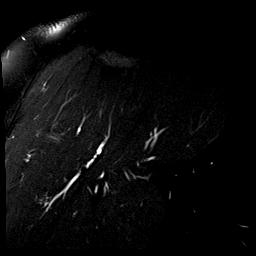

[Series 5: PD · oblique · 4.0mm · 0.27mm/px · 6 of 16 slices shown]
[im 1/16]
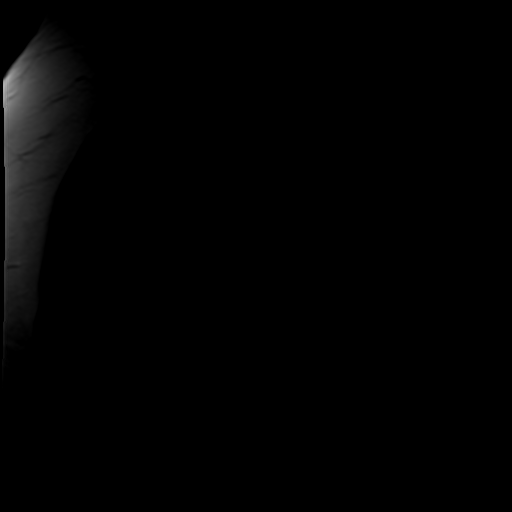
[im 4/16]
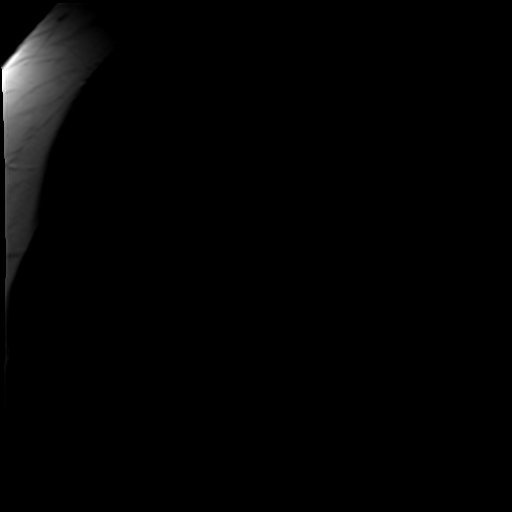
[im 7/16]
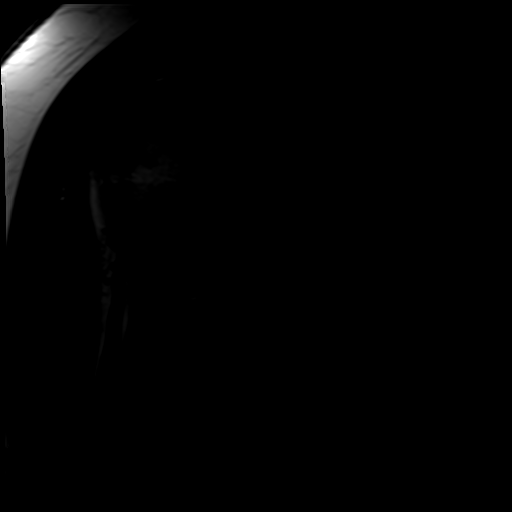
[im 10/16]
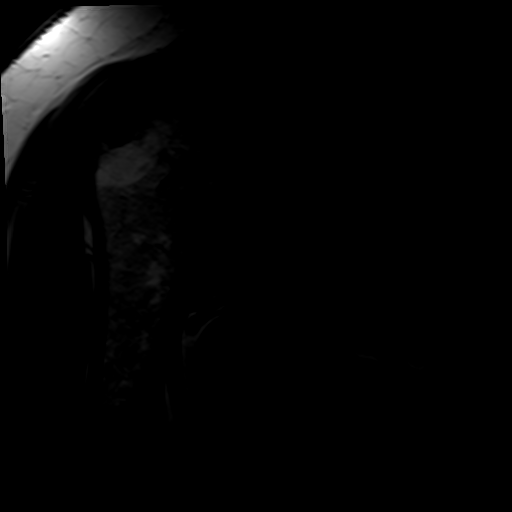
[im 13/16]
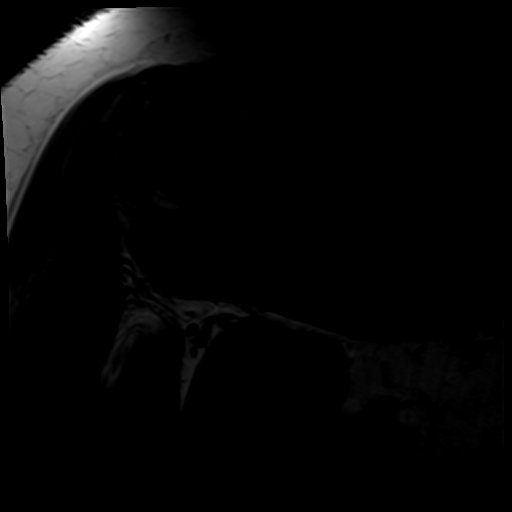
[im 16/16]
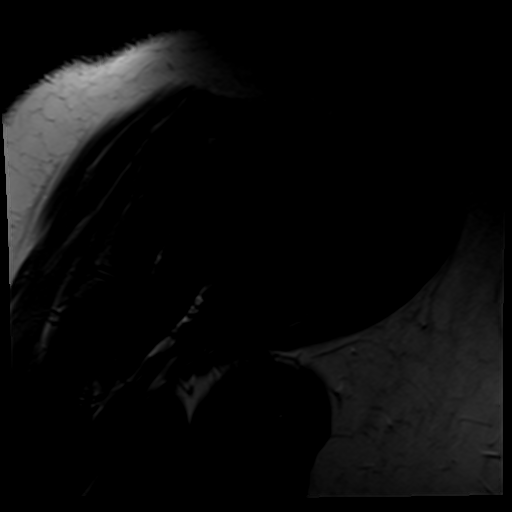

[Series 6: T2 fat-sat · coronal · 4.0mm · 0.55mm/px · 8 of 20 slices shown (3 of 3)]
[im 1/20]
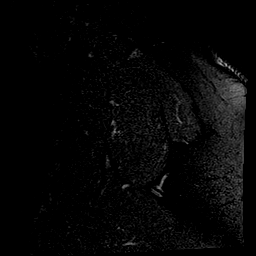
[im 3/20]
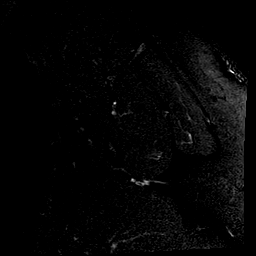
[im 6/20]
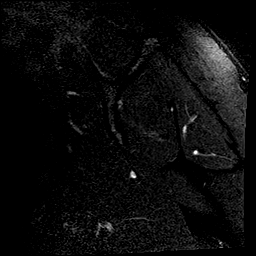
[im 9/20]
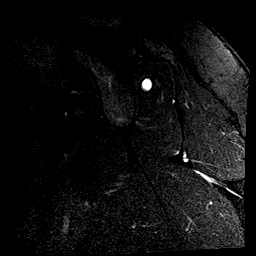
[im 11/20]
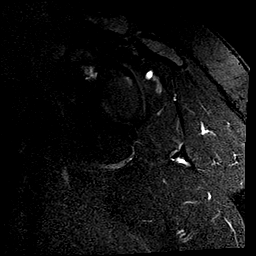
[im 14/20]
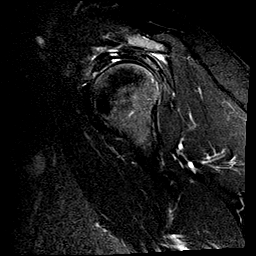
[im 17/20]
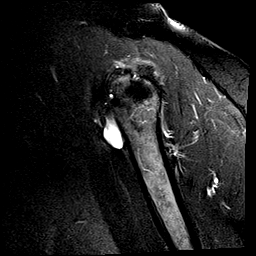
[im 20/20]
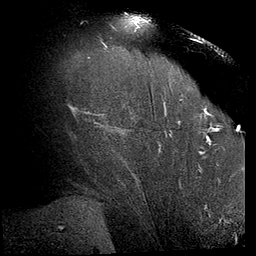

[Series 7: T1 · coronal · 4.0mm · 0.27mm/px · 6 of 21 slices shown]
[im 1/21]
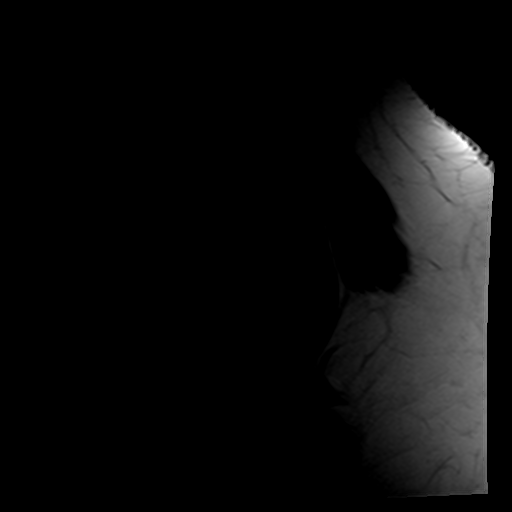
[im 3/21]
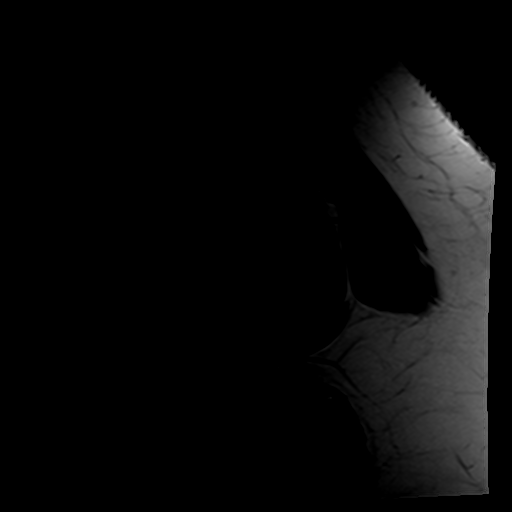
[im 6/21]
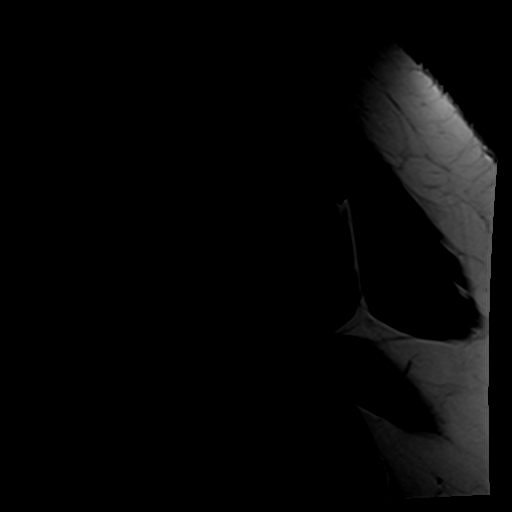
[im 9/21]
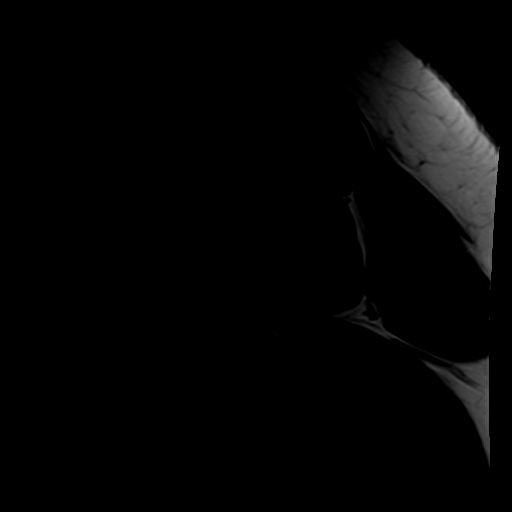
[im 12/21]
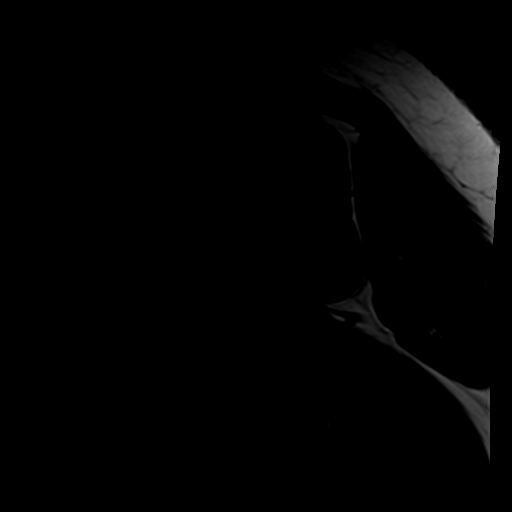
[im 15/21]
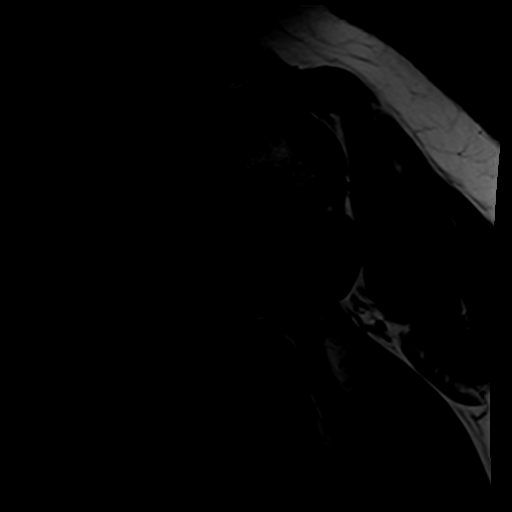

[35 of 40 positions shown; findings below may reference images not displayed]

FINDINGS: Rotator cuff: Mild tendinosis of the supraspinatus tendon with a
high-grade partial-thickness articular surface tear and a tiny
full-thickness component. Mild tendinosis of the infraspinatus
tendon. Teres minor tendon is intact. Subscapularis tendon is
intact.

Muscles: No muscle atrophy or edema. Small intramuscular ganglion
cyst at the musculotendinous junction of the infraspinatus. No
intramuscular fluid collection or hematoma.

Biceps Long Head: Intact.

Acromioclavicular Joint: Mild arthropathy of the acromioclavicular
joint. No subacromial/subdeltoid bursal fluid.

Glenohumeral Joint: No joint effusion. No chondral defect.

Labrum: Grossly intact, but evaluation is limited by lack of
intraarticular fluid/contrast.

Bones: No fracture or dislocation. No aggressive osseous lesion.

Other: No fluid collection or hematoma.
IMPRESSION: 1. Mild tendinosis of the supraspinatus tendon with a high-grade
partial-thickness articular surface tear and a tiny full-thickness
component.
2. Mild tendinosis of the infraspinatus tendon.

## 2021-01-03 ENCOUNTER — Other Ambulatory Visit: Payer: Self-pay

## 2021-01-03 ENCOUNTER — Other Ambulatory Visit (HOSPITAL_COMMUNITY): Payer: Self-pay

## 2021-01-03 ENCOUNTER — Ambulatory Visit (HOSPITAL_COMMUNITY)
Admission: EM | Admit: 2021-01-03 | Discharge: 2021-01-03 | Disposition: A | Discharge status: Home / Self Care | Payer: BC Managed Care – PPO | Attending: Medical Oncology | Admitting: Medical Oncology

## 2021-01-03 ENCOUNTER — Ambulatory Visit (INDEPENDENT_AMBULATORY_CARE_PROVIDER_SITE_OTHER): Payer: BC Managed Care – PPO | Admitting: Orthopaedic Surgery

## 2021-01-03 ENCOUNTER — Encounter (HOSPITAL_COMMUNITY): Payer: Self-pay

## 2021-01-03 ENCOUNTER — Encounter: Payer: Self-pay | Admitting: Orthopaedic Surgery

## 2021-01-03 VITALS — Ht 61.0 in | Wt 239.0 lb

## 2021-01-03 DIAGNOSIS — G5621 Lesion of ulnar nerve, right upper limb: Secondary | ICD-10-CM

## 2021-01-03 DIAGNOSIS — M75102 Unspecified rotator cuff tear or rupture of left shoulder, not specified as traumatic: Secondary | ICD-10-CM

## 2021-01-03 DIAGNOSIS — L600 Ingrowing nail: Secondary | ICD-10-CM

## 2021-01-03 MED ORDER — DOXYCYCLINE HYCLATE 100 MG PO CAPS
100.0000 mg | ORAL_CAPSULE | Freq: Two times a day (BID) | ORAL | 0 refills | Status: DC
  Filled 2021-01-03: qty 20, 10d supply, fill #0

## 2021-01-03 NOTE — ED Provider Notes (Signed)
Alexandra Norris    CSN: [id number] Arrival date & time: 01/03/21  1217      History   Chief Complaint Chief Complaint  Patient presents with   ingrown toe nail    HPI Alexandra Norris is a 45 y.o. female.  Medical interpreter Anderson Malta helped assisted with our visit today.  HPI  Ingrown Toenail: Pt reports that for the past week they have had an ingrown toenail on the left great toe. Hurts to touch. No discharge, fevers, reduction in ROM. They do have a history of DM2. They have not tried anything for symptoms.  Of note patient and I were able to get through her history easily with back-and-forth dialogue before medical interpreter joined [place] for our visit.  We did have the medical interpreter joined [place] for the last part of the visit to explain my directions.  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   Shingles 11/14/2020   Neck pain 10/14/2020   Encounter to establish care 09/09/2020   Fatty liver disease, nonalcoholic 28/41/3244   Elevated blood-pressure reading, without diagnosis of hypertension 06/17/2020   Influenza vaccine needed 04/03/2020   Cervical disc disorder at C5-C6 level with radiculopathy 04/03/2020   S/P carpal tunnel release 08/30/2019   Right carpal tunnel syndrome 07/11/2019   Left carpal tunnel syndrome 07/11/2019   Arthritis    Anemia    Seasonal allergies 10/08/2016   Nasal obstruction 02/18/2016   DM2 (diabetes mellitus, type 2) ([place]) 10/21/2015   Bilateral chronic knee pain 09/25/2015    GASTROESOPHAGEAL REFLUX DISEASE 08/14/2009   DEPRESSION, SITUATIONAL, PROLONGED 02/05/2009   Body mass index (BMI) 45.0-49.9, adult ([place]) 05/30/2007   Moderate persistent asthma with acute exacerbation 12/06/2006    Past Surgical History:  Procedure Laterality Date   ABDOMINAL HYSTERECTOMY  10/17/2019   APPENDECTOMY     CARPAL TUNNEL RELEASE Right 07/19/2019   Procedure: RIGHT CARPAL TUNNEL RELEASE;  Surgeon: Leandrew Koyanagi, MD;  Location: [facility];  Service: Orthopedics;  Laterality: Right;   CARPAL TUNNEL RELEASE Left 09/13/2019   Procedure: LEFT CARPAL TUNNEL RELEASE;  Surgeon: Leandrew Koyanagi, MD;  Location: [facility];  Service: Orthopedics;  Laterality: Left;  Bier block   CESAREAN SECTION  1997; 2008   CHOLECYSTECTOMY  2011   TONSILLECTOMY     tonsilletomy     TUBAL LIGATION  04/2007   VAGINAL HYSTERECTOMY Bilateral 10/17/2019   Procedure: HYSTERECTOMY VAGINAL WITH SALPINGECTOMY;  Surgeon: Chancy Milroy, MD;  Location: [facility];  Service: Gynecology;  Laterality: Bilateral;    OB History     Gravida  3   Para  3   Term  3   Preterm      AB      Living  3      SAB      IAB      Ectopic  Multiple      Live Births               Home Medications    Prior to Admission medications   Medication Sig Start Date End Date Taking? Authorizing Provider  metFORMIN (GLUCOPHAGE) 500 MG tablet TAKE 1 TABLET (500 MG TOTAL) BY MOUTH 2 (TWO) TIMES DAILY WITH A MEAL. 08/05/20 08/05/21 Yes Charlott Rakes, MD  acetaminophen (TYLENOL) 325 MG tablet Take 650 mg by mouth every 6 (six) hours as needed.    [provider]  albuterol (VENTOLIN HFA) 108 (90 Base) MCG/ACT inhaler Inhale 2 puffs into the lungs in the morning and at bedtime. 09/09/20   Orma Render, NP  atorvastatin (LIPITOR) 20 MG tablet TAKE 1 TABLET (20 MG TOTAL) BY MOUTH DAILY. 08/05/20 08/05/21  Charlott Rakes, MD  Blood Glucose Monitoring Suppl (CONTOUR NEXT MONITOR)  w/Device KIT USE SEGUN LO INDICADO 3 VECES AL DIA ANTES DE LAS COMIDAS 08/05/20 08/05/21  Charlott Rakes, MD  Blood Glucose Monitoring Suppl (TRUE METRIX METER) DEVI 1 each by Does not apply route 3 (three) times daily before meals. 08/05/20   Charlott Rakes, MD  cyclobenzaprine (FLEXERIL) 10 MG tablet TOME 1 PASTILLA POR VIA ORAL 3 VECES AL DIA 04/26/20 04/26/21  Costella, Vista Mink, PA-C  diclofenac (VOLTAREN) 50 MG EC tablet Take 1 tablet (50 mg total) by mouth 2 (two) times daily. 12/12/20   Jessy Oto, MD  gabapentin (NEURONTIN) 100 MG capsule Take 1 tab (157m) up to every 8 hours during the day as needed for pain.  Take 3 tab (3061m at bedtime as needed for pain. 11/14/20   EaOrma RenderNP  gabapentin (NEURONTIN) 100 MG capsule Take 1 capsule (100 mg total) by mouth at bedtime. 12/12/20   NiJessy OtoMD  glucose blood (TRUE METRIX BLOOD GLUCOSE TEST) test strip Use 3 times daily before meals 08/05/20   Newlin, Enobong, MD  glucose blood test strip USE SEGUN LO INDICADO 3 VECES AL DIA ANTES DE LAS COMIDAS Patient taking differently: USE SEGUN LO INDICADO 3 VECES AL DIA ANTES DE LAS COMIDAS 08/05/20 08/05/21  NeCharlott RakesMD  metFORMIN (GLUCOPHAGE) 500 MG tablet TAKE 1 TABLET WITH BREAKFAST AND 2 TABLETS WITH DINNER Patient taking differently: TAKE 1 TABLET WITH BREAKFAST AND 2 TABLETS WITH DINNER 09/09/20   Early, SaCoralee PesaNP  Microlet Lancets MISC Use to check blood sugar TID. 08/06/20   NeCharlott RakesMD  Microlet Lancets MISC USE TO CHECK BLOOD SUGAR 3 VECES AL DIA 08/06/20 08/06/21  NeCharlott RakesMD  Multiple Vitamin (MULTIVITAMIN WITH MINERALS) TABS tablet Take 1 tablet by mouth daily in the afternoon.    [provider]  TRUEplus Lancets 28G MISC USE SEGUN LO INDICADO 3 VECES AL DIA ANTES DE LAS COMIDAS 08/05/20 08/05/21  NeCharlott RakesMD  valACYclovir (VALTREX) 1000 MG tablet Take 1 tablet (1,000 mg total) by mouth 3 (three) times daily for 7 days. 11/14/20 11/26/20  Early, Coralee PesaNP    Family History Family History  Problem Relation Age of Onset   Diabetes Mother    Osteoarthritis Mother    Hypertension Mother    Hyperlipidemia Mother    Arthritis Mother     Social History Social History   Tobacco Use   Smoking status: Never   Smokeless tobacco: Never  Vaping Use   Vaping Use: Never used  Substance Use Topics   Alcohol use: No   Drug use: No  Allergies   Patient has no known allergies.   Review of Systems Review of Systems  As stated above in HPI Physical Exam Triage Vital Signs ED Triage Vitals  Enc Vitals Group     BP 01/03/21 1341 136/73     Pulse Rate 01/03/21 1341 84     Resp 01/03/21 1341 18     Temp 01/03/21 1341 98.2 F (36.8 C)     Temp Source 01/03/21 1341 Oral     SpO2 01/03/21 1341 100 %     Weight --      Height --      Head Circumference --      Peak Flow --      Pain Score 01/03/21 1338 9     Pain Loc --      Pain Edu? --      Excl. in [place]? --    No data found.  Updated Vital Signs BP 136/73 (BP Location: Right Arm)   Pulse 84   Temp 98.2 F (36.8 C) (Oral)   Resp 18   LMP 09/20/2019 (Exact Date)   SpO2 100%   Physical Exam Vitals and nursing note reviewed.  Constitutional:      General: She is not in acute distress.    Appearance: Normal appearance. She is not ill-appearing, toxic-appearing or diaphoretic.  Cardiovascular:     Pulses: Normal pulses.  Musculoskeletal:        General: Swelling (mild medial left great toenail) and tenderness present. Normal range of motion.  Skin:    General: Skin is warm.     Capillary Refill: Capillary refill takes less than 2 seconds.     Coloration: Skin is not jaundiced.     Findings: Erythema present. No bruising, lesion or rash.  Neurological:     Mental Status: She is alert.     Sensory: No sensory deficit.     UC Treatments / Results  Labs (all labs ordered are listed, but only abnormal results are displayed) Labs Reviewed - No data to  display  EKG   Radiology No results found.  Procedures Procedures (including critical care time)  Medications Ordered in UC Medications - No data to display  Initial Impression / Assessment and Plan / UC Course  I have reviewed the triage vital signs and the nursing notes.  Pertinent labs & imaging results that were available during my care of the patient were reviewed by me and considered in my medical decision making (see chart for details).     New.  We are going to start her on doxycycline to help prevent further systemic complications.  Discussed how to use this medication along with common potential side effects and precautions.  She will follow-up on Tuesday with her specialist.  In the meantime she will try Epsom salt warm water baths. Discussed red flag signs and symptoms.  Final Clinical Impressions(s) / UC Diagnoses   Final diagnoses:  None   Discharge Instructions   None    ED Prescriptions   None    PDMP not reviewed this encounter.   Hughie Closs, [place] 01/03/21 1428

## 2021-01-03 NOTE — Progress Notes (Signed)
Office Visit Note   Patient: Alexandra Norris           Date of Birth: 1976/04/14           MRN: [id number] Visit Date: 01/03/2021              Requested by: Orma Render, NP [address],  [place] [address] PCP: Orma Render, NP   Assessment & Plan: Visit Diagnoses:  1. Cubital tunnel syndrome on right   2. Nontraumatic tear of supraspinatus tendon, left     Plan: EMGs were negative for cubital tunnel however based on clinical findings my impression is that she does have cubital tunnel syndrome and I explained that often EMGs can be normal.  She is interested in trying a cortisone injection for this so we will refer the patient to Dr. Junius Roads so that this can be done under ultrasound.  In regards to the left shoulder she has undergone multiple injections and rounds of physical therapy but she continues to have pain that is limiting her ADLs.  I reviewed the MRI of the left shoulder in detail again and supraspinatus does show an area with a high grade partial-thickness tear with a small area that is suspicious for a full-thickness tear.  Again we had a discussion on treatment options to include continued conservative treatments and physical therapy versus arthroscopic debridement and likely repair.  Based on her options she would like to proceed with surgical treatment for the left shoulder.  Risk benefits rehab recovery reviewed with the patient.  Questions encouraged and answered.  Interpreter present today.  Follow-Up Instructions: No follow-ups on file.   Orders:  No orders of the defined types were placed in this encounter.  No orders of the defined types were placed in this encounter.     Procedures: No procedures performed   Clinical Data: No additional findings.   Subjective: Chief Complaint  Patient presents with   Right Hand - Follow-up    EMG Review    HPI Patient returns today for recent EMG review to evaluate for cubital  tunnel syndrome and follow-up for chronic left shoulder pain.  Interpreter present today. Review of Systems   Objective: Vital Signs: Ht '5\' 1"'$  (1.549 m)   Wt 239 lb (108.4 kg)   LMP 09/20/2019 (Exact Date)   BMI 45.16 kg/m   Physical Exam  Ortho Exam Right elbow shows positive Tinel at the cubital tunnel.  She has paresthesias in the ring and little finger.  No muscle atrophy.  Left shoulder shows pain and slight decrease in strength with empty can testing.  Negative cross body adduction.  Belly press normal. Specialty Comments:  No specialty comments available.  Imaging: No results found.   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   Shingles 11/14/2020   Neck pain 10/14/2020   Encounter to establish care 09/09/2020   Fatty liver disease, nonalcoholic [id number]   Elevated blood-pressure reading, without diagnosis of hypertension 06/17/2020   Influenza vaccine needed 04/03/2020   Cervical disc disorder at C5-C6 level with radiculopathy 04/03/2020   S/P carpal tunnel release 08/30/2019   Right carpal tunnel syndrome 07/11/2019   Left carpal tunnel syndrome 07/11/2019   Arthritis    Anemia    Seasonal allergies 10/08/2016   Nasal obstruction 02/18/2016   DM2 (diabetes mellitus, type 2) ([place]) 10/21/2015   Bilateral chronic knee pain 09/25/2015   GASTROESOPHAGEAL REFLUX  DISEASE 08/14/2009   DEPRESSION, SITUATIONAL, PROLONGED 02/05/2009   Body mass index (BMI) 45.0-49.9, adult ([place]) 05/30/2007   Moderate persistent asthma with acute exacerbation 12/06/2006   Past Medical History:  Diagnosis Date   Anemia    Anxiety    Arthritis    Asthma    Carpal tunnel syndrome    Depression    Dyspnea    Endometriosis    History of blood transfusion 1999   w/vaginal delivery   History of bronchitis    "I've stayed in hospital 2 X w/bronchitis"   Migraine    Pneumonia    PONV (postoperative nausea and  vomiting)    Pre-diabetes 10/03/2019   A1C 6.4    Family History  Problem Relation Age of Onset   Diabetes Mother    Osteoarthritis Mother    Hypertension Mother    Hyperlipidemia Mother    Arthritis Mother     Past Surgical History:  Procedure Laterality Date   ABDOMINAL HYSTERECTOMY  10/17/2019   APPENDECTOMY     CARPAL TUNNEL RELEASE Right 07/19/2019   Procedure: RIGHT CARPAL TUNNEL RELEASE;  Surgeon: Leandrew Koyanagi, MD;  Location: [facility];  Service: Orthopedics;  Laterality: Right;   CARPAL TUNNEL RELEASE Left 09/13/2019   Procedure: LEFT CARPAL TUNNEL RELEASE;  Surgeon: Leandrew Koyanagi, MD;  Location: [facility];  Service: Orthopedics;  Laterality: Left;  Bier block   CESAREAN SECTION  1997; 2008   CHOLECYSTECTOMY  2011   TONSILLECTOMY     tonsilletomy     TUBAL LIGATION  04/2007   VAGINAL HYSTERECTOMY Bilateral 10/17/2019   Procedure: HYSTERECTOMY VAGINAL WITH SALPINGECTOMY;  Surgeon: Chancy Milroy, MD;  Location: [facility];  Service: Gynecology;  Laterality: Bilateral;   Social History   Occupational History   Not on file  Tobacco Use   Smoking status: Never   Smokeless tobacco: Never  Vaping Use   Vaping Use: Never used  Substance and Sexual Activity   Alcohol use: No   Drug use: No   Sexual activity: Yes    Birth control/protection: Surgical

## 2021-01-03 NOTE — ED Triage Notes (Signed)
Pt c/o pain to left foot d/t ingrown nail in great toe (for about a week). Does not appear infected.

## 2021-01-03 NOTE — ED Notes (Signed)
Attempt to call in lobby.

## 2021-01-07 ENCOUNTER — Other Ambulatory Visit: Payer: Self-pay

## 2021-01-07 ENCOUNTER — Encounter: Payer: Self-pay | Admitting: Family Medicine

## 2021-01-07 ENCOUNTER — Ambulatory Visit (INDEPENDENT_AMBULATORY_CARE_PROVIDER_SITE_OTHER): Payer: BC Managed Care – PPO | Admitting: Family Medicine

## 2021-01-07 ENCOUNTER — Ambulatory Visit: Payer: BC Managed Care – PPO | Admitting: Podiatry

## 2021-01-07 ENCOUNTER — Ambulatory Visit: Payer: Self-pay

## 2021-01-07 DIAGNOSIS — G5621 Lesion of ulnar nerve, right upper limb: Secondary | ICD-10-CM

## 2021-01-07 DIAGNOSIS — L600 Ingrowing nail: Secondary | ICD-10-CM

## 2021-01-07 NOTE — Patient Instructions (Signed)
Instrucciones de remojo  El dia despues del procedimiento:  Coloque 1/4 taza de sal de epsom en un litro de agua tibia del grifo. Sumerja su pie o pies con el vendaje externo intacto para el remojo inicial; esto permitira que el vendaje se humdezca y humedezca  para despegarlo facilmente. Una ves que retire su vendaje, continue remojando la solucion durante 20 minutos. Este remojo deber [employer] al dia. Luego, retire su pie o pies de la solucion, seque el area [place]. Puede usar una curita lo suficientemente grande como para cubrir el area o usar una gasa y Retail banker. Aplique otros medicamentos en el area [place] indicaciones del medico, como la polisporina neosporina.    SI SU PIEL SE IRRITA AL USAR ESTAS INSTRUCCIONES, ES ACEPTABLE CAMBIARSE AL [place]. O puede usar agua y jabon antibacterial para mantener limpio el dedo del pie.   Monitoree cualquier signo/sintoma de infeccion. Llame a la oficina de inmediato si occure o vaya directament a la sal de emergencias. Llame con cualquier pregunta/inquitud.

## 2021-01-07 NOTE — Progress Notes (Signed)
Subjective: She is here for planned right cubital tunnel injection.  Ongoing paresthesias in her arm with normal nerve conduction studies.  Objective: Positive Tinel's at cubital tunnel.  Procedure: Right tunnel injection: After sterile prep with Betadine and alcohol, identified the ulnar nerve in the cubital tunnel and injected 1-1/2 cc 1% lidocaine without epinephrine and 40 mg Depo-Medrol near the nerve but not touching the nerve.  Ultrasound guidance was used.  She had no immediate complications.  She will follow-up with Dr. Erlinda Hong as scheduled.

## 2021-01-07 NOTE — Progress Notes (Signed)
  Subjective:  Patient ID: Alexandra Norris, female    DOB: [birth year],  MRN: [id number]  Chief Complaint  Patient presents with   Nail Problem    Patient went to the nail salon where they went down in the corner of her nail , painful , 6/10 bleeding Virgel Manifold /hard to walk /     45 y.o. female presents with the above complaint. History confirmed with patient.  The lateral side of both hallux nails are painful.  She has a Optometrist with her here today translating from [place] to [place]  Objective:  Physical Exam: warm, good capillary refill, no trophic changes or ulcerative lesions, normal DP and PT pulses, and normal sensory exam. Left Foot:  Ingrown nail with mild paronychia lateral hallux Right Foot:  Ingrown nail right hallux lateral without paronychia  Assessment:   1. Ingrowing left great toenail   2. Ingrowing right great toenail      Plan:  Patient was evaluated and treated and all questions answered.    Ingrown Nail, left -Patient elects to proceed with minor surgery to remove ingrown toenail today. Consent reviewed and signed by patient. -Ingrown nail excised. See procedure note. -Educated on post-procedure care including soaking. Written instructions provided and reviewed. -Patient to follow up in 2 weeks for nail check. -Discussed with her this will be a staged procedure to allow the infection to resolve in the nail to regrow if it recurs then we will plan for permanent procedure in 6 months  Procedure: Excision of Ingrown Toenail Location: Left 1st toe lateral nail borders. Anesthesia: Lidocaine 1% plain; 1.5 mL and Marcaine 0.5% plain; 1.5 mL, digital block. Skin Prep: Betadine. Dressing: Silvadene; telfa; dry, sterile, compression dressing. Technique: Following skin prep, the toe was exsanguinated and a tourniquet was secured at the base of the toe. The affected nail border was freed, split with a nail splitter, and excised.  The tourniquet was  then removed and sterile dressing applied. Disposition: Patient tolerated procedure well. Patient to return in 2 weeks for follow-up.    Return in about 2 weeks (around 01/21/2021) for nail re-check.

## 2021-01-13 ENCOUNTER — Other Ambulatory Visit (HOSPITAL_COMMUNITY): Payer: Self-pay

## 2021-01-17 ENCOUNTER — Other Ambulatory Visit: Payer: Self-pay

## 2021-01-17 ENCOUNTER — Encounter: Payer: Self-pay | Admitting: Specialist

## 2021-01-17 ENCOUNTER — Ambulatory Visit: Payer: BC Managed Care – PPO | Admitting: Specialist

## 2021-01-17 ENCOUNTER — Other Ambulatory Visit (HOSPITAL_COMMUNITY): Payer: Self-pay

## 2021-01-17 VITALS — BP 128/81 | HR 92 | Ht 61.0 in | Wt 239.0 lb

## 2021-01-17 DIAGNOSIS — M75102 Unspecified rotator cuff tear or rupture of left shoulder, not specified as traumatic: Secondary | ICD-10-CM | POA: Diagnosis not present

## 2021-01-17 DIAGNOSIS — M542 Cervicalgia: Secondary | ICD-10-CM

## 2021-01-17 DIAGNOSIS — M47812 Spondylosis without myelopathy or radiculopathy, cervical region: Secondary | ICD-10-CM | POA: Diagnosis not present

## 2021-01-17 MED ORDER — TRAMADOL-ACETAMINOPHEN 37.5-325 MG PO TABS
1.0000 | ORAL_TABLET | Freq: Four times a day (QID) | ORAL | 0 refills | Status: DC | PRN
  Filled 2021-01-17: qty 30, 8d supply, fill #0

## 2021-01-17 NOTE — Patient Instructions (Signed)
Plan: Avoid overhead lifting and overhead use of the arms. Do not lift greater than 5 lbs. Adjust head rest in vehicle to prevent hyperextension if rear ended. Take extra precautions to avoid falling.Avoid overhead lifting and overhead use of the arms. Pillows to keep from sleeping directly on the shoulders Limited lifting to less than 10 lbs. Ice or heat for relief. NSAIDs are helpful, such as alleve or motrin, be careful not to use in excess as they place burdens on the kidney. Stretching exercise help and strengthening is helpful to build endurance. Tramadol in form of ultracet for pain due to shoulder and mild cervical spondylosis. Wait till after shoulder surgery to consider any further PT for neck as it is likely previous therapy tended to exacerbate shoulder discomfort.

## 2021-01-17 NOTE — Progress Notes (Signed)
Office Visit Note   Patient: Alexandra Norris           Date of Birth: September 20, 1975           MRN: [id number] Visit Date: 01/17/2021              Requested by: Orma Render, NP [address] [place],  [place] [address] PCP: Orma Render, NP   Assessment & Plan: Visit Diagnoses:  1. Nontraumatic tear of supraspinatus tendon, left   2. Cervicalgia   3. Spondylosis without myelopathy or radiculopathy, cervical region     Plan: Avoid overhead lifting and overhead use of the arms. Do not lift greater than 5 lbs. Adjust head rest in vehicle to prevent hyperextension if rear ended. Take extra precautions to avoid falling.Avoid overhead lifting and overhead use of the arms. Pillows to keep from sleeping directly on the shoulders Limited lifting to less than 10 lbs. Ice or heat for relief. NSAIDs are helpful, such as alleve or motrin, be careful not to use in excess as they place burdens on the kidney. Stretching exercise help and strengthening is helpful to build endurance. Tramadol in form of ultracet for pain due to shoulder and mild cervical spondylosis. Wait till after shoulder surgery to consider any further PT for neck as it is likely previous therapy tended to exacerbate shoulder discomfort.  Follow-Up Instructions: Return in about 8 weeks (around 03/14/2021).   Orders:  No orders of the defined types were placed in this encounter.  Meds ordered this encounter  Medications   traMADol-acetaminophen (ULTRACET) 37.5-325 MG tablet    Sig: Take 1 tablet by mouth every 6 (six) hours as needed.    Dispense:  30 tablet    Refill:  0      Procedures: No procedures performed   Clinical Data: No additional findings.   Subjective: Chief Complaint  Patient presents with   Neck - Follow-up   Left Shoulder - Follow-up    HPI  Review of Systems   Objective: Vital Signs: BP 128/81   Pulse 92   LMP 09/20/2019 (Exact Date)   Physical  Exam  Ortho Exam  Specialty Comments:  No specialty comments available.  Imaging: No results found.   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   Shingles 11/14/2020   Neck pain 10/14/2020   Encounter to establish care 09/09/2020   Fatty liver disease, nonalcoholic [id number]   Elevated blood-pressure reading, without diagnosis of hypertension 06/17/2020   Influenza vaccine needed 04/03/2020   Cervical disc disorder at C5-C6 level with radiculopathy 04/03/2020   S/P carpal tunnel release 08/30/2019   Right carpal tunnel syndrome 07/11/2019   Left carpal tunnel syndrome 07/11/2019   Arthritis    Anemia    Seasonal allergies 10/08/2016   Nasal obstruction 02/18/2016   DM2 (diabetes mellitus, type 2) ([place]) 10/21/2015   Bilateral chronic knee pain 09/25/2015   GASTROESOPHAGEAL REFLUX DISEASE 08/14/2009   DEPRESSION, SITUATIONAL, PROLONGED 02/05/2009   Body mass index (BMI) 45.0-49.9, adult ([place]) 05/30/2007   Moderate persistent asthma with acute exacerbation 12/06/2006   Past Medical History:  Diagnosis Date   Anemia    Anxiety    Arthritis    Asthma    Carpal tunnel syndrome    Depression    Dyspnea    Endometriosis    History of blood transfusion 1999   w/vaginal delivery   History of bronchitis    "  I've stayed in hospital 2 X w/bronchitis"   Migraine    Pneumonia    PONV (postoperative nausea and vomiting)    Pre-diabetes 10/03/2019   A1C 6.4    Family History  Problem Relation Age of Onset   Diabetes Mother    Osteoarthritis Mother    Hypertension Mother    Hyperlipidemia Mother    Arthritis Mother     Past Surgical History:  Procedure Laterality Date   ABDOMINAL HYSTERECTOMY  10/17/2019   APPENDECTOMY     CARPAL TUNNEL RELEASE Right 07/19/2019   Procedure: RIGHT CARPAL TUNNEL RELEASE;  Surgeon: Leandrew Koyanagi, MD;  Location: [facility];  Service: Orthopedics;   Laterality: Right;   CARPAL TUNNEL RELEASE Left 09/13/2019   Procedure: LEFT CARPAL TUNNEL RELEASE;  Surgeon: Leandrew Koyanagi, MD;  Location: [facility];  Service: Orthopedics;  Laterality: Left;  Bier block   CESAREAN SECTION  1997; 2008   CHOLECYSTECTOMY  2011   TONSILLECTOMY     tonsilletomy     TUBAL LIGATION  04/2007   VAGINAL HYSTERECTOMY Bilateral 10/17/2019   Procedure: HYSTERECTOMY VAGINAL WITH SALPINGECTOMY;  Surgeon: Chancy Milroy, MD;  Location: [facility];  Service: Gynecology;  Laterality: Bilateral;   Social History   Occupational History   Not on file  Tobacco Use   Smoking status: Never   Smokeless tobacco: Never  Vaping Use   Vaping Use: Never used  Substance and Sexual Activity   Alcohol use: No   Drug use: No   Sexual activity: Yes    Birth control/protection: Surgical

## 2021-01-21 ENCOUNTER — Ambulatory Visit: Payer: BC Managed Care – PPO | Admitting: Podiatry

## 2021-01-23 ENCOUNTER — Encounter: Payer: BC Managed Care – PPO | Admitting: Orthopedic Surgery

## 2021-01-28 ENCOUNTER — Other Ambulatory Visit: Payer: Self-pay | Admitting: Physician Assistant

## 2021-01-28 ENCOUNTER — Other Ambulatory Visit (HOSPITAL_COMMUNITY): Payer: Self-pay

## 2021-01-28 MED ORDER — ONDANSETRON HCL 4 MG PO TABS
4.0000 mg | ORAL_TABLET | Freq: Three times a day (TID) | ORAL | 0 refills | Status: DC | PRN
  Filled 2021-01-28: qty 40, 14d supply, fill #0

## 2021-01-28 MED ORDER — HYDROCODONE-ACETAMINOPHEN 5-325 MG PO TABS
1.0000 | ORAL_TABLET | Freq: Three times a day (TID) | ORAL | 0 refills | Status: DC | PRN
  Filled 2021-01-28: qty 30, 5d supply, fill #0

## 2021-01-29 ENCOUNTER — Telehealth: Payer: Self-pay | Admitting: Orthopaedic Surgery

## 2021-01-29 NOTE — Telephone Encounter (Signed)
I think she was supposed to have shoulder surgery tomorrow at the surgery center but she canceled because of the co-pay.

## 2021-01-29 NOTE — Telephone Encounter (Signed)
Dr. Erlinda Hong opened up for Friday/. Made her appt 8/26 at 1:15pm

## 2021-01-29 NOTE — Telephone Encounter (Signed)
Pt husband called requesting his wife can be seen sooner or see another doctor that has availability between today and tomorrow. Please call pt about this matter at [phone].

## 2021-01-29 NOTE — Telephone Encounter (Signed)
Im assuming she's referring to her knee. Scheduled appt for 02/05/2021 with Dr. Rosine Door.   Did you want to see her sooner?

## 2021-01-30 ENCOUNTER — Emergency Department (HOSPITAL_COMMUNITY)
Admission: EM | Admit: 2021-01-30 | Discharge: 2021-01-31 | Disposition: A | Discharge status: Home / Self Care | Payer: BC Managed Care – PPO | Attending: Emergency Medicine | Admitting: Emergency Medicine

## 2021-01-30 DIAGNOSIS — Z7984 Long term (current) use of oral hypoglycemic drugs: Secondary | ICD-10-CM | POA: Diagnosis not present

## 2021-01-30 DIAGNOSIS — Z7951 Long term (current) use of inhaled steroids: Secondary | ICD-10-CM | POA: Insufficient documentation

## 2021-01-30 DIAGNOSIS — J45909 Unspecified asthma, uncomplicated: Secondary | ICD-10-CM | POA: Insufficient documentation

## 2021-01-30 DIAGNOSIS — E119 Type 2 diabetes mellitus without complications: Secondary | ICD-10-CM | POA: Diagnosis not present

## 2021-01-30 DIAGNOSIS — M25562 Pain in left knee: Secondary | ICD-10-CM | POA: Diagnosis present

## 2021-01-30 NOTE — ED Provider Notes (Signed)
Emergency Medicine Provider Triage Evaluation Note  Alexandra Norris , a 45 y.o. female  was evaluated in triage.  Pt complains of L knee pain.  Review of Systems  Positive: L knee pain Negative: Fever, trauma, numbness  Physical Exam  BP 120/85   Pulse (!) 106   Temp 98.6 F (37 C) (Oral)   Resp 18   LMP 09/20/2019 (Exact Date)   SpO2 97%  Gen:   Awake, no distress   Resp:  Normal effort  MSK:   Moves extremities without difficulty  Other:  L knee: TTP with mild effusion noted, able to flex and extend.  Patella is located.  No warmth or erythema, no joint laxity  Medical Decision Making  Medically screening exam initiated at 9:59 PM.  Appropriate orders placed.  [name] was informed that the remainder of the evaluation will be completed by another provider, this initial triage assessment does not replace that evaluation, and the importance of remaining in the ED until their evaluation is complete.  Pt here with atraumatic L knee pain x 2 weeks.  Had similar pain a year ago and orthopedist Dr. Erlinda Hong did a joint aspiration and pt felt better. Pain felt similar.  No hx of IVDU.     Alexandra Moras, PA-C 01/30/21 2201    Alexandra Natal, MD 01/30/21 2213

## 2021-01-30 NOTE — ED Triage Notes (Signed)
Pt c/o L knee pain x2wks, states similar experience 12yr ago, had to have fluid aspirated from joint. Pt denies fevers, chills, IVDU, able to bear weight but uncomfortable when doing so.  9/10 pain, pressure.

## 2021-01-31 ENCOUNTER — Emergency Department (HOSPITAL_COMMUNITY): Payer: BC Managed Care – PPO

## 2021-01-31 ENCOUNTER — Ambulatory Visit (INDEPENDENT_AMBULATORY_CARE_PROVIDER_SITE_OTHER): Payer: BC Managed Care – PPO | Admitting: Orthopaedic Surgery

## 2021-01-31 ENCOUNTER — Other Ambulatory Visit: Payer: Self-pay

## 2021-01-31 ENCOUNTER — Encounter: Payer: Self-pay | Admitting: Orthopaedic Surgery

## 2021-01-31 DIAGNOSIS — G8929 Other chronic pain: Secondary | ICD-10-CM | POA: Diagnosis not present

## 2021-01-31 DIAGNOSIS — M25562 Pain in left knee: Secondary | ICD-10-CM | POA: Diagnosis not present

## 2021-01-31 IMAGING — DX DG KNEE COMPLETE 4+V*L*
4 series · 4 of 4 positions shown · non-contrast
Comparison: None.

CLINICAL DATA: Left knee pain for 2 weeks. Query effusion. Similar
to appearance 2 years ago and had fluid aspirated.

EXAM:
LEFT KNEE - COMPLETE 4+ VIEW

[knee ap]
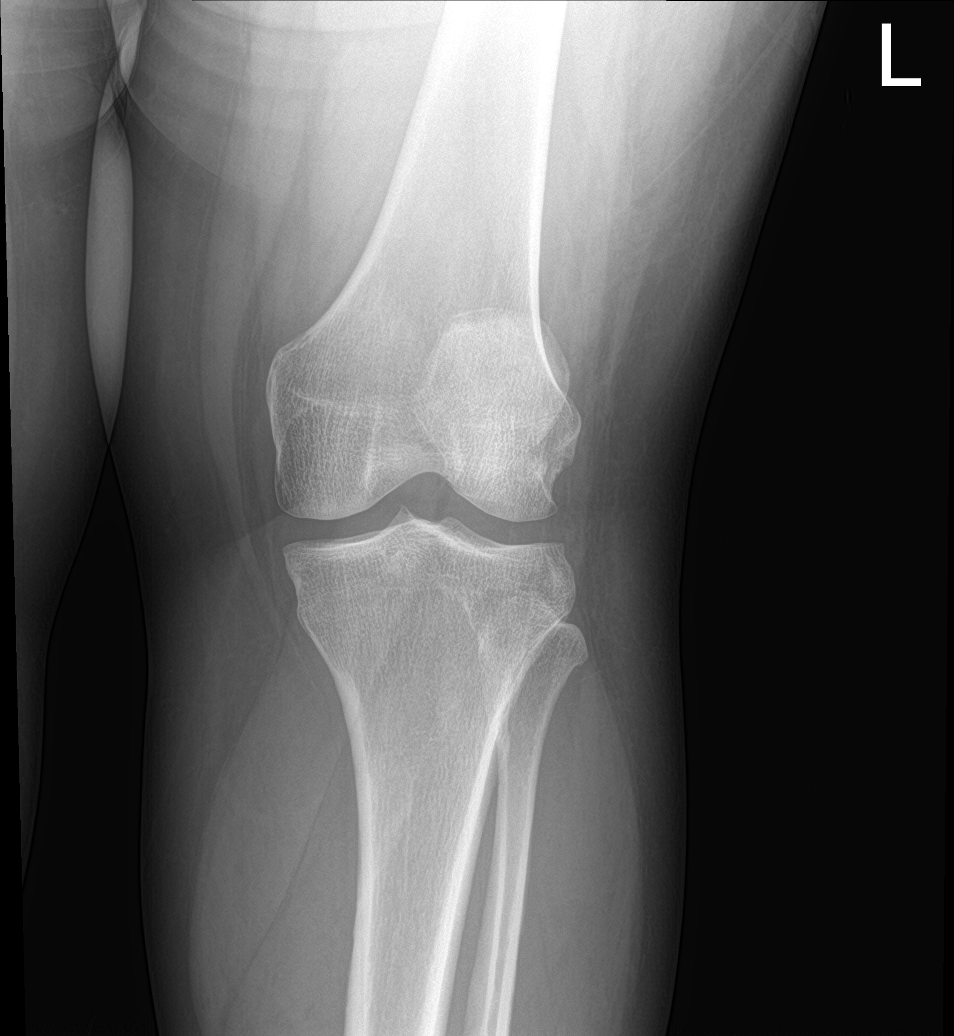

[knee lat]
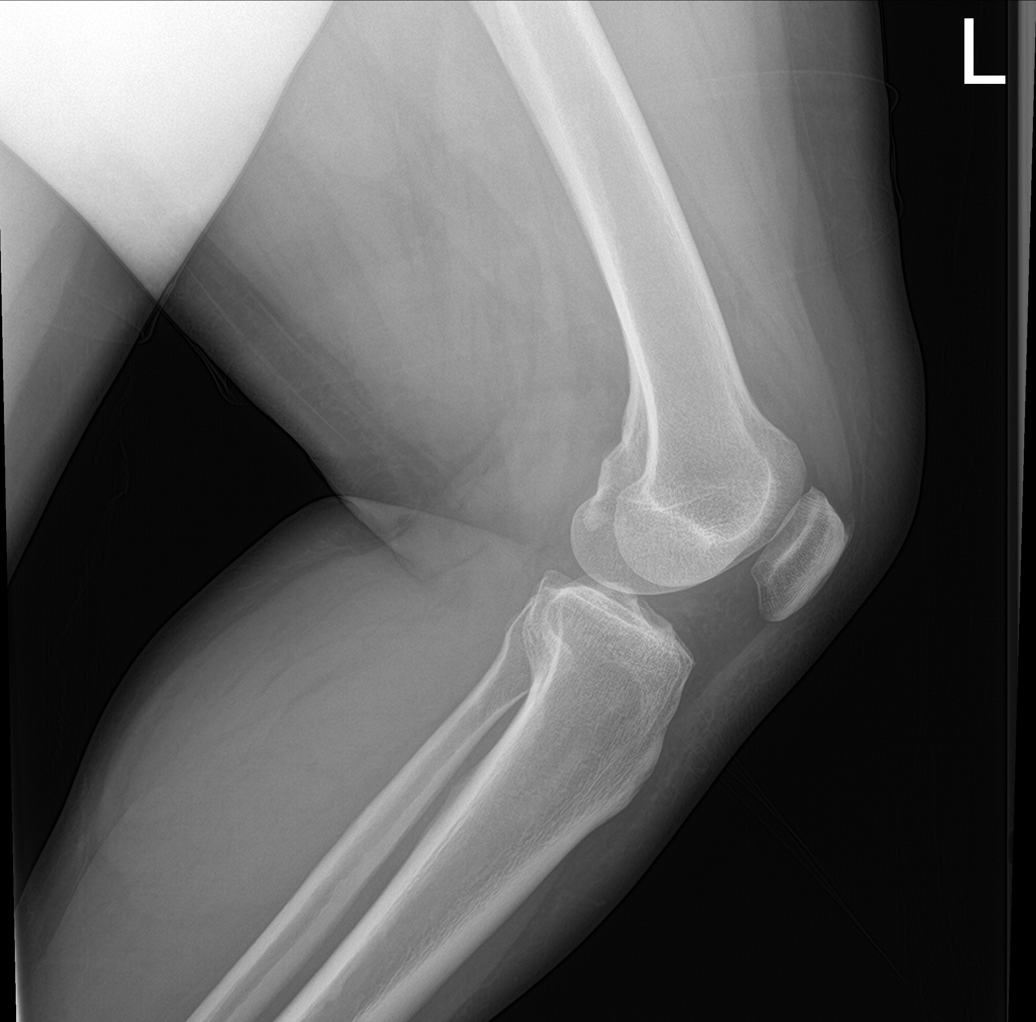

[knee obl (1 of 2)]
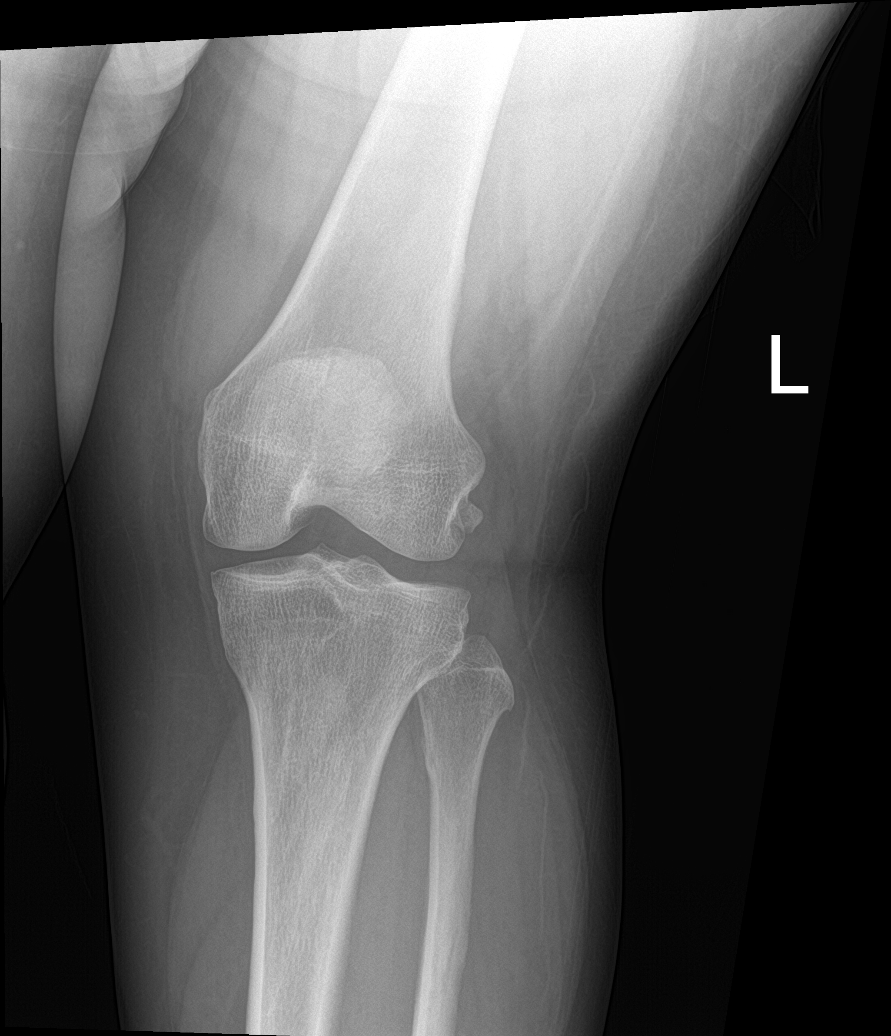

[knee obl (2 of 2)]
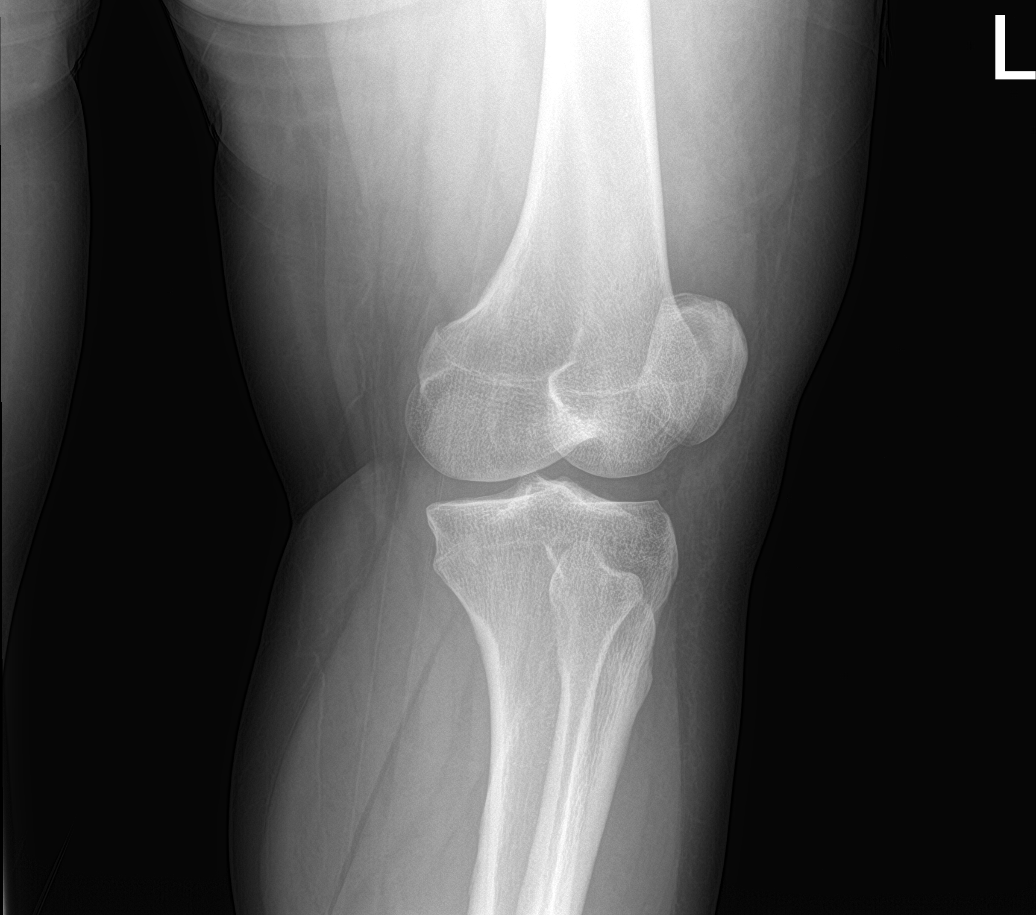

[4 of 4 positions shown; findings below may reference images not displayed]

FINDINGS: No evidence of fracture, dislocation, or joint effusion. No evidence
of arthropathy or other focal bone abnormality. Soft tissues are
unremarkable.
IMPRESSION: No acute bony abnormalities.  No significant effusion.

## 2021-01-31 MED ORDER — NAPROXEN 500 MG PO TABS: 500.0000 mg | ORAL_TABLET | Freq: Two times a day (BID) | ORAL | 0 refills | Status: DC

## 2021-01-31 NOTE — ED Provider Notes (Signed)
[facility] EMERGENCY DEPARTMENT Provider Note   CSN: [id number] Arrival date & time: 01/30/21  2146     History Chief Complaint  Patient presents with   Knee Pain    Alexandra Norris is a 45 y.o. female.  The history is provided by the patient and medical records.  Knee Pain  45 y.o F with hx of anemia, arthritis, asthma, depression, migraine headaches, presenting to the ED for left knee pain for the past 2 weeks.  She denies any new injury, trauma, or falls.  She reports similar incidence 2 years ago that required fluid to be drained off the knee.  She denies fever.  No hx of IVDU, cancer, or other joint infections.  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   Shingles 11/14/2020   Neck pain 10/14/2020   Encounter to establish care 09/09/2020   Fatty liver disease, nonalcoholic 13/24/4010   Elevated blood-pressure reading, without diagnosis of hypertension 06/17/2020   Influenza vaccine needed 04/03/2020   Cervical disc disorder at C5-C6 level with radiculopathy 04/03/2020   S/P carpal tunnel release 08/30/2019   Right carpal tunnel syndrome 07/11/2019   Left carpal tunnel syndrome 07/11/2019   Arthritis    Anemia    Seasonal allergies 10/08/2016   Nasal obstruction 02/18/2016   DM2 (diabetes mellitus, type 2) ([place]) 10/21/2015   Bilateral chronic knee pain 09/25/2015   GASTROESOPHAGEAL REFLUX DISEASE 08/14/2009   DEPRESSION, SITUATIONAL, PROLONGED 02/05/2009   Body mass index (BMI) 45.0-49.9,  adult ([place]) 05/30/2007   Moderate persistent asthma with acute exacerbation 12/06/2006    Past Surgical History:  Procedure Laterality Date   ABDOMINAL HYSTERECTOMY  10/17/2019   APPENDECTOMY     CARPAL TUNNEL RELEASE Right 07/19/2019   Procedure: RIGHT CARPAL TUNNEL RELEASE;  Surgeon: Leandrew Koyanagi, MD;  Location: [place];  Service: Orthopedics;  Laterality: Right;   CARPAL TUNNEL RELEASE Left 09/13/2019   Procedure: LEFT CARPAL TUNNEL RELEASE;  Surgeon: Leandrew Koyanagi, MD;  Location: [place];  Service: Orthopedics;  Laterality: Left;  Bier block   CESAREAN SECTION  1997; 2008   CHOLECYSTECTOMY  2011   TONSILLECTOMY     tonsilletomy     TUBAL LIGATION  04/2007   VAGINAL HYSTERECTOMY Bilateral 10/17/2019   Procedure: HYSTERECTOMY VAGINAL WITH SALPINGECTOMY;  Surgeon: Chancy Milroy, MD;  Location: [place];  Service: Gynecology;  Laterality: Bilateral;     OB History     Gravida  3   Para  3   Term  3   Preterm      AB      Living  3      SAB      IAB      Ectopic      Multiple      Live Births              Family  History  Problem Relation Age of Onset   Diabetes Mother    Osteoarthritis Mother    Hypertension Mother    Hyperlipidemia Mother    Arthritis Mother     Social History   Tobacco Use   Smoking status: Never   Smokeless tobacco: Never  Vaping Use   Vaping Use: Never used  Substance Use Topics   Alcohol use: No   Drug use: No    Home Medications Prior to Admission medications   Medication Sig Start Date End Date Taking? Authorizing Provider  naproxen (NAPROSYN) 500 MG tablet Take 1 tablet (500 mg total) by mouth 2 (two) times daily with a meal. 01/31/21  Yes Larene Pickett, PA-C  acetaminophen (TYLENOL) 325 MG tablet Take 650 mg by mouth every 6 (six) hours as needed.    [provider]  albuterol (VENTOLIN HFA) 108 (90 Base) MCG/ACT inhaler Inhale 2 puffs into the lungs in the morning and at  bedtime. 09/09/20   Orma Render, NP  atorvastatin (LIPITOR) 20 MG tablet TAKE 1 TABLET (20 MG TOTAL) BY MOUTH DAILY. 08/05/20 08/05/21  Charlott Rakes, MD  Blood Glucose Monitoring Suppl (CONTOUR NEXT MONITOR) w/Device KIT USE SEGUN LO INDICADO 3 VECES AL DIA ANTES DE LAS COMIDAS 08/05/20 08/05/21  Charlott Rakes, MD  Blood Glucose Monitoring Suppl (TRUE METRIX METER) DEVI 1 each by Does not apply route 3 (three) times daily before meals. 08/05/20   Charlott Rakes, MD  cyclobenzaprine (FLEXERIL) 10 MG tablet TOME 1 PASTILLA POR VIA ORAL 3 VECES AL DIA 04/26/20 04/26/21  Costella, Vista Mink, PA-C  diclofenac (VOLTAREN) 50 MG EC tablet Take 1 tablet (50 mg total) by mouth 2 (two) times daily. 12/12/20   Jessy Oto, MD  doxycycline (VIBRAMYCIN) 100 MG capsule Take 1 capsule (100 mg total) by mouth 2 (two) times daily. 01/03/21   Hughie Closs, PA-C  gabapentin (NEURONTIN) 100 MG capsule Take 1 tab ($Remo'100mg'WtJiy$ ) up to every 8 hours during the day as needed for pain.  Take 3 tab ($Remo'300mg'HvWqa$ ) at bedtime as needed for pain. 11/14/20   Orma Render, NP  gabapentin (NEURONTIN) 100 MG capsule Take 1 capsule (100 mg total) by mouth at bedtime. 12/12/20   Jessy Oto, MD  glucose blood (TRUE METRIX BLOOD GLUCOSE TEST) test strip Use 3 times daily before meals 08/05/20   Newlin, Enobong, MD  glucose blood test strip USE SEGUN LO INDICADO 3 VECES AL DIA ANTES DE LAS COMIDAS Patient taking differently: USE SEGUN LO INDICADO 3 VECES AL DIA ANTES DE LAS COMIDAS 08/05/20 08/05/21  Charlott Rakes, MD  HYDROcodone-acetaminophen (NORCO) 5-325 MG tablet Take 1-2 tablets by mouth 3 (three) times daily as needed. 01/28/21   Aundra Dubin, PA-C  metFORMIN (GLUCOPHAGE) 500 MG tablet TAKE 1 TABLET WITH BREAKFAST AND 2 TABLETS WITH DINNER Patient taking differently: TAKE 1 TABLET WITH BREAKFAST AND 2 TABLETS WITH DINNER 09/09/20   Early, Coralee Pesa, NP  metFORMIN (GLUCOPHAGE) 500 MG tablet TAKE 1 TABLET (500 MG TOTAL) BY MOUTH 2 (TWO) TIMES  DAILY WITH A MEAL. 08/05/20 08/05/21  Charlott Rakes, MD  Microlet Lancets MISC Use to check blood sugar TID. 08/06/20   Charlott Rakes, MD  Microlet Lancets MISC USE TO CHECK BLOOD SUGAR 3 VECES AL DIA 08/06/20 08/06/21  Charlott Rakes, MD  Multiple Vitamin (MULTIVITAMIN WITH MINERALS) TABS tablet Take 1 tablet by mouth daily in the afternoon.    [provider]  ondansetron (ZOFRAN) 4 MG tablet  Take 1 tablet (4 mg total) by mouth every 8 (eight) hours as needed for nausea or vomiting. 01/28/21   Aundra Dubin, PA-C  traMADol-acetaminophen (ULTRACET) 37.5-325 MG tablet Take 1 tablet by mouth every 6 (six) hours as needed. 01/17/21   Jessy Oto, MD  TRUEplus Lancets 28G MISC USE SEGUN LO INDICADO 3 VECES AL DIA ANTES DE LAS COMIDAS 08/05/20 08/05/21  Charlott Rakes, MD  valACYclovir (VALTREX) 1000 MG tablet Take 1 tablet (1,000 mg total) by mouth 3 (three) times daily for 7 days. 11/14/20 11/26/20  Orma Render, NP    Allergies    Patient has no known allergies.  Review of Systems   Review of Systems  Musculoskeletal:  Positive for arthralgias.  All other systems reviewed and are negative.  Physical Exam Updated Vital Signs BP 117/69 (BP Location: Right Arm)   Pulse 95   Temp 98.6 F (37 C) (Oral)   Resp 16   LMP 09/20/2019 (Exact Date)   SpO2 99%   Physical Exam Vitals and nursing note reviewed.  Constitutional:      Appearance: She is well-developed.  HENT:     Head: Normocephalic and atraumatic.  Eyes:     Conjunctiva/sclera: Conjunctivae normal.     Pupils: Pupils are equal, round, and reactive to light.  Cardiovascular:     Rate and Rhythm: Normal rate and regular rhythm.     Heart sounds: Normal heart sounds.  Pulmonary:     Effort: Pulmonary effort is normal.     Breath sounds: Normal breath sounds.  Abdominal:     General: Bowel sounds are normal.     Palpations: Abdomen is soft.  Musculoskeletal:        General: Normal range of motion.     Cervical back:  Normal range of motion.     Comments: Left knee is grossly normal in appearance, no significant swelling or asymmetry compared with right; there is diffuse tenderness without deformity, DP pulse intact, ambulatory but with pain  Skin:    General: Skin is warm and dry.  Neurological:     Mental Status: She is alert and oriented to person, place, and time.    ED Results / Procedures / Treatments   Labs (all labs ordered are listed, but only abnormal results are displayed) Labs Reviewed - No data to display  EKG None  Radiology DG Knee Complete 4 Views Left  Result Date: 01/31/2021 CLINICAL DATA:  Left knee pain for 2 weeks. Query effusion. Similar to appearance 2 years ago and had fluid aspirated. EXAM: LEFT KNEE - COMPLETE 4+ VIEW COMPARISON:  None. FINDINGS: No evidence of fracture, dislocation, or joint effusion. No evidence of arthropathy or other focal bone abnormality. Soft tissues are unremarkable. IMPRESSION: No acute bony abnormalities.  No significant effusion. Electronically Signed   By: Lucienne Capers M.D.   On: 01/31/2021 00:37    Procedures Procedures   Medications Ordered in ED Medications - No data to display  ED Course  I have reviewed the triage vital signs and the nursing notes.  Pertinent labs & imaging results that were available during my care of the patient were reviewed by me and considered in my medical decision making (see chart for details).    MDM Rules/Calculators/A&P                           45 y.o. F here with atraumatic left knee pain x2 weeks.  History of same in the past, has been seen by orthopedics (Dr. Erlinda Hong) and had joint aspiration.  She is afebrile, non-toxic in appearance.  No significant swelling, bony deformity, or appreciable effusion noted on exam.  She has no overlying erythema or warmth to touch.  Findings are not concerning for septic joint.  She remains ambulatory here in the ED but with some pain.  X-rays negative for acute bony  findings, no significant joint effusion noted.  Will place in knee sleeve and have her follow-up in office with Dr. Philippa Sicks.  Rx naprosyn, can ice and elevate as well.  Return here for new concerns.  Final Clinical Impression(s) / ED Diagnoses Final diagnoses:  Acute pain of left knee    Rx / DC Orders ED Discharge Orders          Ordered    naproxen (NAPROSYN) 500 MG tablet  2 times daily with meals        01/31/21 0117             Larene Pickett, PA-C 01/31/21 0139    Maudie Flakes, MD 01/31/21 [phone]

## 2021-01-31 NOTE — Discharge Instructions (Addendum)
Take the prescribed medication as directed.  Can wear brace for now, ice and elevation may help. Follow-up with Dr. Erlinda Hong. Return to the ED for new or worsening symptoms.

## 2021-01-31 NOTE — Progress Notes (Signed)
Office Visit Note   Patient: Alexandra Norris           Date of Birth: [birth year]           MRN: [id number] Visit Date: 01/31/2021              Requested by: Orma Render, NP [address] [place],  [place] [id number] PCP: Orma Render, NP   Assessment & Plan: Visit Diagnoses:  1. Chronic pain of left knee     Plan: Impression is left knee questionable lateral meniscus tear.  We have discussed cortisone injection in addition to MRI.  She is scheduled for shoulder surgery in the near future so she would like to proceed with cortisone injection today to provide her with relief even if temporary.  We will also go ahead and order an MRI of the left knee to assess for meniscal pathology.  Follow-up with [place] once completed.  Call with concerns or questions in meantime.  Follow-Up Instructions: Return for after MRI.   Orders:  Orders Placed This Encounter  Procedures   Large Joint Inj: L knee   MR Knee Left w/o contrast    No orders of the defined types were placed in this encounter.     Procedures: Large Joint Inj: L knee on 01/31/2021 1:18 PM Indications: pain Details: 22 G needle, anterolateral approach Medications: 2 mL lidocaine 1 %; 2 mL bupivacaine 0.25 %; 40 mg methylPREDNISolone acetate 40 MG/ML     Clinical Data: No additional findings.   Subjective: Chief Complaint  Patient presents with   Left Knee - Pain     HPI patient is a pleasant 45 year old female who comes in today with recurrent left knee pain.  This is been ongoing for the past 2 weeks and is progressively worsened.  The pain she has is to the entire knee but worse to the lateral aspect.  She describes as a constant pain with associated locking catching.  She does have increased pain when trying to fully straighten or flex her knee as well as when she is going up stairs.  She has been taking ibuprofen without relief of symptoms.  She was seen by [place] about a year ago for her  left knee where cortisone injection was performed.  She does have moderate relief for about 3 months.  Review of Systems as detailed in HPI.  All others reviewed and are negative.   Objective: Vital Signs: LMP 09/20/2019 (Exact Date)   Physical Exam well-developed well-nourished female in no acute distress.  Alert and oriented x3.  Ortho Exam examination of the left knee reveals a small effusion.  Range of motion 5 to 90 degrees.  Medial and lateral joint line tenderness.  Ligaments are stable.  She is neurovascular intact distally.  Specialty Comments:  No specialty comments available.  Imaging: No results found.   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   Shingles 11/14/2020   Neck pain 10/14/2020   Encounter to establish care 09/09/2020   Fatty liver disease, nonalcoholic [id number]   Elevated blood-pressure reading, without diagnosis of hypertension 06/17/2020   Influenza vaccine needed 04/03/2020   Cervical disc disorder at C5-C6 level with radiculopathy 04/03/2020   S/P carpal tunnel release 08/30/2019   Right carpal tunnel syndrome 07/11/2019   Left carpal tunnel syndrome 07/11/2019   Arthritis    Anemia    Seasonal allergies 10/08/2016   Nasal obstruction  02/18/2016   DM2 (diabetes mellitus, type 2) ([place]) 10/21/2015   Bilateral chronic knee pain 09/25/2015   GASTROESOPHAGEAL REFLUX DISEASE 08/14/2009   DEPRESSION, SITUATIONAL, PROLONGED 02/05/2009   Body mass index (BMI) 45.0-49.9, adult ([place]) 05/30/2007   Moderate persistent asthma with acute exacerbation 12/06/2006   Past Medical History:  Diagnosis Date   Anemia    Anxiety    Arthritis    Asthma    Carpal tunnel syndrome    Depression    Dyspnea    Endometriosis    History of blood transfusion 1999   w/vaginal delivery   History of bronchitis    "I've stayed in hospital 2 X w/bronchitis"   Migraine    Pneumonia    PONV  (postoperative nausea and vomiting)    Pre-diabetes 10/03/2019   A1C 6.4    Family History  Problem Relation Age of Onset   Diabetes Mother    Osteoarthritis Mother    Hypertension Mother    Hyperlipidemia Mother    Arthritis Mother     Past Surgical History:  Procedure Laterality Date   ABDOMINAL HYSTERECTOMY  10/17/2019   APPENDECTOMY     CARPAL TUNNEL RELEASE Right 07/19/2019   Procedure: RIGHT CARPAL TUNNEL RELEASE;  Surgeon: Leandrew Koyanagi, MD;  [facility];  Service: Orthopedics;  Laterality: Right;   CARPAL TUNNEL RELEASE Left 09/13/2019   Procedure: LEFT CARPAL TUNNEL RELEASE;  Surgeon: Leandrew Koyanagi, MD;  Location: [facility];  Service: Orthopedics;  Laterality: Left;  Bier block   CESAREAN SECTION  1997; 2008   CHOLECYSTECTOMY  2011   TONSILLECTOMY     tonsilletomy     TUBAL LIGATION  04/2007   VAGINAL HYSTERECTOMY Bilateral 10/17/2019   Procedure: HYSTERECTOMY VAGINAL WITH SALPINGECTOMY;  Surgeon: Chancy Milroy, MD;  Location: [facility];  Service: Gynecology;  Laterality: Bilateral;   Social History   Occupational History   Not on file  Tobacco Use   Smoking status: Never   Smokeless tobacco: Never  Vaping Use   Vaping Use: Never used  Substance and Sexual Activity   Alcohol use: No   Drug use: No   Sexual activity: Yes    Birth control/protection: Surgical

## 2021-02-01 MED ORDER — LIDOCAINE HCL 1 % IJ SOLN
2.0000 mL | INTRAMUSCULAR | Status: AC | PRN
  Administered 2021-01-31: 2 mL

## 2021-02-01 MED ORDER — BUPIVACAINE HCL 0.25 % IJ SOLN
2.0000 mL | INTRAMUSCULAR | Status: AC | PRN
  Administered 2021-01-31: 2 mL via INTRA_ARTICULAR

## 2021-02-01 MED ORDER — METHYLPREDNISOLONE ACETATE 40 MG/ML IJ SUSP
40.0000 mg | INTRAMUSCULAR | Status: AC | PRN
  Administered 2021-01-31: 40 mg via INTRA_ARTICULAR

## 2021-02-04 ENCOUNTER — Encounter (HOSPITAL_BASED_OUTPATIENT_CLINIC_OR_DEPARTMENT_OTHER)
Admission: RE | Admit: 2021-02-04 | Discharge: 2021-02-04 | Disposition: A | Discharge status: Home / Self Care | Payer: BC Managed Care – PPO | Source: Ambulatory Visit | Attending: Orthopaedic Surgery | Admitting: Orthopaedic Surgery

## 2021-02-04 ENCOUNTER — Other Ambulatory Visit: Payer: Self-pay

## 2021-02-04 ENCOUNTER — Encounter (HOSPITAL_BASED_OUTPATIENT_CLINIC_OR_DEPARTMENT_OTHER): Payer: Self-pay | Admitting: Orthopaedic Surgery

## 2021-02-04 DIAGNOSIS — Z01818 Encounter for other preprocedural examination: Secondary | ICD-10-CM | POA: Insufficient documentation

## 2021-02-04 LAB — BASIC METABOLIC PANEL
Anion gap: 7 (ref 5–15)
BUN: 9 mg/dL (ref 6–20)
CO2: 24 mmol/L (ref 22–32)
Calcium: 8.9 mg/dL (ref 8.9–10.3)
Chloride: 104 mmol/L (ref 98–111)
Creatinine, Ser: 0.54 mg/dL (ref 0.44–1.00)
GFR, Estimated: >60 mL/min (ref >60)
Glucose, Bld: 214 mg/dL — ABNORMAL HIGH (ref 70–99)
Potassium: 4 mmol/L (ref 3.5–5.1)
Sodium: 135 mmol/L (ref 135–145)

## 2021-02-04 NOTE — Progress Notes (Addendum)
      Enhanced Recovery after Surgery for Orthopedics Enhanced Recovery after Surgery is a protocol used to improve the stress on your body and your recovery after surgery.  Patient Instructions  The night before surgery:  No food after midnight. ONLY clear liquids after midnight  The day of surgery (if you do NOT have diabetes):  Drink ONE (1) Pre-Surgery Clear Ensure as directed.   This drink was given to you during your hospital  pre-op appointment visit. The pre-op nurse will instruct you on the time to drink the  Pre-Surgery Ensure depending on your surgery time. Finish the drink at the designated time by the pre-op nurse.  Nothing else to drink after completing the  Pre-Surgery Clear Ensure.  The day of surgery (if you have diabetes): Drink ONE (1) Gatorade 2 (G2) as directed. This drink was given to you during your hospital  pre-op appointment visit.  The pre-op nurse will instruct you on the time to drink the   Gatorade 2 (G2) depending on your surgery time. Color of the Gatorade may vary. Red is not allowed. Nothing else to drink after completing the  Gatorade 2 (G2).         If you have questions, please contact your surgeon's office.   Patient given CHG solution and benzoyl peroxide. Education given and patient verbalized understanding. Interpreter ID# [id number] Ishmael Holter) used for education.

## 2021-02-05 ENCOUNTER — Ambulatory Visit: Payer: BC Managed Care – PPO | Admitting: Orthopaedic Surgery

## 2021-02-05 ENCOUNTER — Other Ambulatory Visit (HOSPITAL_COMMUNITY): Payer: Self-pay

## 2021-02-06 ENCOUNTER — Encounter: Payer: BC Managed Care – PPO | Admitting: Orthopaedic Surgery

## 2021-02-12 ENCOUNTER — Ambulatory Visit (HOSPITAL_BASED_OUTPATIENT_CLINIC_OR_DEPARTMENT_OTHER): Payer: BC Managed Care – PPO | Admitting: Anesthesiology

## 2021-02-12 ENCOUNTER — Other Ambulatory Visit (HOSPITAL_COMMUNITY): Payer: Self-pay

## 2021-02-12 ENCOUNTER — Ambulatory Visit (HOSPITAL_BASED_OUTPATIENT_CLINIC_OR_DEPARTMENT_OTHER)
Admission: RE | Admit: 2021-02-12 | Discharge: 2021-02-12 | Disposition: A | Discharge status: Home / Self Care | Payer: BC Managed Care – PPO | Attending: Orthopaedic Surgery | Admitting: Orthopaedic Surgery

## 2021-02-12 ENCOUNTER — Other Ambulatory Visit: Payer: Self-pay

## 2021-02-12 ENCOUNTER — Encounter (HOSPITAL_BASED_OUTPATIENT_CLINIC_OR_DEPARTMENT_OTHER): Payer: Self-pay | Admitting: Orthopaedic Surgery

## 2021-02-12 ENCOUNTER — Encounter: Payer: Self-pay | Admitting: Orthopaedic Surgery

## 2021-02-12 ENCOUNTER — Encounter (HOSPITAL_BASED_OUTPATIENT_CLINIC_OR_DEPARTMENT_OTHER)
Admission: RE | Disposition: A | Discharge status: Home / Self Care | Payer: Self-pay | Source: Home / Self Care | Attending: Orthopaedic Surgery

## 2021-02-12 DIAGNOSIS — S43432A Superior glenoid labrum lesion of left shoulder, initial encounter: Secondary | ICD-10-CM | POA: Diagnosis not present

## 2021-02-12 DIAGNOSIS — E119 Type 2 diabetes mellitus without complications: Secondary | ICD-10-CM | POA: Diagnosis not present

## 2021-02-12 DIAGNOSIS — Z7984 Long term (current) use of oral hypoglycemic drugs: Secondary | ICD-10-CM | POA: Diagnosis not present

## 2021-02-12 DIAGNOSIS — Z7951 Long term (current) use of inhaled steroids: Secondary | ICD-10-CM | POA: Diagnosis not present

## 2021-02-12 DIAGNOSIS — X58XXXA Exposure to other specified factors, initial encounter: Secondary | ICD-10-CM | POA: Diagnosis not present

## 2021-02-12 DIAGNOSIS — Z6841 Body Mass Index (BMI) 40.0 and over, adult: Secondary | ICD-10-CM | POA: Diagnosis not present

## 2021-02-12 DIAGNOSIS — M94212 Chondromalacia, left shoulder: Secondary | ICD-10-CM | POA: Insufficient documentation

## 2021-02-12 DIAGNOSIS — Z8349 Family history of other endocrine, nutritional and metabolic diseases: Secondary | ICD-10-CM | POA: Diagnosis not present

## 2021-02-12 DIAGNOSIS — M7542 Impingement syndrome of left shoulder: Secondary | ICD-10-CM

## 2021-02-12 DIAGNOSIS — M75122 Complete rotator cuff tear or rupture of left shoulder, not specified as traumatic: Secondary | ICD-10-CM | POA: Insufficient documentation

## 2021-02-12 DIAGNOSIS — Z79899 Other long term (current) drug therapy: Secondary | ICD-10-CM | POA: Diagnosis not present

## 2021-02-12 DIAGNOSIS — Z833 Family history of diabetes mellitus: Secondary | ICD-10-CM | POA: Diagnosis not present

## 2021-02-12 DIAGNOSIS — Z8261 Family history of arthritis: Secondary | ICD-10-CM | POA: Diagnosis not present

## 2021-02-12 DIAGNOSIS — E669 Obesity, unspecified: Secondary | ICD-10-CM | POA: Insufficient documentation

## 2021-02-12 DIAGNOSIS — M75102 Unspecified rotator cuff tear or rupture of left shoulder, not specified as traumatic: Secondary | ICD-10-CM

## 2021-02-12 DIAGNOSIS — Z791 Long term (current) use of non-steroidal anti-inflammatories (NSAID): Secondary | ICD-10-CM | POA: Diagnosis not present

## 2021-02-12 DIAGNOSIS — Z8249 Family history of ischemic heart disease and other diseases of the circulatory system: Secondary | ICD-10-CM | POA: Diagnosis not present

## 2021-02-12 HISTORY — PX: SHOULDER ARTHROSCOPY WITH DISTAL CLAVICLE RESECTION: SHX5675

## 2021-02-12 HISTORY — PX: SHOULDER ARTHROSCOPY WITH ROTATOR CUFF REPAIR AND SUBACROMIAL DECOMPRESSION: SHX5686

## 2021-02-12 LAB — GLUCOSE, CAPILLARY
Glucose-Capillary: 142 mg/dL — ABNORMAL HIGH (ref 70–99)
Glucose-Capillary: 150 mg/dL — ABNORMAL HIGH (ref 70–99)

## 2021-02-12 SURGERY — SHOULDER ARTHROSCOPY WITH ROTATOR CUFF REPAIR AND SUBACROMIAL DECOMPRESSION
Anesthesia: General | Site: Shoulder | Laterality: Left

## 2021-02-12 MED ORDER — PROPOFOL 10 MG/ML IV BOLUS
INTRAVENOUS | Status: DC | PRN
  Administered 2021-02-12: 200 mg via INTRAVENOUS

## 2021-02-12 MED ORDER — EPHEDRINE 5 MG/ML INJ
INTRAVENOUS | Status: AC
  Filled 2021-02-12: qty 5

## 2021-02-12 MED ORDER — PHENYLEPHRINE HCL (PRESSORS) 10 MG/ML IV SOLN
INTRAVENOUS | Status: DC | PRN
  Administered 2021-02-12: 80 ug via INTRAVENOUS
  Administered 2021-02-12: 40 ug via INTRAVENOUS

## 2021-02-12 MED ORDER — FENTANYL CITRATE (PF) 100 MCG/2ML IJ SOLN
INTRAMUSCULAR | Status: DC | PRN
  Administered 2021-02-12 (×2): 25 ug via INTRAVENOUS

## 2021-02-12 MED ORDER — PROMETHAZINE HCL 25 MG/ML IJ SOLN
INTRAMUSCULAR | Status: AC
  Filled 2021-02-12: qty 1

## 2021-02-12 MED ORDER — LACTATED RINGERS IV SOLN: INTRAVENOUS | Status: DC

## 2021-02-12 MED ORDER — LIDOCAINE HCL (CARDIAC) PF 100 MG/5ML IV SOSY
PREFILLED_SYRINGE | INTRAVENOUS | Status: DC | PRN
  Administered 2021-02-12: 60 mg via INTRAVENOUS

## 2021-02-12 MED ORDER — MIDAZOLAM HCL 2 MG/2ML IJ SOLN
INTRAMUSCULAR | Status: AC
  Filled 2021-02-12: qty 2

## 2021-02-12 MED ORDER — FENTANYL CITRATE (PF) 100 MCG/2ML IJ SOLN
INTRAMUSCULAR | Status: AC
  Filled 2021-02-12: qty 2

## 2021-02-12 MED ORDER — HYDROMORPHONE HCL 1 MG/ML IJ SOLN
0.2500 mg | INTRAMUSCULAR | Status: DC | PRN
  Administered 2021-02-12: 0.5 mg via INTRAVENOUS

## 2021-02-12 MED ORDER — BUPIVACAINE HCL (PF) 0.5 % IJ SOLN
INTRAMUSCULAR | Status: DC | PRN
  Administered 2021-02-12: 20 mL via PERINEURAL

## 2021-02-12 MED ORDER — BUPIVACAINE-EPINEPHRINE (PF) 0.25% -1:200000 IJ SOLN
INTRAMUSCULAR | Status: AC
  Filled 2021-02-12: qty 30

## 2021-02-12 MED ORDER — CEFAZOLIN SODIUM-DEXTROSE 2-4 GM/100ML-% IV SOLN
2.0000 g | INTRAVENOUS | Status: AC
  Administered 2021-02-12: 2 g via INTRAVENOUS

## 2021-02-12 MED ORDER — MIDAZOLAM HCL 2 MG/2ML IJ SOLN
2.0000 mg | Freq: Once | INTRAMUSCULAR | Status: AC
  Administered 2021-02-12: 2 mg via INTRAVENOUS

## 2021-02-12 MED ORDER — OXYCODONE HCL 5 MG PO TABS: 5.0000 mg | ORAL_TABLET | Freq: Once | ORAL | Status: DC | PRN

## 2021-02-12 MED ORDER — MEPERIDINE HCL 25 MG/ML IJ SOLN: 6.2500 mg | INTRAMUSCULAR | Status: DC | PRN

## 2021-02-12 MED ORDER — SODIUM CHLORIDE 0.9 % IR SOLN
Status: DC | PRN
  Administered 2021-02-12: 9000 mL

## 2021-02-12 MED ORDER — CEFAZOLIN SODIUM-DEXTROSE 2-4 GM/100ML-% IV SOLN
INTRAVENOUS | Status: AC
  Filled 2021-02-12: qty 100

## 2021-02-12 MED ORDER — FENTANYL CITRATE (PF) 100 MCG/2ML IJ SOLN
100.0000 ug | Freq: Once | INTRAMUSCULAR | Status: AC
  Administered 2021-02-12: 100 ug via INTRAVENOUS

## 2021-02-12 MED ORDER — SUGAMMADEX SODIUM 500 MG/5ML IV SOLN
INTRAVENOUS | Status: AC
  Filled 2021-02-12: qty 10

## 2021-02-12 MED ORDER — ONDANSETRON HCL 4 MG/2ML IJ SOLN
INTRAMUSCULAR | Status: DC | PRN
  Administered 2021-02-12: 4 mg via INTRAVENOUS

## 2021-02-12 MED ORDER — PROMETHAZINE HCL 25 MG/ML IJ SOLN
6.2500 mg | INTRAMUSCULAR | Status: DC | PRN
  Administered 2021-02-12: 6.25 mg via INTRAVENOUS

## 2021-02-12 MED ORDER — ONDANSETRON HCL 4 MG/2ML IJ SOLN
INTRAMUSCULAR | Status: AC
  Filled 2021-02-12: qty 4

## 2021-02-12 MED ORDER — DEXAMETHASONE SODIUM PHOSPHATE 10 MG/ML IJ SOLN
INTRAMUSCULAR | Status: DC | PRN
  Administered 2021-02-12: 5 mg via INTRAVENOUS

## 2021-02-12 MED ORDER — OXYCODONE HCL 5 MG/5ML PO SOLN: 5.0000 mg | Freq: Once | ORAL | Status: DC | PRN

## 2021-02-12 MED ORDER — LIDOCAINE HCL (PF) 2 % IJ SOLN
INTRAMUSCULAR | Status: AC
  Filled 2021-02-12: qty 10

## 2021-02-12 MED ORDER — EPHEDRINE SULFATE 50 MG/ML IJ SOLN
INTRAMUSCULAR | Status: DC | PRN
  Administered 2021-02-12 (×2): 10 mg via INTRAVENOUS

## 2021-02-12 MED ORDER — OXYCODONE-ACETAMINOPHEN 5-325 MG PO TABS
1.0000 | ORAL_TABLET | Freq: Three times a day (TID) | ORAL | 0 refills | Status: DC | PRN
  Filled 2021-02-12: qty 30, 5d supply, fill #0

## 2021-02-12 MED ORDER — HYDROMORPHONE HCL 1 MG/ML IJ SOLN
INTRAMUSCULAR | Status: AC
  Filled 2021-02-12: qty 0.5

## 2021-02-12 MED ORDER — PHENYLEPHRINE 40 MCG/ML (10ML) SYRINGE FOR IV PUSH (FOR BLOOD PRESSURE SUPPORT)
PREFILLED_SYRINGE | INTRAVENOUS | Status: AC
  Filled 2021-02-12: qty 10

## 2021-02-12 MED ORDER — SUCCINYLCHOLINE CHLORIDE 200 MG/10ML IV SOSY
PREFILLED_SYRINGE | INTRAVENOUS | Status: AC
  Filled 2021-02-12: qty 10

## 2021-02-12 MED ORDER — PROPOFOL 10 MG/ML IV BOLUS
INTRAVENOUS | Status: AC
  Filled 2021-02-12: qty 20

## 2021-02-12 MED ORDER — BUPIVACAINE LIPOSOME 1.3 % IJ SUSP
INTRAMUSCULAR | Status: DC | PRN
  Administered 2021-02-12: 10 mL via PERINEURAL

## 2021-02-12 SURGICAL SUPPLY — 62 items
BENZOIN TINCTURE PRP APPL 2/3 (GAUZE/BANDAGES/DRESSINGS) IMPLANT
BLADE EXCALIBUR 4.0X13 (MISCELLANEOUS) IMPLANT
BLADE SURG 15 STRL LF DISP TIS (BLADE) IMPLANT
BLADE SURG 15 STRL SS (BLADE)
BURR OVAL 8 FLU 4.0X13 (MISCELLANEOUS) ×3 IMPLANT
CANNULA 5.75X71 LONG (CANNULA) ×3 IMPLANT
CANNULA SHOULDER 7CM (CANNULA) ×3 IMPLANT
CANNULA TWIST IN 8.25X7CM (CANNULA) ×3 IMPLANT
CLSR STERI-STRIP ANTIMIC 1/2X4 (GAUZE/BANDAGES/DRESSINGS) IMPLANT
COOLER ICEMAN CLASSIC (MISCELLANEOUS) ×3 IMPLANT
DECANTER SPIKE VIAL GLASS SM (MISCELLANEOUS) IMPLANT
DISSECTOR  3.8MM X 13CM (MISCELLANEOUS) ×3
DISSECTOR 3.8MM X 13CM (MISCELLANEOUS) ×2 IMPLANT
DRAPE IMP U-DRAPE 54X76 (DRAPES) ×3 IMPLANT
DRAPE INCISE IOBAN 66X45 STRL (DRAPES) ×3 IMPLANT
DRAPE STERI 35X30 U-POUCH (DRAPES) ×3 IMPLANT
DRAPE SURG 17X23 STRL (DRAPES) ×3 IMPLANT
DRAPE U-SHAPE 47X51 STRL (DRAPES) ×3 IMPLANT
DRAPE U-SHAPE 76X120 STRL (DRAPES) ×6 IMPLANT
DRSG PAD ABDOMINAL 8X10 ST (GAUZE/BANDAGES/DRESSINGS) ×6 IMPLANT
DURAPREP 26ML APPLICATOR (WOUND CARE) ×6 IMPLANT
ELECT REM PT RETURN 9FT ADLT (ELECTROSURGICAL)
ELECTRODE REM PT RTRN 9FT ADLT (ELECTROSURGICAL) IMPLANT
GAUZE SPONGE 4X4 12PLY STRL (GAUZE/BANDAGES/DRESSINGS) ×6 IMPLANT
GAUZE XEROFORM 1X8 LF (GAUZE/BANDAGES/DRESSINGS) ×3 IMPLANT
GLOVE SURG NEOP MICRO LF SZ7.5 (GLOVE) IMPLANT
GLOVE SURG SYN 7.5  E (GLOVE) ×6
GLOVE SURG SYN 7.5 E (GLOVE) ×4 IMPLANT
GLOVE SURG UNDER POLY LF SZ7 (GLOVE) ×3 IMPLANT
GLOVE SURG UNDER POLY LF SZ7.5 (GLOVE) ×3 IMPLANT
GOWN STRL REIN XL XLG (GOWN DISPOSABLE) ×6 IMPLANT
GOWN STRL REUS W/ TWL LRG LVL3 (GOWN DISPOSABLE) ×4 IMPLANT
GOWN STRL REUS W/ TWL XL LVL3 (GOWN DISPOSABLE) IMPLANT
GOWN STRL REUS W/TWL LRG LVL3 (GOWN DISPOSABLE) ×6
GOWN STRL REUS W/TWL XL LVL3 (GOWN DISPOSABLE)
IMPL ANCHOR PEEK 4.5MM (Anchor) ×2 IMPLANT
IMPLANT ANCHOR PEEK 4.5MM (Anchor) ×3 IMPLANT
MANIFOLD NEPTUNE II (INSTRUMENTS) ×3 IMPLANT
NEEDLE SCORPION MULTI FIRE (NEEDLE) IMPLANT
NEEDLE SUT PASSER RTC (NEEDLE) ×3 IMPLANT
PACK ARTHROSCOPY DSU (CUSTOM PROCEDURE TRAY) ×3 IMPLANT
PACK BASIN DAY SURGERY FS (CUSTOM PROCEDURE TRAY) ×3 IMPLANT
PAD COLD SHLDR WRAP-ON (PAD) ×3 IMPLANT
PAD ORTHO SHOULDER 7X19 LRG (SOFTGOODS) ×3 IMPLANT
PORT APPOLLO RF 90DEGREE MULTI (SURGICAL WAND) ×3 IMPLANT
SHEET MEDIUM DRAPE 40X70 STRL (DRAPES) ×3 IMPLANT
SLEEVE SCD COMPRESS KNEE MED (STOCKING) ×3 IMPLANT
SLING ARM FOAM STRAP LRG (SOFTGOODS) IMPLANT
SUT BROADBAND TAPE 2PK 1.5 (SUTURE) ×3 IMPLANT
SUT ETHILON 3 0 PS 1 (SUTURE) ×3 IMPLANT
SUT FIBERWIRE #2 38 T-5 BLUE (SUTURE)
SUT TIGER TAPE 7 IN WHITE (SUTURE) IMPLANT
SUTURE FIBERWR #2 38 T-5 BLUE (SUTURE) IMPLANT
SUTURE TAPE 1.3 40 TPR END (SUTURE) IMPLANT
SUTURE TAPE TIGERLINK 1.3MM BL (SUTURE) IMPLANT
SUTURETAPE 1.3 40 TPR END (SUTURE)
SUTURETAPE TIGERLINK 1.3MM BL (SUTURE)
SYR 50ML LL SCALE MARK (SYRINGE) ×3 IMPLANT
TAPE FIBER 2MM 7IN #2 BLUE (SUTURE) IMPLANT
TOWEL GREEN STERILE FF (TOWEL DISPOSABLE) ×3 IMPLANT
TUBE CONNECTING 20X1/4 (TUBING) ×3 IMPLANT
TUBING ARTHROSCOPY IRRIG 16FT (MISCELLANEOUS) ×3 IMPLANT

## 2021-02-12 NOTE — Progress Notes (Signed)
Assisted Dr. Miller with left, ultrasound guided, interscalene  block. Side rails up, monitors on throughout procedure. See vital signs in flow sheet. Tolerated Procedure well.  

## 2021-02-12 NOTE — Anesthesia Preprocedure Evaluation (Signed)
Anesthesia Evaluation  Patient identified by MRN, date of birth, ID band Patient awake    Reviewed: Allergy & Precautions, NPO status , Patient's Chart, lab work & pertinent test results  History of Anesthesia Complications (+) PONV and history of anesthetic complications  Airway Mallampati: II  TM Distance: >3 FB Neck ROM: Full    Dental  (+) Teeth Intact, Dental Advisory Given Braces :   Pulmonary asthma ,    Pulmonary exam normal breath sounds clear to auscultation       Cardiovascular negative cardio ROS Normal cardiovascular exam Rhythm:Regular Rate:Normal     Neuro/Psych  Headaches, PSYCHIATRIC DISORDERS Anxiety Depression    GI/Hepatic Neg liver ROS, GERD  ,  Endo/Other  diabetes, Type 2, Oral Hypoglycemic AgentsMorbid obesity  Renal/GU negative Renal ROS     Musculoskeletal  (+) Arthritis , Osteoarthritis,    Abdominal (+) + obese,   Peds  Hematology  (+) Blood dyscrasia, anemia ,   Anesthesia Other Findings Day of surgery medications reviewed with the patient.  Reproductive/Obstetrics                             Anesthesia Physical  Anesthesia Plan  ASA: III  Anesthesia Plan: General   Post-op Pain Management:  Regional for Post-op pain   Induction: Intravenous  PONV Risk Score and Plan: 4 or greater and Treatment may vary due to age or medical condition, Ondansetron, Dexamethasone, Midazolam and Droperidol  Airway Management Planned: LMA  Additional Equipment:   Intra-op Plan:   Post-operative Plan: Extubation in OR  Informed Consent: I have reviewed the patients History and Physical, chart, labs and discussed the procedure including the risks, benefits and alternatives for the proposed anesthesia with the patient or authorized representative who has indicated his/her understanding and acceptance.     Dental advisory given  Plan Discussed with: CRNA and  Anesthesiologist  Anesthesia Plan Comments: (  )        Anesthesia Quick Evaluation

## 2021-02-12 NOTE — Anesthesia Procedure Notes (Signed)
Anesthesia Regional Block: Interscalene brachial plexus block   Pre-Anesthetic Checklist: , timeout performed,  Correct Patient, Correct Site, Correct Laterality,  Correct Procedure, Correct Position, site marked,  Risks and benefits discussed,  Surgical consent,  Pre-op evaluation,  At surgeon's request and post-op pain management  Laterality: Left  Prep: chloraprep       Needles:   Needle Type: Stimiplex     Needle Length: 9cm  Needle Gauge: 21     Additional Needles:   Procedures:,,,, ultrasound used (permanent image in chart),,    Narrative:  Start time: 02/12/2021 1:22 PM End time: 02/12/2021 1:27 PM Injection made incrementally with aspirations every 5 mL.  Performed by: Personally  Anesthesiologist: Lynda Rainwater, MD

## 2021-02-12 NOTE — Transfer of Care (Signed)
Immediate Anesthesia Transfer of Care Note  Patient: Thaiz Trost  Procedure(s) Performed: LEFT SHOULDER ARTHROSCOPY WITH ROTATOR CUFF REPAIR, SUBACROMIAL DECOMPRESSION, EXTENSIVE DEBRIDEMENT (Left: Shoulder) SHOULDER ARTHROSCOPY WITH DISTAL CLAVICLE RESECTION (Shoulder)  Patient Location: PACU  Anesthesia Type:GA combined with regional for post-op pain  Level of Consciousness: drowsy  Airway & Oxygen Therapy: Patient Spontanous Breathing and Patient connected to face mask oxygen  Post-op Assessment: Report given to RN and Post -op Vital signs reviewed and stable  Post vital signs: Reviewed and stable  Last Vitals:  Vitals Value Taken Time  BP 142/86 02/12/21 1554  Temp 36.6 C 02/12/21 1554  Pulse 92 02/12/21 1557  Resp 17 02/12/21 1557  SpO2 100 % 02/12/21 1557  Vitals shown include unvalidated device data.  Last Pain:  Vitals:   02/12/21 1203  TempSrc: Oral  PainSc: 8       Patients Stated Pain Goal: 9 ([id number])  Complications: No notable events documented.

## 2021-02-12 NOTE — Discharge Instructions (Addendum)
Post-operative patient instructions  Shoulder Arthroscopy   Ice:  Place intermittent ice or cooler pack over your shoulder, 30 minutes on and 30 minutes off.  Continue this for the first 72 hours after surgery, then save ice for use after therapy sessions or on more active days.   Weight:  You may NOT bear weight on your arm as your symptoms allow. Dressing:  Perform 1st dressing change at 2 days postoperative. A moderate amount of blood tinged drainage is to be expected.  So if you bleed through the dressing on the first or second day or if you have fevers, it is fine to change the dressing/check the wounds early and redress wound.  If it bleeds through again, or if the incisions are leaking frank blood, please call the office. May change dressing every 1-2 days thereafter to help watch wounds. Can purchase Tegaderm (or 54M Nexcare) water resistant dressings at local pharmacy / [facility]. Shower:  Light shower is ok after 2 days.  Please take shower, NO bath. Recover with gauze and ace wrap to help keep wounds protected.   Pain medication:  A narcotic pain medication has been prescribed.  Take as directed.  Typically you need narcotic pain medication more regularly during the first 3 to 5 days after surgery.  Decrease your use of the medication as the pain improves.  Narcotics can sometimes cause constipation, even after a few doses.  If you have problems with constipation, you can take an over the counter stool softener or light laxative.  If you have persistent problems, please notify your physician's office. Physical therapy: Additional activity guidelines to be provided by your physician or physical therapist at follow-up visits.  Driving: Do not recommend driving x 2 weeks post surgical, especially if surgery performed on right side. Should not drive while taking narcotic pain medications. It typically takes at least 2 weeks to restore sufficient neuromuscular function for normal reaction times for  driving safety.  Call [phone] for questions or problems. Evenings you will be forwarded to the hospital operator.  Ask for the orthopaedic physician on call. Please call if you experience:    Redness, foul smelling, or persistent drainage from the surgical site  worsening shoulder pain and swelling not responsive to medication  any calf pain and or swelling of the lower leg  temperatures greater than 101.5 F other questions or concerns   Thank you for allowing [place] to be a part of your care.   Post Anesthesia Home Care Instructions  Activity: Get plenty of rest for the remainder of the day. A responsible individual must stay with you for 24 hours following the procedure.  For the next 24 hours, DO NOT: -Drive a car -Paediatric nurse -Drink alcoholic beverages -Take any medication unless instructed by your physician -Make any legal decisions or sign important papers.  Meals: Start with liquid foods such as gelatin or soup. Progress to regular foods as tolerated. Avoid greasy, spicy, heavy foods. If nausea and/or vomiting occur, drink only clear liquids until the nausea and/or vomiting subsides. Call your physician if vomiting continues.  Special Instructions/Symptoms: Your throat may feel dry or sore from the anesthesia or the breathing tube placed in your throat during surgery. If this causes discomfort, gargle with warm salt water. The discomfort should disappear within 24 hours.  If you had a scopolamine patch placed behind your ear for the management of post- operative nausea and/or vomiting:  1. The medication in the patch is effective  for 72 hours, after which it should be removed.  Wrap patch in a tissue and discard in the trash. Wash hands thoroughly with soap and water. 2. You may remove the patch earlier than 72 hours if you experience unpleasant side effects which may include dry mouth, dizziness or visual disturbances. 3. Avoid touching the patch. Wash your hands with  soap and water after contact with the patch.    Regional Anesthesia Blocks  1. Numbness or the inability to move the "blocked" extremity may last from 3-48 hours after placement. The length of time depends on the medication injected and your individual response to the medication. If the numbness is not going away after 48 hours, call your surgeon.  2. The extremity that is blocked will need to be protected until the numbness is gone and the  Strength has returned. Because you cannot feel it, you will need to take extra care to avoid injury. Because it may be weak, you may have difficulty moving it or using it. You may not know what position it is in without looking at it while the block is in effect.  3. For blocks in the legs and feet, returning to weight bearing and walking needs to be done carefully. You will need to wait until the numbness is entirely gone and the strength has returned. You should be able to move your leg and foot normally before you try and bear weight or walk. You will need someone to be with you when you first try to ensure you do not fall and possibly risk injury.  4. Bruising and tenderness at the needle site are common side effects and will resolve in a few days.  5. Persistent numbness or new problems with movement should be communicated to the surgeon or the [place] [phone] [place] [phone]). Information for Discharge Teaching: EXPAREL (bupivacaine liposome injectable suspension)   Your surgeon or anesthesiologist gave you EXPAREL(bupivacaine) to help control your pain after surgery.  EXPAREL is a local anesthetic that provides pain relief by numbing the tissue around the surgical site. EXPAREL is designed to release pain medication over time and can control pain for up to 72 hours. Depending on how you respond to EXPAREL, you may require less pain medication during your recovery.  Possible side effects: Temporary loss of  sensation or ability to move in the area where bupivacaine was injected. Nausea, vomiting, constipation Rarely, numbness and tingling in your mouth or lips, lightheadedness, or anxiety may occur. Call your doctor right away if you think you may be experiencing any of these sensations, or if you have other questions regarding possible side effects.  Follow all other discharge instructions given to you by your surgeon or nurse. Eat a healthy diet and drink plenty of water or other fluids.  If you return to the hospital for any reason within 96 hours following the administration of EXPAREL, it is important for health care providers to know that you have received this anesthetic. A teal colored band has been placed on your arm with the date, time and amount of EXPAREL you have received in order to alert and inform your health care providers. Please leave this armband in place for the full 96 hours following administration, and then you may remove the band.

## 2021-02-12 NOTE — Anesthesia Procedure Notes (Signed)
Procedure Name: LMA Insertion Date/Time: 02/12/2021 2:28 PM Performed by: Lavonia Dana, CRNA Pre-anesthesia Checklist: Patient identified, Emergency Drugs available, Suction available and Patient being monitored Patient Re-evaluated:Patient Re-evaluated prior to induction Oxygen Delivery Method: Circle system utilized Preoxygenation: Pre-oxygenation with 100% oxygen Induction Type: IV induction Ventilation: Mask ventilation without difficulty LMA: LMA inserted LMA Size: 4.0 Number of attempts: 1 Airway Equipment and Method: Bite block Placement Confirmation: positive ETCO2 Tube secured with: Tape Dental Injury: Teeth and Oropharynx as per pre-operative assessment

## 2021-02-12 NOTE — H&P (Signed)
PREOPERATIVE H&P  Chief Complaint: left rotator cuff tear  HPI: Alexandra Norris is a 45 y.o. female who presents for surgical treatment of left rotator cuff tear.  She denies any changes in medical history.  Past Medical History:  Diagnosis Date   Anemia    Anxiety    Arthritis    Asthma    Carpal tunnel syndrome    Depression    Dyspnea    Endometriosis    History of blood transfusion 1999   w/vaginal delivery   History of bronchitis    "I've stayed in hospital 2 X w/bronchitis"   Migraine    Pneumonia    PONV (postoperative nausea and vomiting)    Pre-diabetes 10/03/2019   A1C 6.4   Past Surgical History:  Procedure Laterality Date   ABDOMINAL HYSTERECTOMY  10/17/2019   APPENDECTOMY     CARPAL TUNNEL RELEASE Right 07/19/2019   Procedure: RIGHT CARPAL TUNNEL RELEASE;  Surgeon: Leandrew Koyanagi, MD;  Location: [facility];  Service: Orthopedics;  Laterality: Right;   CARPAL TUNNEL RELEASE Left 09/13/2019   Procedure: LEFT CARPAL TUNNEL RELEASE;  Surgeon: Leandrew Koyanagi, MD;  Location: [facility];  Service: Orthopedics;  Laterality: Left;  Bier block   CESAREAN SECTION  1997; 2008   CHOLECYSTECTOMY  2011   TONSILLECTOMY     tonsilletomy     TUBAL LIGATION  04/2007   VAGINAL HYSTERECTOMY Bilateral 10/17/2019   Procedure: HYSTERECTOMY VAGINAL WITH SALPINGECTOMY;  Surgeon: Chancy Milroy, MD;  Location: [facility];  Service: Gynecology;  Laterality: Bilateral;   Social History   Socioeconomic History   Marital status: Married    Spouse name: Not on file   Number of children: 3   Years of education: Not on file   Highest education level: 12th grade  Occupational History   Not on file  Tobacco Use   Smoking status: Never   Smokeless tobacco: Never  Vaping Use   Vaping Use: Never used  Substance and Sexual Activity   Alcohol use: No   Drug use: No   Sexual activity: Yes    Birth control/protection: Surgical  Other Topics  Concern   Not on file  Social History Narrative   Not on file   Social Determinants of Health   Financial Resource Strain: Not on file  Food Insecurity: Not on file  Transportation Needs: Not on file  Physical Activity: Not on file  Stress: Not on file  Social Connections: Not on file   Family History  Problem Relation Age of Onset   Diabetes Mother    Osteoarthritis Mother    Hypertension Mother    Hyperlipidemia Mother    Arthritis Mother    No Known Allergies Prior to Admission medications   Medication Sig Start Date End Date Taking? Authorizing Provider  acetaminophen (TYLENOL) 325 MG tablet Take 650 mg by mouth every 6 (six) hours as needed.   Yes [provider]  Blood Glucose Monitoring Suppl (CONTOUR NEXT MONITOR) w/Device KIT USE SEGUN LO INDICADO 3 VECES AL DIA ANTES DE LAS COMIDAS 08/05/20 08/05/21 Yes Newlin, Enobong, MD  Blood Glucose Monitoring Suppl (TRUE METRIX METER) DEVI 1 each by Does not apply route 3 (three) times daily before meals. 08/05/20  Yes Charlott Rakes, MD  diclofenac (VOLTAREN) 50 MG EC tablet Take 1 tablet (50 mg total) by mouth 2 (two) times daily. 12/12/20  Yes Jessy Oto, MD  glucose blood (TRUE METRIX BLOOD  GLUCOSE TEST) test strip Use 3 times daily before meals 08/05/20  Yes Newlin, Enobong, MD  glucose blood test strip USE SEGUN LO INDICADO 3 VECES AL DIA ANTES DE LAS COMIDAS Patient taking differently: USE SEGUN LO INDICADO 3 VECES AL DIA ANTES DE LAS COMIDAS 08/05/20 08/05/21 Yes Newlin, Enobong, MD  HYDROcodone-acetaminophen (NORCO) 5-325 MG tablet Take 1-2 tablets by mouth 3 (three) times daily as needed. 01/28/21  Yes Aundra Dubin, PA-C  metFORMIN (GLUCOPHAGE) 500 MG tablet TAKE 1 TABLET WITH BREAKFAST AND 2 TABLETS WITH DINNER Patient taking differently: TAKE 1 TABLET WITH BREAKFAST AND 2 TABLETS WITH DINNER 09/09/20  Yes Early, Coralee Pesa, NP  metFORMIN (GLUCOPHAGE) 500 MG tablet TAKE 1 TABLET (500 MG TOTAL) BY MOUTH 2 (TWO) TIMES  DAILY WITH A MEAL. 08/05/20 08/05/21 Yes Charlott Rakes, MD  Microlet Lancets MISC Use to check blood sugar TID. 08/06/20  Yes Charlott Rakes, MD  Microlet Lancets MISC USE TO CHECK BLOOD SUGAR 3 VECES AL DIA 08/06/20 08/06/21 Yes Charlott Rakes, MD  Multiple Vitamin (MULTIVITAMIN WITH MINERALS) TABS tablet Take 1 tablet by mouth daily in the afternoon.   Yes [provider]  naproxen (NAPROSYN) 500 MG tablet Take 1 tablet (500 mg total) by mouth 2 (two) times daily with a meal. 01/31/21  Yes Larene Pickett, PA-C  traMADol-acetaminophen (ULTRACET) 37.5-325 MG tablet Take 1 tablet by mouth every 6 (six) hours as needed. 01/17/21  Yes Jessy Oto, MD  TRUEplus Lancets 28G MISC USE SEGUN LO INDICADO 3 VECES AL DIA ANTES DE LAS COMIDAS 08/05/20 08/05/21 Yes Newlin, Charlane Ferretti, MD  albuterol (VENTOLIN HFA) 108 (90 Base) MCG/ACT inhaler Inhale 2 puffs into the lungs in the morning and at bedtime. 09/09/20   Orma Render, NP  atorvastatin (LIPITOR) 20 MG tablet TAKE 1 TABLET (20 MG TOTAL) BY MOUTH DAILY. 08/05/20 08/05/21  Charlott Rakes, MD  cyclobenzaprine (FLEXERIL) 10 MG tablet TOME 1 PASTILLA POR VIA ORAL 3 VECES AL DIA 04/26/20 04/26/21  Costella, Vista Mink, PA-C  doxycycline (VIBRAMYCIN) 100 MG capsule Take 1 capsule (100 mg total) by mouth 2 (two) times daily. 01/03/21   Hughie Closs, PA-C  gabapentin (NEURONTIN) 100 MG capsule Take 1 tab (115m) up to every 8 hours during the day as needed for pain.  Take 3 tab (3074m at bedtime as needed for pain. 11/14/20   EaOrma RenderNP  ondansetron (ZOFRAN) 4 MG tablet Take 1 tablet (4 mg total) by mouth every 8 (eight) hours as needed for nausea or vomiting. 01/28/21   StAundra DubinPA-C  valACYclovir (VALTREX) 1000 MG tablet Take 1 tablet (1,000 mg total) by mouth 3 (three) times daily for 7 days. 11/14/20 11/26/20  EaOrma RenderNP     Positive ROS: All other systems have been reviewed and were otherwise negative with the exception of those  mentioned in the HPI and as above.  Physical Exam: General: Alert, no acute distress Cardiovascular: No pedal edema Respiratory: No cyanosis, no use of accessory musculature GI: abdomen soft Skin: No lesions in the area of chief complaint Neurologic: Sensation intact distally Psychiatric: Patient is competent for consent with normal mood and affect Lymphatic: no lymphedema  MUSCULOSKELETAL: exam stable  Assessment: left rotator cuff tear  Plan: Plan for Procedure(s): LEFT SHOULDER ARTHROSCOPY WITH ROTATOR CUFF REPAIR, SUBACROMIAL DECOMPRESSION, EXTENSIVE DEBRIDEMENT  The risks benefits and alternatives were discussed with the patient including but not limited to the risks of nonoperative treatment, versus surgical intervention  including infection, bleeding, nerve injury,  blood clots, cardiopulmonary complications, morbidity, mortality, among others, and they were willing to proceed.   Preoperative templating of the joint replacement has been completed, documented, and submitted to the Operating Room personnel in order to optimize intra-operative equipment management.   Eduard Roux, MD 02/12/2021 11:28 AM

## 2021-02-12 NOTE — Op Note (Signed)
   Date of Surgery: 02/12/2021  INDICATIONS: The patient is a 45 year old female with chronic left shoulder pain that failed conservative treatment.  She was found to have a high-grade near full-thickness supraspinatus tear on MRI.  The patient did consent to the procedure after discussion of the risks and benefits.  PREOPERATIVE DIAGNOSIS:  1.  Full-thickness tear with mild retraction of left supraspinatus 2.  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  Grade 2 glenohumeral chondromalacia 4.  Left shoulder impingement syndrome  POSTOPERATIVE DIAGNOSIS: Same.  PROCEDURE:  1.  Left shoulder arthroscopy with rotator cuff repair of supraspinatus tendon 2.  Left shoulder arthroscopy with extensive debridement including labrum, biceps tenotomy, supraspinatus tendon, subdeltoid and subacromial bursitis 3.  Left shoulder arthroscopy with subacromial decompression and acromioplasty  SURGEON: N. Eduard Roux, M.D.  ASSIST: Madalyn Rob, PA-C  ANESTHESIA:  general, regional  IV FLUIDS AND URINE: See anesthesia.  ESTIMATED BLOOD LOSS: minimal mL.  IMPLANTS: [place] Quattro 5.5 mm anchor  COMPLICATIONS: None.  DESCRIPTION OF PROCEDURE: The patient was brought to the operating room and placed supine on the operating table.  The patient had been signed prior to the procedure and this was documented. The patient had the anesthesia placed by the anesthesiologist.  A time-out was performed to confirm that this was the correct patient, site, side and location. The patient did receive antibiotics prior to the incision and was re-dosed during the procedure as needed at indicated intervals.  The patient was then positioned into the beach chair position with all bony prominences well padded and neutral C spine. The patient had the operative extremity prepped and draped in the standard surgical fashion.    Incisions were made for shoulder arthroscopy portals.  Diagnostic  shoulder arthroscopy was first performed which revealed grade II chondromalacia in central area of the glenoid surface.  The humeral surface was unremarkable.  She did have a degenerative type II SLAP tear with tendinopathy of the biceps tendon.  We found a near full-thickness tear of the supraspinatus when we looked up from the glenohumeral joint.  Debridements worse first performed for the SLAP tear and biceps tenotomy was performed.  The stump was debrided back to stable margins.  The glenoid surface did not require any debridements.  The subscapularis tendon was unremarkable.  We then reposition the arthroscope into the subacromial space.  The subacromial decompression with acromioplasty was performed.  The CA ligament was left intact.  The [facility] joint was unremarkable.  We then created a lateral portal into the subacromial space.  The bursa's were debrided away and we found the full-thickness crescent-shaped tear of the supraspinatus with minimal retraction.  The tendon was prepared and debrided back to healthy border.  The bone was prepared using a high-speed bur.  A single row repair was performed using 2 suture tapes that created 4 strands that was anchored into a single 5.5 mm anchor.  The bone quality was excellent.  I was very happy with the watertight repair.  There was good approximation of the tendon down to the bleeding bone. Tawanna Cooler, my PA, was a medical necessity for the entirety of the surgery including opening, closing, limb positioning, retracting, exposing, and repairing.  POSTOPERATIVE PLAN: Patient will be discharged home.  She will need to wear the sling immobilizer with abduction pillow for 6 weeks.  Azucena Cecil, MD [facility] [phone] 3:33 PM

## 2021-02-13 ENCOUNTER — Encounter (HOSPITAL_BASED_OUTPATIENT_CLINIC_OR_DEPARTMENT_OTHER): Payer: Self-pay | Admitting: Orthopaedic Surgery

## 2021-02-13 NOTE — Anesthesia Postprocedure Evaluation (Signed)
Anesthesia Post Note  Patient: Alexandra Norris  Procedure(s) Performed: LEFT SHOULDER ARTHROSCOPY WITH ROTATOR CUFF REPAIR, SUBACROMIAL DECOMPRESSION, EXTENSIVE DEBRIDEMENT (Left: Shoulder) SHOULDER ARTHROSCOPY WITH DISTAL CLAVICLE RESECTION (Shoulder)     Patient location during evaluation: PACU Anesthesia Type: General Level of consciousness: awake and alert Pain management: pain level controlled Vital Signs Assessment: post-procedure vital signs reviewed and stable Respiratory status: spontaneous breathing, nonlabored ventilation and respiratory function stable Cardiovascular status: blood pressure returned to baseline and stable Postop Assessment: no apparent nausea or vomiting Anesthetic complications: no   No notable events documented.  Last Vitals:  Vitals:   02/12/21 1630 02/12/21 1652  BP: 138/81 (!) 146/75  Pulse: 95 74  Resp: 18 17  Temp:  36.7 C  SpO2: 94% 94%    Last Pain:  Vitals:   02/12/21 1652  TempSrc:   PainSc: 2                  Lynda Rainwater

## 2021-02-17 ENCOUNTER — Other Ambulatory Visit (HOSPITAL_COMMUNITY): Payer: Self-pay

## 2021-02-17 ENCOUNTER — Other Ambulatory Visit: Payer: Self-pay | Admitting: Physician Assistant

## 2021-02-17 ENCOUNTER — Telehealth: Payer: Self-pay | Admitting: Orthopaedic Surgery

## 2021-02-17 MED ORDER — HYDROCODONE-ACETAMINOPHEN 5-325 MG PO TABS
1.0000 | ORAL_TABLET | Freq: Three times a day (TID) | ORAL | 0 refills | Status: DC | PRN
  Filled 2021-02-17: qty 30, 5d supply, fill #0

## 2021-02-17 NOTE — Telephone Encounter (Signed)
Pt husband called and states pt would like something else for pain. The pt states that it makes her very itchy.   CB [phone]

## 2021-02-17 NOTE — Telephone Encounter (Signed)
Looks like we sent in oxy last time, so I will send in norco which was sent in prior to surgery

## 2021-02-18 ENCOUNTER — Telehealth: Payer: Self-pay | Admitting: Orthopaedic Surgery

## 2021-02-18 NOTE — Telephone Encounter (Signed)
Pt's husband calling stating pt is very itchy and might be having an allergic reaction to her pain medication. Pt does have a post op follow up sch for tomorrow with Dr. Erlinda Hong does not Sylvester Harder if she needs to be worked in today to be seen sooner and she if it is medication causing this. The best call back number is [phone].

## 2021-02-18 NOTE — Telephone Encounter (Signed)
See other msg

## 2021-02-18 NOTE — Telephone Encounter (Signed)
Patient aware.

## 2021-02-19 ENCOUNTER — Other Ambulatory Visit (HOSPITAL_COMMUNITY): Payer: Self-pay

## 2021-02-19 ENCOUNTER — Encounter: Payer: Self-pay | Admitting: Orthopaedic Surgery

## 2021-02-19 ENCOUNTER — Ambulatory Visit (INDEPENDENT_AMBULATORY_CARE_PROVIDER_SITE_OTHER): Payer: BC Managed Care – PPO | Admitting: Physician Assistant

## 2021-02-19 ENCOUNTER — Other Ambulatory Visit: Payer: Self-pay

## 2021-02-19 DIAGNOSIS — Z9889 Other specified postprocedural states: Secondary | ICD-10-CM

## 2021-02-19 MED ORDER — METHOCARBAMOL 500 MG PO TABS
500.0000 mg | ORAL_TABLET | Freq: Two times a day (BID) | ORAL | 0 refills | Status: DC | PRN
  Filled 2021-02-19: qty 20, 10d supply, fill #0

## 2021-02-19 MED ORDER — OXYCODONE-ACETAMINOPHEN 5-325 MG PO TABS
1.0000 | ORAL_TABLET | Freq: Three times a day (TID) | ORAL | 0 refills | Status: DC | PRN
  Filled 2021-02-19: qty 40, 7d supply, fill #0

## 2021-02-19 MED ORDER — IBUPROFEN 800 MG PO TABS
800.0000 mg | ORAL_TABLET | Freq: Three times a day (TID) | ORAL | 0 refills | Status: DC | PRN
  Filled 2021-02-19: qty 30, 10d supply, fill #0

## 2021-02-19 MED ORDER — ONDANSETRON HCL 4 MG PO TABS
4.0000 mg | ORAL_TABLET | Freq: Three times a day (TID) | ORAL | 0 refills | Status: DC | PRN
  Filled 2021-02-19: qty 40, 14d supply, fill #0

## 2021-02-19 NOTE — Progress Notes (Signed)
Post-Op Visit Note   Patient: Alexandra Norris           Date of Birth: July 15, 1975           MRN: [id number] Visit Date: 02/19/2021 PCP: Orma Render, NP   Assessment & Plan:  Chief Complaint:  Chief Complaint  Patient presents with   Left Shoulder - Post-op Follow-up   Visit Diagnoses:  1. S/P arthroscopy of left shoulder     Plan: Patient is a pleasant 45 year old female who comes in today 1 week out left shoulder arthroscopic debridement, decompression rotator cuff repair supraspinatus, date of surgery 02/12/2021.  She has been doing okay.  She has been in a moderate amount of pain.  He is taking oxycodone which initially caused itching and nausea but tried it again yesterday without any side effects.  She has been compliant wearing her sling with abduction pillow.  Examination of the left shoulder reveals well-healed portals without evidence of infection or cellulitis.  Nylon sutures in place.  Fingers are warm well perfused.  Today, sutures were removed and Steri-Strips applied.  Internal referral has been made for physical therapy.  She will continue wearing the sling for another 5 weeks.  I refilled her medicine.  Follow-up with [place] in 5 weeks time for recheck.  Call with concerns or questions.  Follow-Up Instructions: Return in about 5 weeks (around 03/26/2021).   Orders:  No orders of the defined types were placed in this encounter.  No orders of the defined types were placed in this encounter.   Imaging: No new imaging  PMFS History: Patient Active Problem List   Diagnosis Date Noted   Nontraumatic tear of left supraspinatus tendon 02/12/2021   Impingement syndrome of left shoulder    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   Shingles 11/14/2020   Neck pain 10/14/2020   Encounter to establish care 09/09/2020   Fatty liver disease, nonalcoholic [id number]   Elevated blood-pressure reading, without diagnosis of hypertension 06/17/2020   Influenza  vaccine needed 04/03/2020   Cervical disc disorder at C5-C6 level with radiculopathy 04/03/2020   S/P carpal tunnel release 08/30/2019   Right carpal tunnel syndrome 07/11/2019   Left carpal tunnel syndrome 07/11/2019   Arthritis    Anemia    Seasonal allergies 10/08/2016   Nasal obstruction 02/18/2016   DM2 (diabetes mellitus, type 2) ([place]) 10/21/2015   Bilateral chronic knee pain 09/25/2015   GASTROESOPHAGEAL REFLUX DISEASE 08/14/2009   DEPRESSION, SITUATIONAL, PROLONGED 02/05/2009   Body mass index (BMI) 45.0-49.9, adult ([place]) 05/30/2007   Moderate persistent asthma with acute exacerbation 12/06/2006   Past Medical History:  Diagnosis Date   Anemia    Anxiety    Arthritis    Asthma    Carpal tunnel syndrome    Depression    Dyspnea    Endometriosis    History of blood transfusion 1999   w/vaginal delivery   History of bronchitis    "I've stayed in hospital 2 X w/bronchitis"   Migraine    Pneumonia    PONV (postoperative nausea and vomiting)    Pre-diabetes 10/03/2019   A1C 6.4    Family History  Problem Relation Age of Onset   Diabetes Mother    Osteoarthritis Mother    Hypertension Mother    Hyperlipidemia Mother    Arthritis Mother     Past Surgical History:  Procedure Laterality Date   ABDOMINAL HYSTERECTOMY  10/17/2019   APPENDECTOMY     CARPAL TUNNEL RELEASE Right 07/19/2019  Procedure: RIGHT CARPAL TUNNEL RELEASE;  Surgeon: Leandrew Koyanagi, MD;  Location: [place];  Service: Orthopedics;  Laterality: Right;   CARPAL TUNNEL RELEASE Left 09/13/2019   Procedure: LEFT CARPAL TUNNEL RELEASE;  Surgeon: Leandrew Koyanagi, MD;  Location: [place];  Service: Orthopedics;  Laterality: Left;  Bier block   [place]; 2008   CHOLECYSTECTOMY  2011   SHOULDER ARTHROSCOPY WITH DISTAL CLAVICLE RESECTION  02/12/2021   Procedure: SHOULDER ARTHROSCOPY WITH DISTAL CLAVICLE RESECTION;  Surgeon: Leandrew Koyanagi, MD;  Location: [place];  Service: Orthopedics;;   SHOULDER ARTHROSCOPY WITH ROTATOR CUFF REPAIR AND SUBACROMIAL DECOMPRESSION Left 02/12/2021   Procedure: LEFT SHOULDER ARTHROSCOPY WITH ROTATOR CUFF REPAIR, SUBACROMIAL DECOMPRESSION, EXTENSIVE DEBRIDEMENT;  Surgeon: Leandrew Koyanagi, MD;  Location: [place];  Service: Orthopedics;  Laterality: Left;   TONSILLECTOMY     tonsilletomy     TUBAL LIGATION  04/2007   VAGINAL HYSTERECTOMY Bilateral 10/17/2019   Procedure: HYSTERECTOMY VAGINAL WITH SALPINGECTOMY;  Surgeon: Chancy Milroy, MD;  Location: [employer] [facility];  Service: Gynecology;  Laterality: Bilateral;   Social History   Occupational History   Not on file  Tobacco Use   Smoking status: Never   Smokeless tobacco: Never  Vaping Use   Vaping Use: Never used  Substance and Sexual Activity   Alcohol use: No   Drug use: No   Sexual activity: Yes    Birth control/protection: Surgical

## 2021-02-21 ENCOUNTER — Ambulatory Visit (INDEPENDENT_AMBULATORY_CARE_PROVIDER_SITE_OTHER): Payer: BC Managed Care – PPO | Admitting: Rehabilitative and Restorative Service Providers"

## 2021-02-21 ENCOUNTER — Other Ambulatory Visit: Payer: Self-pay

## 2021-02-21 ENCOUNTER — Encounter: Payer: Self-pay | Admitting: Rehabilitative and Restorative Service Providers"

## 2021-02-21 DIAGNOSIS — R600 Localized edema: Secondary | ICD-10-CM | POA: Diagnosis not present

## 2021-02-21 DIAGNOSIS — M6281 Muscle weakness (generalized): Secondary | ICD-10-CM

## 2021-02-21 DIAGNOSIS — G8929 Other chronic pain: Secondary | ICD-10-CM

## 2021-02-21 DIAGNOSIS — M25512 Pain in left shoulder: Secondary | ICD-10-CM | POA: Diagnosis not present

## 2021-02-21 DIAGNOSIS — R293 Abnormal posture: Secondary | ICD-10-CM | POA: Diagnosis not present

## 2021-02-21 NOTE — Patient Instructions (Signed)
Access Code: [email or web address] URL: [email or web address] Date: 02/21/2021 Prepared by: Scot Jun  Exercises Seated Scapular Retraction - 2 x daily - 7 x weekly - 1 sets - 10-15 reps - 5 hold Seated Elbow Flexion and Extension AROM (Mirrored) - 2 x daily - 7 x weekly - 1 sets - 10-15 reps Circular Shoulder Pendulum with Table Support - 1 x daily - 7 x weekly - 3 sets - 10 reps Standing Circular Shoulder Pendulum Supported with Arm Bent (Mirrored) - 2 x daily - 7 x weekly - 3 sets - 10 reps Standing Flexion Extension Shoulder Pendulum Supported with Arm Bent (Mirrored) - 2 x daily - 7 x weekly - 3 sets - 10 reps

## 2021-02-21 NOTE — Therapy (Signed)
[facility] [address] [place], [place], [address] Phone: [phone]   Fax:  [phone]  Physical Therapy Evaluation   Patient Details  Name: Alexandra Norris MRN: [id number] Date of Birth: [birth year] Referring Provider (PT): Aundra Dubin, [place]   Encounter Date: 02/21/2021   PT End of Session - 02/21/21 1145     Visit Number 1    Number of Visits 20    Date for PT Re-Evaluation 05/02/21    Authorization Type BCBS    Progress Note Due on Visit 10    PT Start Time 1144    PT Stop Time 1217    PT Time Calculation (min) 33 min    Activity Tolerance Patient limited by pain    Behavior During Therapy [facility] for tasks assessed/performed             Past Medical History:  Diagnosis Date   Anemia    Anxiety    Arthritis    Asthma    Carpal tunnel syndrome    Depression    Dyspnea    Endometriosis    History of blood transfusion 1999   w/vaginal delivery   History of bronchitis    "I've stayed in hospital 2 X w/bronchitis"   Migraine    Pneumonia    PONV (postoperative nausea and vomiting)    Pre-diabetes 10/03/2019   A1C 6.4    Past Surgical History:  Procedure Laterality Date   ABDOMINAL HYSTERECTOMY  10/17/2019   APPENDECTOMY     CARPAL TUNNEL RELEASE Right 07/19/2019   Procedure: RIGHT CARPAL TUNNEL RELEASE;  Surgeon: Leandrew Koyanagi, MD;  Location: [place];  Service: Orthopedics;  Laterality: Right;   CARPAL TUNNEL RELEASE Left 09/13/2019   Procedure: LEFT CARPAL TUNNEL RELEASE;  Surgeon: Leandrew Koyanagi, MD;  Location: [place];  Service: Orthopedics;  Laterality: Left;  Bier block   [place]; 2008   CHOLECYSTECTOMY  2011   SHOULDER ARTHROSCOPY WITH DISTAL CLAVICLE RESECTION  02/12/2021   Procedure: SHOULDER ARTHROSCOPY WITH DISTAL CLAVICLE RESECTION;  Surgeon: Leandrew Koyanagi, MD;  Location: [place];  Service: Orthopedics;;   SHOULDER ARTHROSCOPY  WITH ROTATOR CUFF REPAIR AND SUBACROMIAL DECOMPRESSION Left 02/12/2021   Procedure: LEFT SHOULDER ARTHROSCOPY WITH ROTATOR CUFF REPAIR, SUBACROMIAL DECOMPRESSION, EXTENSIVE DEBRIDEMENT;  Surgeon: Leandrew Koyanagi, MD;  Location: [place];  Service: Orthopedics;  Laterality: Left;   TONSILLECTOMY     tonsilletomy     TUBAL LIGATION  04/2007   VAGINAL HYSTERECTOMY Bilateral 10/17/2019   Procedure: HYSTERECTOMY VAGINAL WITH SALPINGECTOMY;  Surgeon: Chancy Milroy, MD;  Location: [place];  Service: Gynecology;  Laterality: Bilateral;    There were no vitals filed for this visit.   Subjective Assessment - 02/21/21 1148     Subjective Pt. stated onset of symptoms more than two years with no specific injury.  Pt. stated trouble with daily activity and hurting, especially at night.  Pt. had surgery on 02/12/2021 and noted pain c any movement and at rest.  Pt. stated nighttime is still painful.  Pt. stated wearing sling all the time.  Pt. stated sleeping in chair and sometimes in bed.    Patient is accompained by: Interpreter    Patient Stated Goals Reduce pain    Currently in Pain? Yes    Pain Score 7    10/10 at worst   Pain Location Shoulder    Pain Orientation Left  Pain Descriptors / Indicators Sore;Throbbing;Aching    Pain Type Chronic pain;Surgical pain    Pain Radiating Towards Lt upper arm and into hand at times.    Pain Onset More than a month ago    Pain Frequency Intermittent    Aggravating Factors  all movement, lying down, sleeping    Pain Relieving Factors sitting is better than lying down, medicine helps a little, ice machine helps                [facility] PT Assessment - 02/21/21 0001       Assessment   Medical Diagnosis Z98.890 (ICD-10-CM) - S/P arthroscopy of left shoulder    Referring Provider (PT) Aundra Dubin, PA-C    Onset Date/Surgical Date 02/12/21    Hand Dominance Right      Precautions   Precautions Shoulder    Precaution Comments Lt  supraspinatus repair, sling for 5 more weeks - passive ROM only during same timeline 5 weeks      Balance Screen   Has the patient fallen in the past 6 months No    Has the patient had a decrease in activity level because of a fear of falling?  No    Is the patient reluctant to leave their home because of a fear of falling?  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)    Leisure No specific reported      Observation/Other Assessments   Observations Localized edema Lt shoulder    Focus on Therapeutic Outcomes (FOTO)  intake 29%, predicted 63%      Posture/Postural Control   Posture/Postural Control Postural limitations    Postural Limitations Rounded Shoulders      ROM / Strength   AROM / PROM / Strength Strength;PROM;AROM      AROM   Overall AROM Comments Rt shoulder AROM WFL at this time, No Lt due to surgical protocol    AROM Assessment Site Shoulder    Right/Left Shoulder Left;Right      PROM   Overall PROM Comments Guarding and pain limited primarily for Lt shoulder    PROM Assessment Site Shoulder    Right/Left Shoulder Left;Right    Left Shoulder Flexion 80 Degrees   in supine, limited by pain   Left Shoulder ABduction 85 Degrees    Left Shoulder Internal Rotation 45 Degrees   in 30 deg abduction   Left Shoulder External Rotation 25 Degrees   in 30 deg abduction, pain at end range     Strength   Strength Assessment Site Shoulder;Elbow    Right/Left Shoulder Left;Right    Right Shoulder Flexion 5/5    Right Shoulder ABduction 5/5    Right Shoulder Internal Rotation 5/5    Right Shoulder External Rotation 5/5    Right/Left Elbow Left;Right    Right Elbow Flexion 5/5    Right Elbow Extension 5/5    Left Elbow Extension 3/5                           [facility] Adult PT Treatment/Exercise - 02/21/21  0001       Self-Care   Self-Care Other Self-Care Comments    Other Self-Care Comments  Protocol adherence, sling use      Exercises  Exercises Other Exercises;Shoulder    Other Exercises  HEP instruction/performance c cues for techniques, handout provided.  Trial set performed of each for comprehension and symptom assessment.      Shoulder Exercises: Seated   Other Seated Exercises Lt arm supported by Rt passive pendulum circles cw, ccw x 10, flexion to 90 degrees x 10 c cues    Other Seated Exercises scapular retraction 5 sec hold x 10      Shoulder Exercises: Standing   Other Standing Exercises pendulum circles x 10 each cw, ccw c cues      Manual Therapy   Manual therapy comments G2 inferior joint mobs, passive mobility flexion, scaption, abduction Lt Gh joint                     PT Education - 02/21/21 1144     Education Details HEP, POC, surgical protocol and sling use    Person(s) Educated Patient    Methods Explanation;Demonstration;Tactile cues;Verbal cues    Comprehension Verbalized understanding;Returned demonstration              PT Short Term Goals - 02/21/21 1145       PT SHORT TERM GOAL #1   Title Patient will demonstrate independent use of home exercise program to maintain progress from in clinic treatments.    Time 3    Period Weeks    Status New    Target Date 03/14/21               PT Long Term Goals - 02/21/21 1225       PT LONG TERM GOAL #1   Title Patient will demonstrate/report pain at worst less than or equal to 2/10 to facilitate minimal limitation in daily activity secondary to pain symptoms.    Time 10    Period Weeks    Status New    Target Date 05/02/21      PT LONG TERM GOAL #2   Title Patient will demonstrate independent use of home exercise program to facilitate ability to maintain/progress functional gains from skilled physical therapy services.    Time 10    Period Weeks    Status New    Target Date  05/02/21      PT LONG TERM GOAL #3   Title Patient will demonstrate return to work/recreational activity at previous level of function without limitations secondary due to condition    Time 10    Period Weeks    Status New    Target Date 05/02/21      PT LONG TERM GOAL #4   Title Pt. will demonstrate FOTO outcome > or = 63% to indicated reduced disability due to condition.    Time 10    Period Weeks    Status New    Target Date 05/02/21      PT LONG TERM GOAL #5   Title Patient will demonstrate Lt [place] joint mobility WFL to facilitate usual self care, dressing, reaching overhead at PLOF s limitation due to symptoms.    Time 10    Period Weeks    Status New    Target Date 05/02/21      Additional Long Term Goals   Additional Long Term Goals Yes      PT LONG TERM GOAL #6   Title Patient will demonstrate Lt UE MMT 4/5 throughout to facilitate usual lifting, carrying in functional activity to PLOF s limitation.    Time 10  Period Weeks    Status New    Target Date 05/02/21                   Plan - 02/21/21 1214     Clinical Impression Statement Patient is a 45 y.o. who comes to clinic with complaints of Lt shoulder pain s/p recent supraspinatus repair surgery with mobility, strength and movement coordination deficits that impair their ability to perform usual daily and recreational functional activities without increase difficulty/symptoms at this time.  Patient to benefit from skilled PT services to address impairments and limitations to improve to previous level of function without restriction secondary to condition.    Personal Factors and Comorbidities Comorbidity 3+    Comorbidities Depression, anxiety, history of Carpal tunnel syndrome    Examination-Activity Limitations Sleep;Sit;Bed Mobility;Bathing;Carry;Caring for Others;Dressing;Hygiene/Grooming;Lift;Reach Overhead    Examination-Participation Restrictions Occupation;Meal Prep;Laundry;Interpersonal  Relationship;Driving;Community Activity;Cleaning    Stability/Clinical Decision Making Stable/Uncomplicated    Clinical Decision Making Low    Rehab Potential Good    PT Frequency 2x / week    PT Duration Other (comment)   10 weeks   PT Treatment/Interventions ADLs/Self Care Home Management;Cryotherapy;Electrical Stimulation;Iontophoresis '4mg'$ /ml Dexamethasone;Moist Heat;Therapeutic exercise;Therapeutic activities;Functional mobility training;DME Instruction;Ultrasound;Neuromuscular re-education;Patient/family education;Passive range of motion;Spinal Manipulations;Joint Manipulations;Dry needling;Taping;Vasopneumatic Device;Manual techniques    PT Next Visit Plan Passive mobility only    PT Home Exercise Plan [id number]    Consulted and Agree with Plan of Care Patient             Patient will benefit from skilled therapeutic intervention in order to improve the following deficits and impairments:  Hypomobility, Increased edema, Decreased activity tolerance, Decreased strength, Impaired UE functional use, Pain, Difficulty walking, Decreased mobility, Decreased range of motion, Impaired perceived functional ability, Improper body mechanics, Postural dysfunction, Impaired flexibility, Decreased coordination  Visit Diagnosis: Chronic left shoulder pain  Muscle weakness (generalized)  Abnormal posture  Localized edema     Problem List Patient Active Problem List   Diagnosis Date Noted   Nontraumatic tear of left supraspinatus tendon 02/12/2021   Impingement syndrome of left shoulder    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   Shingles 11/14/2020   Neck pain 10/14/2020   Encounter to establish care 09/09/2020   Fatty liver disease, nonalcoholic [id number]   Elevated blood-pressure reading, without diagnosis of hypertension 06/17/2020   Influenza vaccine needed 04/03/2020   Cervical disc disorder at C5-C6 level with radiculopathy 04/03/2020   S/P carpal tunnel release 08/30/2019    Right carpal tunnel syndrome 07/11/2019   Left carpal tunnel syndrome 07/11/2019   Arthritis    Anemia    Seasonal allergies 10/08/2016   Nasal obstruction 02/18/2016   DM2 (diabetes mellitus, type 2) ([place]) 10/21/2015   Bilateral chronic knee pain 09/25/2015   GASTROESOPHAGEAL REFLUX DISEASE 08/14/2009   DEPRESSION, SITUATIONAL, PROLONGED 02/05/2009   Body mass index (BMI) 45.0-49.9, adult ([facility]) 05/30/2007   Moderate persistent asthma with acute exacerbation 12/06/2006    Scot Jun, PT, DPT, OCS, ATC 02/21/21  12:28 PM    [facility] [address] [place], [place], [address] Phone: [phone]   Fax:  [phone]  Name: Teckla Pheng MRN: [id number] Date of Birth: [birth year]

## 2021-02-23 ENCOUNTER — Other Ambulatory Visit: Payer: Self-pay

## 2021-02-23 ENCOUNTER — Ambulatory Visit
Admission: RE | Admit: 2021-02-23 | Discharge: 2021-02-23 | Disposition: A | Discharge status: Home / Self Care | Payer: BC Managed Care – PPO | Source: Ambulatory Visit | Attending: Orthopaedic Surgery | Admitting: Orthopaedic Surgery

## 2021-02-23 ENCOUNTER — Other Ambulatory Visit: Payer: BC Managed Care – PPO

## 2021-02-23 DIAGNOSIS — M25562 Pain in left knee: Secondary | ICD-10-CM

## 2021-02-23 DIAGNOSIS — G8929 Other chronic pain: Secondary | ICD-10-CM

## 2021-02-23 IMAGING — MR MR KNEE*L* W/O CM
4 of 6 series · 23 of 40 positions shown · non-contrast
Comparison: X-ray [DATE]

CLINICAL DATA: Left knee pain and swelling for 2 years

EXAM:
MRI OF THE LEFT KNEE WITHOUT CONTRAST
TECHNIQUE: Multiplanar, multisequence MR imaging of the knee was performed. No
intravenous contrast was administered.

[Series 3: T2 fat-sat · axial · 4.0mm · 0.50mm/px · z∈[-117,+12]mm · 7 of 27 slices shown (1 of 2)]
[im 1/27]
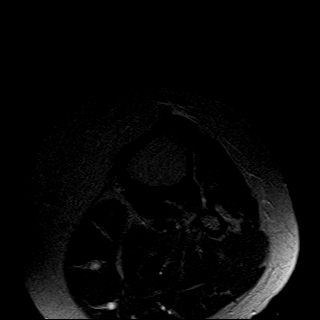
[im 5/27]
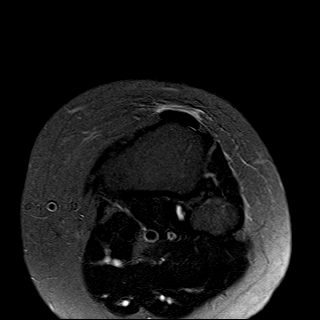
[im 9/27]
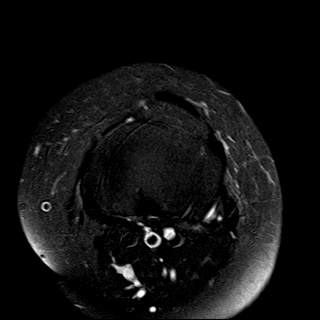
[im 14/27]
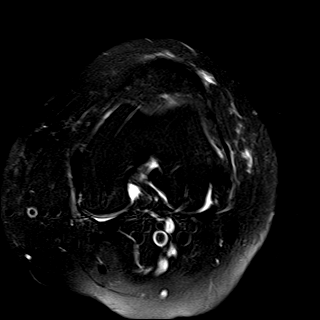
[im 18/27]
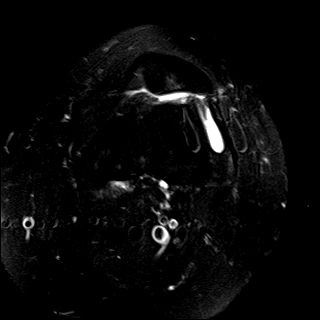
[im 22/27]
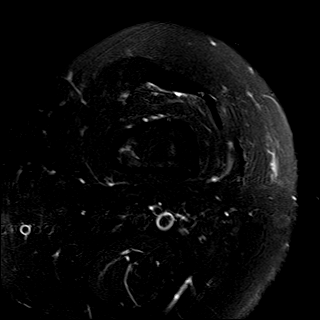
[im 27/27]
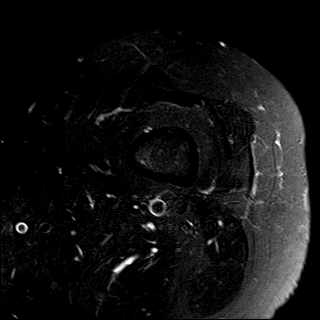

[Series 5: T2 fat-sat · coronal · 4.0mm · 0.31mm/px · 3 of 29 slices shown (2 of 2)]
[im 5/29]
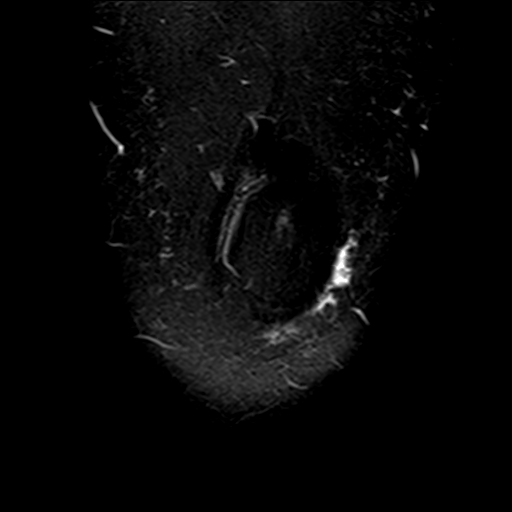
[im 15/29]
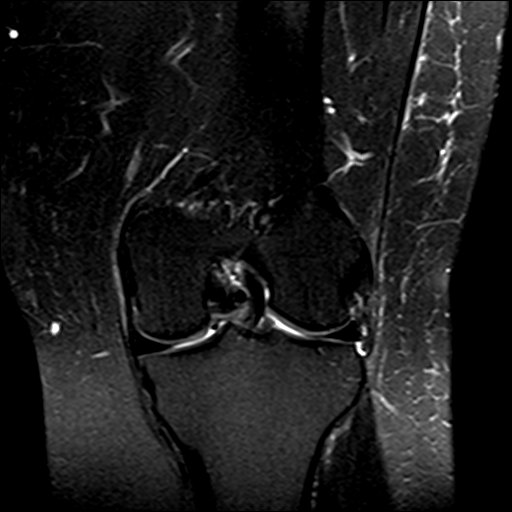
[im 24/29]
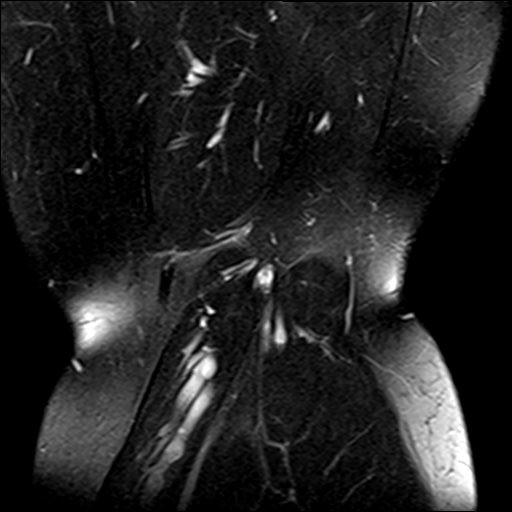

[Series 7: PD fat-sat · sagittal · 3.0mm · 0.29mm/px · 7 of 27 slices shown (1 of 2)]
[im 1/27]
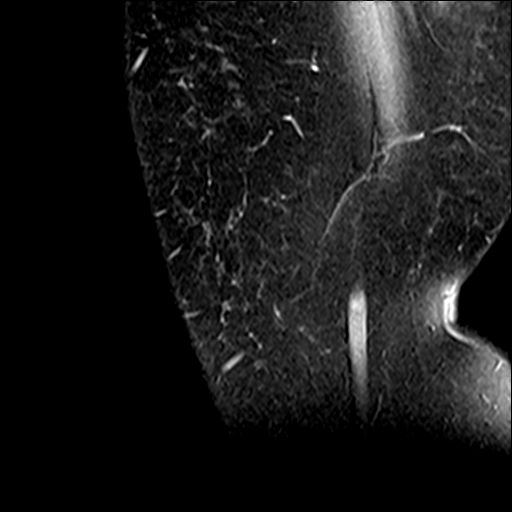
[im 5/27]
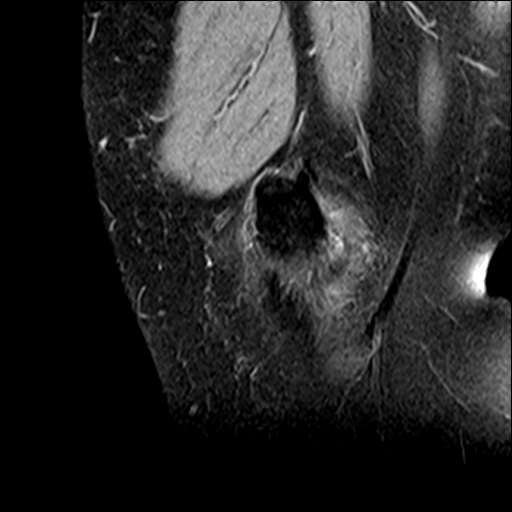
[im 9/27]
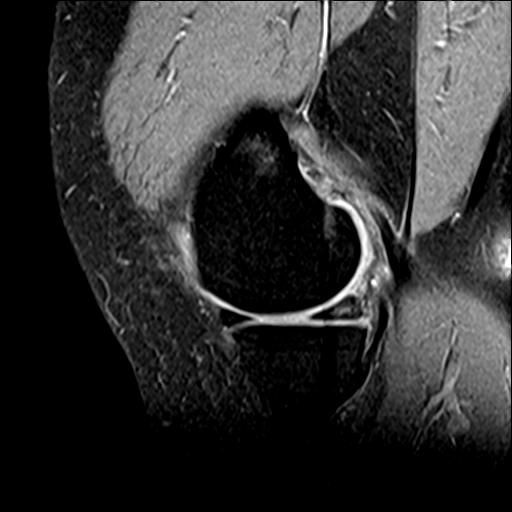
[im 14/27]
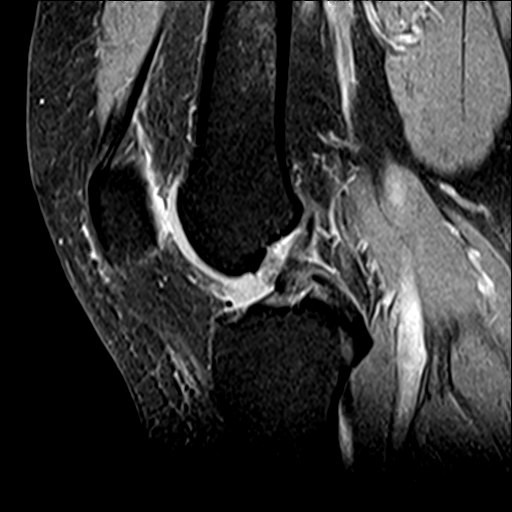
[im 18/27]
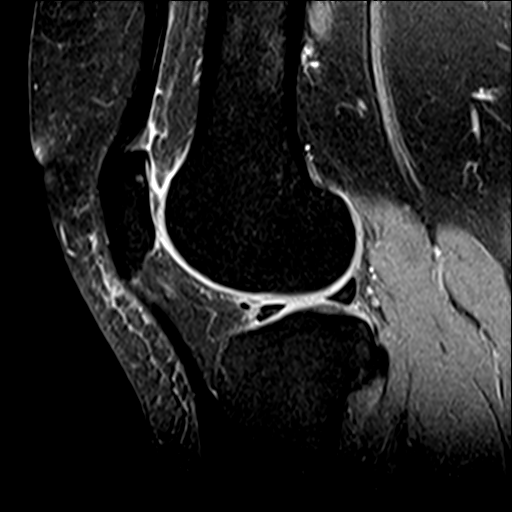
[im 22/27]
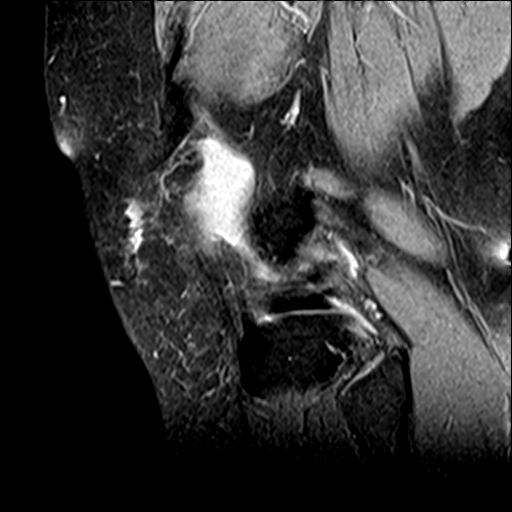
[im 27/27]
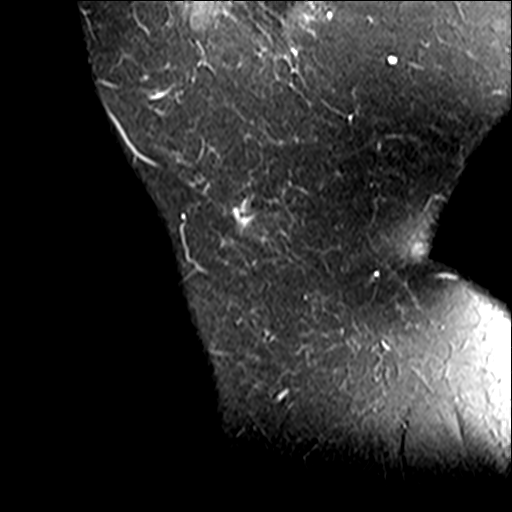

[Series 8: PD fat-sat · coronal · 3.0mm · 0.29mm/px · 6 of 25 slices shown (2 of 2)]
[im 1/25]
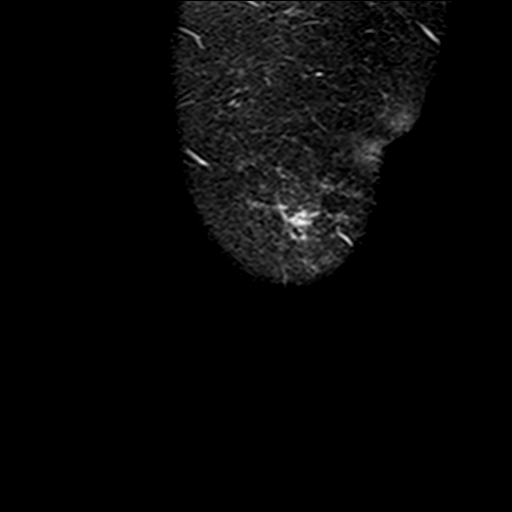
[im 5/25]
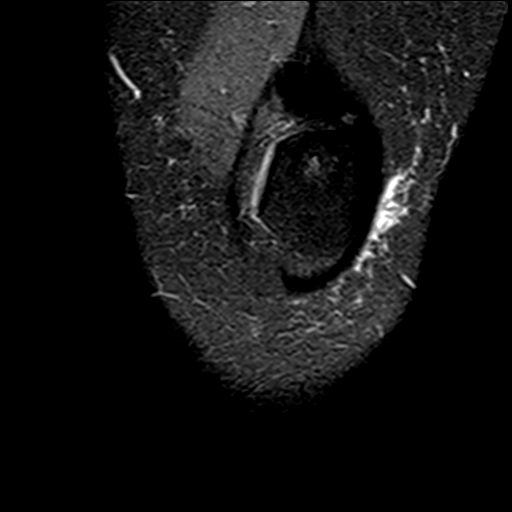
[im 10/25]
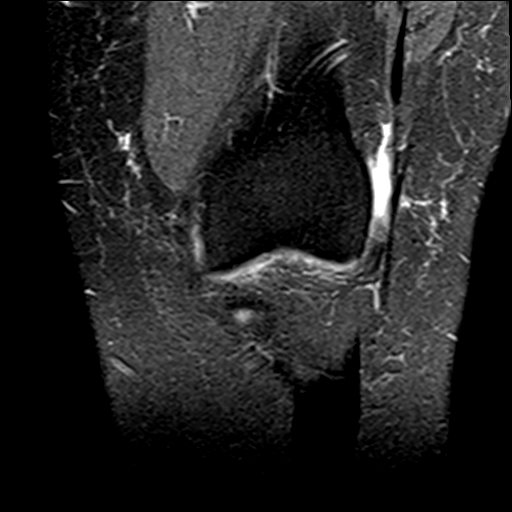
[im 15/25]
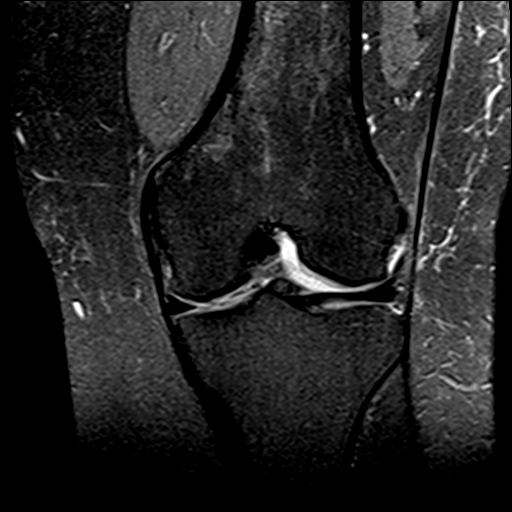
[im 20/25]
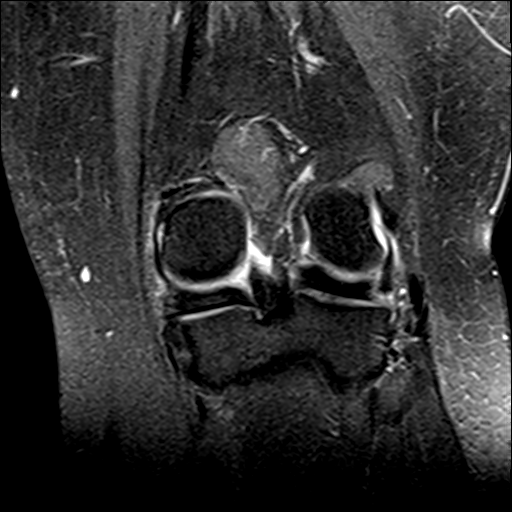
[im 25/25]
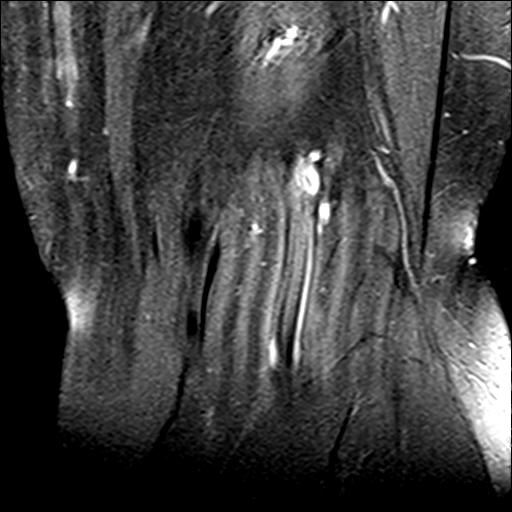

[23 of 40 positions shown; findings below may reference images not displayed]

FINDINGS: MENISCI

Medial meniscus:  Intact.

Lateral meniscus:  Intact.

LIGAMENTS

Cruciates:  Intact ACL and PCL.

Collaterals: Intact MCL with minimal periligamentous edema. Lateral
collateral ligament complex intact.

CARTILAGE

Patellofemoral: Moderate chondral thinning and surface irregularity
of the lateral patellar facet. No trochlear cartilage defect
identified.

Medial:  No chondral defect.

Lateral:  No chondral defect.

Joint:  Trace joint effusion.  Unremarkable fat pads.

Popliteal Fossa:  No Baker cyst. Intact popliteus tendon.

Extensor Mechanism:  Intact quadriceps tendon and patellar tendon.

Bones: No focal marrow signal abnormality. No fracture or
dislocation.

Other: Mild prepatellar subcutaneous edema.
IMPRESSION: 1. Grade 1 MCL sprain.
2. Moderate chondral thinning and surface irregularity of the
lateral patellar facet.
3. Trace joint effusion.
4. Intact menisci.

## 2021-02-25 ENCOUNTER — Encounter: Payer: Self-pay | Admitting: Orthopaedic Surgery

## 2021-02-25 ENCOUNTER — Other Ambulatory Visit: Payer: Self-pay

## 2021-02-25 ENCOUNTER — Ambulatory Visit (INDEPENDENT_AMBULATORY_CARE_PROVIDER_SITE_OTHER): Payer: BC Managed Care – PPO | Admitting: Orthopaedic Surgery

## 2021-02-25 DIAGNOSIS — G8929 Other chronic pain: Secondary | ICD-10-CM

## 2021-02-25 DIAGNOSIS — M25562 Pain in left knee: Secondary | ICD-10-CM

## 2021-02-25 NOTE — Progress Notes (Signed)
Office Visit Note   Patient: Alexandra Norris           Date of Birth: [birth year]           MRN: [id number] Visit Date: 02/25/2021              Requested by: Orma Render, NP [place],  [place] [address] PCP: Orma Render, NP   Assessment & Plan: Visit Diagnoses:  1. 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  No other significant findings.  Based on these results I have recommended aggressive weight loss to help lessen the pain.  I explained that this does not need surgery fortunately.  Strengthening will also improve pain.  We talked about medications and injections periodically when needed.  We will see her back as scheduled for the shoulder.  Follow-Up Instructions: No follow-ups on file.   Orders:  No orders of the defined types were placed in this encounter.  No orders of the defined types were placed in this encounter.     Procedures: No procedures performed   Clinical Data: No additional findings.   Subjective: Chief Complaint  Patient presents with   Left Knee - Pain    HPI  Patient returns today for MRI review.  Translator present today.  Feels occasional left knee pain with activity.  Review of Systems   Objective: Vital Signs: LMP 09/20/2019 (Exact Date)   Physical Exam  Ortho Exam  Left knee exam is unchanged.  Specialty Comments:  No specialty comments available.  Imaging: No results found.   PMFS History: Patient Active Problem List   Diagnosis Date Noted   Nontraumatic tear of left supraspinatus tendon 02/12/2021   Impingement syndrome of left shoulder    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   Shingles 11/14/2020   Neck pain 10/14/2020   Encounter to establish care 09/09/2020   Fatty liver disease, nonalcoholic 04/04/2535   Elevated blood-pressure reading, without diagnosis of hypertension 06/17/2020   Influenza vaccine needed  04/03/2020   Cervical disc disorder at C5-C6 level with radiculopathy 04/03/2020   S/P carpal tunnel release 08/30/2019   Right carpal tunnel syndrome 07/11/2019   Left carpal tunnel syndrome 07/11/2019   Arthritis    Anemia    Seasonal allergies 10/08/2016   Nasal obstruction 02/18/2016   DM2 (diabetes mellitus, type 2) ([place]) 10/21/2015   Bilateral chronic knee pain 09/25/2015   GASTROESOPHAGEAL REFLUX DISEASE 08/14/2009   DEPRESSION, SITUATIONAL, PROLONGED 02/05/2009   Body mass index (BMI) 45.0-49.9, adult ([place]) 05/30/2007   Moderate persistent asthma with acute exacerbation 12/06/2006   Past Medical History:  Diagnosis Date   Anemia    Anxiety    Arthritis    Asthma    Carpal tunnel syndrome    Depression    Dyspnea    Endometriosis    History of blood transfusion 1999   w/vaginal delivery   History of bronchitis    "I've stayed in hospital 2 X w/bronchitis"   Migraine    Pneumonia    PONV (postoperative nausea and vomiting)    Pre-diabetes 10/03/2019   A1C 6.4    Family History  Problem Relation Age of Onset   Diabetes Mother    Osteoarthritis Mother    Hypertension Mother    Hyperlipidemia Mother    Arthritis Mother     Past Surgical History:  Procedure Laterality Date   ABDOMINAL HYSTERECTOMY  10/17/2019  APPENDECTOMY     CARPAL TUNNEL RELEASE Right 07/19/2019   Procedure: RIGHT CARPAL TUNNEL RELEASE;  Surgeon: Leandrew Koyanagi, MD;  Location: [place];  Service: Orthopedics;  Laterality: Right;   CARPAL TUNNEL RELEASE Left 09/13/2019   Procedure: LEFT CARPAL TUNNEL RELEASE;  Surgeon: Leandrew Koyanagi, MD;  Location: [place];  Service: Orthopedics;  Laterality: Left;  Bier block   [place]; 2008   CHOLECYSTECTOMY  2011   SHOULDER ARTHROSCOPY WITH DISTAL CLAVICLE RESECTION  02/12/2021   Procedure: SHOULDER ARTHROSCOPY WITH DISTAL CLAVICLE RESECTION;  Surgeon: Leandrew Koyanagi, MD;  Location: [place];   Service: Orthopedics;;   SHOULDER ARTHROSCOPY WITH ROTATOR CUFF REPAIR AND SUBACROMIAL DECOMPRESSION Left 02/12/2021   Procedure: LEFT SHOULDER ARTHROSCOPY WITH ROTATOR CUFF REPAIR, SUBACROMIAL DECOMPRESSION, EXTENSIVE DEBRIDEMENT;  Surgeon: Leandrew Koyanagi, MD;  Location: [place];  Service: Orthopedics;  Laterality: Left;   TONSILLECTOMY     tonsilletomy     TUBAL LIGATION  04/2007   VAGINAL HYSTERECTOMY Bilateral 10/17/2019   Procedure: HYSTERECTOMY VAGINAL WITH SALPINGECTOMY;  Surgeon: Chancy Milroy, MD;  Location: [place];  Service: Gynecology;  Laterality: Bilateral;   Social History   Occupational History   Not on file  Tobacco Use   Smoking status: Never   Smokeless tobacco: Never  Vaping Use   Vaping Use: Never used  Substance and Sexual Activity   Alcohol use: No   Drug use: No   Sexual activity: Yes    Birth control/protection: Surgical

## 2021-03-07 ENCOUNTER — Telehealth: Payer: Self-pay | Admitting: Orthopaedic Surgery

## 2021-03-07 ENCOUNTER — Ambulatory Visit (INDEPENDENT_AMBULATORY_CARE_PROVIDER_SITE_OTHER): Payer: BC Managed Care – PPO | Admitting: Rehabilitative and Restorative Service Providers"

## 2021-03-07 ENCOUNTER — Encounter: Payer: Self-pay | Admitting: Rehabilitative and Restorative Service Providers"

## 2021-03-07 ENCOUNTER — Other Ambulatory Visit: Payer: Self-pay

## 2021-03-07 ENCOUNTER — Other Ambulatory Visit: Payer: Self-pay | Admitting: Physician Assistant

## 2021-03-07 ENCOUNTER — Other Ambulatory Visit (HOSPITAL_COMMUNITY): Payer: Self-pay

## 2021-03-07 DIAGNOSIS — R600 Localized edema: Secondary | ICD-10-CM

## 2021-03-07 DIAGNOSIS — M25512 Pain in left shoulder: Secondary | ICD-10-CM

## 2021-03-07 DIAGNOSIS — R293 Abnormal posture: Secondary | ICD-10-CM | POA: Diagnosis not present

## 2021-03-07 DIAGNOSIS — M6281 Muscle weakness (generalized): Secondary | ICD-10-CM

## 2021-03-07 DIAGNOSIS — G8929 Other chronic pain: Secondary | ICD-10-CM

## 2021-03-07 MED ORDER — IBUPROFEN 800 MG PO TABS
800.0000 mg | ORAL_TABLET | Freq: Three times a day (TID) | ORAL | 0 refills | Status: DC | PRN
  Filled 2021-03-07: qty 30, 10d supply, fill #0

## 2021-03-07 MED ORDER — HYDROCODONE-ACETAMINOPHEN 5-325 MG PO TABS
1.0000 | ORAL_TABLET | Freq: Three times a day (TID) | ORAL | 0 refills | Status: DC | PRN
  Filled 2021-03-07: qty 30, 5d supply, fill #0

## 2021-03-07 NOTE — Patient Instructions (Signed)
Access Code: [email or web address] URL: [email or web address] Date: 03/07/2021 Prepared by: Vista Mink  Exercises Seated Scapular Retraction - 2 x daily - 7 x weekly - 1 sets - 10-15 reps - 5 hold Seated Elbow Flexion and Extension AROM (Mirrored) - 2 x daily - 7 x weekly - 1 sets - 10-15 reps Circular Shoulder Pendulum with Table Support - 1 x daily - 7 x weekly - 3 sets - 10 reps Standing Circular Shoulder Pendulum Supported with Arm Bent (Mirrored) - 2 x daily - 7 x weekly - 3 sets - 10 reps Standing Flexion Extension Shoulder Pendulum Supported with Arm Bent (Mirrored) - 2 x daily - 7 x weekly - 3 sets - 10 reps

## 2021-03-07 NOTE — Telephone Encounter (Signed)
Rx refill Hydrocodone  & Ibuprofen  [facility]

## 2021-03-07 NOTE — Therapy (Signed)
[facility] [address] [place], [place], [address] Phone: [phone]   Fax:  [phone]  Physical Therapy Treatment  Patient Details  Name: Alexandra Norris MRN: [id number] Date of Birth: [birth year] Referring Provider (PT): Aundra Dubin, [place]   Encounter Date: 03/07/2021   PT End of Session - 03/07/21 1032     Visit Number 2    Number of Visits 20    Date for PT Re-Evaluation 05/02/21    Authorization Type BCBS    Progress Note Due on Visit 10    PT Start Time 0847    PT Stop Time 0928    PT Time Calculation (min) 41 min    Activity Tolerance Patient tolerated treatment well    Behavior During Therapy [facility] for tasks assessed/performed             Past Medical History:  Diagnosis Date   Anemia    Anxiety    Arthritis    Asthma    Carpal tunnel syndrome    Depression    Dyspnea    Endometriosis    History of blood transfusion 1999   w/vaginal delivery   History of bronchitis    "I've stayed in hospital 2 X w/bronchitis"   Migraine    Pneumonia    PONV (postoperative nausea and vomiting)    Pre-diabetes 10/03/2019   A1C 6.4    Past Surgical History:  Procedure Laterality Date   ABDOMINAL HYSTERECTOMY  10/17/2019   APPENDECTOMY     CARPAL TUNNEL RELEASE Right 07/19/2019   Procedure: RIGHT CARPAL TUNNEL RELEASE;  Surgeon: Leandrew Koyanagi, MD;  Location: [place];  Service: Orthopedics;  Laterality: Right;   CARPAL TUNNEL RELEASE Left 09/13/2019   Procedure: LEFT CARPAL TUNNEL RELEASE;  Surgeon: Leandrew Koyanagi, MD;  Location: [place];  Service: Orthopedics;  Laterality: Left;  Bier block   [place]; 2008   CHOLECYSTECTOMY  2011   SHOULDER ARTHROSCOPY WITH DISTAL CLAVICLE RESECTION  02/12/2021   Procedure: SHOULDER ARTHROSCOPY WITH DISTAL CLAVICLE RESECTION;  Surgeon: Leandrew Koyanagi, MD;  Location: [place];  Service: Orthopedics;;   SHOULDER  ARTHROSCOPY WITH ROTATOR CUFF REPAIR AND SUBACROMIAL DECOMPRESSION Left 02/12/2021   Procedure: LEFT SHOULDER ARTHROSCOPY WITH ROTATOR CUFF REPAIR, SUBACROMIAL DECOMPRESSION, EXTENSIVE DEBRIDEMENT;  Surgeon: Leandrew Koyanagi, MD;  Location: [place];  Service: Orthopedics;  Laterality: Left;   TONSILLECTOMY     tonsilletomy     TUBAL LIGATION  04/2007   VAGINAL HYSTERECTOMY Bilateral 10/17/2019   Procedure: HYSTERECTOMY VAGINAL WITH SALPINGECTOMY;  Surgeon: Chancy Milroy, MD;  Location: [place];  Service: Gynecology;  Laterality: Bilateral;    There were no vitals filed for this visit.   Subjective Assessment - 03/07/21 0928     Subjective Alexandra Norris reports excellent HEP compliance.  She is sleeping about 4 hours uninterrupted and has not used ice in 3 days.    Patient is accompained by: Interpreter    Patient Stated Goals Reduce pain    Currently in Pain? Yes    Pain Score 7     Pain Location Shoulder    Pain Orientation Left    Pain Descriptors / Indicators Sore;Throbbing;Aching    Pain Type Chronic pain;Surgical pain    Pain Radiating Towards Lateral arm to elbow    Pain Onset More than a month ago    Pain Frequency Constant    Aggravating Factors  All L UE  use    Pain Relieving Factors Exercises, pain meds PRN (before bed)    Effect of Pain on Daily Activities Unable to use L UE with most ADLs    Multiple Pain Sites No                [facility] PT Assessment - 03/07/21 0001       ROM / Strength   AROM / PROM / Strength PROM      AROM   AROM Assessment Site Shoulder    Right/Left Shoulder Left      PROM   PROM Assessment Site Shoulder    Right/Left Shoulder Left    Left Shoulder Flexion 140 Degrees    Left Shoulder Internal Rotation 70 Degrees    Left Shoulder External Rotation 70 Degrees                           [facility] Adult PT Treatment/Exercise - 03/07/21 0001       Posture/Postural Control   Posture/Postural Control Postural  limitations    Postural Limitations Rounded Shoulders      Self-Care   Self-Care Other Self-Care Comments    Other Self-Care Comments  Talked about expectations at different points post-surgery, ice use and reviewed HEP      Exercises   Exercises Shoulder      Shoulder Exercises: Seated   Retraction Strengthening;Both;10 reps;Limitations    Retraction Limitations 5 seconds (shoulder blade pinches)    Other Seated Exercises L arm supporting R for codmans CW/CCW 20X each    Other Seated Exercises Elbow flexion with grip 10X 3 seconds      Shoulder Exercises: Standing   Other Standing Exercises Pendulum F/B; CW; CCW 20X each      Manual Therapy   Manual Therapy Passive ROM    Manual therapy comments IR/ER/flexion                     PT Education - 03/07/21 1031     Education Details Discussed expectations at different points post surgery.  Encouraged ice 15-20 minutes to the L shoulder before bed to help sleep (currently about 4 hours uninterrupted).  Reviewed current HEP.    Person(s) Educated Patient    Methods Explanation;Demonstration;Tactile cues;Verbal cues    Comprehension Verbalized understanding;Tactile cues required;Need further instruction;Returned demonstration;Verbal cues required              PT Short Term Goals - 03/07/21 1032       PT SHORT TERM GOAL #1   Title Patient will demonstrate independent use of home exercise program to maintain progress from in clinic treatments.    Time 3    Period Weeks    Status Achieved    Target Date 03/14/21               PT Long Term Goals - 02/21/21 1225       PT LONG TERM GOAL #1   Title Patient will demonstrate/report pain at worst less than or equal to 2/10 to facilitate minimal limitation in daily activity secondary to pain symptoms.    Time 10    Period Weeks    Status New    Target Date 05/02/21      PT LONG TERM GOAL #2   Title Patient will demonstrate independent use of home exercise  program to facilitate ability to maintain/progress functional gains from skilled physical therapy services.    Time 10  Period Weeks    Status New    Target Date 05/02/21      PT LONG TERM GOAL #3   Title Patient will demonstrate return to work/recreational activity at previous level of function without limitations secondary due to condition    Time 10    Period Weeks    Status New    Target Date 05/02/21      PT LONG TERM GOAL #4   Title Pt. will demonstrate FOTO outcome > or = 63% to indicated reduced disability due to condition.    Time 10    Period Weeks    Status New    Target Date 05/02/21      PT LONG TERM GOAL #5   Title Patient will demonstrate Lt [place] joint mobility WFL to facilitate usual self care, dressing, reaching overhead at PLOF s limitation due to symptoms.    Time 10    Period Weeks    Status New    Target Date 05/02/21      Additional Long Term Goals   Additional Long Term Goals Yes      PT LONG TERM GOAL #6   Title Patient will demonstrate Lt UE MMT 4/5 throughout to facilitate usual lifting, carrying in functional activity to PLOF s limitation.    Time 10    Period Weeks    Status New    Target Date 05/02/21                   Plan - 03/07/21 1033     Clinical Impression Statement Alexandra Norris reports and demonstrates excellent early HEP compliance.  PROM was much better today than at evaluation 2 weeks ago.  We reviewed her HEP and made recommendations to try icing her surgical shoulder for 15-20 minutes before bedtime to help sleep.  Continue current POC.    Personal Factors and Comorbidities Comorbidity 3+    Comorbidities Depression, anxiety, history of Carpal tunnel syndrome    Examination-Activity Limitations Sleep;Sit;Bed Mobility;Bathing;Carry;Caring for Others;Dressing;Hygiene/Grooming;Lift;Reach Overhead    Examination-Participation Restrictions Occupation;Meal Prep;Laundry;Interpersonal Relationship;Driving;Community Activity;Cleaning     Stability/Clinical Decision Making Stable/Uncomplicated    Rehab Potential Good    PT Frequency 2x / week    PT Duration Other (comment)   10 weeks   PT Treatment/Interventions ADLs/Self Care Home Management;Cryotherapy;Electrical Stimulation;Iontophoresis 4mg /ml Dexamethasone;Moist Heat;Therapeutic exercise;Therapeutic activities;Functional mobility training;DME Instruction;Ultrasound;Neuromuscular re-education;Patient/family education;Passive range of motion;Spinal Manipulations;Joint Manipulations;Dry needling;Taping;Vasopneumatic Device;Manual techniques    PT Next Visit Plan Passive mobility only    PT Home Exercise Plan [email or web address]    Consulted and Agree with Plan of Care Patient             Patient will benefit from skilled therapeutic intervention in order to improve the following deficits and impairments:  Hypomobility, Increased edema, Decreased activity tolerance, Decreased strength, Impaired UE functional use, Pain, Difficulty walking, Decreased mobility, Decreased range of motion, Impaired perceived functional ability, Improper body mechanics, Postural dysfunction, Impaired flexibility, Decreased coordination  Visit Diagnosis: Chronic left shoulder pain  Muscle weakness (generalized)  Abnormal posture  Localized edema     Problem List Patient Active Problem List   Diagnosis Date Noted   Nontraumatic tear of left supraspinatus tendon 02/12/2021   Impingement syndrome of left shoulder    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   Shingles 11/14/2020   Neck pain 10/14/2020   Encounter to establish care 09/09/2020   Fatty liver disease, nonalcoholic 92/42/6834   Elevated blood-pressure reading, without diagnosis of hypertension 06/17/2020   Influenza vaccine needed  04/03/2020   Cervical disc disorder at C5-C6 level with radiculopathy 04/03/2020   S/P carpal tunnel release 08/30/2019   Right carpal tunnel syndrome 07/11/2019   Left carpal tunnel syndrome 07/11/2019    Arthritis    Anemia    Seasonal allergies 10/08/2016   Nasal obstruction 02/18/2016   DM2 (diabetes mellitus, type 2) ([place]) 10/21/2015   Bilateral chronic knee pain 09/25/2015   GASTROESOPHAGEAL REFLUX DISEASE 08/14/2009   DEPRESSION, SITUATIONAL, PROLONGED 02/05/2009   Body mass index (BMI) 45.0-49.9, adult ([place]) 05/30/2007   Moderate persistent asthma with acute exacerbation 12/06/2006    Farley Ly, PT, MPT 03/07/2021, 10:37 AM  [facility] [address] [place], [place], [address] Phone: [phone]   Fax:  [phone]  Name: Alexandra Norris MRN: [id number] Date of Birth: Sep 28, 1975

## 2021-03-07 NOTE — Telephone Encounter (Signed)
Patient aware.

## 2021-03-07 NOTE — Telephone Encounter (Signed)
sent 

## 2021-03-10 ENCOUNTER — Ambulatory Visit (INDEPENDENT_AMBULATORY_CARE_PROVIDER_SITE_OTHER): Payer: BC Managed Care – PPO | Admitting: Physical Therapy

## 2021-03-10 ENCOUNTER — Other Ambulatory Visit: Payer: Self-pay

## 2021-03-10 DIAGNOSIS — R600 Localized edema: Secondary | ICD-10-CM

## 2021-03-10 DIAGNOSIS — R293 Abnormal posture: Secondary | ICD-10-CM

## 2021-03-10 DIAGNOSIS — M6281 Muscle weakness (generalized): Secondary | ICD-10-CM | POA: Diagnosis not present

## 2021-03-10 DIAGNOSIS — M25512 Pain in left shoulder: Secondary | ICD-10-CM | POA: Diagnosis not present

## 2021-03-10 DIAGNOSIS — G8929 Other chronic pain: Secondary | ICD-10-CM

## 2021-03-10 NOTE — Therapy (Signed)
[facility] [address] [place], [place], [address] Phone: [phone]   Fax:  [phone]  Physical Therapy Treatment  Patient Details  Name: Alexandra Norris MRN: [id number] Date of Birth: Jul 31, 1975 Referring Provider (PT): Dwana Melena PA-C   Encounter Date: 03/10/2021   PT End of Session - 03/10/21 0856     Visit Number 3    Number of Visits 20    Date for PT Re-Evaluation 05/02/21    Authorization Type BCBS    Progress Note Due on Visit 10    PT Start Time 0847    PT Stop Time 0925    PT Time Calculation (min) 38 min    Activity Tolerance Patient tolerated treatment well    Behavior During Therapy [facility] for tasks assessed/performed             Past Medical History:  Diagnosis Date   Anemia    Anxiety    Arthritis    Asthma    Carpal tunnel syndrome    Depression    Dyspnea    Endometriosis    History of blood transfusion 1999   w/vaginal delivery   History of bronchitis    "I've stayed in hospital 2 X w/bronchitis"   Migraine    Pneumonia    PONV (postoperative nausea and vomiting)    Pre-diabetes 10/03/2019   A1C 6.4    Past Surgical History:  Procedure Laterality Date   ABDOMINAL HYSTERECTOMY  10/17/2019   APPENDECTOMY     CARPAL TUNNEL RELEASE Right 07/19/2019   Procedure: RIGHT CARPAL TUNNEL RELEASE;  Surgeon: Leandrew Koyanagi, MD;  Location: [place];  Service: Orthopedics;  Laterality: Right;   CARPAL TUNNEL RELEASE Left 09/13/2019   Procedure: LEFT CARPAL TUNNEL RELEASE;  Surgeon: Leandrew Koyanagi, MD;  Location: [place];  Service: Orthopedics;  Laterality: Left;  Bier block   [place]; 2008   CHOLECYSTECTOMY  2011   SHOULDER ARTHROSCOPY WITH DISTAL CLAVICLE RESECTION  02/12/2021   Procedure: SHOULDER ARTHROSCOPY WITH DISTAL CLAVICLE RESECTION;  Surgeon: Leandrew Koyanagi, MD;  Location: [place];  Service: Orthopedics;;   SHOULDER  ARTHROSCOPY WITH ROTATOR CUFF REPAIR AND SUBACROMIAL DECOMPRESSION Left 02/12/2021   Procedure: LEFT SHOULDER ARTHROSCOPY WITH ROTATOR CUFF REPAIR, SUBACROMIAL DECOMPRESSION, EXTENSIVE DEBRIDEMENT;  Surgeon: Leandrew Koyanagi, MD;  Location: [place];  Service: Orthopedics;  Laterality: Left;   TONSILLECTOMY     tonsilletomy     TUBAL LIGATION  04/2007   VAGINAL HYSTERECTOMY Bilateral 10/17/2019   Procedure: HYSTERECTOMY VAGINAL WITH SALPINGECTOMY;  Surgeon: Chancy Milroy, MD;  Location: [place];  Service: Gynecology;  Laterality: Bilateral;    There were no vitals filed for this visit.   Subjective Assessment - 03/10/21 0927     Subjective Pt arriving today reporitng 4/10 pain in left shoulder. Pt still reporting sleeping is dificult and she wakes up with 8/10 pain. Pt stating she has been doing her HEP and icing as needed. Pt also reporting taking pain meds prior to therapy.    Patient is accompained by: Interpreter    Patient Stated Goals Reduce pain    Currently in Pain? Yes    Pain Score 4     Pain Location Shoulder    Pain Orientation Left    Pain Descriptors / Indicators Sore;Aching    Pain Type Surgical pain    Pain Onset More than a month ago  [facility] PT Assessment - 03/10/21 0001       Assessment   Medical Diagnosis [id number]    Referring Provider (PT) Dwana Melena PA-C    Onset Date/Surgical Date 02/12/21      PROM   Left Shoulder Flexion 135 Degrees    Left Shoulder ABduction 105 Degrees    Left Shoulder Internal Rotation 70 Degrees   in 45 degrees abduction   Left Shoulder External Rotation 45 Degrees   in 45 degrees abduction                          [facility] Adult PT Treatment/Exercise - 03/10/21 0001       Exercises   Exercises Shoulder      Shoulder Exercises: Seated   Other Seated Exercises left UE pendulems 1 minute each direction forward/back, side/side, clockwise and counter clock wise      Shoulder  Exercises: Standing   Other Standing Exercises Pendulum F/B; CW; CCW 1 minute each      Manual Therapy   Manual Therapy Passive ROM;Joint mobilization    Manual therapy comments Grade 2 inferior joint mobs, PROM flexion/ abduction/ scaption                       PT Short Term Goals - 03/10/21 4401       PT SHORT TERM GOAL #1   Title Patient will demonstrate independent use of home exercise program to maintain progress from in clinic treatments.    Status Achieved               PT Long Term Goals - 02/21/21 1225       PT LONG TERM GOAL #1   Title Patient will demonstrate/report pain at worst less than or equal to 2/10 to facilitate minimal limitation in daily activity secondary to pain symptoms.    Time 10    Period Weeks    Status New    Target Date 05/02/21      PT LONG TERM GOAL #2   Title Patient will demonstrate independent use of home exercise program to facilitate ability to maintain/progress functional gains from skilled physical therapy services.    Time 10    Period Weeks    Status New    Target Date 05/02/21      PT LONG TERM GOAL #3   Title Patient will demonstrate return to work/recreational activity at previous level of function without limitations secondary due to condition    Time 10    Period Weeks    Status New    Target Date 05/02/21      PT LONG TERM GOAL #4   Title Pt. will demonstrate FOTO outcome > or = 63% to indicated reduced disability due to condition.    Time 10    Period Weeks    Status New    Target Date 05/02/21      PT LONG TERM GOAL #5   Title Patient will demonstrate Lt [place] joint mobility WFL to facilitate usual self care, dressing, reaching overhead at PLOF s limitation due to symptoms.    Time 10    Period Weeks    Status New    Target Date 05/02/21      Additional Long Term Goals   Additional Long Term Goals Yes      PT LONG TERM GOAL #6   Title Patient will demonstrate Lt UE MMT 4/5 throughout to  facilitate usual lifting, carrying in functional activity to PLOF s limitation.    Time 10    Period Weeks    Status New    Target Date 05/02/21                   Plan - 03/10/21 0856     Clinical Impression Statement Pt tolerating gentle mobs and manual therapy well today. Pt stating her pain is still worse at night when trying to sleep and when first waking up. Pt stating pain can increase to 8/10 at times. Pt reporting taking pain meds prior to therapy and reporting 4/10 pain upon arrival at rest. Pt with tenderness over  anterior shoulder. Pt reporting compliance in her HEP. Continue skilled PT as pt tolerates to maximize function.    Personal Factors and Comorbidities Comorbidity 3+    Comorbidities Depression, anxiety, history of Carpal tunnel syndrome    Examination-Activity Limitations Sleep;Sit;Bed Mobility;Bathing;Carry;Caring for Others;Dressing;Hygiene/Grooming;Lift;Reach Overhead    Examination-Participation Restrictions Occupation;Meal Prep;Laundry;Interpersonal Relationship;Driving;Community Activity;Cleaning    Stability/Clinical Decision Making Stable/Uncomplicated    Rehab Potential Good    PT Frequency 2x / week    PT Duration Other (comment)    PT Treatment/Interventions ADLs/Self Care Home Management;Cryotherapy;Electrical Stimulation;Iontophoresis 4mg /ml Dexamethasone;Moist Heat;Therapeutic exercise;Therapeutic activities;Functional mobility training;DME Instruction;Ultrasound;Neuromuscular re-education;Patient/family education;Passive range of motion;Spinal Manipulations;Joint Manipulations;Dry needling;Taping;Vasopneumatic Device;Manual techniques    PT Next Visit Plan Passive mobility only    PT Home Exercise Plan [id number]    Consulted and Agree with Plan of Care Patient             Patient will benefit from skilled therapeutic intervention in order to improve the following deficits and impairments:  Hypomobility, Increased edema, Decreased activity  tolerance, Decreased strength, Impaired UE functional use, Pain, Difficulty walking, Decreased mobility, Decreased range of motion, Impaired perceived functional ability, Improper body mechanics, Postural dysfunction, Impaired flexibility, Decreased coordination  Visit Diagnosis: Chronic left shoulder pain  Muscle weakness (generalized)  Abnormal posture  Localized edema     Problem List Patient Active Problem List   Diagnosis Date Noted   Nontraumatic tear of left supraspinatus tendon 02/12/2021   Impingement syndrome of left shoulder    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   Shingles 11/14/2020   Neck pain 10/14/2020   Encounter to establish care 09/09/2020   Fatty liver disease, nonalcoholic 38/93/7342   Elevated blood-pressure reading, without diagnosis of hypertension 06/17/2020   Influenza vaccine needed 04/03/2020   Cervical disc disorder at C5-C6 level with radiculopathy 04/03/2020   S/P carpal tunnel release 08/30/2019   Right carpal tunnel syndrome 07/11/2019   Left carpal tunnel syndrome 07/11/2019   Arthritis    Anemia    Seasonal allergies 10/08/2016   Nasal obstruction 02/18/2016   DM2 (diabetes mellitus, type 2) ([place]) 10/21/2015   Bilateral chronic knee pain 09/25/2015   GASTROESOPHAGEAL REFLUX DISEASE 08/14/2009   DEPRESSION, SITUATIONAL, PROLONGED 02/05/2009   Body mass index (BMI) 45.0-49.9, adult ([facility]) 05/30/2007   Moderate persistent asthma with acute exacerbation 12/06/2006    Oretha Caprice, PT, MPT 03/10/2021, 9:30 AM  [facility] [address] [place], [place], [address] Phone: [phone]   Fax:  [phone]  Name: Alexandra Norris MRN: [id number] Date of Birth: 11-29-1975

## 2021-03-12 ENCOUNTER — Ambulatory Visit (INDEPENDENT_AMBULATORY_CARE_PROVIDER_SITE_OTHER): Payer: BC Managed Care – PPO | Admitting: Physical Therapy

## 2021-03-12 ENCOUNTER — Other Ambulatory Visit: Payer: Self-pay

## 2021-03-12 DIAGNOSIS — R600 Localized edema: Secondary | ICD-10-CM | POA: Diagnosis not present

## 2021-03-12 DIAGNOSIS — M6281 Muscle weakness (generalized): Secondary | ICD-10-CM | POA: Diagnosis not present

## 2021-03-12 DIAGNOSIS — R293 Abnormal posture: Secondary | ICD-10-CM | POA: Diagnosis not present

## 2021-03-12 DIAGNOSIS — G8929 Other chronic pain: Secondary | ICD-10-CM

## 2021-03-12 DIAGNOSIS — M25512 Pain in left shoulder: Secondary | ICD-10-CM

## 2021-03-12 NOTE — Therapy (Signed)
[facility] Physical Therapy [address] [place], [place], [address] Phone: [phone]   Fax:  [phone]  Physical Therapy Treatment  Patient Details  Name: Alexandra Norris MRN: [id number] Date of Birth: February 23, 1976 Referring Provider (PT): Dwana Melena PA-C   Encounter Date: 03/12/2021   PT End of Session - 03/12/21 1125     Visit Number 4    Number of Visits 20    Date for PT Re-Evaluation 05/02/21    Authorization Type BC[employer]    Progress Note Due on Visit 10    PT Start Time 1015    PT Stop Time 1047    PT Time Calculation (min) 32 min    Activity Tolerance Patient tolerated treatment well    Behavior During Therapy [facility] for tasks assessed/performed             Past Medical History:  Diagnosis Date   Anemia    Anxiety    Arthritis    Asthma    Carpal tunnel syndrome    Depression    Dyspnea    Endometriosis    History of blood transfusion 1999   w/vaginal delivery   History of bronchitis    "I've stayed in hospital 2 X w/bronchitis"   Migraine    Pneumonia    PONV (postoperative nausea and vomiting)    Pre-diabetes 10/03/2019   A1C 6.4    Past Surgical History:  Procedure Laterality Date   ABDOMINAL HYSTERECTOMY  10/17/2019   APPENDECTOMY     CARPAL TUNNEL RELEASE Right 07/19/2019   Procedure: RIGHT CARPAL TUNNEL RELEASE;  Surgeon: Leandrew Koyanagi, MD;  Location: [place];  Service: Orthopedics;  Laterality: Right;   CARPAL TUNNEL RELEASE Left 09/13/2019   Procedure: LEFT CARPAL TUNNEL RELEASE;  Surgeon: Leandrew Koyanagi, MD;  Location: [place];  Service: Orthopedics;  Laterality: Left;  Bier block   [place]; 2008   CHOLECYSTECTOMY  2011   SHOULDER ARTHROSCOPY WITH DISTAL CLAVICLE RESECTION  02/12/2021   Procedure: SHOULDER ARTHROSCOPY WITH DISTAL CLAVICLE RESECTION;  Surgeon: Leandrew Koyanagi, MD;  Location: [place];  Service: Orthopedics;;   SHOULDER  ARTHROSCOPY WITH ROTATOR CUFF REPAIR AND SUBACROMIAL DECOMPRESSION Left 02/12/2021   Procedure: LEFT SHOULDER ARTHROSCOPY WITH ROTATOR CUFF REPAIR, SUBACROMIAL DECOMPRESSION, EXTENSIVE DEBRIDEMENT;  Surgeon: Leandrew Koyanagi, MD;  Location: [place];  Service: Orthopedics;  Laterality: Left;   TONSILLECTOMY     tonsilletomy     TUBAL LIGATION  04/2007   VAGINAL HYSTERECTOMY Bilateral 10/17/2019   Procedure: HYSTERECTOMY VAGINAL WITH SALPINGECTOMY;  Surgeon: Chancy Milroy, MD;  Location: [place];  Service: Gynecology;  Laterality: Bilateral;    There were no vitals filed for this visit.   Subjective Assessment - 03/12/21 1118     Subjective She relays about 4/10 pain overall in her shoulder, relays compliance with HEP    Patient is accompained by: Interpreter    Patient Stated Goals Reduce pain    Pain Onset More than a month ago                               [facility] Adult PT Treatment/Exercise - 03/12/21 0001       Shoulder Exercises: Seated   Retraction Both;10 reps    Retraction Limitations 5 sec hold    Other Seated Exercises seated tableslide Lt shoulder PROM into fleixon, abd, ER 10 reps  ea holding 5 sec    Other Seated Exercises shoulder rolls posteriorly 15 reps      Shoulder Exercises: Standing   Other Standing Exercises Pendulum F/B; CW; CCW 1 minute each      Manual Therapy   Manual therapy comments Lt shoulder PROM, grade 1-2 GH mobs inferior and A-P                       PT Short Term Goals - 03/10/21 6222       PT SHORT TERM GOAL #1   Title Patient will demonstrate independent use of home exercise program to maintain progress from in clinic treatments.    Status Achieved               PT Long Term Goals - 02/21/21 1225       PT LONG TERM GOAL #1   Title Patient will demonstrate/report pain at worst less than or equal to 2/10 to facilitate minimal limitation in daily activity secondary to pain symptoms.     Time 10    Period Weeks    Status New    Target Date 05/02/21      PT LONG TERM GOAL #2   Title Patient will demonstrate independent use of home exercise program to facilitate ability to maintain/progress functional gains from skilled physical therapy services.    Time 10    Period Weeks    Status New    Target Date 05/02/21      PT LONG TERM GOAL #3   Title Patient will demonstrate return to work/recreational activity at previous level of function without limitations secondary due to condition    Time 10    Period Weeks    Status New    Target Date 05/02/21      PT LONG TERM GOAL #4   Title Pt. will demonstrate FOTO outcome > or = 63% to indicated reduced disability due to condition.    Time 10    Period Weeks    Status New    Target Date 05/02/21      PT LONG TERM GOAL #5   Title Patient will demonstrate Lt [place] joint mobility WFL to facilitate usual self care, dressing, reaching overhead at PLOF s limitation due to symptoms.    Time 10    Period Weeks    Status New    Target Date 05/02/21      Additional Long Term Goals   Additional Long Term Goals Yes      PT LONG TERM GOAL #6   Title Patient will demonstrate Lt UE MMT 4/5 throughout to facilitate usual lifting, carrying in functional activity to PLOF s limitation.    Time 10    Period Weeks    Status New    Target Date 05/02/21                   Plan - 03/12/21 1125     Clinical Impression Statement She had good overall tolerance to PROM activity to improve overall ROM in her Lt shoulder. Her PROM is progressing very well. We will continue with PROM protocol until she is at the 6 week mark from surgery.    Personal Factors and Comorbidities Comorbidity 3+    Comorbidities Depression, anxiety, history of Carpal tunnel syndrome    Examination-Activity Limitations Sleep;Sit;Bed Mobility;Bathing;Carry;Caring for Others;Dressing;Hygiene/Grooming;Lift;Reach Overhead    Examination-Participation Restrictions  Occupation;Meal Prep;Laundry;Interpersonal Relationship;Driving;Community Activity;Cleaning    Stability/Clinical Decision Making Stable/Uncomplicated  Rehab Potential Good    PT Frequency 2x / week    PT Duration Other (comment)    PT Treatment/Interventions ADLs/Self Care Home Management;Cryotherapy;Electrical Stimulation;Iontophoresis 4mg /ml Dexamethasone;Moist Heat;Therapeutic exercise;Therapeutic activities;Functional mobility training;DME Instruction;Ultrasound;Neuromuscular re-education;Patient/family education;Passive range of motion;Spinal Manipulations;Joint Manipulations;Dry needling;Taping;Vasopneumatic Device;Manual techniques    PT Next Visit Plan Passive mobility only    PT Home Exercise Plan [id number]    Consulted and Agree with Plan of Care Patient             Patient will benefit from skilled therapeutic intervention in order to improve the following deficits and impairments:  Hypomobility, Increased edema, Decreased activity tolerance, Decreased strength, Impaired UE functional use, Pain, Difficulty walking, Decreased mobility, Decreased range of motion, Impaired perceived functional ability, Improper body mechanics, Postural dysfunction, Impaired flexibility, Decreased coordination  Visit Diagnosis: Chronic left shoulder pain  Muscle weakness (generalized)  Abnormal posture  Localized edema     Problem List Patient Active Problem List   Diagnosis Date Noted   Nontraumatic tear of left supraspinatus tendon 02/12/2021   Impingement syndrome of left shoulder    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   Shingles 11/14/2020   Neck pain 10/14/2020   Encounter to establish care 09/09/2020   Fatty liver disease, nonalcoholic 02/63/7858   Elevated blood-pressure reading, without diagnosis of hypertension 06/17/2020   Influenza vaccine needed 04/03/2020   Cervical disc disorder at C5-C6 level with radiculopathy 04/03/2020   S/P carpal tunnel release 08/30/2019    Right carpal tunnel syndrome 07/11/2019   Left carpal tunnel syndrome 07/11/2019   Arthritis    Anemia    Seasonal allergies 10/08/2016   Nasal obstruction 02/18/2016   DM2 (diabetes mellitus, type 2) ([place]) 10/21/2015   Bilateral chronic knee pain 09/25/2015   GASTROESOPHAGEAL REFLUX DISEASE 08/14/2009   DEPRESSION, SITUATIONAL, PROLONGED 02/05/2009   Body mass index (BMI) 45.0-49.9, adult (HCC) 05/30/2007   Moderate persistent asthma with acute exacerbation 12/06/2006    Debbe Odea, PT,DPT 03/12/2021, 11:27 AM  [facility] Physical Therapy [address] [place], [place], [address] Phone: [phone]   Fax:  [phone]  Name: Alexandra Norris MRN: [id number] Date of Birth: [birth year]

## 2021-03-19 ENCOUNTER — Encounter: Payer: BC Managed Care – PPO | Admitting: Rehabilitative and Restorative Service Providers"

## 2021-03-20 ENCOUNTER — Encounter: Payer: Self-pay | Admitting: Specialist

## 2021-03-20 ENCOUNTER — Other Ambulatory Visit: Payer: Self-pay

## 2021-03-20 ENCOUNTER — Ambulatory Visit (INDEPENDENT_AMBULATORY_CARE_PROVIDER_SITE_OTHER): Payer: BC Managed Care – PPO | Admitting: Specialist

## 2021-03-20 VITALS — BP 129/83 | HR 80 | Ht 61.0 in | Wt 243.0 lb

## 2021-03-20 DIAGNOSIS — M5010 Cervical disc disorder with radiculopathy, unspecified cervical region: Secondary | ICD-10-CM

## 2021-03-20 DIAGNOSIS — M542 Cervicalgia: Secondary | ICD-10-CM

## 2021-03-20 DIAGNOSIS — M5412 Radiculopathy, cervical region: Secondary | ICD-10-CM

## 2021-03-20 NOTE — Progress Notes (Signed)
Office Visit Note   Patient: Alexandra Norris           Date of Birth: 1976-05-31           MRN: [id number] Visit Date: 03/20/2021              Requested by: Orma Render, NP [address] [place],  [place] [address] PCP: Orma Render, NP   Assessment & Plan: Visit Diagnoses:  1. Radiculopathy of cervical spine   2. Cervicalgia   3. Herniation of cervical intervertebral disc with radiculopathy     Plan: Avoid overhead lifting and overhead use of the arms. Do not lift greater than 5 lbs. Adjust head rest in vehicle to prevent hyperextension if rear ended. Take extra precautions to avoid falling. Myelogram and post myelogram CT scan of the neck is ordered as the previous MRI shows only mild findings. Clinically the exam suggests left C7 nerve compression and the myelogram should confirm this and allow [place] to treat.  Follow-Up Instructions: Return in about 3 weeks (around 04/10/2021).   Orders:  Orders Placed This Encounter  Procedures   DG Myelogram Cervical   CT CERVICAL SPINE W CONTRAST   No orders of the defined types were placed in this encounter.     Procedures: No procedures performed   Clinical Data: No additional findings.   Subjective: Chief Complaint  Patient presents with   Neck - Follow-up    45 year old female with history of left shoulder surgery 5 years ago, history of 3 injections of neck by Dr. Ernestina Patches and Dr. Ernestina Patches recommended no further injections. She did have surgery by Dr. Erlinda Hong for a left shoulder RCT. She has difficulty turning her head and neck to the left but can turn to the right fine. There is neck grating and popping and there is pain and spasm with popping. Had the surgery and the neck pain is worse than the neck now but prior to surgery the left shoulder was more painful. She has difficulty sleeping at night and very little right shoulder pain. There is numbness into the left hand fingertips and the left arm  feels weakn. Underwent MRI of the neck about 2 months ago.    Review of Systems  Constitutional: Negative.   HENT: Negative.    Eyes: Negative.   Respiratory: Negative.    Cardiovascular: Negative.   Gastrointestinal: Negative.   Endocrine: Negative.   Genitourinary: Negative.   Musculoskeletal: Negative.   Skin: Negative.   Allergic/Immunologic: Negative.   Neurological: Negative.   Hematological: Negative.   Psychiatric/Behavioral: Negative.      Objective: Vital Signs: BP 129/83 (BP Location: Left Arm, Patient Position: Sitting)   Pulse 80   Ht 5\' 1"  (1.549 m)   Wt 243 lb (110.2 kg)   LMP 09/20/2019 (Exact Date)   BMI 45.91 kg/m   Physical Exam Constitutional:      Appearance: She is well-developed.  HENT:     Head: Normocephalic and atraumatic.  Eyes:     Pupils: Pupils are equal, round, and reactive to light.  Pulmonary:     Effort: Pulmonary effort is normal.     Breath sounds: Normal breath sounds.  Abdominal:     General: Bowel sounds are normal.     Palpations: Abdomen is soft.  Musculoskeletal:     Cervical back: Normal range of motion and neck supple.     Lumbar back: Negative right straight leg raise test and negative  left straight leg raise test.  Skin:    General: Skin is warm and dry.  Neurological:     Mental Status: She is alert and oriented to person, place, and time.  Psychiatric:        Behavior: Behavior normal.        Thought Content: Thought content normal.        Judgment: Judgment normal.   Back Exam   Tenderness  The patient is experiencing tenderness in the cervical.  Range of Motion  Extension:  abnormal  Flexion:  abnormal  Lateral bend right:  abnormal  Lateral bend left:  abnormal  Rotation right:  abnormal  Rotation left:  abnormal   Muscle Strength  Right Quadriceps:  5/5  Left Quadriceps:  5/5  Right Hamstrings:  5/5  Left Hamstrings:  5/5   Tests  Straight leg raise right: negative Straight leg raise left:  negative  Reflexes  Patellar:  2/4 Achilles:  2/4  Other  Toe walk: normal Heel walk: normal    Specialty Comments:  No specialty comments available.  Imaging: No results found.   PMFS History: Patient Active Problem List   Diagnosis Date Noted   Nontraumatic tear of left supraspinatus tendon 02/12/2021   Impingement syndrome of left shoulder    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   Shingles 11/14/2020   Neck pain 10/14/2020   Encounter to establish care 09/09/2020   Fatty liver disease, nonalcoholic 76/22/6333   Elevated blood-pressure reading, without diagnosis of hypertension 06/17/2020   Influenza vaccine needed 04/03/2020   Cervical disc disorder at C5-C6 level with radiculopathy 04/03/2020   S/P carpal tunnel release 08/30/2019   Right carpal tunnel syndrome 07/11/2019   Left carpal tunnel syndrome 07/11/2019   Arthritis    Anemia    Seasonal allergies 10/08/2016   Nasal obstruction 02/18/2016   DM2 (diabetes mellitus, type 2) ([place]) 10/21/2015   Bilateral chronic knee pain 09/25/2015   GASTROESOPHAGEAL REFLUX DISEASE 08/14/2009   DEPRESSION, SITUATIONAL, PROLONGED 02/05/2009   Body mass index (BMI) 45.0-49.9, adult ([place]) 05/30/2007   Moderate persistent asthma with acute exacerbation 12/06/2006   Past Medical History:  Diagnosis Date   Anemia    Anxiety    Arthritis    Asthma    Carpal tunnel syndrome    Depression    Dyspnea    Endometriosis    History of blood transfusion 1999   w/vaginal delivery   History of bronchitis    "I've stayed in hospital 2 X w/bronchitis"   Migraine    Pneumonia    PONV (postoperative nausea and vomiting)    Pre-diabetes 10/03/2019   A1C 6.4    Family History  Problem Relation Age of Onset   Diabetes Mother    Osteoarthritis Mother    Hypertension Mother    Hyperlipidemia Mother    Arthritis Mother     Past Surgical History:  Procedure Laterality Date   ABDOMINAL HYSTERECTOMY  10/17/2019    APPENDECTOMY     CARPAL TUNNEL RELEASE Right 07/19/2019   Procedure: RIGHT CARPAL TUNNEL RELEASE;  Surgeon: Leandrew Koyanagi, MD;  Location: [place];  Service: Orthopedics;  Laterality: Right;   CARPAL TUNNEL RELEASE Left 09/13/2019   Procedure: LEFT CARPAL TUNNEL RELEASE;  Surgeon: Leandrew Koyanagi, MD;  Location: [place];  Service: Orthopedics;  Laterality: Left;  Bier block   CESAREAN SECTION  1997; 2008   CHOLECYSTECTOMY  2011   SHOULDER ARTHROSCOPY WITH DISTAL CLAVICLE RESECTION  02/12/2021  Procedure: SHOULDER ARTHROSCOPY WITH DISTAL CLAVICLE RESECTION;  Surgeon: Leandrew Koyanagi, MD;  Location: [place];  Service: Orthopedics;;   SHOULDER ARTHROSCOPY WITH ROTATOR CUFF REPAIR AND SUBACROMIAL DECOMPRESSION Left 02/12/2021   Procedure: LEFT SHOULDER ARTHROSCOPY WITH ROTATOR CUFF REPAIR, SUBACROMIAL DECOMPRESSION, EXTENSIVE DEBRIDEMENT;  Surgeon: Leandrew Koyanagi, MD;  Location: [place];  Service: Orthopedics;  Laterality: Left;   TONSILLECTOMY     tonsilletomy     TUBAL LIGATION  04/2007   VAGINAL HYSTERECTOMY Bilateral 10/17/2019   Procedure: HYSTERECTOMY VAGINAL WITH SALPINGECTOMY;  Surgeon: Chancy Milroy, MD;  Location: [place];  Service: Gynecology;  Laterality: Bilateral;   Social History   Occupational History   Not on file  Tobacco Use   Smoking status: Never   Smokeless tobacco: Never  Vaping Use   Vaping Use: Never used  Substance and Sexual Activity   Alcohol use: No   Drug use: No   Sexual activity: Yes    Birth control/protection: Surgical

## 2021-03-20 NOTE — Patient Instructions (Addendum)
Avoid overhead lifting and overhead use of the arms. Do not lift greater than 5 lbs. Adjust head rest in vehicle to prevent hyperextension if rear ended. Take extra precautions to avoid falling. Myelogram and post myelogram CT scan of the neck is ordered as the previous MRI shows only mild findings. Clinically the exam suggests left C7 nerve compression and the myelogram should confirm this and allow [place] to treat.

## 2021-03-24 ENCOUNTER — Other Ambulatory Visit: Payer: Self-pay

## 2021-03-24 ENCOUNTER — Other Ambulatory Visit: Payer: Self-pay | Admitting: Specialist

## 2021-03-24 ENCOUNTER — Ambulatory Visit (INDEPENDENT_AMBULATORY_CARE_PROVIDER_SITE_OTHER): Payer: BC Managed Care – PPO | Admitting: Physical Therapy

## 2021-03-24 DIAGNOSIS — M25512 Pain in left shoulder: Secondary | ICD-10-CM

## 2021-03-24 DIAGNOSIS — G8929 Other chronic pain: Secondary | ICD-10-CM

## 2021-03-24 DIAGNOSIS — R600 Localized edema: Secondary | ICD-10-CM

## 2021-03-24 DIAGNOSIS — R293 Abnormal posture: Secondary | ICD-10-CM

## 2021-03-24 DIAGNOSIS — M6281 Muscle weakness (generalized): Secondary | ICD-10-CM

## 2021-03-24 NOTE — Therapy (Addendum)
[facility] [address] [place], [place], [address] Phone: [phone]   Fax:  [phone]  Physical Therapy Treatment  Patient Details  Name: Alexandra Norris MRN: [id number] Date of Birth: [birth year] Referring Provider (PT): Dwana Melena PA-C   Encounter Date: 03/24/2021   PT End of Session - 03/24/21 0852     Visit Number 5    Number of Visits 20    Date for PT Re-Evaluation 05/02/21    Authorization Type BCBS    Progress Note Due on Visit 10    PT Start Time [phone]    PT Stop Time 0921    PT Time Calculation (min) 38 min    Activity Tolerance Patient tolerated treatment well    Behavior During Therapy [facility] for tasks assessed/performed             Past Medical History:  Diagnosis Date   Anemia    Anxiety    Arthritis    Asthma    Carpal tunnel syndrome    Depression    Dyspnea    Endometriosis    History of blood transfusion 1999   w/vaginal delivery   History of bronchitis    "I've stayed in hospital 2 X w/bronchitis"   Migraine    Pneumonia    PONV (postoperative nausea and vomiting)    Pre-diabetes 10/03/2019   A1C 6.4    Past Surgical History:  Procedure Laterality Date   ABDOMINAL HYSTERECTOMY  10/17/2019   APPENDECTOMY     CARPAL TUNNEL RELEASE Right 07/19/2019   Procedure: RIGHT CARPAL TUNNEL RELEASE;  Surgeon: Leandrew Koyanagi, MD;  Location: [place];  Service: Orthopedics;  Laterality: Right;   CARPAL TUNNEL RELEASE Left 09/13/2019   Procedure: LEFT CARPAL TUNNEL RELEASE;  Surgeon: Leandrew Koyanagi, MD;  Location: [place];  Service: Orthopedics;  Laterality: Left;  Bier block   [place]; 2008   CHOLECYSTECTOMY  2011   SHOULDER ARTHROSCOPY WITH DISTAL CLAVICLE RESECTION  02/12/2021   Procedure: SHOULDER ARTHROSCOPY WITH DISTAL CLAVICLE RESECTION;  Surgeon: Leandrew Koyanagi, MD;  Location: [place];  Service: Orthopedics;;   SHOULDER  ARTHROSCOPY WITH ROTATOR CUFF REPAIR AND SUBACROMIAL DECOMPRESSION Left 02/12/2021   Procedure: LEFT SHOULDER ARTHROSCOPY WITH ROTATOR CUFF REPAIR, SUBACROMIAL DECOMPRESSION, EXTENSIVE DEBRIDEMENT;  Surgeon: Leandrew Koyanagi, MD;  Location: [place];  Service: Orthopedics;  Laterality: Left;   TONSILLECTOMY     tonsilletomy     TUBAL LIGATION  04/2007   VAGINAL HYSTERECTOMY Bilateral 10/17/2019   Procedure: HYSTERECTOMY VAGINAL WITH SALPINGECTOMY;  Surgeon: Chancy Milroy, MD;  Location: [place];  Service: Gynecology;  Laterality: Bilateral;    There were no vitals filed for this visit.   Subjective Assessment - 03/24/21 0849     Subjective Pt arriving to therapy reporting 5/10 pain in her left shoulder. Pt reporting compliance with her HEP and wearing her sling.    Patient is accompained by: Interpreter    Currently in Pain? Yes    Pain Score 5     Pain Location Shoulder    Pain Orientation Left    Pain Descriptors / Indicators Sore    Pain Onset More than a month ago                [facility] PT Assessment - 03/24/21 0001       Assessment   Medical Diagnosis [id number]    Referring Provider (PT) Dwana Melena PA-C  Onset Date/Surgical Date 02/12/21      PROM   Left Shoulder Flexion 148 Degrees    Left Shoulder ABduction 115 Degrees    Left Shoulder Internal Rotation 70 Degrees   in 45 degrees abduction   Left Shoulder External Rotation 52 Degrees   in 45 degrees abduction                          [facility] Adult PT Treatment/Exercise - 03/24/21 0001       Exercises   Exercises Shoulder      Shoulder Exercises: Seated   Other Seated Exercises table slides: flexion/ abductuion/ ER and scaption all x 10 holding 5-10 seconds each    Other Seated Exercises scapular retraction, shoulder rolls x 10, elbow flexion x 10      Shoulder Exercises: Standing   Other Standing Exercises Pendulum F/B; CW; CCW 1 minute each      Shoulder Exercises: Stretch    Other Shoulder Stretches upper trap stretch, levator stretch x 3 holding 10 seconds each      Manual Therapy   Manual therapy comments Lt shoulder PROM, grade 1-2 GH mobs inferior and A-P                       PT Short Term Goals - 03/24/21 0903       PT SHORT TERM GOAL #1   Title Patient will demonstrate independent use of home exercise program to maintain progress from in clinic treatments.    Status Achieved               PT Long Term Goals - 03/24/21 0904       PT LONG TERM GOAL #1   Title Patient will demonstrate/report pain at worst less than or equal to 2/10 to facilitate minimal limitation in daily activity secondary to pain symptoms.    Status On-going      PT LONG TERM GOAL #2   Title Patient will demonstrate independent use of home exercise program to facilitate ability to maintain/progress functional gains from skilled physical therapy services.    Status On-going      PT LONG TERM GOAL #3   Title Patient will demonstrate return to work/recreational activity at previous level of function without limitations secondary due to condition    Status On-going      PT LONG TERM GOAL #4   Title Pt. will demonstrate FOTO outcome > or = 63% to indicated reduced disability due to condition.    Status On-going      PT LONG TERM GOAL #5   Title Patient will demonstrate Lt [place] joint mobility WFL to facilitate usual self care, dressing, reaching overhead at PLOF s limitation due to symptoms.    Status On-going      PT LONG TERM GOAL #6   Title Patient will demonstrate Lt UE MMT 4/5 throughout to facilitate usual lifting, carrying in functional activity to PLOF s limitation.    Status On-going                   Plan - 03/24/21 0853     Clinical Impression Statement Pt reporting more soreness today around anterior lateral joint line of her left shoulder. Pt is tolerating ROM exercises well. PROM performed this visit due to protocol. At next visit pt  will be 6 weeks post op and will be able to advance her protocol as tolerated. Continue  skilled PT to maximize function.    Personal Factors and Comorbidities Comorbidity 3+    Comorbidities Depression, anxiety, history of Carpal tunnel syndrome    Examination-Activity Limitations Sleep;Sit;Bed Mobility;Bathing;Carry;Caring for Others;Dressing;Hygiene/Grooming;Lift;Reach Overhead    Examination-Participation Restrictions Occupation;Meal Prep;Laundry;Interpersonal Relationship;Driving;Community Activity;Cleaning    Stability/Clinical Decision Making Stable/Uncomplicated    Rehab Potential Good    PT Frequency 2x / week    PT Duration Other (comment)    PT Treatment/Interventions ADLs/Self Care Home Management;Cryotherapy;Electrical Stimulation;Iontophoresis 4mg /ml Dexamethasone;Moist Heat;Therapeutic exercise;Therapeutic activities;Functional mobility training;DME Instruction;Ultrasound;Neuromuscular re-education;Patient/family education;Passive range of motion;Spinal Manipulations;Joint Manipulations;Dry needling;Taping;Vasopneumatic Device;Manual techniques    PT Next Visit Plan 03/26/2021, pt will be 6 weeks post op, progress protocol accordingly    PT Home Exercise Plan [id number]    Consulted and Agree with Plan of Care Patient             Patient will benefit from skilled therapeutic intervention in order to improve the following deficits and impairments:  Hypomobility, Increased edema, Decreased activity tolerance, Decreased strength, Impaired UE functional use, Pain, Difficulty walking, Decreased mobility, Decreased range of motion, Impaired perceived functional ability, Improper body mechanics, Postural dysfunction, Impaired flexibility, Decreased coordination  Visit Diagnosis: Chronic left shoulder pain  Muscle weakness (generalized)  Abnormal posture  Localized edema     Problem List Patient Active Problem List   Diagnosis Date Noted   Nontraumatic tear of left  supraspinatus tendon 02/12/2021   Impingement syndrome of left shoulder    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   Shingles 11/14/2020   Neck pain 10/14/2020   Encounter to establish care 09/09/2020   Fatty liver disease, nonalcoholic 27/11/2374   Elevated blood-pressure reading, without diagnosis of hypertension 06/17/2020   Influenza vaccine needed 04/03/2020   Cervical disc disorder at C5-C6 level with radiculopathy 04/03/2020   S/P carpal tunnel release 08/30/2019   Right carpal tunnel syndrome 07/11/2019   Left carpal tunnel syndrome 07/11/2019   Arthritis    Anemia    Seasonal allergies 10/08/2016   Nasal obstruction 02/18/2016   DM2 (diabetes mellitus, type 2) ([place]) 10/21/2015   Bilateral chronic knee pain 09/25/2015   GASTROESOPHAGEAL REFLUX DISEASE 08/14/2009   DEPRESSION, SITUATIONAL, PROLONGED 02/05/2009   Body mass index (BMI) 45.0-49.9, adult ([place]) 05/30/2007   Moderate persistent asthma with acute exacerbation 12/06/2006    Oretha Caprice, PT, MPT 03/24/2021, 12:32 PM  [facility] [address] [place], [place], [address] Phone: [phone]   Fax:  [phone]  Name: Alexandra Norris MRN: [id number] Date of Birth: [birth year]

## 2021-03-26 ENCOUNTER — Encounter: Payer: Self-pay | Admitting: Rehabilitative and Restorative Service Providers"

## 2021-03-26 ENCOUNTER — Ambulatory Visit: Payer: Self-pay

## 2021-03-26 ENCOUNTER — Ambulatory Visit (INDEPENDENT_AMBULATORY_CARE_PROVIDER_SITE_OTHER): Payer: BC Managed Care – PPO | Admitting: Rehabilitative and Restorative Service Providers"

## 2021-03-26 ENCOUNTER — Encounter: Payer: Self-pay | Admitting: Orthopaedic Surgery

## 2021-03-26 ENCOUNTER — Encounter: Payer: BC Managed Care – PPO | Admitting: Rehabilitative and Restorative Service Providers"

## 2021-03-26 ENCOUNTER — Other Ambulatory Visit: Payer: Self-pay

## 2021-03-26 ENCOUNTER — Ambulatory Visit (INDEPENDENT_AMBULATORY_CARE_PROVIDER_SITE_OTHER): Payer: BC Managed Care – PPO | Admitting: Orthopaedic Surgery

## 2021-03-26 DIAGNOSIS — Z9889 Other specified postprocedural states: Secondary | ICD-10-CM | POA: Diagnosis not present

## 2021-03-26 DIAGNOSIS — R600 Localized edema: Secondary | ICD-10-CM | POA: Diagnosis not present

## 2021-03-26 DIAGNOSIS — M6281 Muscle weakness (generalized): Secondary | ICD-10-CM | POA: Diagnosis not present

## 2021-03-26 DIAGNOSIS — M25512 Pain in left shoulder: Secondary | ICD-10-CM

## 2021-03-26 DIAGNOSIS — G8929 Other chronic pain: Secondary | ICD-10-CM

## 2021-03-26 DIAGNOSIS — M75102 Unspecified rotator cuff tear or rupture of left shoulder, not specified as traumatic: Secondary | ICD-10-CM

## 2021-03-26 DIAGNOSIS — M25511 Pain in right shoulder: Secondary | ICD-10-CM | POA: Diagnosis not present

## 2021-03-26 DIAGNOSIS — R293 Abnormal posture: Secondary | ICD-10-CM | POA: Diagnosis not present

## 2021-03-26 NOTE — Therapy (Signed)
[facility] [address] [place], [place], [address] Phone: [phone]   Fax:  [phone]  Physical Therapy Treatment  Patient Details  Name: Alexandra Norris MRN: [id number] Date of Birth: December 29, 1975 Referring Provider (PT): Dwana Melena PA-C   Encounter Date: 03/26/2021   PT End of Session - 03/26/21 0948     Visit Number 6    Number of Visits 20    Date for PT Re-Evaluation 05/02/21    Authorization Type BCBS    Progress Note Due on Visit 10    PT Start Time 1005    PT Stop Time 1044    PT Time Calculation (min) 39 min    Activity Tolerance Patient tolerated treatment well    Behavior During Therapy [facility] for tasks assessed/performed             Past Medical History:  Diagnosis Date   Anemia    Anxiety    Arthritis    Asthma    Carpal tunnel syndrome    Depression    Dyspnea    Endometriosis    History of blood transfusion 1999   w/vaginal delivery   History of bronchitis    "I've stayed in hospital 2 X w/bronchitis"   Migraine    Pneumonia    PONV (postoperative nausea and vomiting)    Pre-diabetes 10/03/2019   A1C 6.4    Past Surgical History:  Procedure Laterality Date   ABDOMINAL HYSTERECTOMY  10/17/2019   APPENDECTOMY     CARPAL TUNNEL RELEASE Right 07/19/2019   Procedure: RIGHT CARPAL TUNNEL RELEASE;  Surgeon: Leandrew Koyanagi, MD;  Location: [place];  Service: Orthopedics;  Laterality: Right;   CARPAL TUNNEL RELEASE Left 09/13/2019   Procedure: LEFT CARPAL TUNNEL RELEASE;  Surgeon: Leandrew Koyanagi, MD;  Location: [place];  Service: Orthopedics;  Laterality: Left;  Bier block   [place]; 2008   CHOLECYSTECTOMY  2011   SHOULDER ARTHROSCOPY WITH DISTAL CLAVICLE RESECTION  02/12/2021   Procedure: SHOULDER ARTHROSCOPY WITH DISTAL CLAVICLE RESECTION;  Surgeon: Leandrew Koyanagi, MD;  Location: [place];  Service: Orthopedics;;   SHOULDER  ARTHROSCOPY WITH ROTATOR CUFF REPAIR AND SUBACROMIAL DECOMPRESSION Left 02/12/2021   Procedure: LEFT SHOULDER ARTHROSCOPY WITH ROTATOR CUFF REPAIR, SUBACROMIAL DECOMPRESSION, EXTENSIVE DEBRIDEMENT;  Surgeon: Leandrew Koyanagi, MD;  Location: [place];  Service: Orthopedics;  Laterality: Left;   TONSILLECTOMY     tonsilletomy     TUBAL LIGATION  04/2007   VAGINAL HYSTERECTOMY Bilateral 10/17/2019   Procedure: HYSTERECTOMY VAGINAL WITH SALPINGECTOMY;  Surgeon: Chancy Milroy, MD;  Location: [place];  Service: Gynecology;  Laterality: Bilateral;    There were no vitals filed for this visit.   Subjective Assessment - 03/26/21 0952     Subjective MD note from today's visit prior to therapy visit indicated strengthening able to begin as well as out of sling.  Will have an MRI performed on Rt shoulder as well.  Pt. indicated pain in upper Lt arm around 4/10 today.    Patient is accompained by: Interpreter    Patient Stated Goals Reduce pain    Currently in Pain? Yes    Pain Score 4     Pain Location Shoulder    Pain Orientation Left    Pain Descriptors / Indicators Sore;Aching    Pain Onset More than a month ago    Pain Frequency Intermittent    Aggravating Factors  arm  movements    Pain Relieving Factors rest, medicine                [facility] PT Assessment - 03/26/21 0001       Assessment   Medical Diagnosis Z98.890 (ICD-10-CM) - S/P arthroscopy of left shoulder    Referring Provider (PT) Dwana Melena PA-C    Onset Date/Surgical Date 02/12/21      Precautions   Precaution Comments Per MD note from 03/26/2021 - able to perform strengthening progression      PROM   Left Shoulder External Rotation 65 Degrees   in supine 45 deg abduction                          [facility] Adult PT Treatment/Exercise - 03/26/21 0001       Exercises   Other Exercises  HEP progression c cues and demonstration/performance      Shoulder Exercises: Supine   Flexion  Both;AAROM   2 x 10 c wand     Shoulder Exercises: Standing   Extension Both;20 reps    Theraband Level (Shoulder Extension) Level 3 (Green)    Row Both   3 x 10   Theraband Level (Shoulder Row) Level 3 (Green)      Shoulder Exercises: Pulleys   Flexion 2 minutes    Scaption 2 minutes      Shoulder Exercises: Isometric Strengthening   Other Isometric Exercises 5 sec hold x 10 Lt shoulder flexion, abd, er, ir in doorway      Manual Therapy   Manual therapy comments Lt shoulder inferior g3 mobs in flexion, scaption and abduction, Mobilization c movement to Lt shoulder with posterior glide c ER in 60 deg abduction                     PT Education - 03/26/21 1013     Education Details Progression of HEP to include AAROM, isometrics    Person(s) Educated Patient    Methods Explanation;Demonstration;Verbal cues;Handout    Comprehension Returned demonstration;Verbalized understanding              PT Short Term Goals - 03/24/21 0903       PT SHORT TERM GOAL #1   Title Patient will demonstrate independent use of home exercise program to maintain progress from in clinic treatments.    Status Achieved               PT Long Term Goals - 03/24/21 0904       PT LONG TERM GOAL #1   Title Patient will demonstrate/report pain at worst less than or equal to 2/10 to facilitate minimal limitation in daily activity secondary to pain symptoms.    Status On-going      PT LONG TERM GOAL #2   Title Patient will demonstrate independent use of home exercise program to facilitate ability to maintain/progress functional gains from skilled physical therapy services.    Status On-going      PT LONG TERM GOAL #3   Title Patient will demonstrate return to work/recreational activity at previous level of function without limitations secondary due to condition    Status On-going      PT LONG TERM GOAL #4   Title Pt. will demonstrate FOTO outcome > or = 63% to indicated reduced  disability due to condition.    Status On-going      PT LONG TERM GOAL #5   Title Patient  will demonstrate Lt [place] joint mobility WFL to facilitate usual self care, dressing, reaching overhead at PLOF s limitation due to symptoms.    Status On-going      PT LONG TERM GOAL #6   Title Patient will demonstrate Lt UE MMT 4/5 throughout to facilitate usual lifting, carrying in functional activity to PLOF s limitation.    Status On-going                   Plan - 03/26/21 1019     Clinical Impression Statement Pt. tolerated introduction of isometrics and AAROM good overall today and added to use for home to begin progression towards AROM improvement to [facility].  Overall mobility passively gaining at this time. Continued skilled PT services indicated at this time.    Personal Factors and Comorbidities Comorbidity 3+    Comorbidities Depression, anxiety, history of Carpal tunnel syndrome    Examination-Activity Limitations Sleep;Sit;Bed Mobility;Bathing;Carry;Caring for Others;Dressing;Hygiene/Grooming;Lift;Reach Overhead    Examination-Participation Restrictions Occupation;Meal Prep;Laundry;Interpersonal Relationship;Driving;Community Activity;Cleaning    Stability/Clinical Decision Making Stable/Uncomplicated    Rehab Potential Good    PT Frequency 2x / week    PT Duration Other (comment)    PT Treatment/Interventions ADLs/Self Care Home Management;Cryotherapy;Electrical Stimulation;Iontophoresis 4mg /ml Dexamethasone;Moist Heat;Therapeutic exercise;Therapeutic activities;Functional mobility training;DME Instruction;Ultrasound;Neuromuscular re-education;Patient/family education;Passive range of motion;Spinal Manipulations;Joint Manipulations;Dry needling;Taping;Vasopneumatic Device;Manual techniques    PT Next Visit Plan supine and sidelying AAROM/AROM transitioning as tolerated.  Avoid shrug    PT Home Exercise Plan [email or web address]    Consulted and Agree with Plan of Care Patient              Patient will benefit from skilled therapeutic intervention in order to improve the following deficits and impairments:  Hypomobility, Increased edema, Decreased activity tolerance, Decreased strength, Impaired UE functional use, Pain, Difficulty walking, Decreased mobility, Decreased range of motion, Impaired perceived functional ability, Improper body mechanics, Postural dysfunction, Impaired flexibility, Decreased coordination  Visit Diagnosis: Chronic left shoulder pain  Muscle weakness (generalized)  Abnormal posture  Localized edema     Problem List Patient Active Problem List   Diagnosis Date Noted   Nontraumatic tear of left supraspinatus tendon 02/12/2021   Impingement syndrome of left shoulder    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   Shingles 11/14/2020   Neck pain 10/14/2020   Encounter to establish care 09/09/2020   Fatty liver disease, nonalcoholic 70/78/6754   Elevated blood-pressure reading, without diagnosis of hypertension 06/17/2020   Influenza vaccine needed 04/03/2020   Cervical disc disorder at C5-C6 level with radiculopathy 04/03/2020   S/P carpal tunnel release 08/30/2019   Right carpal tunnel syndrome 07/11/2019   Left carpal tunnel syndrome 07/11/2019   Arthritis    Anemia    Seasonal allergies 10/08/2016   Nasal obstruction 02/18/2016   DM2 (diabetes mellitus, type 2) ([place]) 10/21/2015   Bilateral chronic knee pain 09/25/2015   GASTROESOPHAGEAL REFLUX DISEASE 08/14/2009   DEPRESSION, SITUATIONAL, PROLONGED 02/05/2009   Body mass index (BMI) 45.0-49.9, adult ([place]) 05/30/2007   Moderate persistent asthma with acute exacerbation 12/06/2006   Scot Jun, PT, DPT, OCS, ATC 03/26/21  10:54 AM    [facility] [address] [place], [place], [address] Phone: [phone]   Fax:  [phone]  Name: Alexandra Norris MRN: [id number] Date of Birth: Dec 28, 1975

## 2021-03-26 NOTE — Patient Instructions (Signed)
Access Code: [email or web address] URL: [email or web address] Date: 03/26/2021 Prepared by: Scot Jun  Exercises Seated Scapular Retraction - 2 x daily - 7 x weekly - 1 sets - 10-15 reps - 5 hold Standing Circular Shoulder Pendulum Supported with Arm Bent (Mirrored) - 2 x daily - 7 x weekly - 3 sets - 10 reps Standing Flexion Extension Shoulder Pendulum Supported with Arm Bent (Mirrored) - 2 x daily - 7 x weekly - 3 sets - 10 reps Isometric Shoulder Flexion at Wall - 2 x daily - 5 x weekly - 5 reps - 1 sets - 5 hold Isometric Shoulder Abduction at Wall - 2 x daily - 5 x weekly - 5 reps - 1 sets - 5 hold Isometric Shoulder External Rotation at Wall - 2 x daily - 5 x weekly - 5 reps - 1 sets - 5 hold Standing Isometric Shoulder Internal Rotation at Doorway (Mirrored) - 2 x daily - 5 x weekly - 1 sets - 2 reps - 2 hold Supine Shoulder Flexion Extension AAROM with [place] - 2 x daily - 7 x weekly - 10 reps - 3 sets - 5 hold

## 2021-03-26 NOTE — Progress Notes (Signed)
Office Visit Note   Patient: Alexandra Norris           Date of Birth: December 09, 1975           MRN: [id number] Visit Date: 03/26/2021              Requested by: Orma Render, NP [place],  [place] [address] PCP: Orma Render, NP   Assessment & Plan: Visit Diagnoses:  1. S/P arthroscopy of left shoulder   2. Nontraumatic tear of supraspinatus tendon, left   3. Chronic left shoulder pain   4. Chronic right shoulder pain     Plan: In regards to the left shoulder and rotator cuff repair she is doing well.  She has some slight tenderness to the bicipital groove which is likely related to the tenotomy.  She does not have a Popeye deformity.  She may discontinue the sling at this point and continue with physical therapy and can begin strengthening.  In regards to the right shoulder with her symptoms being reminiscent of the left shoulder we will obtain MRI to rule out structural abnormalities.  Follow-Up Instructions: No follow-ups on file.   Orders:  Orders Placed This Encounter  Procedures   XR Shoulder Right   MR SHOULDER RIGHT WO CONTRAST   No orders of the defined types were placed in this encounter.     Procedures: No procedures performed   Clinical Data: No additional findings.   Subjective: Chief Complaint  Patient presents with   Left Shoulder - Pain    Alexandra Norris is 6-week status post left rotator cuff repair.  She is doing physical therapy in our office twice a week.  Overall doing well without any real complaints.  Also asking to be evaluated for separate problem of right shoulder pain for 2 months without injury.  She experiences similar pain and symptoms to the left shoulder.  She has never had formal evaluation for this.  She has been seen by Dr. Merrilee Seashore for cervical spine radiculopathy.   Review of Systems   Objective: Vital Signs: LMP 09/20/2019 (Exact Date)   Physical Exam  Ortho Exam  Left  shoulder shows fully healed surgical scars.  Passive forward flexion to 150 degrees.  Passive abduction to 95 degrees.  External rotation to 40 degrees.  Right shoulder shows normal passive and active range of motion with moderate pain.  Positive empty can and Hawkins sign without weakness.  Specialty Comments:  No specialty comments available.  Imaging: XR Shoulder Right  Result Date: 03/26/2021 Degenerative changes of the [facility] joint.  Otherwise unremarkable xray.    PMFS History: Patient Active Problem List   Diagnosis Date Noted   Nontraumatic tear of left supraspinatus tendon 02/12/2021   Impingement syndrome of left shoulder    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   Shingles 11/14/2020   Neck pain 10/14/2020   Encounter to establish care 09/09/2020   Fatty liver disease, nonalcoholic 87/86/7672   Elevated blood-pressure reading, without diagnosis of hypertension 06/17/2020   Influenza vaccine needed 04/03/2020   Cervical disc disorder at C5-C6 level with radiculopathy 04/03/2020   S/P carpal tunnel release 08/30/2019   Right carpal tunnel syndrome 07/11/2019   Left carpal tunnel syndrome 07/11/2019   Arthritis    Anemia    Seasonal allergies 10/08/2016   Nasal obstruction 02/18/2016   DM2 (diabetes mellitus, type 2) ([place]) 10/21/2015   Bilateral chronic knee pain 09/25/2015   GASTROESOPHAGEAL REFLUX DISEASE 08/14/2009  DEPRESSION, SITUATIONAL, PROLONGED 02/05/2009   Body mass index (BMI) 45.0-49.9, adult ([place]) 05/30/2007   Moderate persistent asthma with acute exacerbation 12/06/2006   Past Medical History:  Diagnosis Date   Anemia    Anxiety    Arthritis    Asthma    Carpal tunnel syndrome    Depression    Dyspnea    Endometriosis    History of blood transfusion 1999   w/vaginal delivery   History of bronchitis    "I've stayed in hospital 2 X w/bronchitis"   Migraine    Pneumonia    PONV (postoperative nausea and vomiting)    Pre-diabetes 10/03/2019    A1C 6.4    Family History  Problem Relation Age of Onset   Diabetes Mother    Osteoarthritis Mother    Hypertension Mother    Hyperlipidemia Mother    Arthritis Mother     Past Surgical History:  Procedure Laterality Date   ABDOMINAL HYSTERECTOMY  10/17/2019   APPENDECTOMY     CARPAL TUNNEL RELEASE Right 07/19/2019   Procedure: RIGHT CARPAL TUNNEL RELEASE;  Surgeon: Leandrew Koyanagi, MD;  Location: [place];  Service: Orthopedics;  Laterality: Right;   CARPAL TUNNEL RELEASE Left 09/13/2019   Procedure: LEFT CARPAL TUNNEL RELEASE;  Surgeon: Leandrew Koyanagi, MD;  Location: [place];  Service: Orthopedics;  Laterality: Left;  Bier block   [place]; 2008   CHOLECYSTECTOMY  2011   SHOULDER ARTHROSCOPY WITH DISTAL CLAVICLE RESECTION  02/12/2021   Procedure: SHOULDER ARTHROSCOPY WITH DISTAL CLAVICLE RESECTION;  Surgeon: Leandrew Koyanagi, MD;  Location: [place];  Service: Orthopedics;;   SHOULDER ARTHROSCOPY WITH ROTATOR CUFF REPAIR AND SUBACROMIAL DECOMPRESSION Left 02/12/2021   Procedure: LEFT SHOULDER ARTHROSCOPY WITH ROTATOR CUFF REPAIR, SUBACROMIAL DECOMPRESSION, EXTENSIVE DEBRIDEMENT;  Surgeon: Leandrew Koyanagi, MD;  Location: [place];  Service: Orthopedics;  Laterality: Left;   TONSILLECTOMY     tonsilletomy     TUBAL LIGATION  04/2007   VAGINAL HYSTERECTOMY Bilateral 10/17/2019   Procedure: HYSTERECTOMY VAGINAL WITH SALPINGECTOMY;  Surgeon: Chancy Milroy, MD;  Location: [place];  Service: Gynecology;  Laterality: Bilateral;   Social History   Occupational History   Not on file  Tobacco Use   Smoking status: Never   Smokeless tobacco: Never  Vaping Use   Vaping Use: Never used  Substance and Sexual Activity   Alcohol use: No   Drug use: No   Sexual activity: Yes    Birth control/protection: Surgical

## 2021-03-28 ENCOUNTER — Other Ambulatory Visit: Payer: BC Managed Care – PPO

## 2021-03-31 ENCOUNTER — Encounter: Payer: BC Managed Care – PPO | Admitting: Physical Therapy

## 2021-04-01 ENCOUNTER — Ambulatory Visit
Admission: RE | Admit: 2021-04-01 | Discharge: 2021-04-01 | Disposition: A | Discharge status: Home / Self Care | Payer: BC Managed Care – PPO | Source: Ambulatory Visit | Attending: Specialist | Admitting: Specialist

## 2021-04-01 ENCOUNTER — Other Ambulatory Visit: Payer: Self-pay

## 2021-04-01 DIAGNOSIS — M542 Cervicalgia: Secondary | ICD-10-CM

## 2021-04-01 DIAGNOSIS — M5010 Cervical disc disorder with radiculopathy, unspecified cervical region: Secondary | ICD-10-CM

## 2021-04-01 DIAGNOSIS — M5412 Radiculopathy, cervical region: Secondary | ICD-10-CM

## 2021-04-01 IMAGING — CT CT CERVICAL SPINE W/ CM
2 series · 10 of 14 positions shown, 12 images · non-contrast
Comparison: MRI cervical spine dated [DATE].

CLINICAL DATA: Chronic neck pain and left arm radiculopathy. Prior
cervical fusion last year.
TECHNIQUE: Contiguous axial images were obtained through the cervical spine
after the intrathecal infusion of contrast. Coronal and sagittal
reconstructions were obtained of the axial image sets.

[Series 2: cspine soft · axial · 0.31mm/px · z∈[-285,-155]mm · 5 of 99 slices shown, 7 images]
[im 17/99  soft-tissue]
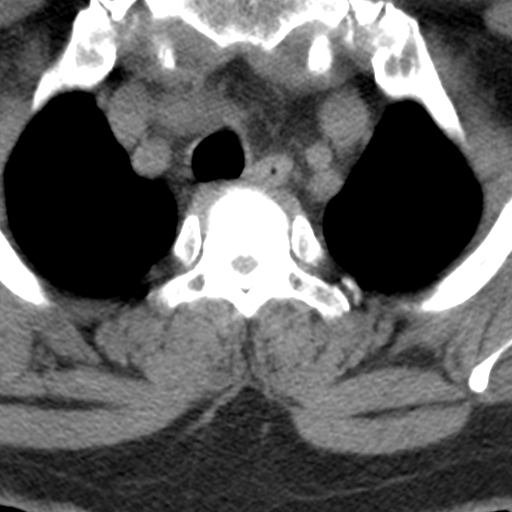
[im 17/99  bone]
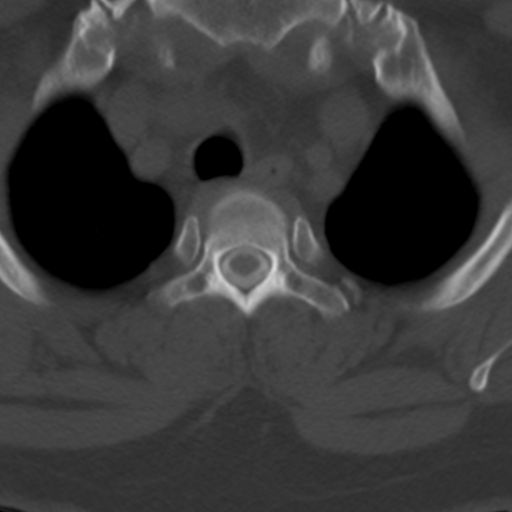
[im 33/99  bone]
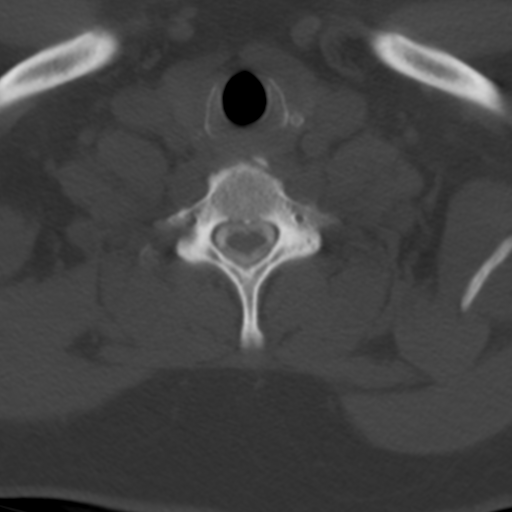
[im 50/99  bone]
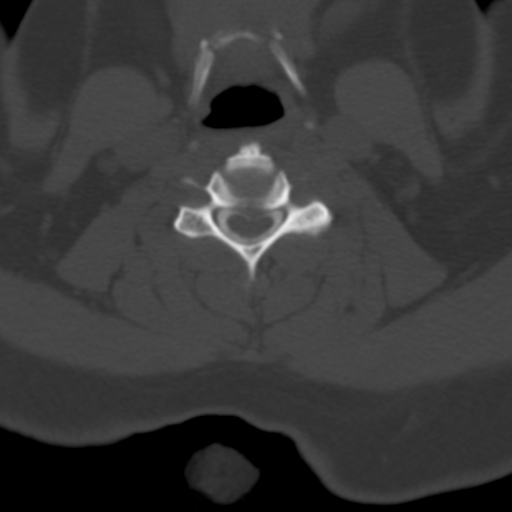
[im 66/99  bone]
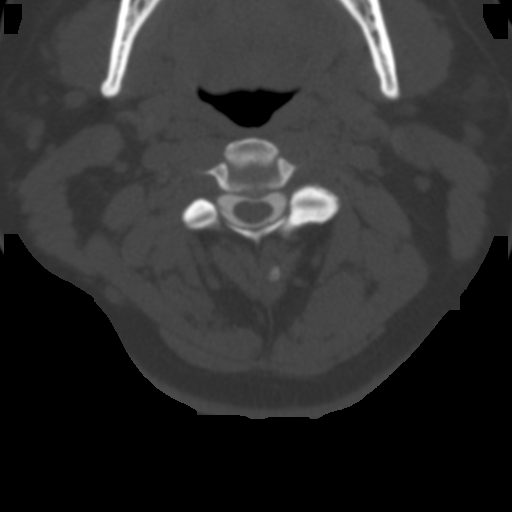
[im 82/99  soft-tissue]
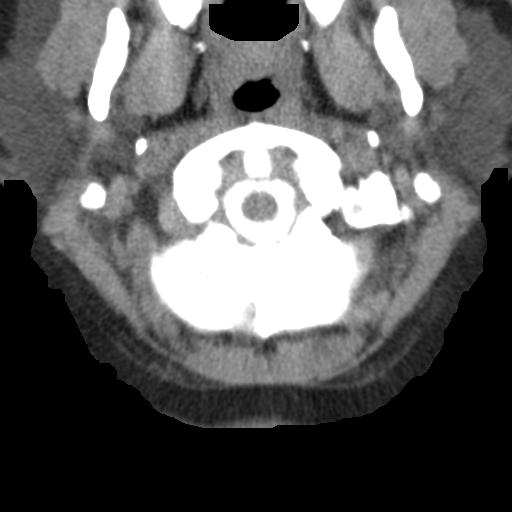
[im 82/99  bone]
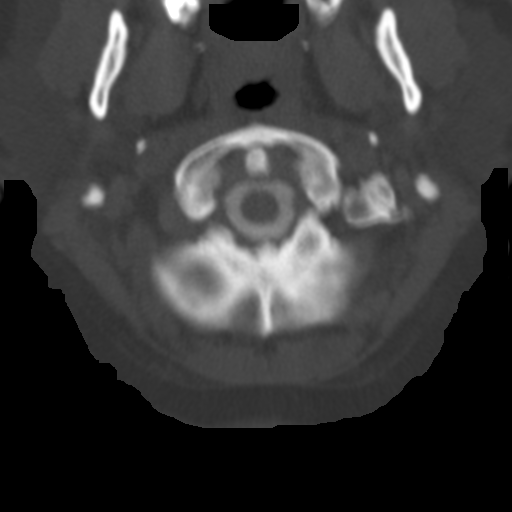

[Series 8: angled axial soft · axial · 0.29mm/px · z∈[-285,-158]mm · 5 of 98 slices shown]
[im 17/98  soft-tissue]
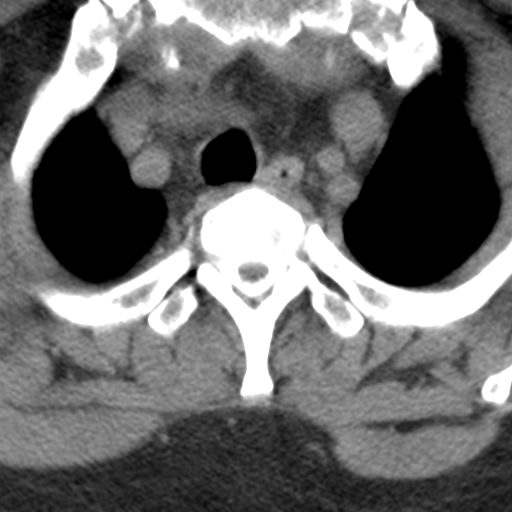
[im 33/98  soft-tissue]
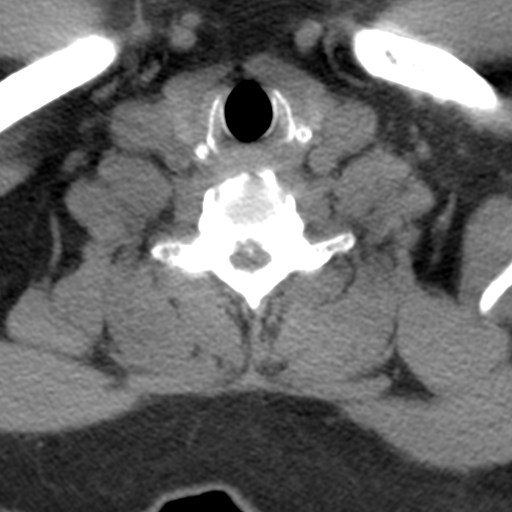
[im 49/98  soft-tissue]
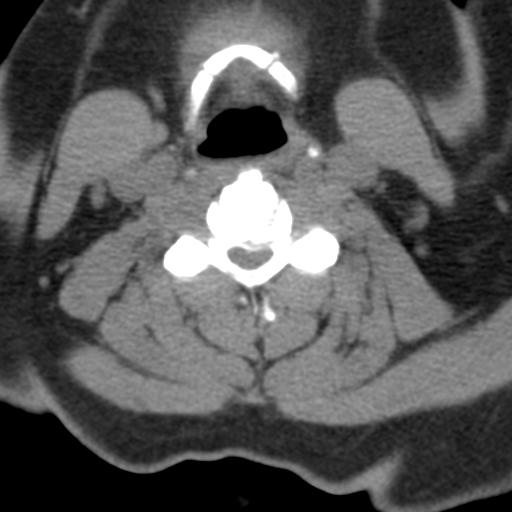
[im 65/98  soft-tissue]
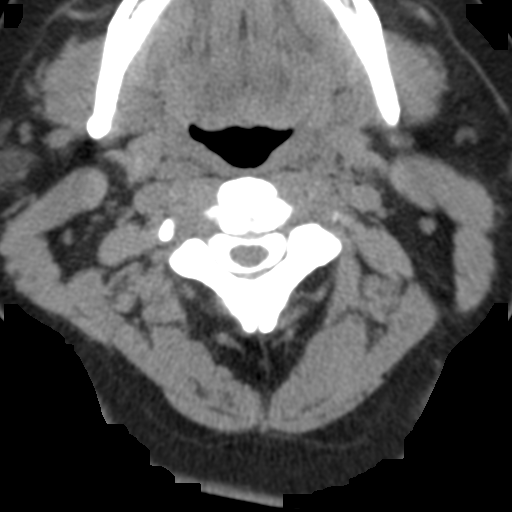
[im 81/98  soft-tissue]
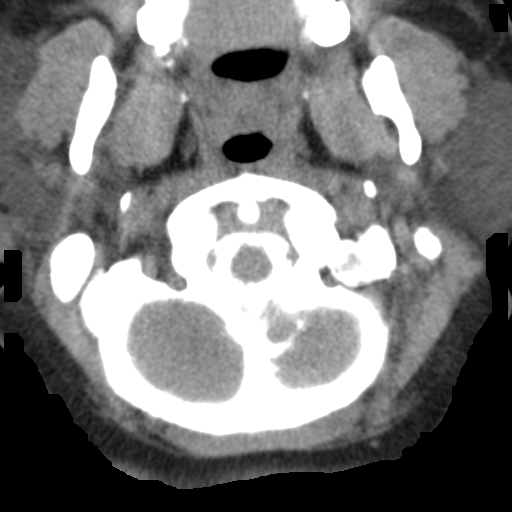

[10 of 14 positions shown; findings below may reference images not displayed]

EXAM:
CERVICAL MYELOGRAM

CT CERVICAL MYELOGRAM

FLUOROSCOPY TIME:  Radiation Exposure Index (as provided by the
fluoroscopic device): 11.5 mGy

Fluoroscopy Time:  55 seconds

Number of Acquired Images:  11

PROCEDURE:
LUMBAR PUNCTURE FOR CERVICAL MYELOGRAM

After thorough discussion of risks and benefits of the procedure
including bleeding, infection, injury to nerves, blood vessels,
adjacent structures as well as headache and CSF leak, written and
oral informed consent was obtained. Consent was obtained by Dr.
SHERIKA. We discussed the high likelihood of obtaining a
diagnostic study.

Patient was positioned prone on the fluoroscopy table. Local
anesthesia was provided with 1% lidocaine without epinephrine after
prepped and draped in the usual sterile fashion. Puncture was
performed at L3-L4 using a 5 inch 22-gauge spinal needle via right
interlaminar approach. Using a single pass through the dura, the
needle was placed within the thecal sac, with return of clear CSF.
10 mL of Isovue [0L] was injected into the thecal sac, with normal
opacification of the nerve roots and cauda equina consistent with
free flow within the subarachnoid space. The patient was then moved
to the trendelenburg position and contrast flowed into the cervical
spine region.

I personally performed the lumbar puncture and administered the
intrathecal contrast. I also personally supervised acquisition of
the myelogram images.
FINDINGS: CERVICAL MYELOGRAM FINDINGS:

Straightening of the normal cervical lordosis. No listhesis or
dynamic instability. Small ventral extradural defects from C2-C3
through C4-C5 and at C6-C7. No high-grade spinal canal stenosis.

CT CERVICAL MYELOGRAM FINDINGS:

Alignment: Straightening of the normal cervical lordosis. No
listhesis.

Skull base and vertebrae: Prior C5-C6 ACDF with partial interbody
fusion. No evidence of hardware failure or loosening. No acute
fracture or other focal pathologic process.

Cord: Normal in bulk and morphology.

Soft tissues and upper chest: Negative.

Disc levels:

C2-C3: Unchanged small central disc protrusion and moderate left
facet arthropathy. No stenosis.

C3-C4: Unchanged small central disc protrusion contacting and
slightly deforming the ventral cord. Unchanged mild left
arthropathy. Unchanged mild left neuroforaminal stenosis. No spinal
canal or right neuroforaminal stenosis.

C4-C5: Unchanged small left paracentral disc protrusion contacting
and slightly deforming the left ventral cord. Unchanged mild left
arthropathy. Unchanged mild left neuroforaminal stenosis. No spinal
canal or right neuroforaminal stenosis.

C5-C6: Prior ACDF. Unchanged mild residual bilateral neuroforaminal
stenosis due to uncovertebral hypertrophy. No spinal canal stenosis.

C6-C7: Unchanged mild disc bulging with superimposed small central
disc protrusion. Unchanged mild spinal canal stenosis. No
neuroforaminal stenosis.

C7-T1: Negative disc. Unchanged moderate left facet arthropathy.
Unchanged mild left neuroforaminal stenosis. No spinal canal or
right neuroforaminal stenosis.
IMPRESSION: 1. Unchanged multilevel cervical spondylosis as described above with
mild spinal canal stenosis at C6-C7.
2. Prior C5-C6 ACDF without hardware complication. Mild residual
neuroforaminal stenosis bilaterally due to uncovertebral
hypertrophy.
3. Unchanged mild left neuroforaminal stenosis at C3-C4, C4-C5, and
C7-T1.

## 2021-04-01 MED ORDER — MEPERIDINE HCL 50 MG/ML IJ SOLN: 50.0000 mg | Freq: Once | INTRAMUSCULAR | Status: DC | PRN

## 2021-04-01 MED ORDER — IOPAMIDOL (ISOVUE-M 300) INJECTION 61%
10.0000 mL | Freq: Once | INTRAMUSCULAR | Status: AC
  Administered 2021-04-01: 10 mL via INTRATHECAL

## 2021-04-01 MED ORDER — ONDANSETRON HCL 4 MG/2ML IJ SOLN: 4.0000 mg | Freq: Once | INTRAMUSCULAR | Status: DC | PRN

## 2021-04-01 MED ORDER — DIAZEPAM 5 MG PO TABS
10.0000 mg | ORAL_TABLET | Freq: Once | ORAL | Status: AC
  Administered 2021-04-01: 5 mg via ORAL

## 2021-04-01 NOTE — Discharge Instructions (Signed)

## 2021-04-02 ENCOUNTER — Encounter: Payer: BC Managed Care – PPO | Admitting: Rehabilitative and Restorative Service Providers"

## 2021-04-04 ENCOUNTER — Other Ambulatory Visit: Payer: Self-pay

## 2021-04-04 ENCOUNTER — Ambulatory Visit (INDEPENDENT_AMBULATORY_CARE_PROVIDER_SITE_OTHER): Payer: BC Managed Care – PPO | Admitting: Rehabilitative and Restorative Service Providers"

## 2021-04-04 DIAGNOSIS — M25512 Pain in left shoulder: Secondary | ICD-10-CM | POA: Diagnosis not present

## 2021-04-04 DIAGNOSIS — R293 Abnormal posture: Secondary | ICD-10-CM | POA: Diagnosis not present

## 2021-04-04 DIAGNOSIS — R600 Localized edema: Secondary | ICD-10-CM | POA: Diagnosis not present

## 2021-04-04 DIAGNOSIS — G8929 Other chronic pain: Secondary | ICD-10-CM

## 2021-04-04 DIAGNOSIS — M6281 Muscle weakness (generalized): Secondary | ICD-10-CM | POA: Diagnosis not present

## 2021-04-04 NOTE — Therapy (Signed)
[facility] [address] [place], [place], [address] Phone: [phone]   Fax:  [phone]  Physical Therapy Treatment  Patient Details  Name: Alexandra Norris MRN: [id number] Date of Birth: Jun 26, 1975 Referring Provider (PT): Dwana Melena PA-C   Encounter Date: 04/04/2021   PT End of Session - 04/04/21 1244     Visit Number 7    Number of Visits 20    Date for PT Re-Evaluation 05/02/21    Authorization Type BCBS    Progress Note Due on Visit 10    PT Start Time 1250    PT Stop Time 1329    PT Time Calculation (min) 39 min    Activity Tolerance Patient tolerated treatment well    Behavior During Therapy [facility] for tasks assessed/performed             Past Medical History:  Diagnosis Date   Anemia    Anxiety    Arthritis    Asthma    Carpal tunnel syndrome    Depression    Dyspnea    Endometriosis    History of blood transfusion 1999   w/vaginal delivery   History of bronchitis    "I've stayed in hospital 2 X w/bronchitis"   Migraine    Pneumonia    PONV (postoperative nausea and vomiting)    Pre-diabetes 10/03/2019   A1C 6.4    Past Surgical History:  Procedure Laterality Date   ABDOMINAL HYSTERECTOMY  10/17/2019   APPENDECTOMY     CARPAL TUNNEL RELEASE Right 07/19/2019   Procedure: RIGHT CARPAL TUNNEL RELEASE;  Surgeon: Leandrew Koyanagi, MD;  Location: [place];  Service: Orthopedics;  Laterality: Right;   CARPAL TUNNEL RELEASE Left 09/13/2019   Procedure: LEFT CARPAL TUNNEL RELEASE;  Surgeon: Leandrew Koyanagi, MD;  Location: [place];  Service: Orthopedics;  Laterality: Left;  Bier block   [place]; 2008   CHOLECYSTECTOMY  2011   SHOULDER ARTHROSCOPY WITH DISTAL CLAVICLE RESECTION  02/12/2021   Procedure: SHOULDER ARTHROSCOPY WITH DISTAL CLAVICLE RESECTION;  Surgeon: Leandrew Koyanagi, MD;  Location: [place];  Service: Orthopedics;;   SHOULDER  ARTHROSCOPY WITH ROTATOR CUFF REPAIR AND SUBACROMIAL DECOMPRESSION Left 02/12/2021   Procedure: LEFT SHOULDER ARTHROSCOPY WITH ROTATOR CUFF REPAIR, SUBACROMIAL DECOMPRESSION, EXTENSIVE DEBRIDEMENT;  Surgeon: Leandrew Koyanagi, MD;  Location: [place];  Service: Orthopedics;  Laterality: Left;   TONSILLECTOMY     tonsilletomy     TUBAL LIGATION  04/2007   VAGINAL HYSTERECTOMY Bilateral 10/17/2019   Procedure: HYSTERECTOMY VAGINAL WITH SALPINGECTOMY;  Surgeon: Chancy Milroy, MD;  Location: [place];  Service: Gynecology;  Laterality: Bilateral;    There were no vitals filed for this visit.   Subjective Assessment - 04/04/21 1255     Subjective Pt. stated feeling much better overall.  Reported pain 3/10 upon arrival.  Pt. indicated no trouble c new exercises.  Did mention she started using her elliptical at home.    Patient is accompained by: Interpreter    Patient Stated Goals Reduce pain    Currently in Pain? Yes    Pain Score 3     Pain Location Shoulder    Pain Orientation Left    Pain Descriptors / Indicators Sore;Aching    Pain Type Surgical pain    Pain Onset More than a month ago    Pain Frequency Intermittent    Aggravating Factors  lifting    Pain Relieving  Factors overall intervention as helped                [facility] PT Assessment - 04/04/21 0001       Assessment   Medical Diagnosis Z98.890 (ICD-10-CM) - S/P arthroscopy of left shoulder    Referring Provider (PT) Dwana Melena PA-C    Onset Date/Surgical Date 02/12/21    Hand Dominance Right      Precautions   Precaution Comments Per MD note from 03/26/2021 - able to perform strengthening progression      AROM   Overall AROM Comments All AROM measured in supine for Lt shoulder    Left Shoulder Flexion 130 Degrees    Left Shoulder ABduction 109 Degrees   pain noted   Left Shoulder Internal Rotation 70 Degrees   in 45 deg abduction   Left Shoulder External Rotation 50 Degrees   in 45 deg abduction                           [facility] Adult PT Treatment/Exercise - 04/04/21 0001       Exercises   Other Exercises  HEP cues for progression, verbal and visual cues given.      Shoulder Exercises: Supine   Flexion Left;AROM   2 x 15 on Lt     Shoulder Exercises: Sidelying   External Rotation Left   1 lb 2 x 10 c towel at side   External Rotation Weight (lbs) 1    ABduction Left   2 x 10     Shoulder Exercises: Standing   Extension Both   2 x 10   Theraband Level (Shoulder Extension) Level 3 (Green)    Row Both   2 x 10   Theraband Level (Shoulder Row) Level 3 (Green)      Shoulder Exercises: ROM/Strengthening   UBE (Upper Arm Bike) Lvl 2.5 3 mins fwd/back each way      Manual Therapy   Manual therapy comments Lt shoulder AP mobilization c movement c ER PROM, g3 inferior mobilizations in scaption/abduction                     PT Education - 04/04/21 1308     Education Details AROM HEP progression.    Person(s) Educated Patient    Methods Explanation;Demonstration;Verbal cues;Handout    Comprehension Verbalized understanding;Returned demonstration              PT Short Term Goals - 03/24/21 0903       PT SHORT TERM GOAL #1   Title Patient will demonstrate independent use of home exercise program to maintain progress from in clinic treatments.    Status Achieved               PT Long Term Goals - 03/24/21 0904       PT LONG TERM GOAL #1   Title Patient will demonstrate/report pain at worst less than or equal to 2/10 to facilitate minimal limitation in daily activity secondary to pain symptoms.    Status On-going      PT LONG TERM GOAL #2   Title Patient will demonstrate independent use of home exercise program to facilitate ability to maintain/progress functional gains from skilled physical therapy services.    Status On-going      PT LONG TERM GOAL #3   Title Patient will demonstrate return to work/recreational activity at  previous level of function without limitations secondary due to  condition    Status On-going      PT LONG TERM GOAL #4   Title Pt. will demonstrate FOTO outcome > or = 63% to indicated reduced disability due to condition.    Status On-going      PT LONG TERM GOAL #5   Title Patient will demonstrate Lt [place] joint mobility WFL to facilitate usual self care, dressing, reaching overhead at PLOF s limitation due to symptoms.    Status On-going      PT LONG TERM GOAL #6   Title Patient will demonstrate Lt UE MMT 4/5 throughout to facilitate usual lifting, carrying in functional activity to PLOF s limitation.    Status On-going                   Plan - 04/04/21 1308     Clinical Impression Statement Continued progression from PROM to [facility] introduction of AROM in gravity reduced positioning.  First measurements of active movement showed restrictions primarily in abduction and external rotation.  Good tolerance to new AROM intervention.    Personal Factors and Comorbidities Comorbidity 3+    Comorbidities Depression, anxiety, history of Carpal tunnel syndrome    Examination-Activity Limitations Sleep;Sit;Bed Mobility;Bathing;Carry;Caring for Others;Dressing;Hygiene/Grooming;Lift;Reach Overhead    Examination-Participation Restrictions Occupation;Meal Prep;Laundry;Interpersonal Relationship;Driving;Community Activity;Cleaning    Stability/Clinical Decision Making Stable/Uncomplicated    Rehab Potential Good    PT Frequency 2x / week    PT Duration Other (comment)    PT Treatment/Interventions ADLs/Self Care Home Management;Cryotherapy;Electrical Stimulation;Iontophoresis 4mg /ml Dexamethasone;Moist Heat;Therapeutic exercise;Therapeutic activities;Functional mobility training;DME Instruction;Ultrasound;Neuromuscular re-education;Patient/family education;Passive range of motion;Spinal Manipulations;Joint Manipulations;Dry needling;Taping;Vasopneumatic Device;Manual techniques    PT Next Visit  Plan supine and sidelying AAROM/AROM transitioning as tolerated.  Avoid shrug    PT Home Exercise Plan [email or web address]    Consulted and Agree with Plan of Care Patient             Patient will benefit from skilled therapeutic intervention in order to improve the following deficits and impairments:  Hypomobility, Increased edema, Decreased activity tolerance, Decreased strength, Impaired UE functional use, Pain, Difficulty walking, Decreased mobility, Decreased range of motion, Impaired perceived functional ability, Improper body mechanics, Postural dysfunction, Impaired flexibility, Decreased coordination  Visit Diagnosis: Chronic left shoulder pain  Muscle weakness (generalized)  Abnormal posture  Localized edema     Problem List Patient Active Problem List   Diagnosis Date Noted   Nontraumatic tear of left supraspinatus tendon 02/12/2021   Impingement syndrome of left shoulder    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   Shingles 11/14/2020   Neck pain 10/14/2020   Encounter to establish care 09/09/2020   Fatty liver disease, nonalcoholic 40/81/4481   Elevated blood-pressure reading, without diagnosis of hypertension 06/17/2020   Influenza vaccine needed 04/03/2020   Cervical disc disorder at C5-C6 level with radiculopathy 04/03/2020   S/P carpal tunnel release 08/30/2019   Right carpal tunnel syndrome 07/11/2019   Left carpal tunnel syndrome 07/11/2019   Arthritis    Anemia    Seasonal allergies 10/08/2016   Nasal obstruction 02/18/2016   DM2 (diabetes mellitus, type 2) ([place]) 10/21/2015   Bilateral chronic knee pain 09/25/2015   GASTROESOPHAGEAL REFLUX DISEASE 08/14/2009   DEPRESSION, SITUATIONAL, PROLONGED 02/05/2009   Body mass index (BMI) 45.0-49.9, adult ([place]) 05/30/2007   Moderate persistent asthma with acute exacerbation 12/06/2006    Scot Jun, PT, DPT, OCS, ATC 04/04/21  1:23 PM    [facility] [address] [place], [place], [address] Phone: [phone]   Fax:  [phone]  Name: Alizia Greif MRN: [id number] Date of Birth: 01-07-1976

## 2021-04-04 NOTE — Patient Instructions (Signed)
Access Code: [email or web address] URL: [email or web address] Date: 04/04/2021 Prepared by: Scot Jun  Exercises Seated Scapular Retraction - 2 x daily - 7 x weekly - 1 sets - 10-15 reps - 5 hold Isometric Shoulder Flexion at Wall - 2 x daily - 5 x weekly - 1 sets - 5 reps - 5 hold Isometric Shoulder Abduction at Wall - 2 x daily - 5 x weekly - 1 sets - 5 reps - 5 hold Isometric Shoulder External Rotation at Wall - 2 x daily - 5 x weekly - 1 sets - 5 reps - 5 hold Standing Isometric Shoulder Internal Rotation at Doorway (Mirrored) - 2 x daily - 5 x weekly - 1 sets - 2 reps - 2 hold Supine Shoulder Flexion Extension AAROM with [place] - 2 x daily - 7 x weekly - 2 sets - 10 reps - 5 hold Supine Shoulder Flexion Extension Full Range AROM (Mirrored) - 2 x daily - 7 x weekly - 3 sets - 10-15 reps Sidelying Shoulder Abduction Palm Forward (Mirrored) - 2 x daily - 7 x weekly - 3 sets - 10-15 reps Sidelying Shoulder External Rotation - 2 x daily - 7 x weekly - 3 sets - 10-15 reps

## 2021-04-07 ENCOUNTER — Other Ambulatory Visit: Payer: Self-pay

## 2021-04-07 ENCOUNTER — Ambulatory Visit (INDEPENDENT_AMBULATORY_CARE_PROVIDER_SITE_OTHER): Payer: BC Managed Care – PPO | Admitting: Rehabilitative and Restorative Service Providers"

## 2021-04-07 DIAGNOSIS — R293 Abnormal posture: Secondary | ICD-10-CM | POA: Diagnosis not present

## 2021-04-07 DIAGNOSIS — M6281 Muscle weakness (generalized): Secondary | ICD-10-CM

## 2021-04-07 DIAGNOSIS — M25512 Pain in left shoulder: Secondary | ICD-10-CM

## 2021-04-07 DIAGNOSIS — R600 Localized edema: Secondary | ICD-10-CM

## 2021-04-07 DIAGNOSIS — G8929 Other chronic pain: Secondary | ICD-10-CM

## 2021-04-07 NOTE — Therapy (Signed)
[facility] [address] [place], [place], [address] Phone: [phone]   Fax:  [phone]  Physical Therapy Treatment  Patient Details  Name: Alexandra Norris MRN: [id number] Date of Birth: January 04, 1976 Referring Provider (PT): Dwana Melena PA-C   Encounter Date: 04/07/2021   PT End of Session - 04/07/21 0848     Visit Number 8    Number of Visits 20    Date for PT Re-Evaluation 05/02/21    Authorization Type BCBS    Progress Note Due on Visit 10    PT Start Time 0848    PT Stop Time 0927    PT Time Calculation (min) 39 min    Activity Tolerance Patient tolerated treatment well    Behavior During Therapy [facility] for tasks assessed/performed             Past Medical History:  Diagnosis Date   Anemia    Anxiety    Arthritis    Asthma    Carpal tunnel syndrome    Depression    Dyspnea    Endometriosis    History of blood transfusion 1999   w/vaginal delivery   History of bronchitis    "I've stayed in hospital 2 X w/bronchitis"   Migraine    Pneumonia    PONV (postoperative nausea and vomiting)    Pre-diabetes 10/03/2019   A1C 6.4    Past Surgical History:  Procedure Laterality Date   ABDOMINAL HYSTERECTOMY  10/17/2019   APPENDECTOMY     CARPAL TUNNEL RELEASE Right 07/19/2019   Procedure: RIGHT CARPAL TUNNEL RELEASE;  Surgeon: Leandrew Koyanagi, MD;  Location: [place];  Service: Orthopedics;  Laterality: Right;   CARPAL TUNNEL RELEASE Left 09/13/2019   Procedure: LEFT CARPAL TUNNEL RELEASE;  Surgeon: Leandrew Koyanagi, MD;  Location: [place];  Service: Orthopedics;  Laterality: Left;  Bier block   [place]; 2008   CHOLECYSTECTOMY  2011   SHOULDER ARTHROSCOPY WITH DISTAL CLAVICLE RESECTION  02/12/2021   Procedure: SHOULDER ARTHROSCOPY WITH DISTAL CLAVICLE RESECTION;  Surgeon: Leandrew Koyanagi, MD;  Location: [place];  Service: Orthopedics;;   SHOULDER  ARTHROSCOPY WITH ROTATOR CUFF REPAIR AND SUBACROMIAL DECOMPRESSION Left 02/12/2021   Procedure: LEFT SHOULDER ARTHROSCOPY WITH ROTATOR CUFF REPAIR, SUBACROMIAL DECOMPRESSION, EXTENSIVE DEBRIDEMENT;  Surgeon: Leandrew Koyanagi, MD;  Location: [place];  Service: Orthopedics;  Laterality: Left;   TONSILLECTOMY     tonsilletomy     TUBAL LIGATION  04/2007   VAGINAL HYSTERECTOMY Bilateral 10/17/2019   Procedure: HYSTERECTOMY VAGINAL WITH SALPINGECTOMY;  Surgeon: Chancy Milroy, MD;  Location: [place];  Service: Gynecology;  Laterality: Bilateral;    There were no vitals filed for this visit.   Subjective Assessment - 04/07/21 0852     Subjective Pt. indicated a little pain but not much.  Pt. stated exercises got a little easier as she practiced them over the weekend.    Patient is accompained by: Interpreter    Patient Stated Goals Reduce pain    Currently in Pain? Yes    Pain Score --   a little (no specific pain number given)   Pain Orientation Left    Pain Descriptors / Indicators Sore    Pain Type Surgical pain    Pain Onset More than a month ago    Pain Frequency Intermittent    Aggravating Factors  exercises    Pain Relieving Factors progressive improvement c exercises  [place] Adult PT Treatment/Exercise - 04/07/21 0001       Shoulder Exercises: Supine   Flexion Left   2 x 20   Shoulder Flexion Weight (lbs) 1      Shoulder Exercises: Seated   Flexion 15 reps   no shrug to 120 degrees     Shoulder Exercises: Sidelying   External Rotation Left   3 x 10   External Rotation Weight (lbs) 1    ABduction Left   2 x 10   ABduction Weight (lbs) 1      Shoulder Exercises: Standing   Extension 20 reps;Both    Theraband Level (Shoulder Extension) Level 3 (Green)    Row Both;20 reps    Theraband Level (Shoulder Row) Level 3 (Green)      Shoulder Exercises: Pulleys   Flexion 2 minutes    Scaption 2 minutes       Shoulder Exercises: ROM/Strengthening   UBE (Upper Arm Bike) Lvl 3 3 mins fwd/back each way      Manual Therapy   Manual therapy comments Lt shoulder mobilization c movement posterior glide c passive ER                       PT Short Term Goals - 03/24/21 7341       PT SHORT TERM GOAL #1   Title Patient will demonstrate independent use of home exercise program to maintain progress from in clinic treatments.    Status Achieved               PT Long Term Goals - 03/24/21 0904       PT LONG TERM GOAL #1   Title Patient will demonstrate/report pain at worst less than or equal to 2/10 to facilitate minimal limitation in daily activity secondary to pain symptoms.    Status On-going      PT LONG TERM GOAL #2   Title Patient will demonstrate independent use of home exercise program to facilitate ability to maintain/progress functional gains from skilled physical therapy services.    Status On-going      PT LONG TERM GOAL #3   Title Patient will demonstrate return to work/recreational activity at previous level of function without limitations secondary due to condition    Status On-going      PT LONG TERM GOAL #4   Title Pt. will demonstrate FOTO outcome > or = 63% to indicated reduced disability due to condition.    Status On-going      PT LONG TERM GOAL #5   Title Patient will demonstrate Lt [place] joint mobility WFL to facilitate usual self care, dressing, reaching overhead at PLOF s limitation due to symptoms.    Status On-going      PT LONG TERM GOAL #6   Title Patient will demonstrate Lt UE MMT 4/5 throughout to facilitate usual lifting, carrying in functional activity to PLOF s limitation.    Status On-going                   Plan - 04/07/21 9379     Clinical Impression Statement Active forward elevation against gravity noted without shrug until 115-120 degrees today.  Continued improvements in active movement to this point.    Personal Factors  and Comorbidities Comorbidity 3+    Comorbidities Depression, anxiety, history of Carpal tunnel syndrome    Examination-Activity Limitations Sleep;Sit;Bed Mobility;Bathing;Carry;Caring for Others;Dressing;Hygiene/Grooming;Lift;Reach Overhead    Examination-Participation Restrictions Occupation;Meal Prep;Laundry;Interpersonal Relationship;Driving;Community Activity;Cleaning  Stability/Clinical Decision Making Stable/Uncomplicated    Rehab Potential Good    PT Frequency 2x / week    PT Duration Other (comment)    PT Treatment/Interventions ADLs/Self Care Home Management;Cryotherapy;Electrical Stimulation;Iontophoresis 4mg /ml Dexamethasone;Moist Heat;Therapeutic exercise;Therapeutic activities;Functional mobility training;DME Instruction;Ultrasound;Neuromuscular re-education;Patient/family education;Passive range of motion;Spinal Manipulations;Joint Manipulations;Dry needling;Taping;Vasopneumatic Device;Manual techniques    PT Next Visit Plan against gravity strengthening as tolerated, resistance band ER    PT Home Exercise Plan [id number]    Consulted and Agree with Plan of Care Patient             Patient will benefit from skilled therapeutic intervention in order to improve the following deficits and impairments:  Hypomobility, Increased edema, Decreased activity tolerance, Decreased strength, Impaired UE functional use, Pain, Difficulty walking, Decreased mobility, Decreased range of motion, Impaired perceived functional ability, Improper body mechanics, Postural dysfunction, Impaired flexibility, Decreased coordination  Visit Diagnosis: Chronic left shoulder pain  Muscle weakness (generalized)  Abnormal posture  Localized edema     Problem List Patient Active Problem List   Diagnosis Date Noted   Nontraumatic tear of left supraspinatus tendon 02/12/2021   Impingement syndrome of left shoulder    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   Shingles 11/14/2020   Neck pain  10/14/2020   Encounter to establish care 09/09/2020   Fatty liver disease, nonalcoholic 16/38/4665   Elevated blood-pressure reading, without diagnosis of hypertension 06/17/2020   Influenza vaccine needed 04/03/2020   Cervical disc disorder at C5-C6 level with radiculopathy 04/03/2020   S/P carpal tunnel release 08/30/2019   Right carpal tunnel syndrome 07/11/2019   Left carpal tunnel syndrome 07/11/2019   Arthritis    Anemia    Seasonal allergies 10/08/2016   Nasal obstruction 02/18/2016   DM2 (diabetes mellitus, type 2) ([place]) 10/21/2015   Bilateral chronic knee pain 09/25/2015   GASTROESOPHAGEAL REFLUX DISEASE 08/14/2009   DEPRESSION, SITUATIONAL, PROLONGED 02/05/2009   Body mass index (BMI) 45.0-49.9, adult (HCC) 05/30/2007   Moderate persistent asthma with acute exacerbation 12/06/2006    Scot Jun, PT, DPT, OCS, ATC 04/07/21  9:27 AM    [facility] [address] [place], [place], [address] Phone: [phone]   Fax:  [phone]  Name: Alexandra Norris MRN: [id number] Date of Birth: 1975/12/02

## 2021-04-09 ENCOUNTER — Encounter: Payer: BC Managed Care – PPO | Admitting: Rehabilitative and Restorative Service Providers"

## 2021-04-14 ENCOUNTER — Encounter: Payer: Self-pay | Admitting: Rehabilitative and Restorative Service Providers"

## 2021-04-14 ENCOUNTER — Other Ambulatory Visit: Payer: Self-pay

## 2021-04-14 ENCOUNTER — Ambulatory Visit (INDEPENDENT_AMBULATORY_CARE_PROVIDER_SITE_OTHER): Payer: BC Managed Care – PPO | Admitting: Rehabilitative and Restorative Service Providers"

## 2021-04-14 DIAGNOSIS — M25512 Pain in left shoulder: Secondary | ICD-10-CM

## 2021-04-14 DIAGNOSIS — R600 Localized edema: Secondary | ICD-10-CM | POA: Diagnosis not present

## 2021-04-14 DIAGNOSIS — M6281 Muscle weakness (generalized): Secondary | ICD-10-CM | POA: Diagnosis not present

## 2021-04-14 DIAGNOSIS — R293 Abnormal posture: Secondary | ICD-10-CM | POA: Diagnosis not present

## 2021-04-14 DIAGNOSIS — G8929 Other chronic pain: Secondary | ICD-10-CM

## 2021-04-14 NOTE — Therapy (Signed)
[facility] [address] [place], [place], [address] Phone: [phone]   Fax:  [phone]  Physical Therapy Treatment  Patient Details  Name: Alexandra Norris MRN: [id number] Date of Birth: 12-30-1975 Referring Provider (PT): Dwana Melena PA-C   Encounter Date: 04/14/2021   PT End of Session - 04/14/21 0840     Visit Number 9    Number of Visits 20    Date for PT Re-Evaluation 05/02/21    Authorization Type BCBS    Progress Note Due on Visit 10    PT Start Time [phone]    PT Stop Time 0920    PT Time Calculation (min) 39 min    Activity Tolerance Patient tolerated treatment well    Behavior During Therapy [facility] for tasks assessed/performed             Past Medical History:  Diagnosis Date   Anemia    Anxiety    Arthritis    Asthma    Carpal tunnel syndrome    Depression    Dyspnea    Endometriosis    History of blood transfusion 1999   w/vaginal delivery   History of bronchitis    "I've stayed in hospital 2 X w/bronchitis"   Migraine    Pneumonia    PONV (postoperative nausea and vomiting)    Pre-diabetes 10/03/2019   A1C 6.4    Past Surgical History:  Procedure Laterality Date   ABDOMINAL HYSTERECTOMY  10/17/2019   APPENDECTOMY     CARPAL TUNNEL RELEASE Right 07/19/2019   Procedure: RIGHT CARPAL TUNNEL RELEASE;  Surgeon: Leandrew Koyanagi, MD;  Location: [place];  Service: Orthopedics;  Laterality: Right;   CARPAL TUNNEL RELEASE Left 09/13/2019   Procedure: LEFT CARPAL TUNNEL RELEASE;  Surgeon: Leandrew Koyanagi, MD;  Location: [place];  Service: Orthopedics;  Laterality: Left;  Bier block   [place]; 2008   CHOLECYSTECTOMY  2011   SHOULDER ARTHROSCOPY WITH DISTAL CLAVICLE RESECTION  02/12/2021   Procedure: SHOULDER ARTHROSCOPY WITH DISTAL CLAVICLE RESECTION;  Surgeon: Leandrew Koyanagi, MD;  Location: [place];  Service: Orthopedics;;   SHOULDER  ARTHROSCOPY WITH ROTATOR CUFF REPAIR AND SUBACROMIAL DECOMPRESSION Left 02/12/2021   Procedure: LEFT SHOULDER ARTHROSCOPY WITH ROTATOR CUFF REPAIR, SUBACROMIAL DECOMPRESSION, EXTENSIVE DEBRIDEMENT;  Surgeon: Leandrew Koyanagi, MD;  Location: [place];  Service: Orthopedics;  Laterality: Left;   TONSILLECTOMY     tonsilletomy     TUBAL LIGATION  04/2007   VAGINAL HYSTERECTOMY Bilateral 10/17/2019   Procedure: HYSTERECTOMY VAGINAL WITH SALPINGECTOMY;  Surgeon: Chancy Milroy, MD;  Location: [place];  Service: Gynecology;  Laterality: Bilateral;    There were no vitals filed for this visit.   Subjective Assessment - 04/14/21 0845     Subjective Pt. indicated around 6/10 pain today after falling onto Lt shoulder on Friday.  Pt. indicated she was doing well prior to fall, sore after fall.  Most symptoms noted in upper arm.    Patient is accompained by: Interpreter    Patient Stated Goals Reduce pain    Pain Score 6     Pain Location Shoulder    Pain Orientation Left    Pain Descriptors / Indicators Sore    Pain Type Surgical pain    Pain Onset More than a month ago    Pain Frequency Constant    Aggravating Factors  after fall    Pain Relieving Factors nothing specific  reported                [facility] PT Assessment - 04/14/21 0001       Assessment   Medical Diagnosis Z98.890 (ICD-10-CM) - S/P arthroscopy of left shoulder    Referring Provider (PT) Dwana Melena PA-C    Onset Date/Surgical Date 02/12/21    Hand Dominance Right      AROM   Overall AROM Comments Flexion against gravity in standing 135 Lt shoulder                           [facility] Adult PT Treatment/Exercise - 04/14/21 0001       Shoulder Exercises: Sidelying   Flexion Left   2 x 10   ABduction Left   3 x 10   ABduction Weight (lbs) 1      Shoulder Exercises: Standing   External Rotation Left   3 x 10 c towel at side   Theraband Level (Shoulder External Rotation) Level 3 (Green)     Internal Rotation Left   3 x 10 towel at side   Theraband Level (Shoulder Internal Rotation) Level 3 (Green)    Flexion Both   0-90 degrees to avoid shrug 2 x 15   Extension Both   3 x 15   Theraband Level (Shoulder Extension) Level 3 (Green)    Row Both   3 x 15   Theraband Level (Shoulder Row) Level 3 (Green)    Other Standing Exercises wall push up c SA press 2 sec hold 2 x 10      Shoulder Exercises: Pulleys   Flexion 2 minutes    Scaption 2 minutes      Shoulder Exercises: ROM/Strengthening   UBE (Upper Arm Bike) Lvl 3 4 mins fwd/back each way                       PT Short Term Goals - 03/24/21 0903       PT SHORT TERM GOAL #1   Title Patient will demonstrate independent use of home exercise program to maintain progress from in clinic treatments.    Status Achieved               PT Long Term Goals - 03/24/21 0904       PT LONG TERM GOAL #1   Title Patient will demonstrate/report pain at worst less than or equal to 2/10 to facilitate minimal limitation in daily activity secondary to pain symptoms.    Status On-going      PT LONG TERM GOAL #2   Title Patient will demonstrate independent use of home exercise program to facilitate ability to maintain/progress functional gains from skilled physical therapy services.    Status On-going      PT LONG TERM GOAL #3   Title Patient will demonstrate return to work/recreational activity at previous level of function without limitations secondary due to condition    Status On-going      PT LONG TERM GOAL #4   Title Pt. will demonstrate FOTO outcome > or = 63% to indicated reduced disability due to condition.    Status On-going      PT LONG TERM GOAL #5   Title Patient will demonstrate Lt [place] joint mobility WFL to facilitate usual self care, dressing, reaching overhead at PLOF s limitation due to symptoms.    Status On-going      PT LONG TERM  GOAL #6   Title Patient will demonstrate Lt UE MMT 4/5  throughout to facilitate usual lifting, carrying in functional activity to PLOF s limitation.    Status On-going                   Plan - 04/14/21 0912     Clinical Impression Statement No ill effects noted in movement patterns after reported fall.  Pt. continued to make progression in active mobility and introduction of against gravity flexion AROM was performed well today.  Plan to continue to monitor symptoms post fall and progress strength as tolerated.    Personal Factors and Comorbidities Comorbidity 3+    Comorbidities Depression, anxiety, history of Carpal tunnel syndrome    Examination-Activity Limitations Sleep;Sit;Bed Mobility;Bathing;Carry;Caring for Others;Dressing;Hygiene/Grooming;Lift;Reach Overhead    Examination-Participation Restrictions Occupation;Meal Prep;Laundry;Interpersonal Relationship;Driving;Community Activity;Cleaning    Stability/Clinical Decision Making Stable/Uncomplicated    Rehab Potential Good    PT Frequency 2x / week    PT Duration Other (comment)    PT Treatment/Interventions ADLs/Self Care Home Management;Cryotherapy;Electrical Stimulation;Iontophoresis 4mg /ml Dexamethasone;Moist Heat;Therapeutic exercise;Therapeutic activities;Functional mobility training;DME Instruction;Ultrasound;Neuromuscular re-education;Patient/family education;Passive range of motion;Spinal Manipulations;Joint Manipulations;Dry needling;Taping;Vasopneumatic Device;Manual techniques    PT Next Visit Plan Continued progressive shoulder strengthening.  LTGs assessment    PT Home Exercise Plan [id number]    Consulted and Agree with Plan of Care Patient             Patient will benefit from skilled therapeutic intervention in order to improve the following deficits and impairments:  Hypomobility, Increased edema, Decreased activity tolerance, Decreased strength, Impaired UE functional use, Pain, Difficulty walking, Decreased mobility, Decreased range of motion, Impaired  perceived functional ability, Improper body mechanics, Postural dysfunction, Impaired flexibility, Decreased coordination  Visit Diagnosis: Chronic left shoulder pain  Muscle weakness (generalized)  Abnormal posture  Localized edema     Problem List Patient Active Problem List   Diagnosis Date Noted   Nontraumatic tear of left supraspinatus tendon 02/12/2021   Impingement syndrome of left shoulder    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   Shingles 11/14/2020   Neck pain 10/14/2020   Encounter to establish care 09/09/2020   Fatty liver disease, nonalcoholic 80/22/3361   Elevated blood-pressure reading, without diagnosis of hypertension 06/17/2020   Influenza vaccine needed 04/03/2020   Cervical disc disorder at C5-C6 level with radiculopathy 04/03/2020   S/P carpal tunnel release 08/30/2019   Right carpal tunnel syndrome 07/11/2019   Left carpal tunnel syndrome 07/11/2019   Arthritis    Anemia    Seasonal allergies 10/08/2016   Nasal obstruction 02/18/2016   DM2 (diabetes mellitus, type 2) ([place]) 10/21/2015   Bilateral chronic knee pain 09/25/2015   GASTROESOPHAGEAL REFLUX DISEASE 08/14/2009   DEPRESSION, SITUATIONAL, PROLONGED 02/05/2009   Body mass index (BMI) 45.0-49.9, adult (HCC) 05/30/2007   Moderate persistent asthma with acute exacerbation 12/06/2006    Scot Jun, PT, DPT, OCS, ATC 04/14/21  9:18 AM    [facility] [address] [place], [place], [address] Phone: [phone]   Fax:  [phone]  Name: Alexandra Norris MRN: [id number] Date of Birth: 1976/03/22

## 2021-04-16 ENCOUNTER — Encounter: Payer: BC Managed Care – PPO | Admitting: Rehabilitative and Restorative Service Providers"

## 2021-04-16 ENCOUNTER — Encounter: Payer: BC Managed Care – PPO | Admitting: Physical Therapy

## 2021-04-16 ENCOUNTER — Telehealth: Payer: Self-pay | Admitting: Physical Therapy

## 2021-04-16 NOTE — Telephone Encounter (Signed)
Called pt in regards to her missed visit. Pt states she was at her son's school for over 2 hours and was not able to make her appointment.  PT reminded her of her next appointment. Pt verbalized understanding.  [facility] April Gordy Levan, PT, DPT 12:02 PM 04/16/2021

## 2021-04-21 ENCOUNTER — Encounter: Payer: BC Managed Care – PPO | Admitting: Rehabilitative and Restorative Service Providers"

## 2021-04-21 ENCOUNTER — Ambulatory Visit
Admission: RE | Admit: 2021-04-21 | Discharge: 2021-04-21 | Disposition: A | Discharge status: Home / Self Care | Payer: Self-pay | Source: Ambulatory Visit | Attending: Orthopaedic Surgery | Admitting: Orthopaedic Surgery

## 2021-04-21 ENCOUNTER — Other Ambulatory Visit: Payer: Self-pay

## 2021-04-21 DIAGNOSIS — G8929 Other chronic pain: Secondary | ICD-10-CM

## 2021-04-21 DIAGNOSIS — M25511 Pain in right shoulder: Secondary | ICD-10-CM

## 2021-04-21 IMAGING — MR MR SHOULDER*R* W/O CM
4 of 5 series · 27 of 40 positions shown · non-contrast
Comparison: X-ray shoulder [DATE].

CLINICAL DATA: Right shoulder pain with limited range of motion x 1
year.

EXAM:
MRI OF THE RIGHT SHOULDER WITHOUT CONTRAST
TECHNIQUE: Multiplanar, multisequence MR imaging of the shoulder was performed.
No intravenous contrast was administered.

[Series 3: T2 fat-sat · axial · 4.0mm · 0.27mm/px · z∈[-67,+77]mm · 9 of 30 slices shown (1 of 3)]
[im 1/30]
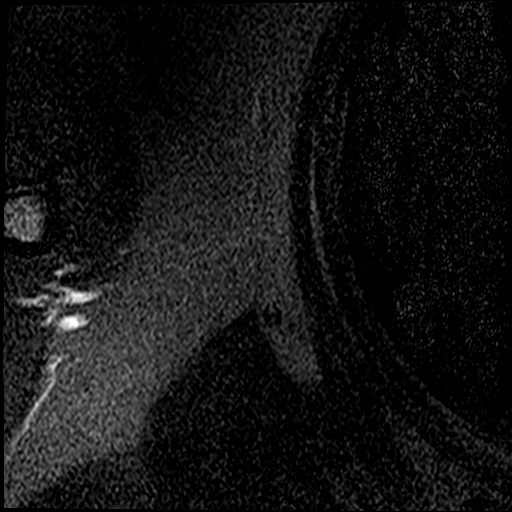
[im 6/30]
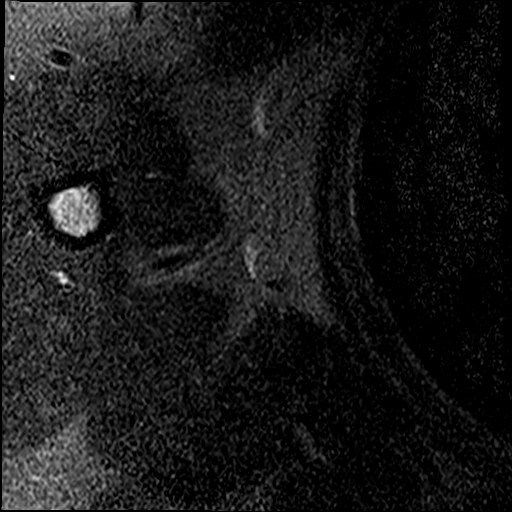
[im 8/30]
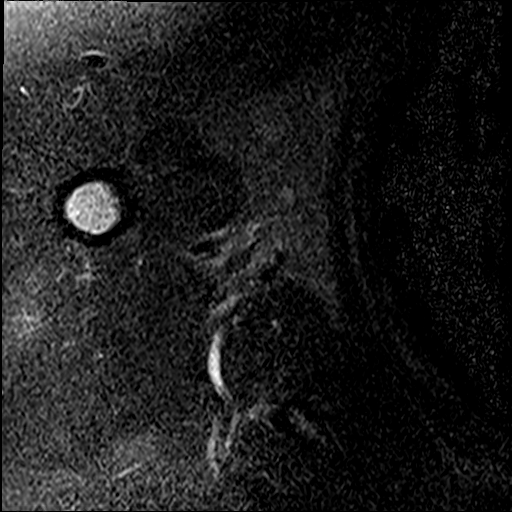
[im 14/30]
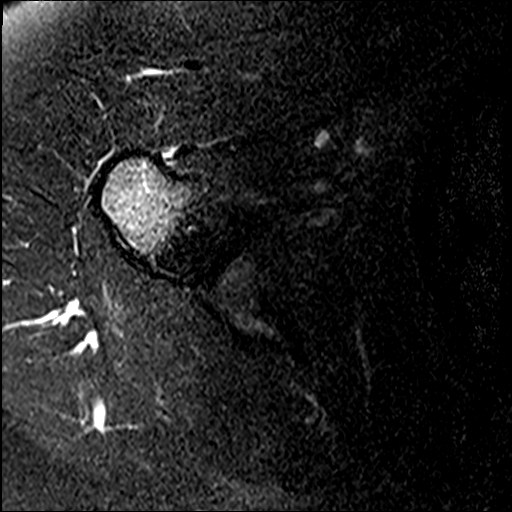
[im 16/30]
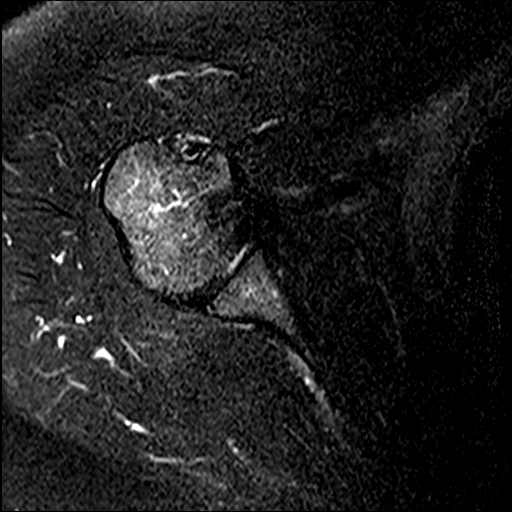
[im 22/30]
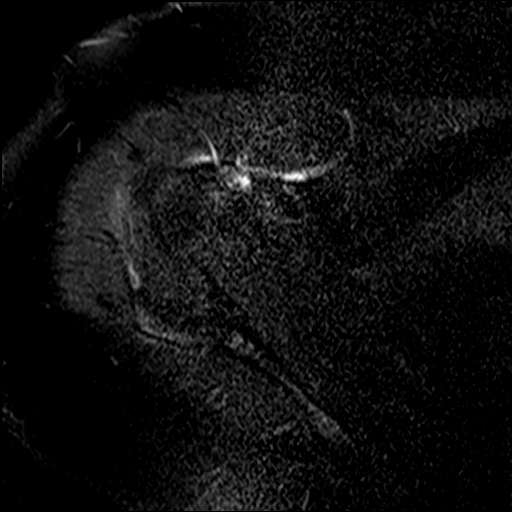
[im 24/30]
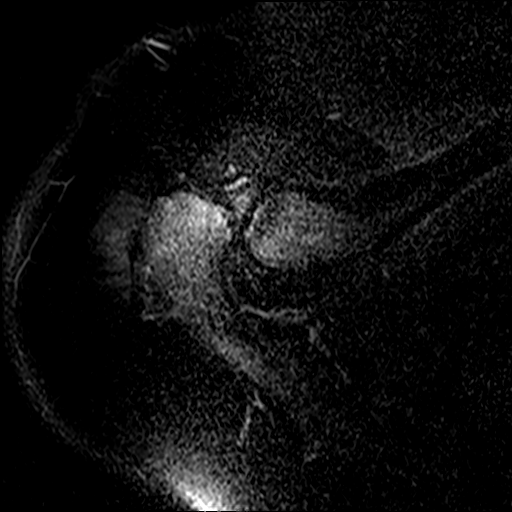
[im 27/30]
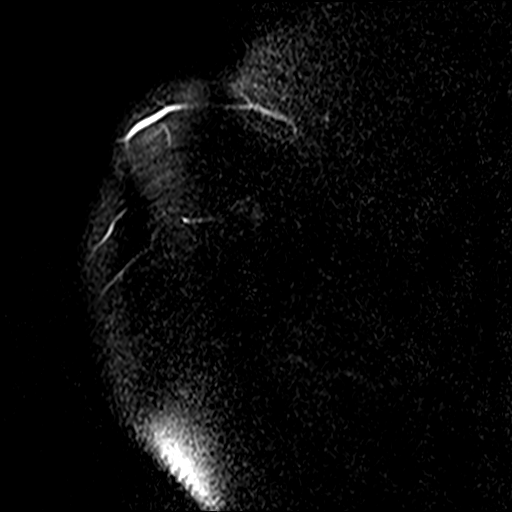
[im 30/30]
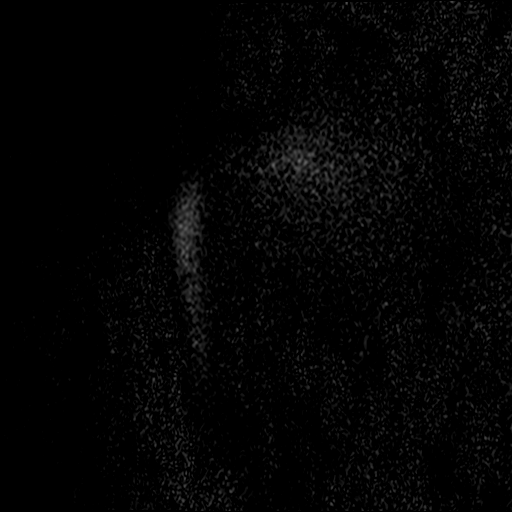

[Series 4: T2 fat-sat · oblique · 4.0mm · 0.55mm/px · 7 of 18 slices shown (2 of 3)]
[im 1/18]
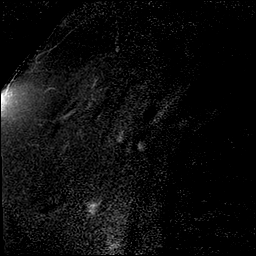
[im 3/18]
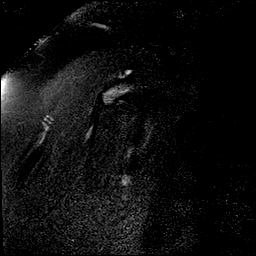
[im 6/18]
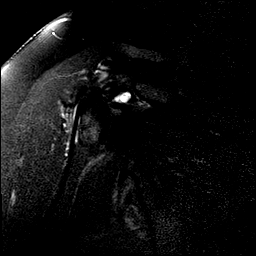
[im 9/18]
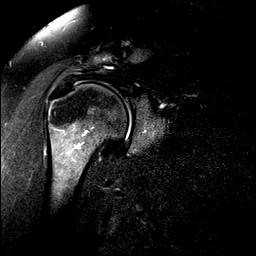
[im 12/18]
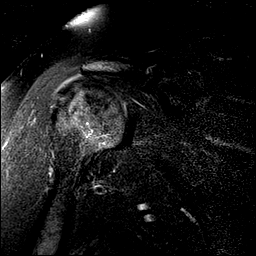
[im 15/18]
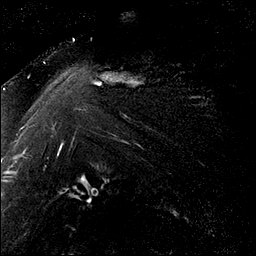
[im 18/18]
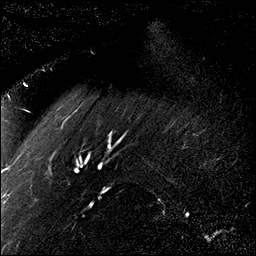

[Series 5: PD · oblique · 4.0mm · 0.27mm/px · 7 of 18 slices shown]
[im 1/18]
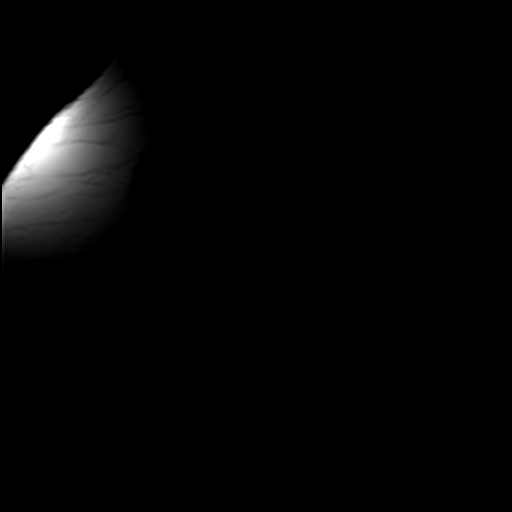
[im 3/18]
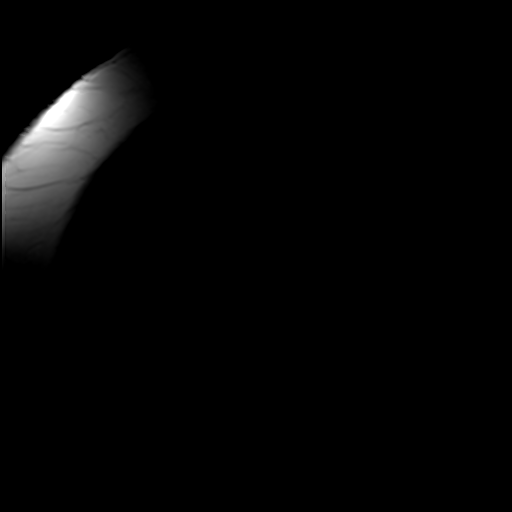
[im 6/18]
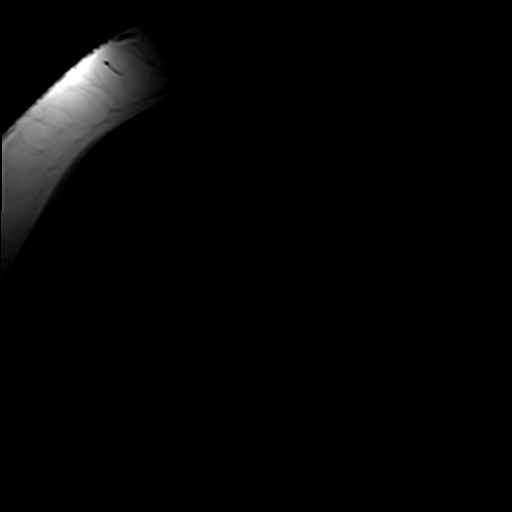
[im 9/18]
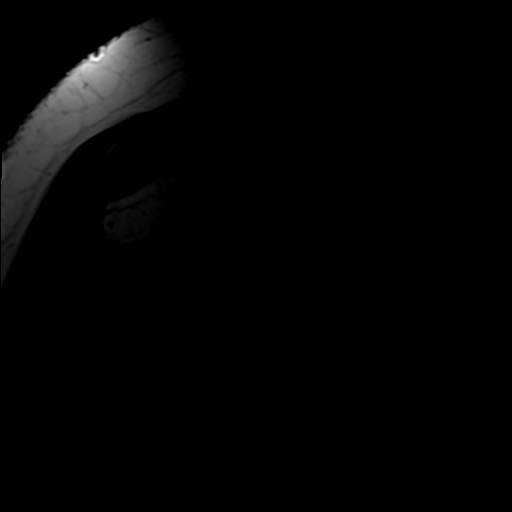
[im 12/18]
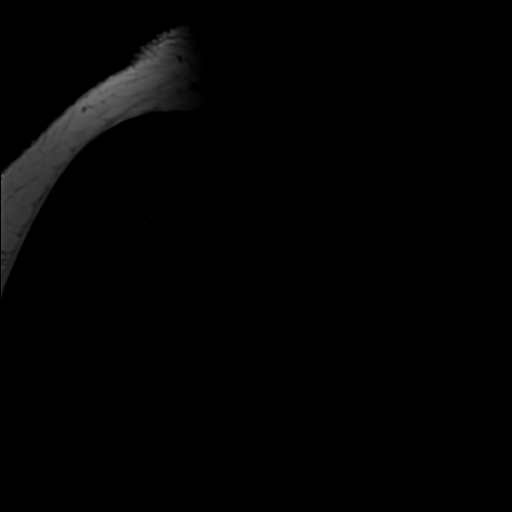
[im 15/18]
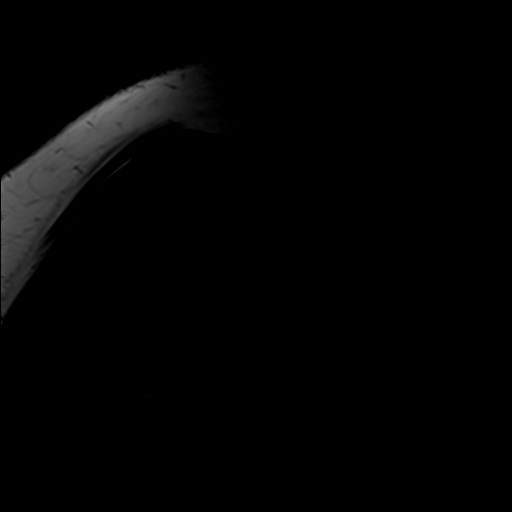
[im 18/18]
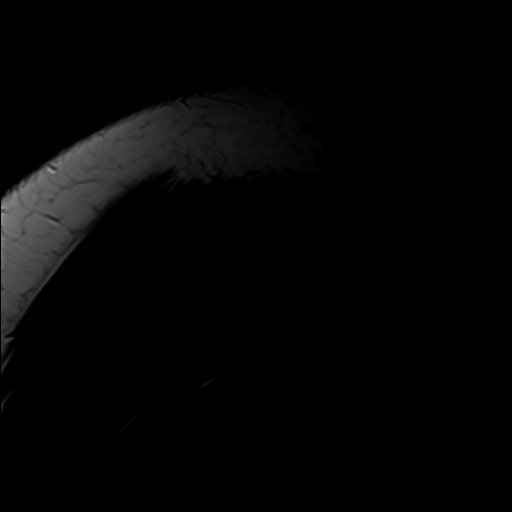

[Series 6: T2 fat-sat · oblique · 4.0mm · 0.55mm/px · 4 of 19 slices shown (3 of 3)]
[im 1/19]
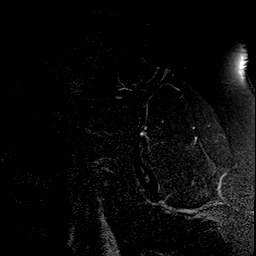
[im 4/19]
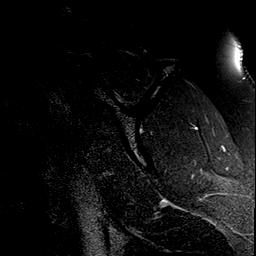
[im 10/19]
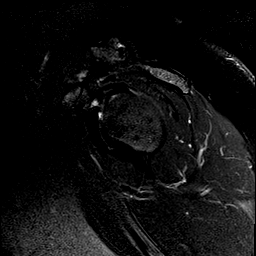
[im 16/19]
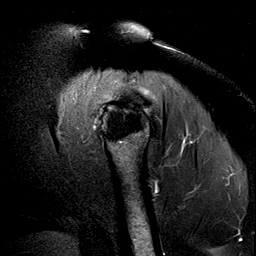

[27 of 40 positions shown; findings below may reference images not displayed]

FINDINGS: Rotator cuff:  Intact without significant tendinosis.

Muscles:  No focal muscular atrophy or edema.

Biceps long head:  Intact and normally positioned.

Acromioclavicular Joint: The acromion is type 1. There are
mild-to-moderate acromioclavicular degenerative changes. No
significant fluid is present in the subacromial - subdeltoid bursa.

Glenohumeral Joint: No significant shoulder joint effusion or
glenohumeral arthropathy.

Labrum: Labral assessment is limited by the lack of joint fluid.
However, there is linear signal in the posterosuperior labrum with a
probable adjacent small paralabral cyst, best seen on the coronal
images and measuring 5 mm on image [DATE]. The inferior labrum appears
intact.

Bones: No acute or significant extra-articular osseous findings.

Other: No significant soft tissue findings.
IMPRESSION: 1. Suspected small SLAP 1 tear with associated adjacent paralabral
cyst.
2. The rotator cuff and biceps tendon appear intact.
3. Mild-to-moderate acromioclavicular degenerative changes.

## 2021-04-22 ENCOUNTER — Other Ambulatory Visit (HOSPITAL_COMMUNITY): Payer: Self-pay

## 2021-04-22 ENCOUNTER — Encounter (HOSPITAL_BASED_OUTPATIENT_CLINIC_OR_DEPARTMENT_OTHER): Payer: Self-pay | Admitting: Nurse Practitioner

## 2021-04-22 ENCOUNTER — Ambulatory Visit (INDEPENDENT_AMBULATORY_CARE_PROVIDER_SITE_OTHER): Payer: BC Managed Care – PPO | Admitting: Nurse Practitioner

## 2021-04-22 ENCOUNTER — Other Ambulatory Visit (HOSPITAL_BASED_OUTPATIENT_CLINIC_OR_DEPARTMENT_OTHER): Payer: Self-pay

## 2021-04-22 ENCOUNTER — Ambulatory Visit: Payer: BLUE CROSS/BLUE SHIELD | Attending: Internal Medicine

## 2021-04-22 VITALS — BP 144/88 | HR 65 | Ht 61.0 in | Wt 240.0 lb

## 2021-04-22 DIAGNOSIS — M25512 Pain in left shoulder: Secondary | ICD-10-CM

## 2021-04-22 DIAGNOSIS — R5383 Other fatigue: Secondary | ICD-10-CM

## 2021-04-22 DIAGNOSIS — Z6841 Body Mass Index (BMI) 40.0 and over, adult: Secondary | ICD-10-CM

## 2021-04-22 DIAGNOSIS — R030 Elevated blood-pressure reading, without diagnosis of hypertension: Secondary | ICD-10-CM

## 2021-04-22 DIAGNOSIS — E1169 Type 2 diabetes mellitus with other specified complication: Secondary | ICD-10-CM

## 2021-04-22 DIAGNOSIS — K760 Fatty (change of) liver, not elsewhere classified: Secondary | ICD-10-CM | POA: Diagnosis not present

## 2021-04-22 DIAGNOSIS — I152 Hypertension secondary to endocrine disorders: Secondary | ICD-10-CM

## 2021-04-22 DIAGNOSIS — E1159 Type 2 diabetes mellitus with other circulatory complications: Secondary | ICD-10-CM

## 2021-04-22 DIAGNOSIS — E785 Hyperlipidemia, unspecified: Secondary | ICD-10-CM

## 2021-04-22 DIAGNOSIS — R42 Dizziness and giddiness: Secondary | ICD-10-CM

## 2021-04-22 DIAGNOSIS — E1165 Type 2 diabetes mellitus with hyperglycemia: Secondary | ICD-10-CM | POA: Diagnosis not present

## 2021-04-22 DIAGNOSIS — Z23 Encounter for immunization: Secondary | ICD-10-CM | POA: Diagnosis not present

## 2021-04-22 DIAGNOSIS — G8929 Other chronic pain: Secondary | ICD-10-CM

## 2021-04-22 DIAGNOSIS — E559 Vitamin D deficiency, unspecified: Secondary | ICD-10-CM

## 2021-04-22 HISTORY — DX: Other fatigue: R53.83

## 2021-04-22 HISTORY — DX: Dizziness and giddiness: R42

## 2021-04-22 LAB — POCT UA - MICROALBUMIN
Albumin/Creatinine Ratio, Urine, POC: <30
Creatinine, POC: 100 mg/dL
Microalbumin Ur, POC: 10 mg/L

## 2021-04-22 MED ORDER — FREESTYLE LIBRE 3 SENSOR MISC
1.0000 [IU] | 11 refills | Status: DC
  Filled 2021-04-22 – 2021-05-07 (×3): qty 2, 28d supply, fill #0

## 2021-04-22 MED ORDER — LISINOPRIL 10 MG PO TABS
10.0000 mg | ORAL_TABLET | Freq: Every day | ORAL | 6 refills | Status: DC
  Filled 2021-04-22: qty 30, 30d supply, fill #0

## 2021-04-22 MED ORDER — ATORVASTATIN CALCIUM 20 MG PO TABS
20.0000 mg | ORAL_TABLET | Freq: Every day | ORAL | 6 refills | Status: DC
  Filled 2021-04-22: qty 30, 30d supply, fill #0

## 2021-04-22 MED ORDER — METFORMIN HCL 500 MG PO TABS
ORAL_TABLET | ORAL | 6 refills | Status: DC
  Filled 2021-04-22 (×2): qty 90, 30d supply, fill #0

## 2021-04-22 MED ORDER — PFIZER COVID-19 VAC BIVALENT 30 MCG/0.3ML IM SUSP
INTRAMUSCULAR | 0 refills | Status: DC
  Filled 2021-04-22: qty 0.3, 1d supply, fill #0

## 2021-04-22 MED ORDER — TRULICITY 1.5 MG/0.5ML ~~LOC~~ SOAJ
1.5000 mg | SUBCUTANEOUS | 0 refills | Status: DC
  Filled 2021-04-22 – 2021-04-30 (×2): qty 2, 28d supply, fill #0

## 2021-04-22 MED ORDER — MELOXICAM 7.5 MG PO TABS
7.5000 mg | ORAL_TABLET | Freq: Every day | ORAL | 3 refills | Status: DC | PRN
  Filled 2021-04-22: qty 60, 30d supply, fill #0

## 2021-04-22 NOTE — Progress Notes (Deleted)
Established Patient Office Visit  Subjective:  Patient ID: Alexandra Norris, female    DOB: Jun 28, 1975  Age: 45 y.o. MRN: [id number]  CC: No chief complaint on file.   HPI Alexandra Norris presents for ***  Past Medical History:  Diagnosis Date   Anemia    Anxiety    Arthritis    Asthma    Carpal tunnel syndrome    Depression    Dyspnea    Endometriosis    History of blood transfusion 1999   w/vaginal delivery   History of bronchitis    "I've stayed in hospital 2 X w/bronchitis"   Migraine    Pneumonia    PONV (postoperative nausea and vomiting)    Pre-diabetes 10/03/2019   A1C 6.4    Past Surgical History:  Procedure Laterality Date   ABDOMINAL HYSTERECTOMY  10/17/2019   APPENDECTOMY     CARPAL TUNNEL RELEASE Right 07/19/2019   Procedure: RIGHT CARPAL TUNNEL RELEASE;  Surgeon: Leandrew Koyanagi, MD;  Location: [facility];  Service: Orthopedics;  Laterality: Right;   CARPAL TUNNEL RELEASE Left 09/13/2019   Procedure: LEFT CARPAL TUNNEL RELEASE;  Surgeon: Leandrew Koyanagi, MD;  Location: [facility];  Service: Orthopedics;  Laterality: Left;  Bier block   [place]; 2008   CHOLECYSTECTOMY  2011   SHOULDER ARTHROSCOPY WITH DISTAL CLAVICLE RESECTION  02/12/2021   Procedure: SHOULDER ARTHROSCOPY WITH DISTAL CLAVICLE RESECTION;  Surgeon: Leandrew Koyanagi, MD;  Location: [facility];  Service: Orthopedics;;   SHOULDER ARTHROSCOPY WITH ROTATOR CUFF REPAIR AND SUBACROMIAL DECOMPRESSION Left 02/12/2021   Procedure: LEFT SHOULDER ARTHROSCOPY WITH ROTATOR CUFF REPAIR, SUBACROMIAL DECOMPRESSION, EXTENSIVE DEBRIDEMENT;  Surgeon: Leandrew Koyanagi, MD;  Location: [facility];  Service: Orthopedics;  Laterality: Left;   TONSILLECTOMY     tonsilletomy     TUBAL LIGATION  04/2007   VAGINAL HYSTERECTOMY Bilateral 10/17/2019   Procedure: HYSTERECTOMY VAGINAL WITH SALPINGECTOMY;  Surgeon: Chancy Milroy, MD;   Location: [facility];  Service: Gynecology;  Laterality: Bilateral;    Family History  Problem Relation Age of Onset   Diabetes Mother    Osteoarthritis Mother    Hypertension Mother    Hyperlipidemia Mother    Arthritis Mother     Social History   Socioeconomic History   Marital status: Married    Spouse name: Not on file   Number of children: 3   Years of education: Not on file   Highest education level: 12th grade  Occupational History   Not on file  Tobacco Use   Smoking status: Never   Smokeless tobacco: Never  Vaping Use   Vaping Use: Never used  Substance and Sexual Activity   Alcohol use: No   Drug use: No   Sexual activity: Yes    Birth control/protection: Surgical  Other Topics Concern   Not on file  Social History Narrative   Not on file   Social Determinants of Health   Financial Resource Strain: Not on file  Food Insecurity: Not on file  Transportation Needs: Not on file  Physical Activity: Not on file  Stress: Not on file  Social Connections: Not on file  Intimate Partner Violence: Not on file    Outpatient Medications Prior to Visit  Medication Sig Dispense Refill   acetaminophen (TYLENOL) 325 MG tablet Take 650 mg by mouth every 6 (six) hours as needed.     albuterol (VENTOLIN HFA) 108 (90  Base) MCG/ACT inhaler Inhale 2 puffs into the lungs in the morning and at bedtime. 18 g 2   atorvastatin (LIPITOR) 20 MG tablet TAKE 1 TABLET (20 MG TOTAL) BY MOUTH DAILY. 30 tablet 6   Blood Glucose Monitoring Suppl (CONTOUR NEXT MONITOR) w/Device KIT USE SEGUN LO INDICADO 3 VECES AL DIA ANTES DE LAS COMIDAS 1 kit 0   Blood Glucose Monitoring Suppl (TRUE METRIX METER) DEVI 1 each by Does not apply route 3 (three) times daily before meals. 1 each 0   cyclobenzaprine (FLEXERIL) 10 MG tablet TOME 1 PASTILLA POR VIA ORAL 3 VECES AL DIA 90 tablet 2   diclofenac (VOLTAREN) 50 MG EC tablet Take 1 tablet (50 mg total) by mouth 2 (two) times daily. 60 tablet 1    doxycycline (VIBRAMYCIN) 100 MG capsule Take 1 capsule (100 mg total) by mouth 2 (two) times daily. 20 capsule 0   gabapentin (NEURONTIN) 100 MG capsule Take 1 tab (162m) up to every 8 hours during the day as needed for pain.  Take 3 tab (3075m at bedtime as needed for pain. 150 capsule 3   glucose blood (TRUE METRIX BLOOD GLUCOSE TEST) test strip Use 3 times daily before meals 100 each 12   glucose blood test strip USE SEGUN LO INDICADO 3 VECES AL DIA ANTES DE LAS COMIDAS (Patient taking differently: USE SEGUN LO INDICADO 3 VECES AL DIA ANTES DE LAS COMIDAS) 100 strip 12   HYDROcodone-acetaminophen (NORCO) 5-325 MG tablet Take 1-2 tablets by mouth 3 (three) times daily as needed. 30 tablet 0   ibuprofen (ADVIL) 800 MG tablet Take 1 tablet (800 mg total) by mouth every 8 (eight) hours as needed. 30 tablet 0   metFORMIN (GLUCOPHAGE) 500 MG tablet TAKE 1 TABLET WITH BREAKFAST AND 2 TABLETS WITH DINNER (Patient taking differently: TAKE 1 TABLET WITH BREAKFAST AND 2 TABLETS WITH DINNER) 60 tablet 6   metFORMIN (GLUCOPHAGE) 500 MG tablet TAKE 1 TABLET (500 MG TOTAL) BY MOUTH 2 (TWO) TIMES DAILY WITH A MEAL. 60 tablet 6   methocarbamol (ROBAXIN) 500 MG tablet Take 1 tablet (500 mg total) by mouth 2 (two) times daily as needed. 20 tablet 0   Microlet Lancets MISC Use to check blood sugar TID. 100 each 2   Microlet Lancets MISC USE TO CHECK BLOOD SUGAR 3 VECES AL DIA 100 each 2   Multiple Vitamin (MULTIVITAMIN WITH MINERALS) TABS tablet Take 1 tablet by mouth daily in the afternoon.     naproxen (NAPROSYN) 500 MG tablet Take 1 tablet (500 mg total) by mouth 2 (two) times daily with a meal. 30 tablet 0   ondansetron (ZOFRAN) 4 MG tablet Take 1 tablet (4 mg total) by mouth every 8 (eight) hours as needed for nausea or vomiting. 40 tablet 0   oxyCODONE-acetaminophen (PERCOCET) 5-325 MG tablet Take 1-2 tablets by mouth every 8 (eight) hours as needed. 40 tablet 0   traMADol-acetaminophen (ULTRACET) 37.5-325 MG  tablet Take 1 tablet by mouth every 6 (six) hours as needed. 30 tablet 0   TRUEplus Lancets 28G MISC USE SEGUN LO INDICADO 3 VECES AL DIA ANTES DE LAS COMIDAS 100 each 12   valACYclovir (VALTREX) 1000 MG tablet Take 1 tablet (1,000 mg total) by mouth 3 (three) times daily for 7 days. 21 tablet 0   No facility-administered medications prior to visit.    No Known Allergies  ROS Review of Systems    Objective:    Physical Exam  LMP 09/20/2019 (Exact  Date)  Wt Readings from Last 3 Encounters:  03/20/21 243 lb (110.2 kg)  02/12/21 242 lb 8.1 oz (110 kg)  01/17/21 239 lb (108.4 kg)     Health Maintenance Due  Topic Date Due   Pneumococcal Vaccine 32-43 Years old (1 - PCV) Never done   FOOT EXAM  Never done   COVID-19 Vaccine (3 - Booster for Pfizer series) 07/23/2020   INFLUENZA VACCINE  01/06/2021   OPHTHALMOLOGY EXAM  01/09/2021   HEMOGLOBIN A1C  02/07/2021    There are no preventive care reminders to display for this patient.  Lab Results  Component Value Date   TSH 2.685 07/07/2014   Lab Results  Component Value Date   WBC 16.5 (H) 10/18/2019   HGB 11.4 (L) 10/18/2019   HCT 35.8 (L) 10/18/2019   MCV 80.8 10/18/2019   PLT 277 10/18/2019   Lab Results  Component Value Date   NA 135 02/04/2021   K 4.0 02/04/2021   CO2 24 02/04/2021   GLUCOSE 214 (H) 02/04/2021   BUN 9 02/04/2021   CREATININE 0.54 02/04/2021   BILITOT 0.5 08/07/2020   ALKPHOS 138 (H) 08/07/2020   AST 98 (H) 08/07/2020   ALT 120 (H) 08/07/2020   PROT 7.9 08/07/2020   ALBUMIN 4.6 08/07/2020   CALCIUM 8.9 02/04/2021   ANIONGAP 7 02/04/2021   EGFR 112 08/07/2020   Lab Results  Component Value Date   CHOL 169 10/03/2019   Lab Results  Component Value Date   HDL 52 10/03/2019   Lab Results  Component Value Date   LDLCALC 103 (H) 10/03/2019   Lab Results  Component Value Date   TRIG 71 10/03/2019   Lab Results  Component Value Date   CHOLHDL 3.3 10/03/2019   Lab Results   Component Value Date   HGBA1C 7.9 (H) 08/07/2020      Assessment & Plan:   Problem List Items Addressed This Visit   None   No orders of the defined types were placed in this encounter.   Follow-up: No follow-ups on file.    Orma Render, NP

## 2021-04-22 NOTE — Progress Notes (Signed)
   Covid-19 Vaccination Clinic  Name:  Synia Douglass    MRN: [id number] DOB: 07/13/1975  04/22/2021  [name] was observed post Covid-19 immunization for 15 minutes without incident. She was provided with Vaccine Information Sheet and instruction to access the V-Safe system.   [name] was instructed to call 911 with any severe reactions post vaccine: Difficulty breathing  Swelling of face and throat  A fast heartbeat  A bad rash all over body  Dizziness and weakness   Immunizations Administered     Name Date Dose VIS Date Route   Pfizer Covid-19 Vaccine Bivalent Booster 04/22/2021  3:24 PM 0.3 mL 02/05/2021 Intramuscular   Manufacturer: [place]   Lot: [id number]   [place]: [phone]

## 2021-04-22 NOTE — Assessment & Plan Note (Addendum)
Current symptoms of hyperglycemia are present. She stopped taking her metformin two months ago.  It is unclear if she has been following a low carbohydrate diet or exercising regularly.  Suspicious of increased hemoglobin A1C.  Discussed measured to help control her diabetes.  With shared decision making, patient agreeable to start Trulicity 0.75mg , once a week. Samples given with one month samples and one month free card to help with her prescription. Educated the patient on the possible side effects, how to administer medication, dietary changes, and common side effects to watch out for. Restart Metformin twice daily with meals.  Free style libre 3 sample applied on the patient's right arm.  Education provided on monitoring and assistance provided for app downloaded on the patient's phone.  Discussed goals of monitoring blood sugar and blood sugar control.  Patient educated on symptoms of hypoglycemic events and advised to contact the office if there are any adverse effects or if she starts to feel worse.  Will check CMP, Urine ACR and hemoglobin A1C.  Recommend goal blood sugars to be no greater than 180 two hours postprandial.  We will likely need to continue this form of monitoring to obtain control and help with education on foods that are appropriate. Patient was quite emotional during evaluation and education however she was reassured that together we will work on getting her blood sugars and other conditions well under control. We will follow-up in 4 weeks.

## 2021-04-22 NOTE — Assessment & Plan Note (Addendum)
Patient stopped taking her Atorvastatin two months ago along with her diabetes medication.  It appears as though she ran out of medication as she did miss her follow-up visit. Will check a Lipid panel today and restart Atorvastatin.  Discussed with patient the importance of medication adherence and lab monitoring for evaluation of lipids and liver function.. Advised patient about side effects of the medications and to let us know if her symptoms worsen. We will plan to follow-up in 4 weeks.

## 2021-04-22 NOTE — Progress Notes (Signed)
Established Patient Office Visit  Subjective:  Patient ID: Alexandra Norris, female    DOB: [birth year]  Age: 45 y.o. MRN: [id number]  CC:  Chief Complaint  Patient presents with   Follow-up    Patient comes in today with concerns to check sugar and liver. Her husband is with her to translate. He stated she has not been feeling well x 1week., real tired and dizzy. She would also like a RX for ibpropfen for left shoulder. She has surg in 02/12/21. She would also like flu shot while she is here today. As well as a COVID shot. I informed her that she could go downstairs to the pharmacy to receive the Covid shot.     HPI Alexandra Norris for follow-up of diabetes and fatty liver.  She endorses some concerning symptoms that started about a week and a half ago.  She endorses dizziness and fatigue.  She also endorses increased thirst, dry mouth, urinary frequency, numbness and tingling in the extremities.  She has also been experiencing some headaches and blurry vision.  She has been seen by the eye doctor recently however has an appointment scheduled to follow back up given the new onset of blurry vision.   She tells me that she has not been taking her medication for the past 2 months.  She has not been checking her blood sugars at home either.  She tells me that she ran out of her medication and missed her follow-up appointment in July.  Left Shoulder pain: She had left shoulder surgery in September. She has been taking Ibuprofen with some relief. She has an appointment with the orthopedic later this week for reevaluation and additional evaluation on the right shoulder.  She tells me she is in pain and she would like to know if she can get a refill on the ibuprofen.    ROS Review of Systems  Constitutional:  Positive for fatigue.  Endocrine: Positive for polydipsia and polyuria.  Neurological:  Positive for dizziness, numbness and headaches.     Objective:    Physical  Exam Vitals and nursing note reviewed.  Constitutional:      Appearance: Normal appearance.  Cardiovascular:     Rate and Rhythm: Normal rate and regular rhythm.     Pulses: Normal pulses.     Heart sounds: Normal heart sounds.  Pulmonary:     Effort: Pulmonary effort is normal.     Breath sounds: Normal breath sounds.  Skin:    General: Skin is warm.     Capillary Refill: Capillary refill takes less than 2 seconds.  Neurological:     Mental Status: She is alert and oriented to person, place, and time.  Psychiatric:        Behavior: Behavior normal.     Comments: Emotional    BP (!) 144/88   Pulse 65   Ht 5\' 1"  (1.549 m)   Wt 240 lb (108.9 kg)   LMP 09/20/2019 (Exact Date)   SpO2 99%   BMI 45.35 kg/m  Wt Readings from Last 3 Encounters:  04/22/21 240 lb (108.9 kg)  03/20/21 243 lb (110.2 kg)  02/12/21 242 lb 8.1 oz (110 kg)      Assessment & Plan:   Problem List Items Addressed This Visit     Body mass index (BMI) 45.0-49.9, adult (HCC)    BMI 45.35 today. At this time diabetes is likely out of control as she has been off of her medication for about  2 months. Discussion on diet and medication management to help with diabetes, hyperlipidemia, hypertension, and weight loss in addition to fatty liver. We will begin GLP-1 treatment with Trulicity in addition to metformin restart today. Patient provided with sample of freestyle libre 3 with prescription to help with blood glucose monitoring and ensure consistency in readings as well as real-time education on impact certain foods have on her diabetes. We will plan to obtain labs today. Will follow-up in approximately 4 weeks. Patient will likely need additional education and interventions to help achieve optimal control.      Relevant Medications   metFORMIN (GLUCOPHAGE) 500 MG tablet   Dulaglutide (TRULICITY) 1.5 TK/1.6WF SOPN   DM2 (diabetes mellitus, type 2) ([place]) - Primary    Current symptoms of hyperglycemia are  present. She stopped taking her metformin two months ago.  It is unclear if she has been following a low carbohydrate diet or exercising regularly.  Suspicious of increased hemoglobin A1C.  Discussed measured to help control her diabetes.  With shared decision making, patient agreeable to start Trulicity 0.75mg , once a week. Samples given with one month samples and one month free card to help with her prescription. Educated the patient on the possible side effects, how to administer medication, dietary changes, and common side effects to watch out for. Restart Metformin twice daily with meals.  Free style libre 3 sample applied on the patient's right arm.  Education provided on monitoring and assistance provided for app downloaded on the patient's phone.  Discussed goals of monitoring blood sugar and blood sugar control.  Patient educated on symptoms of hypoglycemic events and advised to contact the office if there are any adverse effects or if she starts to feel worse.  Will check CMP, Urine ACR and hemoglobin A1C.  Recommend goal blood sugars to be no greater than 180 two hours postprandial.  We will likely need to continue this form of monitoring to obtain control and help with education on foods that are appropriate. Patient was quite emotional during evaluation and education however she was reassured that together we will work on getting her blood sugars and other conditions well under control. We will follow-up in 4 weeks.      Relevant Medications   atorvastatin (LIPITOR) 20 MG tablet   Continuous Blood Gluc Sensor (FREESTYLE LIBRE 3 SENSOR) MISC   metFORMIN (GLUCOPHAGE) 500 MG tablet   Dulaglutide (TRULICITY) 1.5 UX/3.2TF SOPN   lisinopril (ZESTRIL) 10 MG tablet   Other Relevant Orders   CBC with Differential/Platelet   Comprehensive metabolic panel   Lipid panel   VITAMIN D 25 Hydroxy (Vit-D Deficiency, Fractures)   Hemoglobin A1c   POCT UA - Microalbumin (Completed)   Vitamin D  deficiency    Reports fatigue and headaches. Will recheck Vitamin D level today to rule out any concern.       Relevant Medications   Continuous Blood Gluc Sensor (FREESTYLE LIBRE 3 SENSOR) MISC   metFORMIN (GLUCOPHAGE) 500 MG tablet   Dulaglutide (TRULICITY) 1.5 TD/3.2KG SOPN   Other Relevant Orders   VITAMIN D 25 Hydroxy (Vit-D Deficiency, Fractures)   RESOLVED: Elevated blood-pressure reading, without diagnosis of hypertension   Fatty liver disease, nonalcoholic    Elevated LFTs with confirmed ultrasound revealing fatty liver historically. Unfortunately she missed her appointment in July and has not had follow-up with labs since her initial evaluation in April of this year. Will obtain labs today for close monitoring. Recommended restarting diabetes medication and working towards weight loss.  Started on GLP1 today in addition to metformin.  Will plan for follow-up in 4 weeks with close evaluation.       Relevant Medications   atorvastatin (LIPITOR) 20 MG tablet   Continuous Blood Gluc Sensor (FREESTYLE LIBRE 3 SENSOR) MISC   metFORMIN (GLUCOPHAGE) 500 MG tablet   Dulaglutide (TRULICITY) 1.5 GM/0.1UU SOPN   lisinopril (ZESTRIL) 10 MG tablet   Other Relevant Orders   CBC with Differential/Platelet   Comprehensive metabolic panel   Lipid panel   VITAMIN D 25 Hydroxy (Vit-D Deficiency, Fractures)   Hemoglobin A1c   POCT UA - Microalbumin (Completed)   Chronic left shoulder pain    Surgery in September. Has a follow up schedule with the orthopedic.  Historically has had excellent pain relief with NSAID treatment. Will send in Meloxicam as it will give more relief with less required dosing and lower GI and kidney side effects..       Relevant Medications   meloxicam (MOBIC) 7.5 MG tablet   Continuous Blood Gluc Sensor (FREESTYLE LIBRE 3 SENSOR) MISC   metFORMIN (GLUCOPHAGE) 500 MG tablet   Dulaglutide (TRULICITY) 1.5 VO/5.3GU SOPN   Hypertension associated with diabetes ([place])     Blood pressure elevated today.  She is not checking her blood pressures at home however is having daily headaches. No alarm symptoms present today. We will plan to begin lisinopril today for kidney protection and blood pressure management in the setting of diabetes. Will check labs with CMP and CBC today. Recommend follow-up in 4 weeks for reevaluation. Patient will contact the office if she begins to have any concerning symptoms.       Relevant Medications   atorvastatin (LIPITOR) 20 MG tablet   Continuous Blood Gluc Sensor (FREESTYLE LIBRE 3 SENSOR) MISC   metFORMIN (GLUCOPHAGE) 500 MG tablet   Dulaglutide (TRULICITY) 1.5 YQ/0.3KV SOPN   lisinopril (ZESTRIL) 10 MG tablet   Hyperlipidemia associated with type 2 diabetes mellitus ([place])    Patient stopped taking her Atorvastatin two months ago along with her diabetes medication.  It appears as though she ran out of medication as she did miss her follow-up visit. Will check a Lipid panel today and restart Atorvastatin.  Discussed with patient the importance of medication adherence and lab monitoring for evaluation of lipids and liver function.. Advised patient about side effects of the medications and to let us know if her symptoms worsen. We will plan to follow-up in 4 weeks.      Relevant Medications   atorvastatin (LIPITOR) 20 MG tablet   Continuous Blood Gluc Sensor (FREESTYLE LIBRE 3 SENSOR) MISC   metFORMIN (GLUCOPHAGE) 500 MG tablet   Dulaglutide (TRULICITY) 1.5 QQ/5.9DG SOPN   lisinopril (ZESTRIL) 10 MG tablet   Dizziness    Dizziness in the setting of uncontrolled diabetes and hypertension. Patient is not currently taking any medications for this. No red flags present today.  Denies any unusual bleeding.  Feel strongly this is related to uncontrolled blood sugars and elevated blood pressure however we will check labs today to rule out other causes.  We will plan to follow-up in 4 weeks or sooner if needed.      Fatigue     Increased fatigue in the setting of uncontrolled diabetes, uncontrolled hypertension, hyperlipidemia, fatty liver disease, and obesity. Unfortunately the patient has been off of her medications for approximately 2 months and has not had appropriate follow-up. We will restart previous medications and add continuous blood glucose monitor with freestyle libre 3 as  well as GLP-1 medication Trulicity to help with blood sugar control. Labs will be obtained today for further evaluation. Plan to follow-up in 4 weeks or sooner if needed.      Other Visit Diagnoses     Need for influenza vaccination       Relevant Orders   Flu Vaccine QUAD 6+ mos PF IM (Fluarix Quad PF) (Completed)       Meds ordered this encounter  Medications   meloxicam (MOBIC) 7.5 MG tablet    Sig: Take 1-2 tablets (7.5-15 mg total) by mouth daily as needed for shoudler pain. Max of 2 daily. [place] 1-2 tabletas cada dia para el dolor del hombro. No mas de 2 tabletas cada 24 horas    Dispense:  60 tablet    Refill:  3    Please translate into Spanish for patient.   atorvastatin (LIPITOR) 20 MG tablet    Sig: Take 1 tablet (20 mg total) by mouth at bedtime.    Dispense:  30 tablet    Refill:  6   Continuous Blood Gluc Sensor (FREESTYLE LIBRE 3 SENSOR) MISC    Sig: Place 1 sensor on the skin every 14 days. Use to check glucose continuously    Dispense:  2 each    Refill:  11   metFORMIN (GLUCOPHAGE) 500 MG tablet    Sig: Take 1 tablet (500 mg total) by mouth daily with breakfast AND 2 tablets (1,000 mg total) with dinner    Dispense:  90 tablet    Refill:  6   Dulaglutide (TRULICITY) 1.5 GG/8.3MO SOPN    Sig: Inject 1.5 mg into the skin once a week. Do not start until finished with the 0.75mg  dose for 4 weeks.    Dispense:  2 mL    Refill:  0   lisinopril (ZESTRIL) 10 MG tablet    Sig: Take 1 tablet (10 mg total) by mouth daily.    Dispense:  30 tablet    Refill:  6    Follow-up: Return in about 4 weeks (around  05/20/2021) for Diabetes.   Time: 70 minutes, >50% spent counseling, care coordination, chart review, and documentation.     Tinnie Gens, Toney Reil, DNP STUDENT    Patient seen along with NP student, Tinnie Gens, RN at today's visit. Portions of documentation and assessment have been completed by the student listed.  Porfirio Oar, NP, have reviewed all documentation completed by the student. The documentation on 04/22/21 for the exam, diagnosis, procedures, and orders are all accurate and complete.  Porfirio Oar, NP,have personally seen and evaluated the patient during this encounter.  While the patient was in clinic, I reviewed the patient's medical history, the student's findings on physical examination, and the patient's diagnosis and treatment plan. All aspects of care were discussed with the the student and I agree with the information documented.   Kona Lover, Coralee Pesa, NP, DNp, AGNP-c Primary [facility] at [place], [facility]

## 2021-04-22 NOTE — Assessment & Plan Note (Signed)
BMI 45.35 today. At this time diabetes is likely out of control as she has been off of her medication for about 2 months. Discussion on diet and medication management to help with diabetes, hyperlipidemia, hypertension, and weight loss in addition to fatty liver. We will begin GLP-1 treatment with Trulicity in addition to metformin restart today. Patient provided with sample of freestyle libre 3 with prescription to help with blood glucose monitoring and ensure consistency in readings as well as real-time education on impact certain foods have on her diabetes. We will plan to obtain labs today. Will follow-up in approximately 4 weeks. Patient will likely need additional education and interventions to help achieve optimal control.

## 2021-04-22 NOTE — Assessment & Plan Note (Addendum)
Elevated LFTs with confirmed ultrasound revealing fatty liver historically. Unfortunately she missed her appointment in July and has not had follow-up with labs since her initial evaluation in April of this year. Will obtain labs today for close monitoring. Recommended restarting diabetes medication and working towards weight loss. Started on GLP1 today in addition to metformin.  Will plan for follow-up in 4 weeks with close evaluation.

## 2021-04-22 NOTE — Assessment & Plan Note (Deleted)
Blood pressure elevated today. Patient reports headaches along with uncontrolled diabetes. Will start on Lisinopril today. Will check CMP and CBC today. Recommend follow up in four weeks.

## 2021-04-22 NOTE — Assessment & Plan Note (Addendum)
Blood pressure elevated today.  She is not checking her blood pressures at home however is having daily headaches. No alarm symptoms present today. We will plan to begin lisinopril today for kidney protection and blood pressure management in the setting of diabetes. Will check labs with CMP and CBC today. Recommend follow-up in 4 weeks for reevaluation. Patient will contact the office if she begins to have any concerning symptoms.

## 2021-04-22 NOTE — Patient Instructions (Addendum)
Le he enviado un medicamento llamado Meloxicam para su dolor de hombro. Es como el ibuprofeno pero solo se toma una pastilla al da y dura todo Games developer. Esto es ms fcil para el estmago y los riones.  Le obtendremos anlisis de laboratorio hoy para controlar sus niveles de hgado, riones, azcar en la sangre, colesterol y vitamina D.  Voy a comenzar con un nuevo medicamento para sus niveles de Ecologist Trulicity. Esta es una inyeccin una vez a la semana. Esto har que te sientas lleno por ms tiempo. Cuando coma, comience con porciones pequeas y detngase cuando est lleno o se enfermar del estmago. Alterne el lugar donde pone la inyeccin cada semana. Puede inyectar su muslo o su abdomen. Trate de [employer] a 2 pulgadas del ombligo cuando se inyecte en el estmago.  Le he dado Truddie Coco de un monitor de azcar en la sangre para que lo use durante [address] Esto se leer continuamente en la aplicacin de su telfono. Despus de [address], retire Nature conservation officer y reemplcelo por uno nuevo. Te lo he enviado a Engineer, building services. Pngalo en el brazo opuesto la prxima vez y gire los brazos cada vez.  He enviado metformina para la diabetes. Tome una tableta cada maana con el desayuno y [place].  He enviado atorvastatina para su colesterol. Tome una tableta todas las noches antes de [place].  Tambin he enviado un nuevo medicamento para su presin arterial. Se est agotando y 58 puede ser peligroso con la diabetes. El medicamento se llama lisinopril.  Me gustara verlo de regreso en 4 semanas para ver cmo est y asegurarme de que sus sntomas mejoren.

## 2021-04-22 NOTE — Assessment & Plan Note (Addendum)
Surgery in September. Has a follow up schedule with the orthopedic.  Historically has had excellent pain relief with NSAID treatment. Will send in Meloxicam as it will give more relief with less required dosing and lower GI and kidney side effects.Alexandra Norris

## 2021-04-22 NOTE — Assessment & Plan Note (Signed)
Reports fatigue and headaches. Will recheck Vitamin D level today to rule out any concern.

## 2021-04-22 NOTE — Assessment & Plan Note (Signed)
Increased fatigue in the setting of uncontrolled diabetes, uncontrolled hypertension, hyperlipidemia, fatty liver disease, and obesity. Unfortunately the patient has been off of her medications for approximately 2 months and has not had appropriate follow-up. We will restart previous medications and add continuous blood glucose monitor with freestyle libre 3 as well as GLP-1 medication Trulicity to help with blood sugar control. Labs will be obtained today for further evaluation. Plan to follow-up in 4 weeks or sooner if needed.

## 2021-04-22 NOTE — Assessment & Plan Note (Addendum)
Dizziness in the setting of uncontrolled diabetes and hypertension. Patient is not currently taking any medications for this. No red flags present today.  Denies any unusual bleeding.  Feel strongly this is related to uncontrolled blood sugars and elevated blood pressure however we will check labs today to rule out other causes.  We will plan to follow-up in 4 weeks or sooner if needed.

## 2021-04-23 ENCOUNTER — Encounter: Payer: Self-pay | Admitting: Rehabilitative and Restorative Service Providers"

## 2021-04-23 ENCOUNTER — Other Ambulatory Visit: Payer: Self-pay

## 2021-04-23 ENCOUNTER — Ambulatory Visit (INDEPENDENT_AMBULATORY_CARE_PROVIDER_SITE_OTHER): Payer: BLUE CROSS/BLUE SHIELD | Admitting: Rehabilitative and Restorative Service Providers"

## 2021-04-23 ENCOUNTER — Ambulatory Visit (INDEPENDENT_AMBULATORY_CARE_PROVIDER_SITE_OTHER): Payer: BLUE CROSS/BLUE SHIELD | Admitting: Specialist

## 2021-04-23 ENCOUNTER — Encounter: Payer: Self-pay | Admitting: Specialist

## 2021-04-23 VITALS — BP 110/69 | HR 110 | Ht 61.0 in | Wt 240.0 lb

## 2021-04-23 DIAGNOSIS — R293 Abnormal posture: Secondary | ICD-10-CM

## 2021-04-23 DIAGNOSIS — M25512 Pain in left shoulder: Secondary | ICD-10-CM | POA: Diagnosis not present

## 2021-04-23 DIAGNOSIS — R600 Localized edema: Secondary | ICD-10-CM

## 2021-04-23 DIAGNOSIS — G5621 Lesion of ulnar nerve, right upper limb: Secondary | ICD-10-CM

## 2021-04-23 DIAGNOSIS — M47812 Spondylosis without myelopathy or radiculopathy, cervical region: Secondary | ICD-10-CM

## 2021-04-23 DIAGNOSIS — M6281 Muscle weakness (generalized): Secondary | ICD-10-CM | POA: Diagnosis not present

## 2021-04-23 DIAGNOSIS — E1142 Type 2 diabetes mellitus with diabetic polyneuropathy: Secondary | ICD-10-CM

## 2021-04-23 DIAGNOSIS — G8929 Other chronic pain: Secondary | ICD-10-CM

## 2021-04-23 LAB — CBC WITH DIFFERENTIAL/PLATELET
Basophils Absolute: 0.1 10*3/uL (ref 0.0–0.2)
Basos: 1 %
EOS (ABSOLUTE): 0.3 10*3/uL (ref 0.0–0.4)
Eos: 3 %
Hematocrit: 42.1 % (ref 34.0–46.6)
Hemoglobin: 12.6 g/dL (ref 11.1–15.9)
Immature Grans (Abs): 0 10*3/uL (ref 0.0–0.1)
Immature Granulocytes: 0 %
Lymphocytes Absolute: 3 10*3/uL (ref 0.7–3.1)
Lymphs: 30 %
MCH: 23.9 pg — ABNORMAL LOW (ref 26.6–33.0)
MCHC: 29.9 g/dL — ABNORMAL LOW (ref 31.5–35.7)
MCV: 80 fL (ref 79–97)
Monocytes Absolute: 0.6 10*3/uL (ref 0.1–0.9)
Monocytes: 6 %
Neutrophils Absolute: 5.9 10*3/uL (ref 1.4–7.0)
Neutrophils: 60 %
Platelets: 263 10*3/uL (ref 150–450)
RBC: 5.27 x10E6/uL (ref 3.77–5.28)
RDW: 13.9 % (ref 11.7–15.4)
WBC: 10 10*3/uL (ref 3.4–10.8)

## 2021-04-23 LAB — COMPREHENSIVE METABOLIC PANEL
ALT: 76 IU/L — ABNORMAL HIGH (ref 0–32)
AST: 64 IU/L — ABNORMAL HIGH (ref 0–40)
Albumin/Globulin Ratio: 1.5 (ref 1.2–2.2)
Albumin: 4.3 g/dL (ref 3.8–4.8)
Alkaline Phosphatase: 177 IU/L — ABNORMAL HIGH (ref 44–121)
BUN/Creatinine Ratio: 10 (ref 9–23)
BUN: 8 mg/dL (ref 6–24)
Bilirubin Total: 0.6 mg/dL (ref 0.0–1.2)
CO2: 22 mmol/L (ref 20–29)
Calcium: 9 mg/dL (ref 8.7–10.2)
Chloride: 96 mmol/L (ref 96–106)
Creatinine, Ser: 0.83 mg/dL (ref 0.57–1.00)
Globulin, Total: 2.9 g/dL (ref 1.5–4.5)
Glucose: 455 mg/dL — ABNORMAL HIGH (ref 70–99)
Potassium: 4.6 mmol/L (ref 3.5–5.2)
Sodium: 132 mmol/L — ABNORMAL LOW (ref 134–144)
Total Protein: 7.2 g/dL (ref 6.0–8.5)
eGFR: 89 mL/min/{1.73_m2} (ref >59)

## 2021-04-23 LAB — LIPID PANEL
Chol/HDL Ratio: 4.5 ratio — ABNORMAL HIGH (ref 0.0–4.4)
Cholesterol, Total: 202 mg/dL — ABNORMAL HIGH (ref 100–199)
HDL: 45 mg/dL (ref >39)
LDL Chol Calc (NIH): 113 mg/dL — ABNORMAL HIGH (ref 0–99)
Triglycerides: 252 mg/dL — ABNORMAL HIGH (ref 0–149)
VLDL Cholesterol Cal: 44 mg/dL — ABNORMAL HIGH (ref 5–40)

## 2021-04-23 LAB — HEMOGLOBIN A1C
Est. average glucose Bld gHb Est-mCnc: 298 mg/dL
Hgb A1c MFr Bld: 12 % — ABNORMAL HIGH (ref 4.8–5.6)

## 2021-04-23 LAB — VITAMIN D 25 HYDROXY (VIT D DEFICIENCY, FRACTURES): Vit D, 25-Hydroxy: 29.8 ng/mL — ABNORMAL LOW (ref 30.0–100.0)

## 2021-04-23 NOTE — Patient Instructions (Signed)
Plan: Avoid overhead lifting and overhead use of the arms. Do not lift greater than 5 lbs. Adjust head rest in vehicle to prevent hyperextension if rear ended. Take extra precautions to avoid falling. Arthritis medications help with the pain, tylenol up to 3000 mg per day in four doses. Hot showers and ice packs are helpful. The results of the myelogram and post myelogram CT scan of the neck suggest mild spondylosis without Significant nerve compression and I do not recommend surgical treatment at this time . Eventually surgery May become necessary if the condition becomes worse but presently the risk of surgey is greater than any Benefit you might see. I recommend therapy and a neurology evaluation as the findings do not explain all of the symptoms you are Experiencing and there are other causes that may be of concern.  Follow up in  6 months.

## 2021-04-23 NOTE — Progress Notes (Signed)
Office Visit Note   Patient: Alexandra Norris           Date of Birth: 1976/01/08           MRN: [id number] Visit Date: 04/23/2021              Requested by: Orma Render, NP [place],  [place] [address] PCP: Orma Render, NP   Assessment & Plan: Visit Diagnoses:  1. Cubital tunnel syndrome on right   2. Spondylosis without myelopathy or radiculopathy, cervical region   3. Type 2 diabetes mellitus with diabetic polyneuropathy, without long-term current use of insulin (HCC)     Plan: Avoid overhead lifting and overhead use of the arms. Do not lift greater than 5 lbs. Adjust head rest in vehicle to prevent hyperextension if rear ended. Take extra precautions to avoid falling. Arthritis medications help with the pain, tylenol up to 3000 mg per day in four doses. Hot showers and ice packs are helpful. The results of the myelogram and post myelogram CT scan of the neck suggest mild spondylosis without Significant nerve compression and I do not recommend surgical treatment at this time . Eventually surgery May become necessary if the condition becomes worse but presently the risk of surgey is greater than any Benefit you might see. I recommend therapy and a neurology evaluation as the findings do not explain all of the symptoms you are Experiencing and there are other causes that may be of concern.  Follow up in  6 months.   Follow-Up Instructions: No follow-ups on file.   Orders:  Orders Placed This Encounter  Procedures   Ambulatory referral to Neurology   No orders of the defined types were placed in this encounter.     Procedures: No procedures performed   Clinical Data: No additional findings.   Subjective: Chief Complaint  Patient presents with   Neck - Follow-up    Cervical Myelogram Review    45 year old female with history of diabetes mellitus that apparently is difficult to control. She has complaints of UE  numbness and paresthesias and has Had a previous ACDF at [facility]. She has had left shoulder surgery by Dr. Erlinda Hong and a recent MRI of the right shoulder. At today's visit she is Having numbness and tingling into the right ulnar two digits consistent with cubital tunnel or ulnar neuropathy.  Review of Systems  Constitutional: Negative.   HENT: Negative.    Eyes: Negative.   Respiratory: Negative.    Cardiovascular: Negative.   Gastrointestinal: Negative.   Endocrine: Negative.   Genitourinary: Negative.   Musculoskeletal: Negative.   Skin: Negative.   Allergic/Immunologic: Negative.   Neurological: Negative.   Hematological: Negative.   Psychiatric/Behavioral: Negative.      Objective: Vital Signs: BP 110/69 (BP Location: Left Arm, Patient Position: Sitting)   Pulse (!) 110   Ht 5\' 1"  (1.549 m)   Wt 240 lb (108.9 kg)   LMP 09/20/2019 (Exact Date)   BMI 45.35 kg/m   Physical Exam Constitutional:      Appearance: She is well-developed.  HENT:     Head: Normocephalic and atraumatic.  Eyes:     Pupils: Pupils are equal, round, and reactive to light.  Pulmonary:     Effort: Pulmonary effort is normal.     Breath sounds: Normal breath sounds.  Abdominal:     General: Bowel sounds are normal.     Palpations: Abdomen is soft.  Musculoskeletal:     Cervical back: Normal range of motion and neck supple.     Lumbar back: Negative right straight leg raise test and negative left straight leg raise test.  Skin:    General: Skin is warm and dry.  Neurological:     Mental Status: She is alert and oriented to person, place, and time.  Psychiatric:        Behavior: Behavior normal.        Thought Content: Thought content normal.        Judgment: Judgment normal.   Back Exam   Tenderness  The patient is experiencing tenderness in the cervical and lumbar.  Range of Motion  Extension:  abnormal  Flexion:  abnormal  Lateral bend right:  abnormal  Lateral bend left:   abnormal  Rotation right:  abnormal  Rotation left:  abnormal   Muscle Strength  Right Quadriceps:  5/5  Left Quadriceps:  5/5  Right Hamstrings:  5/5  Left Hamstrings:  5/5   Tests  Straight leg raise right: negative Straight leg raise left: negative  Reflexes  Patellar:  0/4 Achilles:  0/4 Biceps:  0/4 Babinski's sign: normal   Other  Toe walk: normal Heel walk: normal Sensation: normal Gait: normal  Erythema: no back redness Scars: absent    Specialty Comments:  No specialty comments available.  Imaging: No results found.   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   Shingles 11/14/2020   Neck pain 10/14/2020   Encounter to establish care 09/09/2020   Fatty liver disease, nonalcoholic 85/27/7824   Influenza vaccine needed 04/03/2020   Cervical disc disorder at C5-C6 level with radiculopathy 04/03/2020   Arthritis    Nasal obstruction 02/18/2016   DM2 (diabetes mellitus, type 2) ([place]) 10/21/2015   Vitamin D deficiency 09/25/2015   GASTROESOPHAGEAL REFLUX DISEASE 08/14/2009   Body mass index (BMI) 45.0-49.9, adult ([place]) 05/30/2007   Moderate persistent asthma with acute exacerbation 12/06/2006   Past Medical History:  Diagnosis Date   Anemia    Anemia    Anxiety    Arthritis    Asthma    Bilateral chronic knee pain 09/25/2015   Carpal tunnel syndrome    Depression    DEPRESSION, SITUATIONAL, PROLONGED 02/05/2009   Qualifier: Diagnosis of  By: Sherilyn Cooter  MD, Khary     Dyspnea    Elevated blood-pressure reading, without diagnosis of hypertension 06/17/2020   Endometriosis     History of blood transfusion 1999   w/vaginal delivery   History of bronchitis    "I've stayed in hospital 2 X w/bronchitis"   Left carpal tunnel syndrome 07/11/2019   Migraine    Pneumonia    PONV (postoperative nausea and vomiting)    Pre-diabetes 10/03/2019   A1C 6.4   Right carpal tunnel syndrome 07/11/2019   S/P carpal tunnel release 08/30/2019   Seasonal allergies 10/08/2016    Family History  Problem Relation Age of Onset   Diabetes Mother    Osteoarthritis Mother    Hypertension Mother    Hyperlipidemia Mother    Arthritis Mother     Past Surgical History:  Procedure Laterality  Date   ABDOMINAL HYSTERECTOMY  10/17/2019   APPENDECTOMY     CARPAL TUNNEL RELEASE Right 07/19/2019   Procedure: RIGHT CARPAL TUNNEL RELEASE;  Surgeon: Leandrew Koyanagi, MD;  Location: [place];  Service: Orthopedics;  Laterality: Right;   CARPAL TUNNEL RELEASE Left 09/13/2019   Procedure: LEFT CARPAL TUNNEL RELEASE;  Surgeon: Leandrew Koyanagi, MD;  Location: [place];  Service: Orthopedics;  Laterality: Left;  Bier block   [place]; 2008   CHOLECYSTECTOMY  2011   SHOULDER ARTHROSCOPY WITH DISTAL CLAVICLE RESECTION  02/12/2021   Procedure: SHOULDER ARTHROSCOPY WITH DISTAL CLAVICLE RESECTION;  Surgeon: Leandrew Koyanagi, MD;  Location: [place];  Service: Orthopedics;;   SHOULDER ARTHROSCOPY WITH ROTATOR CUFF REPAIR AND SUBACROMIAL DECOMPRESSION Left 02/12/2021   Procedure: LEFT SHOULDER ARTHROSCOPY WITH ROTATOR CUFF REPAIR, SUBACROMIAL DECOMPRESSION, EXTENSIVE DEBRIDEMENT;  Surgeon: Leandrew Koyanagi, MD;  Location: [place];  Service: Orthopedics;  Laterality: Left;   TONSILLECTOMY     tonsilletomy     TUBAL LIGATION  04/2007   VAGINAL HYSTERECTOMY Bilateral 10/17/2019   Procedure: HYSTERECTOMY VAGINAL WITH SALPINGECTOMY;  Surgeon: Chancy Milroy, MD;  Location: [place];  Service: Gynecology;  Laterality: Bilateral;   Social History    Occupational History   Not on file  Tobacco Use   Smoking status: Never   Smokeless tobacco: Never  Vaping Use   Vaping Use: Never used  Substance and Sexual Activity   Alcohol use: No   Drug use: No   Sexual activity: Yes    Birth control/protection: Surgical

## 2021-04-23 NOTE — Therapy (Signed)
[facility] Physical Therapy [address] [place], [place], [address] Phone: [phone]   Fax:  [phone]  Physical Therapy Treatment/Recertification  Patient Details  Name: Alexandra Norris MRN: [id number] Date of Birth: [birth year] Referring Provider (PT): Dwana Melena PA-C   Encounter Date: 04/23/2021  Progress Note Reporting Period 02/21/2021 to 04/23/2021  See note below for Objective Data and Assessment of Progress/Goals.       PT End of Session - 04/23/21 0842     Visit Number 10    Number of Visits 22    Date for PT Re-Evaluation 06/04/21    Authorization Type BCBS    Progress Note Due on Visit 20    PT Start Time 0844    PT Stop Time 0923    PT Time Calculation (min) 39 min    Activity Tolerance Patient tolerated treatment well    Behavior During Therapy [facility] for tasks assessed/performed             Past Medical History:  Diagnosis Date   Anemia    Anemia    Anxiety    Arthritis    Asthma    Bilateral chronic knee pain 09/25/2015   Carpal tunnel syndrome    Depression    DEPRESSION, SITUATIONAL, PROLONGED 02/05/2009   Qualifier: Diagnosis of  By: Sherilyn Cooter  MD, Khary     Dyspnea    Elevated blood-pressure reading, without diagnosis of hypertension 06/17/2020   Endometriosis    History of blood transfusion 1999   w/vaginal delivery   History of bronchitis    "I've stayed in hospital 2 X w/bronchitis"   Left carpal tunnel syndrome 07/11/2019   Migraine    Pneumonia    PONV (postoperative nausea and vomiting)    Pre-diabetes 10/03/2019   A1C 6.4   Right carpal tunnel syndrome 07/11/2019   S/P carpal tunnel release 08/30/2019   Seasonal allergies 10/08/2016    Past Surgical History:  Procedure Laterality Date   ABDOMINAL HYSTERECTOMY  10/17/2019   APPENDECTOMY     CARPAL TUNNEL RELEASE Right 07/19/2019   Procedure: RIGHT CARPAL TUNNEL RELEASE;  Surgeon: Leandrew Koyanagi, MD;  Location: [place];   Service: Orthopedics;  Laterality: Right;   CARPAL TUNNEL RELEASE Left 09/13/2019   Procedure: LEFT CARPAL TUNNEL RELEASE;  Surgeon: Leandrew Koyanagi, MD;  Location: [place];  Service: Orthopedics;  Laterality: Left;  Bier block   [place]; 2008   CHOLECYSTECTOMY  2011   SHOULDER ARTHROSCOPY WITH DISTAL CLAVICLE RESECTION  02/12/2021   Procedure: SHOULDER ARTHROSCOPY WITH DISTAL CLAVICLE RESECTION;  Surgeon: Leandrew Koyanagi, MD;  Location: [place];  Service: Orthopedics;;   SHOULDER ARTHROSCOPY WITH ROTATOR CUFF REPAIR AND SUBACROMIAL DECOMPRESSION Left 02/12/2021   Procedure: LEFT SHOULDER ARTHROSCOPY WITH ROTATOR CUFF REPAIR, SUBACROMIAL DECOMPRESSION, EXTENSIVE DEBRIDEMENT;  Surgeon: Leandrew Koyanagi, MD;  Location: [place];  Service: Orthopedics;  Laterality: Left;   TONSILLECTOMY     tonsilletomy     TUBAL LIGATION  04/2007   VAGINAL HYSTERECTOMY Bilateral 10/17/2019   Procedure: HYSTERECTOMY VAGINAL WITH SALPINGECTOMY;  Surgeon: Chancy Milroy, MD;  Location: [place];  Service: Gynecology;  Laterality: Bilateral;    There were no vitals filed for this visit.   Subjective Assessment - 04/23/21 0846     Subjective Pt. indicated general sore in body today.  Rated symptoms at worst in shoulder 4/10.    Patient is accompained by: Interpreter  Patient Stated Goals Reduce pain    Currently in Pain? Yes    Pain Score 4    at worst in last week   Pain Location Shoulder    Pain Orientation Left    Pain Descriptors / Indicators Sore    Pain Type Surgical pain    Pain Onset More than a month ago    Pain Frequency Intermittent    Aggravating Factors  lifting arm    Pain Relieving Factors rest                [facility] PT Assessment - 04/23/21 0001       Assessment   Medical Diagnosis Z98.890 (ICD-10-CM) - S/P arthroscopy of left shoulder    Referring Provider (PT) Dwana Melena PA-C    Onset Date/Surgical Date 02/12/21     Hand Dominance Right      Observation/Other Assessments   Focus on Therapeutic Outcomes (FOTO)  updated 50%      AROM   Left Shoulder Flexion 140 Degrees   measured in supine, pain at end range   Left Shoulder ABduction 110 Degrees   measured in supine, pain at end range   Left Shoulder Internal Rotation 70 Degrees   measured in supine45 deg aduction, pain at end range   Left Shoulder External Rotation 45 Degrees   measured in supine 45 deg abduction, pain at end range     Strength   Left Shoulder Flexion 4/5    Left Shoulder ABduction 4/5    Left Shoulder Internal Rotation 5/5    Left Shoulder External Rotation 4/5    Left Elbow Flexion 5/5    Left Elbow Extension 5/5                           [facility] Adult PT Treatment/Exercise - 04/23/21 0001       Shoulder Exercises: Supine   Horizontal ABduction Both   3 x 10 green band   Theraband Level (Shoulder Horizontal ABduction) Level 3 (Green)      Shoulder Exercises: Sidelying   External Rotation Left   3 x 10   External Rotation Weight (lbs) 2    ABduction Left   3 x 10   ABduction Weight (lbs) 1      Shoulder Exercises: ROM/Strengthening   UBE (Upper Arm Bike) Lvl 3.5 4 mins fwd/back each way      Manual Therapy   Manual therapy comments Lt shoulder inferior jt mobs in scaption/abduction mid to end range, mobilization c movement posterior glide c passive ER in 60 deg abduction                       PT Short Term Goals - 03/24/21 0903       PT SHORT TERM GOAL #1   Title Patient will demonstrate independent use of home exercise program to maintain progress from in clinic treatments.    Status Achieved               PT Long Term Goals - 04/23/21 0913       PT LONG TERM GOAL #1   Title Patient will demonstrate/report pain at worst less than or equal to 2/10 to facilitate minimal limitation in daily activity secondary to pain symptoms.    Time 6    Period Weeks    Status Revised     Target Date 06/04/21      PT LONG  TERM GOAL #2   Title Patient will demonstrate independent use of home exercise program to facilitate ability to maintain/progress functional gains from skilled physical therapy services.    Time 6    Period Weeks    Status Revised    Target Date 06/04/21      PT LONG TERM GOAL #3   Title Patient will demonstrate return to work/recreational activity at previous level of function without limitations secondary due to condition    Time 6    Period Weeks    Status Revised    Target Date 06/04/21      PT LONG TERM GOAL #4   Title Pt. will demonstrate FOTO outcome > or = 63% to indicated reduced disability due to condition.    Time 6    Period Weeks    Status Revised    Target Date 06/04/21      PT LONG TERM GOAL #5   Title Patient will demonstrate Lt [place] joint mobility WFL to facilitate usual self care, dressing, reaching overhead at PLOF s limitation due to symptoms.    Time 6    Period Weeks    Status Revised    Target Date 06/04/21      PT LONG TERM GOAL #6   Title Patient will demonstrate Lt UE MMT 4/5 throughout to facilitate usual lifting, carrying in functional activity to PLOF s limitation.    Time 6    Period Weeks    Status Revised    Target Date 06/04/21                   Plan - 04/23/21 0912     Clinical Impression Statement Pt. has attended 10 visits overall during course of treatment, reporting moderate symptoms at worst in last week or so.  See objective data for updated information.  Pt. has demonstrated improvements in mobility, strength and functional FOTO assessment to this point.  Pt. did continue to present c end range mobility deficits, strength deficits which can lead to increased difficulty c daily activity.  Continued skilled PT services indicated at this time.    Personal Factors and Comorbidities Comorbidity 3+    Comorbidities Depression, anxiety, history of Carpal tunnel syndrome    Examination-Activity  Limitations Sleep;Sit;Bed Mobility;Bathing;Carry;Caring for Others;Dressing;Hygiene/Grooming;Lift;Reach Overhead    Examination-Participation Restrictions Occupation;Meal Prep;Laundry;Interpersonal Relationship;Driving;Community Activity;Cleaning    Stability/Clinical Decision Making Stable/Uncomplicated    Rehab Potential Good    PT Frequency 2x / week    PT Duration 6 weeks    PT Treatment/Interventions ADLs/Self Care Home Management;Cryotherapy;Electrical Stimulation;Iontophoresis 4mg /ml Dexamethasone;Moist Heat;Therapeutic exercise;Therapeutic activities;Functional mobility training;DME Instruction;Ultrasound;Neuromuscular re-education;Patient/family education;Passive range of motion;Spinal Manipulations;Joint Manipulations;Dry needling;Taping;Vasopneumatic Device;Manual techniques    PT Next Visit Plan Continue progressive strengthening, end range mobility gains for Lt shoulder.    PT Home Exercise Plan [phone]    Consulted and Agree with Plan of Care Patient             Patient will benefit from skilled therapeutic intervention in order to improve the following deficits and impairments:  Hypomobility, Increased edema, Decreased activity tolerance, Decreased strength, Impaired UE functional use, Pain, Difficulty walking, Decreased mobility, Decreased range of motion, Impaired perceived functional ability, Improper body mechanics, Postural dysfunction, Impaired flexibility, Decreased coordination  Visit Diagnosis: Chronic left shoulder pain - Plan: PT plan of care cert/re-cert  Muscle weakness (generalized) - Plan: PT plan of care cert/re-cert  Abnormal posture - Plan: PT plan of care cert/re-cert  Localized edema - Plan: PT plan  of care cert/re-cert     Problem List Patient Active Problem List   Diagnosis Date Noted   Chronic left shoulder pain 04/22/2021   Hypertension associated with diabetes ([place]) 04/22/2021   Hyperlipidemia associated with type 2 diabetes mellitus ([place])  04/22/2021   Dizziness 04/22/2021   Fatigue 04/22/2021   Nontraumatic tear of left supraspinatus tendon 02/12/2021   Impingement syndrome of left shoulder    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   Shingles 11/14/2020   Neck pain 10/14/2020   Encounter to establish care 09/09/2020   Fatty liver disease, nonalcoholic 25/42/7062   Influenza vaccine needed 04/03/2020   Cervical disc disorder at C5-C6 level with radiculopathy 04/03/2020   Arthritis    Nasal obstruction 02/18/2016   DM2 (diabetes mellitus, type 2) ([place]) 10/21/2015   Vitamin D deficiency 09/25/2015   GASTROESOPHAGEAL REFLUX DISEASE 08/14/2009   Body mass index (BMI) 45.0-49.9, adult ([place]) 05/30/2007   Moderate persistent asthma with acute exacerbation 12/06/2006    Scot Jun, PT, DPT, OCS, ATC 04/23/21  9:17 AM    [facility] Physical Therapy [address] [place], [place], [address] Phone: [phone]   Fax:  [phone]  Name: Alexandra Norris MRN: [id number] Date of Birth: 1976-04-04

## 2021-04-24 ENCOUNTER — Other Ambulatory Visit (HOSPITAL_BASED_OUTPATIENT_CLINIC_OR_DEPARTMENT_OTHER): Payer: Self-pay | Admitting: Nurse Practitioner

## 2021-04-24 ENCOUNTER — Ambulatory Visit: Payer: BLUE CROSS/BLUE SHIELD | Admitting: Orthopaedic Surgery

## 2021-04-24 ENCOUNTER — Encounter: Payer: Self-pay | Admitting: Orthopaedic Surgery

## 2021-04-24 DIAGNOSIS — M19011 Primary osteoarthritis, right shoulder: Secondary | ICD-10-CM

## 2021-04-24 DIAGNOSIS — Z6841 Body Mass Index (BMI) 40.0 and over, adult: Secondary | ICD-10-CM

## 2021-04-24 DIAGNOSIS — S43431A Superior glenoid labrum lesion of right shoulder, initial encounter: Secondary | ICD-10-CM

## 2021-04-24 DIAGNOSIS — I152 Hypertension secondary to endocrine disorders: Secondary | ICD-10-CM

## 2021-04-24 DIAGNOSIS — K760 Fatty (change of) liver, not elsewhere classified: Secondary | ICD-10-CM

## 2021-04-24 DIAGNOSIS — E1165 Type 2 diabetes mellitus with hyperglycemia: Secondary | ICD-10-CM

## 2021-04-24 DIAGNOSIS — E785 Hyperlipidemia, unspecified: Secondary | ICD-10-CM

## 2021-04-24 NOTE — Progress Notes (Signed)
Office Visit Note   Patient: Alexandra Norris           Date of Birth: [birth year]           MRN: [id number] Visit Date: 04/24/2021              Requested by: Orma Render, NP [place],  [place] [id number] PCP: Orma Render, NP   Assessment & Plan: Visit Diagnoses:  1. Type 1 superior labral anterior-to-posterior (SLAP) tear of right shoulder, initial encounter   2. Arthritis of right acromioclavicular joint     Plan: MRI shows Norris type I SLAP tear with small paralabral cyst and moderate AC joint arthritis.  These findings were reviewed with the patient in detail.  Treatment options were reviewed and based on her options she has elected for arthroscopic biceps tenotomy with paralabral cyst decompression as well as distal clavicle excision and subacromial decompression.  Rotator cuff appears to be intact.  Risk benefits rehab recovery reviewed with the patient again.  Interpreter present today.  Follow-Up Instructions: No follow-ups on file.   Orders:  No orders of the defined types were placed in this encounter.  No orders of the defined types were placed in this encounter.     Procedures: No procedures performed   Clinical Data: No additional findings.   Subjective: Chief Complaint  Patient presents with   Right Shoulder - Pain    HPI  Alexandra Norris returns today to discuss right shoulder MRI.  Interpreter present today.  Review of Systems   Objective: Vital Signs: LMP 09/20/2019 (Exact Date)   Physical Exam  Ortho Exam  Right shoulder exam shows positive O'Brien and cross body adduction.  Specialty Comments:  No specialty comments available.  Imaging: No results found.   PMFS History: Patient Active Problem List   Diagnosis Date Noted   Type 1 superior labral anterior-to-posterior (SLAP) tear of right shoulder 04/24/2021   Arthritis of right acromioclavicular joint 04/24/2021   Chronic left shoulder pain  04/22/2021   Hypertension associated with diabetes ([place]) 04/22/2021   Hyperlipidemia associated with type 2 diabetes mellitus ([place]) 04/22/2021   Dizziness 04/22/2021   Fatigue 04/22/2021   Nontraumatic tear of left supraspinatus tendon 02/12/2021   Impingement syndrome of left shoulder    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   Shingles 11/14/2020   Neck pain 10/14/2020   Encounter to establish care 09/09/2020   Fatty liver disease, nonalcoholic 41/28/7867   Influenza vaccine needed 04/03/2020   Cervical disc disorder at C5-C6 level with radiculopathy 04/03/2020   Arthritis    Nasal obstruction 02/18/2016   DM2 (diabetes mellitus, type 2) ([place]) 10/21/2015   Vitamin D deficiency 09/25/2015   GASTROESOPHAGEAL REFLUX DISEASE 08/14/2009   Body mass index (BMI) 45.0-49.9, adult ([place]) 05/30/2007   Moderate persistent asthma with acute exacerbation 12/06/2006   Past Medical History:  Diagnosis Date   Anemia    Anemia    Anxiety    Arthritis    Asthma    Bilateral chronic knee pain 09/25/2015   Carpal tunnel syndrome    Depression    DEPRESSION, SITUATIONAL, PROLONGED 02/05/2009   Qualifier: Diagnosis of  By: Sherilyn Cooter  MD, Khary     Dyspnea    Elevated blood-pressure reading, without diagnosis of hypertension 06/17/2020   Endometriosis    History of blood transfusion 1999   w/vaginal delivery   History of bronchitis    "I've stayed in hospital 2 X w/bronchitis"   Left  carpal tunnel syndrome 07/11/2019   Migraine    Pneumonia    PONV (postoperative nausea and vomiting)    Pre-diabetes 10/03/2019   A1C 6.4   Right carpal tunnel syndrome 07/11/2019   S/P carpal tunnel release 08/30/2019   Seasonal allergies 10/08/2016    Family History  Problem Relation Age of Onset   Diabetes Mother    Osteoarthritis Mother    Hypertension Mother    Hyperlipidemia Mother    Arthritis Mother     Past Surgical History:  Procedure Laterality Date   ABDOMINAL HYSTERECTOMY  10/17/2019    APPENDECTOMY     CARPAL TUNNEL RELEASE Right 07/19/2019   Procedure: RIGHT CARPAL TUNNEL RELEASE;  Surgeon: Leandrew Koyanagi, MD;  Location: [place];  Service: Orthopedics;  Laterality: Right;   CARPAL TUNNEL RELEASE Left 09/13/2019   Procedure: LEFT CARPAL TUNNEL RELEASE;  Surgeon: Leandrew Koyanagi, MD;  Location: [place];  Service: Orthopedics;  Laterality: Left;  Bier block   [place]; 2008   CHOLECYSTECTOMY  2011   SHOULDER ARTHROSCOPY WITH DISTAL CLAVICLE RESECTION  02/12/2021   Procedure: SHOULDER ARTHROSCOPY WITH DISTAL CLAVICLE RESECTION;  Surgeon: Leandrew Koyanagi, MD;  Location: [place];  Service: Orthopedics;;   SHOULDER ARTHROSCOPY WITH ROTATOR CUFF REPAIR AND SUBACROMIAL DECOMPRESSION Left 02/12/2021   Procedure: LEFT SHOULDER ARTHROSCOPY WITH ROTATOR CUFF REPAIR, SUBACROMIAL DECOMPRESSION, EXTENSIVE DEBRIDEMENT;  Surgeon: Leandrew Koyanagi, MD;  Location: [place];  Service: Orthopedics;  Laterality: Left;   TONSILLECTOMY     tonsilletomy     TUBAL LIGATION  04/2007   VAGINAL HYSTERECTOMY Bilateral 10/17/2019   Procedure: HYSTERECTOMY VAGINAL WITH SALPINGECTOMY;  Surgeon: Chancy Milroy, MD;  Location: [place];  Service: Gynecology;  Laterality: Bilateral;   Social History   Occupational History   Not on file  Tobacco Use   Smoking status: Never   Smokeless tobacco: Never  Vaping Use   Vaping Use: Never used  Substance and Sexual Activity   Alcohol use: No   Drug use: No   Sexual activity: Yes    Birth control/protection: Surgical

## 2021-04-28 ENCOUNTER — Encounter: Payer: Self-pay | Admitting: Rehabilitative and Restorative Service Providers"

## 2021-04-28 ENCOUNTER — Other Ambulatory Visit: Payer: Self-pay

## 2021-04-28 ENCOUNTER — Ambulatory Visit (INDEPENDENT_AMBULATORY_CARE_PROVIDER_SITE_OTHER): Payer: BLUE CROSS/BLUE SHIELD | Admitting: Rehabilitative and Restorative Service Providers"

## 2021-04-28 DIAGNOSIS — M6281 Muscle weakness (generalized): Secondary | ICD-10-CM

## 2021-04-28 DIAGNOSIS — R293 Abnormal posture: Secondary | ICD-10-CM

## 2021-04-28 DIAGNOSIS — R600 Localized edema: Secondary | ICD-10-CM | POA: Diagnosis not present

## 2021-04-28 DIAGNOSIS — G8929 Other chronic pain: Secondary | ICD-10-CM

## 2021-04-28 DIAGNOSIS — M25512 Pain in left shoulder: Secondary | ICD-10-CM | POA: Diagnosis not present

## 2021-04-28 NOTE — Therapy (Signed)
[facility] [address] [place], [place], [address] Phone: [phone]   Fax:  [phone]  Physical Therapy Treatment  Patient Details  Name: Alexandra Norris MRN: [id number] Date of Birth: [birth year] Referring Provider (PT): Dwana Melena PA-C   Encounter Date: 04/28/2021   PT End of Session - 04/28/21 0843     Visit Number 11    Number of Visits 22    Date for PT Re-Evaluation 06/04/21    Authorization Type BCBS    Progress Note Due on Visit 20    PT Start Time 0844    PT Stop Time 0923    PT Time Calculation (min) 39 min    Activity Tolerance Patient tolerated treatment well    Behavior During Therapy [facility] for tasks assessed/performed             Past Medical History:  Diagnosis Date   Anemia    Anemia    Anxiety    Arthritis    Asthma    Bilateral chronic knee pain 09/25/2015   Carpal tunnel syndrome    Depression    DEPRESSION, SITUATIONAL, PROLONGED 02/05/2009   Qualifier: Diagnosis of  By: Sherilyn Cooter  MD, Hosie Poisson     Dyspnea    Elevated blood-pressure reading, without diagnosis of hypertension 06/17/2020   Endometriosis    History of blood transfusion 1999   w/vaginal delivery   History of bronchitis    "I've stayed in hospital 2 X w/bronchitis"   Left carpal tunnel syndrome 07/11/2019   Migraine    Pneumonia    PONV (postoperative nausea and vomiting)    Pre-diabetes 10/03/2019   A1C 6.4   Right carpal tunnel syndrome 07/11/2019   S/P carpal tunnel release 08/30/2019   Seasonal allergies 10/08/2016    Past Surgical History:  Procedure Laterality Date   ABDOMINAL HYSTERECTOMY  10/17/2019   APPENDECTOMY     CARPAL TUNNEL RELEASE Right 07/19/2019   Procedure: RIGHT CARPAL TUNNEL RELEASE;  Surgeon: Leandrew Koyanagi, MD;  Location: [place];  Service: Orthopedics;  Laterality: Right;   CARPAL TUNNEL RELEASE Left 09/13/2019   Procedure: LEFT CARPAL TUNNEL RELEASE;  Surgeon: Leandrew Koyanagi, MD;   Location: [place];  Service: Orthopedics;  Laterality: Left;  Bier block   [place]; 2008   CHOLECYSTECTOMY  2011   SHOULDER ARTHROSCOPY WITH DISTAL CLAVICLE RESECTION  02/12/2021   Procedure: SHOULDER ARTHROSCOPY WITH DISTAL CLAVICLE RESECTION;  Surgeon: Leandrew Koyanagi, MD;  Location: [place];  Service: Orthopedics;;   SHOULDER ARTHROSCOPY WITH ROTATOR CUFF REPAIR AND SUBACROMIAL DECOMPRESSION Left 02/12/2021   Procedure: LEFT SHOULDER ARTHROSCOPY WITH ROTATOR CUFF REPAIR, SUBACROMIAL DECOMPRESSION, EXTENSIVE DEBRIDEMENT;  Surgeon: Leandrew Koyanagi, MD;  Location: [place];  Service: Orthopedics;  Laterality: Left;   TONSILLECTOMY     tonsilletomy     TUBAL LIGATION  04/2007   VAGINAL HYSTERECTOMY Bilateral 10/17/2019   Procedure: HYSTERECTOMY VAGINAL WITH SALPINGECTOMY;  Surgeon: Chancy Milroy, MD;  Location: [place];  Service: Gynecology;  Laterality: Bilateral;    There were no vitals filed for this visit.   Subjective Assessment - 04/28/21 0849     Subjective Pt. stated no pain today upon arrival and reported arm felt pretty good overall since last visit.  Pt. indicated she was a little sick over the weekend but better now.    Patient is accompained by: Interpreter    Patient Stated Goals Reduce pain  Currently in Pain? No/denies    Pain Score 0-No pain    Pain Onset More than a month ago                               [facility] Adult PT Treatment/Exercise - 04/28/21 0001       Shoulder Exercises: Supine   Horizontal ABduction Both   2 x 10   Theraband Level (Shoulder Horizontal ABduction) Level 3 (Green)    Other Supine Exercises supine d2 ext green 2 x 10 Lt arm      Shoulder Exercises: Standing   External Rotation Left   3 x 10 slow return c towel at side   Theraband Level (Shoulder External Rotation) Level 4 (Blue)    Internal Rotation Left   3 x 10 slow return c towel at side   Theraband Level  (Shoulder Internal Rotation) Level 4 (Blue)    Extension Both   3 x 10   Theraband Level (Shoulder Extension) Level 4 (Blue)    Row Both   3 x 10   Theraband Level (Shoulder Row) Level 4 (Blue)    Other Standing Exercises wall pus hup c SA press hold 3 x 10    Other Standing Exercises UE ranger flexion Lt arm 2 x 10      Shoulder Exercises: ROM/Strengthening   UBE (Upper Arm Bike) Lvl 3.5 3 mins fwd/back each way                     PT Education - 04/28/21 0924     Education Details HEP progression (band inclusion)    Person(s) Educated Patient    Methods Explanation;Demonstration;Verbal cues;Handout    Comprehension Verbalized understanding;Returned demonstration              PT Short Term Goals - 03/24/21 0903       PT SHORT TERM GOAL #1   Title Patient will demonstrate independent use of home exercise program to maintain progress from in clinic treatments.    Status Achieved               PT Long Term Goals - 04/23/21 0913       PT LONG TERM GOAL #1   Title Patient will demonstrate/report pain at worst less than or equal to 2/10 to facilitate minimal limitation in daily activity secondary to pain symptoms.    Time 6    Period Weeks    Status Revised    Target Date 06/04/21      PT LONG TERM GOAL #2   Title Patient will demonstrate independent use of home exercise program to facilitate ability to maintain/progress functional gains from skilled physical therapy services.    Time 6    Period Weeks    Status Revised    Target Date 06/04/21      PT LONG TERM GOAL #3   Title Patient will demonstrate return to work/recreational activity at previous level of function without limitations secondary due to condition    Time 6    Period Weeks    Status Revised    Target Date 06/04/21      PT LONG TERM GOAL #4   Title Pt. will demonstrate FOTO outcome > or = 63% to indicated reduced disability due to condition.    Time 6    Period Weeks    Status  Revised    Target Date 06/04/21  PT LONG TERM GOAL #5   Title Patient will demonstrate Lt [place] joint mobility WFL to facilitate usual self care, dressing, reaching overhead at PLOF s limitation due to symptoms.    Time 6    Period Weeks    Status Revised    Target Date 06/04/21      PT LONG TERM GOAL #6   Title Patient will demonstrate Lt UE MMT 4/5 throughout to facilitate usual lifting, carrying in functional activity to PLOF s limitation.    Time 6    Period Weeks    Status Revised    Target Date 06/04/21                   Plan - 04/28/21 0917     Clinical Impression Statement Lt shoulder abduction passively improved compared to last visit, showing gains towards 110-120 range in supine.  Continued elevation strength deficits noted in attempts against gravity (indicating sidelying home program still warranted).  Pt. mentioned Rt shoulder surgery planned in a few weeks which may impact treamtent plan.    Personal Factors and Comorbidities Comorbidity 3+    Comorbidities Depression, anxiety, history of Carpal tunnel syndrome    Examination-Activity Limitations Sleep;Sit;Bed Mobility;Bathing;Carry;Caring for Others;Dressing;Hygiene/Grooming;Lift;Reach Overhead    Examination-Participation Restrictions Occupation;Meal Prep;Laundry;Interpersonal Relationship;Driving;Community Activity;Cleaning    Stability/Clinical Decision Making Stable/Uncomplicated    Rehab Potential Good    PT Frequency 2x / week    PT Duration 6 weeks    PT Treatment/Interventions ADLs/Self Care Home Management;Cryotherapy;Electrical Stimulation;Iontophoresis 4mg /ml Dexamethasone;Moist Heat;Therapeutic exercise;Therapeutic activities;Functional mobility training;DME Instruction;Ultrasound;Neuromuscular re-education;Patient/family education;Passive range of motion;Spinal Manipulations;Joint Manipulations;Dry needling;Taping;Vasopneumatic Device;Manual techniques    PT Next Visit Plan Continue progressive  strengthening, end range mobility gains for Lt shoulder prior to Rt shoulder surgery.    PT Home Exercise Plan [id number]    Consulted and Agree with Plan of Care Patient             Patient will benefit from skilled therapeutic intervention in order to improve the following deficits and impairments:  Hypomobility, Increased edema, Decreased activity tolerance, Decreased strength, Impaired UE functional use, Pain, Difficulty walking, Decreased mobility, Decreased range of motion, Impaired perceived functional ability, Improper body mechanics, Postural dysfunction, Impaired flexibility, Decreased coordination  Visit Diagnosis: Chronic left shoulder pain  Muscle weakness (generalized)  Abnormal posture  Localized edema     Problem List Patient Active Problem List   Diagnosis Date Noted   Type 1 superior labral anterior-to-posterior (SLAP) tear of right shoulder 04/24/2021   Arthritis of right acromioclavicular joint 04/24/2021   Chronic left shoulder pain 04/22/2021   Hypertension associated with diabetes ([place]) 04/22/2021   Hyperlipidemia associated with type 2 diabetes mellitus ([place]) 04/22/2021   Dizziness 04/22/2021   Fatigue 04/22/2021   Nontraumatic tear of left supraspinatus tendon 02/12/2021   Impingement syndrome of left shoulder    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   Shingles 11/14/2020   Neck pain 10/14/2020   Encounter to establish care 09/09/2020   Fatty liver disease, nonalcoholic 84/53/6468   Influenza vaccine needed 04/03/2020   Cervical disc disorder at C5-C6 level with radiculopathy 04/03/2020   Arthritis    Nasal obstruction 02/18/2016   DM2 (diabetes mellitus, type 2) ([place]) 10/21/2015   Vitamin D deficiency 09/25/2015   GASTROESOPHAGEAL REFLUX DISEASE 08/14/2009   Body mass index (BMI) 45.0-49.9, adult ([place]) 05/30/2007   Moderate persistent asthma with acute exacerbation 12/06/2006    Scot Jun, PT, DPT, OCS, ATC 04/28/21  9:25  AM    [place]  [facility] [address] [place], [place], [address] Phone: [phone]   Fax:  [phone]  Name: Alexandra Norris MRN: [id number] Date of Birth: Feb 28, 1976

## 2021-04-28 NOTE — Patient Instructions (Signed)
Access Code: [email or web address] URL: [email or web address] Date: 04/28/2021 Prepared by: Scot Jun  Exercises Seated Scapular Retraction - 2 x daily - 7 x weekly - 1 sets - 10-15 reps - 5 hold Supine Shoulder Flexion Extension Full Range AROM (Mirrored) - 2 x daily - 7 x weekly - 3 sets - 10-15 reps Sidelying Shoulder Abduction Palm Forward (Mirrored) - 2 x daily - 7 x weekly - 3 sets - 10-15 reps Standing Shoulder Row with Anchored Resistance - 1 x daily - 7 x weekly - 3 sets - 10 reps Shoulder Extension with Resistance - 1 x daily - 7 x weekly - 3 sets - 10 reps Supine Shoulder Horizontal Abduction with Resistance - 1 x daily - 7 x weekly - 3 sets - 10 reps Supine PNF D2 Flexion with Resistance - 1 x daily - 7 x weekly - 3 sets - 10 reps Shoulder External Rotation with Anchored Resistance - 2 x daily - 7 x weekly - 3 sets - 10 reps Shoulder Internal Rotation with Resistance (Mirrored) - 2 x daily - 7 x weekly - 3 sets - 10 reps

## 2021-04-30 ENCOUNTER — Encounter: Payer: Self-pay | Admitting: Rehabilitative and Restorative Service Providers"

## 2021-04-30 ENCOUNTER — Other Ambulatory Visit: Payer: Self-pay

## 2021-04-30 ENCOUNTER — Ambulatory Visit (INDEPENDENT_AMBULATORY_CARE_PROVIDER_SITE_OTHER): Payer: BC Managed Care – PPO | Admitting: Rehabilitative and Restorative Service Providers"

## 2021-04-30 ENCOUNTER — Other Ambulatory Visit (HOSPITAL_COMMUNITY): Payer: Self-pay

## 2021-04-30 DIAGNOSIS — R293 Abnormal posture: Secondary | ICD-10-CM | POA: Diagnosis not present

## 2021-04-30 DIAGNOSIS — M25512 Pain in left shoulder: Secondary | ICD-10-CM | POA: Diagnosis not present

## 2021-04-30 DIAGNOSIS — M6281 Muscle weakness (generalized): Secondary | ICD-10-CM | POA: Diagnosis not present

## 2021-04-30 DIAGNOSIS — R600 Localized edema: Secondary | ICD-10-CM | POA: Diagnosis not present

## 2021-04-30 DIAGNOSIS — G8929 Other chronic pain: Secondary | ICD-10-CM

## 2021-04-30 NOTE — Therapy (Signed)
[facility] [address] [place], [place], [address] Phone: [phone]   Fax:  [phone]  Physical Therapy Treatment  Patient Details  Name: Alexandra Norris MRN: [id number] Date of Birth: 04-19-1976 Referring Provider (PT): Dwana Melena PA-C   Encounter Date: 04/30/2021   PT End of Session - 04/30/21 0845     Visit Number 12    Number of Visits 22    Date for PT Re-Evaluation 06/04/21    Authorization Type [employer]    Progress Note Due on Visit 20    PT Start Time 0845    PT Stop Time 0924    PT Time Calculation (min) 39 min    Activity Tolerance Patient tolerated treatment well    Behavior During Therapy [facility] for tasks assessed/performed             Past Medical History:  Diagnosis Date   Anemia    Anemia    Anxiety    Arthritis    Asthma    Bilateral chronic knee pain 09/25/2015   Carpal tunnel syndrome    Depression    DEPRESSION, SITUATIONAL, PROLONGED 02/05/2009   Qualifier: Diagnosis of  By: Sherilyn Cooter  MD, Hosie Poisson     Dyspnea    Elevated blood-pressure reading, without diagnosis of hypertension 06/17/2020   Endometriosis    History of blood transfusion 1999   w/vaginal delivery   History of bronchitis    "I've stayed in hospital 2 X w/bronchitis"   Left carpal tunnel syndrome 07/11/2019   Migraine    Pneumonia    PONV (postoperative nausea and vomiting)    Pre-diabetes 10/03/2019   A1C 6.4   Right carpal tunnel syndrome 07/11/2019   S/P carpal tunnel release 08/30/2019   Seasonal allergies 10/08/2016    Past Surgical History:  Procedure Laterality Date   ABDOMINAL HYSTERECTOMY  10/17/2019   APPENDECTOMY     CARPAL TUNNEL RELEASE Right 07/19/2019   Procedure: RIGHT CARPAL TUNNEL RELEASE;  Surgeon: Leandrew Koyanagi, MD;  Location: [place];  Service: Orthopedics;  Laterality: Right;   CARPAL TUNNEL RELEASE Left 09/13/2019   Procedure: LEFT CARPAL TUNNEL RELEASE;  Surgeon: Leandrew Koyanagi, MD;   Location: [place];  Service: Orthopedics;  Laterality: Left;  Bier block   [place]; 2008   CHOLECYSTECTOMY  2011   SHOULDER ARTHROSCOPY WITH DISTAL CLAVICLE RESECTION  02/12/2021   Procedure: SHOULDER ARTHROSCOPY WITH DISTAL CLAVICLE RESECTION;  Surgeon: Leandrew Koyanagi, MD;  Location: [place];  Service: Orthopedics;;   SHOULDER ARTHROSCOPY WITH ROTATOR CUFF REPAIR AND SUBACROMIAL DECOMPRESSION Left 02/12/2021   Procedure: LEFT SHOULDER ARTHROSCOPY WITH ROTATOR CUFF REPAIR, SUBACROMIAL DECOMPRESSION, EXTENSIVE DEBRIDEMENT;  Surgeon: Leandrew Koyanagi, MD;  Location: [place];  Service: Orthopedics;  Laterality: Left;   TONSILLECTOMY     tonsilletomy     TUBAL LIGATION  04/2007   VAGINAL HYSTERECTOMY Bilateral 10/17/2019   Procedure: HYSTERECTOMY VAGINAL WITH SALPINGECTOMY;  Surgeon: Chancy Milroy, MD;  Location: [place];  Service: Gynecology;  Laterality: Bilateral;    There were no vitals filed for this visit.   Subjective Assessment - 04/30/21 0848     Subjective Pt. reported feeling no increase in pain in Lt shoulder since last visit and getting better.  Rt arm still painful with certain movements but not worse.    Patient is accompained by: Interpreter    Patient Stated Goals Reduce pain    Currently in Pain?  No/denies    Pain Score 0-No pain    Pain Onset More than a month ago                [facility] PT Assessment - 04/30/21 0001       Assessment   Medical Diagnosis Z98.890 (ICD-10-CM) - S/P arthroscopy of left shoulder    Referring Provider (PT) Dwana Melena PA-C    Onset Date/Surgical Date 02/12/21    Hand Dominance Right                           [facility] Adult PT Treatment/Exercise - 04/30/21 0001       Shoulder Exercises: Standing   External Rotation Left   3 x 15   Theraband Level (Shoulder External Rotation) Level 3 (Green)   reduced from last time due to rep increase   Internal Rotation  Left   3 x 15   Theraband Level (Shoulder Internal Rotation) Level 3 (Green)   reduced from last time due to rep increase   Extension Both   3 x 15   Theraband Level (Shoulder Extension) Level 4 (Blue)    Other Standing Exercises wall push up c SA press hold 3 x 10, standing circles c ball at wall 90 degrees 30x cw, ccw    Other Standing Exercises UE ranger flexion Lt arm 3 x 10 1 lb, scaption 3 x 10 1 lb      Shoulder Exercises: ROM/Strengthening   UBE (Upper Arm Bike) Lvl 3.5 4 mins fwd/back each way                       PT Short Term Goals - 03/24/21 6160       PT SHORT TERM GOAL #1   Title Patient will demonstrate independent use of home exercise program to maintain progress from in clinic treatments.    Status Achieved               PT Long Term Goals - 04/23/21 0913       PT LONG TERM GOAL #1   Title Patient will demonstrate/report pain at worst less than or equal to 2/10 to facilitate minimal limitation in daily activity secondary to pain symptoms.    Time 6    Period Weeks    Status Revised    Target Date 06/04/21      PT LONG TERM GOAL #2   Title Patient will demonstrate independent use of home exercise program to facilitate ability to maintain/progress functional gains from skilled physical therapy services.    Time 6    Period Weeks    Status Revised    Target Date 06/04/21      PT LONG TERM GOAL #3   Title Patient will demonstrate return to work/recreational activity at previous level of function without limitations secondary due to condition    Time 6    Period Weeks    Status Revised    Target Date 06/04/21      PT LONG TERM GOAL #4   Title Pt. will demonstrate FOTO outcome > or = 63% to indicated reduced disability due to condition.    Time 6    Period Weeks    Status Revised    Target Date 06/04/21      PT LONG TERM GOAL #5   Title Patient will demonstrate Lt [place] joint mobility WFL to facilitate usual self care, dressing, reaching  overhead at PLOF s limitation due to symptoms.    Time 6    Period Weeks    Status Revised    Target Date 06/04/21      PT LONG TERM GOAL #6   Title Patient will demonstrate Lt UE MMT 4/5 throughout to facilitate usual lifting, carrying in functional activity to PLOF s limitation.    Time 6    Period Weeks    Status Revised    Target Date 06/04/21                   Plan - 04/30/21 0916     Clinical Impression Statement Improving symptom level has allowed continued progression in progressive strengthening program at this time.  Pt. continued to show gains gains.    Personal Factors and Comorbidities Comorbidity 3+    Comorbidities Depression, anxiety, history of Carpal tunnel syndrome    Examination-Activity Limitations Sleep;Sit;Bed Mobility;Bathing;Carry;Caring for Others;Dressing;Hygiene/Grooming;Lift;Reach Overhead    Examination-Participation Restrictions Occupation;Meal Prep;Laundry;Interpersonal Relationship;Driving;Community Activity;Cleaning    Stability/Clinical Decision Making Stable/Uncomplicated    Rehab Potential Good    PT Frequency 2x / week    PT Duration 6 weeks    PT Treatment/Interventions ADLs/Self Care Home Management;Cryotherapy;Electrical Stimulation;Iontophoresis 4mg /ml Dexamethasone;Moist Heat;Therapeutic exercise;Therapeutic activities;Functional mobility training;DME Instruction;Ultrasound;Neuromuscular re-education;Patient/family education;Passive range of motion;Spinal Manipulations;Joint Manipulations;Dry needling;Taping;Vasopneumatic Device;Manual techniques    PT Next Visit Plan Lt shoulder progressive strengthening.    PT Home Exercise Plan [id number]    Consulted and Agree with Plan of Care Patient             Patient will benefit from skilled therapeutic intervention in order to improve the following deficits and impairments:  Hypomobility, Increased edema, Decreased activity tolerance, Decreased strength, Impaired UE functional use,  Pain, Difficulty walking, Decreased mobility, Decreased range of motion, Impaired perceived functional ability, Improper body mechanics, Postural dysfunction, Impaired flexibility, Decreased coordination  Visit Diagnosis: Chronic left shoulder pain  Muscle weakness (generalized)  Abnormal posture  Localized edema     Problem List Patient Active Problem List   Diagnosis Date Noted   Type 1 superior labral anterior-to-posterior (SLAP) tear of right shoulder 04/24/2021   Arthritis of right acromioclavicular joint 04/24/2021   Chronic left shoulder pain 04/22/2021   Hypertension associated with diabetes ([place]) 04/22/2021   Hyperlipidemia associated with type 2 diabetes mellitus ([place]) 04/22/2021   Dizziness 04/22/2021   Fatigue 04/22/2021   Nontraumatic tear of left supraspinatus tendon 02/12/2021   Impingement syndrome of left shoulder    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   Shingles 11/14/2020   Neck pain 10/14/2020   Encounter to establish care 09/09/2020   Fatty liver disease, nonalcoholic 87/68/1157   Influenza vaccine needed 04/03/2020   Cervical disc disorder at C5-C6 level with radiculopathy 04/03/2020   Arthritis    Nasal obstruction 02/18/2016   DM2 (diabetes mellitus, type 2) ([place]) 10/21/2015   Vitamin D deficiency 09/25/2015   GASTROESOPHAGEAL REFLUX DISEASE 08/14/2009   Body mass index (BMI) 45.0-49.9, adult ([place]) 05/30/2007   Moderate persistent asthma with acute exacerbation 12/06/2006    Scot Jun, PT, DPT, OCS, ATC 04/30/21  9:25 AM    [facility] [address] [place], [place], [address] Phone: [phone]   Fax:  [phone]  Name: Jasmin Winberry MRN: [id number] Date of Birth: [birth year]

## 2021-05-05 ENCOUNTER — Encounter: Payer: BC Managed Care – PPO | Admitting: Physical Therapy

## 2021-05-07 ENCOUNTER — Telehealth (HOSPITAL_BASED_OUTPATIENT_CLINIC_OR_DEPARTMENT_OTHER): Payer: Self-pay

## 2021-05-07 ENCOUNTER — Other Ambulatory Visit: Payer: Self-pay

## 2021-05-07 ENCOUNTER — Ambulatory Visit (INDEPENDENT_AMBULATORY_CARE_PROVIDER_SITE_OTHER): Payer: BC Managed Care – PPO | Admitting: Physical Therapy

## 2021-05-07 ENCOUNTER — Encounter: Payer: Self-pay | Admitting: Orthopaedic Surgery

## 2021-05-07 ENCOUNTER — Ambulatory Visit: Payer: BC Managed Care – PPO | Admitting: Orthopaedic Surgery

## 2021-05-07 ENCOUNTER — Other Ambulatory Visit (HOSPITAL_COMMUNITY): Payer: Self-pay

## 2021-05-07 ENCOUNTER — Telehealth: Payer: Self-pay | Admitting: Physical Medicine and Rehabilitation

## 2021-05-07 DIAGNOSIS — M25512 Pain in left shoulder: Secondary | ICD-10-CM

## 2021-05-07 DIAGNOSIS — R293 Abnormal posture: Secondary | ICD-10-CM | POA: Diagnosis not present

## 2021-05-07 DIAGNOSIS — I152 Hypertension secondary to endocrine disorders: Secondary | ICD-10-CM

## 2021-05-07 DIAGNOSIS — E1165 Type 2 diabetes mellitus with hyperglycemia: Secondary | ICD-10-CM

## 2021-05-07 DIAGNOSIS — E1159 Type 2 diabetes mellitus with other circulatory complications: Secondary | ICD-10-CM

## 2021-05-07 DIAGNOSIS — R600 Localized edema: Secondary | ICD-10-CM | POA: Diagnosis not present

## 2021-05-07 DIAGNOSIS — G8929 Other chronic pain: Secondary | ICD-10-CM

## 2021-05-07 DIAGNOSIS — M6281 Muscle weakness (generalized): Secondary | ICD-10-CM | POA: Diagnosis not present

## 2021-05-07 DIAGNOSIS — E785 Hyperlipidemia, unspecified: Secondary | ICD-10-CM

## 2021-05-07 DIAGNOSIS — E1169 Type 2 diabetes mellitus with other specified complication: Secondary | ICD-10-CM

## 2021-05-07 DIAGNOSIS — Z9889 Other specified postprocedural states: Secondary | ICD-10-CM

## 2021-05-07 DIAGNOSIS — E559 Vitamin D deficiency, unspecified: Secondary | ICD-10-CM

## 2021-05-07 DIAGNOSIS — K760 Fatty (change of) liver, not elsewhere classified: Secondary | ICD-10-CM

## 2021-05-07 MED ORDER — TRULICITY 1.5 MG/0.5ML ~~LOC~~ SOAJ
1.5000 mg | SUBCUTANEOUS | 0 refills | Status: DC
  Filled 2021-05-07: qty 2, 28d supply, fill #0

## 2021-05-07 NOTE — Telephone Encounter (Signed)
Patient has discarded the two injection samples she received in the office due to improper injection. She requested to use the manufacturer coupon that was given to her in office to have a new RX filled Sent in order, confirmed with Lyndee Leo

## 2021-05-07 NOTE — Addendum Note (Signed)
Addended by: Precious Bard on: 05/07/2021 11:36 AM   Modules accepted: Orders

## 2021-05-07 NOTE — Progress Notes (Signed)
Post-Op Visit Note   Patient: Alexandra Norris           Date of Birth: January 22, 1976           MRN: [id number] Visit Date: 05/07/2021 PCP: Orma Render, NP   Assessment & Plan:  Chief Complaint:  Chief Complaint  Patient presents with   Left Shoulder - Pain, Follow-up   Visit Diagnoses:  1. S/P arthroscopy of left shoulder     Plan: Patient is almost 3 months status post left shoulder scope rotator cuff repair.  She is doing physical therapy twice a week.  She does not have much pain at baseline.  Left shoulder shows full healed surgical scars.  Active abduction to 100 degrees.  She has trouble reaching her head and mouth.  Forward flexion to 145 degrees with moderate pain.  Based on findings I feel that she is developed a mild to moderate adhesive capsulitis.  I would like to set her up with a cortisone injection with Dr. Ernestina Patches to help facilitate range of motion.  I do not feel that she should have the other shoulder operated on in 2 weeks therefore we will cancel her right shoulder surgery for now.  I would like to recheck her range of motion and function of the left shoulder in 6 weeks.  Follow-Up Instructions: Return in about 6 weeks (around 06/18/2021).   Orders:  No orders of the defined types were placed in this encounter.  No orders of the defined types were placed in this encounter.   Imaging: No results found.  PMFS History: Patient Active Problem List   Diagnosis Date Noted   Type 1 superior labral anterior-to-posterior (SLAP) tear of right shoulder 04/24/2021   Arthritis of right acromioclavicular joint 04/24/2021   Chronic left shoulder pain 04/22/2021   Hypertension associated with diabetes ([place]) 04/22/2021   Hyperlipidemia associated with type 2 diabetes mellitus ([place]) 04/22/2021   Dizziness 04/22/2021   Fatigue 04/22/2021   Nontraumatic tear of left supraspinatus tendon 02/12/2021   Impingement syndrome of left shoulder    Post-herpetic  polyneuropathy 11/14/2020   Shingles 11/14/2020   Neck pain 10/14/2020   Encounter to establish care 09/09/2020   Fatty liver disease, nonalcoholic 37/62/8315   Influenza vaccine needed 04/03/2020   Cervical disc disorder at C5-C6 level with radiculopathy 04/03/2020   Arthritis    Nasal obstruction 02/18/2016   DM2 (diabetes mellitus, type 2) ([place]) 10/21/2015   Vitamin D deficiency 09/25/2015   GASTROESOPHAGEAL REFLUX DISEASE 08/14/2009   Body mass index (BMI) 45.0-49.9, adult ([place]) 05/30/2007   Moderate persistent asthma with acute exacerbation 12/06/2006   Past Medical History:  Diagnosis Date   Anemia    Anemia    Anxiety    Arthritis    Asthma    Bilateral chronic knee pain 09/25/2015   Carpal tunnel syndrome    Depression    DEPRESSION, SITUATIONAL, PROLONGED 02/05/2009   Qualifier: Diagnosis of  By: Sherilyn Cooter  MD, Khary     Dyspnea    Elevated blood-pressure reading, without diagnosis of hypertension 06/17/2020   Endometriosis    History of blood transfusion 1999   w/vaginal delivery   History of bronchitis    "I've stayed in hospital 2 X w/bronchitis"   Left carpal tunnel syndrome 07/11/2019   Migraine    Pneumonia    PONV (postoperative nausea and vomiting)    Pre-diabetes 10/03/2019   A1C 6.4   Right carpal tunnel syndrome 07/11/2019   S/P carpal  tunnel release 08/30/2019   Seasonal allergies 10/08/2016    Family History  Problem Relation Age of Onset   Diabetes Mother    Osteoarthritis Mother    Hypertension Mother    Hyperlipidemia Mother    Arthritis Mother     Past Surgical History:  Procedure Laterality Date   ABDOMINAL HYSTERECTOMY  10/17/2019   APPENDECTOMY     CARPAL TUNNEL RELEASE Right 07/19/2019   Procedure: RIGHT CARPAL TUNNEL RELEASE;  Surgeon: Leandrew Koyanagi, MD;  Location: [place];  Service: Orthopedics;  Laterality: Right;   CARPAL TUNNEL RELEASE Left 09/13/2019   Procedure: LEFT CARPAL TUNNEL RELEASE;  Surgeon: Leandrew Koyanagi, MD;   Location: [place];  Service: Orthopedics;  Laterality: Left;  Bier block   [place]; 2008   CHOLECYSTECTOMY  2011   SHOULDER ARTHROSCOPY WITH DISTAL CLAVICLE RESECTION  02/12/2021   Procedure: SHOULDER ARTHROSCOPY WITH DISTAL CLAVICLE RESECTION;  Surgeon: Leandrew Koyanagi, MD;  Location: [place];  Service: Orthopedics;;   SHOULDER ARTHROSCOPY WITH ROTATOR CUFF REPAIR AND SUBACROMIAL DECOMPRESSION Left 02/12/2021   Procedure: LEFT SHOULDER ARTHROSCOPY WITH ROTATOR CUFF REPAIR, SUBACROMIAL DECOMPRESSION, EXTENSIVE DEBRIDEMENT;  Surgeon: Leandrew Koyanagi, MD;  Location: [place];  Service: Orthopedics;  Laterality: Left;   TONSILLECTOMY     tonsilletomy     TUBAL LIGATION  04/2007   VAGINAL HYSTERECTOMY Bilateral 10/17/2019   Procedure: HYSTERECTOMY VAGINAL WITH SALPINGECTOMY;  Surgeon: Chancy Milroy, MD;  Location: [place];  Service: Gynecology;  Laterality: Bilateral;   Social History   Occupational History   Not on file  Tobacco Use   Smoking status: Never   Smokeless tobacco: Never  Vaping Use   Vaping Use: Never used  Substance and Sexual Activity   Alcohol use: No   Drug use: No   Sexual activity: Yes    Birth control/protection: Surgical

## 2021-05-07 NOTE — Therapy (Addendum)
[facility] [address] [place], [place], [address] Phone: [phone]   Fax:  [phone]  Physical Therapy Treatment  Patient Details  Name: Alexandra Norris MRN: [id number] Date of Birth: [birth year] Referring Provider (PT): Dwana Melena PA-C   Encounter Date: 05/07/2021   PT End of Session - 05/07/21 0915     Visit Number 13    Number of Visits 22    Date for PT Re-Evaluation 06/04/21    Authorization Type BCBS    Progress Note Due on Visit 20    PT Start Time 0850    PT Stop Time 0930    PT Time Calculation (min) 40 min    Activity Tolerance Patient tolerated treatment well    Behavior During Therapy [facility] for tasks assessed/performed             Past Medical History:  Diagnosis Date   Anemia    Anemia    Anxiety    Arthritis    Asthma    Bilateral chronic knee pain 09/25/2015   Carpal tunnel syndrome    Depression    DEPRESSION, SITUATIONAL, PROLONGED 02/05/2009   Qualifier: Diagnosis of  By: Sherilyn Cooter  MD, Hosie Poisson     Dyspnea    Elevated blood-pressure reading, without diagnosis of hypertension 06/17/2020   Endometriosis    History of blood transfusion 1999   w/vaginal delivery   History of bronchitis    "I've stayed in hospital 2 X w/bronchitis"   Left carpal tunnel syndrome 07/11/2019   Migraine    Pneumonia    PONV (postoperative nausea and vomiting)    Pre-diabetes 10/03/2019   A1C 6.4   Right carpal tunnel syndrome 07/11/2019   S/P carpal tunnel release 08/30/2019   Seasonal allergies 10/08/2016    Past Surgical History:  Procedure Laterality Date   ABDOMINAL HYSTERECTOMY  10/17/2019   APPENDECTOMY     CARPAL TUNNEL RELEASE Right 07/19/2019   Procedure: RIGHT CARPAL TUNNEL RELEASE;  Surgeon: Leandrew Koyanagi, MD;  Location: [place];  Service: Orthopedics;  Laterality: Right;   CARPAL TUNNEL RELEASE Left 09/13/2019   Procedure: LEFT CARPAL TUNNEL RELEASE;  Surgeon: Leandrew Koyanagi, MD;   Location: [place];  Service: Orthopedics;  Laterality: Left;  Bier block   [place]; 2008   CHOLECYSTECTOMY  2011   SHOULDER ARTHROSCOPY WITH DISTAL CLAVICLE RESECTION  02/12/2021   Procedure: SHOULDER ARTHROSCOPY WITH DISTAL CLAVICLE RESECTION;  Surgeon: Leandrew Koyanagi, MD;  Location: [place];  Service: Orthopedics;;   SHOULDER ARTHROSCOPY WITH ROTATOR CUFF REPAIR AND SUBACROMIAL DECOMPRESSION Left 02/12/2021   Procedure: LEFT SHOULDER ARTHROSCOPY WITH ROTATOR CUFF REPAIR, SUBACROMIAL DECOMPRESSION, EXTENSIVE DEBRIDEMENT;  Surgeon: Leandrew Koyanagi, MD;  Location: [place];  Service: Orthopedics;  Laterality: Left;   TONSILLECTOMY     tonsilletomy     TUBAL LIGATION  04/2007   VAGINAL HYSTERECTOMY Bilateral 10/17/2019   Procedure: HYSTERECTOMY VAGINAL WITH SALPINGECTOMY;  Surgeon: Chancy Milroy, MD;  Location: [place];  Service: Gynecology;  Laterality: Bilateral;    There were no vitals filed for this visit.   Subjective Assessment - 05/07/21 0905     Subjective Pt reporting 1-2/10 pain in her left shoulder.    Currently in Pain? Yes    Pain Score 2     Pain Location Shoulder    Pain Orientation Left    Pain Descriptors / Indicators Sore    Pain Type Surgical pain  Pain Onset More than a month ago                [facility] PT Assessment - 05/07/21 0001       Assessment   Medical Diagnosis Z98.890 (ICD-10-CM) - S/P arthroscopy of left shoulder    Referring Provider (PT) Dwana Melena PA-C    Onset Date/Surgical Date 02/12/21      AROM   Overall AROM Comments measured in supine, IR and ER measured with shoulder abd 45 degrees    Left Shoulder Flexion 144 Degrees   pain at end range   Left Shoulder ABduction 130 Degrees   pain at end range   Left Shoulder Internal Rotation 70 Degrees   pain at end range   Left Shoulder External Rotation 48 Degrees   pain at end ranges     PROM   Overall PROM Comments painful  with end ranges, measured in supine, IR and ER measured with shoulder abd to 45 degrees    Left Shoulder Flexion 150 Degrees    Left Shoulder ABduction 135 Degrees    Left Shoulder Internal Rotation 70 Degrees    Left Shoulder External Rotation 50 Degrees                           [facility] Adult PT Treatment/Exercise - 05/07/21 0001       Shoulder Exercises: Standing   External Rotation Left   3 x 15   Theraband Level (Shoulder External Rotation) Level 3 (Green)   reduced from last time due to rep increase   Internal Rotation Left   3 x 15   Theraband Level (Shoulder Internal Rotation) Level 3 (Green)   reduced from last time due to rep increase   ABduction Strengthening;Both;10 reps;Weights    Shoulder ABduction Weight (lbs) 2    Extension Both   3 x 15   Theraband Level (Shoulder Extension) Level 4 (Blue)    Other Standing Exercises wall push up c SA press hold 3 x 10, standing circles c ball at wall 90 degrees 30x cw, ccw    Other Standing Exercises red physioball up the wall for flexion holding end range 5 seconds x 15      Shoulder Exercises: ROM/Strengthening   UBE (Upper Arm Bike) Lvl 3.7 4 mins fwd/back each way                       PT Short Term Goals - 05/07/21 0910       PT SHORT TERM GOAL #1   Title Patient will demonstrate independent use of home exercise program to maintain progress from in clinic treatments.    Status Achieved               PT Long Term Goals - 05/07/21 0910       PT LONG TERM GOAL #1   Title Patient will demonstrate/report pain at worst less than or equal to 2/10 to facilitate minimal limitation in daily activity secondary to pain symptoms.    Status On-going      PT LONG TERM GOAL #2   Title Patient will demonstrate independent use of home exercise program to facilitate ability to maintain/progress functional gains from skilled physical therapy services.    Status On-going      PT LONG TERM GOAL #3    Title Patient will demonstrate return to work/recreational activity at previous level of function without limitations secondary  due to condition    Status On-going      PT LONG TERM GOAL #4   Title Pt. will demonstrate FOTO outcome > or = 63% to indicated reduced disability due to condition.    Status On-going      PT LONG TERM GOAL #5   Title Patient will demonstrate Lt [place] joint mobility WFL to facilitate usual self care, dressing, reaching overhead at PLOF s limitation due to symptoms.    Status On-going      PT LONG TERM GOAL #6   Title Patient will demonstrate Lt UE MMT 4/5 throughout to facilitate usual lifting, carrying in functional activity to PLOF s limitation.    Status On-going                   Plan - 05/07/21 0908     Clinical Impression Statement Pt tolerating all exercises well today with focusing on left shoulder strength and ROM. Continue skilled PT to maximize pt's function.    Personal Factors and Comorbidities Comorbidity 3+    Comorbidities Depression, anxiety, history of Carpal tunnel syndrome    Examination-Activity Limitations Sleep;Sit;Bed Mobility;Bathing;Carry;Caring for Others;Dressing;Hygiene/Grooming;Lift;Reach Overhead    Examination-Participation Restrictions Occupation;Meal Prep;Laundry;Interpersonal Relationship;Driving;Community Activity;Cleaning    Stability/Clinical Decision Making Stable/Uncomplicated    Rehab Potential Good    PT Frequency 2x / week    PT Duration 6 weeks    PT Treatment/Interventions ADLs/Self Care Home Management;Cryotherapy;Electrical Stimulation;Iontophoresis 4mg /ml Dexamethasone;Moist Heat;Therapeutic exercise;Therapeutic activities;Functional mobility training;DME Instruction;Ultrasound;Neuromuscular re-education;Patient/family education;Passive range of motion;Spinal Manipulations;Joint Manipulations;Dry needling;Taping;Vasopneumatic Device;Manual techniques    PT Next Visit Plan Lt shoulder progressive  strengthening.    PT Home Exercise Plan [email or web address]    Consulted and Agree with Plan of Care Patient             Patient will benefit from skilled therapeutic intervention in order to improve the following deficits and impairments:  Hypomobility, Increased edema, Decreased activity tolerance, Decreased strength, Impaired UE functional use, Pain, Difficulty walking, Decreased mobility, Decreased range of motion, Impaired perceived functional ability, Improper body mechanics, Postural dysfunction, Impaired flexibility, Decreased coordination  Visit Diagnosis: Chronic left shoulder pain  Muscle weakness (generalized)  Abnormal posture  Localized edema     Problem List Patient Active Problem List   Diagnosis Date Noted   Type 1 superior labral anterior-to-posterior (SLAP) tear of right shoulder 04/24/2021   Arthritis of right acromioclavicular joint 04/24/2021   Chronic left shoulder pain 04/22/2021   Hypertension associated with diabetes ([place]) 04/22/2021   Hyperlipidemia associated with type 2 diabetes mellitus ([place]) 04/22/2021   Dizziness 04/22/2021   Fatigue 04/22/2021   Nontraumatic tear of left supraspinatus tendon 02/12/2021   Impingement syndrome of left shoulder    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   Shingles 11/14/2020   Neck pain 10/14/2020   Encounter to establish care 09/09/2020   Fatty liver disease, nonalcoholic 77/41/2878   Influenza vaccine needed 04/03/2020   Cervical disc disorder at C5-C6 level with radiculopathy 04/03/2020   Arthritis    Nasal obstruction 02/18/2016   DM2 (diabetes mellitus, type 2) ([place]) 10/21/2015   Vitamin D deficiency 09/25/2015   GASTROESOPHAGEAL REFLUX DISEASE 08/14/2009   Body mass index (BMI) 45.0-49.9, adult ([place]) 05/30/2007   Moderate persistent asthma with acute exacerbation 12/06/2006    Oretha Caprice, PT, MPT 05/07/2021, 9:35 AM  [facility] Physical Therapy [address] [place], [place],  [address] Phone: [phone]   Fax:  [phone]  Name: Alexandra Norris MRN: [id number] Date of Birth: [birth year]

## 2021-05-07 NOTE — Telephone Encounter (Signed)
Called pt to get scheduled with [facility]. LM

## 2021-05-12 ENCOUNTER — Ambulatory Visit (INDEPENDENT_AMBULATORY_CARE_PROVIDER_SITE_OTHER): Payer: BC Managed Care – PPO | Admitting: Rehabilitative and Restorative Service Providers"

## 2021-05-12 ENCOUNTER — Other Ambulatory Visit: Payer: Self-pay

## 2021-05-12 ENCOUNTER — Encounter: Payer: Self-pay | Admitting: Rehabilitative and Restorative Service Providers"

## 2021-05-12 DIAGNOSIS — M6281 Muscle weakness (generalized): Secondary | ICD-10-CM

## 2021-05-12 DIAGNOSIS — M25512 Pain in left shoulder: Secondary | ICD-10-CM | POA: Diagnosis not present

## 2021-05-12 DIAGNOSIS — G8929 Other chronic pain: Secondary | ICD-10-CM

## 2021-05-12 DIAGNOSIS — R600 Localized edema: Secondary | ICD-10-CM

## 2021-05-12 DIAGNOSIS — R293 Abnormal posture: Secondary | ICD-10-CM

## 2021-05-12 NOTE — Patient Instructions (Signed)
Access Code: [email or web address] URL: [email or web address] Date: 05/12/2021 Prepared by: Scot Jun  Exercises Supine Shoulder Flexion Extension Full Range AROM (Mirrored) - 2 x daily - 7 x weekly - 3 sets - 10-15 reps Sidelying Shoulder Abduction Palm Forward (Mirrored) - 2 x daily - 7 x weekly - 3 sets - 10-15 reps Standing Shoulder Row with Anchored Resistance - 1 x daily - 7 x weekly - 3 sets - 10 reps Shoulder Extension with Resistance - 1 x daily - 7 x weekly - 3 sets - 10 reps Supine Shoulder Horizontal Abduction with Resistance - 1 x daily - 7 x weekly - 3 sets - 10 reps Shoulder External Rotation with Anchored Resistance - 2 x daily - 7 x weekly - 3 sets - 10 reps Shoulder Internal Rotation with Resistance (Mirrored) - 2 x daily - 7 x weekly - 3 sets - 10 reps Supine Shoulder External Rotation in 45 Degrees Abduction AAROM with [place] - 2-3 x daily - 7 x weekly - 1 sets - 5 reps - 30 hold Standing Shoulder External Rotation Stretch in Doorway - 2-3 x daily - 7 x weekly - 1 sets - 5 reps - 30 hold

## 2021-05-12 NOTE — Therapy (Signed)
[facility] [address] [place], [place], [address] Phone: [phone]   Fax:  [phone]  Physical Therapy Treatment  Patient Details  Name: Alexandra Norris MRN: [id number] Date of Birth: November 24, 1975 Referring Provider (PT): Dwana Melena PA-C   Encounter Date: 05/12/2021   PT End of Session - 05/12/21 0936     Visit Number 14    Number of Visits 22    Date for PT Re-Evaluation 06/04/21    Authorization Type BCBS    Progress Note Due on Visit 20    PT Start Time 0930    PT Stop Time 1009    PT Time Calculation (min) 39 min    Activity Tolerance Patient tolerated treatment well    Behavior During Therapy [facility] for tasks assessed/performed             Past Medical History:  Diagnosis Date   Anemia    Anemia    Anxiety    Arthritis    Asthma    Bilateral chronic knee pain 09/25/2015   Carpal tunnel syndrome    Depression    DEPRESSION, SITUATIONAL, PROLONGED 02/05/2009   Qualifier: Diagnosis of  By: Sherilyn Cooter  MD, Khary     Dyspnea    Elevated blood-pressure reading, without diagnosis of hypertension 06/17/2020   Endometriosis    History of blood transfusion 1999   w/vaginal delivery   History of bronchitis    "I've stayed in hospital 2 X w/bronchitis"   Left carpal tunnel syndrome 07/11/2019   Migraine    Pneumonia    PONV (postoperative nausea and vomiting)    Pre-diabetes 10/03/2019   A1C 6.4   Right carpal tunnel syndrome 07/11/2019   S/P carpal tunnel release 08/30/2019   Seasonal allergies 10/08/2016    Past Surgical History:  Procedure Laterality Date   ABDOMINAL HYSTERECTOMY  10/17/2019   APPENDECTOMY     CARPAL TUNNEL RELEASE Right 07/19/2019   Procedure: RIGHT CARPAL TUNNEL RELEASE;  Surgeon: Leandrew Koyanagi, MD;  Location: [place];  Service: Orthopedics;  Laterality: Right;   CARPAL TUNNEL RELEASE Left 09/13/2019   Procedure: LEFT CARPAL TUNNEL RELEASE;  Surgeon: Leandrew Koyanagi, MD;   Location: [place];  Service: Orthopedics;  Laterality: Left;  Bier block   [place]; 2008   CHOLECYSTECTOMY  2011   SHOULDER ARTHROSCOPY WITH DISTAL CLAVICLE RESECTION  02/12/2021   Procedure: SHOULDER ARTHROSCOPY WITH DISTAL CLAVICLE RESECTION;  Surgeon: Leandrew Koyanagi, MD;  Location: [place];  Service: Orthopedics;;   SHOULDER ARTHROSCOPY WITH ROTATOR CUFF REPAIR AND SUBACROMIAL DECOMPRESSION Left 02/12/2021   Procedure: LEFT SHOULDER ARTHROSCOPY WITH ROTATOR CUFF REPAIR, SUBACROMIAL DECOMPRESSION, EXTENSIVE DEBRIDEMENT;  Surgeon: Leandrew Koyanagi, MD;  Location: [place];  Service: Orthopedics;  Laterality: Left;   TONSILLECTOMY     tonsilletomy     TUBAL LIGATION  04/2007   VAGINAL HYSTERECTOMY Bilateral 10/17/2019   Procedure: HYSTERECTOMY VAGINAL WITH SALPINGECTOMY;  Surgeon: Chancy Milroy, MD;  Location: [place];  Service: Gynecology;  Laterality: Bilateral;    There were no vitals filed for this visit.   Subjective Assessment - 05/12/21 0937     Subjective Pt. indicated no pain at rest upon arrival today.  Pt. indicated her Rt shoulder surgery was postponed due to Lt shoulder treatment plans.  Injection to be scheduled for Lt shoulder.    Currently in Pain? No/denies    Pain Score 0-No pain  Pain Onset More than a month ago                               [facility] Adult PT Treatment/Exercise - 05/12/21 0001       Shoulder Exercises: Standing   Flexion Both;Other (comment)   2 x 15 full range   Other Standing Exercises standing 2 lb ball 90 deg flexion circles cw, ccw 2 x 20 each way      Shoulder Exercises: Pulleys   Flexion 3 minutes    ABduction 3 minutes      Shoulder Exercises: Stretch   Other Shoulder Stretches wand ER stretch 30 sec holds x 5      Manual Therapy   Manual therapy comments Lt shoulder inferior glides in flexion, abduction.  Mobilization c movement c ER to Lt shoulder.   Contract/relax techniques for ER mobility gains in 60 deg abduction                     PT Education - 05/12/21 0955     Education Details ER stretching added    Person(s) Educated Patient    Methods Demonstration;Explanation;Verbal cues;Handout    Comprehension Verbalized understanding;Returned demonstration              PT Short Term Goals - 05/07/21 0910       PT SHORT TERM GOAL #1   Title Patient will demonstrate independent use of home exercise program to maintain progress from in clinic treatments.    Status Achieved               PT Long Term Goals - 05/07/21 0910       PT LONG TERM GOAL #1   Title Patient will demonstrate/report pain at worst less than or equal to 2/10 to facilitate minimal limitation in daily activity secondary to pain symptoms.    Status On-going      PT LONG TERM GOAL #2   Title Patient will demonstrate independent use of home exercise program to facilitate ability to maintain/progress functional gains from skilled physical therapy services.    Status On-going      PT LONG TERM GOAL #3   Title Patient will demonstrate return to work/recreational activity at previous level of function without limitations secondary due to condition    Status On-going      PT LONG TERM GOAL #4   Title Pt. will demonstrate FOTO outcome > or = 63% to indicated reduced disability due to condition.    Status On-going      PT LONG TERM GOAL #5   Title Patient will demonstrate Lt [place] joint mobility WFL to facilitate usual self care, dressing, reaching overhead at PLOF s limitation due to symptoms.    Status On-going      PT LONG TERM GOAL #6   Title Patient will demonstrate Lt UE MMT 4/5 throughout to facilitate usual lifting, carrying in functional activity to PLOF s limitation.    Status On-going                   Plan - 05/12/21 5329     Clinical Impression Statement ER tightness restriction most evident - most likely combination  of capsular and myofascial restriction.  Emphasis on additional ER stretching for home with added HEP. Continued skilled PT services to benefit Pt. presentation.    Personal Factors and Comorbidities Comorbidity 3+    Comorbidities  Depression, anxiety, history of Carpal tunnel syndrome    Examination-Activity Limitations Sleep;Sit;Bed Mobility;Bathing;Carry;Caring for Others;Dressing;Hygiene/Grooming;Lift;Reach Overhead    Examination-Participation Restrictions Occupation;Meal Prep;Laundry;Interpersonal Relationship;Driving;Community Activity;Cleaning    Stability/Clinical Decision Making Stable/Uncomplicated    Rehab Potential Good    PT Frequency 2x / week    PT Duration 6 weeks    PT Treatment/Interventions ADLs/Self Care Home Management;Cryotherapy;Electrical Stimulation;Iontophoresis 4mg /ml Dexamethasone;Moist Heat;Therapeutic exercise;Therapeutic activities;Functional mobility training;DME Instruction;Ultrasound;Neuromuscular re-education;Patient/family education;Passive range of motion;Spinal Manipulations;Joint Manipulations;Dry needling;Taping;Vasopneumatic Device;Manual techniques    PT Next Visit Plan ER contract/relax and progressive stretching, general strengthening program    PT Home Exercise Plan [phone]    Consulted and Agree with Plan of Care Patient             Patient will benefit from skilled therapeutic intervention in order to improve the following deficits and impairments:  Hypomobility, Increased edema, Decreased activity tolerance, Decreased strength, Impaired UE functional use, Pain, Difficulty walking, Decreased mobility, Decreased range of motion, Impaired perceived functional ability, Improper body mechanics, Postural dysfunction, Impaired flexibility, Decreased coordination  Visit Diagnosis: Chronic left shoulder pain  Muscle weakness (generalized)  Abnormal posture  Localized edema     Problem List Patient Active Problem List   Diagnosis Date Noted    Type 1 superior labral anterior-to-posterior (SLAP) tear of right shoulder 04/24/2021   Arthritis of right acromioclavicular joint 04/24/2021   Chronic left shoulder pain 04/22/2021   Hypertension associated with diabetes ([place]) 04/22/2021   Hyperlipidemia associated with type 2 diabetes mellitus ([place]) 04/22/2021   Dizziness 04/22/2021   Fatigue 04/22/2021   Nontraumatic tear of left supraspinatus tendon 02/12/2021   Impingement syndrome of left shoulder    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   Shingles 11/14/2020   Neck pain 10/14/2020   Encounter to establish care 09/09/2020   Fatty liver disease, nonalcoholic 37/03/6268   Influenza vaccine needed 04/03/2020   Cervical disc disorder at C5-C6 level with radiculopathy 04/03/2020   Arthritis    Nasal obstruction 02/18/2016   DM2 (diabetes mellitus, type 2) ([place]) 10/21/2015   Vitamin D deficiency 09/25/2015   GASTROESOPHAGEAL REFLUX DISEASE 08/14/2009   Body mass index (BMI) 45.0-49.9, adult ([facility]) 05/30/2007   Moderate persistent asthma with acute exacerbation 12/06/2006   Scot Jun, PT, DPT, OCS, ATC 05/12/21  10:07 AM    [facility] [address] [place], [place], [address] Phone: [phone]   Fax:  [phone]  Name: Trenisha Lafavor MRN: [id number] Date of Birth: 07/04/1975

## 2021-05-14 ENCOUNTER — Other Ambulatory Visit: Payer: Self-pay

## 2021-05-14 ENCOUNTER — Encounter: Payer: Self-pay | Admitting: Rehabilitative and Restorative Service Providers"

## 2021-05-14 ENCOUNTER — Ambulatory Visit (INDEPENDENT_AMBULATORY_CARE_PROVIDER_SITE_OTHER): Payer: BC Managed Care – PPO | Admitting: Rehabilitative and Restorative Service Providers"

## 2021-05-14 DIAGNOSIS — M25512 Pain in left shoulder: Secondary | ICD-10-CM | POA: Diagnosis not present

## 2021-05-14 DIAGNOSIS — R600 Localized edema: Secondary | ICD-10-CM

## 2021-05-14 DIAGNOSIS — M6281 Muscle weakness (generalized): Secondary | ICD-10-CM

## 2021-05-14 DIAGNOSIS — R293 Abnormal posture: Secondary | ICD-10-CM | POA: Diagnosis not present

## 2021-05-14 DIAGNOSIS — G8929 Other chronic pain: Secondary | ICD-10-CM

## 2021-05-14 NOTE — Therapy (Signed)
[facility] [address] [place], [place], [address] Phone: [phone]   Fax:  [phone]  Physical Therapy Treatment  Patient Details  Name: Alexandra Norris MRN: [id number] Date of Birth: 1976/02/02 Referring Provider (PT): Dwana Melena PA-C   Encounter Date: 05/14/2021   PT End of Session - 05/14/21 0941     Visit Number 15    Number of Visits 22    Date for PT Re-Evaluation 06/04/21    Authorization Type BCBS    Progress Note Due on Visit 20    PT Start Time 0936    PT Stop Time 1015    PT Time Calculation (min) 39 min    Activity Tolerance Patient tolerated treatment well    Behavior During Therapy [facility] for tasks assessed/performed             Past Medical History:  Diagnosis Date   Anemia    Anemia    Anxiety    Arthritis    Asthma    Bilateral chronic knee pain 09/25/2015   Carpal tunnel syndrome    Depression    DEPRESSION, SITUATIONAL, PROLONGED 02/05/2009   Qualifier: Diagnosis of  By: Sherilyn Cooter  MD, Hosie Poisson     Dyspnea    Elevated blood-pressure reading, without diagnosis of hypertension 06/17/2020   Endometriosis    History of blood transfusion 1999   w/vaginal delivery   History of bronchitis    "I've stayed in hospital 2 X w/bronchitis"   Left carpal tunnel syndrome 07/11/2019   Migraine    Pneumonia    PONV (postoperative nausea and vomiting)    Pre-diabetes 10/03/2019   A1C 6.4   Right carpal tunnel syndrome 07/11/2019   S/P carpal tunnel release 08/30/2019   Seasonal allergies 10/08/2016    Past Surgical History:  Procedure Laterality Date   ABDOMINAL HYSTERECTOMY  10/17/2019   APPENDECTOMY     CARPAL TUNNEL RELEASE Right 07/19/2019   Procedure: RIGHT CARPAL TUNNEL RELEASE;  Surgeon: Leandrew Koyanagi, MD;  Location: [place];  Service: Orthopedics;  Laterality: Right;   CARPAL TUNNEL RELEASE Left 09/13/2019   Procedure: LEFT CARPAL TUNNEL RELEASE;  Surgeon: Leandrew Koyanagi, MD;   Location: [place];  Service: Orthopedics;  Laterality: Left;  Bier block   [place]; 2008   CHOLECYSTECTOMY  2011   SHOULDER ARTHROSCOPY WITH DISTAL CLAVICLE RESECTION  02/12/2021   Procedure: SHOULDER ARTHROSCOPY WITH DISTAL CLAVICLE RESECTION;  Surgeon: Leandrew Koyanagi, MD;  Location: [place];  Service: Orthopedics;;   SHOULDER ARTHROSCOPY WITH ROTATOR CUFF REPAIR AND SUBACROMIAL DECOMPRESSION Left 02/12/2021   Procedure: LEFT SHOULDER ARTHROSCOPY WITH ROTATOR CUFF REPAIR, SUBACROMIAL DECOMPRESSION, EXTENSIVE DEBRIDEMENT;  Surgeon: Leandrew Koyanagi, MD;  Location: [place];  Service: Orthopedics;  Laterality: Left;   TONSILLECTOMY     tonsilletomy     TUBAL LIGATION  04/2007   VAGINAL HYSTERECTOMY Bilateral 10/17/2019   Procedure: HYSTERECTOMY VAGINAL WITH SALPINGECTOMY;  Surgeon: Chancy Milroy, MD;  Location: [place];  Service: Gynecology;  Laterality: Bilateral;    There were no vitals filed for this visit.   Subjective Assessment - 05/14/21 0940     Subjective Pt. reported feeling some soreness today but not pain.  Pt. confirmed no pain while doing exercises at home but felt like soreness might be related to her new stretches since they feel tight.    Currently in Pain? No/denies    Pain Score 0-No pain  Pain Onset More than a month ago                [facility] PT Assessment - 05/14/21 0001       Assessment   Medical Diagnosis Z98.890 (ICD-10-CM) - S/P arthroscopy of left shoulder    Referring Provider (PT) Dwana Melena PA-C    Onset Date/Surgical Date 02/12/21    Hand Dominance Right      AROM   Left Shoulder External Rotation 50 Degrees   in supine 60 deg abduction     PROM   Left Shoulder External Rotation 55 Degrees   in 60 deg abduction in supine                          [facility] Adult PT Treatment/Exercise - 05/14/21 0001       Shoulder Exercises: Supine   Protraction Left   2 x 10 2  sec hold   Protraction Weight (lbs) 3    Other Supine Exercises supine 90 deg flexion circles cw, ccw 3 lbs 25 x each way      Shoulder Exercises: Standing   Internal Rotation Left   3 x 10 c towel at side   Theraband Level (Shoulder Internal Rotation) Level 4 (Blue)    Flexion Both   3 x 10 full range   Shoulder Flexion Weight (lbs) 1    Extension Both   2 x 15   Theraband Level (Shoulder Extension) Level 4 (Blue)    Row Both   2 x 15   Theraband Level (Shoulder Row) Level 4 (Blue)      Shoulder Exercises: Pulleys   Flexion 3 minutes    ABduction 3 minutes      Shoulder Exercises: Stretch   Other Shoulder Stretches doorway ER stretch c arm at side 30 sec x 5 Lt      Manual Therapy   Manual therapy comments Lt shoulder contract/relax for ER gains in 45-60 deg abduction, Mobilization c movement for ER gains in same abduction.                       PT Short Term Goals - 05/07/21 0910       PT SHORT TERM GOAL #1   Title Patient will demonstrate independent use of home exercise program to maintain progress from in clinic treatments.    Status Achieved               PT Long Term Goals - 05/07/21 0910       PT LONG TERM GOAL #1   Title Patient will demonstrate/report pain at worst less than or equal to 2/10 to facilitate minimal limitation in daily activity secondary to pain symptoms.    Status On-going      PT LONG TERM GOAL #2   Title Patient will demonstrate independent use of home exercise program to facilitate ability to maintain/progress functional gains from skilled physical therapy services.    Status On-going      PT LONG TERM GOAL #3   Title Patient will demonstrate return to work/recreational activity at previous level of function without limitations secondary due to condition    Status On-going      PT LONG TERM GOAL #4   Title Pt. will demonstrate FOTO outcome > or = 63% to indicated reduced disability due to condition.    Status On-going       PT LONG TERM GOAL #  5   Title Patient will demonstrate Lt [place] joint mobility WFL to facilitate usual self care, dressing, reaching overhead at PLOF s limitation due to symptoms.    Status On-going      PT LONG TERM GOAL #6   Title Patient will demonstrate Lt UE MMT 4/5 throughout to facilitate usual lifting, carrying in functional activity to PLOF s limitation.    Status On-going                   Plan - 05/14/21 0954     Clinical Impression Statement Pt. demonstrated very positive response in ER mobility gains within clinic visit today, progression from 35-40 degrees passively to 55 when measured in supine 60 deg abduction following contract/relax techniques to Lt shoulder.  Pt. continued to show improvement in elevation mobility and strength against gravity. Continued skilled PT services indicated.    Personal Factors and Comorbidities Comorbidity 3+    Comorbidities Depression, anxiety, history of Carpal tunnel syndrome    Examination-Activity Limitations Sleep;Sit;Bed Mobility;Bathing;Carry;Caring for Others;Dressing;Hygiene/Grooming;Lift;Reach Overhead    Examination-Participation Restrictions Occupation;Meal Prep;Laundry;Interpersonal Relationship;Driving;Community Activity;Cleaning    Stability/Clinical Decision Making Stable/Uncomplicated    Rehab Potential Good    PT Frequency 2x / week    PT Duration 6 weeks    PT Treatment/Interventions ADLs/Self Care Home Management;Cryotherapy;Electrical Stimulation;Iontophoresis 4mg /ml Dexamethasone;Moist Heat;Therapeutic exercise;Therapeutic activities;Functional mobility training;DME Instruction;Ultrasound;Neuromuscular re-education;Patient/family education;Passive range of motion;Spinal Manipulations;Joint Manipulations;Dry needling;Taping;Vasopneumatic Device;Manual techniques    PT Next Visit Plan ER contract/relax beneficial, movement against gravity strengthening.    PT Home Exercise Plan [id number]    Consulted and Agree with  Plan of Care Patient             Patient will benefit from skilled therapeutic intervention in order to improve the following deficits and impairments:  Hypomobility, Increased edema, Decreased activity tolerance, Decreased strength, Impaired UE functional use, Pain, Difficulty walking, Decreased mobility, Decreased range of motion, Impaired perceived functional ability, Improper body mechanics, Postural dysfunction, Impaired flexibility, Decreased coordination  Visit Diagnosis: Chronic left shoulder pain  Muscle weakness (generalized)  Abnormal posture  Localized edema     Problem List Patient Active Problem List   Diagnosis Date Noted   Type 1 superior labral anterior-to-posterior (SLAP) tear of right shoulder 04/24/2021   Arthritis of right acromioclavicular joint 04/24/2021   Chronic left shoulder pain 04/22/2021   Hypertension associated with diabetes ([place]) 04/22/2021   Hyperlipidemia associated with type 2 diabetes mellitus ([place]) 04/22/2021   Dizziness 04/22/2021   Fatigue 04/22/2021   Nontraumatic tear of left supraspinatus tendon 02/12/2021   Impingement syndrome of left shoulder    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   Shingles 11/14/2020   Neck pain 10/14/2020   Encounter to establish care 09/09/2020   Fatty liver disease, nonalcoholic 10/62/6948   Influenza vaccine needed 04/03/2020   Cervical disc disorder at C5-C6 level with radiculopathy 04/03/2020   Arthritis    Nasal obstruction 02/18/2016   DM2 (diabetes mellitus, type 2) ([place]) 10/21/2015   Vitamin D deficiency 09/25/2015   GASTROESOPHAGEAL REFLUX DISEASE 08/14/2009   Body mass index (BMI) 45.0-49.9, adult ([place]) 05/30/2007   Moderate persistent asthma with acute exacerbation 12/06/2006    Scot Jun, PT, DPT, OCS, ATC 05/14/21  10:13 AM    [facility] [address] [place], [place], [address] Phone: [phone]   Fax:  [phone]  Name: Alexandra Norris MRN: [id number] Date of Birth: 1976/06/01

## 2021-05-19 ENCOUNTER — Encounter: Payer: BLUE CROSS/BLUE SHIELD | Admitting: Rehabilitative and Restorative Service Providers"

## 2021-05-20 ENCOUNTER — Other Ambulatory Visit (HOSPITAL_COMMUNITY): Payer: Self-pay

## 2021-05-20 ENCOUNTER — Ambulatory Visit (INDEPENDENT_AMBULATORY_CARE_PROVIDER_SITE_OTHER): Payer: BC Managed Care – PPO | Admitting: Nurse Practitioner

## 2021-05-20 ENCOUNTER — Other Ambulatory Visit: Payer: Self-pay

## 2021-05-20 ENCOUNTER — Encounter (HOSPITAL_BASED_OUTPATIENT_CLINIC_OR_DEPARTMENT_OTHER): Payer: Self-pay | Admitting: Nurse Practitioner

## 2021-05-20 VITALS — BP 128/82 | HR 93 | Ht 61.0 in | Wt 233.8 lb

## 2021-05-20 DIAGNOSIS — E1169 Type 2 diabetes mellitus with other specified complication: Secondary | ICD-10-CM | POA: Diagnosis not present

## 2021-05-20 DIAGNOSIS — I152 Hypertension secondary to endocrine disorders: Secondary | ICD-10-CM

## 2021-05-20 DIAGNOSIS — E1165 Type 2 diabetes mellitus with hyperglycemia: Secondary | ICD-10-CM

## 2021-05-20 DIAGNOSIS — E1159 Type 2 diabetes mellitus with other circulatory complications: Secondary | ICD-10-CM

## 2021-05-20 DIAGNOSIS — K760 Fatty (change of) liver, not elsewhere classified: Secondary | ICD-10-CM

## 2021-05-20 DIAGNOSIS — E785 Hyperlipidemia, unspecified: Secondary | ICD-10-CM

## 2021-05-20 MED ORDER — METFORMIN HCL 500 MG PO TABS
ORAL_TABLET | ORAL | 3 refills | Status: DC
  Filled 2021-05-20: qty 270, 90d supply, fill #0

## 2021-05-20 MED ORDER — ATORVASTATIN CALCIUM 20 MG PO TABS
20.0000 mg | ORAL_TABLET | Freq: Every day | ORAL | 3 refills | Status: DC
  Filled 2021-05-20: qty 90, 90d supply, fill #0

## 2021-05-20 MED ORDER — TRULICITY 1.5 MG/0.5ML ~~LOC~~ SOAJ
1.5000 mg | SUBCUTANEOUS | 6 refills | Status: DC
  Filled 2021-05-20: qty 2, 28d supply, fill #0
  Filled 2021-07-04: qty 2, 28d supply, fill #1

## 2021-05-20 NOTE — Assessment & Plan Note (Signed)
She is doing a fantastic job with diet and activity changes and taking her medication.  It is too Alexandra Norris to check A1c today, but average BG readings show excellent control with the changes made so far Encouraged patient to continue with daily monitoring up to three times a day, continue to take medications, and to continue diet and exercise management We will plan to recheck A1c in 3 months.

## 2021-05-20 NOTE — Assessment & Plan Note (Signed)
BP stable at 128/82 today No red flags today Continue current medications Will plan for labs in 3 months

## 2021-05-20 NOTE — Progress Notes (Signed)
Established Patient Office Visit  Subjective:  Patient ID: Alexandra Norris, female    DOB: March 26, 1976  Age: 45 y.o. MRN: [id number]  CC:  Chief Complaint  Patient presents with   Follow-up    Patient is here for follow up of diabetes, she is doing much better. Patient is doing finger sticks blood sugar is running 117 daily. She has lost 7 pds since her last visit. She is feeling better. She needs refills on Metformin, trulicity and lipitor.     HPI Alexandra Norris presents for f/u for diabetes.  At her last visit, she had been off of her diabetes medications and her blood sugars were out of control with readings in the upper 400's and A1c of 12%. Started back on metformin and atorvastatin and new start of Trulicity with CGM.  Unfortunately, she was unable to keep the CGM attached to her arm and has converted to finger sticks for BG monitoring.   Since her last visit she has titrated up to 9.4BS of Trulicity and has been closely monitoring her diet and exercising.  Her average blood sugars in the past 2 weeks have been between 90-260.  She has been eating smaller portions and cooking without carbohydrates.  She reports having a friend who has diabetes and she has given her tips on how to eat better and avoid excess carbohydrates.  She reports she feels so much better and she has lost 7 pounds.   She is not having fatigue, muscle pain, increased hunger or thirst, low blood sugars, dizziness, or palpitations.   Past Medical History:  Diagnosis Date   Anemia    Anemia    Anxiety    Arthritis    Asthma    Bilateral chronic knee pain 09/25/2015   Carpal tunnel syndrome    Depression    DEPRESSION, SITUATIONAL, PROLONGED 02/05/2009   Qualifier: Diagnosis of  By: Sherilyn Cooter  MD, Khary     Dizziness 04/22/2021   Dyspnea    Elevated blood-pressure reading, without diagnosis of hypertension 06/17/2020   Endometriosis    Fatigue 04/22/2021   History of blood  transfusion 1999   w/vaginal delivery   History of bronchitis    "I've stayed in hospital 2 X w/bronchitis"   Impingement syndrome of left shoulder    Influenza vaccine needed 04/03/2020   Left carpal tunnel syndrome 07/11/2019   Migraine    Neck pain 10/14/2020   Pneumonia    PONV (postoperative nausea and vomiting)    Pre-diabetes 10/03/2019   A1C 6.4   Right carpal tunnel syndrome 07/11/2019   S/P carpal tunnel release 08/30/2019   Seasonal allergies 10/08/2016   Shingles 11/14/2020    Past Surgical History:  Procedure Laterality Date   ABDOMINAL HYSTERECTOMY  10/17/2019   APPENDECTOMY     CARPAL TUNNEL RELEASE Right 07/19/2019   Procedure: RIGHT CARPAL TUNNEL RELEASE;  Surgeon: Leandrew Koyanagi, MD;  Location: [place];  Service: Orthopedics;  Laterality: Right;   CARPAL TUNNEL RELEASE Left 09/13/2019   Procedure: LEFT CARPAL TUNNEL RELEASE;  Surgeon: Leandrew Koyanagi, MD;  Location: [place];  Service: Orthopedics;  Laterality: Left;  Bier block   CESAREAN SECTION  1997; 2008   CHOLECYSTECTOMY  2011   SHOULDER ARTHROSCOPY WITH DISTAL CLAVICLE RESECTION  02/12/2021   Procedure: SHOULDER ARTHROSCOPY WITH DISTAL CLAVICLE RESECTION;  Surgeon: Leandrew Koyanagi, MD;  Location: [place];  Service: Orthopedics;;   SHOULDER ARTHROSCOPY WITH ROTATOR CUFF  REPAIR AND SUBACROMIAL DECOMPRESSION Left 02/12/2021   Procedure: LEFT SHOULDER ARTHROSCOPY WITH ROTATOR CUFF REPAIR, SUBACROMIAL DECOMPRESSION, EXTENSIVE DEBRIDEMENT;  Surgeon: Leandrew Koyanagi, MD;  Location: [place];  Service: Orthopedics;  Laterality: Left;   TONSILLECTOMY     tonsilletomy     TUBAL LIGATION  04/2007   VAGINAL HYSTERECTOMY Bilateral 10/17/2019   Procedure: HYSTERECTOMY VAGINAL WITH SALPINGECTOMY;  Surgeon: Chancy Milroy, MD;  Location: [place];  Service: Gynecology;  Laterality: Bilateral;    Family History  Problem Relation Age of Onset   Diabetes Mother     Osteoarthritis Mother    Hypertension Mother    Hyperlipidemia Mother    Arthritis Mother     Social History   Socioeconomic History   Marital status: Married    Spouse name: Not on file   Number of children: 3   Years of education: Not on file   Highest education level: 12th grade  Occupational History   Not on file  Tobacco Use   Smoking status: Never   Smokeless tobacco: Never  Vaping Use   Vaping Use: Never used  Substance and Sexual Activity   Alcohol use: No   Drug use: No   Sexual activity: Yes    Birth control/protection: Surgical  Other Topics Concern   Not on file  Social History Narrative   Not on file   Social Determinants of Health   Financial Resource Strain: Not on file  Food Insecurity: Not on file  Transportation Needs: Not on file  Physical Activity: Not on file  Stress: Not on file  Social Connections: Not on file  Intimate Partner Violence: Not on file    Outpatient Medications Prior to Visit  Medication Sig Dispense Refill   albuterol (VENTOLIN HFA) 108 (90 Base) MCG/ACT inhaler Inhale 2 puffs into the lungs in the morning and at bedtime. 18 g 2   Blood Glucose Monitoring Suppl (CONTOUR NEXT MONITOR) w/Device KIT USE SEGUN LO INDICADO 3 VECES AL DIA ANTES DE LAS COMIDAS 1 kit 0   COVID-19 mRNA bivalent vaccine, Pfizer, (PFIZER COVID-19 VAC BIVALENT) injection Inject into the muscle. 0.3 mL 0   gabapentin (NEURONTIN) 100 MG capsule Take 1 tab (165m) up to every 8 hours during the day as needed for pain.  Take 3 tab (305m at bedtime as needed for pain. 150 capsule 3   glucose blood test strip USE SEGUN LO INDICADO 3 VECES AL DIA ANTES DE LAS COMIDAS (Patient taking differently: USE SEGUN LO INDICADO 3 VECES AL DIA ANTES DE LAS COMIDAS) 100 strip 12   lisinopril (ZESTRIL) 10 MG tablet Take 1 tablet (10 mg total) by mouth daily. 30 tablet 6   meloxicam (MOBIC) 7.5 MG tablet Take 1-2 tablets (7.5-15 mg total) by mouth daily as needed for shoudler  pain. Max of 2 daily. T[place] 1-2 tabletas cada dia para el dolor del hombro. No mas de 2 tabletas cada 24 horas 60 tablet 3   Microlet Lancets MISC USE TO CHECK BLOOD SUGAR 3 VECES AL DIA 100 each 2   Multiple Vitamin (MULTIVITAMIN WITH MINERALS) TABS tablet Take 1 tablet by mouth daily in the afternoon.     ondansetron (ZOFRAN) 4 MG tablet Take 1 tablet (4 mg total) by mouth every 8 (eight) hours as needed for nausea or vomiting. 40 tablet 0   TRUEplus Lancets 28G MISC USE SEGUN LO INDICADO 3 VECES AL DIA ANTES DE LAS COMIDAS 100 each 12   acetaminophen (TYLENOL) 325  MG tablet Take 650 mg by mouth every 6 (six) hours as needed.     atorvastatin (LIPITOR) 20 MG tablet Take 1 tablet (20 mg total) by mouth at bedtime. 30 tablet 6   Blood Glucose Monitoring Suppl (TRUE METRIX METER) DEVI 1 each by Does not apply route 3 (three) times daily before meals. 1 each 0   Continuous Blood Gluc Sensor (FREESTYLE LIBRE 3 SENSOR) MISC Place 1 sensor on the skin every 14 days. Use to check glucose continuously 2 each 11   Dulaglutide (TRULICITY) 1.5 NI/7.7OE SOPN Inject 1.5 mg into the skin once a week. Do not start until finished with the 0.26m dose for 4 weeks. 2 mL 0   glucose blood (TRUE METRIX BLOOD GLUCOSE TEST) test strip Use 3 times daily before meals 100 each 12   metFORMIN (GLUCOPHAGE) 500 MG tablet Take 1 tablet (500 mg total) by mouth daily with breakfast AND 2 tablets (1,000 mg total) with dinner 90 tablet 6   valACYclovir (VALTREX) 1000 MG tablet Take 1 tablet (1,000 mg total) by mouth 3 (three) times daily for 7 days. 21 tablet 0   No facility-administered medications prior to visit.    No Known Allergies  ROS Review of Systems All review of systems negative except what is listed in the HPI    Objective:    Physical Exam Vitals and nursing note reviewed.  Constitutional:      Appearance: Normal appearance.  HENT:     Head: Normocephalic.  Eyes:     Extraocular Movements: Extraocular  movements intact.     Conjunctiva/sclera: Conjunctivae normal.     Pupils: Pupils are equal, round, and reactive to light.  Neck:     Vascular: No carotid bruit.  Cardiovascular:     Rate and Rhythm: Normal rate and regular rhythm.     Pulses: Normal pulses.     Heart sounds: Normal heart sounds.  Pulmonary:     Effort: Pulmonary effort is normal.     Breath sounds: Normal breath sounds.  Musculoskeletal:     Cervical back: Normal range of motion.     Right lower leg: No edema.     Left lower leg: No edema.  Skin:    General: Skin is warm and dry.     Capillary Refill: Capillary refill takes less than 2 seconds.  Neurological:     General: No focal deficit present.     Mental Status: She is alert and oriented to person, place, and time.  Psychiatric:        Mood and Affect: Mood normal.        Behavior: Behavior normal.        Thought Content: Thought content normal.        Judgment: Judgment normal.    BP 128/82    Pulse 93    Ht 5' 1"  (1.549 m)    Wt 233 lb 12.8 oz (106.1 kg)    LMP 09/20/2019 (Exact Date)    SpO2 94%    BMI 44.18 kg/m  Wt Readings from Last 3 Encounters:  05/20/21 233 lb 12.8 oz (106.1 kg)  04/23/21 240 lb (108.9 kg)  04/22/21 240 lb (108.9 kg)     Health Maintenance Due  Topic Date Due   Pneumococcal Vaccine 160610Years old (1 - PCV) Never done   OPHTHALMOLOGY EXAM  01/09/2021    There are no preventive care reminders to display for this patient.  Lab Results  Component Value Date  TSH 2.685 07/07/2014   Lab Results  Component Value Date   WBC 10.0 04/22/2021   HGB 12.6 04/22/2021   HCT 42.1 04/22/2021   MCV 80 04/22/2021   PLT 263 04/22/2021   Lab Results  Component Value Date   NA 132 (L) 04/22/2021   K 4.6 04/22/2021   CO2 22 04/22/2021   GLUCOSE 455 (H) 04/22/2021   BUN 8 04/22/2021   CREATININE 0.83 04/22/2021   BILITOT 0.6 04/22/2021   ALKPHOS 177 (H) 04/22/2021   AST 64 (H) 04/22/2021   ALT 76 (H) 04/22/2021   PROT 7.2  04/22/2021   ALBUMIN 4.3 04/22/2021   CALCIUM 9.0 04/22/2021   ANIONGAP 7 02/04/2021   EGFR 89 04/22/2021   Lab Results  Component Value Date   CHOL 202 (H) 04/22/2021   Lab Results  Component Value Date   HDL 45 04/22/2021   Lab Results  Component Value Date   LDLCALC 113 (H) 04/22/2021   Lab Results  Component Value Date   TRIG 252 (H) 04/22/2021   Lab Results  Component Value Date   CHOLHDL 4.5 (H) 04/22/2021   Lab Results  Component Value Date   HGBA1C 12.0 (H) 04/22/2021      Assessment & Plan:   Problem List Items Addressed This Visit     DM2 (diabetes mellitus, type 2) (Uncertain) - Primary    She is doing a fantastic job with diet and activity changes and taking her medication.  It is too Jordie Schreur to check A1c today, but average BG readings show excellent control with the changes made so far Encouraged patient to continue with daily monitoring up to three times a day, continue to take medications, and to continue diet and exercise management We will plan to recheck A1c in 3 months.       Relevant Medications   Dulaglutide (TRULICITY) 1.5 VX/4.8AX SOPN   metFORMIN (GLUCOPHAGE) 500 MG tablet   atorvastatin (LIPITOR) 20 MG tablet   Fatty liver disease, nonalcoholic    No concerning findings today Working on weight loss with diet and exercise Controlling diabetes well Will plan for labs in 3 months      Relevant Medications   Dulaglutide (TRULICITY) 1.5 KP/5.3ZS SOPN   metFORMIN (GLUCOPHAGE) 500 MG tablet   atorvastatin (LIPITOR) 20 MG tablet   Hypertension associated with diabetes (HCC)    BP stable at 128/82 today No red flags today Continue current medications Will plan for labs in 3 months      Relevant Medications   Dulaglutide (TRULICITY) 1.5 MO/7.0BE SOPN   metFORMIN (GLUCOPHAGE) 500 MG tablet   atorvastatin (LIPITOR) 20 MG tablet   Hyperlipidemia associated with type 2 diabetes mellitus (HCC)    Continue statin medication No red flags  today Will monitor labs in 3 months      Relevant Medications   Dulaglutide (TRULICITY) 1.5 ML/5.4GB SOPN   metFORMIN (GLUCOPHAGE) 500 MG tablet   atorvastatin (LIPITOR) 20 MG tablet    Meds ordered this encounter  Medications   Dulaglutide (TRULICITY) 1.5 EE/1.0OF SOPN    Sig: Inject 1.5 mg into the skin once a week.    Dispense:  2 mL    Refill:  6   metFORMIN (GLUCOPHAGE) 500 MG tablet    Sig: Take 1 tablet (500 mg total) by mouth daily with breakfast AND 2 tablets (1,000 mg total) with dinner    Dispense:  270 tablet    Refill:  3   atorvastatin (LIPITOR) 20 MG  tablet    Sig: Take 1 tablet (20 mg total) by mouth at bedtime.    Dispense:  90 tablet    Refill:  3    Follow-up: Return in about 3 months (around 08/18/2021) for diabetes.    Orma Render, NP

## 2021-05-20 NOTE — Assessment & Plan Note (Signed)
No concerning findings today Working on weight loss with diet and exercise Controlling diabetes well Will plan for labs in 3 months

## 2021-05-20 NOTE — Patient Instructions (Signed)
I am SO PROUD OF YOU!!!!   Estoy tan orgullosa de ti!!!!  You have worked very hard and done so well over the past month! I cannot wait to see how well you do in the future.  Ha trabajado muy duro y lo ha hecho muy bien durante el ltimo mes! No puedo esperar a ver qu tan bien lo haces en el futuro.  Keep up the great work!  Mantener el Ernestina [place]!

## 2021-05-20 NOTE — Addendum Note (Signed)
Addended by: Makena Mcgrady, [name] E on: 05/20/2021 06:33 PM   Modules accepted: Level of Service

## 2021-05-20 NOTE — Assessment & Plan Note (Signed)
Continue statin medication No red flags today Will monitor labs in 3 months

## 2021-05-21 ENCOUNTER — Encounter (HOSPITAL_BASED_OUTPATIENT_CLINIC_OR_DEPARTMENT_OTHER): Payer: Self-pay

## 2021-05-21 ENCOUNTER — Ambulatory Visit (HOSPITAL_BASED_OUTPATIENT_CLINIC_OR_DEPARTMENT_OTHER): Admit: 2021-05-21 | Payer: BLUE CROSS/BLUE SHIELD | Admitting: Orthopaedic Surgery

## 2021-05-21 ENCOUNTER — Ambulatory Visit (HOSPITAL_BASED_OUTPATIENT_CLINIC_OR_DEPARTMENT_OTHER): Payer: BLUE CROSS/BLUE SHIELD | Admitting: Nurse Practitioner

## 2021-05-21 ENCOUNTER — Other Ambulatory Visit (HOSPITAL_COMMUNITY): Payer: Self-pay

## 2021-05-21 SURGERY — SHOULDER ARTHROSCOPY WITH SUBACROMIAL DECOMPRESSION AND DISTAL CLAVICLE EXCISION
Anesthesia: General | Site: Shoulder | Laterality: Right

## 2021-05-22 ENCOUNTER — Encounter: Payer: BLUE CROSS/BLUE SHIELD | Admitting: Rehabilitative and Restorative Service Providers"

## 2021-05-26 ENCOUNTER — Encounter: Payer: BLUE CROSS/BLUE SHIELD | Admitting: Rehabilitative and Restorative Service Providers"

## 2021-05-28 ENCOUNTER — Encounter: Payer: Self-pay | Admitting: Rehabilitative and Restorative Service Providers"

## 2021-05-28 ENCOUNTER — Ambulatory Visit (INDEPENDENT_AMBULATORY_CARE_PROVIDER_SITE_OTHER): Payer: BC Managed Care – PPO | Admitting: Rehabilitative and Restorative Service Providers"

## 2021-05-28 ENCOUNTER — Other Ambulatory Visit: Payer: Self-pay

## 2021-05-28 ENCOUNTER — Encounter: Payer: BLUE CROSS/BLUE SHIELD | Admitting: Orthopaedic Surgery

## 2021-05-28 DIAGNOSIS — M6281 Muscle weakness (generalized): Secondary | ICD-10-CM | POA: Diagnosis not present

## 2021-05-28 DIAGNOSIS — M25512 Pain in left shoulder: Secondary | ICD-10-CM | POA: Diagnosis not present

## 2021-05-28 DIAGNOSIS — R293 Abnormal posture: Secondary | ICD-10-CM | POA: Diagnosis not present

## 2021-05-28 DIAGNOSIS — R600 Localized edema: Secondary | ICD-10-CM

## 2021-05-28 DIAGNOSIS — G8929 Other chronic pain: Secondary | ICD-10-CM

## 2021-05-28 NOTE — Therapy (Addendum)
[facility] Physical Therapy [address] [place], [place], [address] Phone: [phone]   Fax:  [phone]  Physical Therapy Treatment /Discharge   Patient Details  Name: Alexandra Norris MRN: [id number] Date of Birth: Dec 17, 1975 Referring Provider (PT): Dwana Melena PA-C   Encounter Date: 05/28/2021   PT End of Session - 05/28/21 0935     Visit Number 16    Number of Visits 22    Date for PT Re-Evaluation 06/04/21    Authorization Type BCBS    Progress Note Due on Visit 20    PT Start Time [id number]    PT Stop Time 1008    PT Time Calculation (min) 30 min    Activity Tolerance Patient tolerated treatment well    Behavior During Therapy [facility] for tasks assessed/performed             Past Medical History:  Diagnosis Date   Anemia    Anemia    Anxiety    Arthritis    Asthma    Bilateral chronic knee pain 09/25/2015   Carpal tunnel syndrome    Depression    DEPRESSION, SITUATIONAL, PROLONGED 02/05/2009   Qualifier: Diagnosis of  By: Sherilyn Cooter  MD, Khary     Dizziness 04/22/2021   Dyspnea    Elevated blood-pressure reading, without diagnosis of hypertension 06/17/2020   Endometriosis    Fatigue 04/22/2021   History of blood transfusion 1999   w/vaginal delivery   History of bronchitis    "I've stayed in hospital 2 X w/bronchitis"   Impingement syndrome of left shoulder    Influenza vaccine needed 04/03/2020   Left carpal tunnel syndrome 07/11/2019   Migraine    Neck pain 10/14/2020   Pneumonia    PONV (postoperative nausea and vomiting)    Pre-diabetes 10/03/2019   A1C 6.4   Right carpal tunnel syndrome 07/11/2019   S/P carpal tunnel release 08/30/2019   Seasonal allergies 10/08/2016   Shingles 11/14/2020    Past Surgical History:  Procedure Laterality Date   ABDOMINAL HYSTERECTOMY  10/17/2019   APPENDECTOMY     CARPAL TUNNEL RELEASE Right 07/19/2019   Procedure: RIGHT CARPAL TUNNEL RELEASE;  Surgeon: Leandrew Koyanagi, MD;  Location: [place];  Service: Orthopedics;  Laterality: Right;   CARPAL TUNNEL RELEASE Left 09/13/2019   Procedure: LEFT CARPAL TUNNEL RELEASE;  Surgeon: Leandrew Koyanagi, MD;  Location: [place];  Service: Orthopedics;  Laterality: Left;  Bier block   [place]; 2008   CHOLECYSTECTOMY  2011   SHOULDER ARTHROSCOPY WITH DISTAL CLAVICLE RESECTION  02/12/2021   Procedure: SHOULDER ARTHROSCOPY WITH DISTAL CLAVICLE RESECTION;  Surgeon: Leandrew Koyanagi, MD;  Location: [place];  Service: Orthopedics;;   SHOULDER ARTHROSCOPY WITH ROTATOR CUFF REPAIR AND SUBACROMIAL DECOMPRESSION Left 02/12/2021   Procedure: LEFT SHOULDER ARTHROSCOPY WITH ROTATOR CUFF REPAIR, SUBACROMIAL DECOMPRESSION, EXTENSIVE DEBRIDEMENT;  Surgeon: Leandrew Koyanagi, MD;  Location: [place];  Service: Orthopedics;  Laterality: Left;   TONSILLECTOMY     tonsilletomy     TUBAL LIGATION  04/2007   VAGINAL HYSTERECTOMY Bilateral 10/17/2019   Procedure: HYSTERECTOMY VAGINAL WITH SALPINGECTOMY;  Surgeon: Chancy Milroy, MD;  Location: [place];  Service: Gynecology;  Laterality: Bilateral;    There were no vitals filed for this visit.   Subjective Assessment - 05/28/21 0940     Subjective Pt. denied pain today and in last week.  Pt. stated feeling like she could do  most things during the day.  Has limitation when reaching backwards, still tight.  Pt. rated overall improvement to normal at 80%    Patient is accompained by: --   tablet interpreter   Currently in Pain? No/denies    Pain Score 0-No pain    Pain Location Shoulder    Pain Orientation Left    Pain Type Surgical pain    Pain Onset More than a month ago    Pain Frequency Intermittent    Aggravating Factors  reaching back tightness    Pain Relieving Factors execise and therapy                [facility] PT Assessment - 05/28/21 0001       Assessment   Medical Diagnosis Z98.890 (ICD-10-CM) - S/P arthroscopy of left  shoulder    Referring Provider (PT) Dwana Melena PA-C    Onset Date/Surgical Date 02/12/21    Hand Dominance Right      Observation/Other Assessments   Focus on Therapeutic Outcomes (FOTO)  updated 61%      AROM   Left Shoulder Flexion 150 Degrees    Left Shoulder ABduction 135 Degrees    Left Shoulder Internal Rotation 75 Degrees    Left Shoulder External Rotation 50 Degrees   in supine 45 deg abduction     PROM   Left Shoulder External Rotation 60 Degrees   in supine 45 deg abduction                          [facility] Adult PT Treatment/Exercise - 05/28/21 0001       Exercises   Other Exercises  Review of HEP      Shoulder Exercises: Supine   Other Supine Exercises horizontal abduction blue band 3 x 10      Shoulder Exercises: Stretch   Other Shoulder Stretches doorway ER stretch in 60 deg abduction 30 sec x 5 c cues for home adjustment      Manual Therapy   Manual therapy comments Lt shoulder mobilization c movement c ER mobility                     PT Education - 05/28/21 1011     Education Details HEP review.    Person(s) Educated Patient    Methods Explanation;Demonstration;Verbal cues    Comprehension Returned demonstration;Verbalized understanding              PT Short Term Goals - 05/07/21 0910       PT SHORT TERM GOAL #1   Title Patient will demonstrate independent use of home exercise program to maintain progress from in clinic treatments.    Status Achieved               PT Long Term Goals - 05/28/21 1008       PT LONG TERM GOAL #1   Title Patient will demonstrate/report pain at worst less than or equal to 2/10 to facilitate minimal limitation in daily activity secondary to pain symptoms.    Status Achieved      PT LONG TERM GOAL #2   Title Patient will demonstrate independent use of home exercise program to facilitate ability to maintain/progress functional gains from skilled physical therapy services.     Status Achieved      PT LONG TERM GOAL #3   Title Patient will demonstrate return to work/recreational activity at previous level of function without limitations secondary  due to condition    Status Achieved      PT LONG TERM GOAL #4   Title Pt. will demonstrate FOTO outcome > or = 63% to indicated reduced disability due to condition.    Status Partially Met      PT LONG TERM GOAL #5   Title Patient will demonstrate Lt [place] joint mobility WFL to facilitate usual self care, dressing, reaching overhead at PLOF s limitation due to symptoms.    Status Partially Met      PT LONG TERM GOAL #6   Title Patient will demonstrate Lt UE MMT 4/5 throughout to facilitate usual lifting, carrying in functional activity to PLOF s limitation.    Status Achieved                   Plan - 05/28/21 1006     Clinical Impression Statement Pt. has continued to show improvements overall and reported reduced complaints in daily activity.  See objective data for updated information.  Most limited impairment in ER mobility but has shown some improvement.  Due to overall presentation and knowledge of HEP, Pt. was appropriate for trial of HEP to continue progress towards reaching all goals.  Review of existing HEP performed.    Personal Factors and Comorbidities Comorbidity 3+    Comorbidities Depression, anxiety, history of Carpal tunnel syndrome    Examination-Activity Limitations Sleep;Sit;Bed Mobility;Bathing;Carry;Caring for Others;Dressing;Hygiene/Grooming;Lift;Reach Overhead    Examination-Participation Restrictions Occupation;Meal Prep;Laundry;Interpersonal Relationship;Driving;Community Activity;Cleaning    Stability/Clinical Decision Making Stable/Uncomplicated    Rehab Potential Good    PT Frequency 2x / week    PT Duration 6 weeks    PT Treatment/Interventions ADLs/Self Care Home Management;Cryotherapy;Electrical Stimulation;Iontophoresis 86m/ml Dexamethasone;Moist Heat;Therapeutic  exercise;Therapeutic activities;Functional mobility training;DME Instruction;Ultrasound;Neuromuscular re-education;Patient/family education;Passive range of motion;Spinal Manipulations;Joint Manipulations;Dry needling;Taping;Vasopneumatic Device;Manual techniques    PT Next Visit Plan Trial HEP.    PT Home Exercise Plan [id number]   Consulted and Agree with Plan of Care Patient             Patient will benefit from skilled therapeutic intervention in order to improve the following deficits and impairments:  Hypomobility, Increased edema, Decreased activity tolerance, Decreased strength, Impaired UE functional use, Pain, Difficulty walking, Decreased mobility, Decreased range of motion, Impaired perceived functional ability, Improper body mechanics, Postural dysfunction, Impaired flexibility, Decreased coordination  Visit Diagnosis: Chronic left shoulder pain  Muscle weakness (generalized)  Abnormal posture  Localized edema     Problem List Patient Active Problem List   Diagnosis Date Noted   Type 1 superior labral anterior-to-posterior (SLAP) tear of right shoulder 04/24/2021   Arthritis of right acromioclavicular joint 04/24/2021   Chronic left shoulder pain 04/22/2021   Hypert[place] diabetes [place] 04/22/2021   Hyperlipidemia associated with type 2 diabetes mellitus ([place] 04/22/2021   Nontraumatic tear of left supraspinatus tendon 02/12/2021   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   Encounter to establish care 09/09/2020   Fatty liver disease, nonalcoholic 009/98/3382  Cervical disc disorder at C5-C6 level with radiculopathy 04/03/2020   Arthritis    Nasal obstruction 02/18/2016   DM2 (diabetes mellitus, type 2) ([place] 10/21/2015   Vitamin D deficiency 09/25/2015   GASTROESOPHAGEAL REFLUX DISEASE 08/14/2009   Body mass index (BMI) 45.0-49.9, adult ([place] 05/30/2007   Moderate persistent asthma with acute exacerbation 12/06/2006    MScot Jun PT,  DPT, OCS, ATC 05/28/21  10:12 AM  PHYSICAL THERAPY DISCHARGE SUMMARY  Visits from Start of Care: 16  Current functional level related to  goals / functional outcomes: See note   Remaining deficits: See note   Education / Equipment: HEP   Patient agrees to discharge. Patient goals were partially met. Patient is being discharged due to not returning since the last visit.  Scot Jun, PT, DPT, OCS, ATC 07/03/21  10:29 AM     [facility] Physical Therapy [address] [place], [place], [address] Phone: [phone]   Fax:  [phone]  Name: Adahlia Stembridge MRN: [id number] Date of Birth: 10/07/1975

## 2021-06-18 ENCOUNTER — Encounter (HOSPITAL_BASED_OUTPATIENT_CLINIC_OR_DEPARTMENT_OTHER)
Admission: RE | Admit: 2021-06-18 | Discharge: 2021-06-18 | Disposition: A | Discharge status: Home / Self Care | Payer: BC Managed Care – PPO | Source: Ambulatory Visit | Attending: Orthopaedic Surgery | Admitting: Orthopaedic Surgery

## 2021-06-18 ENCOUNTER — Other Ambulatory Visit: Payer: Self-pay

## 2021-06-18 ENCOUNTER — Encounter: Payer: Self-pay | Admitting: Orthopaedic Surgery

## 2021-06-18 ENCOUNTER — Encounter (HOSPITAL_BASED_OUTPATIENT_CLINIC_OR_DEPARTMENT_OTHER): Payer: Self-pay | Admitting: Orthopaedic Surgery

## 2021-06-18 ENCOUNTER — Ambulatory Visit (INDEPENDENT_AMBULATORY_CARE_PROVIDER_SITE_OTHER): Payer: BC Managed Care – PPO | Admitting: Orthopaedic Surgery

## 2021-06-18 DIAGNOSIS — Z9889 Other specified postprocedural states: Secondary | ICD-10-CM

## 2021-06-18 DIAGNOSIS — S43431A Superior glenoid labrum lesion of right shoulder, initial encounter: Secondary | ICD-10-CM | POA: Diagnosis not present

## 2021-06-18 DIAGNOSIS — Z01812 Encounter for preprocedural laboratory examination: Secondary | ICD-10-CM | POA: Insufficient documentation

## 2021-06-18 DIAGNOSIS — M19011 Primary osteoarthritis, right shoulder: Secondary | ICD-10-CM

## 2021-06-18 DIAGNOSIS — I152 Hypertension secondary to endocrine disorders: Secondary | ICD-10-CM | POA: Insufficient documentation

## 2021-06-18 DIAGNOSIS — E1159 Type 2 diabetes mellitus with other circulatory complications: Secondary | ICD-10-CM | POA: Insufficient documentation

## 2021-06-18 NOTE — Progress Notes (Signed)
Office Visit Note   Patient: Alexandra Norris           Date of Birth: 20-Nov-1975           MRN: [id number] Visit Date: 06/18/2021              Requested by: Orma Render, NP [place],  [place] [id number] PCP: Orma Render, NP   Assessment & Plan: Visit Diagnoses:  1. Type 1 superior labral anterior-to-posterior (SLAP) tear of right shoulder, initial encounter   2. Arthritis of right acromioclavicular joint   3. S/P arthroscopy of left shoulder     Plan: Patient returns today for follow-up of left shoulder rotator cuff repair on 02/12/2021 and follow-up of right shoulder pain with above-mentioned conditions.  Her left shoulder is feeling much better and the range of motion has completely normalized.  She has an injection scheduled with Dr. Ernestina Patches tomorrow.  Her right shoulder continues to bother her constantly with daily activities.  Left shoulder shows full healed surgical scars.  Normal range of motion in all planes.  Strength intact to manual muscle testing. Right shoulder shows pain with O'Brien and Speed cross body adduction.  Mild pain with empty can sign.  No weakness with empty can.  At this point I feel that the left shoulder has fully recovered and she does not need the injection tomorrow so she can cancel this.  I feel that she is also ready to undergo right shoulder surgery as previously scheduled.  Plan will be for extensive debridement, distal clavicle excision, biceps tenotomy and repair as indicated.  Questions encouraged and answered.  Interpreter present today.  Follow-Up Instructions: No follow-ups on file.   Orders:  No orders of the defined types were placed in this encounter.  No orders of the defined types were placed in this encounter.     Procedures: No procedures performed   Clinical Data: No additional findings.   Subjective: Chief Complaint  Patient presents with   Left Shoulder - Pain, Follow-up     HPI  Review of Systems   Objective: Vital Signs: LMP 09/20/2019 (Exact Date)   Physical Exam  Ortho Exam  Specialty Comments:  No specialty comments available.  Imaging: No results found.   PMFS History: Patient Active Problem List   Diagnosis Date Noted   Type 1 superior labral anterior-to-posterior (SLAP) tear of right shoulder 04/24/2021   Arthritis of right acromioclavicular joint 04/24/2021   Chronic left shoulder pain 04/22/2021   Hypertension associated with diabetes ([place]) 04/22/2021   Hyperlipidemia associated with type 2 diabetes mellitus ([place]) 04/22/2021   Nontraumatic tear of left supraspinatus tendon 02/12/2021   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   Encounter to establish care 09/09/2020   Fatty liver disease, nonalcoholic 02/40/9735   Cervical disc disorder at C5-C6 level with radiculopathy 04/03/2020   Arthritis    Nasal obstruction 02/18/2016   DM2 (diabetes mellitus, type 2) ([place]) 10/21/2015   Vitamin D deficiency 09/25/2015   GASTROESOPHAGEAL REFLUX DISEASE 08/14/2009   Body mass index (BMI) 45.0-49.9, adult ([place]) 05/30/2007   Moderate persistent asthma with acute exacerbation 12/06/2006   Past Medical History:  Diagnosis Date   Anemia    Anemia    Anxiety    Arthritis    Asthma    Bilateral chronic knee pain 09/25/2015   Carpal tunnel syndrome    Depression    DEPRESSION, SITUATIONAL, PROLONGED 02/05/2009   Qualifier: Diagnosis of  By: Sherilyn Cooter  MD,  Khary     Dizziness 04/22/2021   Dyspnea    Elevated blood-pressure reading, without diagnosis of hypertension 06/17/2020   Endometriosis    Fatigue 04/22/2021   History of blood transfusion 1999   w/vaginal delivery   History of bronchitis    "I've stayed in hospital 2 X w/bronchitis"   Impingement syndrome of left shoulder    Influenza vaccine needed 04/03/2020   Left carpal tunnel syndrome 07/11/2019   Migraine    Neck pain 10/14/2020   Pneumonia    PONV (postoperative nausea and  vomiting)    Pre-diabetes 10/03/2019   A1C 6.4   Right carpal tunnel syndrome 07/11/2019   S/P carpal tunnel release 08/30/2019   Seasonal allergies 10/08/2016   Shingles 11/14/2020    Family History  Problem Relation Age of Onset   Diabetes Mother    Osteoarthritis Mother    Hypertension Mother    Hyperlipidemia Mother    Arthritis Mother     Past Surgical History:  Procedure Laterality Date   ABDOMINAL HYSTERECTOMY  10/17/2019   APPENDECTOMY     CARPAL TUNNEL RELEASE Right 07/19/2019   Procedure: RIGHT CARPAL TUNNEL RELEASE;  Surgeon: Leandrew Koyanagi, MD;  Location: [place];  Service: Orthopedics;  Laterality: Right;   CARPAL TUNNEL RELEASE Left 09/13/2019   Procedure: LEFT CARPAL TUNNEL RELEASE;  Surgeon: Leandrew Koyanagi, MD;  Location: [place];  Service: Orthopedics;  Laterality: Left;  Bier block   [place]; 2008   CHOLECYSTECTOMY  2011   SHOULDER ARTHROSCOPY WITH DISTAL CLAVICLE RESECTION  02/12/2021   Procedure: SHOULDER ARTHROSCOPY WITH DISTAL CLAVICLE RESECTION;  Surgeon: Leandrew Koyanagi, MD;  Location: [place];  Service: Orthopedics;;   SHOULDER ARTHROSCOPY WITH ROTATOR CUFF REPAIR AND SUBACROMIAL DECOMPRESSION Left 02/12/2021   Procedure: LEFT SHOULDER ARTHROSCOPY WITH ROTATOR CUFF REPAIR, SUBACROMIAL DECOMPRESSION, EXTENSIVE DEBRIDEMENT;  Surgeon: Leandrew Koyanagi, MD;  Location: [place];  Service: Orthopedics;  Laterality: Left;   TONSILLECTOMY     tonsilletomy     TUBAL LIGATION  04/2007   VAGINAL HYSTERECTOMY Bilateral 10/17/2019   Procedure: HYSTERECTOMY VAGINAL WITH SALPINGECTOMY;  Surgeon: Chancy Milroy, MD;  Location: [place];  Service: Gynecology;  Laterality: Bilateral;   Social History   Occupational History   Not on file  Tobacco Use   Smoking status: Never   Smokeless tobacco: Never  Vaping Use   Vaping Use: Never used  Substance and Sexual Activity   Alcohol use: No   Drug use: No    Sexual activity: Yes    Birth control/protection: Surgical

## 2021-06-19 ENCOUNTER — Ambulatory Visit: Payer: Self-pay | Admitting: Physical Medicine and Rehabilitation

## 2021-06-19 LAB — BASIC METABOLIC PANEL
Anion gap: 13 (ref 5–15)
BUN: 8 mg/dL (ref 6–20)
CO2: 23 mmol/L (ref 22–32)
Calcium: 9.5 mg/dL (ref 8.9–10.3)
Chloride: 105 mmol/L (ref 98–111)
Creatinine, Ser: 0.6 mg/dL (ref 0.44–1.00)
GFR, Estimated: >60 mL/min (ref >60)
Glucose, Bld: 162 mg/dL — ABNORMAL HIGH (ref 70–99)
Potassium: 4.1 mmol/L (ref 3.5–5.1)
Sodium: 141 mmol/L (ref 135–145)

## 2021-06-25 ENCOUNTER — Ambulatory Visit (HOSPITAL_BASED_OUTPATIENT_CLINIC_OR_DEPARTMENT_OTHER): Payer: BC Managed Care – PPO | Admitting: Certified Registered"

## 2021-06-25 ENCOUNTER — Encounter: Payer: Self-pay | Admitting: Orthopaedic Surgery

## 2021-06-25 ENCOUNTER — Other Ambulatory Visit (HOSPITAL_COMMUNITY): Payer: Self-pay

## 2021-06-25 ENCOUNTER — Other Ambulatory Visit: Payer: Self-pay

## 2021-06-25 ENCOUNTER — Encounter (HOSPITAL_BASED_OUTPATIENT_CLINIC_OR_DEPARTMENT_OTHER)
Admission: RE | Disposition: A | Discharge status: Home / Self Care | Payer: Self-pay | Source: Home / Self Care | Attending: Orthopaedic Surgery

## 2021-06-25 ENCOUNTER — Ambulatory Visit (HOSPITAL_BASED_OUTPATIENT_CLINIC_OR_DEPARTMENT_OTHER)
Admission: RE | Admit: 2021-06-25 | Discharge: 2021-06-25 | Disposition: A | Discharge status: Home / Self Care | Payer: BC Managed Care – PPO | Attending: Orthopaedic Surgery | Admitting: Orthopaedic Surgery

## 2021-06-25 ENCOUNTER — Encounter (HOSPITAL_BASED_OUTPATIENT_CLINIC_OR_DEPARTMENT_OTHER): Payer: Self-pay | Admitting: Orthopaedic Surgery

## 2021-06-25 DIAGNOSIS — I10 Essential (primary) hypertension: Secondary | ICD-10-CM | POA: Diagnosis not present

## 2021-06-25 DIAGNOSIS — M19011 Primary osteoarthritis, right shoulder: Secondary | ICD-10-CM | POA: Diagnosis not present

## 2021-06-25 DIAGNOSIS — E119 Type 2 diabetes mellitus without complications: Secondary | ICD-10-CM | POA: Diagnosis not present

## 2021-06-25 DIAGNOSIS — X58XXXA Exposure to other specified factors, initial encounter: Secondary | ICD-10-CM | POA: Diagnosis not present

## 2021-06-25 DIAGNOSIS — J45909 Unspecified asthma, uncomplicated: Secondary | ICD-10-CM | POA: Insufficient documentation

## 2021-06-25 DIAGNOSIS — M7541 Impingement syndrome of right shoulder: Secondary | ICD-10-CM

## 2021-06-25 DIAGNOSIS — S43431A Superior glenoid labrum lesion of right shoulder, initial encounter: Secondary | ICD-10-CM | POA: Diagnosis not present

## 2021-06-25 DIAGNOSIS — F32A Depression, unspecified: Secondary | ICD-10-CM | POA: Diagnosis not present

## 2021-06-25 DIAGNOSIS — I152 Hypertension secondary to endocrine disorders: Secondary | ICD-10-CM

## 2021-06-25 DIAGNOSIS — F419 Anxiety disorder, unspecified: Secondary | ICD-10-CM | POA: Diagnosis not present

## 2021-06-25 DIAGNOSIS — K219 Gastro-esophageal reflux disease without esophagitis: Secondary | ICD-10-CM | POA: Insufficient documentation

## 2021-06-25 LAB — GLUCOSE, CAPILLARY: Glucose-Capillary: 107 mg/dL — ABNORMAL HIGH (ref 70–99)

## 2021-06-25 SURGERY — SHOULDER ARTHROSCOPY WITH SUBACROMIAL DECOMPRESSION AND DISTAL CLAVICLE EXCISION
Anesthesia: Regional | Site: Shoulder | Laterality: Right

## 2021-06-25 MED ORDER — ONDANSETRON HCL 4 MG/2ML IJ SOLN
INTRAMUSCULAR | Status: AC
  Filled 2021-06-25: qty 2

## 2021-06-25 MED ORDER — SODIUM CHLORIDE 0.9 % IR SOLN
Status: DC | PRN
  Administered 2021-06-25: 4500 mL

## 2021-06-25 MED ORDER — FENTANYL CITRATE (PF) 100 MCG/2ML IJ SOLN
INTRAMUSCULAR | Status: AC
  Filled 2021-06-25: qty 2

## 2021-06-25 MED ORDER — CEFAZOLIN SODIUM-DEXTROSE 2-4 GM/100ML-% IV SOLN
INTRAVENOUS | Status: AC
  Filled 2021-06-25: qty 100

## 2021-06-25 MED ORDER — SCOPOLAMINE 1 MG/3DAYS TD PT72
1.0000 | MEDICATED_PATCH | TRANSDERMAL | Status: DC
  Administered 2021-06-25: 1.5 mg via TRANSDERMAL

## 2021-06-25 MED ORDER — AMISULPRIDE (ANTIEMETIC) 5 MG/2ML IV SOLN
10.0000 mg | Freq: Once | INTRAVENOUS | Status: AC
  Administered 2021-06-25: 10 mg via INTRAVENOUS

## 2021-06-25 MED ORDER — LIDOCAINE HCL (CARDIAC) PF 100 MG/5ML IV SOSY
PREFILLED_SYRINGE | INTRAVENOUS | Status: DC | PRN
  Administered 2021-06-25: 60 mg via INTRAVENOUS

## 2021-06-25 MED ORDER — LACTATED RINGERS IV SOLN: INTRAVENOUS | Status: DC

## 2021-06-25 MED ORDER — SCOPOLAMINE 1 MG/3DAYS TD PT72
MEDICATED_PATCH | TRANSDERMAL | Status: AC
  Filled 2021-06-25: qty 1

## 2021-06-25 MED ORDER — ROCURONIUM BROMIDE 100 MG/10ML IV SOLN
INTRAVENOUS | Status: DC | PRN
  Administered 2021-06-25: 60 mg via INTRAVENOUS

## 2021-06-25 MED ORDER — FENTANYL CITRATE (PF) 100 MCG/2ML IJ SOLN
100.0000 ug | Freq: Once | INTRAMUSCULAR | Status: AC
  Administered 2021-06-25: 50 ug via INTRAVENOUS

## 2021-06-25 MED ORDER — BUPIVACAINE HCL (PF) 0.5 % IJ SOLN
INTRAMUSCULAR | Status: DC | PRN
Start: 2021-06-25 — End: 2021-06-25
  Administered 2021-06-25: 15 mL via PERINEURAL

## 2021-06-25 MED ORDER — AMISULPRIDE (ANTIEMETIC) 5 MG/2ML IV SOLN
INTRAVENOUS | Status: AC
  Filled 2021-06-25: qty 4

## 2021-06-25 MED ORDER — DEXAMETHASONE SODIUM PHOSPHATE 10 MG/ML IJ SOLN
INTRAMUSCULAR | Status: AC
  Filled 2021-06-25: qty 1

## 2021-06-25 MED ORDER — ROCURONIUM BROMIDE 10 MG/ML (PF) SYRINGE
PREFILLED_SYRINGE | INTRAVENOUS | Status: AC
  Filled 2021-06-25: qty 10

## 2021-06-25 MED ORDER — LIDOCAINE 2% (20 MG/ML) 5 ML SYRINGE
INTRAMUSCULAR | Status: AC
  Filled 2021-06-25: qty 5

## 2021-06-25 MED ORDER — MIDAZOLAM HCL 2 MG/2ML IJ SOLN
INTRAMUSCULAR | Status: AC
  Filled 2021-06-25: qty 2

## 2021-06-25 MED ORDER — FENTANYL CITRATE (PF) 100 MCG/2ML IJ SOLN: 25.0000 ug | INTRAMUSCULAR | Status: DC | PRN

## 2021-06-25 MED ORDER — OXYCODONE-ACETAMINOPHEN 5-325 MG PO TABS
1.0000 | ORAL_TABLET | Freq: Three times a day (TID) | ORAL | 0 refills | Status: DC | PRN
Start: 2021-06-25 — End: 2021-07-08
  Filled 2021-06-25: qty 30, 5d supply, fill #0

## 2021-06-25 MED ORDER — BUPIVACAINE LIPOSOME 1.3 % IJ SUSP
INTRAMUSCULAR | Status: DC | PRN
Start: 2021-06-25 — End: 2021-06-25
  Administered 2021-06-25: 10 mL via PERINEURAL

## 2021-06-25 MED ORDER — FENTANYL CITRATE (PF) 100 MCG/2ML IJ SOLN
INTRAMUSCULAR | Status: DC | PRN
Start: 2021-06-25 — End: 2021-06-25
  Administered 2021-06-25: 100 ug via INTRAVENOUS

## 2021-06-25 MED ORDER — SUGAMMADEX SODIUM 200 MG/2ML IV SOLN
INTRAVENOUS | Status: DC | PRN
  Administered 2021-06-25: 200 mg via INTRAVENOUS

## 2021-06-25 MED ORDER — PROMETHAZINE HCL 25 MG/ML IJ SOLN: 6.2500 mg | INTRAMUSCULAR | Status: DC | PRN

## 2021-06-25 MED ORDER — PROPOFOL 10 MG/ML IV BOLUS
INTRAVENOUS | Status: DC | PRN
  Administered 2021-06-25: 170 mg via INTRAVENOUS

## 2021-06-25 MED ORDER — PROPOFOL 10 MG/ML IV BOLUS
INTRAVENOUS | Status: AC
  Filled 2021-06-25: qty 20

## 2021-06-25 MED ORDER — DEXAMETHASONE SODIUM PHOSPHATE 4 MG/ML IJ SOLN
INTRAMUSCULAR | Status: DC | PRN
  Administered 2021-06-25: 4 mg via INTRAVENOUS

## 2021-06-25 MED ORDER — ACETAMINOPHEN 10 MG/ML IV SOLN: 1000.0000 mg | Freq: Once | INTRAVENOUS | Status: DC | PRN

## 2021-06-25 MED ORDER — MIDAZOLAM HCL 2 MG/2ML IJ SOLN
2.0000 mg | Freq: Once | INTRAMUSCULAR | Status: AC
  Administered 2021-06-25: 2 mg via INTRAVENOUS

## 2021-06-25 MED ORDER — CEFAZOLIN SODIUM-DEXTROSE 2-4 GM/100ML-% IV SOLN
2.0000 g | INTRAVENOUS | Status: AC
  Administered 2021-06-25: 2 g via INTRAVENOUS

## 2021-06-25 MED ORDER — ONDANSETRON HCL 4 MG/2ML IJ SOLN
INTRAMUSCULAR | Status: DC | PRN
Start: 2021-06-25 — End: 2021-06-25
  Administered 2021-06-25: 4 mg via INTRAVENOUS

## 2021-06-25 SURGICAL SUPPLY — 47 items
BURR OVAL 8 FLU 4.0X13 (MISCELLANEOUS) ×2 IMPLANT
CANNULA 5.75X71 LONG (CANNULA) ×2 IMPLANT
CANNULA SHOULDER 7CM (CANNULA) ×2 IMPLANT
COOLER ICEMAN CLASSIC (MISCELLANEOUS) ×2 IMPLANT
DISSECTOR  3.8MM X 13CM (MISCELLANEOUS) ×2
DISSECTOR 3.8MM X 13CM (MISCELLANEOUS) ×1 IMPLANT
DRAPE IMP U-DRAPE 54X76 (DRAPES) ×2 IMPLANT
DRAPE INCISE IOBAN 66X45 STRL (DRAPES) ×2 IMPLANT
DRAPE STERI 35X30 U-POUCH (DRAPES) ×2 IMPLANT
DRAPE U-SHAPE 47X51 STRL (DRAPES) ×2 IMPLANT
DRAPE U-SHAPE 76X120 STRL (DRAPES) ×4 IMPLANT
DRSG PAD ABDOMINAL 8X10 ST (GAUZE/BANDAGES/DRESSINGS) ×2 IMPLANT
DURAPREP 26ML APPLICATOR (WOUND CARE) ×2 IMPLANT
ELECT REM PT RETURN 9FT ADLT (ELECTROSURGICAL) ×2
ELECTRODE REM PT RTRN 9FT ADLT (ELECTROSURGICAL) ×1 IMPLANT
GAUZE SPONGE 4X4 12PLY STRL (GAUZE/BANDAGES/DRESSINGS) ×2 IMPLANT
GAUZE XEROFORM 1X8 LF (GAUZE/BANDAGES/DRESSINGS) ×2 IMPLANT
GLOVE SURG NEOP MICRO LF SZ7.5 (GLOVE) ×2 IMPLANT
GLOVE SURG POLYISO LF SZ7.5 (GLOVE) ×4 IMPLANT
GLOVE SURG SYN 7.5  E (GLOVE) ×2
GLOVE SURG SYN 7.5 E (GLOVE) ×1 IMPLANT
GLOVE SURG SYN 7.5 PF PI (GLOVE) ×1 IMPLANT
GLOVE SURG UNDER POLY LF SZ7 (GLOVE) ×2 IMPLANT
GLOVE SURG UNDER POLY LF SZ7.5 (GLOVE) ×2 IMPLANT
GOWN STRL REIN XL XLG (GOWN DISPOSABLE) ×2 IMPLANT
GOWN STRL REUS W/ TWL LRG LVL3 (GOWN DISPOSABLE) ×1 IMPLANT
GOWN STRL REUS W/ TWL XL LVL3 (GOWN DISPOSABLE) ×1 IMPLANT
GOWN STRL REUS W/TWL LRG LVL3 (GOWN DISPOSABLE) ×2
GOWN STRL REUS W/TWL XL LVL3 (GOWN DISPOSABLE) ×2
MANIFOLD NEPTUNE II (INSTRUMENTS) ×2 IMPLANT
PACK ARTHROSCOPY DSU (CUSTOM PROCEDURE TRAY) ×2 IMPLANT
PACK BASIN DAY SURGERY FS (CUSTOM PROCEDURE TRAY) ×2 IMPLANT
PAD COLD SHLDR WRAP-ON (PAD) ×2 IMPLANT
PORT APPOLLO RF 90DEGREE MULTI (SURGICAL WAND) ×2 IMPLANT
SHEET MEDIUM DRAPE 40X70 STRL (DRAPES) ×2 IMPLANT
SLEEVE SCD COMPRESS KNEE MED (STOCKING) ×2 IMPLANT
SLING ARM FOAM STRAP LRG (SOFTGOODS) ×1 IMPLANT
SUT ETHILON 3 0 PS 1 (SUTURE) ×2 IMPLANT
SUT MNCRL AB 4-0 PS2 18 (SUTURE) ×2 IMPLANT
SUT VIC AB 0 CT1 27 (SUTURE) ×2
SUT VIC AB 0 CT1 27XCR 8 STRN (SUTURE) ×1 IMPLANT
SUT VIC AB 2-0 CT1 27 (SUTURE) ×2
SUT VIC AB 2-0 CT1 TAPERPNT 27 (SUTURE) ×1 IMPLANT
TOWEL GREEN STERILE FF (TOWEL DISPOSABLE) ×4 IMPLANT
TUBE CONNECTING 20X1/4 (TUBING) IMPLANT
TUBING ARTHROSCOPY IRRIG 16FT (MISCELLANEOUS) ×1 IMPLANT
WATER STERILE IRR 1000ML POUR (IV SOLUTION) ×2 IMPLANT

## 2021-06-25 NOTE — Op Note (Signed)
° °  Date of Surgery: 06/25/2021  INDICATIONS: The patient is a 46 year old female with right shoulder pain that has failed conservative treatment;  The patient did consent to the procedure after discussion of the risks and benefits.  PREOPERATIVE DIAGNOSIS:  1.  Right SLAP tear type I 2.  Right shoulder impingement syndrome 3.  Right shoulder acromioclavicular arthritis  POSTOPERATIVE DIAGNOSIS: Same.  PROCEDURE:  1.  Right shoulder arthroscopy with extensive debridement including biceps tenotomy, glenoid surface, rotator interval, labrum, subdeltoid bursa, subacromial bursa, rotator cuff 2.  Right shoulder arthroscopy with distal clavicle excision 3.  Right shoulder arthroscopic acromioplasty and subacromial decompression.  SURGEON: N. Eduard Roux, M.D.  ASSIST: Madalyn Rob, PA-C  ANESTHESIA:  general, regional  IV FLUIDS AND URINE: See anesthesia.  ESTIMATED BLOOD LOSS: minimal mL.  COMPLICATIONS: None.  DESCRIPTION OF PROCEDURE: The patient was brought to the operating room and placed supine on the operating table.  The patient had been signed prior to the procedure and this was documented. The patient had the anesthesia placed by the anesthesiologist.  A time-out was performed to confirm that this was the correct patient, site, side and location. The patient did receive antibiotics prior to the incision and was re-dosed during the procedure as needed at indicated intervals.  The patient was then positioned into the beach chair position with all bony prominences well padded and neutral C spine. The patient had the operative extremity prepped and draped in the standard surgical fashion.    A posterior shoulder portal was created followed by anterior shoulder portal.  Diagnostic shoulder arthroscopy was first performed.  Glenohumeral surface was unremarkable.  Rotator interval was gently debrided.  There was some degenerative fraying and tearing of the anterior labrum.  Type I  SLAP tear was identified.  The biceps tendon itself was fine.  The articular surface of the rotator cuff was unremarkable.  Gentle debridement was performed for the labrum.  Biceps tenotomy was then performed and the stump of the superior labrum was debrided back to stable margins.  Arthroscope was then placed into the subacromial space.  Subdeltoid and subacromial bursa were removed.  Acromioplasty and subacromial decompression was performed with a high-speed bur.  The bursal surface of the rotator cuff exhibited some mild tendinopathy.  Gentle debridement was performed there.  There was excellent vascularity.  No evidence of full-thickness tears.  Distal clavicle was then identified showed arthrosis and distal clavicle excision was performed taking approximately 4 mm.  Excess fluid was expressed from the shoulder.  Incisions were closed with interrupted nylon sutures.  Sterile dressings were applied.  Patient tolerated procedure well had no me complications.  Tawanna Cooler, my PA, was a medical necessity for the entirety of the surgery including opening, closing, limb positioning, retracting, exposing, and repairing.  POSTOPERATIVE PLAN: Patient will be discharged home.  Sling for comfort until she regains control of her arm.  Range of motion and weight-bear as tolerated.  Follow-up in 1 week for suture removal.  N. Eduard Roux, MD [facility] 1:25 PM

## 2021-06-25 NOTE — Anesthesia Procedure Notes (Signed)
Procedure Name: Intubation Date/Time: 06/25/2021 12:27 PM Performed by: Darral Dash, DO Pre-anesthesia Checklist: Patient identified, Emergency Drugs available, Suction available and Patient being monitored Patient Re-evaluated:Patient Re-evaluated prior to induction Oxygen Delivery Method: Circle System Utilized Preoxygenation: Pre-oxygenation with 100% oxygen Induction Type: IV induction Ventilation: Mask ventilation without difficulty, Two handed mask ventilation required, Mask ventilation throughout procedure and Oral airway inserted - appropriate to patient size Laryngoscope Size: Glidescope and 4 Grade View: Grade I Tube type: Oral Tube size: 7.5 mm Number of attempts: 3 Airway Equipment and Method: Oral airway and Rigid stylet Placement Confirmation: ETT inserted through vocal cords under direct vision, positive ETCO2 and breath sounds checked- equal and bilateral Secured at: 23 cm Tube secured with: Tape Dental Injury: Teeth and Oropharynx as per pre-operative assessment  Difficulty Due To: Difficult Airway- due to limited oral opening Comments: Initial attempt DL by CRNA successful. Easy 2 hand BMV with OPA. Two attempts via glidescope #4 once by CRNA once by self. Grade I view, atraumatic, no damage to dentition. Diffficulty 2/2 limited mouth opening and extensive orophayrngeal redundant tissue.

## 2021-06-25 NOTE — H&P (Signed)
PREOPERATIVE H&P  Chief Complaint: right shoulder slap tear, acromioclavicular arthropathy  HPI: Alexandra Norris is a 46 y.o. female who presents for surgical treatment of right shoulder slap tear, acromioclavicular arthropathy.  She denies any changes in medical history.  Past Medical History:  Diagnosis Date   Anemia    Anemia    Anxiety    Arthritis    Asthma    Bilateral chronic knee pain 09/25/2015   Carpal tunnel syndrome    Depression    DEPRESSION, SITUATIONAL, PROLONGED 02/05/2009   Qualifier: Diagnosis of  By: Sherilyn Cooter  MD, Khary     Dizziness 04/22/2021   Dyspnea    Elevated blood-pressure reading, without diagnosis of hypertension 06/17/2020   Endometriosis    Fatigue 04/22/2021   History of blood transfusion 1999   w/vaginal delivery   History of bronchitis    "I've stayed in hospital 2 X w/bronchitis"   Impingement syndrome of left shoulder    Influenza vaccine needed 04/03/2020   Left carpal tunnel syndrome 07/11/2019   Migraine    Neck pain 10/14/2020   Pneumonia    PONV (postoperative nausea and vomiting)    Pre-diabetes 10/03/2019   A1C 6.4   Right carpal tunnel syndrome 07/11/2019   S/P carpal tunnel release 08/30/2019   Seasonal allergies 10/08/2016   Shingles 11/14/2020   Past Surgical History:  Procedure Laterality Date   ABDOMINAL HYSTERECTOMY  10/17/2019   APPENDECTOMY     CARPAL TUNNEL RELEASE Right 07/19/2019   Procedure: RIGHT CARPAL TUNNEL RELEASE;  Surgeon: Leandrew Koyanagi, MD;  Location: [facility];  Service: Orthopedics;  Laterality: Right;   CARPAL TUNNEL RELEASE Left 09/13/2019   Procedure: LEFT CARPAL TUNNEL RELEASE;  Surgeon: Leandrew Koyanagi, MD;  Location: [facility];  Service: Orthopedics;  Laterality: Left;  Bier block   [place]; 2008   CHOLECYSTECTOMY  2011   SHOULDER ARTHROSCOPY WITH DISTAL CLAVICLE RESECTION  02/12/2021   Procedure: SHOULDER ARTHROSCOPY WITH DISTAL CLAVICLE RESECTION;   Surgeon: Leandrew Koyanagi, MD;  Location: [facility];  Service: Orthopedics;;   SHOULDER ARTHROSCOPY WITH ROTATOR CUFF REPAIR AND SUBACROMIAL DECOMPRESSION Left 02/12/2021   Procedure: LEFT SHOULDER ARTHROSCOPY WITH ROTATOR CUFF REPAIR, SUBACROMIAL DECOMPRESSION, EXTENSIVE DEBRIDEMENT;  Surgeon: Leandrew Koyanagi, MD;  Location: [facility];  Service: Orthopedics;  Laterality: Left;   TONSILLECTOMY     tonsilletomy     TUBAL LIGATION  04/2007   VAGINAL HYSTERECTOMY Bilateral 10/17/2019   Procedure: HYSTERECTOMY VAGINAL WITH SALPINGECTOMY;  Surgeon: Chancy Milroy, MD;  Location: [facility];  Service: Gynecology;  Laterality: Bilateral;   Social History   Socioeconomic History   Marital status: Married    Spouse name: Not on file   Number of children: 3   Years of education: Not on file   Highest education level: 12th grade  Occupational History   Not on file  Tobacco Use   Smoking status: Never   Smokeless tobacco: Never  Vaping Use   Vaping Use: Never used  Substance and Sexual Activity   Alcohol use: No   Drug use: No   Sexual activity: Yes    Birth control/protection: Surgical  Other Topics Concern   Not on file  Social History Narrative   Not on file   Social Determinants of Health   Financial Resource Strain: Not on file  Food Insecurity: Not on file  Transportation Needs: Not on file  Physical Activity:  Not on file  Stress: Not on file  Social Connections: Not on file   Family History  Problem Relation Age of Onset   Diabetes Mother    Osteoarthritis Mother    Hypertension Mother    Hyperlipidemia Mother    Arthritis Mother    No Known Allergies Prior to Admission medications   Medication Sig Start Date End Date Taking? Authorizing Provider  atorvastatin (LIPITOR) 20 MG tablet Take 1 tablet (20 mg total) by mouth at bedtime. 05/20/21  Yes Early, Coralee Pesa, NP  Dulaglutide (TRULICITY) 1.5 JT/7.0VX SOPN Inject 1.5 mg into the skin once a week.  05/20/21  Yes Early, Coralee Pesa, NP  lisinopril (ZESTRIL) 10 MG tablet Take 1 tablet (10 mg total) by mouth daily. 04/22/21  Yes Early, Coralee Pesa, NP  metFORMIN (GLUCOPHAGE) 500 MG tablet Take 1 tablet (500 mg total) by mouth daily with breakfast AND 2 tablets (1,000 mg total) with dinner 05/20/21  Yes Early, Coralee Pesa, NP  Multiple Vitamin (MULTIVITAMIN WITH MINERALS) TABS tablet Take 1 tablet by mouth daily in the afternoon.   Yes [provider]  albuterol (VENTOLIN HFA) 108 (90 Base) MCG/ACT inhaler Inhale 2 puffs into the lungs in the morning and at bedtime. 09/09/20   Orma Render, NP  Blood Glucose Monitoring Suppl (CONTOUR NEXT MONITOR) w/Device KIT USE SEGUN LO INDICADO 3 VECES AL DIA ANTES DE LAS COMIDAS 08/05/20 08/05/21  Charlott Rakes, MD  COVID-19 mRNA bivalent vaccine, Pfizer, (PFIZER COVID-19 VAC BIVALENT) injection Inject into the muscle. 04/22/21   Carlyle Basques, MD  gabapentin (NEURONTIN) 100 MG capsule Take 1 tab (110m) up to every 8 hours during the day as needed for pain.  Take 3 tab (3082m at bedtime as needed for pain. 11/14/20   Early, SaCoralee PesaNP  glucose blood test strip USE SEGUN LO INDICADO 3 VECES AL DIA ANTES DE LAS COMIDAS Patient taking differently: USE SEGUN LO INDICADO 3 VECES AL DIA ANTES DE LAS COMIDAS 08/05/20 08/05/21  NeCharlott RakesMD  meloxicam (MOBIC) 7.5 MG tablet Take 1-2 tablets (7.5-15 mg total) by mouth daily as needed for shoudler pain. Max of 2 daily. Tome 1-2 tabletas cada dia para el dolor del hombro. No mas de 2 tabletas cada 24 horas 04/22/21   Early, SaCoralee PesaNP  Microlet Lancets MISC USE TO CHECK BLOOD SUGAR 3 VECES AL DIA 08/06/20 08/06/21  NeCharlott RakesMD  ondansetron (ZOFRAN) 4 MG tablet Take 1 tablet (4 mg total) by mouth every 8 (eight) hours as needed for nausea or vomiting. 02/19/21   StAundra DubinPA-C  TRUEplus Lancets 28G MISC USE SEGUN LO INDICADO 3 VECES AL DIA ANTES DE LAS COMIDAS 08/05/20 08/05/21  NeCharlott RakesMD     Positive  ROS: All other systems have been reviewed and were otherwise negative with the exception of those mentioned in the HPI and as above.  Physical Exam: General: Alert, no acute distress Cardiovascular: No pedal edema Respiratory: No cyanosis, no use of accessory musculature GI: abdomen soft Skin: No lesions in the area of chief complaint Neurologic: Sensation intact distally Psychiatric: Patient is competent for consent with normal mood and affect Lymphatic: no lymphedema  MUSCULOSKELETAL: exam stable  Assessment: right shoulder slap tear, acromioclavicular arthropathy  Plan: Plan for Procedure(s): RIGHT SHOULDER ARTHROSCOPY, DISTAL CLAVICLE EXCISION, EXTENSIVE DEBRIDEMENT, SUBACROMIAL DECOMPRESSION  The risks benefits and alternatives were discussed with the patient including but not limited to the risks of nonoperative treatment, versus surgical intervention including  infection, bleeding, nerve injury,  blood clots, cardiopulmonary complications, morbidity, mortality, among others, and they were willing to proceed.   Preoperative templating of the joint replacement has been completed, documented, and submitted to the Operating Room personnel in order to optimize intra-operative equipment management.   Eduard Roux, MD 06/25/2021 10:35 AM

## 2021-06-25 NOTE — Discharge Instructions (Addendum)
  Post-operative patient instructions  Shoulder Arthroscopy   Ice:  Place intermittent ice or cooler pack over your shoulder, 30 minutes on and 30 minutes off.  Continue this for the first 72 hours after surgery, then save ice for use after therapy sessions or on more active days.   Weight:  You may bear weight on your arm as your symptoms allow. Motion:  Perform gentle shoulder motion as tolerated Dressing:  Perform 1st dressing change at 2 days postoperative. A moderate amount of blood tinged drainage is to be expected.  So if you bleed through the dressing on the first or second day or if you have fevers, it is fine to change the dressing/check the wounds early and redress wound.  If it bleeds through again, or if the incisions are leaking frank blood, please call the office. May change dressing every 1-2 days thereafter to help watch wounds. Can purchase Tegaderm (or 3M Nexcare) water resistant dressings at local pharmacy / [facility]. Shower:  Light shower is ok after 2 days.  Please take shower, NO bath. Recover with gauze and ace wrap to help keep wounds protected.   Pain medication:  A narcotic pain medication has been prescribed.  Take as directed.  Typically you need narcotic pain medication more regularly during the first 3 to 5 days after surgery.  Decrease your use of the medication as the pain improves.  Narcotics can sometimes cause constipation, even after a few doses.  If you have problems with constipation, you can take an over the counter stool softener or light laxative.  If you have persistent problems, please notify your physician's office. Physical therapy: Additional activity guidelines to be provided by your physician or physical therapist at follow-up visits.  Driving: Do not recommend driving x 2 weeks post surgical, especially if surgery performed on right side. Should not drive while taking narcotic pain medications. It typically takes at least 2 weeks to restore sufficient  neuromuscular function for normal reaction times for driving safety.  Call [phone] for questions or problems. Evenings you will be forwarded to the hospital operator.  Ask for the orthopaedic physician on call. Please call if you experience:    Redness, foul smelling, or persistent drainage from the surgical site  worsening shoulder pain and swelling not responsive to medication  any calf pain and or swelling of the lower leg  temperatures greater than 101.5 F other questions or concerns   Thank you for allowing us to be a part of your care.     Post Anesthesia Home Care Instructions  Activity: Get plenty of rest for the remainder of the day. A responsible individual must stay with you for 24 hours following the procedure.  For the next 24 hours, DO NOT: -Drive a car -Operate machinery -Drink alcoholic beverages -Take any medication unless instructed by your physician -Make any legal decisions or sign important papers.  Meals: Start with liquid foods such as gelatin or soup. Progress to regular foods as tolerated. Avoid greasy, spicy, heavy foods. If nausea and/or vomiting occur, drink only clear liquids until the nausea and/or vomiting subsides. Call your physician if vomiting continues.  Special Instructions/Symptoms: Your throat may feel dry or sore from the anesthesia or the breathing tube placed in your throat during surgery. If this causes discomfort, gargle with warm salt water. The discomfort should disappear within 24 hours.  If you had a scopolamine patch placed behind your ear for the management of post- operative nausea and/or vomiting:    1. The medication in the patch is effective for 72 hours, after which it should be removed.  Wrap patch in a tissue and discard in the trash. Wash hands thoroughly with soap and water. 2. You may remove the patch earlier than 72 hours if you experience unpleasant side effects which may include dry mouth, dizziness or visual  disturbances. 3. Avoid touching the patch. Wash your hands with soap and water after contact with the patch.    Regional Anesthesia Blocks  1. Numbness or the inability to move the "blocked" extremity may last from 3-48 hours after placement. The length of time depends on the medication injected and your individual response to the medication. If the numbness is not going away after 48 hours, call your surgeon.  2. The extremity that is blocked will need to be protected until the numbness is gone and the  Strength has returned. Because you cannot feel it, you will need to take extra care to avoid injury. Because it may be weak, you may have difficulty moving it or using it. You may not know what position it is in without looking at it while the block is in effect.  3. For blocks in the legs and feet, returning to weight bearing and walking needs to be done carefully. You will need to wait until the numbness is entirely gone and the strength has returned. You should be able to move your leg and foot normally before you try and bear weight or walk. You will need someone to be with you when you first try to ensure you do not fall and possibly risk injury.  4. Bruising and tenderness at the needle site are common side effects and will resolve in a few days.  5. Persistent numbness or new problems with movement should be communicated to the surgeon or the [facility] ([phone])/ [facility] ([phone]). 

## 2021-06-25 NOTE — Anesthesia Preprocedure Evaluation (Addendum)
Anesthesia Evaluation  Patient identified by MRN, date of birth, ID band Patient awake    Reviewed: Allergy & Precautions, NPO status , Patient's Chart, lab work & pertinent test results  History of Anesthesia Complications (+) PONV  Airway Mallampati: III  TM Distance: >3 FB Neck ROM: Full    Dental no notable dental hx.    Pulmonary asthma ,    Pulmonary exam normal        Cardiovascular hypertension, Pt. on medications  Rhythm:Regular Rate:Normal     Neuro/Psych  Headaches, Anxiety Depression    GI/Hepatic Neg liver ROS, GERD  ,  Endo/Other  diabetes, Type 2, Oral Hypoglycemic Agents  Renal/GU negative Renal ROS  negative genitourinary   Musculoskeletal  (+) Arthritis , Osteoarthritis,    Abdominal Normal abdominal exam  (+)   Bowel sounds: normal.  Peds  Hematology  (+) anemia ,   Anesthesia Other Findings   Reproductive/Obstetrics                            Anesthesia Physical Anesthesia Plan  ASA: 3  Anesthesia Plan: General and Regional   Post-op Pain Management: Regional block   Induction: Intravenous  PONV Risk Score and Plan: 4 or greater and Ondansetron, Dexamethasone, Midazolam, Scopolamine patch - Pre-op and Treatment may vary due to age or medical condition  Airway Management Planned: Mask and Oral ETT  Additional Equipment: None  Intra-op Plan:   Post-operative Plan: Extubation in OR  Informed Consent: I have reviewed the patients History and Physical, chart, labs and discussed the procedure including the risks, benefits and alternatives for the proposed anesthesia with the patient or authorized representative who has indicated his/her understanding and acceptance.     Dental advisory given  Plan Discussed with: CRNA  Anesthesia Plan Comments: (Lab Results      Component                Value               Date                      WBC                       10.0                04/22/2021                HGB                      12.6                04/22/2021                HCT                      42.1                04/22/2021                MCV                      80                  04/22/2021                PLT  263                 04/22/2021           Lab Results      Component                Value               Date                      NA                       141                 06/18/2021                K                        4.1                 06/18/2021                CO2                      23                  06/18/2021                GLUCOSE                  162 (H)             06/18/2021                BUN                      8                   06/18/2021                CREATININE               0.60                06/18/2021                CALCIUM                  9.5                 06/18/2021                EGFR                     89                  04/22/2021                GFRNONAA                 >60                 06/18/2021          )       Anesthesia Quick Evaluation

## 2021-06-25 NOTE — Progress Notes (Signed)
Assisted Dr. Jana Half with right, ultrasound guided, interscalene  block. Side rails up, monitors on throughout procedure. See vital signs in flow sheet. Tolerated Procedure well.

## 2021-06-25 NOTE — Transfer of Care (Signed)
Immediate Anesthesia Transfer of Care Note  Patient: Asante Ritacco  Procedure(s) Performed: RIGHT SHOULDER ARTHROSCOPY, DISTAL CLAVICLE EXCISION, EXTENSIVE DEBRIDEMENT, SUBACROMIAL DECOMPRESSION (Right: Shoulder)  Patient Location: PACU  Anesthesia Type:GA combined with regional for post-op pain  Level of Consciousness: sedated  Airway & Oxygen Therapy: Patient Spontanous Breathing and Patient connected to face mask oxygen  Post-op Assessment: Report given to RN and Post -op Vital signs reviewed and stable  Post vital signs: Reviewed and stable  Last Vitals:  Vitals Value Taken Time  BP 146/91 06/25/21 1345  Temp    Pulse 103 06/25/21 1348  Resp 22 06/25/21 1348  SpO2 100 % 06/25/21 1348  Vitals shown include unvalidated device data.  Last Pain:  Vitals:   06/25/21 1043  TempSrc: Oral  PainSc: 8       Patients Stated Pain Goal: 5 (82/41/75 3010)  Complications: No notable events documented.

## 2021-06-25 NOTE — Anesthesia Postprocedure Evaluation (Signed)
Anesthesia Post Note  Patient: Aela Bohan  Procedure(s) Performed: RIGHT SHOULDER ARTHROSCOPY, DISTAL CLAVICLE EXCISION, EXTENSIVE DEBRIDEMENT, SUBACROMIAL DECOMPRESSION (Right: Shoulder)     Patient location during evaluation: PACU Anesthesia Type: Regional and General Level of consciousness: awake and alert Pain management: pain level controlled Vital Signs Assessment: post-procedure vital signs reviewed and stable Respiratory status: spontaneous breathing, nonlabored ventilation, respiratory function stable and patient connected to nasal cannula oxygen Cardiovascular status: blood pressure returned to baseline and stable Postop Assessment: no apparent nausea or vomiting Anesthetic complications: no   No notable events documented.  Last Vitals:  Vitals:   06/25/21 1430 06/25/21 1443  BP: 138/76 (!) 156/85  Pulse: 98 98  Resp: 18 18  Temp:  (!) 36.3 C  SpO2: 93% 95%    Last Pain:  Vitals:   06/25/21 1443  TempSrc:   PainSc: 0-No pain                 Belenda Cruise P Jerimyah Vandunk

## 2021-06-25 NOTE — Anesthesia Procedure Notes (Signed)
Anesthesia Regional Block: Interscalene brachial plexus block   Pre-Anesthetic Checklist: , timeout performed,  Correct Patient, Correct Site, Correct Laterality,  Correct Procedure, Correct Position, site marked,  Risks and benefits discussed,  Surgical consent,  Pre-op evaluation,  At surgeon's request and post-op pain management  Laterality: Right  Prep: Dura Prep       Needles:  Injection technique: Single-shot  Needle Type: Echogenic Stimulator Needle     Needle Length: 5cm  Needle Gauge: 20     Additional Needles:   Procedures:,,,, ultrasound used (permanent image in chart),,    Narrative:  Start time: 06/25/2021 11:33 AM End time: 06/25/2021 11:36 AM Injection made incrementally with aspirations every 5 mL.  Performed by: Personally  Anesthesiologist: Darral Dash, DO  Additional Notes: Patient identified. Risks/Benefits/Options discussed with patient including but not limited to bleeding, infection, nerve damage, failed block, incomplete pain control. Patient expressed understanding and wished to proceed. All questions were answered. Sterile technique was used throughout the entire procedure. Please see nursing notes for vital signs. Aspirated in 5cc intervals with injection for negative confirmation. Patient was given instructions on fall risk and not to get out of bed. All questions and concerns addressed with instructions to call with any issues or inadequate analgesia.

## 2021-06-30 ENCOUNTER — Ambulatory Visit: Payer: BLUE CROSS/BLUE SHIELD | Admitting: Dietician

## 2021-07-01 ENCOUNTER — Encounter: Payer: BC Managed Care – PPO | Attending: Nurse Practitioner | Admitting: Dietician

## 2021-07-01 ENCOUNTER — Other Ambulatory Visit: Payer: Self-pay

## 2021-07-01 ENCOUNTER — Other Ambulatory Visit (HOSPITAL_COMMUNITY): Payer: Self-pay

## 2021-07-01 ENCOUNTER — Encounter: Payer: Self-pay | Admitting: Dietician

## 2021-07-01 DIAGNOSIS — K760 Fatty (change of) liver, not elsewhere classified: Secondary | ICD-10-CM | POA: Insufficient documentation

## 2021-07-01 DIAGNOSIS — I152 Hypertension secondary to endocrine disorders: Secondary | ICD-10-CM | POA: Diagnosis not present

## 2021-07-01 DIAGNOSIS — E1165 Type 2 diabetes mellitus with hyperglycemia: Secondary | ICD-10-CM | POA: Insufficient documentation

## 2021-07-01 DIAGNOSIS — Z6841 Body Mass Index (BMI) 40.0 and over, adult: Secondary | ICD-10-CM | POA: Insufficient documentation

## 2021-07-01 DIAGNOSIS — E785 Hyperlipidemia, unspecified: Secondary | ICD-10-CM | POA: Diagnosis not present

## 2021-07-01 DIAGNOSIS — E1159 Type 2 diabetes mellitus with other circulatory complications: Secondary | ICD-10-CM | POA: Insufficient documentation

## 2021-07-01 DIAGNOSIS — E1169 Type 2 diabetes mellitus with other specified complication: Secondary | ICD-10-CM | POA: Diagnosis not present

## 2021-07-01 NOTE — Progress Notes (Signed)
Diabetes Self-Management Education  Visit Type: First/Initial  Appt. Start Time: 0815 Appt. End Time: 0945  07/01/2021  Alexandra Norris, identified by name and date of birth, is a 46 y.o. female with a diagnosis of Diabetes: Type 2.   ASSESSMENT Patient is here today with an interpretor Lily from [facility]. She states that she has been diagnosed with diabetes for the past 6 months.  She would like to learn how to better manage her blood glucose.  She states that she does not want to be in fear about her blood glucose.  Patient has been referred for obesity, Type 2 diabetes, fatty liver (nonalcoholic), HTN, HLD. A1C:  12%, Cholesterol 202, HDL 45, LDL 113, Triglycerides 252, vitamin D 29 04/22/2021  Weight hx: 236 lbs 07/01/2020 250 lbs 01/2021 (highest adult weight) She states that she became depressed because her weight was so high.  She gained weight with her increased health problems and having to be out of work and used food to take away of her anxiety.   She is used to working and being out of the home a lot.  Patient lives alone.  (Mother is currently living with her since her since 02/2021 shoulder surgery and not more recent should surgery 06/25/2020.)  Her mother has type 2 diabetes and has been helping patient learn. She is not working for the past 2 years since having orthopedic surgeries and hysterectomy.  She was in Architect and housekeeping. She has completed through the 7th grade in [place].  She has some food insecurity.  She does have food stamps and has not used the food pantries.  Height 5\' 1"  (1.549 m), weight 236 lb (107 kg), last menstrual period 09/20/2019. Body mass index is 44.59 kg/m.   Diabetes Self-Management Education - 07/01/21 0836       Visit Information   Visit Type First/Initial      Initial Visit   Diabetes Type Type 2    Are you currently following a meal plan? No    Are you taking your medications as prescribed? Yes    Date  Diagnosed 2022      Health Coping   How would you rate your overall health? Fair      Psychosocial Assessment   Patient Belief/Attitude about Diabetes Denial    Self-care barriers English as a second language    Self-management support Doctor's office    Other persons present Patient    Patient Concerns Nutrition/Meal planning;Glycemic Control;Weight Control    Special Needs None    Preferred Learning Style No preference indicated    Learning Readiness Ready    How often do you need to have someone help you when you read instructions, pamphlets, or other written materials from your doctor or pharmacy? 3 - Sometimes    What is the last grade level you completed in school? 7      Pre-Education Assessment   Patient understands the diabetes disease and treatment process. Needs Instruction    Patient understands incorporating nutritional management into lifestyle. Needs Instruction    Patient undertands incorporating physical activity into lifestyle. Needs Instruction    Patient understands using medications safely. Needs Instruction    Patient understands monitoring blood glucose, interpreting and using results Needs Instruction    Patient understands prevention, detection, and treatment of acute complications. Needs Instruction    Patient understands prevention, detection, and treatment of chronic complications. Needs Instruction    Patient understands how to develop strategies to address psychosocial issues.  Needs Instruction    Patient understands how to develop strategies to promote health/change behavior. Needs Instruction      Complications   Last HgB A1C per patient/outside source 12 %   04/22/2021   How often do you check your blood sugar? 1-2 times/day    Fasting Blood glucose range (mg/dL) 70-129    Postprandial Blood glucose range (mg/dL) 130-179    Number of hypoglycemic episodes per month 1    Can you tell when your blood sugar is low? Yes    What do you do if your blood  sugar is low? ate chocolate    Number of hyperglycemic episodes per week 0    Have you had a dilated eye exam in the past 12 months? Yes    Have you had a dental exam in the past 12 months? Yes    Are you checking your feet? No      Dietary Intake   Breakfast skips OR shake (oatmeal, spinach, yogurt, almond milk, occasional fruit)    Snack (morning) none    Lunch soup and chicken, occasional cucumber   11-12   Snack (afternoon) none    Dinner beans, chicken, fruit OR soup, cucumber, fruit    Snack (evening) none    Beverage(s) mineral water,      Exercise   Exercise Type ADL's      Patient Education   Previous Diabetes Education No    Disease state  Definition of diabetes, type 1 and 2, and the diagnosis of diabetes    Nutrition management  Role of diet in the treatment of diabetes and the relationship between the three main macronutrients and blood glucose level;Meal options for control of blood glucose level and chronic complications.    Physical activity and exercise  Role of exercise on diabetes management, blood pressure control and cardiac health.;Helped patient identify appropriate exercises in relation to his/her diabetes, diabetes complications and other health issue.    Medications Reviewed patients medication for diabetes, action, purpose, timing of dose and side effects.    Monitoring Identified appropriate SMBG and/or A1C goals.    Acute complications Taught treatment of hypoglycemia - the 15 rule.;Discussed and identified patients' treatment of hyperglycemia.      Individualized Goals (developed by patient)   Nutrition General guidelines for healthy choices and portions discussed    Physical Activity Exercise 5-7 days per week;30 minutes per day    Medications take my medication as prescribed    Monitoring  test my blood glucose as discussed    Reducing Risk examine blood glucose patterns;increase portions of healthy fats      Post-Education Assessment   Patient  understands the diabetes disease and treatment process. Demonstrates understanding / competency    Patient understands incorporating nutritional management into lifestyle. Needs Review    Patient undertands incorporating physical activity into lifestyle. Demonstrates understanding / competency    Patient understands using medications safely. Demonstrates understanding / competency    Patient understands prevention, detection, and treatment of acute complications. Demonstrates understanding / competency    Patient understands prevention, detection, and treatment of chronic complications. Needs Review    Patient understands how to develop strategies to address psychosocial issues. Needs Review    Patient understands how to develop strategies to promote health/change behavior. Needs Review      Outcomes   Expected Outcomes Demonstrated interest in learning. Expect positive outcomes    Future DMSE 2 months    Program Status Not Completed  Individualized Plan for Diabetes Self-Management Training:   Learning Objective:  Patient will have a greater understanding of diabetes self-management. Patient education plan is to attend individual and/or group sessions per assessed needs and concerns.   Plan:   Patient Instructions  Aim to be active most days.  Goal 30 minutes most days. Continue mindful eating. Avoid stress eating.  Each meal should have 1/2 the plate non-starchy vegetables and a small protein portion such as chicken, fish, yogurt, or frijoles.  Continue to check your blood sugar as prescribed. Continue to take your medication as prescribed.   Expected Outcomes:  Demonstrated interest in learning. Expect positive outcomes  Education material provided: Meal plan card and My Plate, both in Spanish), [facility] diabetes book in [place], food pantry list  If problems or questions, patient to contact team via:  Phone  Future DSME appointment: 2 months

## 2021-07-01 NOTE — Patient Instructions (Addendum)
Aim to be active most days.  Goal 30 minutes most days. Continue mindful eating. Avoid stress eating.  Each meal should have 1/2 the plate non-starchy vegetables and a small protein portion such as chicken, fish, yogurt, or frijoles.  Continue to check your blood sugar as prescribed. Continue to take your medication as prescribed.

## 2021-07-02 ENCOUNTER — Encounter: Payer: Self-pay | Admitting: Orthopaedic Surgery

## 2021-07-02 ENCOUNTER — Ambulatory Visit (INDEPENDENT_AMBULATORY_CARE_PROVIDER_SITE_OTHER): Payer: BC Managed Care – PPO | Admitting: Physician Assistant

## 2021-07-02 DIAGNOSIS — Z9889 Other specified postprocedural states: Secondary | ICD-10-CM

## 2021-07-02 NOTE — Progress Notes (Signed)
Post-Op Visit Note   Patient: Alexandra Norris           Date of Birth: 1975/08/26           MRN: [id number] Visit Date: 07/02/2021 PCP: Orma Render, NP   Assessment & Plan:  Chief Complaint:  Chief Complaint  Patient presents with   Right Shoulder - Routine Post Op   Visit Diagnoses:  1. S/P arthroscopy of right shoulder     Plan: Patient is a pleasant 9 Spanish-speaking female who is here today with her interpreter.  She is 1 week status post right shoulder arthroscopic debridement, decompression and distal clavicle excision 06/25/2021.  She has been doing well.  Very minimal pain.  She has been wearing her sling.  Examination of her right shoulder reveals well-healed surgical portals with nylon sutures in place.  No evidence of infection or cellulitis.  She is neurovascular intact distally.  Today, sutures were removed and Steri-Strips applied.  Internal referral for physical therapy was made.  She will follow-up with [place] in 5 weeks time for recheck.  Call with concerns or questions in meantime.  Follow-Up Instructions: Return in about 5 weeks (around 08/06/2021).   Orders:  Orders Placed This Encounter  Procedures   Ambulatory referral to Physical Therapy   No orders of the defined types were placed in this encounter.   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   Encounter to establish care 09/09/2020   Fatty liver disease, nonalcoholic 27/78/2423   Cervical disc disorder at C5-C6 level with  radiculopathy 04/03/2020   Arthritis    Nasal obstruction 02/18/2016   DM2 (diabetes mellitus, type 2) ([place]) 10/21/2015   Vitamin D deficiency 09/25/2015   GASTROESOPHAGEAL REFLUX DISEASE 08/14/2009   Body mass index (BMI) 45.0-49.9, adult ([place]) 05/30/2007   Moderate persistent asthma with acute exacerbation 12/06/2006   Past Medical History:  Diagnosis Date   Anemia    Anemia    Anxiety    Arthritis    Asthma    Bilateral chronic knee pain 09/25/2015   Carpal tunnel syndrome    Depression    DEPRESSION, SITUATIONAL, PROLONGED 02/05/2009   Qualifier: Diagnosis of  By: Sherilyn Cooter  MD, Khary     Diabetes mellitus without complication ([place])    Dizziness 04/22/2021   Dyspnea    Elevated blood-pressure reading, without diagnosis of hypertension 06/17/2020   Endometriosis    Fatigue 04/22/2021   History of blood transfusion 1999   w/vaginal delivery   History of bronchitis    "I've stayed in hospital 2 X w/bronchitis"   Hyperlipidemia    Hypertension    Impingement syndrome of left shoulder    Influenza vaccine needed 04/03/2020   Left carpal tunnel syndrome 07/11/2019   Migraine    Neck pain 10/14/2020   Pneumonia    PONV (postoperative nausea and vomiting)    Pre-diabetes 10/03/2019   A1C 6.4   Right carpal tunnel syndrome 07/11/2019   S/P carpal tunnel release 08/30/2019   Seasonal allergies 10/08/2016   Shingles 11/14/2020    Family History  Problem Relation Age of Onset   Diabetes Mother    Osteoarthritis Mother    Hypertension Mother    Hyperlipidemia Mother    Arthritis Mother     Past Surgical History:  Procedure Laterality Date   ABDOMINAL HYSTERECTOMY  10/17/2019   APPENDECTOMY     CARPAL TUNNEL RELEASE Right 07/19/2019   Procedure: RIGHT CARPAL TUNNEL RELEASE;  Surgeon: Leandrew Koyanagi, MD;  Location: [facility];  Service: Orthopedics;  Laterality: Right;   CARPAL TUNNEL RELEASE Left 09/13/2019   Procedure: LEFT CARPAL TUNNEL RELEASE;  Surgeon:  Leandrew Koyanagi, MD;  Location: [facility];  Service: Orthopedics;  Laterality: Left;  Bier block   [place]; 2008   CHOLECYSTECTOMY  2011   SHOULDER ARTHROSCOPY WITH DISTAL CLAVICLE RESECTION  02/12/2021   Procedure: SHOULDER ARTHROSCOPY WITH DISTAL CLAVICLE RESECTION;  Surgeon: Leandrew Koyanagi, MD;  Location: [facility];  Service: Orthopedics;;   SHOULDER ARTHROSCOPY WITH ROTATOR CUFF REPAIR AND SUBACROMIAL DECOMPRESSION Left 02/12/2021   Procedure: LEFT SHOULDER ARTHROSCOPY WITH ROTATOR CUFF REPAIR, SUBACROMIAL DECOMPRESSION, EXTENSIVE DEBRIDEMENT;  Surgeon: Leandrew Koyanagi, MD;  Location: [facility];  Service: Orthopedics;  Laterality: Left;   TONSILLECTOMY     tonsilletomy     TUBAL LIGATION  04/2007   VAGINAL HYSTERECTOMY Bilateral 10/17/2019   Procedure: HYSTERECTOMY VAGINAL WITH SALPINGECTOMY;  Surgeon: Chancy Milroy, MD;  Location: [facility];  Service: Gynecology;  Laterality: Bilateral;   Social History   Occupational History   Not on file  Tobacco Use   Smoking status: Never   Smokeless tobacco: Never  Vaping Use   Vaping Use: Never used  Substance and Sexual Activity   Alcohol use: No   Drug use: No   Sexual activity: Yes    Birth control/protection: Surgical

## 2021-07-03 ENCOUNTER — Other Ambulatory Visit: Payer: Self-pay

## 2021-07-03 ENCOUNTER — Emergency Department (HOSPITAL_COMMUNITY)
Admission: EM | Admit: 2021-07-03 | Discharge: 2021-07-04 | Disposition: A | Discharge status: Home / Self Care | Payer: BC Managed Care – PPO | Attending: Emergency Medicine | Admitting: Emergency Medicine

## 2021-07-03 ENCOUNTER — Emergency Department (HOSPITAL_COMMUNITY): Payer: BC Managed Care – PPO

## 2021-07-03 DIAGNOSIS — R202 Paresthesia of skin: Secondary | ICD-10-CM | POA: Diagnosis not present

## 2021-07-03 DIAGNOSIS — Z7984 Long term (current) use of oral hypoglycemic drugs: Secondary | ICD-10-CM | POA: Diagnosis not present

## 2021-07-03 DIAGNOSIS — Z794 Long term (current) use of insulin: Secondary | ICD-10-CM | POA: Diagnosis not present

## 2021-07-03 DIAGNOSIS — E119 Type 2 diabetes mellitus without complications: Secondary | ICD-10-CM | POA: Diagnosis not present

## 2021-07-03 DIAGNOSIS — R6883 Chills (without fever): Secondary | ICD-10-CM | POA: Diagnosis not present

## 2021-07-03 DIAGNOSIS — R2231 Localized swelling, mass and lump, right upper limb: Secondary | ICD-10-CM | POA: Insufficient documentation

## 2021-07-03 DIAGNOSIS — M25511 Pain in right shoulder: Secondary | ICD-10-CM | POA: Diagnosis not present

## 2021-07-03 LAB — BASIC METABOLIC PANEL
Anion gap: 10 (ref 5–15)
BUN: 5 mg/dL — ABNORMAL LOW (ref 6–20)
CO2: 22 mmol/L (ref 22–32)
Calcium: 8.8 mg/dL — ABNORMAL LOW (ref 8.9–10.3)
Chloride: 103 mmol/L (ref 98–111)
Creatinine, Ser: 0.57 mg/dL (ref 0.44–1.00)
GFR, Estimated: >60 mL/min (ref >60)
Glucose, Bld: 243 mg/dL — ABNORMAL HIGH (ref 70–99)
Potassium: 3.9 mmol/L (ref 3.5–5.1)
Sodium: 135 mmol/L (ref 135–145)

## 2021-07-03 LAB — CBC WITH DIFFERENTIAL/PLATELET
Abs Immature Granulocytes: 0.05 10*3/uL (ref 0.00–0.07)
Basophils Absolute: 0.1 10*3/uL (ref 0.0–0.1)
Basophils Relative: 0 %
Eosinophils Absolute: 0.6 10*3/uL — ABNORMAL HIGH (ref 0.0–0.5)
Eosinophils Relative: 4 %
HCT: 39.1 % (ref 36.0–46.0)
Hemoglobin: 12.5 g/dL (ref 12.0–15.0)
Immature Granulocytes: 0 %
Lymphocytes Relative: 22 %
Lymphs Abs: 3.3 10*3/uL (ref 0.7–4.0)
MCH: 26.2 pg (ref 26.0–34.0)
MCHC: 32 g/dL (ref 30.0–36.0)
MCV: 82 fL (ref 80.0–100.0)
Monocytes Absolute: 1.3 10*3/uL — ABNORMAL HIGH (ref 0.1–1.0)
Monocytes Relative: 9 %
Neutro Abs: 9.4 10*3/uL — ABNORMAL HIGH (ref 1.7–7.7)
Neutrophils Relative %: 65 %
Platelets: 266 10*3/uL (ref 150–400)
RBC: 4.77 MIL/uL (ref 3.87–5.11)
RDW: 15.6 % — ABNORMAL HIGH (ref 11.5–15.5)
WBC: 14.6 10*3/uL — ABNORMAL HIGH (ref 4.0–10.5)
nRBC: 0 % (ref 0.0–0.2)

## 2021-07-03 IMAGING — CR DG SHOULDER 2+V*R*
3 series · 3 of 3 positions shown · non-contrast
Comparison: Right shoulder x-ray [DATE].

CLINICAL DATA: Postoperative, pain.

EXAM:
RIGHT SHOULDER - 2+ VIEW

[shoulder grashey]
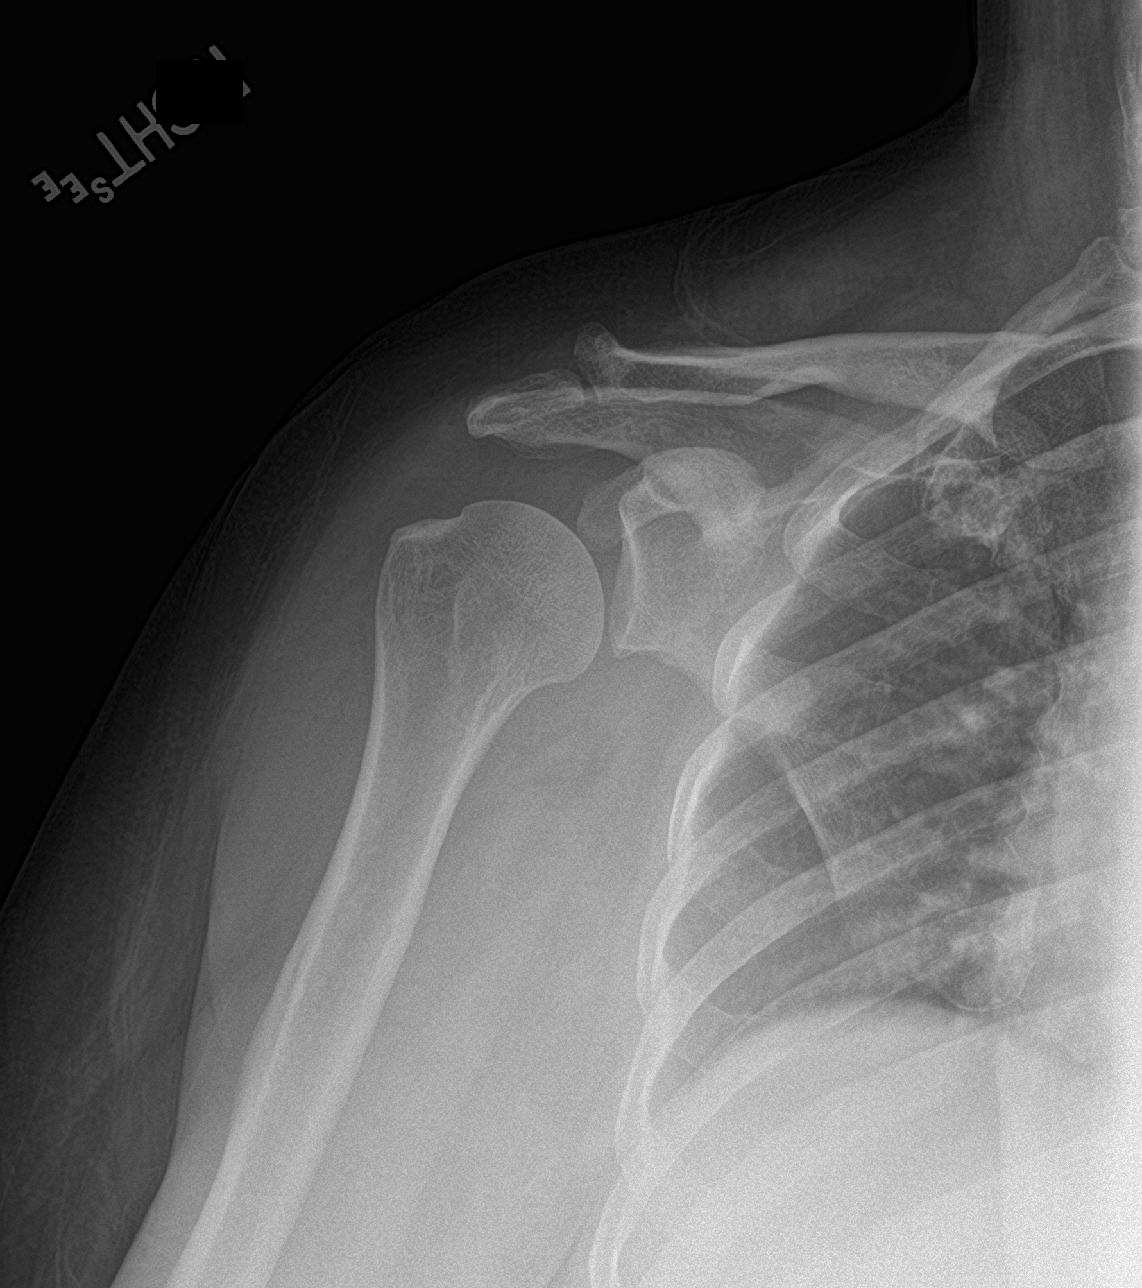

[shoulder y view]
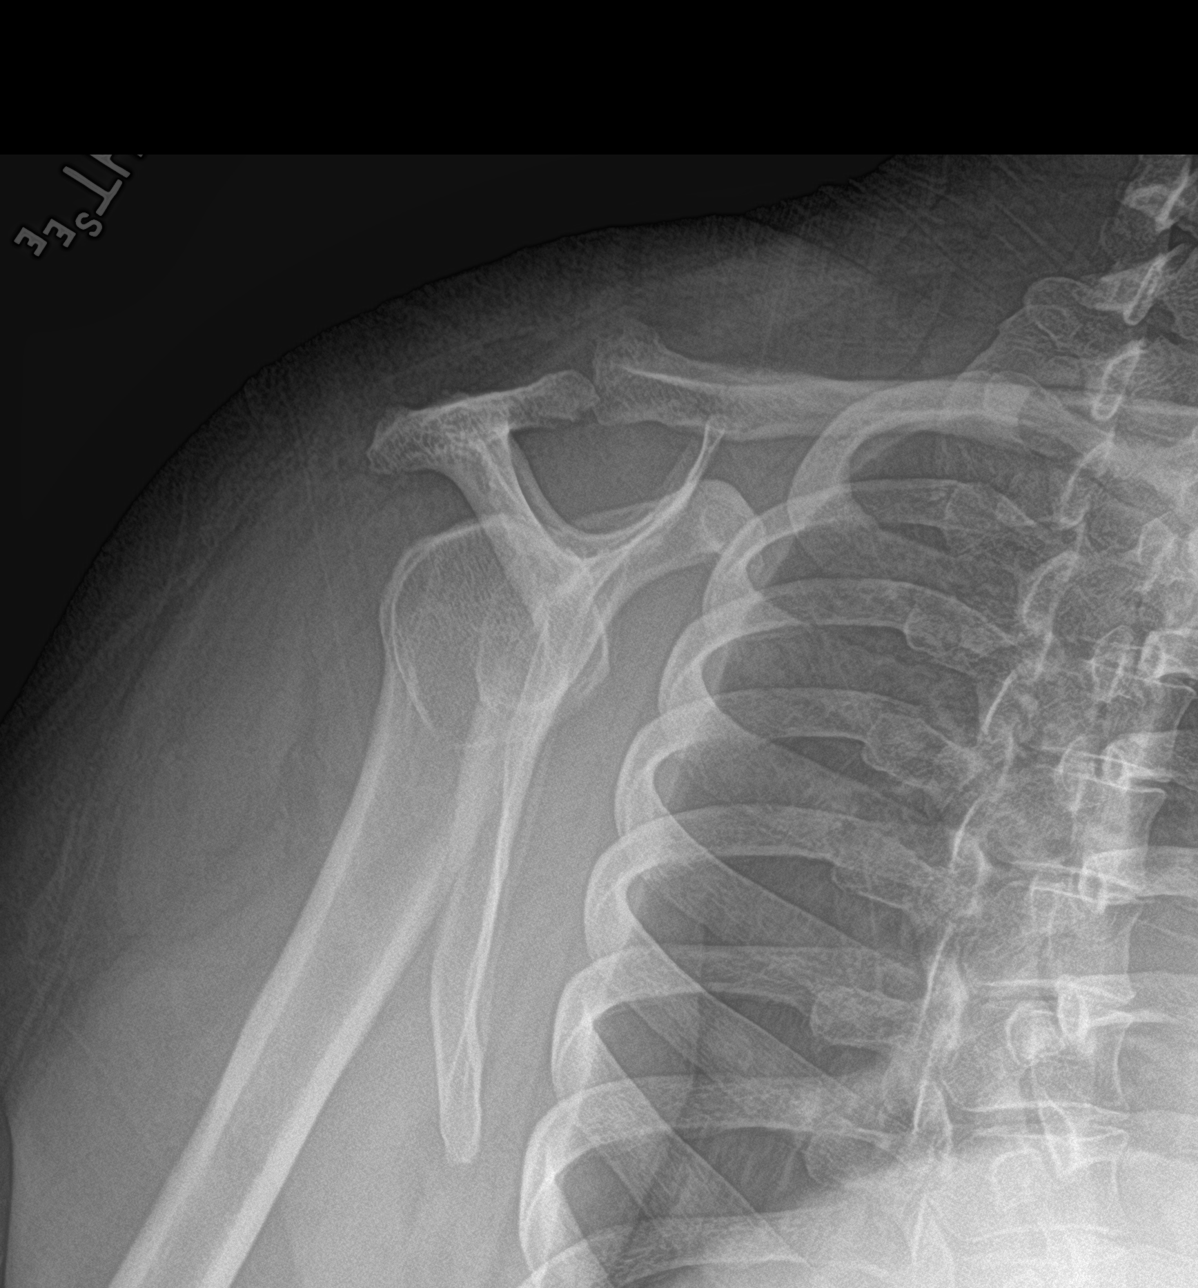

[shoulder ap neutral]
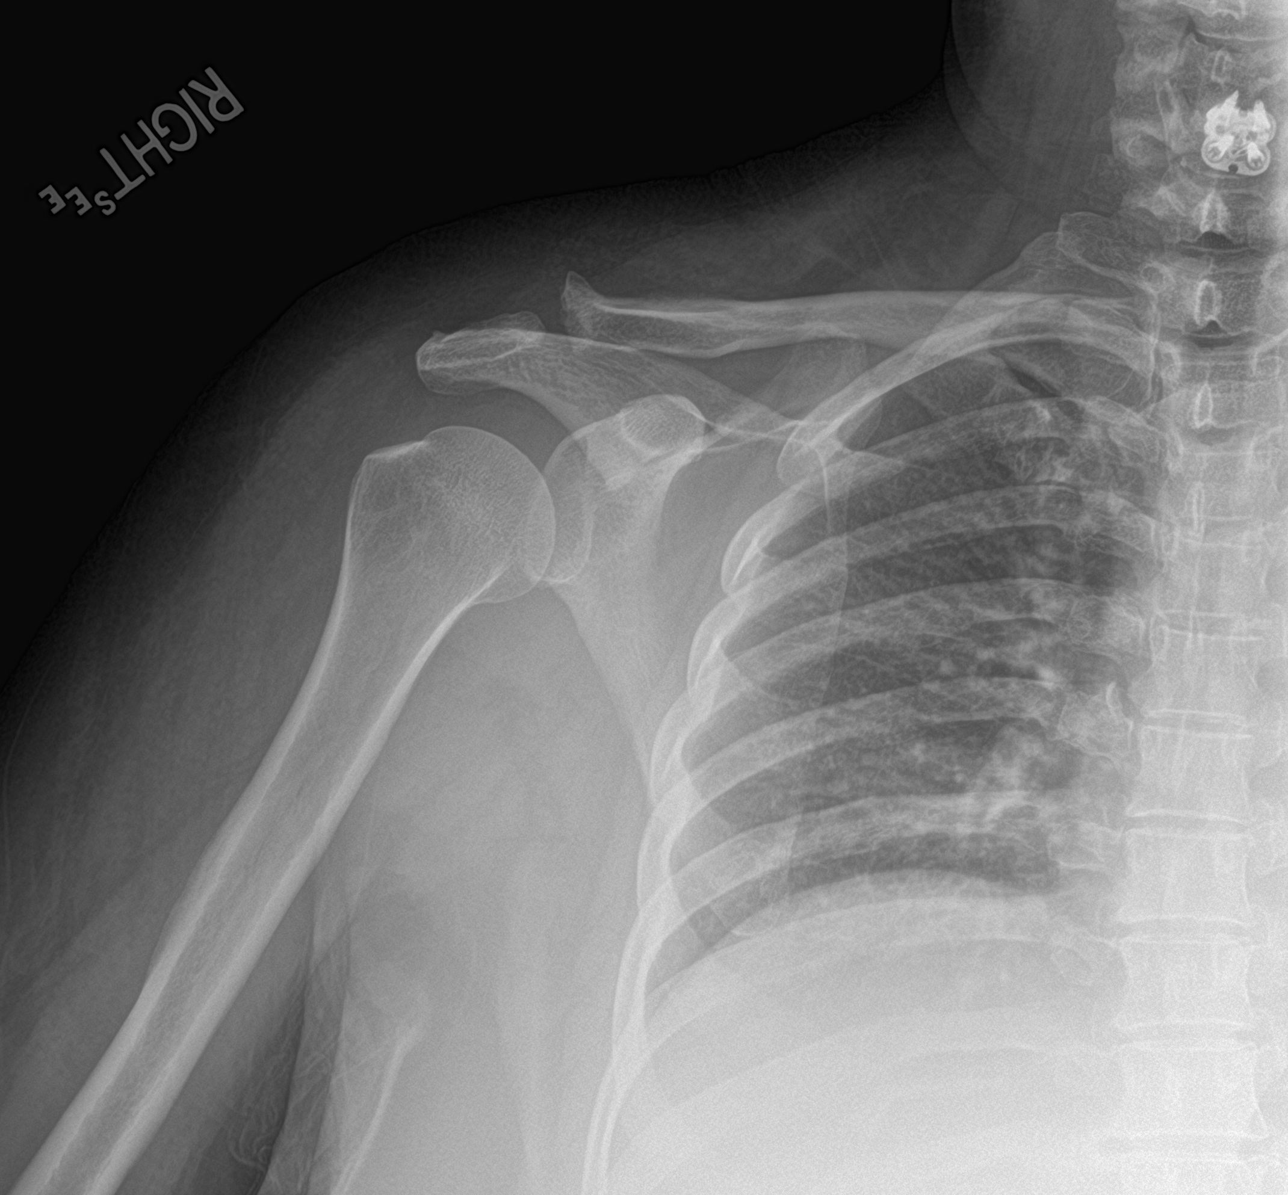

[3 of 3 positions shown; findings below may reference images not displayed]

FINDINGS: No acute fracture or dislocation. Joint spaces are maintained. There
are mild degenerative changes of the acromioclavicular joint similar
to prior study. Soft tissues are within normal limits.
IMPRESSION: 1. No acute bony abnormality.
2. Stable mild degenerative changes of the acromioclavicular joint.

## 2021-07-03 MED ORDER — FENTANYL CITRATE PF 50 MCG/ML IJ SOSY
50.0000 ug | PREFILLED_SYRINGE | Freq: Once | INTRAMUSCULAR | Status: AC
  Administered 2021-07-03: 50 ug via INTRAMUSCULAR
  Filled 2021-07-03: qty 1

## 2021-07-03 MED ORDER — HYDROMORPHONE HCL 1 MG/ML IJ SOLN
1.0000 mg | Freq: Once | INTRAMUSCULAR | Status: AC
  Administered 2021-07-04: 1 mg via INTRAVENOUS
  Filled 2021-07-03: qty 1

## 2021-07-03 MED ORDER — MORPHINE SULFATE (PF) 4 MG/ML IV SOLN: 4.0000 mg | Freq: Once | INTRAVENOUS | Status: DC

## 2021-07-03 MED ORDER — DEXAMETHASONE SODIUM PHOSPHATE 10 MG/ML IJ SOLN
10.0000 mg | Freq: Once | INTRAMUSCULAR | Status: AC
  Administered 2021-07-04: 10 mg via INTRAMUSCULAR
  Filled 2021-07-03: qty 1

## 2021-07-03 MED ORDER — KETOROLAC TROMETHAMINE 15 MG/ML IJ SOLN
15.0000 mg | Freq: Once | INTRAMUSCULAR | Status: AC
  Administered 2021-07-04: 15 mg via INTRAMUSCULAR
  Filled 2021-07-03: qty 1

## 2021-07-03 NOTE — ED Provider Triage Note (Signed)
Emergency Medicine Provider Triage Evaluation Note  Alexandra Norris , a 46 y.o. female  was evaluated in triage.  Pt complains of right shoulder x 2 days. She has throbbing to the area. Her pain medications are not working for her. Denies fever, chills, nausea, vomiting, shortness of breath. Last dose of percocet was 2 tablets at 5 pm.  Per pt chart review: Pt had a right shoulder surgery completed on 06/25/21. Pt was evaluated by her surgeon Dr. Erlinda Hong on yesterday 07/02/21. A that time no noted signs of infection noted. She had her sutures removed.    Review of Systems  Positive: As per HPI above Negative: Fever, chills,   Physical Exam  BP 125/78 (BP Location: Left Arm)    Pulse (!) 101    Temp 98.5 F (36.9 C) (Oral)    Resp 18    LMP 09/20/2019 (Exact Date)    SpO2 97%  Gen:   Awake, no distress   Resp:  Normal effort  MSK:   Moves extremities without difficulty  Other:  Well healing incision noted to right anterior shoulder. No surrounding erythema or drainage noted. 5/5 grip strength noted. Sensation intact to right hand.   Medical Decision Making  Medically screening exam initiated at 8:37 PM.  Appropriate orders placed.  Alexandra Norris was informed that the remainder of the evaluation will be completed by another provider, this initial triage assessment does not replace that evaluation, and the importance of remaining in the ED until their evaluation is complete.   Daron Breeding A, PA-C 07/03/21 2055

## 2021-07-03 NOTE — ED Triage Notes (Signed)
Pt here for increased pain after surgery on R shoulder last week, pain has been 10/10 for last 2 days. Pt reports pain medication has not been helping. Pt reports numbness and tingling in R hand, w/ pain shooting from shoulder down into hand.

## 2021-07-03 NOTE — ED Notes (Signed)
Patient reporting 10/10 pain to right shoulder

## 2021-07-04 ENCOUNTER — Other Ambulatory Visit (HOSPITAL_COMMUNITY): Payer: Self-pay

## 2021-07-04 MED ORDER — DIAZEPAM 5 MG PO TABS
5.0000 mg | ORAL_TABLET | Freq: Two times a day (BID) | ORAL | 0 refills | Status: DC | PRN
  Filled 2021-07-04: qty 10, 5d supply, fill #0

## 2021-07-04 MED ORDER — METHOCARBAMOL 500 MG PO TABS
500.0000 mg | ORAL_TABLET | Freq: Once | ORAL | Status: AC
  Administered 2021-07-04: 500 mg via ORAL
  Filled 2021-07-04: qty 1

## 2021-07-04 MED ORDER — NAPROXEN 500 MG PO TABS
500.0000 mg | ORAL_TABLET | Freq: Two times a day (BID) | ORAL | 0 refills | Status: DC
  Filled 2021-07-04: qty 30, 15d supply, fill #0

## 2021-07-04 MED ORDER — HYDROMORPHONE HCL 1 MG/ML IJ SOLN
1.0000 mg | Freq: Once | INTRAMUSCULAR | Status: AC
  Administered 2021-07-04: 1 mg via INTRAVENOUS
  Filled 2021-07-04: qty 1

## 2021-07-04 NOTE — ED Provider Notes (Signed)
[facility] EMERGENCY DEPARTMENT Provider Note   CSN: [id number] Arrival date & time: 07/03/21  1921     History  Chief Complaint  Patient presents with   Shoulder Pain   Post-op Problem    Alexandra Norris is a 46 y.o. female with past medical history of obesity, diabetes who presents after arthroscopic surgery to the right shoulder last week.  Patient reports that he has had some improvement since the surgery with some pain, slightly decreased movement of the right arm, however for the last 2 days she has had significantly increasing pain, swelling of the right shoulder, shooting pain from the shoulder down to the hand, with tingling of the whole arm, to the fingertips of the right hand.  She feels like she has decreased strength.  He has been taking her oxycodone, prednisone at home with no improvement of her pain.  She was just seen in the office yesterday and they told her that these are expected changes, and does not give her any additional pain control or management. Reports some chills, no objective fever.   Shoulder Pain     Home Medications Prior to Admission medications   Medication Sig Start Date End Date Taking? Authorizing Provider  albuterol (VENTOLIN HFA) 108 (90 Base) MCG/ACT inhaler Inhale 2 puffs into the lungs in the morning and at bedtime. Patient not taking: Reported on 07/01/2021 09/09/20   Early, Coralee Pesa, NP  atorvastatin (LIPITOR) 20 MG tablet Take 1 tablet (20 mg total) by mouth at bedtime. Patient not taking: Reported on 07/01/2021 05/20/21   Orma Render, NP  Blood Glucose Monitoring Suppl (CONTOUR NEXT MONITOR) w/Device KIT USE SEGUN LO INDICADO 3 VECES AL DIA ANTES DE LAS COMIDAS 08/05/20 08/05/21  Charlott Rakes, MD  COVID-19 mRNA bivalent vaccine, Pfizer, (PFIZER COVID-19 VAC BIVALENT) injection Inject into the muscle. 04/22/21   Carlyle Basques, MD  Dulaglutide (TRULICITY) 1.5 UL/8.4TX SOPN Inject 1.5 mg into the skin once a  week. 05/20/21   Early, Coralee Pesa, NP  glucose blood test strip USE SEGUN LO INDICADO 3 VECES AL DIA ANTES DE LAS COMIDAS Patient taking differently: USE SEGUN LO INDICADO 3 VECES AL DIA ANTES DE LAS COMIDAS 08/05/20 08/05/21  Charlott Rakes, MD  ibuprofen (ADVIL) 400 MG tablet Take 400 mg by mouth every 6 (six) hours as needed.    [provider]  lisinopril (ZESTRIL) 10 MG tablet Take 1 tablet (10 mg total) by mouth daily. 04/22/21   Orma Render, NP  metFORMIN (GLUCOPHAGE) 500 MG tablet Take 1 tablet (500 mg total) by mouth daily with breakfast AND 2 tablets (1,000 mg total) with dinner 05/20/21   Early, Coralee Pesa, NP  Microlet Lancets MISC USE TO CHECK BLOOD SUGAR 3 VECES AL DIA 08/06/20 08/06/21  Charlott Rakes, MD  Multiple Vitamin (MULTIVITAMIN WITH MINERALS) TABS tablet Take 1 tablet by mouth daily in the afternoon.    [provider]  ondansetron (ZOFRAN) 4 MG tablet Take 1 tablet (4 mg total) by mouth every 8 (eight) hours as needed for nausea or vomiting. Patient not taking: Reported on 07/01/2021 02/19/21   Aundra Dubin, PA-C  oxyCODONE-acetaminophen (PERCOCET) 5-325 MG tablet Take 1-2 tablets by mouth every 8 (eight) hours as needed for severe pain. Patient not taking: Reported on 07/01/2021 06/25/21   Leandrew Koyanagi, MD  TRUEplus Lancets 28G MISC USE SEGUN LO INDICADO 3 VECES AL DIA ANTES DE LAS COMIDAS 08/05/20 08/05/21  Charlott Rakes, MD  Allergies    Patient has no known allergies.    Review of Systems   Review of Systems  Musculoskeletal:  Positive for arthralgias and joint swelling.  All other systems reviewed and are negative.  Physical Exam Updated Vital Signs BP 113/63    Pulse 92    Temp 99 F (37.2 C) (Oral)    Resp 18    LMP 09/20/2019 (Exact Date)    SpO2 95%  Physical Exam Vitals and nursing note reviewed.  Constitutional:      General: She is not in acute distress.    Appearance: Normal appearance.  HENT:     Head: Normocephalic and atraumatic.   Eyes:     General:        Right eye: No discharge.        Left eye: No discharge.  Cardiovascular:     Rate and Rhythm: Normal rate and regular rhythm.  Pulmonary:     Effort: Pulmonary effort is normal. No respiratory distress.  Musculoskeletal:        General: No deformity.     Comments: Globally decreased drink of the right arm, including decreased grip strength of the right hand.  Difficult to assess further secondary to neurologic deficit or pain as patient is in very significant pain.  Skin:    General: Skin is warm and dry.     Comments: Surgical arthroscopy sites are intact, no evidence of redness, purulent drainage.  The rest of the skin around the shoulder is normal-appearing, no significant redness.  It is difficult to assess for swelling as patient has a very large body habitus, and I did not see patient prior to her operation.  Neurological:     Mental Status: She is alert and oriented to person, place, and time.  Psychiatric:        Mood and Affect: Mood normal.        Behavior: Behavior normal.    ED Results / Procedures / Treatments   Labs (all labs ordered are listed, but only abnormal results are displayed) Labs Reviewed  BASIC METABOLIC PANEL - Abnormal; Notable for the following components:      Result Value   Glucose, Bld 243 (*)    BUN 5 (*)    Calcium 8.8 (*)    All other components within normal limits  CBC WITH DIFFERENTIAL/PLATELET - Abnormal; Notable for the following components:   WBC 14.6 (*)    RDW 15.6 (*)    Neutro Abs 9.4 (*)    Monocytes Absolute 1.3 (*)    Eosinophils Absolute 0.6 (*)    All other components within normal limits    EKG None  Radiology DG Shoulder Right  Result Date: 07/03/2021 CLINICAL DATA:  Postoperative, pain. EXAM: RIGHT SHOULDER - 2+ VIEW COMPARISON:  Right shoulder x-ray 03/26/2021. FINDINGS: No acute fracture or dislocation. Joint spaces are maintained. There are mild degenerative changes of the  acromioclavicular joint similar to prior study. Soft tissues are within normal limits. IMPRESSION: 1. No acute bony abnormality. 2. Stable mild degenerative changes of the acromioclavicular joint. Electronically Signed   By: Ronney Asters M.D.   On: 07/03/2021 21:32    Procedures Procedures    Medications Ordered in ED Medications  fentaNYL (SUBLIMAZE) injection 50 mcg (50 mcg Intramuscular Given 07/03/21 2052)  ketorolac (TORADOL) 15 MG/ML injection 15 mg (15 mg Intramuscular Given 07/04/21 0007)  dexamethasone (DECADRON) injection 10 mg (10 mg Intramuscular Given 07/04/21 0007)  HYDROmorphone (DILAUDID) injection 1 mg (  1 mg Intravenous Given 07/04/21 0008)    ED Course/ Medical Decision Making/ A&P Clinical Course as of 07/04/21 0032  Thu Jul 03, 2021  2351 Dr. Durward Fortes reports it does not sound like acute infection -- control pain, follow up with Dr. Erlinda Hong in the morning. [CP]    Clinical Course User Index [CP] Anselmo Pickler, PA-C                           Medical Decision Making Risk Prescription drug management.   This patient presents to the ED for concern of Right shoulder pain, postop complication, this involves an extensive number of treatment options, and is a complaint that carries with it a high risk of complications and morbidity. The emergent differential diagnosis includes, but is not limited to, septic arthritis, surgical infection, cellulitis, inflammation and pain, frozen shoulder versus other, this is not an exhaustive differential.  Co morbidities that complicate the patient evaluation: Obesity, diabetes Social Determinants of Health: Patient is Spanish-speaking, translation services used throughout the duration of the visit.  External records from outside source obtained and reviewed including recent orthopedic visits, office notes, operation note, by Dr. Erlinda Hong.  Physical Exam: Physical exam performed. The pertinent findings include: Decreased range of motion  actively, intact range of motion passively.  No redness, purulent drainage.  Decreased strength globally of the right upper extremity, difficult to assess whether secondary to neurologic deficit versus pain  Lab Tests: I Ordered, and personally interpreted labs.  The pertinent results include: CBC with a white count of 14.6.  Patient is currently on prednisone, could have some leukocytosis secondary to this.  Her BMP is overall unremarkable other than a moderate hyperglycemia with glucose of 243.   Imaging Studies: I ordered imaging studies including radiographic imaging of the right shoulder. I independently visualized and interpreted imaging which showed no acute abnormality, effusion, degenerative changes similar compared to baseline. I agree with the radiologist interpretation.   I consulted with Dr. Durward Fortes who reports that based on her description and physical exam he also doubts acute infection, septic arthritis.  With recommendations to control patient's pain even if it requires p.o. prescription for Dilaudid, and to follow-up with Dr. Erlinda Hong in the morning.  12:41 AM Care of Cleon Thoma  transferred to [facility] and Dr. Ralene Bathe at the end of my shift as the patient will require reassessment after administration of pain medications.  Plan at time of handoff is if patient has reasonable improvement of pain will discharge for follow-up in the office first thing in the morning.   Final Clinical Impression(s) / ED Diagnoses Final diagnoses:  None    Rx / DC Orders ED Discharge Orders     None         Dorien Chihuahua 07/04/21 0043    Wyvonnia Dusky, MD 07/04/21 [phone]

## 2021-07-04 NOTE — Discharge Instructions (Addendum)
We spoke with your orthopedic surgeons may recommend following up first thing in the morning for evaluation of the shoulder.  We have prescribed some additional medications to take with the Percocet you were prescribed after surgery.  The on-call surgeon recommended applying ice to the affected area, taking these medications, and following up first thing in the morning. Do not drive or drink alcohol after taking these medications as they may make you drowsy and impair your judgment.  --  Hablamos con sus cirujanos ortopdicos y es posible que le recomienden un seguimiento a primera hora de la maana para evaluar el hombro. Le hemos recetado algunos medicamentos adicionales para tomar con Data processing manager recetaron despus de la [place]. El cirujano de guardia recomend aplicar hielo en el rea afectada, tomar estos medicamentos y Field seismologist un seguimiento a primera hora de Futures trader. No conduzca ni beba alcohol despus de tomar [employer], ya que pueden causarle somnolencia y Print production planner su juicio.

## 2021-07-04 NOTE — ED Provider Notes (Signed)
1:45 AM The patient has been reassessed.  She is sitting upright.  Reports improvement in her pain following IV medications.  She remains neurovascularly intact.  Admission considered given degree of discomfort, though physical exam presently with low suspicion for septic joint.  Have discussed outpatient follow-up with the patient and she expresses comfort with this.  Plan for patient discharged to follow-up with Dr. Erlinda Hong in the office in the morning.   Patient reports having an additional 10 tablets of Percocet left from her surgery.  I have encouraged her to take 2 tablets every 6 hours for management of her pain.  She reports that she was only using 1 tablet every 8 hours on many occasion.  We will also add an anti-inflammatory as well as a muscle relaxer for pain control.  Return precautions discussed and provided. Patient discharged in stable condition with no unaddressed concerns.   Antonietta Breach, PA-C 07/04/21 0156    Quintella Reichert, MD 07/04/21 [phone]

## 2021-07-07 ENCOUNTER — Ambulatory Visit: Payer: BC Managed Care – PPO | Admitting: Diagnostic Neuroimaging

## 2021-07-07 ENCOUNTER — Encounter: Payer: Self-pay | Admitting: Diagnostic Neuroimaging

## 2021-07-08 ENCOUNTER — Ambulatory Visit (INDEPENDENT_AMBULATORY_CARE_PROVIDER_SITE_OTHER): Payer: BC Managed Care – PPO | Admitting: Nurse Practitioner

## 2021-07-08 ENCOUNTER — Encounter (HOSPITAL_BASED_OUTPATIENT_CLINIC_OR_DEPARTMENT_OTHER): Payer: Self-pay | Admitting: Nurse Practitioner

## 2021-07-08 ENCOUNTER — Other Ambulatory Visit (HOSPITAL_COMMUNITY): Payer: Self-pay

## 2021-07-08 ENCOUNTER — Ambulatory Visit (INDEPENDENT_AMBULATORY_CARE_PROVIDER_SITE_OTHER): Payer: BC Managed Care – PPO | Admitting: Orthopaedic Surgery

## 2021-07-08 ENCOUNTER — Other Ambulatory Visit: Payer: Self-pay

## 2021-07-08 ENCOUNTER — Encounter: Payer: Self-pay | Admitting: Orthopaedic Surgery

## 2021-07-08 VITALS — BP 117/72 | HR 82 | Temp 98.4°F | Wt 236.2 lb

## 2021-07-08 VITALS — Ht 61.0 in | Wt 236.0 lb

## 2021-07-08 DIAGNOSIS — D72828 Other elevated white blood cell count: Secondary | ICD-10-CM

## 2021-07-08 DIAGNOSIS — Z9889 Other specified postprocedural states: Secondary | ICD-10-CM

## 2021-07-08 DIAGNOSIS — S43431A Superior glenoid labrum lesion of right shoulder, initial encounter: Secondary | ICD-10-CM

## 2021-07-08 HISTORY — DX: Other specified postprocedural states: Z98.890

## 2021-07-08 LAB — CBC WITH DIFFERENTIAL/PLATELET
Basophils Absolute: 0.1 10*3/uL (ref 0.0–0.2)
Basos: 1 %
EOS (ABSOLUTE): 0.9 10*3/uL — ABNORMAL HIGH (ref 0.0–0.4)
Eos: 6 %
Hematocrit: 40.3 % (ref 34.0–46.6)
Hemoglobin: 13 g/dL (ref 11.1–15.9)
Immature Grans (Abs): 0 10*3/uL (ref 0.0–0.1)
Immature Granulocytes: 0 %
Lymphocytes Absolute: 4 10*3/uL — ABNORMAL HIGH (ref 0.7–3.1)
Lymphs: 29 %
MCH: 25.7 pg — ABNORMAL LOW (ref 26.6–33.0)
MCHC: 32.3 g/dL (ref 31.5–35.7)
MCV: 80 fL (ref 79–97)
Monocytes Absolute: 0.8 10*3/uL (ref 0.1–0.9)
Monocytes: 6 %
Neutrophils Absolute: 8.2 10*3/uL — ABNORMAL HIGH (ref 1.4–7.0)
Neutrophils: 58 %
Platelets: 363 10*3/uL (ref 150–450)
RBC: 5.06 x10E6/uL (ref 3.77–5.28)
RDW: 15.4 % (ref 11.7–15.4)
WBC: 13.9 10*3/uL — ABNORMAL HIGH (ref 3.4–10.8)

## 2021-07-08 MED ORDER — OXYCODONE-ACETAMINOPHEN 5-325 MG PO TABS
1.0000 | ORAL_TABLET | Freq: Every day | ORAL | 0 refills | Status: DC | PRN
  Filled 2021-07-08: qty 18, 9d supply, fill #0
  Filled 2021-07-08: qty 2, 1d supply, fill #0

## 2021-07-08 NOTE — Patient Instructions (Signed)
Si comienza a Social worker, escalofros, enrojecimiento o nueva hinchazn en el hombro, infrmese a m o al cirujano del hombro de inmediato. El aumento en sus recuentos de glbulos blancos parece deberse a una reaccin de la ciruga y no son demasiado preocupantes segn cmo se sienta. Si esto cambia, quiero que me lo hagas saber.

## 2021-07-08 NOTE — Progress Notes (Signed)
Post-Op Visit Note   Patient: Alexandra Norris           Date of Birth: Jan 02, 1976           MRN: [id number] Visit Date: 07/08/2021 PCP: Orma Render, NP   Assessment & Plan:  Chief Complaint:  Chief Complaint  Patient presents with   Right Shoulder - Pain    Arthroscopy 06/25/2021   Visit Diagnoses:  1. S/P arthroscopy of right shoulder   2. Type 1 superior labral anterior-to-posterior (SLAP) tear of right shoulder, initial encounter     Plan: Quad Lupe is 2-week status post right shoulder scope biceps tenotomy.  She did go to the ER on the 26th for swelling and pain but they were able to get this under control and she is feeling much better now.  Examination of the right shoulder shows healed surgical scars.  Her active range of motion is actually pretty good for 2 weeks.  She has appointment for physical therapy on Thursday.  We will plan on seeing her back in another 6 weeks.  Follow-Up Instructions: Return in about 6 weeks (around 08/19/2021).   Orders:  No orders of the defined types were placed in this encounter.  No orders of the defined types were placed in this encounter.   Imaging: No results found.  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   Encounter to establish care 09/09/2020   Fatty liver disease, nonalcoholic 91/50/5697   Cervical disc disorder at C5-C6 level with radiculopathy 04/03/2020   Arthritis    Nasal obstruction 02/18/2016   DM2 (diabetes  mellitus, type 2) ([place]) 10/21/2015   Vitamin D deficiency 09/25/2015   GASTROESOPHAGEAL REFLUX DISEASE 08/14/2009   Body mass index (BMI) 45.0-49.9, adult ([place]) 05/30/2007   Moderate persistent asthma with acute exacerbation 12/06/2006   Past Medical History:  Diagnosis Date   Anemia    Anemia    Anxiety    Arthritis    Asthma    Bilateral chronic knee pain 09/25/2015   Carpal tunnel syndrome    Depression    DEPRESSION, SITUATIONAL, PROLONGED 02/05/2009   Qualifier: Diagnosis of  By: Sherilyn Cooter  MD, Khary     Diabetes mellitus without complication ([place])    Dizziness 04/22/2021   Dyspnea    Elevated blood-pressure reading, without diagnosis of hypertension 06/17/2020   Endometriosis    Fatigue 04/22/2021   History of blood transfusion 1999   w/vaginal delivery   History of bronchitis    "I've stayed in hospital 2 X w/bronchitis"   Hyperlipidemia    Hypertension    Impingement syndrome of left shoulder    Influenza vaccine needed 04/03/2020   Left carpal tunnel syndrome 07/11/2019   Migraine    Neck pain 10/14/2020   Pneumonia    PONV (postoperative nausea and vomiting)    Pre-diabetes 10/03/2019   A1C 6.4   Right carpal tunnel syndrome 07/11/2019   S/P carpal tunnel release 08/30/2019   Seasonal allergies 10/08/2016   Shingles 11/14/2020    Family History  Problem Relation Age of  Onset   Diabetes Mother    Osteoarthritis Mother    Hypertension Mother    Hyperlipidemia Mother    Arthritis Mother     Past Surgical History:  Procedure Laterality Date   ABDOMINAL HYSTERECTOMY  10/17/2019   APPENDECTOMY     CARPAL TUNNEL RELEASE Right 07/19/2019   Procedure: RIGHT CARPAL TUNNEL RELEASE;  Surgeon: Leandrew Koyanagi, MD;  Location: [facility];  Service: Orthopedics;  Laterality: Right;   CARPAL TUNNEL RELEASE Left 09/13/2019   Procedure: LEFT CARPAL TUNNEL RELEASE;  Surgeon: Leandrew Koyanagi, MD;  Location: [facility];  Service: Orthopedics;   Laterality: Left;  Bier block   [place]; 2008   CHOLECYSTECTOMY  2011   SHOULDER ARTHROSCOPY WITH DISTAL CLAVICLE RESECTION  02/12/2021   Procedure: SHOULDER ARTHROSCOPY WITH DISTAL CLAVICLE RESECTION;  Surgeon: Leandrew Koyanagi, MD;  Location: [facility];  Service: Orthopedics;;   SHOULDER ARTHROSCOPY WITH ROTATOR CUFF REPAIR AND SUBACROMIAL DECOMPRESSION Left 02/12/2021   Procedure: LEFT SHOULDER ARTHROSCOPY WITH ROTATOR CUFF REPAIR, SUBACROMIAL DECOMPRESSION, EXTENSIVE DEBRIDEMENT;  Surgeon: Leandrew Koyanagi, MD;  Location: [facility];  Service: Orthopedics;  Laterality: Left;   TONSILLECTOMY     tonsilletomy     TUBAL LIGATION  04/2007   VAGINAL HYSTERECTOMY Bilateral 10/17/2019   Procedure: HYSTERECTOMY VAGINAL WITH SALPINGECTOMY;  Surgeon: Chancy Milroy, MD;  Location: [facility];  Service: Gynecology;  Laterality: Bilateral;   Social History   Occupational History   Not on file  Tobacco Use   Smoking status: Never   Smokeless tobacco: Never  Vaping Use   Vaping Use: Never used  Substance and Sexual Activity   Alcohol use: No   Drug use: No   Sexual activity: Yes    Birth control/protection: Surgical

## 2021-07-08 NOTE — Progress Notes (Signed)
Established Patient Office Visit  Subjective:  Patient ID: Alexandra Norris, female    DOB: 1975-08-30  Age: 46 y.o. MRN: [id number]  CC: No chief complaint on file.   HPI Alexandra Norris presents for evaluation of lab work recently performed in the emergency room. She is here with her husband today who serves as her interpreter.  They declined formal interpretation services today.  Deatra was recently seen in the emergency room for pain and swelling in the right shoulder following recent shoulder surgery.  At that time laboratory evaluation did reveal a slight elevation in her white blood counts.  There were no findings suggestive of infection in the surgical site or septic arthritis present.  They did recommend that she follow-up with her PCP based on the elevated white blood counts.  She has since seen the orthopedic surgeon and he reports that she is doing well with no concerning findings.  She denies any fevers, chills, body aches, weight loss, nausea, vomiting, redness or drainage from the surgical wound.  She does tell me that her fasting blood glucose levels have significantly improved.  This morning her blood glucose was 117. She has been monitoring her diet and working to reduce her carbohydrate intake.  She is not currently using the continuous blood glucose monitor as this was too difficult to keep in place however she is monitoring her blood sugars with fingersticks.    No Known Allergies  ROS Review of Systems All review of systems negative except what is listed in the HPI    Objective:    Physical Exam Vitals and nursing note reviewed.  Constitutional:      Appearance: Normal appearance.  HENT:     Head: Normocephalic.  Eyes:     Extraocular Movements: Extraocular movements intact.     Conjunctiva/sclera: Conjunctivae normal.     Pupils: Pupils are equal, round, and reactive to light.  Neck:     Vascular: No carotid bruit.   Cardiovascular:     Rate and Rhythm: Normal rate and regular rhythm.     Pulses: Normal pulses.     Heart sounds: Normal heart sounds.  Pulmonary:     Effort: Pulmonary effort is normal.     Breath sounds: Normal breath sounds.  Abdominal:     General: Abdomen is flat. Bowel sounds are normal. There is no distension.     Palpations: Abdomen is soft.     Tenderness: There is no abdominal tenderness. There is no right CVA tenderness, left CVA tenderness or guarding.  Musculoskeletal:        General: Tenderness present.     Cervical back: Normal range of motion.     Right lower leg: No edema.     Left lower leg: No edema.     Comments: Tenderness and decreased range of motion noted in the right shoulder due to recent surgical intervention.  No signs of infection present  Skin:    General: Skin is warm and dry.     Capillary Refill: Capillary refill takes less than 2 seconds.  Neurological:     General: No focal deficit present.     Mental Status: She is alert and oriented to person, place, and time.  Psychiatric:        Mood and Affect: Mood normal.        Behavior: Behavior normal.        Thought Content: Thought content normal.        Judgment: Judgment normal.  LMP 09/20/2019 (Exact Date)  Wt Readings from Last 3 Encounters:  07/08/21 236 lb (107 kg)  07/01/21 236 lb (107 kg)  06/25/21 236 lb 8.9 oz (107.3 kg)      Assessment & Plan:   Problem List Items Addressed This Visit   None Visit Diagnoses     Other elevated white blood cell (WBC) count    -  Primary   Relevant Orders   CBC with Differential/Platelet     Recent elevation in white blood counts noted on ED evaluation. No signs of systemic or localized infection present today.  Patient did have recent shoulder surgery prior to evaluation in the ED. Discussed with patient the likelihood that the elevation in white blood counts are stemming from recent surgical intervention and not infection cause.  Physical  exam is benign today with no indications of any infectious etiology.  Will obtain repeat CBC today to monitor white blood counts and ensure they are going down. We will make changes to plan of care based on laboratory findings if necessary.  No orders of the defined types were placed in this encounter.   Follow-up: Return for already scheduled.    Orma Render, NP

## 2021-07-09 ENCOUNTER — Other Ambulatory Visit (HOSPITAL_COMMUNITY): Payer: Self-pay

## 2021-07-09 NOTE — Therapy (Signed)
OUTPATIENT PHYSICAL THERAPY SHOULDER EVALUATION   Patient Name: Alexandra Norris MRN: [id number] DOB:10-Jun-1975, 46 y.o., female Today's Date: 07/10/2021   PT End of Session - 07/10/21 1515     Visit Number 1    Number of Visits 20    Date for PT Re-Evaluation 09/18/21    Authorization Type [employer]    Authorization - Number of Visits 29    PT Start Time 1514    PT Stop Time 1548    PT Time Calculation (min) 34 min    Activity Tolerance Patient tolerated treatment well    Behavior During Therapy [facility] for tasks assessed/performed             Past Medical History:  Diagnosis Date   Anemia    Anemia    Anxiety    Arthritis    Asthma    Bilateral chronic knee pain 09/25/2015   Carpal tunnel syndrome    Depression    DEPRESSION, SITUATIONAL, PROLONGED 02/05/2009   Qualifier: Diagnosis of  By: Sherilyn Cooter  MD, Khary     Diabetes mellitus without complication ([place])    Dizziness 04/22/2021   Dyspnea    Elevated blood-pressure reading, without diagnosis of hypertension 06/17/2020   Endometriosis    Fatigue 04/22/2021   History of blood transfusion 1999   w/vaginal delivery   History of bronchitis    "I've stayed in hospital 2 X w/bronchitis"   Hyperlipidemia    Hypertension    Impingement syndrome of left shoulder    Influenza vaccine needed 04/03/2020   Left carpal tunnel syndrome 07/11/2019   Migraine    Neck pain 10/14/2020   Pneumonia    PONV (postoperative nausea and vomiting)    Pre-diabetes 10/03/2019   A1C 6.4   Right carpal tunnel syndrome 07/11/2019   S/P carpal tunnel release 08/30/2019   Seasonal allergies 10/08/2016   Shingles 11/14/2020   Past Surgical History:  Procedure Laterality Date   ABDOMINAL HYSTERECTOMY  10/17/2019   APPENDECTOMY     CARPAL TUNNEL RELEASE Right 07/19/2019   Procedure: RIGHT CARPAL TUNNEL RELEASE;  Surgeon: Leandrew Koyanagi, MD;  Location: [place];  Service: Orthopedics;  Laterality: Right;   CARPAL  TUNNEL RELEASE Left 09/13/2019   Procedure: LEFT CARPAL TUNNEL RELEASE;  Surgeon: Leandrew Koyanagi, MD;  Location: [place];  Service: Orthopedics;  Laterality: Left;  Bier block   [place]; 2008   CHOLECYSTECTOMY  2011   SHOULDER ARTHROSCOPY WITH DISTAL CLAVICLE RESECTION  02/12/2021   Procedure: SHOULDER ARTHROSCOPY WITH DISTAL CLAVICLE RESECTION;  Surgeon: Leandrew Koyanagi, MD;  Location: [place];  Service: Orthopedics;;   SHOULDER ARTHROSCOPY WITH ROTATOR CUFF REPAIR AND SUBACROMIAL DECOMPRESSION Left 02/12/2021   Procedure: LEFT SHOULDER ARTHROSCOPY WITH ROTATOR CUFF REPAIR, SUBACROMIAL DECOMPRESSION, EXTENSIVE DEBRIDEMENT;  Surgeon: Leandrew Koyanagi, MD;  Location: [place];  Service: Orthopedics;  Laterality: Left;   TONSILLECTOMY     tonsilletomy     TUBAL LIGATION  04/2007   VAGINAL HYSTERECTOMY Bilateral 10/17/2019   Procedure: HYSTERECTOMY VAGINAL WITH SALPINGECTOMY;  Surgeon: Chancy Milroy, MD;  Location: [place];  Service: Gynecology;  Laterality: Bilateral;   Patient Active Problem List   Diagnosis Date Noted   S/P arthroscopy of right shoulder 07/08/2021   Type 1 superior labral anterior-to-posterior (SLAP) tear of right shoulder 04/24/2021   Arthritis of right acromioclavicular joint 04/24/2021   Chronic left shoulder pain 04/22/2021   Hypertension associated with  diabetes ([place]) 04/22/2021   Hyperlipidemia associated with type 2 diabetes mellitus ([place]) 04/22/2021   Nontraumatic tear of left supraspinatus tendon 02/12/2021   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   Encounter to establish care 09/09/2020   Fatty liver disease, nonalcoholic 25/85/2778   Cervical disc disorder at C5-C6 level with radiculopathy 04/03/2020   Arthritis    Nasal obstruction 02/18/2016   DM2 (diabetes mellitus, type 2) ([place]) 10/21/2015   Vitamin D deficiency 09/25/2015   GASTROESOPHAGEAL REFLUX DISEASE 08/14/2009   Body mass index (BMI)  45.0-49.9, adult ([place]) 05/30/2007   Moderate persistent asthma with acute exacerbation 12/06/2006    PCP: Orma Render, NP  REFERRING PROVIDER: Aundra Dubin, PA-C  REFERRING DIAG: [phone] (ICD-10-CM) - S/P arthroscopy of right shoulder  THERAPY DIAG:  Chronic right shoulder pain  Muscle weakness (generalized)  Abnormal posture  Localized edema   ONSET DATE: 06/25/2021 surgery Use of Spanish Interpreter in person SUBJECTIVE:                                                                                                                                                                                      SUBJECTIVE STATEMENT: Patient indicated Rt shoulder surgery has been better than the previous Lt shoulder.  Patient stated still waking due to symptoms at night.    PERTINENT HISTORY: History of Lt shoulder surgery 02/12/2021, therapy here at clinic HTN, DM, hyperlipidemia, history of carpal tunnel syndrome bilateral  PAIN:  Are you having pain? Yes NPRS scale: current: 2/10  at worst: 4/10 Pain location: shoulder Pain orientation: Right  PAIN TYPE: surgical Pain description: burning Aggravating factors: reaching out, brushing teeth/hair with Rt arm, lifting/carrying Relieving factors: ice  PRECAUTIONS: Shoulder - Acromioplasty (no specific referral limitations noted  WEIGHT BEARING RESTRICTIONS No  FALLS:  Has patient fallen in last 6 months? 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)  PLOF: Independent, Rt hand dominant  PATIENT GOALS Reduce pain  OBJECTIVE:     PATIENT SURVEYS:  07/10/2021 : FOTO intake: 53 predicted:  62  COGNITION:  Overall cognitive status: Within functional limits for tasks assessed     SENSATION:  Light touch: Appears intact  General Localized edema Rt shoulder     UPPER EXTREMITY AROM/PROM:  A/PROM Right 07/10/2021 Left 07/10/2021   Shoulder flexion Seated against gravity AROM 80 degrees with pain  Supine: AROM 110 c pain, passive 115   Shoulder extension    Shoulder abduction Supine passive 90 degrees with pain   Shoulder adduction    Shoulder internal rotation In supine 30 deg abduction  AROM 80   Shoulder external rotation In supine 30 deg abduction AROM 60   Elbow flexion WFL   Elbow extension [facility]   Wrist flexion    Wrist extension    Wrist ulnar deviation    Wrist radial deviation    Wrist pronation    Wrist supination    (Blank rows = not tested)  UPPER EXTREMITY MMT:  MMT Right 07/10/2021 Left 07/10/2021  Shoulder flexion 2/5   Shoulder extension    Shoulder abduction 2/5   Shoulder adduction    Shoulder internal rotation 4/5   Shoulder external rotation 3/5   Middle trapezius    Lower trapezius    Elbow flexion    Elbow extension    Wrist flexion    Wrist extension    Wrist ulnar deviation    Wrist radial deviation    Wrist pronation    Wrist supination    Grip strength (lbs)    (Blank rows = not tested)    JOINT MOBILITY TESTING:  07/10/2021: Minimal restriction noted in all directions in mid range testing Rt shoulder   PALPATION:  07/10/2021: no specific tenderness noted to touch around Rt shoulder   POSTURE:  07/10/2021: Rounded shoulders bilateral   TODAY'S TREATMENT:  07/10/2021: Therapeutic Exercise:  Aerobic: Supine: Lt arm moving Rt into flexion 2-3 second hold x 10 Prone:  Seated: scapular retraction 5 sec hold x 10  Standing:  isometrics at doorway 5 sec hold x 10 Rt shoulder c arm at side 90 degrees elbow flexion.  Performed for flexion, abd, ER, IR each   Additional time in review of techniques and education for use at home for HEP.    Manual Therapy: PROM Rt shoulder, g2 inferior joint mobs for pain relief      PATIENT EDUCATION: 07/10/2021: Education details: HEP, POC Person educated: Patient Education method: Consulting civil engineer, Demonstration, Verbal cues, and  Handouts Education comprehension: verbalized understanding and returned demonstration   HOME EXERCISE PROGRAM: 07/10/2021: Access Code: [facility] URL: [email or web address] Date: 07/10/2021 Prepared by: Scot Jun  Exercises Seated Scapular Retraction - 2-3 x daily - 7 x weekly - 1-2 sets - 10 reps - 5 hold Supine Shoulder Flexion AAROM with Hands Clasped - 2-3 x daily - 7 x weekly - 1-2 sets - 10 reps - 5 hold Standing Isometric Shoulder Internal Rotation at Doorway - 2 x daily - 7 x weekly - 1 sets - 15 reps - 5 hold Standing Isometric Shoulder External Rotation with Doorway (Mirrored) - 2 x daily - 7 x weekly - 1 sets - 15 reps - 5 hold Standing Isometric Shoulder Flexion with Doorway - Arm Bent - 2 x daily - 7 x weekly - 1 sets - 15 reps - 5 hold Standing Isometric Shoulder Abduction with Doorway - Arm Bent (Mirrored) - 2 x daily - 7 x weekly - 1 sets - 15 reps - 5 hold   ASSESSMENT:  CLINICAL IMPRESSION: Patient is a 46  y.o. who comes to clinic with complaints of Rt shoulder pain s/p recent surgery with mobility, strength and movement coordination deficits that impair their ability to perform usual daily and recreational functional activities without increase difficulty/symptoms at this time.  Patient to benefit from skilled PT services to address impairments and limitations to improve to previous level of function without restriction secondary to condition. Objective impairments include decreased activity tolerance, decreased coordination, decreased endurance, decreased mobility, decreased ROM, decreased strength, increased edema, impaired flexibility, impaired UE functional use, improper body  mechanics, postural dysfunction, and pain. These impairments are limiting patient from cleaning, community activity, driving, meal prep, occupation, laundry, and yard work. Personal factors including History of Lt shoulder surgery 02/12/2021, therapy here at clinic HTN, DM,  hyperlipidemia, history of carpal tunnel syndrome bilateral are also affecting patient's functional outcome. Patient will benefit from skilled PT to address above impairments and improve overall function.  REHAB POTENTIAL: Good  CLINICAL DECISION MAKING: Stable/uncomplicated  EVALUATION COMPLEXITY: Low   GOALS: Goals reviewed with patient? Yes  SHORT TERM GOALS:  STG Name Target Date Goal status  1 Patient will demonstrate independent use of home exercise program to maintain progress from in clinic treatments.  07/31/2021 INITIAL                                 LONG TERM GOALS:   LTG Name Target Date Goal status  1 Patient will demonstrate/report pain at worst less than or equal to 2/10 to facilitate minimal limitation in daily activity secondary to pain symptoms.  09/18/2021 INITIAL  2 Patient will demonstrate independent use of home exercise program to facilitate ability to maintain/progress functional gains from skilled physical therapy services.  09/18/2021 INITIAL  3 Patient will demonstrate FOTO outcome > or = 62% to indicated reduced disability due to condition. 09/18/2021 INITIAL  4 Patient will demonstrate Rt  GH joint mobility WFL to facilitate usual self care, dressing, reaching overhead at PLOF s limitation due to symptoms.  09/18/2021 INITIAL  5 Patient will demonstrate Rt UE MMT 5/5 throughout to facilitate usual lifting, carrying in functional activity to PLOF s limitation. 09/18/2021 INITIAL  6 Patient will demonstrate/report ability to return to work at [employer].   09/18/2021 INITIAL        PLAN: PT FREQUENCY: 2x/week  PT DURATION: 10 weeks  PLANNED INTERVENTIONS: Therapeutic exercises, Therapeutic activity, Neuro Muscular re-education, Balance training, Gait training, Patient/Family education, Joint mobilization, Stair training, DME instructions, Dry Needling, Electrical stimulation, Cryotherapy, Moist heat, Taping, Ultrasound, Ionotophoresis 4mg /ml Dexamethasone, and  Manual therapy unless contraindicated  PLAN FOR NEXT SESSION: Review HEP, progressive transition into AAROM/AROM c gravity reduced as tolerated.   Scot Jun, PT, DPT, OCS, ATC 07/10/21  3:48 PM

## 2021-07-10 ENCOUNTER — Encounter: Payer: Self-pay | Admitting: Rehabilitative and Restorative Service Providers"

## 2021-07-10 ENCOUNTER — Ambulatory Visit (INDEPENDENT_AMBULATORY_CARE_PROVIDER_SITE_OTHER): Payer: BC Managed Care – PPO | Admitting: Rehabilitative and Restorative Service Providers"

## 2021-07-10 ENCOUNTER — Other Ambulatory Visit: Payer: Self-pay

## 2021-07-10 DIAGNOSIS — M25511 Pain in right shoulder: Secondary | ICD-10-CM

## 2021-07-10 DIAGNOSIS — R293 Abnormal posture: Secondary | ICD-10-CM | POA: Diagnosis not present

## 2021-07-10 DIAGNOSIS — R600 Localized edema: Secondary | ICD-10-CM

## 2021-07-10 DIAGNOSIS — G8929 Other chronic pain: Secondary | ICD-10-CM

## 2021-07-10 DIAGNOSIS — M6281 Muscle weakness (generalized): Secondary | ICD-10-CM

## 2021-07-12 ENCOUNTER — Encounter (HOSPITAL_COMMUNITY): Payer: Self-pay | Admitting: Emergency Medicine

## 2021-07-12 ENCOUNTER — Ambulatory Visit (HOSPITAL_COMMUNITY)
Admission: EM | Admit: 2021-07-12 | Discharge: 2021-07-12 | Disposition: A | Discharge status: Home / Self Care | Payer: BC Managed Care – PPO | Attending: Emergency Medicine | Admitting: Emergency Medicine

## 2021-07-12 ENCOUNTER — Other Ambulatory Visit: Payer: Self-pay

## 2021-07-12 DIAGNOSIS — J4521 Mild intermittent asthma with (acute) exacerbation: Secondary | ICD-10-CM

## 2021-07-12 MED ORDER — METHYLPREDNISOLONE 4 MG PO TBPK: ORAL_TABLET | ORAL | 0 refills | Status: DC

## 2021-07-12 MED ORDER — METHYLPREDNISOLONE SODIUM SUCC 125 MG IJ SOLR
125.0000 mg | Freq: Once | INTRAMUSCULAR | Status: AC
  Administered 2021-07-12: 125 mg via INTRAMUSCULAR

## 2021-07-12 MED ORDER — METHYLPREDNISOLONE SODIUM SUCC 125 MG IJ SOLR
INTRAMUSCULAR | Status: AC
  Filled 2021-07-12: qty 2

## 2021-07-12 MED ORDER — ALBUTEROL SULFATE HFA 108 (90 BASE) MCG/ACT IN AERS: 2.0000 | INHALATION_SPRAY | Freq: Four times a day (QID) | RESPIRATORY_TRACT | 1 refills | Status: DC | PRN

## 2021-07-12 MED ORDER — AEROCHAMBER PLUS FLO-VU LARGE MISC
1.0000 | Freq: Once | 0 refills | Status: AC
Start: 2021-07-12 — End: 2021-07-12

## 2021-07-12 MED ORDER — FLUTICASONE PROPIONATE 50 MCG/ACT NA SUSP: 2.0000 | Freq: Every day | NASAL | 0 refills | Status: DC

## 2021-07-12 MED ORDER — CETIRIZINE HCL 10 MG PO TABS: 10.0000 mg | ORAL_TABLET | Freq: Every day | ORAL | 2 refills | Status: DC

## 2021-07-12 NOTE — ED Triage Notes (Signed)
Pt having cough, chills, body aches, and headache for several days.

## 2021-07-12 NOTE — Discharge Instructions (Addendum)
Your symptoms and my physical exam findings are concerning for exacerbation of your underlying allergies.  It is important that you are consistent with taking allergy medications exactly as prescribed.  Not doing so increases your risk of more frequent upper respiratory infections that may or may not require the use of antibiotics, serious exacerbations that require the use of oral steroids, loss of time at work and missed social opportunities.   Your symptoms and my physical exam findings are concerning for exacerbation of your underlying asthma.  Is important that you are consistent with taking your asthma medications exactly as prescribed.  Not doing so increases your risk of more frequent upper respiratory infections that may or may not require the use of antibiotics, more serious asthma exacerbations that may require the use of oral steroids, also time at work and missed social opportunities.  You also significantly increase your risk of hospitalization secondary to respiratory infections.                     Please see the list below for recommended medications, dosages and frequencies to provide relief of your current symptoms:     Methylprednisolone IM (Solu-Medrol):  To quickly address your significant respiratory inflammation, you were provided with an injection of methylprednisolone in the office today.  You should continue to feel the full benefit of the steroid for the next 4 to 6 hours.    Methylprednisolone (Medrol Dosepak): This is a steroid that will significantly calm your upper and lower airways, please take one row of tablets daily with your breakfast meal starting tomorrow morning until the prescription is complete.      Albuterol HFA: This is a bronchodilator, it relaxes the smooth muscles that constrict your airway in your lungs when you are feeling sick or having inflammation secondary to allergies or upper respiratory infection.  Please inhale 2 puffs twice daily every day using  the spacer provided.  You can also inhale 2 more puffs as often as needed throughout the day for aggravating cough, chest tightness, feeling short of breath, wheezing.      Fluticasone (Flonase): This is a steroid nasal spray that you use once daily, 1 spray in each nare.  After 3 to 5 days of use, you will have significant improvement of the inflammation and mucus production that is being caused by exposure to allergens.  This medication can be purchased over-the-counter however I have provided you with a prescription.      Cetirizine (Zyrtec): This is an excellent second-generation antihistamine that helps to reduce respiratory inflammatory response to viruses and environmental allergens.  Please take 1 tablet daily at bedtime.   Please follow-up within the next 7 to 10 days either with your primary care provider or urgent care if your symptoms do not resolve.  If you do not have a primary care provider, we will assist you in finding one.

## 2021-07-12 NOTE — ED Provider Notes (Signed)
[place]    CSN: [id number] Arrival date & time: 07/12/21  1004    HISTORY   Chief Complaint  Patient presents with   Cough   HPI Alexandra Norris is a 46 y.o. female. Patient is here with a friend who provides interpretation to her for today.  Patient complains of cough, congestion, chills, body aches and headache for several days.  Patient reports a history of allergies and asthma, states not currently using any medications for these issues.  Patient denies nausea, vomiting, diarrhea.  Patient denies known sick contacts.  Patient states she overall is feeling better today however states the cough has gotten a little bit worse.  The history is provided by the patient. A language interpreter was used.  Past Medical History:  Diagnosis Date   Anemia    Anemia    Anxiety    Arthritis    Asthma    Bilateral chronic knee pain 09/25/2015   Carpal tunnel syndrome    Depression    DEPRESSION, SITUATIONAL, PROLONGED 02/05/2009   Qualifier: Diagnosis of  By: Sherilyn Cooter  MD, Khary     Diabetes mellitus without complication ([place])    Dizziness 04/22/2021   Dyspnea    Elevated blood-pressure reading, without diagnosis of hypertension 06/17/2020   Endometriosis    Fatigue 04/22/2021   History of blood transfusion 1999   w/vaginal delivery   History of bronchitis    "I've stayed in hospital 2 X w/bronchitis"   Hyperlipidemia    Hypertension    Impingement syndrome of left shoulder    Influenza vaccine needed 04/03/2020   Left carpal tunnel syndrome 07/11/2019   Migraine    Neck pain 10/14/2020   Pneumonia    PONV (postoperative nausea and vomiting)    Pre-diabetes 10/03/2019   A1C 6.4   Right carpal tunnel syndrome 07/11/2019   S/P carpal tunnel release 08/30/2019   Seasonal allergies 10/08/2016   Shingles 11/14/2020   Patient Active Problem List   Diagnosis Date Noted   S/P arthroscopy of right shoulder 07/08/2021   Type 1 superior labral  anterior-to-posterior (SLAP) tear of right shoulder 04/24/2021   Arthritis of right acromioclavicular joint 04/24/2021   Chronic left shoulder pain 04/22/2021   Hypertension associated with diabetes ([place]) 04/22/2021   Hyperlipidemia associated with type 2 diabetes mellitus ([place]) 04/22/2021   Nontraumatic tear of left supraspinatus tendon 02/12/2021   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   Encounter to establish care 09/09/2020   Fatty liver disease, nonalcoholic 66/11/3014   Cervical disc disorder at C5-C6 level with radiculopathy 04/03/2020   Arthritis    Nasal obstruction 02/18/2016   DM2 (diabetes mellitus, type 2) ([place]) 10/21/2015   Vitamin D deficiency 09/25/2015   GASTROESOPHAGEAL REFLUX DISEASE 08/14/2009   Body mass index (BMI) 45.0-49.9, adult ([place]) 05/30/2007   Moderate persistent asthma with acute exacerbation 12/06/2006   Past Surgical History:  Procedure Laterality Date   ABDOMINAL HYSTERECTOMY  10/17/2019   APPENDECTOMY     CARPAL TUNNEL RELEASE Right 07/19/2019   Procedure: RIGHT CARPAL TUNNEL RELEASE;  Surgeon: Leandrew Koyanagi, MD;  Location: [place];  Service: Orthopedics;  Laterality: Right;   CARPAL TUNNEL RELEASE Left 09/13/2019   Procedure: LEFT CARPAL TUNNEL RELEASE;  Surgeon: Leandrew Koyanagi, MD;  Location: [place];  Service: Orthopedics;  Laterality: Left;  Bier block   CESAREAN SECTION  1997; 2008   CHOLECYSTECTOMY  2011   SHOULDER ARTHROSCOPY WITH DISTAL CLAVICLE RESECTION  02/12/2021  Procedure: SHOULDER ARTHROSCOPY WITH DISTAL CLAVICLE RESECTION;  Surgeon: Leandrew Koyanagi, MD;  Location: [place];  Service: Orthopedics;;   SHOULDER ARTHROSCOPY WITH ROTATOR CUFF REPAIR AND SUBACROMIAL DECOMPRESSION Left 02/12/2021   Procedure: LEFT SHOULDER ARTHROSCOPY WITH ROTATOR CUFF REPAIR, SUBACROMIAL DECOMPRESSION, EXTENSIVE DEBRIDEMENT;  Surgeon: Leandrew Koyanagi, MD;  Location: [place];  Service: Orthopedics;   Laterality: Left;   TONSILLECTOMY     tonsilletomy     TUBAL LIGATION  04/2007   VAGINAL HYSTERECTOMY Bilateral 10/17/2019   Procedure: HYSTERECTOMY VAGINAL WITH SALPINGECTOMY;  Surgeon: Chancy Milroy, MD;  Location: [place];  Service: Gynecology;  Laterality: Bilateral;   OB History     Gravida  3   Para  3   Term  3   Preterm      AB      Living  3      SAB      IAB      Ectopic      Multiple      Live Births             Home Medications    Prior to Admission medications   Medication Sig Start Date End Date Taking? Authorizing Provider  albuterol (VENTOLIN HFA) 108 (90 Base) MCG/ACT inhaler Inhale 2 puffs into the lungs in the morning and at bedtime. 09/09/20   Orma Render, NP  atorvastatin (LIPITOR) 20 MG tablet Take 1 tablet (20 mg total) by mouth at bedtime. 05/20/21   Orma Render, NP  Blood Glucose Monitoring Suppl (CONTOUR NEXT MONITOR) w/Device KIT USE SEGUN LO INDICADO 3 VECES AL DIA ANTES DE LAS COMIDAS 08/05/20 08/05/21  Charlott Rakes, MD  COVID-19 mRNA bivalent vaccine, Pfizer, (PFIZER COVID-19 VAC BIVALENT) injection Inject into the muscle. 04/22/21   Carlyle Basques, MD  diazepam (VALIUM) 5 MG tablet Take 1 tablet (5 mg total) by mouth every 12 (twelve) hours as needed for muscle spasms (shoulder pain). 07/04/21   Antonietta Breach, PA-C  Dulaglutide (TRULICITY) 1.5 TG/6.2IR SOPN Inject 1.5 mg into the skin once a week. 05/20/21   Early, Coralee Pesa, NP  glucose blood test strip USE SEGUN LO INDICADO 3 VECES AL DIA ANTES DE LAS COMIDAS Patient taking differently: USE SEGUN LO INDICADO 3 VECES AL DIA ANTES DE LAS COMIDAS 08/05/20 08/05/21  Charlott Rakes, MD  lisinopril (ZESTRIL) 10 MG tablet Take 1 tablet (10 mg total) by mouth daily. 04/22/21   Orma Render, NP  metFORMIN (GLUCOPHAGE) 500 MG tablet Take 1 tablet (500 mg total) by mouth daily with breakfast AND 2 tablets (1,000 mg total) with dinner 05/20/21   Early, Coralee Pesa, NP  Microlet Lancets MISC USE TO  CHECK BLOOD SUGAR 3 VECES AL DIA 08/06/20 08/06/21  Charlott Rakes, MD  Multiple Vitamin (MULTIVITAMIN WITH MINERALS) TABS tablet Take 1 tablet by mouth daily in the afternoon.    [provider]  naproxen (NAPROSYN) 500 MG tablet Take 1 tablet (500 mg total) by mouth 2 (two) times daily with a meal. 07/04/21   Antonietta Breach, PA-C  ondansetron (ZOFRAN) 4 MG tablet Take 1 tablet (4 mg total) by mouth every 8 (eight) hours as needed for nausea or vomiting. 02/19/21   Aundra Dubin, PA-C  oxyCODONE-acetaminophen (PERCOCET) 5-325 MG tablet Take 1-2 tablets by mouth daily as needed for severe pain. 07/08/21   Leandrew Koyanagi, MD  TRUEplus Lancets 28G MISC USE SEGUN LO INDICADO 3 VECES AL DIA ANTES DE LAS  COMIDAS 08/05/20 08/05/21  Charlott Rakes, MD   Family History Family History  Problem Relation Age of Onset   Diabetes Mother    Osteoarthritis Mother    Hypertension Mother    Hyperlipidemia Mother    Arthritis Mother    Social History Social History   Tobacco Use   Smoking status: Never   Smokeless tobacco: Never  Vaping Use   Vaping Use: Never used  Substance Use Topics   Alcohol use: No   Drug use: No   Allergies   Patient has no known allergies.  Review of Systems Review of Systems Pertinent findings noted in history of present illness.   Physical Exam Triage Vital Signs ED Triage Vitals  Enc Vitals Group     BP 04/04/21 0827 (!) 147/82     Pulse Rate 04/04/21 0827 72     Resp 04/04/21 0827 18     Temp 04/04/21 0827 98.3 F (36.8 C)     Temp Source 04/04/21 0827 Oral     SpO2 04/04/21 0827 98 %     Weight --      Height --      Head Circumference --      Peak Flow --      Pain Score 04/04/21 0826 5     Pain Loc --      Pain Edu? --      Excl. in [place]? --   No data found.  Updated Vital Signs BP 102/71 (BP Location: Left Arm)    Pulse 95    Temp 98.4 F (36.9 C) (Oral)    Resp 20    LMP 09/20/2019 (Exact Date)    SpO2 98%   Physical Exam Vitals and nursing  note reviewed.  Constitutional:      General: She is not in acute distress.    Appearance: Normal appearance. She is not ill-appearing.  HENT:     Head: Normocephalic and atraumatic.     Salivary Glands: Right salivary gland is not diffusely enlarged or tender. Left salivary gland is not diffusely enlarged or tender.     Right Ear: Hearing, ear canal and external ear normal. No drainage. A middle ear effusion is present. There is no impacted cerumen. Tympanic membrane is bulging. Tympanic membrane is not injected or erythematous.     Left Ear: Hearing, ear canal and external ear normal. No drainage. A middle ear effusion is present. There is no impacted cerumen. Tympanic membrane is bulging. Tympanic membrane is not injected or erythematous.     Ears:     Comments: Bilateral EACs normal, both TMs bulging with clear fluid    Nose: Rhinorrhea present. No nasal deformity, septal deviation, signs of injury, nasal tenderness, mucosal edema or congestion. Rhinorrhea is clear.     Right Nostril: Occlusion present. No foreign body, epistaxis or septal hematoma.     Left Nostril: Occlusion present. No foreign body, epistaxis or septal hematoma.     Right Turbinates: Enlarged, swollen and pale.     Left Turbinates: Enlarged, swollen and pale.     Right Sinus: No maxillary sinus tenderness or frontal sinus tenderness.     Left Sinus: No maxillary sinus tenderness or frontal sinus tenderness.     Mouth/Throat:     Lips: Pink. No lesions.     Mouth: Mucous membranes are moist. No oral lesions.     Pharynx: Oropharynx is clear. Uvula midline. No posterior oropharyngeal erythema or uvula swelling.     Tonsils: No  tonsillar exudate. 0 on the right. 0 on the left.     Comments: Postnasal drip Eyes:     General: Lids are normal.        Right eye: No discharge.        Left eye: No discharge.     Extraocular Movements: Extraocular movements intact.     Conjunctiva/sclera: Conjunctivae normal.     Right  eye: Right conjunctiva is not injected.     Left eye: Left conjunctiva is not injected.  Neck:     Trachea: Trachea and phonation normal. No tracheal tenderness.  Cardiovascular:     Rate and Rhythm: Normal rate and regular rhythm.     Pulses: Normal pulses.     Heart sounds: Normal heart sounds. No murmur heard.   No friction rub. No gallop.  Pulmonary:     Effort: Pulmonary effort is normal. No accessory muscle usage, prolonged expiration or respiratory distress.     Breath sounds: No stridor, decreased air movement or transmitted upper airway sounds. Examination of the right-upper field reveals wheezing. Examination of the left-upper field reveals wheezing. Examination of the right-middle field reveals wheezing. Examination of the left-middle field reveals wheezing. Wheezing present. No decreased breath sounds, rhonchi or rales.  Chest:     Chest wall: No tenderness.  Abdominal:     General: Abdomen is flat. Bowel sounds are normal.     Palpations: Abdomen is soft.     Tenderness: There is no abdominal tenderness.     Hernia: No hernia is present.  Musculoskeletal:        General: Normal range of motion.     Cervical back: Normal range of motion and neck supple. No edema, erythema, rigidity or crepitus. No pain with movement. Normal range of motion.     Right lower leg: No edema.     Left lower leg: No edema.  Lymphadenopathy:     Cervical: No cervical adenopathy.  Skin:    General: Skin is warm and dry.     Findings: No erythema or rash.     Comments: Skin is warm and dry to touch, good turgor, no rash appreciated  Neurological:     General: No focal deficit present.     Mental Status: She is alert and oriented to person, place, and time.     Motor: Motor function is intact.     Coordination: Coordination is intact.     Gait: Gait is intact.     Deep Tendon Reflexes:     Reflex Scores:      Patellar reflexes are 2+ on the right side and 2+ on the left side. Psychiatric:         Attention and Perception: Attention and perception normal.        Mood and Affect: Mood normal.        Speech: Speech normal.        Behavior: Behavior normal. Behavior is cooperative.        Thought Content: Thought content normal.        Cognition and Memory: Cognition normal.        Judgment: Judgment normal.    Visual Acuity Right Eye Distance:   Left Eye Distance:   Bilateral Distance:    Right Eye Near:   Left Eye Near:    Bilateral Near:     UC Couse / Diagnostics / Procedures:    EKG  Radiology No results found.  Procedures Procedures (including critical care time)  UC Diagnoses / Final Clinical Impressions(s)   I have reviewed the triage vital signs and the nursing notes.  Pertinent labs & imaging results that were available during my care of the patient were reviewed by me and considered in my medical decision making (see chart for details).   Final diagnoses:  Mild intermittent asthma with exacerbation   Patient educated regarding asthma management.  I have renewed her prescription for albuterol and provided her with a spacer.  Of also encouraged her to use Flonase, Zyrtec and to begin a Medrol Dosepak to see address her significant wheezing on exam.  ED Prescriptions     Medication Sig Dispense Auth. Provider   albuterol (VENTOLIN HFA) 108 (90 Base) MCG/ACT inhaler Inhale 2 puffs into the lungs every 6 (six) hours as needed for wheezing or shortness of breath (Cough). 18 g Lynden Oxford Scales, PA-C   Spacer/Aero-Holding Chambers (AEROCHAMBER PLUS FLO-VU LARGE) MISC 1 each by Other route once for 1 dose. 1 each Lynden Oxford Scales, PA-C   methylPREDNISolone (MEDROL DOSEPAK) 4 MG TBPK tablet Take 24 mg on day 1, 20 mg on day 2, 16 mg on day 3, 12 mg on day 4, 8 mg on day 5, 4 mg on day 6. 21 tablet Lynden Oxford Scales, PA-C   cetirizine (ZYRTEC ALLERGY) 10 MG tablet Take 1 tablet (10 mg total) by mouth at bedtime. 30 tablet Lynden Oxford Scales,  PA-C   fluticasone (FLONASE) 50 MCG/ACT nasal spray Place 2 sprays into both nostrils daily. 18 mL Lynden Oxford Scales, PA-C      PDMP not reviewed this encounter.  Pending results:  Labs Reviewed - No data to display  Medications Ordered in UC: Medications  methylPREDNISolone sodium succinate (SOLU-MEDROL) 125 mg/2 mL injection 125 mg (125 mg Intramuscular Given 07/12/21 1047)    Disposition Upon Discharge:  Condition: stable for discharge home Home: take medications as prescribed; routine discharge instructions as discussed; follow up as advised.  Patient presented with an acute illness with associated systemic symptoms and significant discomfort requiring urgent management. In my opinion, this is a condition that a prudent lay person (someone who possesses an average knowledge of health and medicine) may potentially expect to result in complications if not addressed urgently such as respiratory distress, impairment of bodily function or dysfunction of bodily organs.   Routine symptom specific, illness specific and/or disease specific instructions were discussed with the patient and/or caregiver at length.   As such, the patient has been evaluated and assessed, work-up was performed and treatment was provided in alignment with urgent care protocols and evidence based medicine.  Patient/parent/caregiver has been advised that the patient may require follow up for further testing and treatment if the symptoms continue in spite of treatment, as clinically indicated and appropriate.  If the patient was tested for COVID-19, Influenza and/or RSV, then the patient/parent/guardian was advised to isolate at home pending the results of his/her diagnostic coronavirus test and potentially longer if theyre positive. I have also advised pt that if his/her COVID-19 test returns positive, it's recommended to self-isolate for at least 10 days after symptoms first appeared AND until fever-free for 24 hours  without fever reducer AND other symptoms have improved or resolved. Discussed self-isolation recommendations as well as instructions for household member/close contacts as per the [employer] [facility] and [place] DHHS, and also gave patient the [place] packet with this information.  Patient/parent/caregiver has been advised to return to the [facility] or PCP in 3-5 days if no better; to  PCP or the Emergency Department if new signs and symptoms develop, or if the current signs or symptoms continue to change or worsen for further workup, evaluation and treatment as clinically indicated and appropriate  The patient will follow up with their current PCP if and as advised. If the patient does not currently have a PCP we will assist them in obtaining one.   The patient may need specialty follow up if the symptoms continue, in spite of conservative treatment and management, for further workup, evaluation, consultation and treatment as clinically indicated and appropriate.  Patient/parent/caregiver verbalized understanding and agreement of plan as discussed.  All questions were addressed during visit.  Please see discharge instructions below for further details of plan.  Discharge Instructions:   Discharge Instructions      Your symptoms and my physical exam findings are concerning for exacerbation of your underlying allergies.  It is important that you are consistent with taking allergy medications exactly as prescribed.  Not doing so increases your risk of more frequent upper respiratory infections that may or may not require the use of antibiotics, serious exacerbations that require the use of oral steroids, loss of time at work and missed social opportunities.   Your symptoms and my physical exam findings are concerning for exacerbation of your underlying asthma.  Is important that you are consistent with taking your asthma medications exactly as prescribed.  Not doing so increases your risk of more frequent upper respiratory  infections that may or may not require the use of antibiotics, more serious asthma exacerbations that may require the use of oral steroids, also time at work and missed social opportunities.  You also significantly increase your risk of hospitalization secondary to respiratory infections.                     Please see the list below for recommended medications, dosages and frequencies to provide relief of your current symptoms:     Methylprednisolone IM (Solu-Medrol):  To quickly address your significant respiratory inflammation, you were provided with an injection of methylprednisolone in the office today.  You should continue to feel the full benefit of the steroid for the next 4 to 6 hours.    Methylprednisolone (Medrol Dosepak): This is a steroid that will significantly calm your upper and lower airways, please take one row of tablets daily with your breakfast meal starting tomorrow morning until the prescription is complete.      Albuterol HFA: This is a bronchodilator, it relaxes the smooth muscles that constrict your airway in your lungs when you are feeling sick or having inflammation secondary to allergies or upper respiratory infection.  Please inhale 2 puffs twice daily every day using the spacer provided.  You can also inhale 2 more puffs as often as needed throughout the day for aggravating cough, chest tightness, feeling short of breath, wheezing.      Fluticasone (Flonase): This is a steroid nasal spray that you use once daily, 1 spray in each nare.  After 3 to 5 days of use, you will have significant improvement of the inflammation and mucus production that is being caused by exposure to allergens.  This medication can be purchased over-the-counter however I have provided you with a prescription.      Cetirizine (Zyrtec): This is an excellent second-generation antihistamine that helps to reduce respiratory inflammatory response to viruses and environmental allergens.  Please take 1  tablet daily at bedtime.   Please follow-up within the next  7 to 10 days either with your primary care provider or urgent care if your symptoms do not resolve.  If you do not have a primary care provider, we will assist you in finding one.       This office note has been dictated using Museum/gallery curator.  Unfortunately, and despite my best efforts, this method of dictation can sometimes lead to occasional typographical or grammatical errors.  I apologize in advance if this occurs.     Lynden Oxford Scales, PA-C 07/12/21 [phone]

## 2021-07-15 ENCOUNTER — Encounter (HOSPITAL_BASED_OUTPATIENT_CLINIC_OR_DEPARTMENT_OTHER): Payer: Self-pay | Admitting: Family Medicine

## 2021-07-15 ENCOUNTER — Other Ambulatory Visit: Payer: Self-pay

## 2021-07-15 ENCOUNTER — Ambulatory Visit (HOSPITAL_BASED_OUTPATIENT_CLINIC_OR_DEPARTMENT_OTHER): Payer: BC Managed Care – PPO | Admitting: Family Medicine

## 2021-07-15 ENCOUNTER — Ambulatory Visit
Admission: RE | Admit: 2021-07-15 | Discharge: 2021-07-15 | Disposition: A | Discharge status: Home / Self Care | Payer: BC Managed Care – PPO | Source: Ambulatory Visit | Attending: Family Medicine | Admitting: Family Medicine

## 2021-07-15 DIAGNOSIS — R051 Acute cough: Secondary | ICD-10-CM

## 2021-07-15 DIAGNOSIS — R059 Cough, unspecified: Secondary | ICD-10-CM | POA: Insufficient documentation

## 2021-07-15 IMAGING — DX DG CHEST 2V
2 series · 2 of 2 positions shown · non-contrast
Comparison: Chest radiograph [DATE]

CLINICAL DATA: Cough and congestion.

EXAM:
CHEST - 2 VIEW

[dg chest 2 view (1 of 2)]
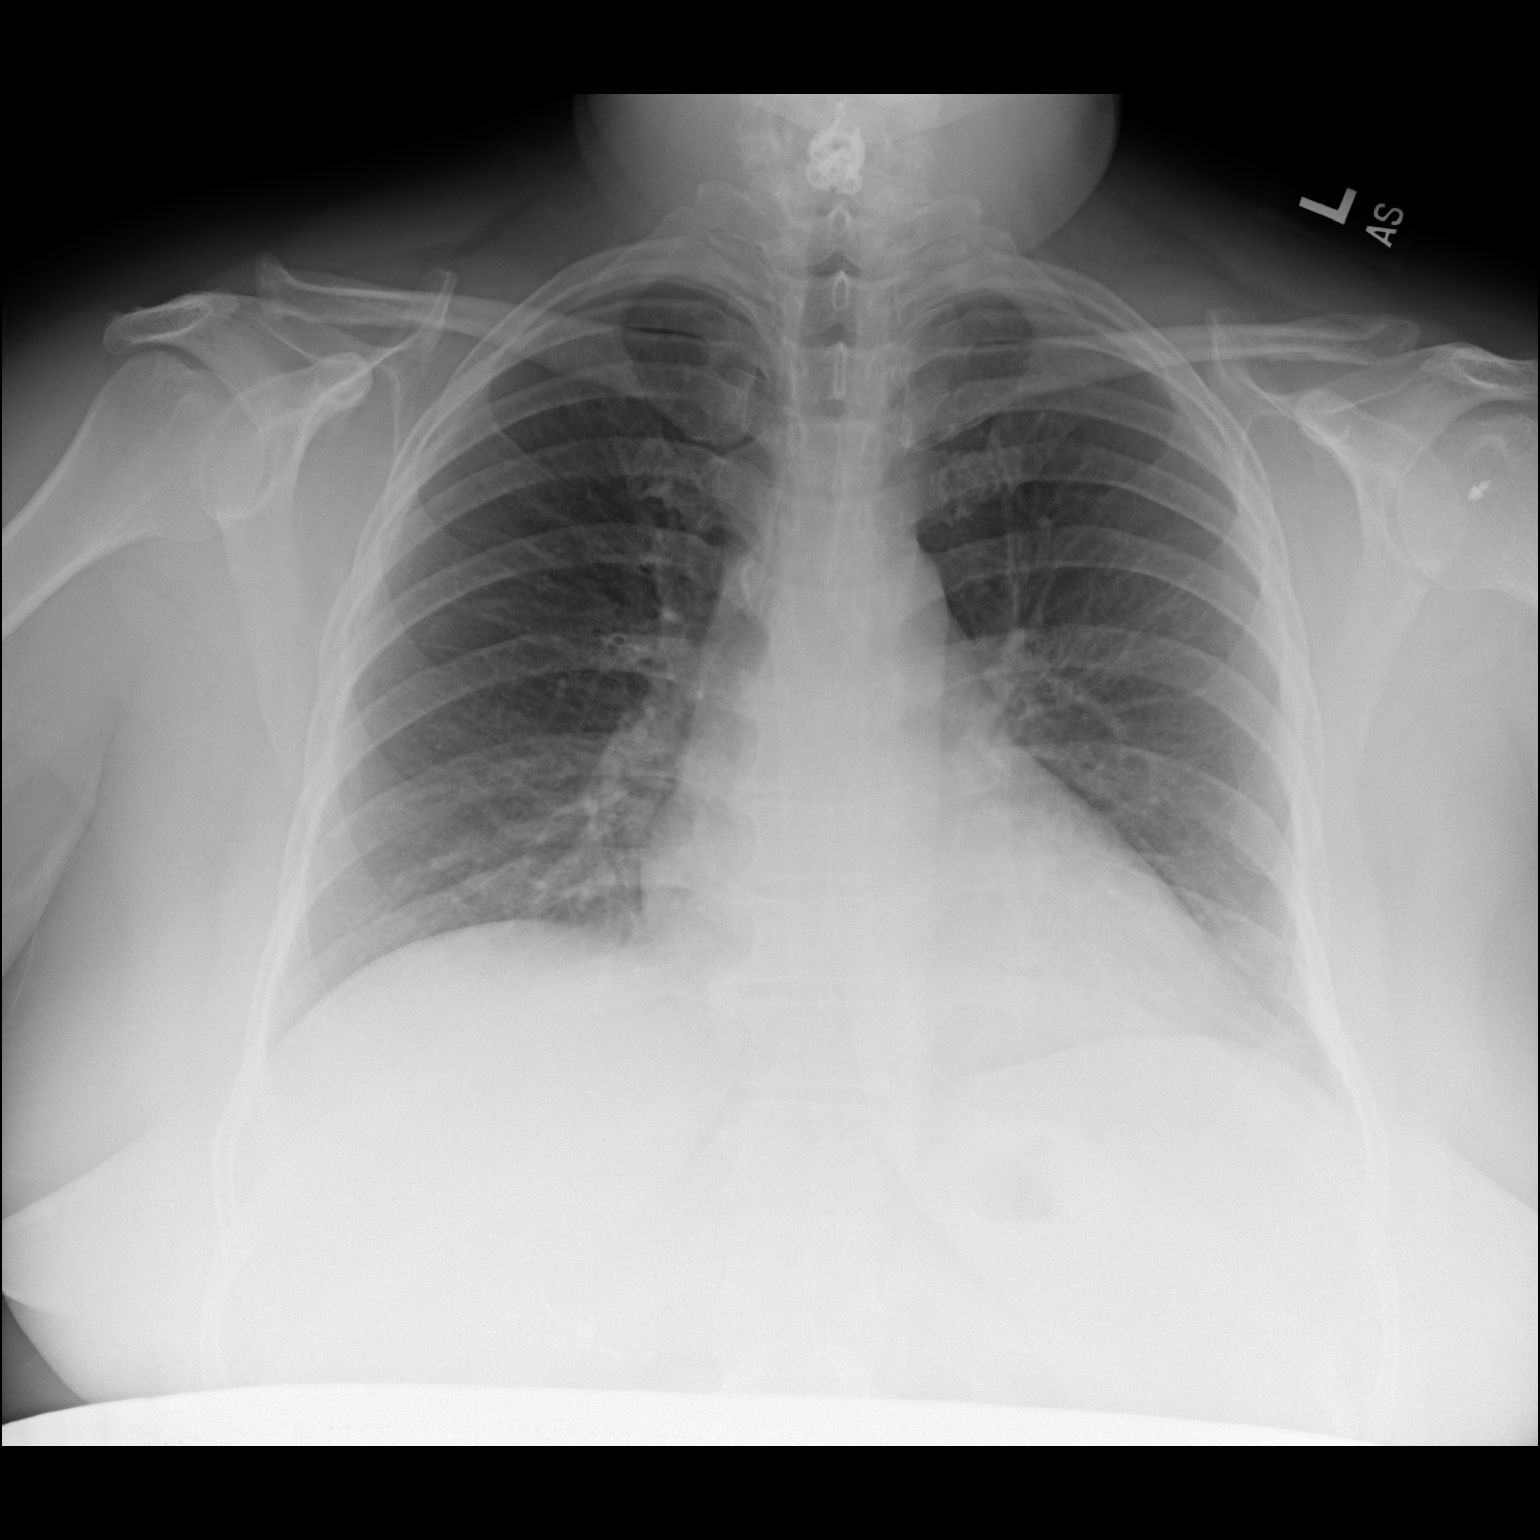

[dg chest 2 view (2 of 2)]
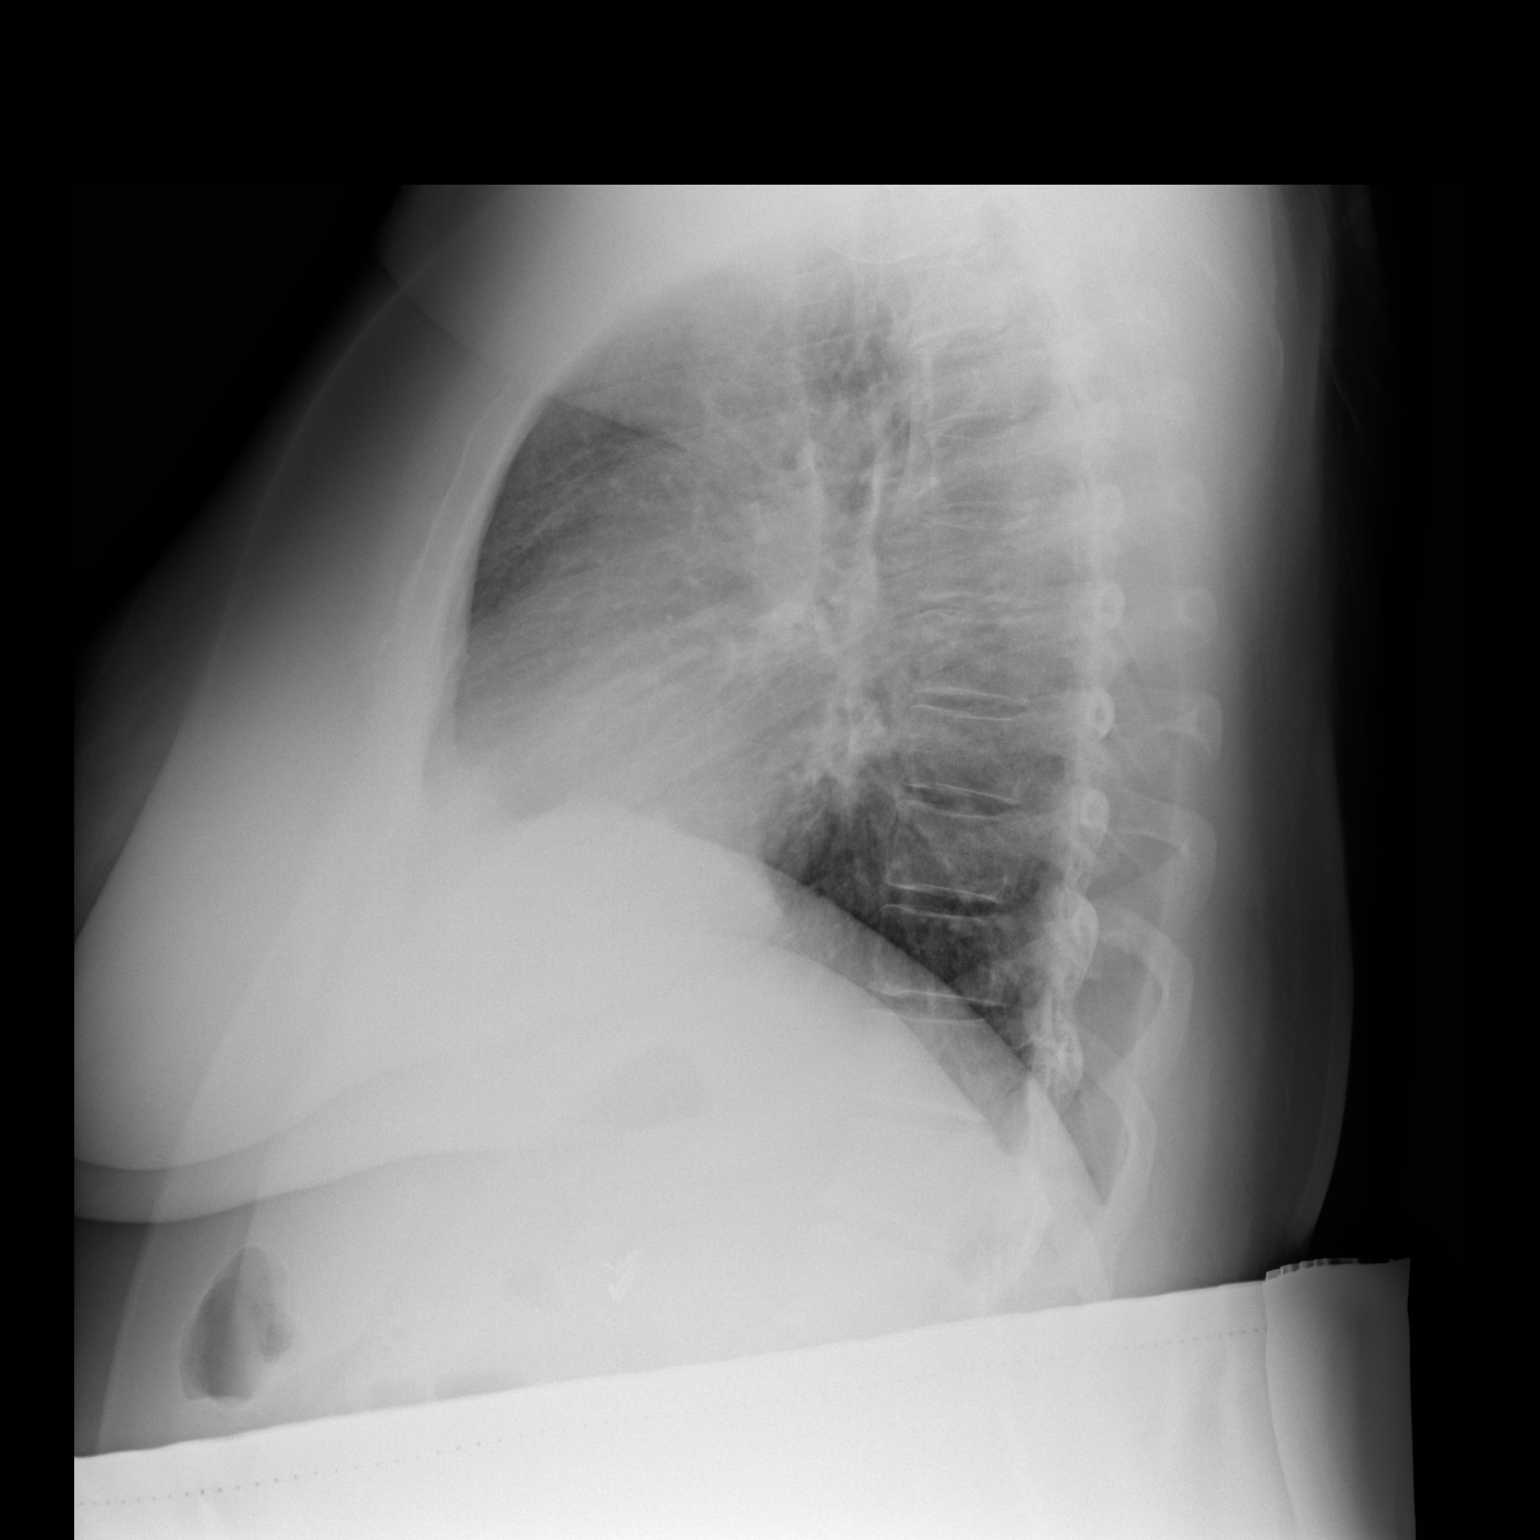

[2 of 2 positions shown; findings below may reference images not displayed]

FINDINGS: Cardiac contours upper limits of normal. No large area pulmonary
consolidation. No pleural effusion or pneumothorax.
IMPRESSION: No active cardiopulmonary disease.

## 2021-07-15 NOTE — Progress Notes (Signed)
° ° °  Procedures performed today:    None.  Independent interpretation of notes and tests performed by another provider:   None.  Brief History, Exam, Impression, and Recommendations:    BP 108/84    Pulse 88    Ht 5\' 1"  (1.549 m)    Wt 234 lb (106.1 kg)    LMP 09/20/2019 (Exact Date)    SpO2 98%    BMI 44.21 kg/m   Cough Has been feeling sick for about 2 weeks - started with cough, headache, bodyache At present, has been having cough, chest congestion, SOB - particularly at night. SOB tends to be with activity, when talking Reports having fever and chills at night - not checking temp, has subjective fever Was seen at UC 3 days ago - prescribed albuterol inhaler, Medrol DosePak. Did not have any testing at [facility] - did not test for flu, no CXR. She has also been taking NyQuil, Robitussin, cough drops Patient with history of asthma, diabetes. Denies any sick contacts. Has not completed any home COVID testing On exam, mild pharyngeal erythema without exudate.  Mild cervical lymphadenopathy.  Lungs with intermittent wheezes, crackles.  Heart with regular rate and rhythm. Proceed with CXR to evaluate further Can continue with medications prescribed from UC Can also continue with Flonase and can also use nasal saline spray 2-3 times daily Recommend continuing with OTC measures to help with symptoms, consider Mucinex, ensure adequate hydration with this Consider performing at home COVID test, she will let us know result if she tests. Plan to proceed with antibiotic therapy once CXR results obtained Denies any allergies to antibiotics   ___________________________________________ Emmett Bracknell de [place], MD, ABFM, CAQSM [facility] [place]

## 2021-07-15 NOTE — Assessment & Plan Note (Signed)
Has been feeling sick for about 2 weeks - started with cough, headache, bodyache At present, has been having cough, chest congestion, SOB - particularly at night. SOB tends to be with activity, when talking Reports having fever and chills at night - not checking temp, has subjective fever Was seen at UC 3 days ago - prescribed albuterol inhaler, Medrol DosePak. Did not have any testing at [facility] - did not test for flu, no CXR. She has also been taking NyQuil, Robitussin, cough drops Patient with history of asthma, diabetes. Denies any sick contacts. Has not completed any home COVID testing On exam, mild pharyngeal erythema without exudate.  Mild cervical lymphadenopathy.  Lungs with intermittent wheezes, crackles.  Heart with regular rate and rhythm. Proceed with CXR to evaluate further Can continue with medications prescribed from UC Can also continue with Flonase and can also use nasal saline spray 2-3 times daily Recommend continuing with OTC measures to help with symptoms, consider Mucinex, ensure adequate hydration with this Consider performing at home COVID test, she will let us know result if she tests. Plan to proceed with antibiotic therapy once CXR results obtained Denies any allergies to antibiotics

## 2021-07-15 NOTE — Patient Instructions (Signed)
°  Medication Instructions:  Your physician recommends that you continue on your current medications as directed. Please refer to the Current Medication list given to you today. --If you need a refill on any your medications before your next appointment, please call your pharmacy first. If no refills are authorized on file call the office.-- Referrals/Procedures/Imaging: Your physician recommends that you have an XRAY of your Chest. X-rays are a type of radiation called electromagnetic waves. X-ray imaging creates pictures of the inside of your body. The images show the parts of your body in different shades of black and white. This is because different tissues absorb different amounts of radiation. Calcium in bones absorbs x-rays the most, so bones look white. Fat and other soft tissues absorb less and look gray. Air absorbs the least, so lungs look black     1. You may have this done at the [facility], located in the [place] on the 1st floor.    2. You do no have to have an appointment.    3. [place],  [id number]        [phone]        Monday - Friday  8:00 am - 5:00 pm 4. [facility] at [facility]. [facility] at [facility] is a premier diagnostic imaging center that offers a 64-slice CT scanner and interventional radiology services.   Follow-Up: Your next appointment:   Your physician recommends that you schedule a follow-up appointment in: AS NEEDED with Dr. de [place]  You will receive a text message or e-mail with a link to a survey about your care and experience with [place] today! We would greatly appreciate your feedback!   Thanks for letting us be apart of your health journey!!  Primary Care and Sports Medicine   Dr. Arlina Robes [place]   We encourage you to activate your patient portal called "MyChart".  Sign up information is provided on this After Visit  Summary.  MyChart is used to connect with patients for Virtual Visits (Telemedicine).  Patients are able to view lab/test results, encounter notes, upcoming appointments, etc.  Non-urgent messages can be sent to your provider as well. To learn more about what you can do with MyChart, please visit --  [email or web address].

## 2021-07-16 ENCOUNTER — Encounter: Payer: BC Managed Care – PPO | Admitting: Physical Therapy

## 2021-07-17 ENCOUNTER — Encounter: Payer: BC Managed Care – PPO | Admitting: Physical Therapy

## 2021-07-17 NOTE — Therapy (Incomplete)
OUTPATIENT PHYSICAL THERAPY TREATMENT NOTE   Patient Name: Alexandra Norris MRN: [id number] DOB:[birth year], 46 y.o., female Today's Date: 07/17/2021  PCP: Orma Render, NP REFERRING PROVIDER: Aundra Dubin, PA-C    Past Medical History:  Diagnosis Date   Anemia    Anemia    Anxiety    Arthritis    Asthma    Bilateral chronic knee pain 09/25/2015   Carpal tunnel syndrome    Depression    DEPRESSION, SITUATIONAL, PROLONGED 02/05/2009   Qualifier: Diagnosis of  By: Sherilyn Cooter  MD, Khary     Diabetes mellitus without complication ([place])    Dizziness 04/22/2021   Dyspnea    Elevated blood-pressure reading, without diagnosis of hypertension 06/17/2020   Endometriosis    Fatigue 04/22/2021   History of blood transfusion 1999   w/vaginal delivery   History of bronchitis    "I've stayed in hospital 2 X w/bronchitis"   Hyperlipidemia    Hypertension    Impingement syndrome of left shoulder    Influenza vaccine needed 04/03/2020   Left carpal tunnel syndrome 07/11/2019   Migraine    Neck pain 10/14/2020   Pneumonia    PONV (postoperative nausea and vomiting)    Pre-diabetes 10/03/2019   A1C 6.4   Right carpal tunnel syndrome 07/11/2019   S/P carpal tunnel release 08/30/2019   Seasonal allergies 10/08/2016   Shingles 11/14/2020   Past Surgical History:  Procedure Laterality Date   ABDOMINAL HYSTERECTOMY  10/17/2019   APPENDECTOMY     CARPAL TUNNEL RELEASE Right 07/19/2019   Procedure: RIGHT CARPAL TUNNEL RELEASE;  Surgeon: Leandrew Koyanagi, MD;  Location: [place];  Service: Orthopedics;  Laterality: Right;   CARPAL TUNNEL RELEASE Left 09/13/2019   Procedure: LEFT CARPAL TUNNEL RELEASE;  Surgeon: Leandrew Koyanagi, MD;  Location: [place];  Service: Orthopedics;  Laterality: Left;  Bier block   [place]; 2008   CHOLECYSTECTOMY  2011   SHOULDER ARTHROSCOPY WITH DISTAL CLAVICLE RESECTION  02/12/2021   Procedure: SHOULDER  ARTHROSCOPY WITH DISTAL CLAVICLE RESECTION;  Surgeon: Leandrew Koyanagi, MD;  Location: [place];  Service: Orthopedics;;   SHOULDER ARTHROSCOPY WITH ROTATOR CUFF REPAIR AND SUBACROMIAL DECOMPRESSION Left 02/12/2021   Procedure: LEFT SHOULDER ARTHROSCOPY WITH ROTATOR CUFF REPAIR, SUBACROMIAL DECOMPRESSION, EXTENSIVE DEBRIDEMENT;  Surgeon: Leandrew Koyanagi, MD;  Location: [place];  Service: Orthopedics;  Laterality: Left;   TONSILLECTOMY     tonsilletomy     TUBAL LIGATION  04/2007   VAGINAL HYSTERECTOMY Bilateral 10/17/2019   Procedure: HYSTERECTOMY VAGINAL WITH SALPINGECTOMY;  Surgeon: Chancy Milroy, MD;  Location: [place];  Service: Gynecology;  Laterality: Bilateral;   Patient Active Problem List   Diagnosis Date Noted   Cough 07/15/2021   S/P arthroscopy of right shoulder 07/08/2021   Type 1 superior labral anterior-to-posterior (SLAP) tear of right shoulder 04/24/2021   Arthritis of right acromioclavicular joint 04/24/2021   Chronic left shoulder pain 04/22/2021   Hypertension associated with diabetes ([place]) 04/22/2021   Hyperlipidemia associated with type 2 diabetes mellitus ([place]) 04/22/2021   Nontraumatic tear of left supraspinatus tendon 02/12/2021   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   Encounter to establish care 09/09/2020   Fatty liver disease, nonalcoholic 18/29/9371   Cervical disc disorder at C5-C6 level with radiculopathy 04/03/2020   Arthritis    Nasal obstruction 02/18/2016   DM2 (diabetes mellitus, type 2) ([place]) 10/21/2015   Vitamin D deficiency 09/25/2015   GASTROESOPHAGEAL REFLUX  DISEASE 08/14/2009   Body mass index (BMI) 45.0-49.9, adult (HCC) 05/30/2007   Moderate persistent asthma with acute exacerbation 12/06/2006    REFERRING DIAG: T70.017 (ICD-10-CM) - S/P arthroscopy of right shoulder  ONSET DATE: 06/25/2021 surgery  THERAPY DIAG:  No diagnosis found.  PERTINENT HISTORY: History of Lt shoulder surgery 02/12/2021, therapy  here at clinic, HTN, DM, hyperlipidemia, history of carpal tunnel syndrome bilateral  PRECAUTIONS: Shoulder - Acromioplasty (no specific referral limitations noted  Use of Spanish Interpreter in person SUBJECTIVE: ***  PAIN:  Are you having pain? {yes/no:20286} NPRS scale: ***/10 Pain location: *** Pain orientation: {Pain Orientation:25161}  PAIN TYPE: {type:313116} Pain description: {PAIN DESCRIPTION:21022940}  Aggravating factors: *** Relieving factors: ***  PATIENT GOALS Reduce pain   OBJECTIVE:   PATIENT SURVEYS:  07/10/2021 : FOTO intake: 53 predicted:  62   UPPER EXTREMITY AROM/PROM:   A/PROM Right 07/10/2021 Left 07/10/2021  Shoulder flexion Seated against gravity AROM 80 degrees with pain   Supine: AROM 110 c pain, passive 115    Shoulder extension      Shoulder abduction Supine passive 90 degrees with pain    Shoulder adduction      Shoulder internal rotation In supine 30 deg abduction AROM 80    Shoulder external rotation In supine 30 deg abduction AROM 60    Elbow flexion WFL    Elbow extension [facility]    Wrist flexion      Wrist extension      Wrist ulnar deviation      Wrist radial deviation      Wrist pronation      Wrist supination      (Blank rows = not tested)   UPPER EXTREMITY MMT:   MMT Right 07/10/2021 Left 07/10/2021  Shoulder flexion 2/5    Shoulder extension      Shoulder abduction 2/5    Shoulder adduction      Shoulder internal rotation 4/5    Shoulder external rotation 3/5    Middle trapezius      Lower trapezius      Elbow flexion      Elbow extension      Wrist flexion      Wrist extension      Wrist ulnar deviation      Wrist radial deviation      Wrist pronation      Wrist supination      Grip strength (lbs)      (Blank rows = not tested)   JOINT MOBILITY TESTING:  07/10/2021: Minimal restriction noted in all directions in mid range testing Rt shoulder    PALPATION:  07/10/2021: no specific tenderness noted to touch around Rt  shoulder    POSTURE:  07/10/2021: Rounded shoulders bilateral            TODAY'S TREATMENT:  07/17/2021   07/10/2021: Therapeutic Exercise:           Aerobic: Supine: Lt arm moving Rt into flexion 2-3 second hold x 10 Prone:           Seated: scapular retraction 5 sec hold x 10           Standing:  isometrics at doorway 5 sec hold x 10 Rt shoulder c arm at side 90 degrees elbow flexion.  Performed for flexion, abd, ER, IR each             Additional time in review of techniques and education for use at home for HEP.  Manual Therapy: PROM Rt shoulder, g2 inferior joint mobs for pain relief   PATIENT EDUCATION: 07/10/2021: Education details: HEP, POC Person educated: Patient Education method: Consulting civil engineer, Demonstration, Verbal cues, and Handouts Education comprehension: verbalized understanding and returned demonstration   HOME EXERCISE PROGRAM: 07/10/2021: Access Code: [facility] URL: [email or web address] Date: 07/10/2021 Prepared by: Scot Jun   Exercises Seated Scapular Retraction - 2-3 x daily - 7 x weekly - 1-2 sets - 10 reps - 5 hold Supine Shoulder Flexion AAROM with Hands Clasped - 2-3 x daily - 7 x weekly - 1-2 sets - 10 reps - 5 hold Standing Isometric Shoulder Internal Rotation at Doorway - 2 x daily - 7 x weekly - 1 sets - 15 reps - 5 hold Standing Isometric Shoulder External Rotation with Doorway (Mirrored) - 2 x daily - 7 x weekly - 1 sets - 15 reps - 5 hold Standing Isometric Shoulder Flexion with Doorway - Arm Bent - 2 x daily - 7 x weekly - 1 sets - 15 reps - 5 hold Standing Isometric Shoulder Abduction with Doorway - Arm Bent (Mirrored) - 2 x daily - 7 x weekly - 1 sets - 15 reps - 5 hold     ASSESSMENT:   CLINICAL IMPRESSION: ***  REHAB POTENTIAL: Good   CLINICAL DECISION MAKING: Stable/uncomplicated   EVALUATION COMPLEXITY: Low     GOALS: Goals reviewed with patient? Yes   SHORT TERM GOALS:   STG Name Target Date Goal status  1  Patient will demonstrate independent use of home exercise program to maintain progress from in clinic treatments.  07/31/2021 INITIAL                                                          LONG TERM GOALS:    LTG Name Target Date Goal status  1 Patient will demonstrate/report pain at worst less than or equal to 2/10 to facilitate minimal limitation in daily activity secondary to pain symptoms.  09/18/2021 INITIAL  2 Patient will demonstrate independent use of home exercise program to facilitate ability to maintain/progress functional gains from skilled physical therapy services.  09/18/2021 INITIAL  3 Patient will demonstrate FOTO outcome > or = 62% to indicated reduced disability due to condition. 09/18/2021 INITIAL  4 Patient will demonstrate Rt  GH joint mobility WFL to facilitate usual self care, dressing, reaching overhead at PLOF s limitation due to symptoms.  09/18/2021 INITIAL  5 Patient will demonstrate Rt UE MMT 5/5 throughout to facilitate usual lifting, carrying in functional activity to PLOF s limitation. 09/18/2021 INITIAL  6 Patient will demonstrate/report ability to return to work at [employer].    09/18/2021 INITIAL             PLAN: PT FREQUENCY: 2x/week   PT DURATION: 10 weeks   PLANNED INTERVENTIONS: Therapeutic exercises, Therapeutic activity, Neuro Muscular re-education, Balance training, Gait training, Patient/Family education, Joint mobilization, Stair training, DME instructions, Dry Needling, Electrical stimulation, Cryotherapy, Moist heat, Taping, Ultrasound, Ionotophoresis 4mg /ml Dexamethasone, and Manual therapy unless contraindicated   PLAN FOR NEXT SESSION: ***Review HEP, progressive transition into AAROM/AROM c gravity reduced as tolerated.     Jamey Reas, PT, DPT 07/17/2021, 7:37 AM

## 2021-07-22 ENCOUNTER — Encounter: Payer: BC Managed Care – PPO | Admitting: Rehabilitative and Restorative Service Providers"

## 2021-07-25 ENCOUNTER — Encounter: Payer: BC Managed Care – PPO | Admitting: Rehabilitative and Restorative Service Providers"

## 2021-07-29 ENCOUNTER — Other Ambulatory Visit: Payer: Self-pay

## 2021-07-29 ENCOUNTER — Ambulatory Visit (INDEPENDENT_AMBULATORY_CARE_PROVIDER_SITE_OTHER): Payer: BC Managed Care – PPO | Admitting: Rehabilitative and Restorative Service Providers"

## 2021-07-29 ENCOUNTER — Encounter: Payer: Self-pay | Admitting: Rehabilitative and Restorative Service Providers"

## 2021-07-29 DIAGNOSIS — R293 Abnormal posture: Secondary | ICD-10-CM

## 2021-07-29 DIAGNOSIS — M25511 Pain in right shoulder: Secondary | ICD-10-CM

## 2021-07-29 DIAGNOSIS — M6281 Muscle weakness (generalized): Secondary | ICD-10-CM

## 2021-07-29 DIAGNOSIS — R600 Localized edema: Secondary | ICD-10-CM

## 2021-07-29 DIAGNOSIS — G8929 Other chronic pain: Secondary | ICD-10-CM

## 2021-07-29 NOTE — Therapy (Signed)
OUTPATIENT PHYSICAL THERAPY TREATMENT NOTE   Patient Name: Alexandra Norris MRN: [id number] DOB:Oct 20, 1975, 46 y.o., female Today's Date: 07/29/2021  PCP: Orma Render, NP REFERRING PROVIDER: Orma Render, NP   PT End of Session - 07/29/21 [phone]     Visit Number 2    Number of Visits 20    Date for PT Re-Evaluation 09/18/21    Authorization Type BCBS    Authorization - Number of Visits 29    PT Start Time 0929    PT Stop Time 1007    PT Time Calculation (min) 38 min    Activity Tolerance Patient tolerated treatment well    Behavior During Therapy [facility] for tasks assessed/performed             Past Medical History:  Diagnosis Date   Anemia    Anemia    Anxiety    Arthritis    Asthma    Bilateral chronic knee pain 09/25/2015   Carpal tunnel syndrome    Depression    DEPRESSION, SITUATIONAL, PROLONGED 02/05/2009   Qualifier: Diagnosis of  By: Sherilyn Cooter  MD, Khary     Diabetes mellitus without complication ([place])    Dizziness 04/22/2021   Dyspnea    Elevated blood-pressure reading, without diagnosis of hypertension 06/17/2020   Endometriosis    Fatigue 04/22/2021   History of blood transfusion 1999   w/vaginal delivery   History of bronchitis    "I've stayed in hospital 2 X w/bronchitis"   Hyperlipidemia    Hypertension    Impingement syndrome of left shoulder    Influenza vaccine needed 04/03/2020   Left carpal tunnel syndrome 07/11/2019   Migraine    Neck pain 10/14/2020   Pneumonia    PONV (postoperative nausea and vomiting)    Pre-diabetes 10/03/2019   A1C 6.4   Right carpal tunnel syndrome 07/11/2019   S/P carpal tunnel release 08/30/2019   Seasonal allergies 10/08/2016   Shingles 11/14/2020   Past Surgical History:  Procedure Laterality Date   ABDOMINAL HYSTERECTOMY  10/17/2019   APPENDECTOMY     CARPAL TUNNEL RELEASE Right 07/19/2019   Procedure: RIGHT CARPAL TUNNEL RELEASE;  Surgeon: Leandrew Koyanagi, MD;  Location: [place];  Service: Orthopedics;  Laterality: Right;   CARPAL TUNNEL RELEASE Left 09/13/2019   Procedure: LEFT CARPAL TUNNEL RELEASE;  Surgeon: Leandrew Koyanagi, MD;  Location: [place];  Service: Orthopedics;  Laterality: Left;  Bier block   [place]; 2008   CHOLECYSTECTOMY  2011   SHOULDER ARTHROSCOPY WITH DISTAL CLAVICLE RESECTION  02/12/2021   Procedure: SHOULDER ARTHROSCOPY WITH DISTAL CLAVICLE RESECTION;  Surgeon: Leandrew Koyanagi, MD;  Location: [place];  Service: Orthopedics;;   SHOULDER ARTHROSCOPY WITH ROTATOR CUFF REPAIR AND SUBACROMIAL DECOMPRESSION Left 02/12/2021   Procedure: LEFT SHOULDER ARTHROSCOPY WITH ROTATOR CUFF REPAIR, SUBACROMIAL DECOMPRESSION, EXTENSIVE DEBRIDEMENT;  Surgeon: Leandrew Koyanagi, MD;  Location: [place];  Service: Orthopedics;  Laterality: Left;   TONSILLECTOMY     tonsilletomy     TUBAL LIGATION  04/2007   VAGINAL HYSTERECTOMY Bilateral 10/17/2019   Procedure: HYSTERECTOMY VAGINAL WITH SALPINGECTOMY;  Surgeon: Chancy Milroy, MD;  Location: [place];  Service: Gynecology;  Laterality: Bilateral;   Patient Active Problem List   Diagnosis Date Noted   Cough 07/15/2021   S/P arthroscopy of right shoulder 07/08/2021   Type 1 superior labral anterior-to-posterior (SLAP) tear of right shoulder 04/24/2021   Arthritis of  right acromioclavicular joint 04/24/2021   Chronic left shoulder pain 04/22/2021   Hypertension associated with diabetes ([place]) 04/22/2021   Hyperlipidemia associated with type 2 diabetes mellitus ([place]) 04/22/2021   Nontraumatic tear of left supraspinatus tendon 02/12/2021   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   Encounter to establish care 09/09/2020   Fatty liver disease, nonalcoholic 63/06/6008   Cervical disc disorder at C5-C6 level with radiculopathy 04/03/2020   Arthritis    Nasal obstruction 02/18/2016   DM2 (diabetes mellitus, type 2) ([place]) 10/21/2015   Vitamin D deficiency  09/25/2015   GASTROESOPHAGEAL REFLUX DISEASE 08/14/2009   Body mass index (BMI) 45.0-49.9, adult ([place]) 05/30/2007   Moderate persistent asthma with acute exacerbation 12/06/2006    REFERRING DIAG: REFERRING DIAG: Z98.890 (ICD-10-CM) - S/P arthroscopy of right shoulder  THERAPY DIAG:  Chronic right shoulder pain  Muscle weakness (generalized)  Abnormal posture  Localized edema  PERTINENT HISTORY: History of Lt shoulder surgery 02/12/2021, therapy here at clinic HTN, DM, hyperlipidemia, history of carpal tunnel syndrome bilateral  PRECAUTIONS: Shoulder - Acromioplasty (no specific referral limitations noted  SUBJECTIVE: Patient indicated feeling about 80 % better.  Pt indicated reaching behind back is troublesome still.  Was ill for several weeks.   PAIN:  Are you having pain? No NPRS scale: 0/10 Pain location: shoulder  Pain orientation: Rt PAIN TYPE:  Pain description:  Aggravating factors:  Relieving factors:        OBJECTIVE:        PATIENT SURVEYS:  07/10/2021 : FOTO intake: 53 predicted:  62   COGNITION:          Overall cognitive status: Within functional limits for tasks assessed                               SENSATION:          Light touch: Appears intact   General Localized edema Rt shoulder               UPPER EXTREMITY AROM/PROM:   A/PROM Right 07/10/2021 Left 07/10/2021 Right 07/29/2021  Shoulder flexion Seated against gravity AROM 80 degrees with pain   Supine: AROM 110 c pain, passive 115   WFL  Shoulder extension       Shoulder abduction Supine passive 90 degrees with pain   WFL  Shoulder adduction       Shoulder internal rotation In supine 30 deg abduction AROM 80   In supine 90 degrees abduction: AROM 75  Shoulder external rotation In supine 30 deg abduction AROM 60   WFL  Elbow flexion WFL     Elbow extension WFL     Wrist flexion       Wrist extension       Wrist ulnar deviation       Wrist radial deviation       Wrist pronation        Wrist supination       (Blank rows = not tested)   UPPER EXTREMITY MMT:   MMT Right 07/10/2021 Left 07/10/2021 Right 07/29/2021  Shoulder flexion 2/5   5/5  Shoulder extension       Shoulder abduction 2/5   5/5  Shoulder adduction       Shoulder internal rotation 4/5   5/5  Shoulder external rotation 3/5   5/5  Middle trapezius       Lower trapezius       Elbow flexion  Elbow extension       Wrist flexion       Wrist extension       Wrist ulnar deviation       Wrist radial deviation       Wrist pronation       Wrist supination       Grip strength (lbs)       (Blank rows = not tested)       JOINT MOBILITY TESTING:  07/10/2021: Minimal restriction noted in all directions in mid range testing Rt shoulder    PALPATION:  07/10/2021: no specific tenderness noted to touch around Rt shoulder    POSTURE:  07/10/2021: Rounded shoulders bilateral            TODAY'S TREATMENT:  07/29/2021: Therapeutic Exercise:  Tband green rows 3 x 10  Tband green gh ext 3 x 10  IR rope stretch behind back 30 sec x 3 Rt  UBE UE only lvl 3 4 mins fwd/back each way  Standing ball circles at wall with straight arm in 90 deg flexion 30 x 2 cw, ccw each                 Manual Therapy: contract/relax techniques for IR mobility gains in 90 deg abduction supine   07/10/2021: Therapeutic Exercise:           Aerobic: Supine: Lt arm moving Rt into flexion 2-3 second hold x 10 Prone:           Seated: scapular retraction 5 sec hold x 10           Standing:  isometrics at doorway 5 sec hold x 10 Rt shoulder c arm at side 90 degrees elbow flexion.  Performed for flexion, abd, ER, IR each             Additional time in review of techniques and education for use at home for HEP.      Manual Therapy: PROM Rt shoulder, g2 inferior joint mobs for pain relief         PATIENT EDUCATION: 07/10/2021: Education details: HEP, POC Person educated: Patient Education method: Consulting civil engineer, Demonstration,  Verbal cues, and Handouts Education comprehension: verbalized understanding and returned demonstration     HOME EXERCISE PROGRAM: 07/29/2021: Access Code: [facility] URL: [email or web address] Date: 07/10/2021 Prepared by: Scot Jun   Exercises Seated Scapular Retraction - 2-3 x daily - 7 x weekly - 1-2 sets - 10 reps - 5 hold Supine Shoulder Flexion AAROM with Hands Clasped - 2-3 x daily - 7 x weekly - 1-2 sets - 10 reps - 5 hold Standing Isometric Shoulder Internal Rotation at Doorway - 2 x daily - 7 x weekly - 1 sets - 15 reps - 5 hold Standing Isometric Shoulder External Rotation with Doorway (Mirrored) - 2 x daily - 7 x weekly - 1 sets - 15 reps - 5 hold Standing Isometric Shoulder Flexion with Doorway - Arm Bent - 2 x daily - 7 x weekly - 1 sets - 15 reps - 5 hold Standing Isometric Shoulder Abduction with Doorway - Arm Bent (Mirrored) - 2 x daily - 7 x weekly - 1 sets - 15 reps - 5 hold     ASSESSMENT:   CLINICAL IMPRESSION: Patient presented after layoff of time due to illness with overall good presentation.  AROM gained and strength progressed as well.  Pt to benefit from functional strengthening in various gravity dependent positioning and transition to include overhead movements to  continue gains towards long term functional goals.    REHAB POTENTIAL: Good   CLINICAL DECISION MAKING: Stable/uncomplicated   EVALUATION COMPLEXITY: Low     GOALS: Goals reviewed with patient? Yes   SHORT TERM GOALS:   STG Name Target Date Goal status  1 Patient will demonstrate independent use of home exercise program to maintain progress from in clinic treatments.  07/31/2021 Met Assessed 07/29/2021                                                          LONG TERM GOALS:    LTG Name Target Date Goal status  1 Patient will demonstrate/report pain at worst less than or equal to 2/10 to facilitate minimal limitation in daily activity secondary to pain symptoms.   09/18/2021 On going Assessed 07/29/2021  2 Patient will demonstrate independent use of home exercise program to facilitate ability to maintain/progress functional gains from skilled physical therapy services.  09/18/2021 On going Assessed 07/29/2021  3 Patient will demonstrate FOTO outcome > or = 62% to indicated reduced disability due to condition. 09/18/2021 On going Assessed 07/29/2021  4 Patient will demonstrate Rt  GH joint mobility WFL to facilitate usual self care, dressing, reaching overhead at PLOF s limitation due to symptoms.  09/18/2021 On going Assessed 07/29/2021  5 Patient will demonstrate Rt UE MMT 5/5 throughout to facilitate usual lifting, carrying in functional activity to PLOF s limitation. 09/18/2021 Met Assessed 07/29/2021  6 Patient will demonstrate/report ability to return to work at [employer].    09/18/2021 On going Assessed 07/29/2021             PLAN: PT FREQUENCY: 2x/week   PT DURATION: 10 weeks   PLANNED INTERVENTIONS: Therapeutic exercises, Therapeutic activity, Neuro Muscular re-education, Balance training, Gait training, Patient/Family education, Joint mobilization, Stair training, DME instructions, Dry Needling, Electrical stimulation, Cryotherapy, Moist heat, Taping, Ultrasound, Ionotophoresis 53m/ml Dexamethasone, and Manual therapy unless contraindicated   PLAN FOR NEXT SESSION: Resistance strengthening, functional lifting strengthening.     MScot Jun PT, DPT, OCS, ATC 07/29/21  10:07 AM

## 2021-08-01 ENCOUNTER — Encounter: Payer: BC Managed Care – PPO | Admitting: Rehabilitative and Restorative Service Providers"

## 2021-08-01 NOTE — Therapy (Incomplete)
OUTPATIENT PHYSICAL THERAPY TREATMENT NOTE   Patient Name: Alexandra Norris MRN: [id number] DOB:15-Jun-1975, 46 y.o., female Today's Date: 08/01/2021  PCP: Orma Render, NP REFERRING PROVIDER: Aundra Dubin, PA-C     Past Medical History:  Diagnosis Date   Anemia    Anemia    Anxiety    Arthritis    Asthma    Bilateral chronic knee pain 09/25/2015   Carpal tunnel syndrome    Depression    DEPRESSION, SITUATIONAL, PROLONGED 02/05/2009   Qualifier: Diagnosis of  By: Sherilyn Cooter  MD, Khary     Diabetes mellitus without complication ([place])    Dizziness 04/22/2021   Dyspnea    Elevated blood-pressure reading, without diagnosis of hypertension 06/17/2020   Endometriosis    Fatigue 04/22/2021   History of blood transfusion 1999   w/vaginal delivery   History of bronchitis    "I've stayed in hospital 2 X w/bronchitis"   Hyperlipidemia    Hypertension    Impingement syndrome of left shoulder    Influenza vaccine needed 04/03/2020   Left carpal tunnel syndrome 07/11/2019   Migraine    Neck pain 10/14/2020   Pneumonia    PONV (postoperative nausea and vomiting)    Pre-diabetes 10/03/2019   A1C 6.4   Right carpal tunnel syndrome 07/11/2019   S/P carpal tunnel release 08/30/2019   Seasonal allergies 10/08/2016   Shingles 11/14/2020   Past Surgical History:  Procedure Laterality Date   ABDOMINAL HYSTERECTOMY  10/17/2019   APPENDECTOMY     CARPAL TUNNEL RELEASE Right 07/19/2019   Procedure: RIGHT CARPAL TUNNEL RELEASE;  Surgeon: Leandrew Koyanagi, MD;  Location: [facility];  Service: Orthopedics;  Laterality: Right;   CARPAL TUNNEL RELEASE Left 09/13/2019   Procedure: LEFT CARPAL TUNNEL RELEASE;  Surgeon: Leandrew Koyanagi, MD;  Location: [facility];  Service: Orthopedics;  Laterality: Left;  Bier block   [place]; 2008   CHOLECYSTECTOMY  2011   SHOULDER ARTHROSCOPY WITH DISTAL CLAVICLE RESECTION  02/12/2021   Procedure: SHOULDER  ARTHROSCOPY WITH DISTAL CLAVICLE RESECTION;  Surgeon: Leandrew Koyanagi, MD;  Location: [facility];  Service: Orthopedics;;   SHOULDER ARTHROSCOPY WITH ROTATOR CUFF REPAIR AND SUBACROMIAL DECOMPRESSION Left 02/12/2021   Procedure: LEFT SHOULDER ARTHROSCOPY WITH ROTATOR CUFF REPAIR, SUBACROMIAL DECOMPRESSION, EXTENSIVE DEBRIDEMENT;  Surgeon: Leandrew Koyanagi, MD;  Location: [facility];  Service: Orthopedics;  Laterality: Left;   TONSILLECTOMY     tonsilletomy     TUBAL LIGATION  04/2007   VAGINAL HYSTERECTOMY Bilateral 10/17/2019   Procedure: HYSTERECTOMY VAGINAL WITH SALPINGECTOMY;  Surgeon: Chancy Milroy, MD;  Location: [facility];  Service: Gynecology;  Laterality: Bilateral;   Patient Active Problem List   Diagnosis Date Noted   Cough 07/15/2021   S/P arthroscopy of right shoulder 07/08/2021   Type 1 superior labral anterior-to-posterior (SLAP) tear of right shoulder 04/24/2021   Arthritis of right acromioclavicular joint 04/24/2021   Chronic left shoulder pain 04/22/2021   Hypertension associated with diabetes ([place]) 04/22/2021   Hyperlipidemia associated with type 2 diabetes mellitus ([place]) 04/22/2021   Nontraumatic tear of left supraspinatus tendon 02/12/2021   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   Encounter to establish care 09/09/2020   Fatty liver disease, nonalcoholic 35/36/1443   Cervical disc disorder at C5-C6 level with radiculopathy 04/03/2020   Arthritis    Nasal obstruction 02/18/2016   DM2 (diabetes mellitus, type 2) ([place]) 10/21/2015   Vitamin D deficiency 09/25/2015   GASTROESOPHAGEAL  REFLUX DISEASE 08/14/2009   Body mass index (BMI) 45.0-49.9, adult ([place]) 05/30/2007   Moderate persistent asthma with acute exacerbation 12/06/2006    REFERRING DIAG: REFERRING DIAG: Z98.890 (ICD-10-CM) - S/P arthroscopy of right shoulder  THERAPY DIAG:  No diagnosis found.  PERTINENT HISTORY: History of Lt shoulder surgery 02/12/2021, therapy here at clinic HTN,  DM, hyperlipidemia, history of carpal tunnel syndrome bilateral  PRECAUTIONS: Shoulder - Acromioplasty (no specific referral limitations noted  SUBJECTIVE: Patient indicated feeling about 80 % better.  Pt indicated reaching behind back is troublesome still.  Was ill for several weeks.   PAIN:  Are you having pain? No NPRS scale: 0/10 Pain location: shoulder  Pain orientation: Rt PAIN TYPE:  Pain description:  Aggravating factors:  Relieving factors:        OBJECTIVE:        PATIENT SURVEYS:  07/10/2021 : FOTO intake: 53 predicted:  62   COGNITION:          Overall cognitive status: Within functional limits for tasks assessed                               SENSATION:          Light touch: Appears intact   General Localized edema Rt shoulder               UPPER EXTREMITY AROM/PROM:   A/PROM Right 07/10/2021 Left 07/10/2021 Right 07/29/2021  Shoulder flexion Seated against gravity AROM 80 degrees with pain   Supine: AROM 110 c pain, passive 115   WFL  Shoulder extension       Shoulder abduction Supine passive 90 degrees with pain   WFL  Shoulder adduction       Shoulder internal rotation In supine 30 deg abduction AROM 80   In supine 90 degrees abduction: AROM 75  Shoulder external rotation In supine 30 deg abduction AROM 60   WFL  Elbow flexion WFL     Elbow extension WFL     Wrist flexion       Wrist extension       Wrist ulnar deviation       Wrist radial deviation       Wrist pronation       Wrist supination       (Blank rows = not tested)   UPPER EXTREMITY MMT:   MMT Right 07/10/2021 Left 07/10/2021 Right 07/29/2021  Shoulder flexion 2/5   5/5  Shoulder extension       Shoulder abduction 2/5   5/5  Shoulder adduction       Shoulder internal rotation 4/5   5/5  Shoulder external rotation 3/5   5/5  Middle trapezius       Lower trapezius       Elbow flexion       Elbow extension       Wrist flexion       Wrist extension       Wrist ulnar deviation        Wrist radial deviation       Wrist pronation       Wrist supination       Grip strength (lbs)       (Blank rows = not tested)       JOINT MOBILITY TESTING:  07/10/2021: Minimal restriction noted in all directions in mid range testing Rt shoulder    PALPATION:  07/10/2021: no specific tenderness  noted to touch around Rt shoulder    POSTURE:  07/10/2021: Rounded shoulders bilateral            TODAY'S TREATMENT:  07/29/2021: Therapeutic Exercise:  Tband green rows 3 x 10  Tband green gh ext 3 x 10  IR rope stretch behind back 30 sec x 3 Rt  UBE UE only lvl 3 4 mins fwd/back each way  Standing ball circles at wall with straight arm in 90 deg flexion 30 x 2 cw, ccw each                 Manual Therapy: contract/relax techniques for IR mobility gains in 90 deg abduction supine   07/10/2021: Therapeutic Exercise:           Aerobic: Supine: Lt arm moving Rt into flexion 2-3 second hold x 10 Prone:           Seated: scapular retraction 5 sec hold x 10           Standing:  isometrics at doorway 5 sec hold x 10 Rt shoulder c arm at side 90 degrees elbow flexion.  Performed for flexion, abd, ER, IR each             Additional time in review of techniques and education for use at home for HEP.      Manual Therapy: PROM Rt shoulder, g2 inferior joint mobs for pain relief         PATIENT EDUCATION: 07/10/2021: Education details: HEP, POC Person educated: Patient Education method: Consulting civil engineer, Demonstration, Verbal cues, and Handouts Education comprehension: verbalized understanding and returned demonstration     HOME EXERCISE PROGRAM: 07/29/2021: Access Code: [email or web address] URL: [email or web address] Date: 07/10/2021 Prepared by: Scot Jun   Exercises Seated Scapular Retraction - 2-3 x daily - 7 x weekly - 1-2 sets - 10 reps - 5 hold Supine Shoulder Flexion AAROM with Hands Clasped - 2-3 x daily - 7 x weekly - 1-2 sets - 10 reps - 5 hold Standing Isometric  Shoulder Internal Rotation at Doorway - 2 x daily - 7 x weekly - 1 sets - 15 reps - 5 hold Standing Isometric Shoulder External Rotation with Doorway (Mirrored) - 2 x daily - 7 x weekly - 1 sets - 15 reps - 5 hold Standing Isometric Shoulder Flexion with Doorway - Arm Bent - 2 x daily - 7 x weekly - 1 sets - 15 reps - 5 hold Standing Isometric Shoulder Abduction with Doorway - Arm Bent (Mirrored) - 2 x daily - 7 x weekly - 1 sets - 15 reps - 5 hold     ASSESSMENT:   CLINICAL IMPRESSION: Patient presented after layoff of time due to illness with overall good presentation.  AROM gained and strength progressed as well.  Pt to benefit from functional strengthening in various gravity dependent positioning and transition to include overhead movements to continue gains towards long term functional goals.    REHAB POTENTIAL: Good   CLINICAL DECISION MAKING: Stable/uncomplicated   EVALUATION COMPLEXITY: Low     GOALS: Goals reviewed with patient? Yes   SHORT TERM GOALS:   STG Name Target Date Goal status  1 Patient will demonstrate independent use of home exercise program to maintain progress from in clinic treatments.  07/31/2021 Met Assessed 07/29/2021  LONG TERM GOALS:    LTG Name Target Date Goal status  1 Patient will demonstrate/report pain at worst less than or equal to 2/10 to facilitate minimal limitation in daily activity secondary to pain symptoms.  09/18/2021 On going Assessed 07/29/2021  2 Patient will demonstrate independent use of home exercise program to facilitate ability to maintain/progress functional gains from skilled physical therapy services.  09/18/2021 On going Assessed 07/29/2021  3 Patient will demonstrate FOTO outcome > or = 62% to indicated reduced disability due to condition. 09/18/2021 On going Assessed 07/29/2021  4 Patient will demonstrate Rt  GH joint mobility WFL to facilitate usual self care,  dressing, reaching overhead at PLOF s limitation due to symptoms.  09/18/2021 On going Assessed 07/29/2021  5 Patient will demonstrate Rt UE MMT 5/5 throughout to facilitate usual lifting, carrying in functional activity to PLOF s limitation. 09/18/2021 Met Assessed 07/29/2021  6 Patient will demonstrate/report ability to return to work at [employer].    09/18/2021 On going Assessed 07/29/2021             PLAN: PT FREQUENCY: 2x/week   PT DURATION: 10 weeks   PLANNED INTERVENTIONS: Therapeutic exercises, Therapeutic activity, Neuro Muscular re-education, Balance training, Gait training, Patient/Family education, Joint mobilization, Stair training, DME instructions, Dry Needling, Electrical stimulation, Cryotherapy, Moist heat, Taping, Ultrasound, Ionotophoresis 65m/ml Dexamethasone, and Manual therapy unless contraindicated   PLAN FOR NEXT SESSION: Resistance strengthening, functional lifting strengthening.     MScot Jun PT, DPT, OCS, ATC 08/01/21  7:54 AM

## 2021-08-05 ENCOUNTER — Encounter: Payer: BC Managed Care – PPO | Admitting: Rehabilitative and Restorative Service Providers"

## 2021-08-06 ENCOUNTER — Encounter: Payer: BC Managed Care – PPO | Admitting: Physician Assistant

## 2021-08-07 ENCOUNTER — Encounter: Payer: BC Managed Care – PPO | Admitting: Rehabilitative and Restorative Service Providers"

## 2021-08-12 ENCOUNTER — Encounter: Payer: BC Managed Care – PPO | Admitting: Orthopaedic Surgery

## 2021-08-14 ENCOUNTER — Encounter: Payer: BC Managed Care – PPO | Admitting: Physical Therapy

## 2021-08-18 ENCOUNTER — Other Ambulatory Visit: Payer: Self-pay

## 2021-08-18 ENCOUNTER — Ambulatory Visit (HOSPITAL_BASED_OUTPATIENT_CLINIC_OR_DEPARTMENT_OTHER): Payer: BC Managed Care – PPO | Admitting: Nurse Practitioner

## 2021-08-18 ENCOUNTER — Encounter (HOSPITAL_BASED_OUTPATIENT_CLINIC_OR_DEPARTMENT_OTHER): Payer: Self-pay | Admitting: Nurse Practitioner

## 2021-08-18 ENCOUNTER — Other Ambulatory Visit (HOSPITAL_COMMUNITY): Payer: Self-pay

## 2021-08-18 VITALS — BP 130/100 | HR 86 | Ht 61.0 in | Wt 241.6 lb

## 2021-08-18 DIAGNOSIS — Z6841 Body Mass Index (BMI) 40.0 and over, adult: Secondary | ICD-10-CM

## 2021-08-18 DIAGNOSIS — E1165 Type 2 diabetes mellitus with hyperglycemia: Secondary | ICD-10-CM

## 2021-08-18 DIAGNOSIS — E785 Hyperlipidemia, unspecified: Secondary | ICD-10-CM

## 2021-08-18 DIAGNOSIS — I152 Hypertension secondary to endocrine disorders: Secondary | ICD-10-CM

## 2021-08-18 DIAGNOSIS — E1169 Type 2 diabetes mellitus with other specified complication: Secondary | ICD-10-CM | POA: Diagnosis not present

## 2021-08-18 DIAGNOSIS — F419 Anxiety disorder, unspecified: Secondary | ICD-10-CM | POA: Diagnosis not present

## 2021-08-18 DIAGNOSIS — K760 Fatty (change of) liver, not elsewhere classified: Secondary | ICD-10-CM

## 2021-08-18 DIAGNOSIS — E1159 Type 2 diabetes mellitus with other circulatory complications: Secondary | ICD-10-CM

## 2021-08-18 DIAGNOSIS — E559 Vitamin D deficiency, unspecified: Secondary | ICD-10-CM

## 2021-08-18 DIAGNOSIS — F32A Depression, unspecified: Secondary | ICD-10-CM

## 2021-08-18 MED ORDER — SERTRALINE HCL 50 MG PO TABS
50.0000 mg | ORAL_TABLET | Freq: Every day | ORAL | 11 refills | Status: DC
  Filled 2021-08-18: qty 30, 30d supply, fill #0

## 2021-08-18 MED ORDER — HYDROXYZINE HCL 50 MG PO TABS
50.0000 mg | ORAL_TABLET | Freq: Every evening | ORAL | 11 refills | Status: DC | PRN
  Filled 2021-08-18: qty 60, 30d supply, fill #0

## 2021-08-18 MED ORDER — LISINOPRIL 10 MG PO TABS
10.0000 mg | ORAL_TABLET | Freq: Every day | ORAL | 11 refills | Status: DC
  Filled 2021-08-18: qty 90, 90d supply, fill #0

## 2021-08-18 MED ORDER — TRULICITY 3 MG/0.5ML ~~LOC~~ SOAJ
3.0000 mg | SUBCUTANEOUS | 11 refills | Status: DC
  Filled 2021-08-18: qty 2, 28d supply, fill #0

## 2021-08-18 MED ORDER — BUSPIRONE HCL 5 MG PO TABS
5.0000 mg | ORAL_TABLET | Freq: Three times a day (TID) | ORAL | 11 refills | Status: DC | PRN
  Filled 2021-08-18: qty 90, 30d supply, fill #0

## 2021-08-18 MED ORDER — METFORMIN HCL 500 MG PO TABS
ORAL_TABLET | ORAL | 3 refills | Status: DC
  Filled 2021-08-18: qty 270, 90d supply, fill #0

## 2021-08-18 MED ORDER — ATORVASTATIN CALCIUM 20 MG PO TABS
20.0000 mg | ORAL_TABLET | Freq: Every day | ORAL | 3 refills | Status: DC
  Filled 2021-08-18: qty 90, 90d supply, fill #0

## 2021-08-18 NOTE — Assessment & Plan Note (Signed)
Chronic. Continue to reduce fat in diet and work on weight loss with activity. ?Will monitor labs today ?No alarm sx or abd pain present on examination.  ?

## 2021-08-18 NOTE — Assessment & Plan Note (Signed)
Chronic. Appears that medications have not been picked up in a while.  ?Will send refills x 1year to the pharmacy to avoid running out.  ?Will obtain labs today.  ?Recommend decrease in saturated fats and carbs and increase in activity with walking at least 20-30 minutes every day. ?Will f/u in 3 months ?

## 2021-08-18 NOTE — Assessment & Plan Note (Signed)
Uncontrolled. Has been off of medication for a week with BG in the mid and upper 200's. Prior to this good control.  ?No alarm sx present today.  ?Will obtain labs for evaluation ?Last eye exam in November of last year.  ?Increase Trulicity to '3mg'$  a week and refills send to pharmacy for 1 year.  ?Will obtain labs today ?F/U in 3 months or sooner if needed.  ?

## 2021-08-18 NOTE — Assessment & Plan Note (Signed)
Chronic. Not well controlled today.  ?Has been out of medication for 1 month.  ?Refills provided today with education on importance of notifying if she runs out of medications so that these can be filled and kept on hand.  ?Discussed importance of daily use of medication without missing doses to keep BP under control.  ?Continue diet low in saturated fat, carbohydrates, and low salt. ?Exercise by walking at least 20 minutes a day.  ?Labs today. ?Medications sent with 1 year refill to prevent running out.  ?F/U in 3 months ? ?

## 2021-08-18 NOTE — Patient Instructions (Addendum)
Thank you for letting me know about how you are feeling. This is very important to your health and I don't want you to ever feel like you cannot come to be about anything. I am here for you.  ? ?Gracias por dejarme saber c?mo te sientes. Esto es muy importante para su salud y no quiero que nunca sienta que no puede llegar a ser nada. Estoy aqui para ti. ? ?Nuevos medicamentos hoy: ?Sertraline '50mg'$  - take 1 pill every night at bedtime ?Hydroxyzine '50mg'$ - take 1 pill 30 minutes before bed as you need it to help with sleep. If this does not help you may take a second dose.  ?Buspirone '5mg'$ - take 1 pill up to 3 times during the day as you need it for anxiety. OK to take this every day. ? ?I am started you on a medication today to help with anxiety and depression. This medication is not addictive. It will help balance the chemicals that give you happy and calm thoughts and feelings to make them work like they should. The medication is called sertraline. It needs to be taken every night before bed. It should be taken every day without stopping to help. If in the future you are feeling better, we can talk about stopping the medication.  ? ? ?Hoy comenc? con un medicamento para ayudar con la ansiedad y la depresi?n. Este medicamento no es [place]. Ayudar? a Careers adviser los qu?micos que te dan pensamientos y sentimientos felices y tranquilos para que funcionen como deber?an. El medicamento se llama sertralina. Debe tomarse todas las noches antes de [place]. Se debe tomar todos los d?as sin parar para ayudar. Si en el futuro se siente mejor, podemos hablar sobre Psychologist, educational. ? ?I am sending in a medication to help with sleep when you need it. It is safe to take this every night if you need it. Take this about 30 minutes before you go to bed and it will help you relax and sleep. If you don't feel that the single pill is helpful, then you may take a second pill after 15-30 minutes to help you fall asleep. If this  does not work well for you, please let me know. This medication is not addictive and does not cause you to have to use it in order to sleep. It is safe to be taken every night if needed.  ? ?Estoy enviando un medicamento para ayudarlo a dormir cuando lo necesite. Es seguro tomarlo todas las noches si lo necesita. T?melo unos 30 minutos antes de acostarse y le ayudar? a Nurse, children's y dormir. Si no cree que la pastilla ?nica sea ?til, entonces puede tomar una segunda pastilla despu?s de 15 a 30 minutos para ayudarlo a conciliar el sue?o. Si esto no funciona bien para usted, por favor h?gamelo saber. Este medicamento no es [place] y no hace que tenga que usarlo para dormir. Es seguro tomarlo todas las noches si es necesario. ? ?I am also sending in another medication that you can take during the day for bad anxiety symptoms. If you are feeling overwhelmed or very sad you can take this up to 3 times during the day. It is called buspirone. This medication is not addictive and helps with the sertraline to keep your anxiety better. It is safe to take this every day and can be taken on a regular three times a day if you find that it is helpful for your anxiety.  ? ?Tambi?n estoy enviando otro medicamento que puede  tomar durante el d?a para los s?ntomas de ansiedad. Si se siente abrumado o muy triste, puede tomarlo hasta 3 veces al d?a. Se llama buspirona. Este medicamento no es [place] y [place] con la sertralina a Theatre manager mejor su ansiedad. Es seguro tomar esto todos los d?as y puede tomarse regularmente tres veces al d?a si encuentra que es ?til para su ansiedad. ? ?I want to follow-up with you in 4 weeks to see how you are feeling. If you want to do this as a phone call we can do this and I can get an interpreter to help with the call.  ? ?Quiero hacer un seguimiento con usted en 4 semanas para ver c?mo se siente. Si desea hacer esto como una llamada telef?nica, podemos hacerlo y puedo conseguir un int?rprete para ayudar  con la llamada. ? ?

## 2021-08-18 NOTE — Assessment & Plan Note (Signed)
Labs today

## 2021-08-18 NOTE — Progress Notes (Signed)
Established Patient Office Visit  Subjective:  Patient ID: Alexandra Norris, female    DOB: 02-06-1976  Age: 46 y.o. MRN: [id number]  CC:  Chief Complaint  Patient presents with   Follow-up    Patient presents today follow up diabetes. She starts her glucose is still running high 220 last night. She states she needs a refill on trulicity and lisinopril ( she is completely out) not taken in 1 month.     HPI Alexandra Norris presents for F/U DM, HTN, HLD.  She endorses: - ran out of BP medication about a month ago.  - ran out of Trulicity last week - BG have been significantly elevated since running out of medication.  -Not check BP at home - while on Trulicity BG is "very good" with numbers in the low to mid 100's.  - has noticed increased urination - denies ShOB, CP, Palpitations, LE edema, dizziness, wounds, foot sores, numbness or tingling in feet - endorses numbness and tingling in hands r/t shoulder surgery - Has changed diet to low carb and low fat  She would also like to discuss mood She endorses: - feelings of sadness, depression, and anxiety recently - feels short tempered at home with her family - concerned about the stigmas associated with mental health and taking medication within the [place] community.  - never has been on medication in past - she is concerned about possible addiction to medications.   - not sleeping well at night d/t racing thoughts     Outpatient Medications Prior to Visit  Medication Sig Dispense Refill   albuterol (VENTOLIN HFA) 108 (90 Base) MCG/ACT inhaler Inhale 2 puffs into the lungs in the morning and at bedtime. 18 g 2   albuterol (VENTOLIN HFA) 108 (90 Base) MCG/ACT inhaler Inhale 2 puffs into the lungs every 6 (six) hours as needed for wheezing or shortness of breath (Cough). 18 g 1   cetirizine (ZYRTEC ALLERGY) 10 MG tablet Take 1 tablet (10 mg total) by mouth at bedtime. 30 tablet 2   COVID-19 mRNA bivalent  vaccine, Pfizer, (PFIZER COVID-19 VAC BIVALENT) injection Inject into the muscle. 0.3 mL 0   fluticasone (FLONASE) 50 MCG/ACT nasal spray Place 2 sprays into both nostrils daily. 18 mL 0   Multiple Vitamin (MULTIVITAMIN WITH MINERALS) TABS tablet Take 1 tablet by mouth daily in the afternoon.     ondansetron (ZOFRAN) 4 MG tablet Take 1 tablet (4 mg total) by mouth every 8 (eight) hours as needed for nausea or vomiting. 40 tablet 0   Spacer/Aero-Holding Chambers (AEROCHAMBER PLUS FLO-VU LARGE) MISC See admin instructions.     atorvastatin (LIPITOR) 20 MG tablet Take 1 tablet (20 mg total) by mouth at bedtime. 90 tablet 3   diazepam (VALIUM) 5 MG tablet Take 1 tablet (5 mg total) by mouth every 12 (twelve) hours as needed for muscle spasms (shoulder pain). 10 tablet 0   Dulaglutide (TRULICITY) 1.5 LS/9.3TD SOPN Inject 1.5 mg into the skin once a week. 2 mL 6   lisinopril (ZESTRIL) 10 MG tablet Take 1 tablet (10 mg total) by mouth daily. 30 tablet 6   metFORMIN (GLUCOPHAGE) 500 MG tablet Take 1 tablet (500 mg total) by mouth daily with breakfast AND 2 tablets (1,000 mg total) with dinner 270 tablet 3   methylPREDNISolone (MEDROL DOSEPAK) 4 MG TBPK tablet Take 24 mg on day 1, 20 mg on day 2, 16 mg on day 3, 12 mg on day 4, 8 mg on  day 5, 4 mg on day 6. 21 tablet 0   naproxen (NAPROSYN) 500 MG tablet Take 1 tablet (500 mg total) by mouth 2 (two) times daily with a meal. 30 tablet 0   oxyCODONE-acetaminophen (PERCOCET) 5-325 MG tablet Take 1-2 tablets by mouth daily as needed for severe pain. 20 tablet 0   No facility-administered medications prior to visit.    No Known Allergies  ROS Review of Systems All review of systems negative except what is listed in the HPI    Objective:    Physical Exam Vitals and nursing note reviewed.  Constitutional:      Appearance: Normal appearance.  HENT:     Head: Normocephalic.  Eyes:     Extraocular Movements: Extraocular movements intact.      Conjunctiva/sclera: Conjunctivae normal.     Pupils: Pupils are equal, round, and reactive to light.  Neck:     Vascular: No carotid bruit.  Cardiovascular:     Rate and Rhythm: Regular rhythm. Tachycardia present.     Pulses: Normal pulses.     Heart sounds: Normal heart sounds.  Pulmonary:     Effort: Pulmonary effort is normal.     Breath sounds: Normal breath sounds.  Abdominal:     General: Bowel sounds are normal. There is no distension.     Palpations: Abdomen is soft.     Tenderness: There is no abdominal tenderness. There is no guarding.  Musculoskeletal:     Cervical back: Normal range of motion.     Right lower leg: No edema.     Left lower leg: No edema.  Skin:    General: Skin is warm and dry.     Capillary Refill: Capillary refill takes less than 2 seconds.  Neurological:     General: No focal deficit present.     Mental Status: She is alert and oriented to person, place, and time.  Psychiatric:        Mood and Affect: Mood normal.        Behavior: Behavior normal.        Thought Content: Thought content normal.        Judgment: Judgment normal.    BP (!) 130/100    Pulse 86    Ht _0  (1.549 m)    Wt 241 lb 9.6 oz (109.6 kg)    LMP 09/20/2019 (Exact Date)    SpO2 98%    BMI 45.65 kg/m  Wt Readings from Last 3 Encounters:  08/18/21 241 lb 9.6 oz (109.6 kg)  07/15/21 234 lb (106.1 kg)  07/08/21 236 lb 3.2 oz (107.1 kg)     Health Maintenance Due  Topic Date Due   OPHTHALMOLOGY EXAM  01/09/2021    There are no preventive care reminders to display for this patient.  Lab Results  Component Value Date   TSH 2.685 07/07/2014   Lab Results  Component Value Date   WBC 13.9 (H) 07/08/2021   HGB 13.0 07/08/2021   HCT 40.3 07/08/2021   MCV 80 07/08/2021   PLT 363 07/08/2021   Lab Results  Component Value Date   NA 135 07/03/2021   K 3.9 07/03/2021   CO2 22 07/03/2021   GLUCOSE 243 (H) 07/03/2021   BUN 5 (L) 07/03/2021   CREATININE 0.57 07/03/2021    BILITOT 0.6 04/22/2021   ALKPHOS 177 (H) 04/22/2021   AST 64 (H) 04/22/2021   ALT 76 (H) 04/22/2021   PROT 7.2 04/22/2021   ALBUMIN 4.3  04/22/2021   CALCIUM 8.8 (L) 07/03/2021   ANIONGAP 10 07/03/2021   EGFR 89 04/22/2021   Lab Results  Component Value Date   CHOL 202 (H) 04/22/2021   Lab Results  Component Value Date   HDL 45 04/22/2021   Lab Results  Component Value Date   LDLCALC 113 (H) 04/22/2021   Lab Results  Component Value Date   TRIG 252 (H) 04/22/2021   Lab Results  Component Value Date   CHOLHDL 4.5 (H) 04/22/2021   Lab Results  Component Value Date   HGBA1C 12.0 (H) 04/22/2021      Assessment & Plan:   Problem List Items Addressed This Visit     Anxiety and depression - Primary    New anxiety and depression.  Cultural stigma has kept her from seeking medication in the past, but she is ready to start something as she is significantly feeling overwhelmed right now.  Will start sertraline 23m qHS, buspar TID PRN, and Hydroxyzine for sleep.  Recommend f/u with phone visit in 4 weeks.  No alarm sx present today.  She is interested in counseling. Would like a spanish speaking provider. Will send that referral for her.        Relevant Medications   sertraline (ZOLOFT) 50 MG tablet   hydrOXYzine (ATARAX) 50 MG tablet   busPIRone (BUSPAR) 5 MG tablet   DM2 (diabetes mellitus, type 2) (HCC)    Uncontrolled. Has been off of medication for a week with BG in the mid and upper 200's. Prior to this good control.  No alarm sx present today.  Will obtain labs for evaluation Last eye exam in November of last year.  Increase Trulicity to 359ma week and refills send to pharmacy for 1 year.  Will obtain labs today F/U in 3 months or sooner if needed.       Relevant Medications   atorvastatin (LIPITOR) 20 MG tablet   Dulaglutide (TRULICITY) 3 MGMC/9.4BSOPN   lisinopril (ZESTRIL) 10 MG tablet   metFORMIN (GLUCOPHAGE) 500 MG tablet   Other Relevant Orders    CBC with Differential/Platelet   Comprehensive metabolic panel   Lipid panel   Hemoglobin A1c   VITAMIN D 25 Hydroxy (Vit-D Deficiency, Fractures)   TSH   Vitamin D deficiency    Labs today      Fatty liver disease, nonalcoholic    Chronic. Continue to reduce fat in diet and work on weight loss with activity. Will monitor labs today No alarm sx or abd pain present on examination.       Relevant Medications   atorvastatin (LIPITOR) 20 MG tablet   lisinopril (ZESTRIL) 10 MG tablet   metFORMIN (GLUCOPHAGE) 500 MG tablet   Other Relevant Orders   CBC with Differential/Platelet   Comprehensive metabolic panel   Lipid panel   Hemoglobin A1c   VITAMIN D 25 Hydroxy (Vit-D Deficiency, Fractures)   TSH   Hypertension associated with diabetes (HCC)    Chronic. Not well controlled today.  Has been out of medication for 1 month.  Refills provided today with education on importance of notifying if she runs out of medications so that these can be filled and kept on hand.  Discussed importance of daily use of medication without missing doses to keep BP under control.  Continue diet low in saturated fat, carbohydrates, and low salt. Exercise by walking at least 20 minutes a day.  Labs today. Medications sent with 1 year refill to prevent running out.  F/U in 3 months       Relevant Medications   atorvastatin (LIPITOR) 20 MG tablet   Dulaglutide (TRULICITY) 3 EH/8.7CU SOPN   lisinopril (ZESTRIL) 10 MG tablet   metFORMIN (GLUCOPHAGE) 500 MG tablet   Other Relevant Orders   CBC with Differential/Platelet   Comprehensive metabolic panel   Lipid panel   Hemoglobin A1c   VITAMIN D 25 Hydroxy (Vit-D Deficiency, Fractures)   TSH   Hyperlipidemia associated with type 2 diabetes mellitus (HCC)    Chronic. Appears that medications have not been picked up in a while.  Will send refills x 1year to the pharmacy to avoid running out.  Will obtain labs today.  Recommend decrease in  saturated fats and carbs and increase in activity with walking at least 20-30 minutes every day. Will f/u in 3 months      Relevant Medications   atorvastatin (LIPITOR) 20 MG tablet   Dulaglutide (TRULICITY) 3 NM/2.6YY SOPN   lisinopril (ZESTRIL) 10 MG tablet   metFORMIN (GLUCOPHAGE) 500 MG tablet   Other Relevant Orders   CBC with Differential/Platelet   Comprehensive metabolic panel   Lipid panel   Hemoglobin A1c   VITAMIN D 25 Hydroxy (Vit-D Deficiency, Fractures)   TSH   Body mass index (BMI) 45.0-49.9, adult (HCC)   Relevant Medications   Dulaglutide (TRULICITY) 3 OC/3.5GW SOPN   metFORMIN (GLUCOPHAGE) 500 MG tablet   Other Relevant Orders   CBC with Differential/Platelet   Comprehensive metabolic panel   Lipid panel   Hemoglobin A1c   VITAMIN D 25 Hydroxy (Vit-D Deficiency, Fractures)   TSH    Meds ordered this encounter  Medications   sertraline (ZOLOFT) 50 MG tablet    Sig: Take 1 tablet (50 mg total) by mouth daily.    Dispense:  30 tablet    Refill:  11   hydrOXYzine (ATARAX) 50 MG tablet    Sig: Take 1 tablet (50 mg total) by mouth at bedtime and may repeat dose one time if needed for anxiety and sleep.    Dispense:  60 tablet    Refill:  11   busPIRone (BUSPAR) 5 MG tablet    Sig: Take 1 tablet (5 mg total) by mouth 3 (three) times daily as needed.    Dispense:  90 tablet    Refill:  11   atorvastatin (LIPITOR) 20 MG tablet    Sig: Take 1 tablet (20 mg total) by mouth at bedtime.    Dispense:  90 tablet    Refill:  3   Dulaglutide (TRULICITY) 3 GY/5.2ET SOPN    Sig: Inject 3 mg as directed once a week.    Dispense:  2 mL    Refill:  11   lisinopril (ZESTRIL) 10 MG tablet    Sig: Take 1 tablet (10 mg total) by mouth daily.    Dispense:  90 tablet    Refill:  11   metFORMIN (GLUCOPHAGE) 500 MG tablet    Sig: Take 1 tablet (500 mg total) by mouth daily with breakfast AND 2 tablets (1,000 mg total) with dinner    Dispense:  270 tablet    Refill:  3     Follow-up: Return for 4 weeks virtual visit mood (72mneed interpreter), 3 month diabetes, HTN, HLD.    SOrma Render NP

## 2021-08-18 NOTE — Assessment & Plan Note (Signed)
New anxiety and depression.  ?Cultural stigma has kept her from seeking medication in the past, but she is ready to start something as she is significantly feeling overwhelmed right now.  ?Will start sertraline '50mg'$  qHS, buspar TID PRN, and Hydroxyzine for sleep.  ?Recommend f/u with phone visit in 4 weeks.  ?No alarm sx present today.  ?She is interested in counseling. Would like a spanish speaking provider. Will send that referral for her.  ? ?

## 2021-08-19 ENCOUNTER — Encounter: Payer: Self-pay | Admitting: Orthopaedic Surgery

## 2021-08-19 ENCOUNTER — Ambulatory Visit (INDEPENDENT_AMBULATORY_CARE_PROVIDER_SITE_OTHER): Payer: BC Managed Care – PPO | Admitting: Physician Assistant

## 2021-08-19 VITALS — Ht 61.0 in | Wt 241.0 lb

## 2021-08-19 DIAGNOSIS — Z9889 Other specified postprocedural states: Secondary | ICD-10-CM

## 2021-08-19 LAB — COMPREHENSIVE METABOLIC PANEL
ALT: 57 IU/L — ABNORMAL HIGH (ref 0–32)
AST: 66 IU/L — ABNORMAL HIGH (ref 0–40)
Albumin/Globulin Ratio: 1.5 (ref 1.2–2.2)
Albumin: 4.4 g/dL (ref 3.8–4.8)
Alkaline Phosphatase: 163 IU/L — ABNORMAL HIGH (ref 44–121)
BUN/Creatinine Ratio: 17 (ref 9–23)
BUN: 10 mg/dL (ref 6–24)
Bilirubin Total: 0.5 mg/dL (ref 0.0–1.2)
CO2: 23 mmol/L (ref 20–29)
Calcium: 9.1 mg/dL (ref 8.7–10.2)
Chloride: 98 mmol/L (ref 96–106)
Creatinine, Ser: 0.6 mg/dL (ref 0.57–1.00)
Globulin, Total: 3 g/dL (ref 1.5–4.5)
Glucose: 269 mg/dL — ABNORMAL HIGH (ref 70–99)
Potassium: 4.4 mmol/L (ref 3.5–5.2)
Sodium: 134 mmol/L (ref 134–144)
Total Protein: 7.4 g/dL (ref 6.0–8.5)
eGFR: 113 mL/min/{1.73_m2} (ref >59)

## 2021-08-19 LAB — CBC WITH DIFFERENTIAL/PLATELET
Basophils Absolute: 0 10*3/uL (ref 0.0–0.2)
Basos: 0 %
EOS (ABSOLUTE): 0.2 10*3/uL (ref 0.0–0.4)
Eos: 2 %
Hematocrit: 40.3 % (ref 34.0–46.6)
Hemoglobin: 12.8 g/dL (ref 11.1–15.9)
Immature Grans (Abs): 0 10*3/uL (ref 0.0–0.1)
Immature Granulocytes: 0 %
Lymphocytes Absolute: 2.5 10*3/uL (ref 0.7–3.1)
Lymphs: 28 %
MCH: 26.3 pg — ABNORMAL LOW (ref 26.6–33.0)
MCHC: 31.8 g/dL (ref 31.5–35.7)
MCV: 83 fL (ref 79–97)
Monocytes Absolute: 0.6 10*3/uL (ref 0.1–0.9)
Monocytes: 7 %
Neutrophils Absolute: 5.6 10*3/uL (ref 1.4–7.0)
Neutrophils: 63 %
Platelets: 255 10*3/uL (ref 150–450)
RBC: 4.87 x10E6/uL (ref 3.77–5.28)
RDW: 14.1 % (ref 11.7–15.4)
WBC: 9 10*3/uL (ref 3.4–10.8)

## 2021-08-19 LAB — VITAMIN D 25 HYDROXY (VIT D DEFICIENCY, FRACTURES): Vit D, 25-Hydroxy: 19.1 ng/mL — ABNORMAL LOW (ref 30.0–100.0)

## 2021-08-19 LAB — LIPID PANEL
Chol/HDL Ratio: 4.6 ratio — ABNORMAL HIGH (ref 0.0–4.4)
Cholesterol, Total: 211 mg/dL — ABNORMAL HIGH (ref 100–199)
HDL: 46 mg/dL (ref >39)
LDL Chol Calc (NIH): 131 mg/dL — ABNORMAL HIGH (ref 0–99)
Triglycerides: 189 mg/dL — ABNORMAL HIGH (ref 0–149)
VLDL Cholesterol Cal: 34 mg/dL (ref 5–40)

## 2021-08-19 LAB — HEMOGLOBIN A1C
Est. average glucose Bld gHb Est-mCnc: 212 mg/dL
Hgb A1c MFr Bld: 9 % — ABNORMAL HIGH (ref 4.8–5.6)

## 2021-08-19 LAB — TSH: TSH: 2.54 u[IU]/mL (ref 0.450–4.500)

## 2021-08-19 NOTE — Progress Notes (Addendum)
? ?Post-Op Visit Note ?  ?Patient: Alexandra Norris           ?Date of Birth: 12/11/1975           ?MRN: [id number] ?Visit Date: 08/19/2021 ?PCP: Orma Render, NP ? ? ?Assessment & Plan: ? ?Chief Complaint:  ?Chief Complaint  ?Patient presents with  ? Right Shoulder - Follow-up  ?  Right shoulder scope 06/25/2021  ? ?Visit Diagnoses:  ?1. S/P arthroscopy of right shoulder   ? ? ?Plan: Patient is a pleasant 46 year old female who comes in today approximately 8 weeks status post right shoulder arthroscopic debridement, decompression biceps tenotomy 06/25/2021.  She has been doing excellent.  Minimal to no pain.  She has finished outpatient physical therapy where she has regained full range of motion and strength.  She is also complaining of recurrent paresthesias to the right arm.  Previous nerve conduction study are remarkable for ulnar nerve compression.  She previously underwent cubital tunnel injection by Dr. Junius Roads in August 2022 with good relief until recently.  Complaining examination of the right shoulder reveals full active range of motion all planes.  Full strength throughout.  She is neurovascular intact distally.  At this point, she will continue to advance with activity as tolerated in regards to her shoulder.  In regards to the right cubital tunnel syndrome, would like to refer her to Dr. Bertram Millard with sports medicine for ultrasound-guided injection to the cubital tunnel.  Follow-up with [place] as needed.  ?Follow-Up Instructions: Return in about 4 weeks (around 09/16/2021).  ? ?Orders:  ?No orders of the defined types were placed in this encounter. ? ?No orders of the defined types were placed in this encounter. ? ? ?Imaging: ?No new imaging ? ?PMFS History: ?Patient Active Problem List  ? Diagnosis Date Noted  ? Cough 07/15/2021  ? S/P arthroscopy of right shoulder 07/08/2021  ? Type 1 superior labral anterior-to-posterior (SLAP) tear of right shoulder 04/24/2021  ? Arthritis of right  acromioclavicular joint 04/24/2021  ? Chronic left shoulder pain 04/22/2021  ? Hypertension associated with diabetes ([place]) 04/22/2021  ? Hyperlipidemia associated with type 2 diabetes mellitus ([place]) 04/22/2021  ? Nontraumatic tear of left supraspinatus tendon 02/12/2021  ? Encounter to establish care 09/09/2020  ? Fatty liver disease, nonalcoholic 03/14/1218  ? Cervical disc disorder at C5-C6 level with radiculopathy 04/03/2020  ? Arthritis   ? Nasal obstruction 02/18/2016  ? DM2 (diabetes mellitus, type 2) ([place]) 10/21/2015  ? Vitamin D deficiency 09/25/2015  ? Anxiety and depression 07/06/2014  ? GASTROESOPHAGEAL REFLUX DISEASE 08/14/2009  ? Body mass index (BMI) 45.0-49.9, adult ([place]) 05/30/2007  ? Moderate persistent asthma with acute exacerbation 12/06/2006  ? ?Past Medical History:  ?Diagnosis Date  ? Anemia   ? Anemia   ? Anxiety   ? Arthritis   ? Asthma   ? Bilateral chronic knee pain 09/25/2015  ? Carpal tunnel syndrome   ? Depression   ? DEPRESSION, SITUATIONAL, PROLONGED 02/05/2009  ? Qualifier: Diagnosis of  By: Sherilyn Cooter  MD, Hosie Poisson    ? Diabetes mellitus without complication ([place])   ? Dizziness 04/22/2021  ? Dyspnea   ? Elevated blood-pressure reading, without diagnosis of hypertension 06/17/2020  ? Endometriosis   ? Fatigue 04/22/2021  ? History of blood transfusion 1999  ? w/vaginal delivery  ? History of bronchitis   ? "I've stayed in hospital 2 X w/bronchitis"  ? Hyperlipidemia   ? Hypertension   ? Impingement syndrome of left shoulder   ?  Influenza vaccine needed 04/03/2020  ? Left carpal tunnel syndrome 07/11/2019  ? Migraine   ? Neck pain 10/14/2020  ? Pneumonia   ? PONV (postoperative nausea and vomiting)   ? 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 Pre-diabetes 10/03/2019  ? A1C 6.4  ? Right carpal tunnel syndrome 07/11/2019  ? S/P carpal tunnel release 08/30/2019  ? Seasonal allergies 10/08/2016  ? Shingles 11/14/2020  ?  ?Family History  ?Problem Relation Age of Onset  ? Diabetes Mother   ?  Osteoarthritis Mother   ? Hypertension Mother   ? Hyperlipidemia Mother   ? Arthritis Mother   ?  ?Past Surgical History:  ?Procedure Laterality Date  ? ABDOMINAL HYSTERECTOMY  10/17/2019  ? APPENDECTOMY    ? CARPAL TUNNEL RELEASE Right 07/19/2019  ? Procedure: RIGHT CARPAL TUNNEL RELEASE;  Surgeon: Leandrew Koyanagi, MD;  Location: [facility];  Service: Orthopedics;  Laterality: Right;  ? CARPAL TUNNEL RELEASE Left 09/13/2019  ? Procedure: LEFT CARPAL TUNNEL RELEASE;  Surgeon: Leandrew Koyanagi, MD;  Location: [facility];  Service: Orthopedics;  Laterality: Left;  Bier block  ? [place]; 2008  ? CHOLECYSTECTOMY  2011  ? SHOULDER ARTHROSCOPY WITH DISTAL CLAVICLE RESECTION  02/12/2021  ? Procedure: SHOULDER ARTHROSCOPY WITH DISTAL CLAVICLE RESECTION;  Surgeon: Leandrew Koyanagi, MD;  Location: [facility];  Service: Orthopedics;;  ? SHOULDER ARTHROSCOPY WITH ROTATOR CUFF REPAIR AND SUBACROMIAL DECOMPRESSION Left 02/12/2021  ? Procedure: LEFT SHOULDER ARTHROSCOPY WITH ROTATOR CUFF REPAIR, SUBACROMIAL DECOMPRESSION, EXTENSIVE DEBRIDEMENT;  Surgeon: Leandrew Koyanagi, MD;  Location: [facility];  Service: Orthopedics;  Laterality: Left;  ? TONSILLECTOMY    ? tonsilletomy    ? TUBAL LIGATION  04/2007  ? VAGINAL HYSTERECTOMY Bilateral 10/17/2019  ? Procedure: HYSTERECTOMY VAGINAL WITH SALPINGECTOMY;  Surgeon: Chancy Milroy, MD;  Location: [facility];  Service: Gynecology;  Laterality: Bilateral;  ? ?Social History  ? ?Occupational History  ? Not on file  ?Tobacco Use  ? Smoking status: Never  ? Smokeless tobacco: Never  ?Vaping Use  ? Vaping Use: Never used  ?Substance and Sexual Activity  ? Alcohol use: No  ? Drug use: No  ? Sexual activity: Yes  ?  Birth control/protection: Surgical  ? ? ? ?

## 2021-08-21 ENCOUNTER — Other Ambulatory Visit: Payer: Self-pay

## 2021-08-22 ENCOUNTER — Ambulatory Visit: Payer: BC Managed Care – PPO | Admitting: Orthopaedic Surgery

## 2021-09-02 ENCOUNTER — Other Ambulatory Visit: Payer: BC Managed Care – PPO

## 2021-09-15 ENCOUNTER — Ambulatory Visit (INDEPENDENT_AMBULATORY_CARE_PROVIDER_SITE_OTHER): Payer: BC Managed Care – PPO | Admitting: Nurse Practitioner

## 2021-09-15 ENCOUNTER — Other Ambulatory Visit (HOSPITAL_COMMUNITY): Payer: Self-pay

## 2021-09-15 ENCOUNTER — Encounter (HOSPITAL_BASED_OUTPATIENT_CLINIC_OR_DEPARTMENT_OTHER): Payer: Self-pay | Admitting: Nurse Practitioner

## 2021-09-15 DIAGNOSIS — F419 Anxiety disorder, unspecified: Secondary | ICD-10-CM

## 2021-09-15 DIAGNOSIS — F32A Depression, unspecified: Secondary | ICD-10-CM

## 2021-09-15 DIAGNOSIS — E1165 Type 2 diabetes mellitus with hyperglycemia: Secondary | ICD-10-CM

## 2021-09-15 DIAGNOSIS — E1169 Type 2 diabetes mellitus with other specified complication: Secondary | ICD-10-CM

## 2021-09-15 DIAGNOSIS — I152 Hypertension secondary to endocrine disorders: Secondary | ICD-10-CM

## 2021-09-15 DIAGNOSIS — E785 Hyperlipidemia, unspecified: Secondary | ICD-10-CM

## 2021-09-15 DIAGNOSIS — E1159 Type 2 diabetes mellitus with other circulatory complications: Secondary | ICD-10-CM

## 2021-09-15 DIAGNOSIS — K760 Fatty (change of) liver, not elsewhere classified: Secondary | ICD-10-CM

## 2021-09-15 MED ORDER — HYDROXYZINE HCL 50 MG PO TABS
50.0000 mg | ORAL_TABLET | Freq: Every evening | ORAL | 11 refills | Status: DC | PRN
  Filled 2021-09-15 – 2021-09-25 (×3): qty 60, 30d supply, fill #0

## 2021-09-15 MED ORDER — ATORVASTATIN CALCIUM 20 MG PO TABS
20.0000 mg | ORAL_TABLET | Freq: Every day | ORAL | 3 refills | Status: DC
  Filled 2021-09-15: qty 90, 90d supply, fill #0

## 2021-09-15 MED ORDER — ONDANSETRON HCL 4 MG PO TABS
4.0000 mg | ORAL_TABLET | Freq: Three times a day (TID) | ORAL | 2 refills | Status: DC | PRN
  Filled 2021-09-15: qty 40, 14d supply, fill #0

## 2021-09-15 MED ORDER — TRULICITY 3 MG/0.5ML ~~LOC~~ SOAJ
3.0000 mg | SUBCUTANEOUS | 11 refills | Status: DC
  Filled 2021-09-15 – 2021-09-24 (×2): qty 2, 28d supply, fill #0
  Filled 2021-09-25: qty 6, 84d supply, fill #0
  Filled 2021-11-21 – 2021-12-03 (×2): qty 6, 84d supply, fill #1
  Filled 2022-03-13: qty 2, 28d supply, fill #2

## 2021-09-15 MED ORDER — LISINOPRIL 10 MG PO TABS
10.0000 mg | ORAL_TABLET | Freq: Every day | ORAL | 3 refills | Status: DC
  Filled 2021-09-15: qty 90, 90d supply, fill #0

## 2021-09-15 MED ORDER — BUSPIRONE HCL 5 MG PO TABS
5.0000 mg | ORAL_TABLET | Freq: Three times a day (TID) | ORAL | 11 refills | Status: DC | PRN
Start: 2021-09-15 — End: 2022-03-26
  Filled 2021-09-15 – 2021-09-25 (×3): qty 90, 30d supply, fill #0
  Filled 2021-12-03: qty 90, 30d supply, fill #1

## 2021-09-15 MED ORDER — METFORMIN HCL 500 MG PO TABS
ORAL_TABLET | ORAL | 3 refills | Status: DC
  Filled 2021-09-15: qty 270, 90d supply, fill #0

## 2021-09-15 MED ORDER — SERTRALINE HCL 50 MG PO TABS
50.0000 mg | ORAL_TABLET | Freq: Every day | ORAL | 3 refills | Status: DC
  Filled 2021-09-15 – 2021-09-25 (×3): qty 90, 90d supply, fill #0

## 2021-09-15 NOTE — Assessment & Plan Note (Signed)
Weight loss of 6lbs with Trulicity. Doing very well working on diet and exercise.  ?Continue medications at current dose and continue with diet and exercise. Will plan to check labs in 3 months.  ?

## 2021-09-15 NOTE — Assessment & Plan Note (Signed)
No SE of medication.  ?Weight loss of 6lbs and improved diet and activity levels.  ?Suspect cholesterol is improving based on other findings.  ?Will continue medication daily and lifestyle changes.  ?Refills provided for 1 year. ?Plan to f/u in 3 months for in person visit and labs.  ?

## 2021-09-15 NOTE — Assessment & Plan Note (Addendum)
FBG in 110's on average with excellent diet and activity changes.  ?She is really doing fantastic with her lifestyle changes and managing her diabetes. I am very proud of her.  ?No SE from medication.  ?We will plan to continue the trulicity at the '3mg'$  and monitor.  ?Continue checking BG every morning and continue with diet changes and exercise.  ?Refills provided today for 1 year.  ?Will plan to f/u in 3 months for in person visit and labs.  ?

## 2021-09-15 NOTE — Assessment & Plan Note (Signed)
Unable to check BP at home today. No alarm symptoms present at this time. Tolerating medication well and taking medication daily without any missed doses.  ?Continue current medications. Refills provided today for 1 year.  ?Will plan to f/u in 3 months in person with labs.  ?

## 2021-09-15 NOTE — Patient Instructions (Signed)
I am so proud of you and all the work you doing to be healthy.  ?Keep it up and I know we will see big changes.  ? ?I am so glad that your mood is better. If you notice that your mood becomes down or you feel more anxious, I want you to let me know.  ? ?I will plan on seeing you in 3 months in the office and we can check your labs to make sure everything is looking good.  ? ?Let me know if you have any questions.  ? ?[employer] de ti y de todo el trabajo que est?s haciendo para estar saludable. ?Sigan as? y s? que veremos grandes cambios. ? ?Me alegro mucho de que tu estado de ?nimo sea mejor. Si notas que tu estado de ?nimo decae o te sientes m?s ansioso, quiero que me lo hagas saber. ? ?Planeo verlo en 3 meses en la oficina y podemos revisar sus laboratorios para asegurarnos de que todo se vea bien. ? ?Hazme saber si tienes alguna pregunta. ?

## 2021-09-15 NOTE — Progress Notes (Signed)
Virtual Visit Encounter telephone visit. ? ? ?I connected with  Alexandra Norris on 09/15/21 at  9:30 AM EDT by secure audio telemedicine application. I verified that I am speaking with the correct person using two identifiers. ?  ?I introduced myself as a Designer, jewellery with the practice. The limitations of evaluation and management by telemedicine discussed with the patient and the availability of in person appointments. The patient expressed verbal understanding and consent to proceed. ? ?Participating parties in this visit include: Myself and patient ? ?The patient is: Patient Location: Home ?I am: Provider Location: Office/Clinic ?Subjective:   ? ?CC and HPI: Alexandra Norris is a 46 y.o. year old female presenting for follow up of mood and diabetes. ? ?Diabetes ?Her fasting blood sugars have been in the low 100's ?She has been drinking a lot of water and walking several days a week.  ?She has lost about 6 pounds  ?She denies HA, CP, ShOB, increased thirst, increased urination.  ?She is monitoring carbs in her diet.  ?She denies nausea with Trulicity and is tolerating the dose very well.  ?She is very pleased with her progress and feels motivated.  ? ?Mood ?She tells me that her mood is much better with the medication.  ?She is not as anxious and she is sleeping well when she takes the hydroxyzine ?She denies any side effects of the medication and would like to continue ? ?Past medical history, Surgical history, Family history not pertinant except as noted below, Social history, Allergies, and medications have been entered into the medical record, reviewed, and corrections made.  ? ?Review of Systems:  ?All review of systems negative except what is listed in the HPI ? ?Objective:   ? ?Alert and oriented x 4 ?Speaking in clear sentences with no shortness of breath. ?No distress. ? ?Impression and Recommendations:   ? ?Problem List Items Addressed This Visit   ? ? Anxiety and  depression  ?  She is doing fantastic on sertraline, buspar, and hydroxyzine. Significant improvement in PHQ and GAD scores.  ?Plan to continue medication at current doses.  ?She will let me know if she has any changes to her mood or feels the medication is not working as well as it has.  ?Refills provided on all medications for 1 year.  ?We will plan to follow-up in 3 months or sooner if needed.  ?  ?  ? Relevant Medications  ? busPIRone (BUSPAR) 5 MG tablet  ? hydrOXYzine (ATARAX) 50 MG tablet  ? sertraline (ZOLOFT) 50 MG tablet  ? DM2 (diabetes mellitus, type 2) ([place])  ?  FBG in 110's on average with excellent diet and activity changes.  ?She is really doing fantastic with her lifestyle changes and managing her diabetes. I am very proud of her.  ?No SE from medication.  ?We will plan to continue the trulicity at the '3mg'$  and monitor.  ?Continue checking BG every morning and continue with diet changes and exercise.  ?Refills provided today for 1 year.  ?Will plan to f/u in 3 months for in person visit and labs.  ?  ?  ? Relevant Medications  ? atorvastatin (LIPITOR) 20 MG tablet  ? lisinopril (ZESTRIL) 10 MG tablet  ? metFORMIN (GLUCOPHAGE) 500 MG tablet  ? ondansetron (ZOFRAN) 4 MG tablet  ? Dulaglutide (TRULICITY) 3 YP/9.5KD SOPN  ? Fatty liver disease, nonalcoholic  ?  Weight loss of 6lbs with Trulicity. Doing very well working on diet and exercise.  ?  Continue medications at current dose and continue with diet and exercise. Will plan to check labs in 3 months.  ?  ?  ? Relevant Medications  ? atorvastatin (LIPITOR) 20 MG tablet  ? lisinopril (ZESTRIL) 10 MG tablet  ? metFORMIN (GLUCOPHAGE) 500 MG tablet  ? ondansetron (ZOFRAN) 4 MG tablet  ? Dulaglutide (TRULICITY) 3 EC/9.5QH SOPN  ? Hypertension associated with diabetes ([place])  ?  Unable to check BP at home today. No alarm symptoms present at this time. Tolerating medication well and taking medication daily without any missed doses.  ?Continue current  medications. Refills provided today for 1 year.  ?Will plan to f/u in 3 months in person with labs.  ?  ?  ? Relevant Medications  ? atorvastatin (LIPITOR) 20 MG tablet  ? lisinopril (ZESTRIL) 10 MG tablet  ? metFORMIN (GLUCOPHAGE) 500 MG tablet  ? Dulaglutide (TRULICITY) 3 KU/5.7DY SOPN  ? Hyperlipidemia associated with type 2 diabetes mellitus ([place])  ?  No SE of medication.  ?Weight loss of 6lbs and improved diet and activity levels.  ?Suspect cholesterol is improving based on other findings.  ?Will continue medication daily and lifestyle changes.  ?Refills provided for 1 year. ?Plan to f/u in 3 months for in person visit and labs.  ?  ?  ? Relevant Medications  ? atorvastatin (LIPITOR) 20 MG tablet  ? lisinopril (ZESTRIL) 10 MG tablet  ? metFORMIN (GLUCOPHAGE) 500 MG tablet  ? Dulaglutide (TRULICITY) 3 NX/8.3FP SOPN  ? ? ?current treatment plan is effective, no change in therapy ?I discussed the assessment and treatment plan with the patient. The patient was provided an opportunity to ask questions and all were answered. The patient agreed with the plan and demonstrated an understanding of the instructions. ?  ?The patient was advised to call back or seek an in-person evaluation if the symptoms worsen or if the condition fails to improve as anticipated. ? ?Follow-Up: in 3 months ? ?I provided 16 minutes of non-face-to-face interaction with this non face-to-face encounter including intake, same-day documentation, and chart review.  ? ?Orma Render, NP , DNP, AGNP-c ?[facility] ?Primary Care & Sports Medicine at [facility] ?[phone] ?[phone] (fax) ? ?

## 2021-09-15 NOTE — Assessment & Plan Note (Signed)
She is doing fantastic on sertraline, buspar, and hydroxyzine. Significant improvement in PHQ and GAD scores.  ?Plan to continue medication at current doses.  ?She will let me know if she has any changes to her mood or feels the medication is not working as well as it has.  ?Refills provided on all medications for 1 year.  ?We will plan to follow-up in 3 months or sooner if needed.  ?

## 2021-09-23 ENCOUNTER — Other Ambulatory Visit (HOSPITAL_COMMUNITY): Payer: Self-pay

## 2021-09-24 ENCOUNTER — Other Ambulatory Visit (HOSPITAL_BASED_OUTPATIENT_CLINIC_OR_DEPARTMENT_OTHER): Payer: Self-pay | Admitting: Nurse Practitioner

## 2021-09-24 ENCOUNTER — Other Ambulatory Visit (HOSPITAL_COMMUNITY): Payer: Self-pay

## 2021-09-24 MED ORDER — ALBUTEROL SULFATE HFA 108 (90 BASE) MCG/ACT IN AERS
2.0000 | INHALATION_SPRAY | Freq: Two times a day (BID) | RESPIRATORY_TRACT | 2 refills | Status: DC
  Filled 2021-09-24: qty 18, 25d supply, fill #0

## 2021-09-25 ENCOUNTER — Encounter (HOSPITAL_BASED_OUTPATIENT_CLINIC_OR_DEPARTMENT_OTHER): Payer: Self-pay | Admitting: Nurse Practitioner

## 2021-09-25 ENCOUNTER — Other Ambulatory Visit (HOSPITAL_COMMUNITY): Payer: Self-pay

## 2021-09-25 ENCOUNTER — Ambulatory Visit (INDEPENDENT_AMBULATORY_CARE_PROVIDER_SITE_OTHER): Payer: BC Managed Care – PPO | Admitting: Nurse Practitioner

## 2021-09-25 ENCOUNTER — Other Ambulatory Visit (HOSPITAL_BASED_OUTPATIENT_CLINIC_OR_DEPARTMENT_OTHER): Payer: Self-pay

## 2021-09-25 VITALS — BP 130/100 | HR 86 | Temp 101.2°F | Ht 61.0 in | Wt 239.8 lb

## 2021-09-25 DIAGNOSIS — J0120 Acute ethmoidal sinusitis, unspecified: Secondary | ICD-10-CM | POA: Diagnosis not present

## 2021-09-25 DIAGNOSIS — J45909 Unspecified asthma, uncomplicated: Secondary | ICD-10-CM

## 2021-09-25 DIAGNOSIS — Z9109 Other allergy status, other than to drugs and biological substances: Secondary | ICD-10-CM | POA: Diagnosis not present

## 2021-09-25 DIAGNOSIS — J329 Chronic sinusitis, unspecified: Secondary | ICD-10-CM | POA: Diagnosis not present

## 2021-09-25 DIAGNOSIS — B9689 Other specified bacterial agents as the cause of diseases classified elsewhere: Secondary | ICD-10-CM

## 2021-09-25 MED ORDER — HYDROCODONE BIT-HOMATROP MBR 5-1.5 MG/5ML PO SOLN
5.0000 mL | Freq: Every evening | ORAL | 0 refills | Status: DC
  Filled 2021-09-25: qty 60, 12d supply, fill #0

## 2021-09-25 MED ORDER — LEVOCETIRIZINE DIHYDROCHLORIDE 5 MG PO TABS
5.0000 mg | ORAL_TABLET | Freq: Every day | ORAL | 6 refills | Status: DC
  Filled 2021-09-25: qty 90, 90d supply, fill #0

## 2021-09-25 MED ORDER — FLUTICASONE PROPIONATE 50 MCG/ACT NA SUSP
2.0000 | Freq: Every day | NASAL | 6 refills | Status: DC
  Filled 2021-09-25: qty 16, 30d supply, fill #0

## 2021-09-25 MED ORDER — AMOXICILLIN-POT CLAVULANATE 875-125 MG PO TABS
1.0000 | ORAL_TABLET | Freq: Two times a day (BID) | ORAL | 0 refills | Status: DC
  Filled 2021-09-25: qty 10, 5d supply, fill #0

## 2021-09-25 MED ORDER — ALBUTEROL SULFATE HFA 108 (90 BASE) MCG/ACT IN AERS
2.0000 | INHALATION_SPRAY | Freq: Two times a day (BID) | RESPIRATORY_TRACT | 2 refills | Status: DC
  Filled 2021-09-25: qty 18, 30d supply, fill #0
  Filled 2021-09-25: qty 18, 25d supply, fill #0

## 2021-09-25 NOTE — Progress Notes (Signed)
? ?Acute Office Visit ? ?Subjective:  ? ?  ?Patient ID: Alexandra Norris, female    DOB: Jul 23, 1975, 46 y.o.   MRN: [id number] ? ?No chief complaint on file. ? ? ?HPI ?Patient is in today for symptoms of cough, sinus pain and pressure, fever, chills, fatigue, rhinorrhea that have been present for the past week.  Her symptoms initially started out as allergy-like symptoms but have continuously progressed and worsened over the last few days.  She has not been taking anything for this.  She reports in general that she feels very bad. ? ?ROS ?All review of systems negative except what is listed in the HPI ? ? ?   ?Objective:  ?  ?BP (!) 130/100   Pulse 86   Temp (!) 101.2 ?F (38.4 ?C)   Ht '5\' 1"'$  (1.549 m)   Wt 239 lb 12.8 oz (108.8 kg)   LMP 09/20/2019 (Exact Date)   SpO2 99%   BMI 45.31 kg/m?  ? ?Physical Exam ?Vitals and nursing note reviewed.  ?Constitutional:   ?   Appearance: She is ill-appearing and diaphoretic.  ?HENT:  ?   Head: Normocephalic.  ?   Right Ear: Tenderness present. A middle ear effusion is present. Tympanic membrane is bulging.  ?   Left Ear: Tenderness present. A middle ear effusion is present. Tympanic membrane is bulging.  ?   Nose:  ?   Right Turbinates: Enlarged, swollen and pale.  ?   Left Turbinates: Enlarged, swollen and pale.  ?   Right Sinus: Maxillary sinus tenderness and frontal sinus tenderness present.  ?   Left Sinus: Maxillary sinus tenderness and frontal sinus tenderness present.  ?   Mouth/Throat:  ?   Mouth: Mucous membranes are moist.  ?   Pharynx: Uvula midline. Pharyngeal swelling, oropharyngeal exudate and posterior oropharyngeal erythema present. No uvula swelling.  ?   Tonsils: Tonsillar exudate present. No tonsillar abscesses. 2+ on the right. 2+ on the left.  ?Cardiovascular:  ?   Rate and Rhythm: Regular rhythm. Tachycardia present.  ?   Pulses: Normal pulses.  ?   Heart sounds: Normal heart sounds.  ?Pulmonary:  ?   Breath sounds: Wheezing present.   ?Abdominal:  ?   General: Bowel sounds are normal.  ?   Palpations: Abdomen is soft.  ?Lymphadenopathy:  ?   Cervical: Cervical adenopathy present.  ?Skin: ?   General: Skin is warm.  ?   Capillary Refill: Capillary refill takes less than 2 seconds.  ?   Comments: Patient febrile  ?Neurological:  ?   General: No focal deficit present.  ?   Mental Status: She is alert.  ?Psychiatric:     ?   Mood and Affect: Mood normal.     ?   Behavior: Behavior normal.     ?   Thought Content: Thought content normal.     ?   Judgment: Judgment normal.  ? ? ?No results found for any visits on 09/25/21. ? ? ?   ?Assessment & Plan:  ? ?Problem List Items Addressed This Visit   ? ? Sinusitis, bacterial  ?  Symptoms and presentation consistent with acute nonrecurrent pansinusitis.  Given length of time symptoms have been present we will treat today for bacterial etiology.  Suspect that symptoms initially started with allergy exacerbation and have progressively worsened.  Patient does appear quite ill today and current symptoms appear to exacerbated her asthma.  We will send in treatment for both.  Patient encouraged to monitor symptoms closely and follow-up if her symptoms do not improve with treatment. ?Recommend daily allergy medication to help better control her allergy symptoms.  We will send prescription in for Xyzal and Flonase today. ?Recommend close monitoring of breathing and notify immediately if worsening wheezing, shortness of breath, dizziness, or difficulty breathing are noted. ? ?  ?  ? Relevant Medications  ? levocetirizine (XYZAL) 5 MG tablet  ? fluticasone (FLONASE) 50 MCG/ACT nasal spray  ? amoxicillin-clavulanate (AUGMENTIN) 875-125 MG tablet  ? HYDROcodone bit-homatropine (HYCODAN) 5-1.5 MG/5ML syrup  ? Asthma due to seasonal allergies  ? Relevant Medications  ? levocetirizine (XYZAL) 5 MG tablet  ? fluticasone (FLONASE) 50 MCG/ACT nasal spray  ? albuterol (VENTOLIN HFA) 108 (90 Base) MCG/ACT inhaler  ? ?Other  Visit Diagnoses   ? ? Environmental allergies    -  Primary  ? Relevant Medications  ? levocetirizine (XYZAL) 5 MG tablet  ? fluticasone (FLONASE) 50 MCG/ACT nasal spray  ? albuterol (VENTOLIN HFA) 108 (90 Base) MCG/ACT inhaler  ? ?  ? ? ?Meds ordered this encounter  ?Medications  ? levocetirizine (XYZAL) 5 MG tablet  ?  Sig: Take 1 tablet (5 mg total) by mouth at bedtime for allergies.  ?  Dispense:  30 tablet  ?  Refill:  6  ? fluticasone (FLONASE) 50 MCG/ACT nasal spray  ?  Sig: Place 2 sprays into both nostrils daily for allergies.  ?  Dispense:  16 g  ?  Refill:  6  ? amoxicillin-clavulanate (AUGMENTIN) 875-125 MG tablet  ?  Sig: Take 1 tablet by mouth 2 (two) times daily.  ?  Dispense:  10 tablet  ?  Refill:  0  ? albuterol (VENTOLIN HFA) 108 (90 Base) MCG/ACT inhaler  ?  Sig: Inhale 2 puffs into the lungs in the morning and at bedtime.  ?  Dispense:  18 g  ?  Refill:  2  ? HYDROcodone bit-homatropine (HYCODAN) 5-1.5 MG/5ML syrup  ?  Sig: Take 5 mLs by mouth at bedtime.  ?  Dispense:  60 mL  ?  Refill:  0  ? ? ?Return if symptoms worsen or fail to improve. ? ?Orma Render, NP ? ? ?

## 2021-09-25 NOTE — Patient Instructions (Addendum)
If you do not get better by Monday, please let me know. ?

## 2021-09-29 ENCOUNTER — Other Ambulatory Visit (HOSPITAL_BASED_OUTPATIENT_CLINIC_OR_DEPARTMENT_OTHER): Payer: Self-pay

## 2021-09-29 ENCOUNTER — Ambulatory Visit: Payer: Self-pay | Admitting: Dietician

## 2021-10-07 DIAGNOSIS — B9689 Other specified bacterial agents as the cause of diseases classified elsewhere: Secondary | ICD-10-CM

## 2021-10-07 HISTORY — DX: Other specified bacterial agents as the cause of diseases classified elsewhere: B96.89

## 2021-10-07 NOTE — Assessment & Plan Note (Addendum)
Symptoms and presentation consistent with acute nonrecurrent pansinusitis.  Given length of time symptoms have been present we will treat today for bacterial etiology.  Suspect that symptoms initially started with allergy exacerbation and have progressively worsened.  Patient does appear quite ill today and current symptoms appear to exacerbated her asthma.  We will send in treatment for both.  Patient encouraged to monitor symptoms closely and follow-up if her symptoms do not improve with treatment. ?Recommend daily allergy medication to help better control her allergy symptoms.  We will send prescription in for Xyzal and Flonase today. ?Recommend close monitoring of breathing and notify immediately if worsening wheezing, shortness of breath, dizziness, or difficulty breathing are noted. ?

## 2021-10-22 ENCOUNTER — Ambulatory Visit: Payer: BLUE CROSS/BLUE SHIELD | Admitting: Specialist

## 2021-11-21 ENCOUNTER — Encounter: Payer: Self-pay | Admitting: Specialist

## 2021-11-21 ENCOUNTER — Ambulatory Visit: Payer: BC Managed Care – PPO | Admitting: Specialist

## 2021-11-21 ENCOUNTER — Other Ambulatory Visit (HOSPITAL_COMMUNITY): Payer: Self-pay

## 2021-11-21 ENCOUNTER — Ambulatory Visit (INDEPENDENT_AMBULATORY_CARE_PROVIDER_SITE_OTHER): Payer: BC Managed Care – PPO

## 2021-11-21 VITALS — BP 119/78 | HR 82 | Ht 61.0 in | Wt 239.0 lb

## 2021-11-21 DIAGNOSIS — M542 Cervicalgia: Secondary | ICD-10-CM

## 2021-11-21 DIAGNOSIS — G8929 Other chronic pain: Secondary | ICD-10-CM

## 2021-11-21 DIAGNOSIS — M4322 Fusion of spine, cervical region: Secondary | ICD-10-CM

## 2021-11-21 DIAGNOSIS — M25512 Pain in left shoulder: Secondary | ICD-10-CM | POA: Diagnosis not present

## 2021-11-21 DIAGNOSIS — R202 Paresthesia of skin: Secondary | ICD-10-CM

## 2021-11-21 DIAGNOSIS — M25511 Pain in right shoulder: Secondary | ICD-10-CM

## 2021-11-21 DIAGNOSIS — M47812 Spondylosis without myelopathy or radiculopathy, cervical region: Secondary | ICD-10-CM

## 2021-11-21 DIAGNOSIS — R200 Anesthesia of skin: Secondary | ICD-10-CM

## 2021-11-21 DIAGNOSIS — G5621 Lesion of ulnar nerve, right upper limb: Secondary | ICD-10-CM

## 2021-11-21 MED ORDER — DICLOFENAC SODIUM 50 MG PO TBEC
50.0000 mg | DELAYED_RELEASE_TABLET | Freq: Two times a day (BID) | ORAL | 1 refills | Status: DC
  Filled 2021-11-21: qty 60, 30d supply, fill #0

## 2021-11-21 MED ORDER — GABAPENTIN 100 MG PO CAPS
100.0000 mg | ORAL_CAPSULE | Freq: Every day | ORAL | 3 refills | Status: DC
  Filled 2021-11-21: qty 30, 30d supply, fill #0

## 2021-11-21 NOTE — Patient Instructions (Signed)
Avoid overhead lifting and overhead use of the arms. Do not lift greater than 5 lbs. Adjust head rest in vehicle to prevent hyperextension if rear ended. Take extra precautions to avoid falling. MRI cervical as she describes a worsening of arm and neck pain. Lab Vitamin B12 and Folate and B1 levels Thyroid testing in the past was negative.

## 2021-11-21 NOTE — Progress Notes (Signed)
Office Visit Note   Patient: Alexandra Norris           Date of Birth: May 16, 1976           MRN: [id number] Visit Date: 11/21/2021              Requested by: Orma Render, NP [place],  [place] [address] PCP: Orma Render, NP   Assessment & Plan: Visit Diagnoses:  1. Cervical vertebral fusion   2. Neck pain   3. Cubital tunnel syndrome on right   4. Chronic left shoulder pain   5. Chronic right shoulder pain   6. Cervicalgia   7. Spondylosis without myelopathy or radiculopathy, cervical region   8. Bilateral numbness and tingling of arms and legs     Plan: Avoid overhead lifting and overhead use of the arms. Do not lift greater than 5 lbs. Adjust head rest in vehicle to prevent hyperextension if rear ended. Take extra precautions to avoid falling. MRI cervical as she describes a worsening of arm and neck pain. Lab Vitamin B12 and Folate and B1 levels Thyroid testing in the past was negative.   Follow-Up Instructions: Return in about 3 weeks (around 12/12/2021).   Orders:  Orders Placed This Encounter  Procedures   XR Cervical Spine 2 or 3 views   Vitamin B1   B12 and Folate Panel   Meds ordered this encounter  Medications   diclofenac (VOLTAREN) 50 MG EC tablet    Sig: Take 1 tablet (50 mg total) by mouth 2 (two) times daily.    Dispense:  60 tablet    Refill:  1   gabapentin (NEURONTIN) 100 MG capsule    Sig: Take 1 capsule (100 mg total) by mouth at bedtime.    Dispense:  30 capsule    Refill:  3      Procedures: No procedures performed   Clinical Data: Findings:  Alexandra Norris, Alexandra Norris  Procedures:  NCV with EMG(electromyography) Height:  '5\' 1"'$  (1.549 m) Weight:  239 lb (108.4 kg) Blood Pressure:  128/81   Accession Number:  [id number] Date of Study:  12/20/20 Ordering Provider:  Magnus Sinning, MD Clinical Indications:  Paresthesia of skin [R20.2 (ICD-10-CM)]   Interpreting Physicians   Performing Staff Result is not signed. No performing staff assigned to study.   Patient Information  Name MRN Description Alexandra Norris [id number] 46 y.o. female  Interpretation Summary  EMG & NCV Findings: All nerve conduction studies (as indicated in the following tables) were within normal limits.     All examined muscles (as indicated in the following table) showed no evidence of electrical instability.     Impression: Essentially NORMAL electrodiagnostic study of the right upper limb.  There is no significant electrodiagnostic evidence of nerve entrapment, brachial plexopathy or cervical radiculopathy.  As you know, purely sensory or demyelinating radiculopathies and chemical radiculitis may not be detected with this particular electrodiagnostic study.   Recommendations: 1.  Follow-up with referring physician. 2.  Continue current management of symptoms.   ___________________________ Laurence Spates FAAPMR Board Certified, American Board of Physical Medicine and Rehabilitation      Nerve Conduction Studies Anti Sensory Summary Table    Stim Site NR Peak (ms) Norm Peak (ms) P-T Amp (V) Norm P-T Amp Site1 Site2 Delta-P (ms) Dist (cm) Vel (m/s) Norm Vel (m/s) Right Median Acr Palm Anti Sensory (2nd Digit)  30.2C Wrist    3.2 <3.6 33.1 >10 Wrist Palm  1.6 0.0     Palm    1.6 <2.0 30.2               Right Radial Anti Sensory (Base 1st Digit)  30.1C Wrist    2.0 <3.1 28.8   Wrist Base 1st Digit 2.0 0.0     Right Ulnar Anti Sensory (5th Digit)  30.4C Wrist    3.1 <3.7 19.2 >15.0 Wrist 5th Digit 3.1 14.0 45 >38   Motor Summary Table    Stim Site NR Onset (ms) Norm Onset (ms) O-P Amp (mV) Norm O-P Amp Site1 Site2 Delta-0 (ms) Dist (cm) Vel (m/s) Norm Vel (m/s) Right Median Motor (Abd Poll Brev)  30.1C Wrist    3.1 <4.2 10.4 >5 Elbow Wrist 3.7 18.5 50 >50 Elbow    6.8   9.5               Right Ulnar Motor (Abd Dig Min)  30.3C Wrist    2.9 <4.2 8.8 >3 B  Elbow Wrist 2.7 18.5 69 >53 B Elbow    5.6   7.9   A Elbow B Elbow 1.3 10.0 77 >53 A Elbow    6.9   7.8                 EMG    Side Muscle Nerve Root Ins Act Fibs Psw Amp Dur Poly Recrt Int Fraser Din Comment Right 1stDorInt Ulnar C8-T1 Nml Nml Nml Nml Nml 0 Nml Nml   Right Abd Poll Brev Median C8-T1 Nml Nml Nml Nml Nml 0 Nml Nml   Right ExtDigCom     Nml Nml Nml Nml Nml 0 Nml Nml   Right Triceps Radial C6-7-8 Nml Nml Nml Nml Nml 0 Nml Nml   Right Deltoid Axillary C5-6 Nml Nml Nml Nml Nml 0 Nml Nml       Nerve Conduction Studies Anti Sensory Left/Right Comparison    Stim Site L Lat (ms) R Lat (ms) L-R Lat (ms) L Amp (V) R Amp (V) L-R Amp (%) Site1 Site2 L Vel (m/s) R Vel (m/s) L-R Vel (m/s) Median Acr Palm Anti Sensory (2nd Digit)  30.2C Wrist   3.2     33.1   Wrist Palm       Palm   1.6     30.2             Radial Anti Sensory (Base 1st Digit)  30.1C Wrist   2.0     28.8   Wrist Base 1st Digit       Ulnar Anti Sensory (5th Digit)  30.4C Wrist   3.1     19.2   Wrist 5th Digit   45     Motor Left/Right Comparison    Stim Site L Lat (ms) R Lat (ms) L-R Lat (ms) L Amp (mV) R Amp (mV) L-R Amp (%) Site1 Site2 L Vel (m/s) R Vel (m/s) L-R Vel (m/s) Median Motor (Abd Poll Brev)  30.1C Wrist   3.1     10.4   Elbow Wrist   50   Elbow   6.8     9.5             Ulnar Motor (Abd Dig Min)  30.3C Wrist   2.9     8.8   B Elbow Wrist   69   B Elbow   5.6     7.9   A Elbow B Elbow   77   A Elbow  6.9     7.8                  Waveforms:                Imaging  Imaging Information  Exam Information  Status Exam Begun  Exam Ended  Final [99]      Order-Level Documents:  There are no order-level documents.  Encounter-Level Documents on 12/20/2020:  Document on 12/20/2020 9:42 AM by Rayetta Humphrey D: After Visit Summary Electronic signature on 12/20/2020 9:09 AM - 1 of 3 e-signatures recorded     NCV with EMG (electromyography) Order: [id number] Status: Final result      Visible to patient: Yes (seen)     Dx: Paresthesia of skin     0 Result Notes   Details  Procedure Note EMG & NCV Findings: All nerve conduction studies (as indicated in the following tables) were within normal limits.     All examined muscles (as indicated in the following table) showed no evidence of electrical instability.     Impression: Essentially NORMAL electrodiagnostic study of the right upper limb.  There is no significant electrodiagnostic evidence of nerve entrapment, brachial plexopathy or cervical radiculopathy.  As you know, purely sensory or demyelinating radiculopathies and chemical radiculitis may not be detected with this particular electrodiagnostic study.   Recommendations: 1.  Follow-up with referring physician. 2.  Continue current management of symptoms.   ___________________________ Laurence Spates FAAPMR Board Certified, American Board of Physical Medicine and Rehabilitation      Nerve Conduction Studies Anti Sensory Summary Table     Stim Site NR Peak (ms) Norm Peak (ms) P-T Amp (V) Norm P-T Amp Site1 Site2 Delta-P (ms) Dist (cm) Vel (m/s) Norm Vel (m/s) Right Median Acr Palm Anti Sensory (2nd Digit)  30.2C Wrist    3.2 <3.6 33.1 >10 Wrist Palm 1.6 0.0     Palm    1.6 <2.0 30.2               Right Radial Anti Sensory (Base 1st Digit)  30.1C Wrist    2.0 <3.1 28.8   Wrist Base 1st Digit 2.0 0.0     Right Ulnar Anti Sensory (5th Digit)  30.4C Wrist    3.1 <3.7 19.2 >15.0 Wrist 5th Digit 3.1 14.0 45 >38   Motor Summary Table     Stim Site NR Onset (ms) Norm Onset (ms) O-P Amp (mV) Norm O-P Amp Site1 Site2 Delta-0 (ms) Dist (cm) Vel (m/s) Norm Vel (m/s) Right Median Motor (Abd Poll Brev)  30.1C Wrist    3.1 <4.2 10.4 >5 Elbow Wrist 3.7 18.5 50 >50 Elbow    6.8   9.5               Right Ulnar Motor (Abd Dig Min)  30.3C Wrist    2.9 <4.2 8.8 >3 B Elbow Wrist 2.7 18.5 69 >53 B Elbow    5.6   7.9   A Elbow B Elbow 1.3 10.0 77 >53 A Elbow     6.9   7.8                 EMG     Side Muscle Nerve Root Ins Act Fibs Psw Amp Dur Poly Recrt Int Fraser Din Comment Right 1stDorInt Ulnar C8-T1 Nml Nml Nml Nml Nml 0 Nml Nml   Right Abd Poll Brev Median C8-T1 Nml Nml Nml Nml Nml 0 Nml Nml   Right ExtDigCom  Nml Nml Nml Nml Nml 0 Nml Nml   Right Triceps Radial C6-7-8 Nml Nml Nml Nml Nml 0 Nml Nml   Right Deltoid Axillary C5-6 Nml Nml Nml Nml Nml 0 Nml Nml       Nerve Conduction Studies Anti Sensory Left/Right Comparison     Stim Site L Lat (ms) R Lat (ms) L-R Lat (ms) L Amp (V) R Amp (V) L-R Amp (%) Site1 Site2 L Vel (m/s) R Vel (m/s) L-R Vel (m/s) Median Acr Palm Anti Sensory (2nd Digit)  30.2C Wrist   3.2     33.1   Wrist Palm       Palm   1.6     30.2             Radial Anti Sensory (Base 1st Digit)  30.1C Wrist   2.0     28.8   Wrist Base 1st Digit       Ulnar Anti Sensory (5th Digit)  30.4C Wrist   3.1     19.2   Wrist 5th Digit   45     Motor Left/Right Comparison     Stim Site L Lat (ms) R Lat (ms) L-R Lat (ms) L Amp (mV) R Amp (mV) L-R Amp (%) Site1 Site2 L Vel (m/s) R Vel (m/s) L-R Vel (m/s) Median Motor (Abd Poll Brev)  30.1C Wrist   3.1     10.4   Elbow Wrist   50   Elbow   6.8     9.5             Ulnar Motor (Abd Dig Min)  30.3C Wrist   2.9     8.8   B Elbow Wrist   69   B Elbow   5.6     7.9   A Elbow B Elbow   77   A Elbow   6.9     7.8                  Waveforms:                   Last Resulted: 12/20/20 09:00    Order Details     View Encounter     Lab and Collection Details          Subjective: Chief Complaint  Patient presents with   Neck - Pain    46 year old right handed female with greater than 6 month history of neck pain. No bowel or bladder difficulty. Was seen last year with neck and shoulder and arm pain. She has had shoulder stiffness and pain into the back of her neck and head. She underwent left shoulder surgery in 02/2021 and right shoulder surgery 06/2021 by Dr.  Erlinda Hong these have healed and she completed physical therapy. She reports that she also had 3 injections of the neck, by Dr. Ernestina Patches with cortisone have been done and helped for a couple of weeks then the pain would return. She has been to PT with therapy directed at her neck by Dr. Erlinda Hong. She reports her neck pain is getting worse and that she is having pain more in the neck than the arm with numbness in the shoulders and trapexius and it is mainly in the neck and the neck muscles. She has some pain into the left cheek area and behind the left ear. Numbness into the arm and hands less so. There is pain with turning her head side to side.  She can turn her neck better towards the right more than the left. Bending the head backwards or forwards is painful. There is no difficulty with standing or walking. She has no increase in clumbsiness or loss of balance or coordination. Her hands and arm s the whole arm gets tired easily.     Review of Systems  Constitutional:  Positive for appetite change and unexpected weight change.  HENT: Negative.    Eyes: Negative.   Respiratory: Negative.    Cardiovascular: Negative.   Gastrointestinal: Negative.   Endocrine: Negative.   Genitourinary: Negative.   Musculoskeletal: Negative.   Skin: Negative.   Allergic/Immunologic: Negative.   Neurological: Negative.   Hematological: Negative.   Psychiatric/Behavioral: Negative.       Objective: Vital Signs: BP 119/78   Pulse 82   Ht '5\' 1"'$  (1.549 m)   Wt 239 lb (108.4 kg)   LMP 09/20/2019 (Exact Date)   BMI 45.16 kg/m   Physical Exam Constitutional:      Appearance: She is well-developed.  HENT:     Head: Normocephalic and atraumatic.  Eyes:     Pupils: Pupils are equal, round, and reactive to light.  Pulmonary:     Effort: Pulmonary effort is normal.     Breath sounds: Normal breath sounds.  Abdominal:     General: Bowel sounds are normal.     Palpations: Abdomen is soft.  Musculoskeletal:         General: Normal range of motion.     Cervical back: Normal range of motion and neck supple.  Skin:    General: Skin is warm and dry.  Neurological:     Mental Status: She is alert and oriented to person, place, and time.  Psychiatric:        Behavior: Behavior normal.        Thought Content: Thought content normal.        Judgment: Judgment normal.     Ortho Exam  Specialty Comments:  No specialty comments available.  Imaging: XR Cervical Spine 2 or 3 views  Result Date: 11/21/2021 AP and lateral flexion and extension radiographs of the cervical spine demonstrate ACDF C5-6 with cage and plate andscrews present, mild DDD at the adjacent C4-5 and C6-7 levels with some anterior calcification of the discs, no loosening of hardware and fusion appears solid at C5-6 level.     PMFS History: Patient Active Problem List   Diagnosis Date Noted   Sinusitis, bacterial 10/07/2021   Asthma due to seasonal allergies 09/25/2021   Cough 07/15/2021   S/P arthroscopy of right shoulder 07/08/2021   Type 1 superior labral anterior-to-posterior (SLAP) tear of right shoulder 04/24/2021   Arthritis of right acromioclavicular joint 04/24/2021   Chronic left shoulder pain 04/22/2021   Hypertension associated with diabetes ([place]) 04/22/2021   Hyperlipidemia associated with type 2 diabetes mellitus ([place]) 04/22/2021   Nontraumatic tear of left supraspinatus tendon 02/12/2021   Encounter to establish care 09/09/2020   Fatty liver disease, nonalcoholic 96/78/9381   Cervical disc disorder at C5-C6 level with radiculopathy 04/03/2020   Arthritis    Nasal obstruction 02/18/2016   DM2 (diabetes mellitus, type 2) ([facility]) 10/21/2015   Vitamin D deficiency 09/25/2015   Anxiety and depression 07/06/2014   GASTROESOPHAGEAL REFLUX DISEASE 08/14/2009   Body mass index (BMI) 45.0-49.9, adult ([facility]) 05/30/2007   Moderate persistent asthma with acute exacerbation 12/06/2006   Past Medical History:  Diagnosis Date    Anemia    Anemia  Anxiety    Arthritis    Asthma    Bilateral chronic knee pain 09/25/2015   Carpal tunnel syndrome    Depression    DEPRESSION, SITUATIONAL, PROLONGED 02/05/2009   Qualifier: Diagnosis of  By: Sherilyn Cooter  MD, Khary     Diabetes mellitus without complication ([facility])    Dizziness 04/22/2021   Dyspnea    Elevated blood-pressure reading, without diagnosis of hypertension 06/17/2020   Endometriosis    Fatigue 04/22/2021   History of blood transfusion 1999   w/vaginal delivery   History of bronchitis    "I've stayed in hospital 2 X w/bronchitis"   Hyperlipidemia    Hypertension    Impingement syndrome of left shoulder    Influenza vaccine needed 04/03/2020   Left carpal tunnel syndrome 07/11/2019   Migraine    Neck pain 10/14/2020   Pneumonia    PONV (postoperative nausea and vomiting)    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   Pre-diabetes 10/03/2019   A1C 6.4   Right carpal tunnel syndrome 07/11/2019   S/P carpal tunnel release 08/30/2019   Seasonal allergies 10/08/2016   Shingles 11/14/2020    Family History  Problem Relation Age of Onset   Diabetes Mother    Osteoarthritis Mother    Hypertension Mother    Hyperlipidemia Mother    Arthritis Mother     Past Surgical History:  Procedure Laterality Date   ABDOMINAL HYSTERECTOMY  10/17/2019   APPENDECTOMY     CARPAL TUNNEL RELEASE Right 07/19/2019   Procedure: RIGHT CARPAL TUNNEL RELEASE;  Surgeon: Leandrew Koyanagi, MD;  Location: [facility];  Service: Orthopedics;  Laterality: Right;   CARPAL TUNNEL RELEASE Left 09/13/2019   Procedure: LEFT CARPAL TUNNEL RELEASE;  Surgeon: Leandrew Koyanagi, MD;  Location: [facility];  Service: Orthopedics;  Laterality: Left;  Bier block   [place]; 2008   CHOLECYSTECTOMY  2011   SHOULDER ARTHROSCOPY WITH DISTAL CLAVICLE RESECTION  02/12/2021   Procedure: SHOULDER ARTHROSCOPY WITH DISTAL CLAVICLE RESECTION;  Surgeon: Leandrew Koyanagi, MD;   Location: [facility];  Service: Orthopedics;;   SHOULDER ARTHROSCOPY WITH ROTATOR CUFF REPAIR AND SUBACROMIAL DECOMPRESSION Left 02/12/2021   Procedure: LEFT SHOULDER ARTHROSCOPY WITH ROTATOR CUFF REPAIR, SUBACROMIAL DECOMPRESSION, EXTENSIVE DEBRIDEMENT;  Surgeon: Leandrew Koyanagi, MD;  Location: [facility];  Service: Orthopedics;  Laterality: Left;   TONSILLECTOMY     tonsilletomy     TUBAL LIGATION  04/2007   VAGINAL HYSTERECTOMY Bilateral 10/17/2019   Procedure: HYSTERECTOMY VAGINAL WITH SALPINGECTOMY;  Surgeon: Chancy Milroy, MD;  Location: [facility];  Service: Gynecology;  Laterality: Bilateral;   Social History   Occupational History   Not on file  Tobacco Use   Smoking status: Never   Smokeless tobacco: Never  Vaping Use   Vaping Use: Never used  Substance and Sexual Activity   Alcohol use: No   Drug use: No   Sexual activity: Yes    Birth control/protection: Surgical

## 2021-12-02 ENCOUNTER — Ambulatory Visit (INDEPENDENT_AMBULATORY_CARE_PROVIDER_SITE_OTHER): Payer: BC Managed Care – PPO | Admitting: Orthopaedic Surgery

## 2021-12-02 ENCOUNTER — Encounter: Payer: Self-pay | Admitting: Orthopaedic Surgery

## 2021-12-02 ENCOUNTER — Encounter (HOSPITAL_BASED_OUTPATIENT_CLINIC_OR_DEPARTMENT_OTHER): Payer: Self-pay | Admitting: Orthopaedic Surgery

## 2021-12-02 ENCOUNTER — Other Ambulatory Visit: Payer: Self-pay

## 2021-12-02 DIAGNOSIS — G5621 Lesion of ulnar nerve, right upper limb: Secondary | ICD-10-CM | POA: Diagnosis not present

## 2021-12-02 HISTORY — DX: Lesion of ulnar nerve, right upper limb: G56.21

## 2021-12-03 ENCOUNTER — Encounter (HOSPITAL_BASED_OUTPATIENT_CLINIC_OR_DEPARTMENT_OTHER)
Admission: RE | Admit: 2021-12-03 | Discharge: 2021-12-03 | Disposition: A | Discharge status: Home / Self Care | Payer: BC Managed Care – PPO | Source: Ambulatory Visit | Attending: Orthopaedic Surgery | Admitting: Orthopaedic Surgery

## 2021-12-03 ENCOUNTER — Other Ambulatory Visit (HOSPITAL_COMMUNITY): Payer: Self-pay

## 2021-12-03 DIAGNOSIS — Z01812 Encounter for preprocedural laboratory examination: Secondary | ICD-10-CM | POA: Diagnosis present

## 2021-12-03 DIAGNOSIS — E1165 Type 2 diabetes mellitus with hyperglycemia: Secondary | ICD-10-CM | POA: Diagnosis not present

## 2021-12-03 LAB — BASIC METABOLIC PANEL
Anion gap: 8 (ref 5–15)
BUN: 8 mg/dL (ref 6–20)
CO2: 24 mmol/L (ref 22–32)
Calcium: 8.9 mg/dL (ref 8.9–10.3)
Chloride: 104 mmol/L (ref 98–111)
Creatinine, Ser: 0.47 mg/dL (ref 0.44–1.00)
GFR, Estimated: >60 mL/min (ref >60)
Glucose, Bld: 265 mg/dL — ABNORMAL HIGH (ref 70–99)
Potassium: 4.7 mmol/L (ref 3.5–5.1)
Sodium: 136 mmol/L (ref 135–145)

## 2021-12-04 ENCOUNTER — Other Ambulatory Visit: Payer: Self-pay | Admitting: Nurse Practitioner

## 2021-12-04 DIAGNOSIS — Z1231 Encounter for screening mammogram for malignant neoplasm of breast: Secondary | ICD-10-CM

## 2021-12-05 ENCOUNTER — Ambulatory Visit
Admission: RE | Admit: 2021-12-05 | Discharge: 2021-12-05 | Disposition: A | Discharge status: Home / Self Care | Payer: BC Managed Care – PPO | Source: Ambulatory Visit | Attending: Nurse Practitioner | Admitting: Nurse Practitioner

## 2021-12-05 DIAGNOSIS — Z1231 Encounter for screening mammogram for malignant neoplasm of breast: Secondary | ICD-10-CM

## 2021-12-16 ENCOUNTER — Ambulatory Visit (INDEPENDENT_AMBULATORY_CARE_PROVIDER_SITE_OTHER): Payer: BC Managed Care – PPO | Admitting: Nurse Practitioner

## 2021-12-16 ENCOUNTER — Encounter (HOSPITAL_BASED_OUTPATIENT_CLINIC_OR_DEPARTMENT_OTHER): Payer: Self-pay | Admitting: Nurse Practitioner

## 2021-12-16 VITALS — BP 128/88 | HR 75 | Ht 61.0 in | Wt 238.0 lb

## 2021-12-16 DIAGNOSIS — E785 Hyperlipidemia, unspecified: Secondary | ICD-10-CM

## 2021-12-16 DIAGNOSIS — E1165 Type 2 diabetes mellitus with hyperglycemia: Secondary | ICD-10-CM

## 2021-12-16 DIAGNOSIS — E1159 Type 2 diabetes mellitus with other circulatory complications: Secondary | ICD-10-CM

## 2021-12-16 DIAGNOSIS — R0683 Snoring: Secondary | ICD-10-CM

## 2021-12-16 DIAGNOSIS — E1169 Type 2 diabetes mellitus with other specified complication: Secondary | ICD-10-CM | POA: Diagnosis not present

## 2021-12-16 DIAGNOSIS — K760 Fatty (change of) liver, not elsewhere classified: Secondary | ICD-10-CM

## 2021-12-16 DIAGNOSIS — F419 Anxiety disorder, unspecified: Secondary | ICD-10-CM

## 2021-12-16 DIAGNOSIS — I152 Hypertension secondary to endocrine disorders: Secondary | ICD-10-CM

## 2021-12-16 DIAGNOSIS — F32A Depression, unspecified: Secondary | ICD-10-CM

## 2021-12-16 DIAGNOSIS — Z6841 Body Mass Index (BMI) 40.0 and over, adult: Secondary | ICD-10-CM

## 2021-12-16 MED ORDER — PROMETHAZINE HCL 25 MG/ML IJ SOLN
INTRAMUSCULAR | Status: AC
  Filled 2021-12-16: qty 1

## 2021-12-16 NOTE — Progress Notes (Signed)
Alexandra Keeler, DNP, AGNP-c [facility] [address] [place], [place] [address] [phone] Office [phone] Fax  ESTABLISHED PATIENT- Chronic Health and/or Follow-Up Visit  Blood pressure 128/88, pulse 75, height '5\' 1"'$  (1.549 m), weight 238 lb (108 kg), last menstrual period 09/20/2019, SpO2 97 %.  Follow-up (Pt presents to follow up. She would like sleep study ( pend order), her husband says she stops breathing at night. Mood is much better feels meds are working.)   HPI  Alexandra Norris  is a 46 y.o. year old female presenting today for evaluation and management of the following: DM She tells me that her sugars are primarily in the 120's when fasting but never more than 150. She has been working diligintly to change her diet and increase her activity, which she feels is helping. She is monitoring her sugar regularly. She tells me that her feet are swelling, but she has not been drinking much water. The swelling does go down overnight. She tells me that she has had some vision changes. Mood She reports that her current medication and dosage are working well for her. She has been taking the sertraline daily without any side effects. She will occasionally take the buspar, but only once a day. She feels that this is a good balance for her and her depression and anxiety symptoms have significantly improved.  Sleep She is concerned with sleep apnea and would like to have a sleep study. She tells me that her husband reports that she stops breathing during the night multiple times and hour and she often gasps for air. She does have daytime fatigue.   ROS All ROS negative with exception of what is listed in HPI  PHYSICAL EXAM Physical Exam Vitals and nursing note reviewed.  Constitutional:      General: She is not in acute distress.    Appearance: Normal appearance.  HENT:     Head: Normocephalic.  Eyes:     Extraocular  Movements: Extraocular movements intact.     Conjunctiva/sclera: Conjunctivae normal.     Pupils: Pupils are equal, round, and reactive to light.  Neck:     Vascular: No carotid bruit.  Cardiovascular:     Rate and Rhythm: Normal rate and regular rhythm.     Pulses: Normal pulses.     Heart sounds: Normal heart sounds. No murmur heard. Pulmonary:     Effort: Pulmonary effort is normal.     Breath sounds: Normal breath sounds. No wheezing.  Abdominal:     General: Bowel sounds are normal. There is no distension.     Palpations: Abdomen is soft.     Tenderness: There is no abdominal tenderness. There is no guarding.  Musculoskeletal:        General: Normal range of motion.     Cervical back: Normal range of motion and neck supple.     Right lower leg: No edema.     Left lower leg: No edema.  Lymphadenopathy:     Cervical: No cervical adenopathy.  Skin:    General: Skin is warm and dry.     Capillary Refill: Capillary refill takes less than 2 seconds.  Neurological:     General: No focal deficit present.     Mental Status: She is alert and oriented to person, place, and time.  Psychiatric:        Mood and Affect: Mood normal.        Behavior: Behavior normal.  Thought Content: Thought content normal.        Judgment: Judgment normal.     ASSESSMENT & PLAN Problem List Items Addressed This Visit     Body mass index (BMI) 45.0-49.9, adult ([facility])    Chronic.  Continue with diet and exercise as well as GLP-1 medication for diabetes control and comorbid conditions.      Relevant Orders   Ambulatory referral to Neurology   CBC with Differential/Platelet (Completed)   Comprehensive metabolic panel (Completed)   Hemoglobin A1c (Completed)   Anxiety and depression - Primary    Significant improvement of anxiety and depression with sertraline.  I am very pleased with patient's response to the medication.  Recommend continued use of daily sertraline and as needed BuSpar for  her symptoms.  Discussed with patient that if she feels the medication is no longer effective or if her symptoms begin to get worse to please let me know immediately we can make any changes that are needed.  No alarm symptoms present at this time.  We will plan to continue to monitor routinely.      DM2 (diabetes mellitus, type 2) (HCC)    Chronic.  Patient has been having excellent blood sugar readings we will typical fasting readings under 120.  This is a significant improvement from her previous readings and she is really doing a great job.  She is not having any side effects from her medication.  At this time we will plan to continue her current medication and doses.  Recommend continued blood glucose monitoring, dietary changes, and exercise.  We will plan to follow-up in 3 months or sooner.        Relevant Orders   Ambulatory referral to Neurology   CBC with Differential/Platelet (Completed)   Comprehensive metabolic panel (Completed)   Hemoglobin A1c (Completed)   Fatty liver disease, nonalcoholic    Chronic.  She is working on diet, exercise, and weight loss.  I am hopeful that her efforts will significantly improve her condition.  She is not having any symptoms at this time.  Encourage patient to continue with diet, exercise, and medication for management.      Relevant Orders   Ambulatory referral to Neurology   CBC with Differential/Platelet (Completed)   Comprehensive metabolic panel (Completed)   Hemoglobin A1c (Completed)   Hypertension associated with diabetes (HCC)    Chronic.  Blood pressures are well controlled at this time.  She is not having any alarm symptoms.  We will plan to continue current regimen.  Diet and exercise encouraged.  Patient praised for all of her efforts and successes.  We will obtain labs today.  Plan follow-up in 3 months or sooner if needed.      Relevant Orders   Ambulatory referral to Neurology   CBC with Differential/Platelet (Completed)    Comprehensive metabolic panel (Completed)   Hemoglobin A1c (Completed)   Hyperlipidemia associated with type 2 diabetes mellitus (HCC)    Chronic.  She continues to work on diet and exercise to help manage.  No changes to plan of care at this time.      Relevant Orders   Ambulatory referral to Neurology   CBC with Differential/Platelet (Completed)   Comprehensive metabolic panel (Completed)   Hemoglobin A1c (Completed)   Snoring    Partner reported snoring with episodes of apnea multiple times throughout the night and episodes of gasping.  Patient is also experiencing some daytime fatigue.  Based on her current symptoms I do  feel that a sleep study would be appropriate.  She is currently working on diet and exercise for weight loss howeve this is not a rapid process and management may be needed while she continues to work.  We will plan to follow-up if symptoms we will send referral for sleep study today.        FOLLOW-UP No follow-ups on file.   Alexandra Keeler, DNP, AGNP-c 12/16/2021 11:13 AM

## 2021-12-16 NOTE — Patient Instructions (Signed)
Fue un Oceanographer. Espero que el tiempo que pas con nosotros haya sido agradable y til. Hganos saber si hay algo que podamos hacer para mejorar el servicio que recibe.   Le har saber si sus laboratorios se ven bien o si necesitamos hacer algn cambio.  Estoy tan orgulloso de ti!!!  He enviado la derivacin para el estudio del sueo, la oficina de neurologa lo llamar para Designer, jewellery.  It was a pleasure seeing you today. I hope your time spent with [place] was pleasant and helpful. Please let us know if there is anything we can do to improve the service you receive.   I will let you know if your labs look ok or if we need to make any changes.  I am SO PROUD OF YOU!!!  I ave sent the referral for the sleep study, the neurology office will call you to schedule this.   The following orders have been placed for you today:  Orders Placed This Encounter  Procedures   CBC with Differential/Platelet    Order Specific Question:   Release to patient    Answer:   Immediate   Comprehensive metabolic panel    Order Specific Question:   Has the patient fasted?    Answer:   Yes    Order Specific Question:   Release to patient    Answer:   Immediate   Hemoglobin A1c    Order Specific Question:   Release to patient    Answer:   Immediate   Ambulatory referral to Neurology    Referral Priority:   Routine    Referral Type:   Consultation    Referral Reason:   Specialty Services Required    Requested Specialty:   Neurology    Number of Visits Requested:   1     Important Office Information Lab Results If labs were ordered, please note that you will see results through [facility] as soon as they come available from [facility].  It takes up to 5 business days for the results to be routed to me and for me to review them once all of the lab results have come through from [facility]. I will make recommendations based on your results and send these through [place] or someone from the office will call  you to discuss. If your labs are abnormal, we may contact you to schedule a visit to discuss the results and make recommendations.  If you have not heard from [place] within 5 business days or you have waited longer than a week and your lab results have not come through on [place], please feel free to call the office or send a message through [place] to follow-up on these labs.   Referrals If referrals were placed today, the office where the referral was sent will contact you either by phone or through [place] to set up scheduling. Please note that it can take up to a week for the referral office to contact you. If you do not hear from them in a week, please contact the referral office directly to inquire about scheduling.   Condition Treated If your condition worsens or you begin to have new symptoms, please schedule a follow-up appointment for further evaluation. If you are not sure if an appointment is needed, you may call the office to leave a message for the nurse and someone will contact you with recommendations.  If you have an urgent or life threatening emergency, please do not call the office, but seek  emergency evaluation by calling 911 or going to the nearest emergency room for evaluation.   MyChart and Phone Calls Please do not use MyChart for urgent messages. It may take up to 3 business days for MyChart messages to be read by staff and if they are unable to handle the request, an additional 3 business days for them to be routed to me and for my response.  Messages sent to the provider through [place] do not come directly to the provider, please allow time for these messages to be routed and for me to respond.  We get a large volume of MyChart messages daily and these are responded to in the order received.   For urgent messages, please call the office at [phone] and speak with the front office staff or leave a message on the line of my assistant for guidance.  We are seeing patients  from the hours of 8:00 am through 5:00 pm and calls directly to the nurse may not be answered immediately due to seeing patients, but your call will be returned as soon as possible.  Phone  messages received after 4:00 PM Monday through Thursday may not be returned until the following business day. Phone messages received after 11:00 AM on Friday may not be returned until Monday.   After Hours We share on call hours with providers from other offices. If you have an urgent need after hours that cannot wait until the next business day, please contact the on call provider by calling the office number. A nurse will speak with you and contact the provider if needed for recommendations.  If you have an urgent or life threatening emergency after hours, please do not call the on call provider, but seek emergency evaluation by calling 911 or going to the nearest emergency room for evaluation.   Paperwork All paperwork requires a minimum of 5 days to complete and return to you or the designated personnel. Please keep this in mind when bringing in forms or sending requests for paperwork completion to the office.

## 2021-12-17 ENCOUNTER — Ambulatory Visit (HOSPITAL_BASED_OUTPATIENT_CLINIC_OR_DEPARTMENT_OTHER): Payer: BC Managed Care – PPO | Admitting: Anesthesiology

## 2021-12-17 ENCOUNTER — Encounter (HOSPITAL_BASED_OUTPATIENT_CLINIC_OR_DEPARTMENT_OTHER): Payer: Self-pay | Admitting: Orthopaedic Surgery

## 2021-12-17 ENCOUNTER — Ambulatory Visit (HOSPITAL_BASED_OUTPATIENT_CLINIC_OR_DEPARTMENT_OTHER)
Admission: RE | Admit: 2021-12-17 | Discharge: 2021-12-17 | Disposition: A | Discharge status: Home / Self Care | Payer: BC Managed Care – PPO | Attending: Orthopaedic Surgery | Admitting: Orthopaedic Surgery

## 2021-12-17 ENCOUNTER — Other Ambulatory Visit (HOSPITAL_COMMUNITY): Payer: Self-pay

## 2021-12-17 ENCOUNTER — Other Ambulatory Visit: Payer: Self-pay

## 2021-12-17 ENCOUNTER — Encounter (HOSPITAL_BASED_OUTPATIENT_CLINIC_OR_DEPARTMENT_OTHER)
Admission: RE | Disposition: A | Discharge status: Home / Self Care | Payer: Self-pay | Source: Home / Self Care | Attending: Orthopaedic Surgery

## 2021-12-17 DIAGNOSIS — E119 Type 2 diabetes mellitus without complications: Secondary | ICD-10-CM | POA: Insufficient documentation

## 2021-12-17 DIAGNOSIS — Z7984 Long term (current) use of oral hypoglycemic drugs: Secondary | ICD-10-CM | POA: Diagnosis not present

## 2021-12-17 DIAGNOSIS — G5621 Lesion of ulnar nerve, right upper limb: Secondary | ICD-10-CM | POA: Diagnosis present

## 2021-12-17 DIAGNOSIS — I10 Essential (primary) hypertension: Secondary | ICD-10-CM | POA: Diagnosis not present

## 2021-12-17 DIAGNOSIS — E1165 Type 2 diabetes mellitus with hyperglycemia: Secondary | ICD-10-CM

## 2021-12-17 HISTORY — PX: ULNAR TUNNEL RELEASE: SHX820

## 2021-12-17 LAB — CBC WITH DIFFERENTIAL/PLATELET
Basophils Absolute: 0 10*3/uL (ref 0.0–0.2)
Basos: 0 %
EOS (ABSOLUTE): 0.3 10*3/uL (ref 0.0–0.4)
Eos: 3 %
Hematocrit: 41.4 % (ref 34.0–46.6)
Hemoglobin: 13.3 g/dL (ref 11.1–15.9)
Immature Grans (Abs): 0 10*3/uL (ref 0.0–0.1)
Immature Granulocytes: 0 %
Lymphocytes Absolute: 3.1 10*3/uL (ref 0.7–3.1)
Lymphs: 32 %
MCH: 26.4 pg — ABNORMAL LOW (ref 26.6–33.0)
MCHC: 32.1 g/dL (ref 31.5–35.7)
MCV: 82 fL (ref 79–97)
Monocytes Absolute: 0.6 10*3/uL (ref 0.1–0.9)
Monocytes: 6 %
Neutrophils Absolute: 5.5 10*3/uL (ref 1.4–7.0)
Neutrophils: 59 %
Platelets: 279 10*3/uL (ref 150–450)
RBC: 5.03 x10E6/uL (ref 3.77–5.28)
RDW: 14.7 % (ref 11.7–15.4)
WBC: 9.6 10*3/uL (ref 3.4–10.8)

## 2021-12-17 LAB — COMPREHENSIVE METABOLIC PANEL
ALT: 92 IU/L — ABNORMAL HIGH (ref 0–32)
AST: 94 IU/L — ABNORMAL HIGH (ref 0–40)
Albumin/Globulin Ratio: 1.4 (ref 1.2–2.2)
Albumin: 4.3 g/dL (ref 3.9–4.9)
Alkaline Phosphatase: 147 IU/L — ABNORMAL HIGH (ref 44–121)
BUN/Creatinine Ratio: 11 (ref 9–23)
BUN: 6 mg/dL (ref 6–24)
Bilirubin Total: 0.6 mg/dL (ref 0.0–1.2)
CO2: 22 mmol/L (ref 20–29)
Calcium: 8.9 mg/dL (ref 8.7–10.2)
Chloride: 102 mmol/L (ref 96–106)
Creatinine, Ser: 0.56 mg/dL — ABNORMAL LOW (ref 0.57–1.00)
Globulin, Total: 3.1 g/dL (ref 1.5–4.5)
Glucose: 122 mg/dL — ABNORMAL HIGH (ref 70–99)
Potassium: 4.5 mmol/L (ref 3.5–5.2)
Sodium: 139 mmol/L (ref 134–144)
Total Protein: 7.4 g/dL (ref 6.0–8.5)
eGFR: 115 mL/min/{1.73_m2} (ref >59)

## 2021-12-17 LAB — HEMOGLOBIN A1C
Est. average glucose Bld gHb Est-mCnc: 194 mg/dL
Hgb A1c MFr Bld: 8.4 % — ABNORMAL HIGH (ref 4.8–5.6)

## 2021-12-17 LAB — GLUCOSE, CAPILLARY
Glucose-Capillary: 116 mg/dL — ABNORMAL HIGH (ref 70–99)
Glucose-Capillary: 95 mg/dL (ref 70–99)

## 2021-12-17 SURGERY — RELEASE, CUBITAL TUNNEL
Anesthesia: General | Site: Arm Upper | Laterality: Right

## 2021-12-17 MED ORDER — OXYCODONE HCL 5 MG PO TABS
ORAL_TABLET | ORAL | Status: AC
  Filled 2021-12-17: qty 1

## 2021-12-17 MED ORDER — PROPOFOL 500 MG/50ML IV EMUL
INTRAVENOUS | Status: DC | PRN
  Administered 2021-12-17: 25 ug/kg/min via INTRAVENOUS

## 2021-12-17 MED ORDER — FENTANYL CITRATE (PF) 100 MCG/2ML IJ SOLN
INTRAMUSCULAR | Status: AC
  Filled 2021-12-17: qty 2

## 2021-12-17 MED ORDER — SCOPOLAMINE 1 MG/3DAYS TD PT72
MEDICATED_PATCH | TRANSDERMAL | Status: AC
  Filled 2021-12-17: qty 1

## 2021-12-17 MED ORDER — ONDANSETRON HCL 4 MG PO TABS
4.0000 mg | ORAL_TABLET | Freq: Three times a day (TID) | ORAL | 0 refills | Status: DC | PRN
  Filled 2021-12-17: qty 20, 4d supply, fill #0

## 2021-12-17 MED ORDER — AMISULPRIDE (ANTIEMETIC) 5 MG/2ML IV SOLN: 10.0000 mg | Freq: Once | INTRAVENOUS | Status: DC | PRN

## 2021-12-17 MED ORDER — PROPOFOL 10 MG/ML IV BOLUS
INTRAVENOUS | Status: DC | PRN
  Administered 2021-12-17: 200 mg via INTRAVENOUS

## 2021-12-17 MED ORDER — LACTATED RINGERS IV SOLN
INTRAVENOUS | Status: DC
Start: 2021-12-17 — End: 2021-12-17

## 2021-12-17 MED ORDER — HYDROCODONE-ACETAMINOPHEN 5-325 MG PO TABS
1.0000 | ORAL_TABLET | Freq: Four times a day (QID) | ORAL | 0 refills | Status: DC | PRN
  Filled 2021-12-17: qty 20, 5d supply, fill #0

## 2021-12-17 MED ORDER — MIDAZOLAM HCL 2 MG/2ML IJ SOLN
INTRAMUSCULAR | Status: AC
  Filled 2021-12-17: qty 2

## 2021-12-17 MED ORDER — CEFAZOLIN SODIUM-DEXTROSE 2-4 GM/100ML-% IV SOLN
2.0000 g | INTRAVENOUS | Status: AC
  Administered 2021-12-17: 2 g via INTRAVENOUS

## 2021-12-17 MED ORDER — OXYCODONE HCL 5 MG PO TABS
5.0000 mg | ORAL_TABLET | Freq: Once | ORAL | Status: AC | PRN
  Administered 2021-12-17: 5 mg via ORAL

## 2021-12-17 MED ORDER — SCOPOLAMINE 1 MG/3DAYS TD PT72
1.0000 | MEDICATED_PATCH | TRANSDERMAL | Status: DC
  Administered 2021-12-17: 1.5 mg via TRANSDERMAL

## 2021-12-17 MED ORDER — OXYCODONE HCL 5 MG/5ML PO SOLN: 5.0000 mg | Freq: Once | ORAL | Status: AC | PRN

## 2021-12-17 MED ORDER — ONDANSETRON HCL 4 MG/2ML IJ SOLN
INTRAMUSCULAR | Status: DC | PRN
  Administered 2021-12-17: 4 mg via INTRAVENOUS

## 2021-12-17 MED ORDER — LIDOCAINE 2% (20 MG/ML) 5 ML SYRINGE
INTRAMUSCULAR | Status: DC | PRN
  Administered 2021-12-17: 60 mg via INTRAVENOUS

## 2021-12-17 MED ORDER — EPHEDRINE 5 MG/ML INJ
INTRAVENOUS | Status: AC
  Filled 2021-12-17: qty 5

## 2021-12-17 MED ORDER — CEFAZOLIN SODIUM-DEXTROSE 2-4 GM/100ML-% IV SOLN
INTRAVENOUS | Status: AC
  Filled 2021-12-17: qty 100

## 2021-12-17 MED ORDER — ACETAMINOPHEN 10 MG/ML IV SOLN: 1000.0000 mg | Freq: Once | INTRAVENOUS | Status: DC | PRN

## 2021-12-17 MED ORDER — PROMETHAZINE HCL 25 MG/ML IJ SOLN: 6.2500 mg | INTRAMUSCULAR | Status: DC | PRN

## 2021-12-17 MED ORDER — FENTANYL CITRATE (PF) 100 MCG/2ML IJ SOLN
25.0000 ug | INTRAMUSCULAR | Status: DC | PRN
  Administered 2021-12-17 (×3): 50 ug via INTRAVENOUS

## 2021-12-17 MED ORDER — KETOROLAC TROMETHAMINE 30 MG/ML IJ SOLN
INTRAMUSCULAR | Status: AC
  Filled 2021-12-17: qty 1

## 2021-12-17 MED ORDER — FENTANYL CITRATE (PF) 100 MCG/2ML IJ SOLN
INTRAMUSCULAR | Status: DC | PRN
  Administered 2021-12-17: 50 ug via INTRAVENOUS
  Administered 2021-12-17: 25 ug via INTRAVENOUS
  Administered 2021-12-17: 50 ug via INTRAVENOUS

## 2021-12-17 MED ORDER — DEXAMETHASONE SODIUM PHOSPHATE 10 MG/ML IJ SOLN
INTRAMUSCULAR | Status: DC | PRN
  Administered 2021-12-17: 80 mg via INTRAVENOUS

## 2021-12-17 MED ORDER — MIDAZOLAM HCL 5 MG/5ML IJ SOLN
INTRAMUSCULAR | Status: DC | PRN
  Administered 2021-12-17: 2 mg via INTRAVENOUS

## 2021-12-17 MED ORDER — BUPIVACAINE HCL (PF) 0.25 % IJ SOLN
INTRAMUSCULAR | Status: DC | PRN
  Administered 2021-12-17: 10 mL

## 2021-12-17 MED ORDER — KETOROLAC TROMETHAMINE 30 MG/ML IJ SOLN
30.0000 mg | Freq: Once | INTRAMUSCULAR | Status: AC
  Administered 2021-12-17: 30 mg via INTRAVENOUS

## 2021-12-17 SURGICAL SUPPLY — 59 items
BLADE SURG 15 STRL LF DISP TIS (BLADE) ×1 IMPLANT
BLADE SURG 15 STRL SS (BLADE) ×2
BNDG CMPR 9X4 STRL LF SNTH (GAUZE/BANDAGES/DRESSINGS) ×1
BNDG ELASTIC 3X5.8 VLCR STR LF (GAUZE/BANDAGES/DRESSINGS) ×2 IMPLANT
BNDG ESMARK 4X9 LF (GAUZE/BANDAGES/DRESSINGS) ×2 IMPLANT
BNDG GAUZE DERMACEA FLUFF (GAUZE/BANDAGES/DRESSINGS)
BNDG GAUZE DERMACEA FLUFF 4 (GAUZE/BANDAGES/DRESSINGS) IMPLANT
BNDG GZE DERMACEA 4 6PLY (GAUZE/BANDAGES/DRESSINGS)
BRUSH SCRUB EZ PLAIN DRY (MISCELLANEOUS) ×2 IMPLANT
CORD BIPOLAR FORCEPS 12FT (ELECTRODE) ×2 IMPLANT
COVER BACK TABLE 60X90IN (DRAPES) ×2 IMPLANT
COVER MAYO STAND STRL (DRAPES) ×2 IMPLANT
CUFF TOURN SGL QUICK 18X4 (TOURNIQUET CUFF) IMPLANT
DRAPE EXTREMITY T 121X128X90 (DISPOSABLE) ×2 IMPLANT
DRAPE SURG 17X23 STRL (DRAPES) ×2 IMPLANT
GAUZE SPONGE 4X4 12PLY STRL (GAUZE/BANDAGES/DRESSINGS) ×2 IMPLANT
GAUZE XEROFORM 1X8 LF (GAUZE/BANDAGES/DRESSINGS) ×2 IMPLANT
GLOVE ECLIPSE 7.0 STRL STRAW (GLOVE) ×2 IMPLANT
GLOVE INDICATOR 7.0 STRL GRN (GLOVE) ×2 IMPLANT
GLOVE INDICATOR 7.5 STRL GRN (GLOVE) ×2 IMPLANT
GLOVE SURG SYN 7.5  E (GLOVE) ×2
GLOVE SURG SYN 7.5 E (GLOVE) ×1 IMPLANT
GLOVE SURG SYN 7.5 PF PI (GLOVE) ×1 IMPLANT
GOWN STRL REIN XL XLG (GOWN DISPOSABLE) ×2 IMPLANT
GOWN STRL REUS W/ TWL LRG LVL3 (GOWN DISPOSABLE) ×1 IMPLANT
GOWN STRL REUS W/ TWL XL LVL3 (GOWN DISPOSABLE) ×1 IMPLANT
GOWN STRL REUS W/TWL LRG LVL3 (GOWN DISPOSABLE) ×2
GOWN STRL REUS W/TWL XL LVL3 (GOWN DISPOSABLE) ×2
LOOP VESSEL MAXI BLUE (MISCELLANEOUS) ×2 IMPLANT
NDL HYPO 25X1 1.5 SAFETY (NEEDLE) ×1 IMPLANT
NEEDLE HYPO 25X1 1.5 SAFETY (NEEDLE) ×2 IMPLANT
NS IRRIG 1000ML POUR BTL (IV SOLUTION) ×2 IMPLANT
PACK BASIN DAY SURGERY FS (CUSTOM PROCEDURE TRAY) ×2 IMPLANT
PAD CAST 3X4 CTTN HI CHSV (CAST SUPPLIES) ×1 IMPLANT
PADDING CAST ABS 4INX4YD NS (CAST SUPPLIES)
PADDING CAST ABS COTTON 4X4 ST (CAST SUPPLIES) IMPLANT
PADDING CAST COTTON 3X4 STRL (CAST SUPPLIES) ×2
SHEET MEDIUM DRAPE 40X70 STRL (DRAPES) ×2 IMPLANT
SLEEVE SCD COMPRESS KNEE MED (STOCKING) ×2 IMPLANT
SLING ARM FOAM STRAP LRG (SOFTGOODS) ×2 IMPLANT
SPIKE FLUID TRANSFER (MISCELLANEOUS) IMPLANT
STOCKINETTE 4X48 STRL (DRAPES) ×2 IMPLANT
STRIP CLOSURE SKIN 1/4X4 (GAUZE/BANDAGES/DRESSINGS) IMPLANT
SUCTION FRAZIER HANDLE 10FR (MISCELLANEOUS) ×2
SUCTION TUBE FRAZIER 10FR DISP (MISCELLANEOUS) ×1 IMPLANT
SUT ETHILON 3 0 PS 1 (SUTURE) ×3 IMPLANT
SUT ETHILON 4 0 PS 2 18 (SUTURE) ×2 IMPLANT
SUT VIC AB 2-0 CT1 27 (SUTURE)
SUT VIC AB 2-0 CT1 TAPERPNT 27 (SUTURE) IMPLANT
SUT VIC AB 3-0 SH 27 (SUTURE) ×4
SUT VIC AB 3-0 SH 27X BRD (SUTURE) ×1 IMPLANT
SUT VIC AB 4-0 P-3 18XBRD (SUTURE) IMPLANT
SUT VIC AB 4-0 P2 18 (SUTURE) IMPLANT
SUT VIC AB 4-0 P3 18 (SUTURE)
SYR BULB EAR ULCER 3OZ GRN STR (SYRINGE) ×2 IMPLANT
SYR CONTROL 10ML LL (SYRINGE) ×2 IMPLANT
TOWEL GREEN STERILE FF (TOWEL DISPOSABLE) ×4 IMPLANT
TRAY DSU PREP LF (CUSTOM PROCEDURE TRAY) ×2 IMPLANT
UNDERPAD 30X36 HEAVY ABSORB (UNDERPADS AND DIAPERS) ×2 IMPLANT

## 2021-12-17 NOTE — Anesthesia Preprocedure Evaluation (Addendum)
Anesthesia Evaluation  Patient identified by MRN, date of birth, ID band Patient awake    Reviewed: Allergy & Precautions, NPO status , Patient's Chart, lab work & pertinent test results  History of Anesthesia Complications (+) PONV and history of anesthetic complications  Airway Mallampati: II  TM Distance: >3 FB Neck ROM: Full    Dental no notable dental hx.    Pulmonary asthma ,    Pulmonary exam normal        Cardiovascular hypertension, Pt. on medications Normal cardiovascular exam     Neuro/Psych  Headaches, PSYCHIATRIC DISORDERS Anxiety Depression  Neuromuscular disease    GI/Hepatic negative GI ROS, Neg liver ROS,   Endo/Other  diabetes, Oral Hypoglycemic AgentsMorbid obesity  Renal/GU negative Renal ROS     Musculoskeletal  (+) Arthritis ,   Abdominal (+) + obese,   Peds  Hematology negative hematology ROS (+)   Anesthesia Other Findings right cubital tunnel syndrome  Reproductive/Obstetrics                            Anesthesia Physical Anesthesia Plan  ASA: 3  Anesthesia Plan: General   Post-op Pain Management:    Induction: Intravenous  PONV Risk Score and Plan: 4 or greater and Ondansetron, Dexamethasone, Midazolam, Treatment may vary due to age or medical condition, Scopolamine patch - Pre-op and Propofol infusion  Airway Management Planned: LMA  Additional Equipment:   Intra-op Plan:   Post-operative Plan: Extubation in OR  Informed Consent: I have reviewed the patients History and Physical, chart, labs and discussed the procedure including the risks, benefits and alternatives for the proposed anesthesia with the patient or authorized representative who has indicated his/her understanding and acceptance.     Dental advisory given and Interpreter used for interveiw  Plan Discussed with: CRNA  Anesthesia Plan Comments:       Anesthesia Quick  Evaluation

## 2021-12-17 NOTE — Discharge Instructions (Addendum)
Postoperative instructions:  Weightbearing instructions: as tolerated  Keep your dressing and/or splint clean and dry at all times.  You can remove your dressing on post-operative day #3 and change with a dry/sterile dressing or Band-Aids as needed thereafter.    Incision instructions:  Do not soak your incision for 3 weeks after surgery.  If the incision gets wet, pat dry and do not scrub the incision.  Pain control:  You have been given a prescription to be taken as directed for post-operative pain control.  In addition, elevate the operative extremity above the heart at all times to prevent swelling and throbbing pain.  Take over-the-counter Colace, '100mg'$  by mouth twice a day while taking narcotic pain medications to help prevent constipation.  Follow up appointments: 1) 14 days for suture removal and wound check. 2) Dr. Erlinda Hong as scheduled.   -------------------------------------------------------------------------------------------------------------  After Surgery Pain Control:  After your surgery, post-surgical discomfort or pain is likely. This discomfort can last several days to a few weeks. At certain times of the day your discomfort may be more intense.  Did you receive a nerve block?  A nerve block can provide pain relief for one hour to two days after your surgery. As long as the nerve block is working, you will experience little or no sensation in the area the surgeon operated on.  As the nerve block wears off, you will begin to experience pain or discomfort. It is very important that you begin taking your prescribed pain medication before the nerve block fully wears off. Treating your pain at the first sign of the block wearing off will ensure your pain is better controlled and more tolerable when full-sensation returns. Do not wait until the pain is intolerable, as the medicine will be less effective. It is better to treat pain in advance than to try and catch up.  General  Anesthesia:  If you did not receive a nerve block during your surgery, you will need to start taking your pain medication shortly after your surgery and should continue to do so as prescribed by your surgeon.  Pain Medication:  Most commonly we prescribe Vicodin and Percocet for post-operative pain. Both of these medications contain a combination of acetaminophen (Tylenol) and a narcotic to help control pain.   It takes between 30 and 45 minutes before pain medication starts to work. It is important to take your medication before your pain level gets too intense.   Nausea is a common side effect of many pain medications. You will want to eat something before taking your pain medicine to help prevent nausea.   If you are taking a prescription pain medication that contains acetaminophen, we recommend that you do not take additional over the counter acetaminophen (Tylenol).  Other pain relieving options:   Using a cold pack to ice the affected area a few times a day (15 to 20 minutes at a time) can help to relieve pain, reduce swelling and bruising.   Elevation of the affected area can also help to reduce pain and swelling.  No Ibuprofen until 9:15 today if needed  Post Anesthesia Home Care Instructions  Activity: Get plenty of rest for the remainder of the day. A responsible individual must stay with you for 24 hours following the procedure.  For the next 24 hours, DO NOT: -Drive a car -Paediatric nurse -Drink alcoholic beverages -Take any medication unless instructed by your physician -Make any legal decisions or sign important papers.  Meals: Start with  liquid foods such as gelatin or soup. Progress to regular foods as tolerated. Avoid greasy, spicy, heavy foods. If nausea and/or vomiting occur, drink only clear liquids until the nausea and/or vomiting subsides. Call your physician if vomiting continues.  Special Instructions/Symptoms: Your throat may feel dry or sore from the  anesthesia or the breathing tube placed in your throat during surgery. If this causes discomfort, gargle with warm salt water. The discomfort should disappear within 24 hours.  If you had a scopolamine patch placed behind your ear for the management of post- operative nausea and/or vomiting:  1. The medication in the patch is effective for 72 hours, after which it should be removed.  Wrap patch in a tissue and discard in the trash. Wash hands thoroughly with soap and water. 2. You may remove the patch earlier than 72 hours if you experience unpleasant side effects which may include dry mouth, dizziness or visual disturbances. 3. Avoid touching the patch. Wash your hands with soap and water after contact with the patch.

## 2021-12-17 NOTE — Transfer of Care (Signed)
Immediate Anesthesia Transfer of Care Note  Patient: Alexandra Norris  Procedure(s) Performed: RIGHT CUBITAL TUNNEL RELEASE (Right: Arm Upper)  Patient Location: PACU  Anesthesia Type:General  Level of Consciousness: awake  Airway & Oxygen Therapy: Patient Spontanous Breathing and Patient connected to face mask oxygen  Post-op Assessment: Report given to RN and Post -op Vital signs reviewed and stable  Post vital signs: Reviewed and stable  Last Vitals:  Vitals Value Taken Time  BP    Temp    Pulse    Resp    SpO2      Last Pain:  Vitals:   12/17/21 1257  TempSrc: Oral  PainSc: 0-No pain         Complications: No notable events documented.

## 2021-12-17 NOTE — Anesthesia Procedure Notes (Signed)
Procedure Name: LMA Insertion Date/Time: 12/17/2021 2:19 PM  Performed by: Tawni Millers, CRNAPre-anesthesia Checklist: Patient identified, Emergency Drugs available, Suction available and Patient being monitored Patient Re-evaluated:Patient Re-evaluated prior to induction Oxygen Delivery Method: Circle system utilized Preoxygenation: Pre-oxygenation with 100% oxygen Induction Type: IV induction Ventilation: Mask ventilation without difficulty LMA: LMA inserted LMA Size: 3.0 Number of attempts: 1 Airway Equipment and Method: Bite block Placement Confirmation: positive ETCO2 Tube secured with: Tape Dental Injury: Teeth and Oropharynx as per pre-operative assessment

## 2021-12-17 NOTE — H&P (Signed)
PREOPERATIVE H&P  Chief Complaint: right cubital tunnel syndrome  HPI: Alexandra Norris is a 46 y.o. female who presents for surgical treatment of right cubital tunnel syndrome.  She denies any changes in medical history.  Past Medical History:  Diagnosis Date   Anemia    Anemia    Anxiety    Arthritis    Asthma    Bilateral chronic knee pain 09/25/2015   Carpal tunnel syndrome    Depression    DEPRESSION, SITUATIONAL, PROLONGED 02/05/2009   Qualifier: Diagnosis of  By: Sherilyn Cooter  MD, Khary     Diabetes mellitus without complication ([place])    Dizziness 04/22/2021   Dyspnea    Elevated blood-pressure reading, without diagnosis of hypertension 06/17/2020   Endometriosis    Fatigue 04/22/2021   History of blood transfusion 1999   w/vaginal delivery   History of bronchitis    "I've stayed in hospital 2 X w/bronchitis"   Hyperlipidemia    Hypertension    Impingement syndrome of left shoulder    Influenza vaccine needed 04/03/2020   Left carpal tunnel syndrome 07/11/2019   Migraine    Neck pain 10/14/2020   Pneumonia    PONV (postoperative nausea and vomiting)    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   Pre-diabetes 10/03/2019   A1C 6.4   Right carpal tunnel syndrome 07/11/2019   S/P carpal tunnel release 08/30/2019   Seasonal allergies 10/08/2016   Shingles 11/14/2020   Past Surgical History:  Procedure Laterality Date   ABDOMINAL HYSTERECTOMY  10/17/2019   APPENDECTOMY     CARPAL TUNNEL RELEASE Right 07/19/2019   Procedure: RIGHT CARPAL TUNNEL RELEASE;  Surgeon: Leandrew Koyanagi, MD;  Location: [place];  Service: Orthopedics;  Laterality: Right;   CARPAL TUNNEL RELEASE Left 09/13/2019   Procedure: LEFT CARPAL TUNNEL RELEASE;  Surgeon: Leandrew Koyanagi, MD;  Location: [place];  Service: Orthopedics;  Laterality: Left;  Bier block   [place]; 2008   CHOLECYSTECTOMY  2011   SHOULDER ARTHROSCOPY WITH DISTAL CLAVICLE  RESECTION  02/12/2021   Procedure: SHOULDER ARTHROSCOPY WITH DISTAL CLAVICLE RESECTION;  Surgeon: Leandrew Koyanagi, MD;  Location: [place];  Service: Orthopedics;;   SHOULDER ARTHROSCOPY WITH ROTATOR CUFF REPAIR AND SUBACROMIAL DECOMPRESSION Left 02/12/2021   Procedure: LEFT SHOULDER ARTHROSCOPY WITH ROTATOR CUFF REPAIR, SUBACROMIAL DECOMPRESSION, EXTENSIVE DEBRIDEMENT;  Surgeon: Leandrew Koyanagi, MD;  Location: [place];  Service: Orthopedics;  Laterality: Left;   TONSILLECTOMY     tonsilletomy     TUBAL LIGATION  04/2007   VAGINAL HYSTERECTOMY Bilateral 10/17/2019   Procedure: HYSTERECTOMY VAGINAL WITH SALPINGECTOMY;  Surgeon: Chancy Milroy, MD;  Location: [facility];  Service: Gynecology;  Laterality: Bilateral;   Social History   Socioeconomic History   Marital status: Married    Spouse name: Not on file   Number of children: 3   Years of education: Not on file   Highest education level: 12th grade  Occupational History   Not on file  Tobacco Use   Smoking status: Never   Smokeless tobacco: Never  Vaping Use   Vaping Use: Never used  Substance and Sexual Activity   Alcohol use: No   Drug use: No   Sexual activity: Yes    Birth control/protection: Surgical  Other Topics Concern   Not on file  Social History Narrative   Not on file   Social Determinants of Health   Financial Resource Strain: Not  on file  Food Insecurity: Food Insecurity Present (11/21/2019)   Hunger Vital Sign    Worried About Running Out of Food in the Last Year: Sometimes true    Ran Out of Food in the Last Year: Sometimes true  Transportation Needs: No Transportation Needs (11/21/2019)   PRAPARE - Hydrologist (Medical): No    Lack of Transportation (Non-Medical): No  Physical Activity: Not on file  Stress: Not on file  Social Connections: Not on file   Family History  Problem Relation Age of Onset   Diabetes Mother    Osteoarthritis Mother     Hypertension Mother    Hyperlipidemia Mother    Arthritis Mother    No Known Allergies Prior to Admission medications   Medication Sig Start Date End Date Taking? Authorizing Provider  albuterol (VENTOLIN HFA) 108 (90 Base) MCG/ACT inhaler Inhale 2 puffs into the lungs in the morning and at bedtime. 09/25/21  Yes Early, Coralee Pesa, NP  atorvastatin (LIPITOR) 20 MG tablet Take 1 tablet (20 mg total) by mouth at bedtime. 09/15/21  Yes Early, Coralee Pesa, NP  busPIRone (BUSPAR) 5 MG tablet Take 1 tablet (5 mg total) by mouth 3 (three) times daily as needed. Patient not taking: Reported on 12/16/2021 09/15/21  Yes Early, Coralee Pesa, NP  Dulaglutide (TRULICITY) 3 BP/1.0CH SOPN Inject 3 mg as directed once a week. 09/15/21  Yes Early, Coralee Pesa, NP  fluticasone (FLONASE) 50 MCG/ACT nasal spray Place 2 sprays into both nostrils daily for allergies. 09/25/21  Yes Early, Coralee Pesa, NP  hydrOXYzine (ATARAX) 50 MG tablet Take 1 tablet (50 mg total) by mouth at bedtime and may repeat dose one time if needed for anxiety and sleep. 09/15/21  Yes Early, Coralee Pesa, NP  lisinopril (ZESTRIL) 10 MG tablet Take 1 tablet (10 mg total) by mouth daily. 09/15/21  Yes Early, Coralee Pesa, NP  metFORMIN (GLUCOPHAGE) 500 MG tablet Take 1 tablet (500 mg total) by mouth daily with breakfast AND 2 tablets (1,000 mg total) with dinner 09/15/21  Yes Early, Coralee Pesa, NP  Multiple Vitamin (MULTIVITAMIN WITH MINERALS) TABS tablet Take 1 tablet by mouth daily in the afternoon.   Yes [provider]  sertraline (ZOLOFT) 50 MG tablet Take 1 tablet (50 mg total) by mouth daily. 09/15/21  Yes Early, Coralee Pesa, NP  COVID-19 mRNA bivalent vaccine, Pfizer, (PFIZER COVID-19 VAC BIVALENT) injection Inject into the muscle. Patient not taking: Reported on 12/16/2021 04/22/21   Carlyle Basques, MD  diclofenac (VOLTAREN) 50 MG EC tablet Take 1 tablet (50 mg total) by mouth 2 (two) times daily. Patient not taking: Reported on 12/16/2021 11/21/21   Jessy Oto, MD  gabapentin  (NEURONTIN) 100 MG capsule Take 1 capsule (100 mg total) by mouth at bedtime. 11/21/21   Jessy Oto, MD  Spacer/Aero-Holding Chambers (AEROCHAMBER PLUS FLO-VU LARGE) MISC See admin instructions. 07/14/21   [provider]     Positive ROS: All other systems have been reviewed and were otherwise negative with the exception of those mentioned in the HPI and as above.  Physical Exam: General: Alert, no acute distress Cardiovascular: No pedal edema Respiratory: No cyanosis, no use of accessory musculature GI: abdomen soft Skin: No lesions in the area of chief complaint Neurologic: Sensation intact distally Psychiatric: Patient is competent for consent with normal mood and affect Lymphatic: no lymphedema  MUSCULOSKELETAL: exam stable  Assessment: right cubital tunnel syndrome  Plan: Plan for Procedure(s): RIGHT CUBITAL  TUNNEL RELEASE  The risks benefits and alternatives were discussed with the patient including but not limited to the risks of nonoperative treatment, versus surgical intervention including infection, bleeding, nerve injury,  blood clots, cardiopulmonary complications, morbidity, mortality, among others, and they were willing to proceed.   Eduard Roux, MD 12/17/2021 1:36 PM

## 2021-12-17 NOTE — Op Note (Signed)
   DATE OF SURGERY: 12/17/2021  PREOPERATIVE DIAGNOSIS: Right cubital tunnel syndrome  POSTOPERATIVE DIAGNOSIS: same.  PROCEDURE: Right ulnar nerve neurolysis at elbow.  CPT [phone]  SURGEON: Surgeon(s) and Role:    * Leandrew Koyanagi, MD - Primary  ASSIST: Madalyn Rob, PA-C; necessary for the timely completion of procedure and due to complexity of procedure.  ANESTHESIA: General  TOURNIQUET TIME: see record   BLOOD LOSS: Minimal.  COMPLICATIONS: None.  PATHOLOGY: None.  TIME OUT: Performed prior to start of procedure.  INDICATIONS: The patient was a 46 y.o. female who presented with cubital tunnel syndrome failing nonsurgical managements, indicated for surgery.  DESCRIPTION OF PROCEDURE: The patient was identified in the preoperative holding area.  The operative site was marked by the surgeon and confirmed by the patient.  He was brought back to the operating room.  Anesthesia was induced by the anesthesia team.  The operative extremity was prepped and draped in standard sterile fashion.  A well padded sterile tourniquet was placed.   A medial elbow incision was made in between the medial epicondyle and olecranon. The medial antebrachial cutaneous nerve was exposed and retracted and protected. The ulnar nerve was identified in between the medial intermuscular septum and medial head of triceps. The fascia crossing the medial head of the triceps and intermuscular septum was released about 10 cm proximally to the elbow. Distally, Osborne's ligament was released from its posterior edge. We followed the ulnar nerve distally. The fascia of the flexor carpi ulnaris was divided and deep aponeurosis was also released. Passive motion of the elbow was performed showing no ulnar nerve subluxation. At this point, local infiltration with 0.25% of Sensorcaine was given. The tourniquet was deflated. Hemostasis was achieved. The wound was irrigated and closed using 3-0 nylon sutures. Sterile  dressing applied. The patient was transferred to the recovery room in stable condition after all counts were correct.  POSTOPERATIVE PLAN: To start nerve gliding exercises, avoid heavy lifting for four weeks.

## 2021-12-18 ENCOUNTER — Encounter (HOSPITAL_BASED_OUTPATIENT_CLINIC_OR_DEPARTMENT_OTHER): Payer: Self-pay | Admitting: Orthopaedic Surgery

## 2021-12-18 NOTE — Anesthesia Postprocedure Evaluation (Signed)
Anesthesia Post Note  Patient: Alexandra Norris  Procedure(s) Performed: RIGHT CUBITAL TUNNEL RELEASE (Right: Arm Upper)     Patient location during evaluation: PACU Anesthesia Type: General Level of consciousness: awake Pain management: pain level controlled Vital Signs Assessment: post-procedure vital signs reviewed and stable Respiratory status: spontaneous breathing, nonlabored ventilation, respiratory function stable and patient connected to nasal cannula oxygen Cardiovascular status: blood pressure returned to baseline and stable Postop Assessment: no apparent nausea or vomiting Anesthetic complications: no   No notable events documented.  Last Vitals:  Vitals:   12/17/21 1532 12/17/21 1552  BP: 108/86 120/80  Pulse: 85 85  Resp: 13 14  Temp:  36.6 C  SpO2: 93% 95%    Last Pain:  Vitals:   12/17/21 1605  TempSrc:   PainSc: 3                  [name]

## 2021-12-24 ENCOUNTER — Ambulatory Visit (INDEPENDENT_AMBULATORY_CARE_PROVIDER_SITE_OTHER): Payer: BC Managed Care – PPO | Admitting: Physician Assistant

## 2021-12-24 DIAGNOSIS — G5621 Lesion of ulnar nerve, right upper limb: Secondary | ICD-10-CM

## 2021-12-24 NOTE — Progress Notes (Signed)
Post-Op Visit Note   Patient: Alexandra Norris           Date of Birth: 08/31/1975           MRN: [id number] Visit Date: 12/24/2021 PCP: Orma Render, NP   Assessment & Plan:  Chief Complaint:  Chief Complaint  Patient presents with   Right Elbow - Routine Post Op   Visit Diagnoses:  1. Cubital tunnel syndrome on right     Plan: Patient is a pleasant 46 year old Spanish-speaking female who is here today with interpreter.  She is 1 week status post right cubital tunnel release 12/17/2021.  She denies any pain or paresthesias.  Overall doing great.  Examination of her right elbow reveals a fully healed surgical incision with nylon sutures in place.  She has slight erythema but no signs of infection or cellulitis.  Fingers warm well perfused.  She is neurovascular tact distally.  Today, her incision was cleaned and recovered.  No heavy lifting to the right upper extremity.  She will follow-up next week for suture removal.  Call with concerns or questions in the meantime.  Follow-Up Instructions: Return in about 1 week (around 12/31/2021).   Orders:  No orders of the defined types were placed in this encounter.  No orders of the defined types were placed in this encounter.   Imaging: No new imaging  PMFS History: Patient Active Problem List   Diagnosis Date Noted   Cubital tunnel syndrome on right 12/02/2021   Sinusitis, bacterial 10/07/2021   Asthma due to seasonal allergies 09/25/2021   Cough 07/15/2021   S/P arthroscopy of right shoulder 07/08/2021   Type 1 superior labral anterior-to-posterior (SLAP) tear of right shoulder 04/24/2021   Arthritis of right acromioclavicular joint 04/24/2021   Chronic left shoulder pain 04/22/2021   Hypertension associated with diabetes ([place]) 04/22/2021   Hyperlipidemia associated with type 2 diabetes mellitus ([place]) 04/22/2021   Nontraumatic tear of left supraspinatus tendon 02/12/2021   Encounter to establish care 09/09/2020    Fatty liver disease, nonalcoholic 67/05/4579   Cervical disc disorder at C5-C6 level with radiculopathy 04/03/2020   Arthritis    Nasal obstruction 02/18/2016   DM2 (diabetes mellitus, type 2) ([place]) 10/21/2015   Vitamin D deficiency 09/25/2015   Anxiety and depression 07/06/2014   GASTROESOPHAGEAL REFLUX DISEASE 08/14/2009   Body mass index (BMI) 45.0-49.9, adult ([place]) 05/30/2007   Moderate persistent asthma with acute exacerbation 12/06/2006   Past Medical History:  Diagnosis Date   Anemia    Anemia    Anxiety    Arthritis    Asthma    Bilateral chronic knee pain 09/25/2015   Carpal tunnel syndrome    Depression    DEPRESSION, SITUATIONAL, PROLONGED 02/05/2009   Qualifier: Diagnosis of  By: Sherilyn Cooter  MD, Khary     Diabetes mellitus without complication ([place])    Dizziness 04/22/2021   Dyspnea    Elevated blood-pressure reading, without diagnosis of hypertension 06/17/2020   Endometriosis    Fatigue 04/22/2021   History of blood transfusion 1999   w/vaginal delivery   History of bronchitis    "I've stayed in hospital 2 X w/bronchitis"   Hyperlipidemia    Hypertension    Impingement syndrome of left shoulder    Influenza vaccine needed 04/03/2020   Left carpal tunnel syndrome 07/11/2019   Migraine    Neck pain 10/14/2020   Pneumonia    PONV (postoperative nausea and vomiting)    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  Pre-diabetes 10/03/2019   A1C 6.4   Right carpal tunnel syndrome 07/11/2019   S/P carpal tunnel release 08/30/2019   Seasonal allergies 10/08/2016   Shingles 11/14/2020    Family History  Problem Relation Age of Onset   Diabetes Mother    Osteoarthritis Mother    Hypertension Mother    Hyperlipidemia Mother    Arthritis Mother     Past Surgical History:  Procedure Laterality Date   ABDOMINAL HYSTERECTOMY  10/17/2019   APPENDECTOMY     CARPAL TUNNEL RELEASE Right 07/19/2019   Procedure: RIGHT CARPAL TUNNEL RELEASE;  Surgeon: Leandrew Koyanagi, MD;   Location: [facility];  Service: Orthopedics;  Laterality: Right;   CARPAL TUNNEL RELEASE Left 09/13/2019   Procedure: LEFT CARPAL TUNNEL RELEASE;  Surgeon: Leandrew Koyanagi, MD;  Location: [facility];  Service: Orthopedics;  Laterality: Left;  Bier block   [place]; 2008   CHOLECYSTECTOMY  2011   SHOULDER ARTHROSCOPY WITH DISTAL CLAVICLE RESECTION  02/12/2021   Procedure: SHOULDER ARTHROSCOPY WITH DISTAL CLAVICLE RESECTION;  Surgeon: Leandrew Koyanagi, MD;  Location: [facility];  Service: Orthopedics;;   SHOULDER ARTHROSCOPY WITH ROTATOR CUFF REPAIR AND SUBACROMIAL DECOMPRESSION Left 02/12/2021   Procedure: LEFT SHOULDER ARTHROSCOPY WITH ROTATOR CUFF REPAIR, SUBACROMIAL DECOMPRESSION, EXTENSIVE DEBRIDEMENT;  Surgeon: Leandrew Koyanagi, MD;  Location: [facility];  Service: Orthopedics;  Laterality: Left;   TONSILLECTOMY     tonsilletomy     TUBAL LIGATION  04/2007   ULNAR TUNNEL RELEASE Right 12/17/2021   Procedure: RIGHT CUBITAL TUNNEL RELEASE;  Surgeon: Leandrew Koyanagi, MD;  Location: [facility];  Service: Orthopedics;  Laterality: Right;   VAGINAL HYSTERECTOMY Bilateral 10/17/2019   Procedure: HYSTERECTOMY VAGINAL WITH SALPINGECTOMY;  Surgeon: Chancy Milroy, MD;  Location: [facility];  Service: Gynecology;  Laterality: Bilateral;   Social History   Occupational History   Not on file  Tobacco Use   Smoking status: Never   Smokeless tobacco: Never  Vaping Use   Vaping Use: Never used  Substance and Sexual Activity   Alcohol use: No   Drug use: No   Sexual activity: Yes    Birth control/protection: Surgical

## 2021-12-25 ENCOUNTER — Ambulatory Visit: Payer: BC Managed Care – PPO | Admitting: Specialist

## 2021-12-30 DIAGNOSIS — M7541 Impingement syndrome of right shoulder: Secondary | ICD-10-CM

## 2021-12-31 ENCOUNTER — Ambulatory Visit (INDEPENDENT_AMBULATORY_CARE_PROVIDER_SITE_OTHER): Payer: BC Managed Care – PPO | Admitting: Physician Assistant

## 2021-12-31 ENCOUNTER — Encounter: Payer: Self-pay | Admitting: Orthopaedic Surgery

## 2021-12-31 DIAGNOSIS — G5621 Lesion of ulnar nerve, right upper limb: Secondary | ICD-10-CM

## 2021-12-31 NOTE — Progress Notes (Signed)
Post-Op Visit Note   Patient: Alexandra Norris           Date of Birth: [birth year]           MRN: [id number] Visit Date: 12/31/2021 PCP: Orma Render, NP   Assessment & Plan:  Chief Complaint:  Chief Complaint  Patient presents with   Right Elbow - Follow-up    Cubital tunnel release 12/17/2021   Visit Diagnoses:  1. Cubital tunnel syndrome on right     Plan: Patient is a pleasant 46 year old female who comes in today 2 weeks status post right cubital tunnel release 12/17/2021.  She is doing well.  She denies any pain or paresthesias.  Examination of the right elbow reveals a fully healed surgical scar with nylon sutures in place.  No evidence of infection or cellulitis.  She is neurovascular intact distally.  Today, sutures were removed and Steri-Strips applied.  No heavy lifting for another few weeks.  Follow-up in 4 weeks for recheck.  Call with concerns or questions.  Follow-Up Instructions: Return in about 4 weeks (around 01/28/2022).   Orders:  No orders of the defined types were placed in this encounter.  No orders of the defined types were placed in this encounter.   Imaging: No results found.  PMFS History: Patient Active Problem List   Diagnosis Date Noted   Impingement syndrome of right shoulder 12/30/2021   Cubital tunnel syndrome on right 12/02/2021   Sinusitis, bacterial 10/07/2021   Asthma due to seasonal allergies 09/25/2021   Cough 07/15/2021   S/P arthroscopy of right shoulder 07/08/2021   Type 1 superior labral anterior-to-posterior (SLAP) tear of right shoulder 04/24/2021   Arthritis of right acromioclavicular joint 04/24/2021   Chronic left shoulder pain 04/22/2021   Hypertension associated with diabetes ([place]) 04/22/2021   Hyperlipidemia associated with type 2 diabetes mellitus ([place]) 04/22/2021   Nontraumatic tear of left supraspinatus tendon 02/12/2021   Encounter to establish care 09/09/2020   Fatty liver disease, nonalcoholic  82/99/3716   Cervical disc disorder at C5-C6 level with radiculopathy 04/03/2020   Arthritis    Nasal obstruction 02/18/2016   DM2 (diabetes mellitus, type 2) ([place]) 10/21/2015   Vitamin D deficiency 09/25/2015   Anxiety and depression 07/06/2014   GASTROESOPHAGEAL REFLUX DISEASE 08/14/2009   Body mass index (BMI) 45.0-49.9, adult ([place]) 05/30/2007   Moderate persistent asthma with acute exacerbation 12/06/2006   Past Medical History:  Diagnosis Date   Anemia    Anemia    Anxiety    Arthritis    Asthma    Bilateral chronic knee pain 09/25/2015   Carpal tunnel syndrome    Depression    DEPRESSION, SITUATIONAL, PROLONGED 02/05/2009   Qualifier: Diagnosis of  By: Sherilyn Cooter  MD, Khary     Diabetes mellitus without complication ([place])    Dizziness 04/22/2021   Dyspnea    Elevated blood-pressure reading, without diagnosis of hypertension 06/17/2020   Endometriosis    Fatigue 04/22/2021   History of blood transfusion 1999   w/vaginal delivery   History of bronchitis    "I've stayed in hospital 2 X w/bronchitis"   Hyperlipidemia    Hypertension    Impingement syndrome of left shoulder    Influenza vaccine needed 04/03/2020   Left carpal tunnel syndrome 07/11/2019   Migraine    Neck pain 10/14/2020   Pneumonia    PONV (postoperative nausea and vomiting)    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   Pre-diabetes 10/03/2019   A1C 6.4  Right carpal tunnel syndrome 07/11/2019   S/P carpal tunnel release 08/30/2019   Seasonal allergies 10/08/2016   Shingles 11/14/2020    Family History  Problem Relation Age of Onset   Diabetes Mother    Osteoarthritis Mother    Hypertension Mother    Hyperlipidemia Mother    Arthritis Mother     Past Surgical History:  Procedure Laterality Date   ABDOMINAL HYSTERECTOMY  10/17/2019   APPENDECTOMY     CARPAL TUNNEL RELEASE Right 07/19/2019   Procedure: RIGHT CARPAL TUNNEL RELEASE;  Surgeon: Leandrew Koyanagi, MD;  Location: [facility];   Service: Orthopedics;  Laterality: Right;   CARPAL TUNNEL RELEASE Left 09/13/2019   Procedure: LEFT CARPAL TUNNEL RELEASE;  Surgeon: Leandrew Koyanagi, MD;  Location: [facility];  Service: Orthopedics;  Laterality: Left;  Bier block   [place]; 2008   CHOLECYSTECTOMY  2011   SHOULDER ARTHROSCOPY WITH DISTAL CLAVICLE RESECTION  02/12/2021   Procedure: SHOULDER ARTHROSCOPY WITH DISTAL CLAVICLE RESECTION;  Surgeon: Leandrew Koyanagi, MD;  Location: [facility];  Service: Orthopedics;;   SHOULDER ARTHROSCOPY WITH ROTATOR CUFF REPAIR AND SUBACROMIAL DECOMPRESSION Left 02/12/2021   Procedure: LEFT SHOULDER ARTHROSCOPY WITH ROTATOR CUFF REPAIR, SUBACROMIAL DECOMPRESSION, EXTENSIVE DEBRIDEMENT;  Surgeon: Leandrew Koyanagi, MD;  Location: [facility];  Service: Orthopedics;  Laterality: Left;   TONSILLECTOMY     tonsilletomy     TUBAL LIGATION  04/2007   ULNAR TUNNEL RELEASE Right 12/17/2021   Procedure: RIGHT CUBITAL TUNNEL RELEASE;  Surgeon: Leandrew Koyanagi, MD;  Location: [facility];  Service: Orthopedics;  Laterality: Right;   VAGINAL HYSTERECTOMY Bilateral 10/17/2019   Procedure: HYSTERECTOMY VAGINAL WITH SALPINGECTOMY;  Surgeon: Chancy Milroy, MD;  Location: [facility];  Service: Gynecology;  Laterality: Bilateral;   Social History   Occupational History   Not on file  Tobacco Use   Smoking status: Never   Smokeless tobacco: Never  Vaping Use   Vaping Use: Never used  Substance and Sexual Activity   Alcohol use: No   Drug use: No   Sexual activity: Yes    Birth control/protection: Surgical

## 2022-01-16 ENCOUNTER — Encounter: Payer: Self-pay | Admitting: Specialist

## 2022-01-16 ENCOUNTER — Other Ambulatory Visit: Payer: Self-pay | Admitting: Radiology

## 2022-01-16 ENCOUNTER — Ambulatory Visit (INDEPENDENT_AMBULATORY_CARE_PROVIDER_SITE_OTHER): Payer: BC Managed Care – PPO | Admitting: Specialist

## 2022-01-16 VITALS — BP 111/65 | HR 95 | Ht 61.0 in | Wt 238.0 lb

## 2022-01-16 DIAGNOSIS — G8929 Other chronic pain: Secondary | ICD-10-CM

## 2022-01-16 DIAGNOSIS — M542 Cervicalgia: Secondary | ICD-10-CM

## 2022-01-16 DIAGNOSIS — M25512 Pain in left shoulder: Secondary | ICD-10-CM

## 2022-01-16 DIAGNOSIS — M47812 Spondylosis without myelopathy or radiculopathy, cervical region: Secondary | ICD-10-CM

## 2022-01-16 DIAGNOSIS — R200 Anesthesia of skin: Secondary | ICD-10-CM

## 2022-01-16 DIAGNOSIS — R202 Paresthesia of skin: Secondary | ICD-10-CM

## 2022-01-20 ENCOUNTER — Encounter (HOSPITAL_BASED_OUTPATIENT_CLINIC_OR_DEPARTMENT_OTHER): Payer: Self-pay | Admitting: Nurse Practitioner

## 2022-01-20 DIAGNOSIS — R0683 Snoring: Secondary | ICD-10-CM

## 2022-01-20 HISTORY — DX: Snoring: R06.83

## 2022-01-20 NOTE — Assessment & Plan Note (Signed)
Chronic.  Blood pressures are well controlled at this time.  She is not having any alarm symptoms.  We will plan to continue current regimen.  Diet and exercise encouraged.  Patient praised for all of her efforts and successes.  We will obtain labs today.  Plan follow-up in 3 months or sooner if needed.

## 2022-01-20 NOTE — Assessment & Plan Note (Signed)
Chronic.  She continues to work on diet and exercise to help manage.  No changes to plan of care at this time.

## 2022-01-20 NOTE — Assessment & Plan Note (Signed)
Significant improvement of anxiety and depression with sertraline.  I am very pleased with patient's response to the medication.  Recommend continued use of daily sertraline and as needed BuSpar for her symptoms.  Discussed with patient that if she feels the medication is no longer effective or if her symptoms begin to get worse to please let me know immediately we can make any changes that are needed.  No alarm symptoms present at this time.  We will plan to continue to monitor routinely.

## 2022-01-20 NOTE — Assessment & Plan Note (Signed)
Chronic.  She is working on diet, exercise, and weight loss.  I am hopeful that her efforts will significantly improve her condition.  She is not having any symptoms at this time.  Encourage patient to continue with diet, exercise, and medication for management.

## 2022-01-20 NOTE — Assessment & Plan Note (Signed)
Chronic.  Continue with diet and exercise as well as GLP-1 medication for diabetes control and comorbid conditions.

## 2022-01-20 NOTE — Assessment & Plan Note (Signed)
Chronic.  Patient has been having excellent blood sugar readings we will typical fasting readings under 120.  This is a significant improvement from her previous readings and she is really doing a great job.  She is not having any side effects from her medication.  At this time we will plan to continue her current medication and doses.  Recommend continued blood glucose monitoring, dietary changes, and exercise.  We will plan to follow-up in 3 months or sooner.

## 2022-01-20 NOTE — Assessment & Plan Note (Signed)
Partner reported snoring with episodes of apnea multiple times throughout the night and episodes of gasping.  Patient is also experiencing some daytime fatigue.  Based on her current symptoms I do feel that a sleep study would be appropriate.  She is currently working on diet and exercise for weight loss howeve this is not a rapid process and management may be needed while she continues to work.  We will plan to follow-up if symptoms we will send referral for sleep study today.

## 2022-01-26 ENCOUNTER — Ambulatory Visit
Admission: RE | Admit: 2022-01-26 | Discharge: 2022-01-26 | Disposition: A | Discharge status: Home / Self Care | Payer: BC Managed Care – PPO | Source: Ambulatory Visit | Attending: Specialist | Admitting: Specialist

## 2022-01-26 DIAGNOSIS — M542 Cervicalgia: Secondary | ICD-10-CM

## 2022-01-27 ENCOUNTER — Other Ambulatory Visit (HOSPITAL_BASED_OUTPATIENT_CLINIC_OR_DEPARTMENT_OTHER): Payer: Self-pay

## 2022-01-27 ENCOUNTER — Ambulatory Visit (HOSPITAL_BASED_OUTPATIENT_CLINIC_OR_DEPARTMENT_OTHER): Payer: BC Managed Care – PPO | Admitting: Nurse Practitioner

## 2022-01-27 ENCOUNTER — Encounter (HOSPITAL_BASED_OUTPATIENT_CLINIC_OR_DEPARTMENT_OTHER): Payer: Self-pay | Admitting: Nurse Practitioner

## 2022-01-27 VITALS — BP 128/88 | HR 75 | Ht 61.0 in | Wt 238.0 lb

## 2022-01-27 DIAGNOSIS — B379 Candidiasis, unspecified: Secondary | ICD-10-CM

## 2022-01-27 MED ORDER — FLUCONAZOLE 150 MG PO TABS
150.0000 mg | ORAL_TABLET | Freq: Once | ORAL | 0 refills | Status: AC
  Filled 2022-01-27: qty 2, 3d supply, fill #0

## 2022-01-28 ENCOUNTER — Ambulatory Visit (INDEPENDENT_AMBULATORY_CARE_PROVIDER_SITE_OTHER): Payer: BC Managed Care – PPO | Admitting: Orthopaedic Surgery

## 2022-01-28 ENCOUNTER — Encounter: Payer: Self-pay | Admitting: Orthopaedic Surgery

## 2022-01-28 DIAGNOSIS — G5621 Lesion of ulnar nerve, right upper limb: Secondary | ICD-10-CM

## 2022-01-28 NOTE — Progress Notes (Signed)
Post-Op Visit Note   Patient: Alexandra Norris           Date of Birth: 1975-12-16           MRN: [id number] Visit Date: 01/28/2022 PCP: Orma Render, NP   Assessment & Plan:  Chief Complaint:  Chief Complaint  Patient presents with   Right Elbow - Follow-up    Cubital tunnel release 12/17/2021   Visit Diagnoses:  1. Cubital tunnel syndrome on right     Plan: Patient is 6 weeks status post right cubital tunnel release.  Reports that she is doing well other than feeling some itchiness and some slight burning to the surgical area.  Examination of right elbow shows full range of motion.  No cubital tunnel symptoms.  Scar is fully healed and nontender.  Patient has done very well from the surgery.  Reassurance was provided that everything looks good.  I would expect her symptoms to fully resolve over the next couple months.  We will see her back as needed.  Follow-Up Instructions: No follow-ups on file.   Orders:  No orders of the defined types were placed in this encounter.  No orders of the defined types were placed in this encounter.   Imaging: No results found.  PMFS History: Patient Active Problem List   Diagnosis Date Noted   Snoring 01/20/2022   Impingement syndrome of right shoulder 12/30/2021   Cubital tunnel syndrome on right 12/02/2021   Asthma due to seasonal allergies 09/25/2021   Cough 07/15/2021   S/P arthroscopy of right shoulder 07/08/2021   Type 1 superior labral anterior-to-posterior (SLAP) tear of right shoulder 04/24/2021   Arthritis of right acromioclavicular joint 04/24/2021   Chronic left shoulder pain 04/22/2021   Hypertension associated with diabetes ([place]) 04/22/2021   Hyperlipidemia associated with type 2 diabetes mellitus ([place]) 04/22/2021   Nontraumatic tear of left supraspinatus tendon 02/12/2021   Encounter to establish care 09/09/2020   Fatty liver disease, nonalcoholic 35/00/9381   Cervical disc disorder at C5-C6 level  with radiculopathy 04/03/2020   Arthritis    Nasal obstruction 02/18/2016   DM2 (diabetes mellitus, type 2) ([place]) 10/21/2015   Vitamin D deficiency 09/25/2015   Anxiety and depression 07/06/2014   GASTROESOPHAGEAL REFLUX DISEASE 08/14/2009   Body mass index (BMI) 45.0-49.9, adult ([place]) 05/30/2007   Past Medical History:  Diagnosis Date   Anemia    Anemia    Anxiety    Arthritis    Asthma    Bilateral chronic knee pain 09/25/2015   Carpal tunnel syndrome    Depression    DEPRESSION, SITUATIONAL, PROLONGED 02/05/2009   Qualifier: Diagnosis of  By: Sherilyn Cooter  MD, Khary     Diabetes mellitus without complication ([place])    Dizziness 04/22/2021   Dyspnea    Elevated blood-pressure reading, without diagnosis of hypertension 06/17/2020   Endometriosis    Fatigue 04/22/2021   History of blood transfusion 1999   w/vaginal delivery   History of bronchitis    "I've stayed in hospital 2 X w/bronchitis"   Hyperlipidemia    Hypertension    Impingement syndrome of left shoulder    Influenza vaccine needed 04/03/2020   Left carpal tunnel syndrome 07/11/2019   Migraine    Moderate persistent asthma with acute exacerbation 12/06/2006   Qualifier: Diagnosis of  By: Sherilyn Cooter  MD, Khary     Neck pain 10/14/2020   Pneumonia    PONV (postoperative nausea and vomiting)    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  Pre-diabetes 10/03/2019   A1C 6.4   Right carpal tunnel syndrome 07/11/2019   S/P carpal tunnel release 08/30/2019   Seasonal allergies 10/08/2016   Shingles 11/14/2020   Sinusitis, bacterial 10/07/2021    Family History  Problem Relation Age of Onset   Diabetes Mother    Osteoarthritis Mother    Hypertension Mother    Hyperlipidemia Mother    Arthritis Mother     Past Surgical History:  Procedure Laterality Date   ABDOMINAL HYSTERECTOMY  10/17/2019   APPENDECTOMY     CARPAL TUNNEL RELEASE Right 07/19/2019   Procedure: RIGHT CARPAL TUNNEL RELEASE;  Surgeon: Leandrew Koyanagi, MD;   Location: [facility];  Service: Orthopedics;  Laterality: Right;   CARPAL TUNNEL RELEASE Left 09/13/2019   Procedure: LEFT CARPAL TUNNEL RELEASE;  Surgeon: Leandrew Koyanagi, MD;  Location: [facility];  Service: Orthopedics;  Laterality: Left;  Bier block   [place]; 2008   CHOLECYSTECTOMY  2011   SHOULDER ARTHROSCOPY WITH DISTAL CLAVICLE RESECTION  02/12/2021   Procedure: SHOULDER ARTHROSCOPY WITH DISTAL CLAVICLE RESECTION;  Surgeon: Leandrew Koyanagi, MD;  Location: [facility];  Service: Orthopedics;;   SHOULDER ARTHROSCOPY WITH ROTATOR CUFF REPAIR AND SUBACROMIAL DECOMPRESSION Left 02/12/2021   Procedure: LEFT SHOULDER ARTHROSCOPY WITH ROTATOR CUFF REPAIR, SUBACROMIAL DECOMPRESSION, EXTENSIVE DEBRIDEMENT;  Surgeon: Leandrew Koyanagi, MD;  Location: [facility];  Service: Orthopedics;  Laterality: Left;   TONSILLECTOMY     tonsilletomy     TUBAL LIGATION  04/2007   ULNAR TUNNEL RELEASE Right 12/17/2021   Procedure: RIGHT CUBITAL TUNNEL RELEASE;  Surgeon: Leandrew Koyanagi, MD;  Location: [facility];  Service: Orthopedics;  Laterality: Right;   VAGINAL HYSTERECTOMY Bilateral 10/17/2019   Procedure: HYSTERECTOMY VAGINAL WITH SALPINGECTOMY;  Surgeon: Chancy Milroy, MD;  Location: [facility];  Service: Gynecology;  Laterality: Bilateral;   Social History   Occupational History   Not on file  Tobacco Use   Smoking status: Never   Smokeless tobacco: Never  Vaping Use   Vaping Use: Never used  Substance and Sexual Activity   Alcohol use: No   Drug use: No   Sexual activity: Yes    Birth control/protection: Surgical

## 2022-02-06 ENCOUNTER — Ambulatory Visit: Payer: BC Managed Care – PPO | Admitting: Specialist

## 2022-02-12 ENCOUNTER — Ambulatory Visit (INDEPENDENT_AMBULATORY_CARE_PROVIDER_SITE_OTHER): Payer: BC Managed Care – PPO | Admitting: Neurology

## 2022-02-12 ENCOUNTER — Encounter: Payer: Self-pay | Admitting: Neurology

## 2022-02-12 VITALS — BP 110/68 | HR 77 | Ht 61.0 in | Wt 234.0 lb

## 2022-02-12 DIAGNOSIS — G4700 Insomnia, unspecified: Secondary | ICD-10-CM

## 2022-02-12 DIAGNOSIS — R351 Nocturia: Secondary | ICD-10-CM

## 2022-02-12 DIAGNOSIS — R519 Headache, unspecified: Secondary | ICD-10-CM | POA: Diagnosis not present

## 2022-02-12 DIAGNOSIS — G4730 Sleep apnea, unspecified: Secondary | ICD-10-CM

## 2022-02-12 DIAGNOSIS — R635 Abnormal weight gain: Secondary | ICD-10-CM

## 2022-02-12 DIAGNOSIS — R0681 Apnea, not elsewhere classified: Secondary | ICD-10-CM

## 2022-02-12 DIAGNOSIS — R0683 Snoring: Secondary | ICD-10-CM

## 2022-02-12 NOTE — Progress Notes (Signed)
Subjective:    Patient ID: Giamarie Bueche is a 46 y.o. female.  HPI    Star Age, MD, PhD [facility] [address], [address] [place], [place] [address]  Dear [name],   I saw your patient, Latrena Benegas, upon your kind request in my sleep clinic today for initial consultation of her sleep disorder, in particular, concern for underlying obstructive sleep apnea.  The patient is accompanied by her husband and a Spanish interpreter today.  As you know, [name] is a 46 year old right-handed woman with medical history of diabetes, history of shingles, neck pain, migraine headaches, carpal tunnel syndrome, hypertension, hyperlipidemia, anxiety, depression, arthritis, asthma, anemia, fatty liver disease, and severe obesity with a BMI of over 40, who reports snoring and excessive daytime somnolence as well as witnessed apneas per husband's report.  I reviewed your office note from 12/16/2021.  Her Epworth sleepiness score is 5 fatigue severity score is 40 out of 63.  She does not wake up rested, she feels tired during the day but does not fall asleep advertently typically.  She has trouble going to sleep and staying asleep.  In the past she tried gabapentin for a few nights but had side effects during the day so she stopped it.  She does not take any over-the-counter medication for sleep.  She goes to bed generally around 8:30 PM or 9 PM but may not be asleep until 1 AM.  She has a rise time of around 5:30 AM or 6 AM.  She has nocturia once per average night and has occasional morning headaches which are generalized.  She has gained weight over the past 3 years in the realm of 50 pounds.  She is working on weight loss now.  She had multiple surgeries in the past 3 years.  She lives with her husband and her 2 sons as well as her daughter-in-law.  They have 1 cat in the household.  She had a tonsillectomy some 6 years ago.  She has no  family history of sleep apnea.  She currently works from home.  She does not smoke and drinks alcohol very rarely, no daily caffeine.   Her Past Medical History Is Significant For: Past Medical History:  Diagnosis Date   Anemia    Anemia    Anxiety    Arthritis    Asthma    Bilateral chronic knee pain 09/25/2015   Carpal tunnel syndrome    Depression    DEPRESSION, SITUATIONAL, PROLONGED 02/05/2009   Qualifier: Diagnosis of  By: Sherilyn Cooter  MD, Khary     Diabetes mellitus without complication ([place])    Dizziness 04/22/2021   Dyspnea    Elevated blood-pressure reading, without diagnosis of hypertension 06/17/2020   Endometriosis    Fatigue 04/22/2021   History of blood transfusion 1999   w/vaginal delivery   History of bronchitis    "I've stayed in hospital 2 X w/bronchitis"   Hyperlipidemia    Hypertension    Impingement syndrome of left shoulder    Influenza vaccine needed 04/03/2020   Left carpal tunnel syndrome 07/11/2019   Migraine    Moderate persistent asthma with acute exacerbation 12/06/2006   Qualifier: Diagnosis of  By: Sherilyn Cooter  MD, Khary     Neck pain 10/14/2020   Pneumonia    PONV (postoperative nausea and vomiting)    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   Pre-diabetes 10/03/2019   A1C 6.4   Right carpal tunnel syndrome 07/11/2019  S/P carpal tunnel release 08/30/2019   Seasonal allergies 10/08/2016   Shingles 11/14/2020   Sinusitis, bacterial 10/07/2021    Her Past Surgical History Is Significant For: Past Surgical History:  Procedure Laterality Date   ABDOMINAL HYSTERECTOMY  10/17/2019   APPENDECTOMY     CARPAL TUNNEL RELEASE Right 07/19/2019   Procedure: RIGHT CARPAL TUNNEL RELEASE;  Surgeon: Leandrew Koyanagi, MD;  Location: [place];  Service: Orthopedics;  Laterality: Right;   CARPAL TUNNEL RELEASE Left 09/13/2019   Procedure: LEFT CARPAL TUNNEL RELEASE;  Surgeon: Leandrew Koyanagi, MD;  Location: [place];  Service: Orthopedics;   Laterality: Left;  Bier block   [place]; 2008   CHOLECYSTECTOMY  2011   SHOULDER ARTHROSCOPY WITH DISTAL CLAVICLE RESECTION  02/12/2021   Procedure: SHOULDER ARTHROSCOPY WITH DISTAL CLAVICLE RESECTION;  Surgeon: Leandrew Koyanagi, MD;  Location: [place];  Service: Orthopedics;;   SHOULDER ARTHROSCOPY WITH ROTATOR CUFF REPAIR AND SUBACROMIAL DECOMPRESSION Left 02/12/2021   Procedure: LEFT SHOULDER ARTHROSCOPY WITH ROTATOR CUFF REPAIR, SUBACROMIAL DECOMPRESSION, EXTENSIVE DEBRIDEMENT;  Surgeon: Leandrew Koyanagi, MD;  Location: [place];  Service: Orthopedics;  Laterality: Left;   TONSILLECTOMY     tonsilletomy     TUBAL LIGATION  04/2007   ULNAR TUNNEL RELEASE Right 12/17/2021   Procedure: RIGHT CUBITAL TUNNEL RELEASE;  Surgeon: Leandrew Koyanagi, MD;  Location: [place];  Service: Orthopedics;  Laterality: Right;   VAGINAL HYSTERECTOMY Bilateral 10/17/2019   Procedure: HYSTERECTOMY VAGINAL WITH SALPINGECTOMY;  Surgeon: Chancy Milroy, MD;  Location: [place];  Service: Gynecology;  Laterality: Bilateral;    Her Family History Is Significant For: Family History  Problem Relation Age of Onset   Diabetes Mother    Osteoarthritis Mother    Hypertension Mother    Hyperlipidemia Mother    Arthritis Mother     Her Social History Is Significant For: Social History   Socioeconomic History   Marital status: Married    Spouse name: Not on file   Number of children: 3   Years of education: Not on file   Highest education level: 12th grade  Occupational History   Not on file  Tobacco Use   Smoking status: Never   Smokeless tobacco: Never  Vaping Use   Vaping Use: Never used  Substance and Sexual Activity   Alcohol use: No   Drug use: No   Sexual activity: Yes    Birth control/protection: Surgical  Other Topics Concern   Not on file  Social History Narrative   Caffeine none.     Education: 6 th grade.    Work:  remote    Comptroller Strain: Not on file  Food Insecurity: Food Insecurity Present (11/21/2019)   Hunger Vital Sign    Worried About Running Out of Food in the Last Year: Sometimes true    Ran Out of Food in the Last Year: Sometimes true  Transportation Needs: No Transportation Needs (11/21/2019)   PRAPARE - Hydrologist (Medical): No    Lack of Transportation (Non-Medical): No  Physical Activity: Not on file  Stress: Not on file  Social Connections: Not on file    Her Allergies Are:  No Known Allergies:   Her Current Medications Are:  Outpatient Encounter Medications as of 02/12/2022  Medication Sig   atorvastatin (LIPITOR) 20 MG tablet Take 1 tablet (20 mg  total) by mouth at bedtime.   Dulaglutide (TRULICITY) 3 EN/2.7PO SOPN Inject 3 mg as directed once a week.   fluticasone (FLONASE) 50 MCG/ACT nasal spray Place 2 sprays into both nostrils daily for allergies.   gabapentin (NEURONTIN) 100 MG capsule Take 1 capsule (100 mg total) by mouth at bedtime.   hydrOXYzine (ATARAX) 50 MG tablet Take 1 tablet (50 mg total) by mouth at bedtime and may repeat dose one time if needed for anxiety and sleep.   lisinopril (ZESTRIL) 10 MG tablet Take 1 tablet (10 mg total) by mouth daily.   metFORMIN (GLUCOPHAGE) 500 MG tablet Take 1 tablet (500 mg total) by mouth daily with breakfast AND 2 tablets (1,000 mg total) with dinner   Multiple Vitamin (MULTIVITAMIN WITH MINERALS) TABS tablet Take 1 tablet by mouth daily in the afternoon.   ondansetron (ZOFRAN) 4 MG tablet Take 1-2 tablets (4-8 mg total) by mouth every 8 (eight) hours as needed for nausea or vomiting.   sertraline (ZOLOFT) 50 MG tablet Take 1 tablet (50 mg total) by mouth daily.   Spacer/Aero-Holding Chambers (AEROCHAMBER PLUS FLO-VU LARGE) MISC See admin instructions.   albuterol (VENTOLIN HFA) 108 (90 Base) MCG/ACT inhaler Inhale 2 puffs into the lungs in the morning and at bedtime.  (Patient not taking: Reported on 02/12/2022)   busPIRone (BUSPAR) 5 MG tablet Take 1 tablet (5 mg total) by mouth 3 (three) times daily as needed. (Patient not taking: Reported on 02/12/2022)   HYDROcodone-acetaminophen (NORCO) 5-325 MG tablet Take 1 tablet by mouth every 6 (six) hours as needed. (Patient not taking: Reported on 02/12/2022)   [DISCONTINUED] COVID-19 mRNA bivalent vaccine, Pfizer, (PFIZER COVID-19 VAC BIVALENT) injection Inject into the muscle.   [DISCONTINUED] diclofenac (VOLTAREN) 50 MG EC tablet Take 1 tablet (50 mg total) by mouth 2 (two) times daily.   No facility-administered encounter medications on file as of 02/12/2022.  :   Review of Systems:  Out of a complete 14 point review of systems, all are reviewed and negative with the exception of these symptoms as listed below:   Review of Systems  Neurological:        Snoring, apnea, ESS 5, FSS 40.  Daytime sleepiness.  No sleep study.     Objective:  Neurological Exam  Physical Exam Physical Examination:   Vitals:   02/12/22 0924  BP: 110/68  Pulse: 77    General Examination: The patient is a very pleasant 46 y.o. female in no acute distress. She appears well-developed and well-nourished and well groomed.   HEENT: Normocephalic, atraumatic, pupils are equal, round and reactive to light, extraocular tracking is good without limitation to gaze excursion or nystagmus noted. Hearing is grossly intact. Face is symmetric with normal facial animation. Speech is clear with no dysarthria noted. There is no hypophonia. There is no lip, neck/head, jaw or voice tremor. Neck is supple with full range of passive and active motion. There are no carotid bruits on auscultation. Oropharynx exam reveals: mild mouth dryness, good dental hygiene and moderate airway crowding, due to small airway entry and thicker soft palate.  Mallampati class II.  Neck circumference 17 inches, tonsils absent.  Tongue protrudes centrally and palate elevates  symmetrically, minimal overbite.  Chest: Clear to auscultation without wheezing, rhonchi or crackles noted.  Heart: S1+S2+0, regular and normal without murmurs, rubs or gallops noted.   Abdomen: Soft, non-tender and non-distended.  Extremities: There is no obvious edema in the distal lower extremities bilaterally.   Skin: Warm  and dry without trophic changes noted.   Musculoskeletal: exam reveals no obvious joint deformities.   Neurologically:  Mental status: The patient is awake, alert and oriented in all 4 spheres. Her immediate and remote memory, attention, language skills and fund of knowledge are appropriate. There is no evidence of aphasia, agnosia, apraxia or anomia. Speech is clear with normal prosody and enunciation. Thought process is linear. Mood is normal and affect is normal.  Cranial nerves II - XII are as described above under HEENT exam.  Motor exam: Normal bulk, strength and tone is noted. There is no obvious tremor.  Fine motor skills and coordination: grossly intact.  Cerebellar testing: No dysmetria or intention tremor. There is no truncal or gait ataxia.  Sensory exam: intact to light touch in the upper and lower extremities.  Gait, station and balance: She stands easily. No veering to one side is noted. No leaning to one side is noted. Posture is age-appropriate and stance is narrow based. Gait shows normal stride length and normal pace. No problems turning are noted.   Assessment and Plan:  In summary, Masako Overall is a very pleasant 46 y.o.-year old female with medical history of diabetes, history of shingles, neck pain, migraine headaches, carpal tunnel syndrome, hypertension, hyperlipidemia, anxiety, depression, arthritis, asthma, anemia, fatty liver disease, and severe obesity with a BMI of over 40, whose history and physical exam are concerning for sleep disordered breathing, supporting a current working diagnosis of unspecified sleep apnea, with  the main differential diagnoses of obstructive sleep apnea (OSA) versus upper airway resistance syndrome (UARS) versus central sleep apnea (CSA), or mixed sleep apnea. A laboratory attended sleep study is considered gold standard for evaluation of sleep disordered breathing and is recommended at this time and clinically justified.   I had a long chat with the patient and her husband about my findings and the diagnosis of sleep apnea, particularly OSA, its prognosis and treatment options. We talked about medical/conservative treatments, surgical interventions and non-pharmacological approaches for symptom control. I explained, in particular, the risks and ramifications of untreated moderate to severe OSA, especially with respect to developing cardiovascular disease down the road, including congestive heart failure (CHF), difficult to treat hypertension, cardiac arrhythmias (particularly A-fib), neurovascular complications including TIA, stroke and dementia. Even type 2 diabetes has, in part, been linked to untreated OSA. Symptoms of untreated OSA may include (but may not be limited to) daytime sleepiness, nocturia (i.e. frequent nighttime urination), memory problems, mood irritability and suboptimally controlled or worsening mood disorder such as depression and/or anxiety, lack of energy, lack of motivation, physical discomfort, as well as recurrent headaches, especially morning or nocturnal headaches. We talked about the importance of maintaining a healthy lifestyle and striving for healthy weight.   I recommended the following at this time: sleep study.  I outlined the differences between a laboratory attended sleep study which is considered more comprehensive and accurate over the option of a home sleep test (HST); the latter may lead to underestimation of sleep disordered breathing in some instances and does not help with diagnosing upper airway resistance syndrome and is not accurate enough to diagnose primary  central sleep apnea typically. I explained the different sleep test procedures to the patient in detail and also outlined possible surgical and non-surgical treatment options of OSA, including the use of a pressure airway pressure (PAP) device (ie CPAP, AutoPAP/APAP or BiPAP in certain circumstances), a custom-made dental device (aka oral appliance, which would require a referral to a  specialist dentist or orthodontist typically, and is generally speaking not considered a good choice for patients with full dentures or edentulous state), upper airway surgical options, such as traditional UPPP (which is not considered a first-line treatment) or the Inspire device (hypoglossal nerve stimulator, which would involve a referral for consultation with an ENT surgeon, after careful selection, following inclusion criteria). I explained the PAP treatment option to the patient in detail, as this is generally considered first-line treatment.  The patient indicated that she would be willing to try PAP therapy, if the need arises. I explained the importance of being compliant with PAP treatment, not only for insurance purposes but primarily to improve patient's symptoms symptoms, and for the patient's long term health benefit, including to reduce Her cardiovascular risks longer-term.    We will pick up our discussion about the next steps and treatment options after testing.  We will keep her posted as to the test results by phone call and/or MyChart messaging where possible.  We will plan to follow-up in sleep clinic accordingly as well.  I answered all their questions today and the patient and her husband were in agreement.   I encouraged them to call with any interim questions, concerns, problems or updates or email [place] through [place].  Generally speaking, sleep test authorizations may take up to 2 weeks, sometimes less, sometimes longer, the patient is encouraged to get in touch with [place] if they do not hear back from the  sleep lab staff directly within the next 2 weeks.  Thank you very much for allowing me to participate in the care of this nice patient. If I can be of any further assistance to you please do not hesitate to call me at [phone].  Sincerely,   Star Age, MD, PhD

## 2022-02-12 NOTE — Patient Instructions (Signed)

## 2022-02-13 ENCOUNTER — Other Ambulatory Visit (HOSPITAL_COMMUNITY): Payer: Self-pay

## 2022-02-13 ENCOUNTER — Ambulatory Visit (INDEPENDENT_AMBULATORY_CARE_PROVIDER_SITE_OTHER): Payer: BC Managed Care – PPO | Admitting: Specialist

## 2022-02-13 ENCOUNTER — Encounter: Payer: Self-pay | Admitting: Specialist

## 2022-02-13 VITALS — BP 94/56 | HR 80 | Ht 61.0 in | Wt 239.0 lb

## 2022-02-13 DIAGNOSIS — R202 Paresthesia of skin: Secondary | ICD-10-CM | POA: Diagnosis not present

## 2022-02-13 DIAGNOSIS — M5010 Cervical disc disorder with radiculopathy, unspecified cervical region: Secondary | ICD-10-CM | POA: Diagnosis not present

## 2022-02-13 DIAGNOSIS — M542 Cervicalgia: Secondary | ICD-10-CM

## 2022-02-13 DIAGNOSIS — M47812 Spondylosis without myelopathy or radiculopathy, cervical region: Secondary | ICD-10-CM | POA: Diagnosis not present

## 2022-02-13 DIAGNOSIS — M5412 Radiculopathy, cervical region: Secondary | ICD-10-CM

## 2022-02-13 MED ORDER — TRAMADOL-ACETAMINOPHEN 37.5-325 MG PO TABS
1.0000 | ORAL_TABLET | Freq: Four times a day (QID) | ORAL | 0 refills | Status: DC | PRN
  Filled 2022-02-13: qty 30, 8d supply, fill #0

## 2022-02-13 NOTE — Patient Instructions (Signed)
Plan: Avoid bending, stooping and avoid lifting weights greater than 10 lbs. Avoid prolong standing and walking. Avoid frequent bending and stooping  No lifting greater than 10 lbs. May use ice or moist heat for pain. Weight loss is of benefit. Handicap license is approved. Dr. Romona Curls secretary/Assistant will call to arrange for left C5 SNR (selective nerve root block) injection

## 2022-02-13 NOTE — Progress Notes (Signed)
Office Visit Note   Patient: Alexandra Norris           Date of Birth: [birth year]           MRN: [id number] Visit Date: 02/13/2022              Requested by: Orma Render, NP [place],  [place] [id number] PCP: Orma Render, NP   Assessment & Plan: Visit Diagnoses:  1. Herniation of cervical intervertebral disc with radiculopathy   2. Radiculopathy of cervical spine   3. Paresthesia of skin   4. Cervicalgia   5. Spondylosis without myelopathy or radiculopathy, cervical region     Plan: Avoid bending, stooping and avoid lifting weights greater than 10 lbs. Avoid prolong standing and walking. Avoid frequent bending and stooping  No lifting greater than 10 lbs. May use ice or moist heat for pain. Weight loss is of benefit. Handicap license is approved. Dr. Romona Curls secretary/Assistant will call to arrange for left C5 SNR (selective nerve root block) injection    Follow-Up Instructions: No follow-ups on file.   Orders:  No orders of the defined types were placed in this encounter.  No orders of the defined types were placed in this encounter.     Procedures: No procedures performed   Clinical Data: Findings:  CLINICAL DATA:  Worsening chronic neck pain radiating into the occipital area. Prior cervical spine surgery.   EXAM: MRI CERVICAL SPINE WITHOUT CONTRAST   TECHNIQUE: Multiplanar, multisequence MR imaging of the cervical spine was performed. No intravenous contrast was administered.   COMPARISON:  Cervical spine MRI 12/29/2020. Cervical myelogram 04/01/2021.   FINDINGS: Alignment: Chronic cervical spine straightening.  No listhesis.   Vertebrae: No fracture, suspicious marrow lesion, or significant marrow edema. Prior C5-6 ACDF.   Cord: Normal signal.   Posterior Fossa, vertebral arteries, paraspinal tissues: Unremarkable.   Disc levels:   C2-3: Moderate left facet arthrosis results in borderline to  mild left neural foraminal stenosis, unchanged. There is an unchanged small central disc protrusion without significant spinal stenosis.   C3-4: A small central disc protrusion and mild to moderate left facet arthrosis result in mild spinal stenosis, slightly indenting the ventral spinal cord, and mild left neural foraminal stenosis, unchanged.   C4-5: A small left paracentral disc protrusion, uncovertebral spurring, and mild left facet arthrosis result in mild spinal stenosis, the left ventral spinal cord, and mild left neural foraminal stenosis, unchanged.   C5-6: ACDF. Patent spinal canal. At most mild residual neural foraminal narrowing due to uncovertebral spurring, better demonstrated on the prior CT myelogram.   C6-7: Mild disc bulging and a small central disc protrusion result in mild spinal stenosis without neural foraminal stenosis, unchanged.   C7-T1: Mild-to-moderate left facet arthrosis without disc herniation or significant stenosis, unchanged.   IMPRESSION: 1. Unchanged cervical disc and facet degeneration resulting in mild multilevel spinal and neural foraminal stenosis as above. 2. Prior C5-6 ACDF without residual spinal stenosis.     Electronically Signed   By: Logan Bores M.D.   On: 01/28/2022 10:42      Subjective: Chief Complaint  Patient presents with   Neck - Follow-up    46 year old right handed female with 2 year history of neck pain and stiffness. She underwent ACDF for pain in her neck and arms, shoulders and with numbness in the left arm and weakness more than the right. Since the surgeries.    Review of Systems  Constitutional: Negative.   HENT: Negative.    Eyes: Negative.   Respiratory: Negative.    Cardiovascular: Negative.   Gastrointestinal: Negative.   Endocrine: Negative.   Genitourinary: Negative.   Musculoskeletal: Negative.   Skin: Negative.   Allergic/Immunologic: Negative.   Neurological: Negative.   Hematological:  Negative.   Psychiatric/Behavioral: Negative.       Objective: Vital Signs: BP (!) 94/56 (BP Location: Left Arm, Patient Position: Sitting)   Pulse 80   Ht '5\' 1"'$  (1.549 m)   Wt 239 lb (108.4 kg)   LMP 09/20/2019 (Exact Date)   BMI 45.16 kg/m   Physical Exam Constitutional:      Appearance: She is well-developed.  HENT:     Head: Normocephalic and atraumatic.  Eyes:     Pupils: Pupils are equal, round, and reactive to light.  Pulmonary:     Effort: Pulmonary effort is normal.     Breath sounds: Normal breath sounds.  Abdominal:     General: Bowel sounds are normal.     Palpations: Abdomen is soft.  Musculoskeletal:     Cervical back: Normal range of motion and neck supple.     Lumbar back: Negative right straight leg raise test and negative left straight leg raise test.  Skin:    General: Skin is warm and dry.  Neurological:     Mental Status: She is alert and oriented to person, place, and time.  Psychiatric:        Behavior: Behavior normal.        Thought Content: Thought content normal.        Judgment: Judgment normal.     Back Exam   Tenderness  The patient is experiencing tenderness in the cervical.  Range of Motion  Extension:  abnormal  Flexion:  abnormal  Lateral bend right:  abnormal  Lateral bend left:  abnormal  Rotation right:  abnormal  Rotation left:  abnormal   Muscle Strength  Right Quadriceps:  5/5  Left Quadriceps:  5/5  Right Hamstrings:  5/5   Tests  Straight leg raise right: negative Straight leg raise left: negative  Reflexes  Achilles:  0/4 Babinski's sign: normal   Other  Toe walk: normal Heel walk: normal Sensation: normal Gait: normal  Erythema: no back redness Scars: present      Specialty Comments:  No specialty comments available.  Imaging: No results found.   PMFS History: Patient Active Problem List   Diagnosis Date Noted   Snoring 01/20/2022   Impingement syndrome of right shoulder 12/30/2021    Cubital tunnel syndrome on right 12/02/2021   Asthma due to seasonal allergies 09/25/2021   Cough 07/15/2021   S/P arthroscopy of right shoulder 07/08/2021   Type 1 superior labral anterior-to-posterior (SLAP) tear of right shoulder 04/24/2021   Arthritis of right acromioclavicular joint 04/24/2021   Chronic left shoulder pain 04/22/2021   Hypertension associated with diabetes ([place]) 04/22/2021   Hyperlipidemia associated with type 2 diabetes mellitus ([place]) 04/22/2021   Nontraumatic tear of left supraspinatus tendon 02/12/2021   Encounter to establish care 09/09/2020   Fatty liver disease, nonalcoholic 28/41/3244   Cervical disc disorder at C5-C6 level with radiculopathy 04/03/2020   Arthritis    Nasal obstruction 02/18/2016   DM2 (diabetes mellitus, type 2) ([place]) 10/21/2015   Vitamin D deficiency 09/25/2015   Anxiety and depression 07/06/2014   GASTROESOPHAGEAL REFLUX DISEASE 08/14/2009   Body mass index (BMI) 45.0-49.9, adult ([place]) 05/30/2007   Past Medical History:  Diagnosis  Date   Anemia    Anemia    Anxiety    Arthritis    Asthma    Bilateral chronic knee pain 09/25/2015   Carpal tunnel syndrome    Depression    DEPRESSION, SITUATIONAL, PROLONGED 02/05/2009   Qualifier: Diagnosis of  By: Sherilyn Cooter  MD, Khary     Diabetes mellitus without complication ([place])    Dizziness 04/22/2021   Dyspnea    Elevated blood-pressure reading, without diagnosis of hypertension 06/17/2020   Endometriosis    Fatigue 04/22/2021   History of blood transfusion 1999   w/vaginal delivery   History of bronchitis    "I've stayed in hospital 2 X w/bronchitis"   Hyperlipidemia    Hypertension    Impingement syndrome of left shoulder    Influenza vaccine needed 04/03/2020   Left carpal tunnel syndrome 07/11/2019   Migraine    Moderate persistent asthma with acute exacerbation 12/06/2006   Qualifier: Diagnosis of  By: Sherilyn Cooter  MD, Khary     Neck pain 10/14/2020   Pneumonia    PONV (postoperative  nausea and vomiting)    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   Pre-diabetes 10/03/2019   A1C 6.4   Right carpal tunnel syndrome 07/11/2019   S/P carpal tunnel release 08/30/2019   Seasonal allergies 10/08/2016   Shingles 11/14/2020   Sinusitis, bacterial 10/07/2021    Family History  Problem Relation Age of Onset   Diabetes Mother    Osteoarthritis Mother    Hypertension Mother    Hyperlipidemia Mother    Arthritis Mother     Past Surgical History:  Procedure Laterality Date   ABDOMINAL HYSTERECTOMY  10/17/2019   APPENDECTOMY     CARPAL TUNNEL RELEASE Right 07/19/2019   Procedure: RIGHT CARPAL TUNNEL RELEASE;  Surgeon: Leandrew Koyanagi, MD;  Location: [facility];  Service: Orthopedics;  Laterality: Right;   CARPAL TUNNEL RELEASE Left 09/13/2019   Procedure: LEFT CARPAL TUNNEL RELEASE;  Surgeon: Leandrew Koyanagi, MD;  Location: [facility];  Service: Orthopedics;  Laterality: Left;  Bier block   [place]; 2008   CHOLECYSTECTOMY  2011   SHOULDER ARTHROSCOPY WITH DISTAL CLAVICLE RESECTION  02/12/2021   Procedure: SHOULDER ARTHROSCOPY WITH DISTAL CLAVICLE RESECTION;  Surgeon: Leandrew Koyanagi, MD;  Location: [facility];  Service: Orthopedics;;   SHOULDER ARTHROSCOPY WITH ROTATOR CUFF REPAIR AND SUBACROMIAL DECOMPRESSION Left 02/12/2021   Procedure: LEFT SHOULDER ARTHROSCOPY WITH ROTATOR CUFF REPAIR, SUBACROMIAL DECOMPRESSION, EXTENSIVE DEBRIDEMENT;  Surgeon: Leandrew Koyanagi, MD;  Location: [facility];  Service: Orthopedics;  Laterality: Left;   TONSILLECTOMY     tonsilletomy     TUBAL LIGATION  04/2007   ULNAR TUNNEL RELEASE Right 12/17/2021   Procedure: RIGHT CUBITAL TUNNEL RELEASE;  Surgeon: Leandrew Koyanagi, MD;  Location: [facility];  Service: Orthopedics;  Laterality: Right;   VAGINAL HYSTERECTOMY Bilateral 10/17/2019   Procedure: HYSTERECTOMY VAGINAL WITH SALPINGECTOMY;  Surgeon: Chancy Milroy, MD;  Location:  [place];  Service: Gynecology;  Laterality: Bilateral;   Social History   Occupational History   Not on file  Tobacco Use   Smoking status: Never   Smokeless tobacco: Never  Vaping Use   Vaping Use: Never used  Substance and Sexual Activity   Alcohol use: No   Drug use: No   Sexual activity: Yes    Birth control/protection: Surgical

## 2022-02-23 ENCOUNTER — Telehealth: Payer: Self-pay | Admitting: Physical Medicine and Rehabilitation

## 2022-02-23 NOTE — Telephone Encounter (Signed)
Patients husband called on behalf of wife asking for a call back to schedule an injection with Dr. Ernestina Patches, wanting to know if she could get an appointment this week. CB # [phone]

## 2022-02-24 NOTE — Telephone Encounter (Signed)
Tried calling to advise per [facility], no answer. LMVM

## 2022-02-25 ENCOUNTER — Ambulatory Visit: Payer: BC Managed Care – PPO | Admitting: Podiatry

## 2022-02-25 DIAGNOSIS — L600 Ingrowing nail: Secondary | ICD-10-CM

## 2022-02-25 NOTE — Progress Notes (Signed)
Subjective:  Patient ID: Alexandra Norris, female    DOB: July 03, 1975,  MRN: [id number]  Chief Complaint  Patient presents with   Nail Problem    Nail discomfort     46 y.o. female presents with the above complaint.  Patient presents with bilateral hallux border medial ingrown.  Patient states pain for touch is progressive gotten worse.  She would like to have removed.  She states that the other side was previously done by Dr. Lynnell Catalan.  The site is still active.  She has not seen anyone else prior to seeing me pain scale 7 out of 10 large ambulation hurts with pressure.  No infection noted at this time   Review of Systems: Negative except as noted in the HPI. Denies N/V/F/Ch.  Past Medical History:  Diagnosis Date   Anemia    Anemia    Anxiety    Arthritis    Asthma    Bilateral chronic knee pain 09/25/2015   Carpal tunnel syndrome    Depression    DEPRESSION, SITUATIONAL, PROLONGED 02/05/2009   Qualifier: Diagnosis of  By: Sherilyn Cooter  MD, Khary     Diabetes mellitus without complication ([place])    Dizziness 04/22/2021   Dyspnea    Elevated blood-pressure reading, without diagnosis of hypertension 06/17/2020   Endometriosis    Fatigue 04/22/2021   History of blood transfusion 1999   w/vaginal delivery   History of bronchitis    "I've stayed in hospital 2 X w/bronchitis"   Hyperlipidemia    Hypertension    Impingement syndrome of left shoulder    Influenza vaccine needed 04/03/2020   Left carpal tunnel syndrome 07/11/2019   Migraine    Moderate persistent asthma with acute exacerbation 12/06/2006   Qualifier: Diagnosis of  By: Sherilyn Cooter  MD, Khary     Neck pain 10/14/2020   Pneumonia    PONV (postoperative nausea and vomiting)    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 (Patient not taking: Reported on 02/12/2022), Disp: 18 g, Rfl: 2   atorvastatin (LIPITOR) 20 MG tablet, Take 1 tablet (20 mg total) by mouth at bedtime., Disp: 90 tablet, Rfl: 3   busPIRone (BUSPAR) 5 MG tablet, Take 1 tablet (5 mg total) by mouth 3 (three) times daily as needed. (Patient not taking: Reported on 02/12/2022), Disp: 90 tablet, Rfl: 11   Dulaglutide (TRULICITY) 3 VO/3.5KK SOPN, Inject 3 mg as directed once a week., Disp: 2 mL, Rfl: 11   fluticasone (FLONASE) 50 MCG/ACT nasal spray, Place 2 sprays into both nostrils daily for allergies., Disp: 16 g, Rfl: 6   gabapentin (NEURONTIN) 100 MG capsule, Take 1 capsule (100 mg total) by mouth at bedtime., Disp: 30 capsule, Rfl: 3   HYDROcodone-acetaminophen (NORCO) 5-325 MG tablet, Take 1 tablet by mouth every 6 (six) hours as needed. (Patient not taking: Reported on 02/12/2022), Disp: 20 tablet, Rfl: 0   hydrOXYzine (ATARAX) 50 MG tablet, Take 1 tablet (50 mg total) by mouth at bedtime and may repeat dose one time if needed for anxiety and sleep., Disp: 60 tablet, Rfl: 11   lisinopril (ZESTRIL) 10 MG tablet, Take 1 tablet (  10 mg total) by mouth daily., Disp: 90 tablet, Rfl: 3   metFORMIN (GLUCOPHAGE) 500 MG tablet, Take 1 tablet (500 mg total) by mouth daily with breakfast AND 2 tablets (1,000 mg total) with dinner, Disp: 270 tablet, Rfl: 3   Multiple Vitamin (MULTIVITAMIN WITH MINERALS) TABS tablet, Take 1 tablet by mouth daily in the afternoon., Disp: , Rfl:    ondansetron (ZOFRAN) 4 MG tablet, Take 1-2 tablets (4-8 mg total) by mouth every 8 (eight) hours as needed for nausea or vomiting., Disp: 20 tablet, Rfl: 0   sertraline (ZOLOFT) 50 MG tablet, Take 1 tablet (50 mg total) by mouth daily., Disp: 90 tablet, Rfl: 3   Spacer/Aero-Holding Chambers (AEROCHAMBER PLUS FLO-VU LARGE) MISC,  See admin instructions., Disp: , Rfl:    traMADol-acetaminophen (ULTRACET) 37.5-325 MG tablet, Take 1 tablet by mouth every 6 (six) hours as needed., Disp: 30 tablet, Rfl: 0  Social History   Tobacco Use  Smoking Status Never  Smokeless Tobacco Never    No Known Allergies Objective:  There were no vitals filed for this visit. There is no height or weight on file to calculate BMI. Constitutional Well developed. Well nourished.  Vascular Dorsalis pedis pulses palpable bilaterally. Posterior tibial pulses palpable bilaterally. Capillary refill normal to all digits.  No cyanosis or clubbing noted. Pedal hair growth normal.  Neurologic Normal speech. Oriented to person, place, and time. Epicritic sensation to light touch grossly present bilaterally.  Dermatologic Painful ingrowing nail at medial nail borders of the hallux nail bilaterally. No other open wounds. No skin lesions.  Orthopedic: Normal joint ROM without pain or crepitus bilaterally. No visible deformities. No bony tenderness.   Radiographs: None Assessment:   1. Ingrowing left great toenail   2. Ingrowing right great toenail    Plan:  Patient was evaluated and treated and all questions answered.  Ingrown Nail, bilaterally -Patient elects to proceed with minor surgery to remove ingrown toenail removal today. Consent reviewed and signed by patient. -Ingrown nail excised. See procedure note. -Educated on post-procedure care including soaking. Written instructions provided and reviewed. -Patient to follow up in 2 weeks for nail check.  Procedure: Excision of Ingrown Toenail Location: Bilateral 1st toe medial nail borders. Anesthesia: Lidocaine 1% plain; 1.5 mL and Marcaine 0.5% plain; 1.5 mL, digital block. Skin Prep: Betadine. Dressing: Silvadene; telfa; dry, sterile, compression dressing. Technique: Following skin prep, the toe was exsanguinated and a tourniquet was secured at the base of the toe. The affected  nail border was freed, split with a nail splitter, and excised. Chemical matrixectomy was then performed with phenol and irrigated out with alcohol. The tourniquet was then removed and sterile dressing applied. Disposition: Patient tolerated procedure well. Patient to return in 2 weeks for follow-up.   No follow-ups on file.

## 2022-03-01 NOTE — Progress Notes (Signed)
Nurse Visit: CMA reports patient presents with vaginal itching for the past several days. She is also experiencing increased vaginal discharge. She reports her symptoms are consistent with yeast infection in the past. No new partners, STI hx, or urinary sx.  Request vaginal self-swab  A&P: KOH test negative. Treatment sent for vaginal yeast infection. If sx persist patient will return for further evaluation.   Porfirio Oar, NP, have reviewed all documentation for this visit. The documentation on 01/28/2022 for the exam, diagnosis, procedures, and orders are all accurate and complete.   Hawk Mones, Coralee Pesa, NP, DNP, AGNP-c [facility] at [place]

## 2022-03-04 ENCOUNTER — Telehealth: Payer: Self-pay | Admitting: Neurology

## 2022-03-04 NOTE — Telephone Encounter (Signed)
MAIL OUT HST- [employer] Josem Kaufmann: [id number] (exp. 02/26/22 to 04/26/22).  Sent out on 03/05/22.

## 2022-03-05 ENCOUNTER — Ambulatory Visit: Payer: BC Managed Care – PPO | Admitting: Neurology

## 2022-03-05 DIAGNOSIS — R0683 Snoring: Secondary | ICD-10-CM

## 2022-03-05 DIAGNOSIS — R0681 Apnea, not elsewhere classified: Secondary | ICD-10-CM

## 2022-03-05 DIAGNOSIS — G4733 Obstructive sleep apnea (adult) (pediatric): Secondary | ICD-10-CM

## 2022-03-05 DIAGNOSIS — G4700 Insomnia, unspecified: Secondary | ICD-10-CM

## 2022-03-05 DIAGNOSIS — G4730 Sleep apnea, unspecified: Secondary | ICD-10-CM

## 2022-03-05 DIAGNOSIS — R635 Abnormal weight gain: Secondary | ICD-10-CM

## 2022-03-05 DIAGNOSIS — M5412 Radiculopathy, cervical region: Secondary | ICD-10-CM | POA: Insufficient documentation

## 2022-03-05 DIAGNOSIS — R519 Headache, unspecified: Secondary | ICD-10-CM

## 2022-03-05 DIAGNOSIS — R351 Nocturia: Secondary | ICD-10-CM

## 2022-03-06 ENCOUNTER — Ambulatory Visit: Payer: BC Managed Care – PPO | Admitting: Specialist

## 2022-03-06 DIAGNOSIS — M961 Postlaminectomy syndrome, not elsewhere classified: Secondary | ICD-10-CM | POA: Insufficient documentation

## 2022-03-06 HISTORY — DX: Postlaminectomy syndrome, not elsewhere classified: M96.1

## 2022-03-10 ENCOUNTER — Ambulatory Visit: Payer: BC Managed Care – PPO | Admitting: Specialist

## 2022-03-13 ENCOUNTER — Other Ambulatory Visit (HOSPITAL_COMMUNITY): Payer: Self-pay

## 2022-03-13 ENCOUNTER — Encounter (HOSPITAL_BASED_OUTPATIENT_CLINIC_OR_DEPARTMENT_OTHER): Payer: Self-pay | Admitting: Nurse Practitioner

## 2022-03-18 NOTE — Addendum Note (Signed)
Addended by: Star Age on: 03/18/2022 05:25 PM   Modules accepted: Orders

## 2022-03-18 NOTE — Procedures (Signed)
   [facility]  HOME SLEEP TEST (Watch PAT) REPORT, mail out unit.  STUDY DATE: 03/12/2022  DOB: Aug 29, 1975  MRN: [id number]  ORDERING CLINICIAN: Star Age, MD, PhD   REFERRING CLINICIAN: Early, Coralee Pesa, NP   CLINICAL INFORMATION/HISTORY: 46 year old female with a history of diabetes, history of shingles, neck pain, migraine headaches, carpal tunnel syndrome, hypertension, hyperlipidemia, anxiety, depression, arthritis, asthma, anemia, fatty liver disease, and severe obesity with a BMI of over 40, who reports snoring and excessive daytime somnolence as well as witnessed apneas.  Epworth sleepiness score: 5/24.  BMI: 44.1 kg/m  FINDINGS:   Sleep Summary:   Total Recording Time (hours, min): 9 hours, 0 min  Total Sleep Time (hours, min):  6 hours, 31 min  Percent REM (%):    21.9%   Respiratory Indices:   Calculated pAHI (per hour):  16/hour         REM pAHI:    14.9/hour       NREM pAHI: 16.2/hour  Central pAHI: 0.2/hour  Oxygen Saturation Statistics:    Oxygen Saturation (%) Mean: 91%   Minimum oxygen saturation (%):                 80%   O2 Saturation Range (%): 80 - 96%    O2 Saturation (minutes) <=88%: 1 min  Pulse Rate Statistics:   Pulse Mean (bpm):    90/min    Pulse Range (64 - 124/min)   IMPRESSION: OSA (obstructive sleep apnea)   RECOMMENDATION:  This home sleep test demonstrates moderate obstructive sleep apnea with a total AHI of 16/hour and O2 nadir of 80%.  Mild to moderate snoring was detected, at times in the louder range. Treatment with a positive airway pressure (PAP) device is recommended. The patient will be advised to proceed with an autoPAP titration/trial at home for now. A full night titration study may be considered to optimize treatment settings, if needed down the road.  Alternative treatment options may include a dental device through dentistry or orthodontics in selected patients or Inspire (hypoglossal nerve  stimulator) in carefully selected patients (meeting inclusion criteria).  Concomitant weight loss is recommended (where clinically appropriate). Please note that untreated obstructive sleep apnea may carry additional perioperative morbidity. Patients with significant obstructive sleep apnea should receive perioperative PAP therapy and the surgeons and particularly the anesthesiologist should be informed of the diagnosis and the severity of the sleep disordered breathing. The patient should be cautioned not to drive, work at heights, or operate dangerous or heavy equipment when tired or sleepy. Review and reiteration of good sleep hygiene measures should be pursued with any patient. Other causes of the patient's symptoms, including circadian rhythm disturbances, an underlying mood disorder, medication effect and/or an underlying medical problem cannot be ruled out based on this test. Clinical correlation is recommended. The patient and her referring provider will be notified of the test results. The patient will be seen in follow up in sleep clinic at [facility].  I certify that I have reviewed the raw data recording prior to the issuance of this report in accordance with the standards of the American Academy of Sleep Medicine (AASM).  INTERPRETING PHYSICIAN:   Star Age, MD, PhD  Board Certified in Neurology and Sleep Medicine  [facility] [address] [place], [place] [address] [phone]

## 2022-03-19 ENCOUNTER — Telehealth: Payer: Self-pay | Admitting: *Deleted

## 2022-03-19 NOTE — Telephone Encounter (Signed)
I called the patient via [place] Verdis Frederickson # [phone], spanish language) and discussed her sleep study results as noted below by Dr. Rexene Alberts.  The patient is amenable to proceeding with AutoPap.  She does not have a preference for DME company.  She will watch for a call from [facility] and I advised they would call her within a week and will discuss the next steps, everything she needs to know about the AutoPap and will schedule a set up appointment.  The patient is aware of the insurance compliance requirements which includes using the machine at least 4 hours at night and also being seen here at our office for an initial follow-up between 30 and 90 days after set up.  The patient was scheduled for an appointment on 06/03/2022 at 9:45 AM arrival 9:15 AM.  The patient's questions were answered.  I told her I would send [place] phone number and address to her through [place].  The patient verbalized understanding and appreciation for the call.  Referral faxed to [facility].

## 2022-03-19 NOTE — Telephone Encounter (Signed)
-----   Message from Star Age, MD sent at 03/18/2022  5:25 PM EDT ----- Interpreter needed. Patient referred by Jacolyn Reedy, NP, seen by me on 02/12/2022, patient had a HST on 03/12/2022.    Please call and notify the patient that the recent home sleep test showed obstructive sleep apnea in the moderate range. I recommend treatment in the form of autoPAP, which means, that we don't have to bring her in for a sleep study with CPAP, but will let her start using a so called autoPAP machine at home, which is a CPAP-like machine with self-adjusting pressures. We will send the order to a local DME company (of her choice, or as per insurance requirement). The DME representative will fit her with a mask, educate her on how to use the machine, how to put the mask on, etc. I have placed an order in the chart. Please send the order, talk to patient, send report to referring MD. We will need a FU in sleep clinic for 10 weeks post-PAP set up, please arrange that with me or one of our NPs. Also reinforce the need for compliance with treatment. Thanks,   Star Age, MD, PhD [facility])

## 2022-03-23 ENCOUNTER — Other Ambulatory Visit (HOSPITAL_COMMUNITY): Payer: Self-pay

## 2022-03-23 ENCOUNTER — Other Ambulatory Visit (HOSPITAL_BASED_OUTPATIENT_CLINIC_OR_DEPARTMENT_OTHER): Payer: Self-pay

## 2022-03-23 DIAGNOSIS — B379 Candidiasis, unspecified: Secondary | ICD-10-CM

## 2022-03-23 MED ORDER — FLUCONAZOLE 150 MG PO TABS
150.0000 mg | ORAL_TABLET | Freq: Once | ORAL | 1 refills | Status: AC
  Filled 2022-03-23: qty 1, 1d supply, fill #0

## 2022-03-26 ENCOUNTER — Other Ambulatory Visit (HOSPITAL_COMMUNITY): Payer: Self-pay

## 2022-03-26 ENCOUNTER — Encounter: Payer: Self-pay | Admitting: Specialist

## 2022-03-26 ENCOUNTER — Ambulatory Visit (INDEPENDENT_AMBULATORY_CARE_PROVIDER_SITE_OTHER): Payer: BC Managed Care – PPO | Admitting: Specialist

## 2022-03-26 VITALS — BP 113/64 | HR 84 | Ht 61.0 in | Wt 239.0 lb

## 2022-03-26 DIAGNOSIS — M4722 Other spondylosis with radiculopathy, cervical region: Secondary | ICD-10-CM

## 2022-03-26 DIAGNOSIS — M5412 Radiculopathy, cervical region: Secondary | ICD-10-CM

## 2022-03-26 DIAGNOSIS — M5010 Cervical disc disorder with radiculopathy, unspecified cervical region: Secondary | ICD-10-CM | POA: Diagnosis not present

## 2022-03-26 DIAGNOSIS — M47812 Spondylosis without myelopathy or radiculopathy, cervical region: Secondary | ICD-10-CM

## 2022-03-26 MED ORDER — GABAPENTIN 100 MG PO CAPS
100.0000 mg | ORAL_CAPSULE | Freq: Every day | ORAL | 3 refills | Status: DC
  Filled 2022-03-26: qty 90, 90d supply, fill #0

## 2022-03-26 NOTE — Patient Instructions (Addendum)
Avoid overhead lifting and overhead use of the arms. Do not lift greater than 15 lbs. Adjust head rest in vehicle to prevent hyperextension if rear ended. Take extra precautions to avoid falling. If pain and symptoms recurr call  and talk to [facility] and a second ESI would be considered,  Follow-Up Instructions: No follow-ups on file.

## 2022-03-26 NOTE — Progress Notes (Signed)
Office Visit Note   Patient: Alexandra Norris           Date of Birth: [birth year]           MRN: [id number] Visit Date: 03/26/2022              Requested by: Orma Render, NP [address],  [place] [address] PCP: Orma Render, NP   Assessment & Plan: Visit Diagnoses:  1. Spondylosis without myelopathy or radiculopathy, cervical region   2. Radiculopathy of cervical spine   3. Herniation of cervical intervertebral disc with radiculopathy     Plan: Avoid overhead lifting and overhead use of the arms. Do not lift greater than 15 lbs. Adjust head rest in vehicle to prevent hyperextension if rear ended. Take extra precautions to avoid falling. If pain and symptoms recurr call  and talk to [facility] and a second ESI would be considered,  Follow-Up Instructions: No follow-ups on file.   Orders:  No orders of the defined types were placed in this encounter.  No orders of the defined types were placed in this encounter.     Procedures: No procedures performed   Clinical Data: Findings:  C2-3: Moderate left facet arthrosis results in borderline to mild left neural foraminal stenosis, unchanged. There is an unchanged small central disc protrusion without significant spinal stenosis.   C3-4: A small central disc protrusion and mild to moderate left facet arthrosis result in mild spinal stenosis, slightly indenting the ventral spinal cord, and mild left neural foraminal stenosis, unchanged.   C4-5: A small left paracentral disc protrusion, uncovertebral spurring, and mild left facet arthrosis result in mild spinal stenosis, the left ventral spinal cord, and mild left neural foraminal stenosis, unchanged.   C5-6: ACDF. Patent spinal canal. At most mild residual neural foraminal narrowing due to uncovertebral spurring, better demonstrated on the prior CT myelogram.   C6-7: Mild disc bulging and a small central disc protrusion result in  mild spinal stenosis without neural foraminal stenosis, unchanged.   C7-T1: Mild-to-moderate left facet arthrosis without disc herniation or significant stenosis, unchanged.   IMPRESSION: 1. Unchanged cervical disc and facet degeneration resulting in mild multilevel spinal and neural foraminal stenosis as above. 2. Prior C5-6 ACDF without residual spinal stenosis.     Electronically Signed   By: Logan Bores M.D.   On: 01/28/2022 10:42      Subjective: Chief Complaint  Patient presents with   Neck - Follow-up    46 year old right handed female with history of spondylosis of the cervical spine with small cervical HNP. Underwent PT first then most recently a cervical ESI C7-T1 by Dr. Nelva Bush with good improvement in her overall pain and symptoms. No bowell, or bladder dfficulties, walking with out difficulty, no balance or coordination difficulty.     Review of Systems  Constitutional: Negative.   HENT: Negative.    Eyes: Negative.   Respiratory: Negative.    Cardiovascular: Negative.   Gastrointestinal: Negative.   Endocrine: Negative.   Genitourinary: Negative.   Musculoskeletal: Negative.   Skin: Negative.   Allergic/Immunologic: Negative.   Neurological: Negative.   Hematological: Negative.   Psychiatric/Behavioral: Negative.       Objective: Vital Signs: BP 113/64   Pulse 84   Ht '5\' 1"'$  (1.549 m)   Wt 239 lb (108.4 kg)   LMP 09/20/2019 (Exact Date)   BMI 45.16 kg/m   Physical Exam Constitutional:      Appearance:  She is well-developed.  HENT:     Head: Normocephalic and atraumatic.  Eyes:     Pupils: Pupils are equal, round, and reactive to light.  Pulmonary:     Effort: Pulmonary effort is normal.     Breath sounds: Normal breath sounds.  Abdominal:     General: Bowel sounds are normal.     Palpations: Abdomen is soft.  Musculoskeletal:        General: Normal range of motion.     Cervical back: Normal range of motion and neck supple.  Skin:     General: Skin is warm and dry.  Neurological:     Mental Status: She is alert and oriented to person, place, and time.  Psychiatric:        Behavior: Behavior normal.        Thought Content: Thought content normal.        Judgment: Judgment normal.     Back Exam   Tenderness  The patient is experiencing tenderness in the cervical and lumbar.  Range of Motion  Extension:  normal  Flexion:  normal  Lateral bend right:  normal  Lateral bend left:  normal  Rotation right:  normal  Rotation left:  normal   Muscle Strength  Right Quadriceps:  5/5  Left Quadriceps:  5/5  Right Hamstrings:  5/5  Left Hamstrings:  5/5   Comments:  No spasticity or clonus, no focal deficity. No pain complaints today.       Specialty Comments:  No specialty comments available.  Imaging: No results found.   PMFS History: Patient Active Problem List   Diagnosis Date Noted   Snoring 01/20/2022   Impingement syndrome of right shoulder 12/30/2021   Cubital tunnel syndrome on right 12/02/2021   Asthma due to seasonal allergies 09/25/2021   Cough 07/15/2021   S/P arthroscopy of right shoulder 07/08/2021   Type 1 superior labral anterior-to-posterior (SLAP) tear of right shoulder 04/24/2021   Arthritis of right acromioclavicular joint 04/24/2021   Chronic left shoulder pain 04/22/2021   Hypertension associated with diabetes ([place]) 04/22/2021   Hyperlipidemia associated with type 2 diabetes mellitus ([place]) 04/22/2021   Nontraumatic tear of left supraspinatus tendon 02/12/2021   Encounter to establish care 09/09/2020   Fatty liver disease, nonalcoholic 85/63/1497   Cervical disc disorder at C5-C6 level with radiculopathy 04/03/2020   Arthritis    Nasal obstruction 02/18/2016   DM2 (diabetes mellitus, type 2) ([place]) 10/21/2015   Vitamin D deficiency 09/25/2015   Anxiety and depression 07/06/2014   GASTROESOPHAGEAL REFLUX DISEASE 08/14/2009   Body mass index (BMI) 45.0-49.9, adult ([place])  05/30/2007   Past Medical History:  Diagnosis Date   Anemia    Anemia    Anxiety    Arthritis    Asthma    Bilateral chronic knee pain 09/25/2015   Carpal tunnel syndrome    Depression    DEPRESSION, SITUATIONAL, PROLONGED 02/05/2009   Qualifier: Diagnosis of  By: Sherilyn Cooter  MD, Khary     Diabetes mellitus without complication ([place])    Dizziness 04/22/2021   Dyspnea    Elevated blood-pressure reading, without diagnosis of hypertension 06/17/2020   Endometriosis    Fatigue 04/22/2021   History of blood transfusion 1999   w/vaginal delivery   History of bronchitis    "I've stayed in hospital 2 X w/bronchitis"   Hyperlipidemia    Hypertension    Impingement syndrome of left shoulder    Influenza vaccine needed 04/03/2020   Left carpal  tunnel syndrome 07/11/2019   Migraine    Moderate persistent asthma with acute exacerbation 12/06/2006   Qualifier: Diagnosis of  By: Sherilyn Cooter  MD, Khary     Neck pain 10/14/2020   Pneumonia    PONV (postoperative nausea and vomiting)    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   Pre-diabetes 10/03/2019   A1C 6.4   Right carpal tunnel syndrome 07/11/2019   S/P carpal tunnel release 08/30/2019   Seasonal allergies 10/08/2016   Shingles 11/14/2020   Sinusitis, bacterial 10/07/2021    Family History  Problem Relation Age of Onset   Diabetes Mother    Osteoarthritis Mother    Hypertension Mother    Hyperlipidemia Mother    Arthritis Mother     Past Surgical History:  Procedure Laterality Date   ABDOMINAL HYSTERECTOMY  10/17/2019   APPENDECTOMY     CARPAL TUNNEL RELEASE Right 07/19/2019   Procedure: RIGHT CARPAL TUNNEL RELEASE;  Surgeon: Leandrew Koyanagi, MD;  Location: [facility];  Service: Orthopedics;  Laterality: Right;   CARPAL TUNNEL RELEASE Left 09/13/2019   Procedure: LEFT CARPAL TUNNEL RELEASE;  Surgeon: Leandrew Koyanagi, MD;  Location: [facility];  Service: Orthopedics;  Laterality: Left;  Bier block   [place]; 2008   CHOLECYSTECTOMY  2011   SHOULDER ARTHROSCOPY WITH DISTAL CLAVICLE RESECTION  02/12/2021   Procedure: SHOULDER ARTHROSCOPY WITH DISTAL CLAVICLE RESECTION;  Surgeon: Leandrew Koyanagi, MD;  Location: [place];  Service: Orthopedics;;   SHOULDER ARTHROSCOPY WITH ROTATOR CUFF REPAIR AND SUBACROMIAL DECOMPRESSION Left 02/12/2021   Procedure: LEFT SHOULDER ARTHROSCOPY WITH ROTATOR CUFF REPAIR, SUBACROMIAL DECOMPRESSION, EXTENSIVE DEBRIDEMENT;  Surgeon: Leandrew Koyanagi, MD;  Location: [facility];  Service: Orthopedics;  Laterality: Left;   TONSILLECTOMY     tonsilletomy     TUBAL LIGATION  04/2007   ULNAR TUNNEL RELEASE Right 12/17/2021   Procedure: RIGHT CUBITAL TUNNEL RELEASE;  Surgeon: Leandrew Koyanagi, MD;  Location: [facility];  Service: Orthopedics;  Laterality: Right;   VAGINAL HYSTERECTOMY Bilateral 10/17/2019   Procedure: HYSTERECTOMY VAGINAL WITH SALPINGECTOMY;  Surgeon: Chancy Milroy, MD;  Location: [facility];  Service: Gynecology;  Laterality: Bilateral;   Social History   Occupational History   Not on file  Tobacco Use   Smoking status: Never   Smokeless tobacco: Never  Vaping Use   Vaping Use: Never used  Substance and Sexual Activity   Alcohol use: No   Drug use: No   Sexual activity: Yes    Birth control/protection: Surgical

## 2022-04-03 ENCOUNTER — Other Ambulatory Visit (HOSPITAL_COMMUNITY): Payer: Self-pay

## 2022-04-06 ENCOUNTER — Telehealth: Payer: Self-pay | Admitting: Neurology

## 2022-04-06 NOTE — Telephone Encounter (Signed)
Sent mychart msg informing pt of need to reschedule 12/27 appointment - MD out

## 2022-04-07 ENCOUNTER — Encounter: Payer: Self-pay | Admitting: Orthopaedic Surgery

## 2022-04-07 ENCOUNTER — Ambulatory Visit (INDEPENDENT_AMBULATORY_CARE_PROVIDER_SITE_OTHER): Payer: BC Managed Care – PPO | Admitting: Orthopaedic Surgery

## 2022-04-07 ENCOUNTER — Ambulatory Visit: Payer: Self-pay

## 2022-04-07 ENCOUNTER — Ambulatory Visit (INDEPENDENT_AMBULATORY_CARE_PROVIDER_SITE_OTHER): Payer: BC Managed Care – PPO

## 2022-04-07 ENCOUNTER — Telehealth: Payer: Self-pay | Admitting: Neurology

## 2022-04-07 DIAGNOSIS — Z9889 Other specified postprocedural states: Secondary | ICD-10-CM | POA: Diagnosis not present

## 2022-04-07 DIAGNOSIS — M25511 Pain in right shoulder: Secondary | ICD-10-CM | POA: Diagnosis not present

## 2022-04-07 DIAGNOSIS — G8929 Other chronic pain: Secondary | ICD-10-CM

## 2022-04-07 MED ORDER — METHYLPREDNISOLONE ACETATE 40 MG/ML IJ SUSP
40.0000 mg | INTRAMUSCULAR | Status: AC | PRN
  Administered 2022-04-07: 40 mg via INTRA_ARTICULAR

## 2022-04-07 MED ORDER — BUPIVACAINE HCL 0.25 % IJ SOLN
2.0000 mL | INTRAMUSCULAR | Status: AC | PRN
  Administered 2022-04-07: 2 mL via INTRA_ARTICULAR

## 2022-04-07 MED ORDER — LIDOCAINE HCL 1 % IJ SOLN
2.0000 mL | INTRAMUSCULAR | Status: AC | PRN
  Administered 2022-04-07: 2 mL

## 2022-04-07 NOTE — Telephone Encounter (Signed)
Pt was scheduled for her initial CPAP on (05/14/22) Pt was informed to bring machine and power cord to the appointment.   DME in [employer] between dates are 05/01/22-06/30/22

## 2022-04-07 NOTE — Progress Notes (Signed)
Office Visit Note   Patient: Alexandra Norris           Date of Birth: 1975-10-06           MRN: [id number] Visit Date: 01/16/2022              Requested by: Orma Render, NP [address],  [place] [address] PCP: Orma Render, NP   Assessment & Plan: Visit Diagnoses:  1. Cervicalgia   2. Spondylosis without myelopathy or radiculopathy, cervical region   3. Chronic left shoulder pain   4. Bilateral numbness and tingling of arms and legs   5. Paresthesia of skin     Plan: Avoid overhead lifting and overhead use of the arms. Do not lift greater than 5 lbs. Adjust head rest in vehicle to prevent hyperextension if rear ended. Take extra precautions to avoid falling, including use of a cane if you feel weak. Recommend MRI of cervical spine to assess the levels of previous spondylosis for worseing Gabapentin and NSAIDs.   Follow-Up Instructions: No follow-ups on file.   Orders:  No orders of the defined types were placed in this encounter.  No orders of the defined types were placed in this encounter.     Procedures: No procedures performed   Clinical Data: No additional findings.   Subjective: Chief Complaint  Patient presents with   Neck - Pain    46 year old female with history of C5-6 ACDF has degenerative changes above and below area of fusion done for Acute radicular pain and left sided disc herniation C5-6. Options preop were single level surgery vs at least a 3 level  ACDF. In either case she has had bilateral CTR, bilateral shoulder arthroscopy and left cubital tunnel release surgeries and still is having neck and upper extremity complaints of pain primarily left sided. No bowel or bladder difficutly. Last MRI dates to prior to her surgery.     Review of Systems  Constitutional: Negative.   HENT: Negative.    Eyes: Negative.   Respiratory: Negative.    Cardiovascular: Negative.   Gastrointestinal: Negative.    Endocrine: Negative.   Genitourinary: Negative.   Musculoskeletal: Negative.   Skin: Negative.   Allergic/Immunologic: Negative.   Neurological: Negative.   Hematological: Negative.   Psychiatric/Behavioral: Negative.       Objective: Vital Signs: BP 111/65 (BP Location: Left Arm, Patient Position: Sitting)   Pulse 95   Ht '5\' 1"'$  (1.549 m)   Wt 238 lb (108 kg)   LMP 09/20/2019 (Exact Date)   BMI 44.97 kg/m   Physical Exam Constitutional:      Appearance: She is well-developed.  HENT:     Head: Normocephalic and atraumatic.  Eyes:     Pupils: Pupils are equal, round, and reactive to light.  Pulmonary:     Effort: Pulmonary effort is normal.     Breath sounds: Normal breath sounds.  Abdominal:     General: Bowel sounds are normal.     Palpations: Abdomen is soft.  Musculoskeletal:     Cervical back: Normal range of motion and neck supple.     Lumbar back: Negative right straight leg raise test and negative left straight leg raise test.  Skin:    General: Skin is warm and dry.  Neurological:     Mental Status: She is alert and oriented to person, place, and time.  Psychiatric:        Behavior: Behavior normal.  Thought Content: Thought content normal.        Judgment: Judgment normal.     Back Exam   Tenderness  The patient is experiencing tenderness in the cervical.  Range of Motion  Extension:  50 abnormal  Flexion:  60  Lateral bend right:  60  Lateral bend left:  50  Rotation right:  60  Rotation left:  60   Muscle Strength  Right Quadriceps:  5/5  Left Quadriceps:  5/5  Right Hamstrings:  5/5  Left Hamstrings:  5/5   Tests  Straight leg raise right: negative Straight leg raise left: negative  Reflexes  Patellar:  0/4 Achilles:  0/4 Biceps:  abnormal  Comments:  Decrease reflexes left upper extremity.  Painful ROM C spine.       Specialty Comments:  No specialty comments available.  Imaging: XR Shoulder Right  Result  Date: 04/07/2022 No acute or structural abnormalities.  Status post distal clavicle excision.    PMFS History: Patient Active Problem List   Diagnosis Date Noted   Snoring 01/20/2022   Impingement syndrome of right shoulder 12/30/2021   Cubital tunnel syndrome on right 12/02/2021   Asthma due to seasonal allergies 09/25/2021   Cough 07/15/2021   S/P arthroscopy of right shoulder 07/08/2021   Type 1 superior labral anterior-to-posterior (SLAP) tear of right shoulder 04/24/2021   Arthritis of right acromioclavicular joint 04/24/2021   Chronic left shoulder pain 04/22/2021   Hypertension associated with diabetes ([place]) 04/22/2021   Hyperlipidemia associated with type 2 diabetes mellitus ([place]) 04/22/2021   Nontraumatic tear of left supraspinatus tendon 02/12/2021   Encounter to establish care 09/09/2020   Fatty liver disease, nonalcoholic 85/46/2703   Cervical disc disorder at C5-C6 level with radiculopathy 04/03/2020   Arthritis    Nasal obstruction 02/18/2016   DM2 (diabetes mellitus, type 2) ([place]) 10/21/2015   Vitamin D deficiency 09/25/2015   Anxiety and depression 07/06/2014   GASTROESOPHAGEAL REFLUX DISEASE 08/14/2009   Body mass index (BMI) 45.0-49.9, adult ([place]) 05/30/2007   Past Medical History:  Diagnosis Date   Anemia    Anemia    Anxiety    Arthritis    Asthma    Bilateral chronic knee pain 09/25/2015   Carpal tunnel syndrome    Depression    DEPRESSION, SITUATIONAL, PROLONGED 02/05/2009   Qualifier: Diagnosis of  By: Sherilyn Cooter  MD, Khary     Diabetes mellitus without complication ([place])    Dizziness 04/22/2021   Dyspnea    Elevated blood-pressure reading, without diagnosis of hypertension 06/17/2020   Endometriosis    Fatigue 04/22/2021   History of blood transfusion 1999   w/vaginal delivery   History of bronchitis    "I've stayed in hospital 2 X w/bronchitis"   Hyperlipidemia    Hypertension    Impingement syndrome of left shoulder    Influenza vaccine  needed 04/03/2020   Left carpal tunnel syndrome 07/11/2019   Migraine    Moderate persistent asthma with acute exacerbation 12/06/2006   Qualifier: Diagnosis of  By: Sherilyn Cooter  MD, Khary     Neck pain 10/14/2020   Pneumonia    PONV (postoperative nausea and vomiting)    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   Pre-diabetes 10/03/2019   A1C 6.4   Right carpal tunnel syndrome 07/11/2019   S/P carpal tunnel release 08/30/2019   Seasonal allergies 10/08/2016   Shingles 11/14/2020   Sinusitis, bacterial 10/07/2021    Family History  Problem Relation Age of Onset   Diabetes Mother  Osteoarthritis Mother    Hypertension Mother    Hyperlipidemia Mother    Arthritis Mother     Past Surgical History:  Procedure Laterality Date   ABDOMINAL HYSTERECTOMY  10/17/2019   APPENDECTOMY     CARPAL TUNNEL RELEASE Right 07/19/2019   Procedure: RIGHT CARPAL TUNNEL RELEASE;  Surgeon: Leandrew Koyanagi, MD;  Location: [facility];  Service: Orthopedics;  Laterality: Right;   CARPAL TUNNEL RELEASE Left 09/13/2019   Procedure: LEFT CARPAL TUNNEL RELEASE;  Surgeon: Leandrew Koyanagi, MD;  Location: [facility];  Service: Orthopedics;  Laterality: Left;  Bier block   [place]; 2008   CHOLECYSTECTOMY  2011   SHOULDER ARTHROSCOPY WITH DISTAL CLAVICLE RESECTION  02/12/2021   Procedure: SHOULDER ARTHROSCOPY WITH DISTAL CLAVICLE RESECTION;  Surgeon: Leandrew Koyanagi, MD;  Location: [facility];  Service: Orthopedics;;   SHOULDER ARTHROSCOPY WITH ROTATOR CUFF REPAIR AND SUBACROMIAL DECOMPRESSION Left 02/12/2021   Procedure: LEFT SHOULDER ARTHROSCOPY WITH ROTATOR CUFF REPAIR, SUBACROMIAL DECOMPRESSION, EXTENSIVE DEBRIDEMENT;  Surgeon: Leandrew Koyanagi, MD;  Location: [facility];  Service: Orthopedics;  Laterality: Left;   TONSILLECTOMY     tonsilletomy     TUBAL LIGATION  04/2007   ULNAR TUNNEL RELEASE Right 12/17/2021   Procedure: RIGHT CUBITAL TUNNEL RELEASE;   Surgeon: Leandrew Koyanagi, MD;  Location: [facility];  Service: Orthopedics;  Laterality: Right;   VAGINAL HYSTERECTOMY Bilateral 10/17/2019   Procedure: HYSTERECTOMY VAGINAL WITH SALPINGECTOMY;  Surgeon: Chancy Milroy, MD;  Location: [facility];  Service: Gynecology;  Laterality: Bilateral;   Social History   Occupational History   Not on file  Tobacco Use   Smoking status: Never   Smokeless tobacco: Never  Vaping Use   Vaping Use: Never used  Substance and Sexual Activity   Alcohol use: No   Drug use: No   Sexual activity: Yes    Birth control/protection: Surgical

## 2022-04-07 NOTE — Patient Instructions (Signed)
Avoid overhead lifting and overhead use of the arms. Do not lift greater than 5 lbs. Adjust head rest in vehicle to prevent hyperextension if rear ended. Take extra precautions to avoid falling, including use of a cane if you feel weak. Recommend MRI of cervical spine to assess the levels of previous spondylosis for worseing Gabapentin and NSAIDs.

## 2022-04-07 NOTE — Progress Notes (Signed)
Office Visit Note   Patient: Alexandra Norris           Date of Birth: 04-27-1976           MRN: [id number] Visit Date: 04/07/2022              Requested by: Orma Render, NP [address] [place],  [place] [address] PCP: Orma Render, NP   Assessment & Plan: Visit Diagnoses:  1. S/P arthroscopy of right shoulder     Plan: Impression is right shoulder pain with recent injury status post right shoulder surgery about 8 to 9 months ago.  Treatment options were explained and she would like to try a glenohumeral injection first.  We will set this up with Dr. Rolena Infante.  Follow-up if symptoms persist after about 6 weeks or so.  Follow-Up Instructions: No follow-ups on file.   Orders:  Orders Placed This Encounter  Procedures   XR Shoulder Right   No orders of the defined types were placed in this encounter.     Procedures: No procedures performed   Clinical Data: No additional findings.   Subjective: Chief Complaint  Patient presents with   Right Shoulder - Pain    HPI Dove a returns today for right shoulder injury.  Underwent right shoulder scope in January of this year.  She had an injury about 6 weeks ago when she was reaching into the top cabinet and in order to keep from falling off of a stool she caught herself and felt something pop and started having pain afterwards.  Describes the pain as burning and throbbing.  Has used ibuprofen without any significant relief.  Denies any numbness and tingling. Review of Systems   Objective: Vital Signs: LMP 09/20/2019 (Exact Date)   Physical Exam  Ortho Exam Examination of the right shoulder shows full healed surgical scars.  There is no asymmetry compared to the contralateral shoulder girdle.  No obvious Popeye deformity.  She has global pain.  Range of motion is preserved with mild pain.  Manual muscle testing of the rotator cuff is normal. Specialty Comments:  No specialty comments  available.  Imaging: No results found.   PMFS History: Patient Active Problem List   Diagnosis Date Noted   Snoring 01/20/2022   Impingement syndrome of right shoulder 12/30/2021   Cubital tunnel syndrome on right 12/02/2021   Asthma due to seasonal allergies 09/25/2021   Cough 07/15/2021   S/P arthroscopy of right shoulder 07/08/2021   Type 1 superior labral anterior-to-posterior (SLAP) tear of right shoulder 04/24/2021   Arthritis of right acromioclavicular joint 04/24/2021   Chronic left shoulder pain 04/22/2021   Hypertension associated with diabetes ([place]) 04/22/2021   Hyperlipidemia associated with type 2 diabetes mellitus ([place]) 04/22/2021   Nontraumatic tear of left supraspinatus tendon 02/12/2021   Encounter to establish care 09/09/2020   Fatty liver disease, nonalcoholic 30/16/0109   Cervical disc disorder at C5-C6 level with radiculopathy 04/03/2020   Arthritis    Nasal obstruction 02/18/2016   DM2 (diabetes mellitus, type 2) ([place]) 10/21/2015   Vitamin D deficiency 09/25/2015   Anxiety and depression 07/06/2014   GASTROESOPHAGEAL REFLUX DISEASE 08/14/2009   Body mass index (BMI) 45.0-49.9, adult ([place]) 05/30/2007   Past Medical History:  Diagnosis Date   Anemia    Anemia    Anxiety    Arthritis    Asthma    Bilateral chronic knee pain 09/25/2015   Carpal tunnel syndrome    Depression  DEPRESSION, SITUATIONAL, PROLONGED 02/05/2009   Qualifier: Diagnosis of  By: Sherilyn Cooter  MD, Khary     Diabetes mellitus without complication ([place])    Dizziness 04/22/2021   Dyspnea    Elevated blood-pressure reading, without diagnosis of hypertension 06/17/2020   Endometriosis    Fatigue 04/22/2021   History of blood transfusion 1999   w/vaginal delivery   History of bronchitis    "I've stayed in hospital 2 X w/bronchitis"   Hyperlipidemia    Hypertension    Impingement syndrome of left shoulder    Influenza vaccine needed 04/03/2020   Left carpal tunnel syndrome  07/11/2019   Migraine    Moderate persistent asthma with acute exacerbation 12/06/2006   Qualifier: Diagnosis of  By: Sherilyn Cooter  MD, Khary     Neck pain 10/14/2020   Pneumonia    PONV (postoperative nausea and vomiting)    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   Pre-diabetes 10/03/2019   A1C 6.4   Right carpal tunnel syndrome 07/11/2019   S/P carpal tunnel release 08/30/2019   Seasonal allergies 10/08/2016   Shingles 11/14/2020   Sinusitis, bacterial 10/07/2021    Family History  Problem Relation Age of Onset   Diabetes Mother    Osteoarthritis Mother    Hypertension Mother    Hyperlipidemia Mother    Arthritis Mother     Past Surgical History:  Procedure Laterality Date   ABDOMINAL HYSTERECTOMY  10/17/2019   APPENDECTOMY     CARPAL TUNNEL RELEASE Right 07/19/2019   Procedure: RIGHT CARPAL TUNNEL RELEASE;  Surgeon: Leandrew Koyanagi, MD;  Location: [facility];  Service: Orthopedics;  Laterality: Right;   CARPAL TUNNEL RELEASE Left 09/13/2019   Procedure: LEFT CARPAL TUNNEL RELEASE;  Surgeon: Leandrew Koyanagi, MD;  Location: [facility];  Service: Orthopedics;  Laterality: Left;  Bier block   [place]; 2008   CHOLECYSTECTOMY  2011   SHOULDER ARTHROSCOPY WITH DISTAL CLAVICLE RESECTION  02/12/2021   Procedure: SHOULDER ARTHROSCOPY WITH DISTAL CLAVICLE RESECTION;  Surgeon: Leandrew Koyanagi, MD;  Location: [facility];  Service: Orthopedics;;   SHOULDER ARTHROSCOPY WITH ROTATOR CUFF REPAIR AND SUBACROMIAL DECOMPRESSION Left 02/12/2021   Procedure: LEFT SHOULDER ARTHROSCOPY WITH ROTATOR CUFF REPAIR, SUBACROMIAL DECOMPRESSION, EXTENSIVE DEBRIDEMENT;  Surgeon: Leandrew Koyanagi, MD;  Location: [facility];  Service: Orthopedics;  Laterality: Left;   TONSILLECTOMY     tonsilletomy     TUBAL LIGATION  04/2007   ULNAR TUNNEL RELEASE Right 12/17/2021   Procedure: RIGHT CUBITAL TUNNEL RELEASE;  Surgeon: Leandrew Koyanagi, MD;  Location: [facility];  Service: Orthopedics;  Laterality: Right;   VAGINAL HYSTERECTOMY Bilateral 10/17/2019   Procedure: HYSTERECTOMY VAGINAL WITH SALPINGECTOMY;  Surgeon: Chancy Milroy, MD;  Location: [facility];  Service: Gynecology;  Laterality: Bilateral;   Social History   Occupational History   Not on file  Tobacco Use   Smoking status: Never   Smokeless tobacco: Never  Vaping Use   Vaping Use: Never used  Substance and Sexual Activity   Alcohol use: No   Drug use: No   Sexual activity: Yes    Birth control/protection: Surgical

## 2022-04-07 NOTE — Progress Notes (Signed)
   Procedure Note  Patient: Alexandra Norris             Date of Birth: Sep 24, 1975           MRN: [id number]             Visit Date: 04/07/2022  Procedures: Visit Diagnoses:  1. S/P arthroscopy of right shoulder     Large Joint Inj: R glenohumeral on 04/07/2022 10:31 AM Indications: pain Details: 22 G 3.5 in needle, ultrasound-guided posterior approach Medications: 2 mL lidocaine 1 %; 2 mL bupivacaine 0.25 %; 40 mg methylPREDNISolone acetate 40 MG/ML Outcome: tolerated well, no immediate complications  US-guided glenohumeral joint injection, right shoulder After discussion on risks/benefits/indications, informed verbal consent was obtained. A timeout was then performed. The patient was positioned lying lateral recumbent on examination table. The patient's shoulder was prepped with betadine and multiple alcohol swabs and utilizing ultrasound guidance, the patient's glenohumeral joint was identified on ultrasound. Using ultrasound guidance a 22-gauge, 3.5 inch needle with a mixture of 2:2:1 cc's lidocaine:bupivicaine:depomedrol was directed from a lateral to medial direction via in-plane technique into the glenohumeral joint with visualization of appropriate spread of injectate into the joint. Patient tolerated the procedure well without immediate complications.      Procedure, treatment alternatives, risks and benefits explained, specific risks discussed. Consent was given by the patient. Immediately prior to procedure a time out was called to verify the correct patient, procedure, equipment, support staff and site/side marked as required. Patient was prepped and draped in the usual sterile fashion.     - I evaluated the patient about 10 minutes post-injection and she had improvement in pain and range of motion - she said she had "no pain" following injection - follow-up with Dr. Erlinda Hong as indicated; I am happy to see them as needed  Elba Barman, DO [place]  This note was dictated using Dragon naturally speaking software and may contain errors in syntax, spelling, or content which have not been identified prior to signing this note.

## 2022-04-11 ENCOUNTER — Encounter: Payer: Self-pay | Admitting: Orthopaedic Surgery

## 2022-04-13 ENCOUNTER — Other Ambulatory Visit: Payer: Self-pay

## 2022-04-13 DIAGNOSIS — G8929 Other chronic pain: Secondary | ICD-10-CM

## 2022-04-13 DIAGNOSIS — Z9889 Other specified postprocedural states: Secondary | ICD-10-CM

## 2022-04-13 NOTE — Telephone Encounter (Signed)
Please order MRI of the shoulder please.  Thanks.

## 2022-04-22 ENCOUNTER — Other Ambulatory Visit: Payer: BC Managed Care – PPO

## 2022-04-29 ENCOUNTER — Ambulatory Visit (INDEPENDENT_AMBULATORY_CARE_PROVIDER_SITE_OTHER): Payer: Self-pay | Admitting: Orthopedic Surgery

## 2022-04-29 ENCOUNTER — Encounter: Payer: Self-pay | Admitting: Orthopedic Surgery

## 2022-04-29 ENCOUNTER — Ambulatory Visit (INDEPENDENT_AMBULATORY_CARE_PROVIDER_SITE_OTHER): Payer: Self-pay

## 2022-04-29 VITALS — BP 103/70 | HR 93 | Ht 61.0 in | Wt 239.0 lb

## 2022-04-29 DIAGNOSIS — M542 Cervicalgia: Secondary | ICD-10-CM

## 2022-04-29 NOTE — Progress Notes (Signed)
Orthopedic Spine Surgery Office Note  Assessment: Patient is a 46 y.o. female with history of C5-6 ACDF done by [facility] who comes in with axial neck pain.  She also has right shoulder pain but this seems to be coming from the shoulder and not radiculopathy.  Her pain is worse with overhead activity and she has pain with range of motion through the shoulder on exam.    Plan: -Discussed that she does have facet arthropathy and adjacent segment degeneration but does not have any significant radiculopathy or myelopathy.  Currently, did not recommend surgical management at this time.  Explained that should she develop symptoms of radiculopathy or myelopathy, surgical management could provide her benefit. -Explained that initially conservative treatment is tried as a significant number of patients may experience relief with these treatment modalities. Discussed that the conservative treatments include:  -activity modification  -physical therapy  -over the counter pain medications  -medrol dosepak  -cervical steroid injections -Patient has tried physical therapy, oral steroids, steroid injection -After discussing the above options, she wanted to proceed with pain management.  A referral was provided to her today. -Patient should return to office on an as-needed basis   Patient expressed understanding of the plan and all questions were answered to the patient's satisfaction.   ___________________________________________________________________________   History:  Patient is a 46 y.o. female who presents today for cervical spine.  Patient has a history of a C5-6 ACDF done by [facility] in 2021.  She states that she got relief of her radiating arm pain but had persistent neck pain after the surgery.  She said she got maybe a little bit of neck pain relief with the surgery.  She has had neck pain for 3 years.  There is no trauma or injury that brought on the pain.  Currently, she has right shoulder pain.  She  does not really have left shoulder pain.  She has no numbness or tingling.  Her shoulder pain is worse with overhead activities and with her work.  She cleans houses for living.  Improves with rest.  She has not noticed any weakness.   Weakness: Denies Difficulty with fine motor skills (e.g., buttoning shirts, handwriting): Denies Symptoms of imbalance: Denies Paresthesias and numbness: Denies Bowel or bladder incontinence: Denies Saddle anesthesia: Denies  Treatments tried: Activity modification, physical therapy, oral steroids, steroid injection  Review of systems: Denies fevers and chills, night sweats, unexplained weight loss, history of cancer, pain that wakes them at night  Past medical history: Hyperlipidemia Migraines Anxiety Diabetes  Allergies: NKDA  Past surgical history:  Bilateral carpal tunnel release Bilateral shoulder arthroscopy Right cubital tunnel release Tonsillectomy Appendectomy Hysterectomy Tubal ligation  Social history: Denies use of nicotine product (smoking, vaping, patches, smokeless) Alcohol use: Denies Denies recreational drug use  Physical Exam:  General: no acute distress, appears stated age Neurologic: alert, answering questions appropriately, following commands Respiratory: unlabored breathing on room air, symmetric chest rise Psychiatric: appropriate affect, normal cadence to speech   MSK (spine):  -Strength exam      Left  Right Grip strength                5/5  5/5 Interosseus   5/5   5/5 Wrist extension  5/5  5/5 Wrist flexion   5/5  5/5 Elbow flexion   5/5  5/5 Deltoid    5/5  5/5  EHL    5/5  5/5 TA    5/5  5/5 GSC    5/5  5/5 Knee extension  5/5  5/5 Hip flexion   5/5  5/5  -Sensory exam    Sensation intact to light touch in L3-S1 nerve distributions of bilateral lower extremities  Sensation intact to light touch in C5-T1 nerve distributions of bilateral upper extremities  -Brachioradialis DTR: 2/4 on the  left, 2/4 on the right -Biceps DTR: 2/4 on the left, 2/4 on the right -Achilles DTR: 2/4 on the left, 2/4 on the right -Patellar tendon DTR: 2/4 on the left, 2/4 on the right  -Spurling: Negative bilaterally -Negative shoulder abduction relief sign -Hoffman sign: Negative bilaterally -Clonus: No beats bilaterally -Interosseous wasting: None seen -Grip and release test: Negative -Romberg: Negative -Gait: Normal -Imbalance with tandem gait: No  Left shoulder exam: No pain through range of motion Right shoulder exam: Pain with internal and external rotation greater than 45 degrees in either direction.  Pain with Jobe test.  Negative belly press.  Negative lift off.  No weakness with external rotation with arm at side.  Tinel's at wrist: Negative bilaterally Durkan's: Negative bilaterally  Tinel's at elbow: Negative bilaterally  Imaging: XR of the cervical spine from 11/21/2021 and 04/29/2022 was independently reviewed and interpreted, showing no fracture or dislocation C5/6 ACDF with anterior instrumentation. No lucency around the screws. Screws not backing out. No fusion mass seen within the interbody cage. Interspinous motion <67m between flexion and extension. No evidence of instability on flexion/extension. Adjacent segment degeneration at C4/5 and C6/7.   MRI of the cervical spine from 01/28/2022 was independently reviewed and interpreted, showing disc bulge at C6/7. Left paracentral disc herniation at C4/5. No significant foraminal or central stenosis. No T2 cord signal change. C5/6 anterior fixation in place with no evidence of complication (though MRI is not a good modality to tell if there is a instrumentation or fusion complication)    Patient name: Alexandra BrusterPatient MRN: [id number] of visit: 04/29/22

## 2022-05-06 ENCOUNTER — Encounter: Payer: Self-pay | Admitting: Nurse Practitioner

## 2022-05-06 ENCOUNTER — Other Ambulatory Visit: Payer: Self-pay | Admitting: Nurse Practitioner

## 2022-05-06 DIAGNOSIS — K760 Fatty (change of) liver, not elsewhere classified: Secondary | ICD-10-CM

## 2022-05-06 DIAGNOSIS — E1165 Type 2 diabetes mellitus with hyperglycemia: Secondary | ICD-10-CM

## 2022-05-06 DIAGNOSIS — E1159 Type 2 diabetes mellitus with other circulatory complications: Secondary | ICD-10-CM

## 2022-05-06 DIAGNOSIS — E1169 Type 2 diabetes mellitus with other specified complication: Secondary | ICD-10-CM

## 2022-05-07 ENCOUNTER — Ambulatory Visit: Payer: BC Managed Care – PPO | Admitting: Orthopedic Surgery

## 2022-05-13 NOTE — Patient Instructions (Incomplete)
Please continue using your CPAP regularly. While your insurance requires that you use CPAP at least 4 hours each night on 70% of the nights, I recommend, that you not skip any nights and use it throughout the night if you can. Getting used to CPAP and staying with the treatment long term does take time and patience and discipline. Untreated obstructive sleep apnea when it is moderate to severe can have an adverse impact on cardiovascular health and raise her risk for heart disease, arrhythmias, hypertension, congestive heart failure, stroke and diabetes. Untreated obstructive sleep apnea causes sleep disruption, nonrestorative sleep, and sleep deprivation. This can have an impact on your day to day functioning and cause daytime sleepiness and impairment of cognitive function, memory loss, mood disturbance, and problems focussing. Using CPAP regularly can improve these symptoms.   Follow up in 4 months

## 2022-05-13 NOTE — Progress Notes (Deleted)
PATIENT: Alexandra Norris DOB: 1976-04-09  REASON FOR VISIT: follow up HISTORY FROM: patient  No chief complaint on file.    HISTORY OF PRESENT ILLNESS:  05/13/22 ALL:  [name] is a 46 y.o. female here today for follow up for OSA on CPAP. She was seen in consult with Dr Rexene Alberts 02/2022 for snoring, daytime sleepiness and witnessed apnea. HST 02/2022 showed moderate OSA with AHI 16/h. AutoPAP was ordered. Since, she has had difficulty with compliance.      HISTORY: (copied from Dr Guadelupe Sabin previous note)  Dear Alexandra Norris,    I saw your patient, Alexandra Norris, upon your kind request in my sleep clinic today for initial consultation of her sleep disorder, in particular, concern for underlying obstructive sleep apnea.  The patient is accompanied by her husband and a Spanish interpreter today.  As you know, [name] is a 46 year old right-handed woman with medical history of diabetes, history of shingles, neck pain, migraine headaches, carpal tunnel syndrome, hypertension, hyperlipidemia, anxiety, depression, arthritis, asthma, anemia, fatty liver disease, and severe obesity with a BMI of over 40, who reports snoring and excessive daytime somnolence as well as witnessed apneas per husband's report.  I reviewed your office note from 12/16/2021.  Her Epworth sleepiness score is 5 fatigue severity score is 40 out of 63.  She does not wake up rested, she feels tired during the day but does not fall asleep advertently typically.  She has trouble going to sleep and staying asleep.  In the past she tried gabapentin for a few nights but had side effects during the day so she stopped it.  She does not take any over-the-counter medication for sleep.  She goes to bed generally around 8:30 PM or 9 PM but may not be asleep until 1 AM.  She has a rise time of around 5:30 AM or 6 AM.  She has nocturia once per average night and has occasional morning  headaches which are generalized.  She has gained weight over the past 3 years in the realm of 50 pounds.  She is working on weight loss now.  She had multiple surgeries in the past 3 years.  She lives with her husband and her 2 sons as well as her daughter-in-law.  They have 1 cat in the household.  She had a tonsillectomy some 6 years ago.  She has no family history of sleep apnea.  She currently works from home.  She does not smoke and drinks alcohol very rarely, no daily caffeine.  REVIEW OF SYSTEMS: Out of a complete 14 system review of symptoms, the patient complains only of the following symptoms, and all other reviewed systems are negative.  ESS:  ALLERGIES: No Known Allergies  HOME MEDICATIONS: Outpatient Medications Prior to Visit  Medication Sig Dispense Refill   albuterol (VENTOLIN HFA) 108 (90 Base) MCG/ACT inhaler Inhale 2 puffs into the lungs in the morning and at bedtime. 18 g 2   atorvastatin (LIPITOR) 20 MG tablet Take 1 tablet (20 mg total) by mouth at bedtime. 90 tablet 3   Dulaglutide (TRULICITY) 3 DD/2.2GU SOPN Inject 3 mg as directed once a week. 2 mL 11   fluticasone (FLONASE) 50 MCG/ACT nasal spray Place 2 sprays into both nostrils daily for allergies. 16 g 6   gabapentin (NEURONTIN) 100 MG capsule Take 1 capsule (100 mg total) by mouth at bedtime. 90 capsule 3   hydrOXYzine (ATARAX) 50 MG tablet Take 1 tablet (50 mg  total) by mouth at bedtime and may repeat dose one time if needed for anxiety and sleep. 60 tablet 11   lisinopril (ZESTRIL) 10 MG tablet Take 1 tablet (10 mg total) by mouth daily. 90 tablet 3   metFORMIN (GLUCOPHAGE) 500 MG tablet Take 1 tablet (500 mg total) by mouth daily with breakfast AND 2 tablets (1,000 mg total) with dinner 270 tablet 3   Multiple Vitamin (MULTIVITAMIN WITH MINERALS) TABS tablet Take 1 tablet by mouth daily in the afternoon.     ondansetron (ZOFRAN) 4 MG tablet Take 1-2 tablets (4-8 mg total) by mouth every 8 (eight) hours as needed  for nausea or vomiting. 20 tablet 0   sertraline (ZOLOFT) 50 MG tablet Take 1 tablet (50 mg total) by mouth daily. 90 tablet 3   Spacer/Aero-Holding Chambers (AEROCHAMBER PLUS FLO-VU LARGE) MISC See admin instructions.     traMADol-acetaminophen (ULTRACET) 37.5-325 MG tablet Take 1 tablet by mouth every 6 (six) hours as needed. 30 tablet 0   No facility-administered medications prior to visit.    PAST MEDICAL HISTORY: Past Medical History:  Diagnosis Date   Anemia    Anemia    Anxiety    Arthritis    Asthma    Bilateral chronic knee pain 09/25/2015   Carpal tunnel syndrome    Depression    DEPRESSION, SITUATIONAL, PROLONGED 02/05/2009   Qualifier: Diagnosis of  By: Sherilyn Cooter  MD, Khary     Diabetes mellitus without complication ([place])    Dizziness 04/22/2021   Dyspnea    Elevated blood-pressure reading, without diagnosis of hypertension 06/17/2020   Endometriosis    Fatigue 04/22/2021   History of blood transfusion 1999   w/vaginal delivery   History of bronchitis    "I've stayed in hospital 2 X w/bronchitis"   Hyperlipidemia    Hypertension    Impingement syndrome of left shoulder    Influenza vaccine needed 04/03/2020   Left carpal tunnel syndrome 07/11/2019   Migraine    Moderate persistent asthma with acute exacerbation 12/06/2006   Qualifier: Diagnosis of  By: Sherilyn Cooter  MD, Khary     Neck pain 10/14/2020   Pneumonia    PONV (postoperative nausea and vomiting)    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   Pre-diabetes 10/03/2019   A1C 6.4   Right carpal tunnel syndrome 07/11/2019   S/P carpal tunnel release 08/30/2019   Seasonal allergies 10/08/2016   Shingles 11/14/2020   Sinusitis, bacterial 10/07/2021    PAST SURGICAL HISTORY: Past Surgical History:  Procedure Laterality Date   ABDOMINAL HYSTERECTOMY  10/17/2019   APPENDECTOMY     CARPAL TUNNEL RELEASE Right 07/19/2019   Procedure: RIGHT CARPAL TUNNEL RELEASE;  Surgeon: Leandrew Koyanagi, MD;  Location: [facility];  Service: Orthopedics;  Laterality: Right;   CARPAL TUNNEL RELEASE Left 09/13/2019   Procedure: LEFT CARPAL TUNNEL RELEASE;  Surgeon: Leandrew Koyanagi, MD;  Location: [facility];  Service: Orthopedics;  Laterality: Left;  Bier block   [place]; 2008   CHOLECYSTECTOMY  2011   SHOULDER ARTHROSCOPY WITH DISTAL CLAVICLE RESECTION  02/12/2021   Procedure: SHOULDER ARTHROSCOPY WITH DISTAL CLAVICLE RESECTION;  Surgeon: Leandrew Koyanagi, MD;  Location: [facility];  Service: Orthopedics;;   SHOULDER ARTHROSCOPY WITH ROTATOR CUFF REPAIR AND SUBACROMIAL DECOMPRESSION Left 02/12/2021   Procedure: LEFT SHOULDER ARTHROSCOPY WITH ROTATOR CUFF REPAIR, SUBACROMIAL DECOMPRESSION, EXTENSIVE DEBRIDEMENT;  Surgeon: Leandrew Koyanagi, MD;  Location: [facility];  Service: Orthopedics;  Laterality: Left;   TONSILLECTOMY     tonsilletomy     TUBAL LIGATION  04/2007   ULNAR TUNNEL RELEASE Right 12/17/2021   Procedure: RIGHT CUBITAL TUNNEL RELEASE;  Surgeon: Leandrew Koyanagi, MD;  Location: [facility];  Service: Orthopedics;  Laterality: Right;   VAGINAL HYSTERECTOMY Bilateral 10/17/2019   Procedure: HYSTERECTOMY VAGINAL WITH SALPINGECTOMY;  Surgeon: Chancy Milroy, MD;  Location: [facility];  Service: Gynecology;  Laterality: Bilateral;    FAMILY HISTORY: Family History  Problem Relation Age of Onset   Diabetes Mother    Osteoarthritis Mother    Hypertension Mother    Hyperlipidemia Mother    Arthritis Mother     SOCIAL HISTORY: Social History   Socioeconomic History   Marital status: Married    Spouse name: Not on file   Number of children: 3   Years of education: Not on file   Highest education level: 12th grade  Occupational History   Not on file  Tobacco Use   Smoking status: Never   Smokeless tobacco: Never  Vaping Use   Vaping Use: Never used  Substance and Sexual Activity   Alcohol use: No   Drug use: No   Sexual activity:  Yes    Birth control/protection: Surgical  Other Topics Concern   Not on file  Social History Narrative   Caffeine none.     Education: 6 th grade.    Work:  remote    Scientist, physiological Strain: Not on file  Food Insecurity: Food Insecurity Present (11/21/2019)   Hunger Vital Sign    Worried About Running Out of Food in the Last Year: Sometimes true    Ran Out of Food in the Last Year: Sometimes true  Transportation Needs: No Transportation Needs (11/21/2019)   PRAPARE - Hydrologist (Medical): No    Lack of Transportation (Non-Medical): No  Physical Activity: Not on file  Stress: Not on file  Social Connections: Not on file  Intimate Partner Violence: Not on file     PHYSICAL EXAM  There were no vitals filed for this visit. There is no height or weight on file to calculate BMI.  Generalized: Well developed, in no acute distress  Cardiology: normal rate and rhythm, no murmur noted Respiratory: clear to auscultation bilaterally  Neurological examination  Mentation: Alert oriented to time, place, history taking. Follows all commands speech and language fluent Cranial nerve II-XII: Pupils were equal round reactive to light. Extraocular movements were full, visual field were full  Motor: The motor testing reveals 5 over 5 strength of all 4 extremities. Good symmetric motor tone is noted throughout.  Gait and station: Gait is normal.    DIAGNOSTIC DATA (LABS, IMAGING, TESTING) - I reviewed patient records, labs, notes, testing and imaging myself where available.      No data to display           Lab Results  Component Value Date   WBC 9.6 12/16/2021   HGB 13.3 12/16/2021   HCT 41.4 12/16/2021   MCV 82 12/16/2021   PLT 279 12/16/2021      Component Value Date/Time   NA 139 12/16/2021 1150   K 4.5 12/16/2021 1150   CL 102 12/16/2021 1150   CO2 22 12/16/2021 1150   GLUCOSE 122 (H) 12/16/2021 1150    GLUCOSE 265 (H) 12/03/2021 1300   BUN 6 12/16/2021 1150   CREATININE 0.56 (L) 12/16/2021  1150   CREATININE 0.52 01/03/2013 1603   CALCIUM 8.9 12/16/2021 1150   PROT 7.4 12/16/2021 1150   ALBUMIN 4.3 12/16/2021 1150   AST 94 (H) 12/16/2021 1150   ALT 92 (H) 12/16/2021 1150   ALKPHOS 147 (H) 12/16/2021 1150   BILITOT 0.6 12/16/2021 1150   GFRNONAA >60 12/03/2021 1300   GFRNONAA >89 01/03/2013 1603   GFRAA >60 10/18/2019 0149   GFRAA >89 01/03/2013 1603   Lab Results  Component Value Date   CHOL 211 (H) 08/18/2021   HDL 46 08/18/2021   LDLCALC 131 (H) 08/18/2021   TRIG 189 (H) 08/18/2021   CHOLHDL 4.6 (H) 08/18/2021   Lab Results  Component Value Date   HGBA1C 8.4 (H) 12/16/2021   No results found for: "VITAMINB12" Lab Results  Component Value Date   TSH 2.540 08/18/2021     ASSESSMENT AND PLAN 46 y.o. year old female  has a past medical history of Anemia, Anemia, Anxiety, Arthritis, Asthma, Bilateral chronic knee pain (09/25/2015), Carpal tunnel syndrome, Depression, DEPRESSION, SITUATIONAL, PROLONGED (02/05/2009), Diabetes mellitus without complication ([place]), Dizziness (04/22/2021), Dyspnea, Elevated blood-pressure reading, without diagnosis of hypertension (06/17/2020), Endometriosis, Fatigue (04/22/2021), History of blood transfusion (1999), History of bronchitis, Hyperlipidemia, Hypertension, Impingement syndrome of left shoulder, Influenza vaccine needed (04/03/2020), Left carpal tunnel syndrome (07/11/2019), Migraine, Moderate persistent asthma with acute exacerbation (12/06/2006), Neck pain (10/14/2020), Pneumonia, PONV (postoperative nausea and vomiting), Post-herpetic polyneuropathy (11/14/2020), Pre-diabetes (10/03/2019), Right carpal tunnel syndrome (07/11/2019), S/P carpal tunnel release (08/30/2019), Seasonal allergies (10/08/2016), Shingles (11/14/2020), and Sinusitis, bacterial (10/07/2021). here with     ICD-10-CM   1. OSA on CPAP  G47.33         Lloyd Cullinan is doing well on CPAP therapy. Compliance report reveals ***. *** was encouraged to continue using CPAP nightly and for greater than 4 hours each night. We will update supply orders as indicated. Risks of untreated sleep apnea review and education materials provided. Healthy lifestyle habits encouraged. *** will follow up in ***, sooner if needed. *** verbalizes understanding and agreement with this plan.    No orders of the defined types were placed in this encounter.    No orders of the defined types were placed in this encounter.     Debbora Presto, FNP-C 05/13/2022, 4:11 PM [facility] [address] [place], [place] [address] [phone]

## 2022-05-14 ENCOUNTER — Ambulatory Visit: Payer: Self-pay | Admitting: Family Medicine

## 2022-05-14 ENCOUNTER — Telehealth: Payer: Self-pay | Admitting: Family Medicine

## 2022-05-14 DIAGNOSIS — G4733 Obstructive sleep apnea (adult) (pediatric): Secondary | ICD-10-CM

## 2022-05-14 NOTE — Telephone Encounter (Signed)
Pt cancelled appt due to scheduling conflict 

## 2022-05-17 ENCOUNTER — Ambulatory Visit
Admission: RE | Admit: 2022-05-17 | Discharge: 2022-05-17 | Disposition: A | Discharge status: Home / Self Care | Payer: Self-pay | Source: Ambulatory Visit | Attending: Orthopaedic Surgery | Admitting: Orthopaedic Surgery

## 2022-05-17 DIAGNOSIS — G8929 Other chronic pain: Secondary | ICD-10-CM

## 2022-05-20 ENCOUNTER — Encounter: Payer: Self-pay | Admitting: Physical Medicine & Rehabilitation

## 2022-05-26 ENCOUNTER — Other Ambulatory Visit (HOSPITAL_COMMUNITY): Payer: Self-pay

## 2022-05-26 ENCOUNTER — Ambulatory Visit (INDEPENDENT_AMBULATORY_CARE_PROVIDER_SITE_OTHER): Payer: Self-pay | Admitting: Orthopaedic Surgery

## 2022-05-26 ENCOUNTER — Encounter: Payer: Self-pay | Admitting: Orthopaedic Surgery

## 2022-05-26 DIAGNOSIS — G8929 Other chronic pain: Secondary | ICD-10-CM

## 2022-05-26 DIAGNOSIS — M25511 Pain in right shoulder: Secondary | ICD-10-CM

## 2022-05-26 MED ORDER — MELOXICAM 7.5 MG PO TABS
7.5000 mg | ORAL_TABLET | Freq: Two times a day (BID) | ORAL | 2 refills | Status: DC | PRN
  Filled 2022-05-26: qty 30, 15d supply, fill #0

## 2022-05-26 NOTE — Addendum Note (Signed)
Addended by: Lendon Collar on: 05/26/2022 03:56 PM   Modules accepted: Orders

## 2022-05-26 NOTE — Progress Notes (Signed)
Office Visit Note   Patient: Alexandra Norris           Date of Birth: Dec 28, 1975           MRN: [id number] Visit Date: 05/26/2022              Requested by: Alexandra Render, NP [place],  [place] [address] PCP: Alexandra Render, NP   Assessment & Plan: Visit Diagnoses:  1. Chronic right shoulder pain     Plan: MRI shows tendinosis of the supraspinatus with a small partial-thickness tear.  She does have edema in the distal clavicle status post prior distal clavicle excision.  These findings were reviewed with the patient.  Patient seems to be in generalized shoulder pain and difficult to really narrow down to which specific MRI finding is a source of pain.  I recommend taking an anti-inflammatory and try an ultrasound-guided [facility] joint injection.  We will order 1 with Dr. Rolena Infante.  Patient will follow-up if symptoms persist.  Interpreter present for the entire encounter today.  Follow-Up Instructions: No follow-ups on file.   Orders:  No orders of the defined types were placed in this encounter.  Meds ordered this encounter  Medications   meloxicam (MOBIC) 7.5 MG tablet    Sig: Take 1 tablet (7.5 mg total) by mouth 2 (two) times daily as needed for pain.    Dispense:  30 tablet    Refill:  2      Procedures: No procedures performed   Clinical Data: No additional findings.   Subjective: Chief Complaint  Patient presents with   Right Shoulder - Follow-up    MRI review    HPI Alexandra Norris a returns today to discuss right shoulder MRI scan.  Interpreter present.  Reports she continues to have right shoulder pain.  Review of Systems  Constitutional: Negative.   HENT: Negative.    Eyes: Negative.   Respiratory: Negative.    Cardiovascular: Negative.   Endocrine: Negative.   Musculoskeletal: Negative.   Neurological: Negative.   Hematological: Negative.   Psychiatric/Behavioral: Negative.    All other systems reviewed and are  negative.    Objective: Vital Signs: LMP 09/20/2019 (Exact Date)   Physical Exam Vitals and nursing note reviewed.  Constitutional:      Appearance: She is well-developed.  HENT:     Head: Normocephalic and atraumatic.  Pulmonary:     Effort: Pulmonary effort is normal.  Abdominal:     Palpations: Abdomen is soft.  Musculoskeletal:     Cervical back: Neck supple.  Skin:    General: Skin is warm.     Capillary Refill: Capillary refill takes less than 2 seconds.  Neurological:     Mental Status: She is alert and oriented to person, place, and time.  Psychiatric:        Behavior: Behavior normal.        Thought Content: Thought content normal.        Judgment: Judgment normal.     Ortho Exam Examination right shoulder shows tenderness of the AC joint.  She guards to range of motion.  Manual muscle testing of the rotator cuff shows pain without weakness.  Specialty Comments:  No specialty comments available.  Imaging: No results found.   PMFS History: Patient Active Problem List   Diagnosis Date Noted   Cervical post-laminectomy syndrome 03/06/2022   Cervical radiculopathy 03/05/2022   Snoring 01/20/2022   Impingement syndrome of right shoulder 12/30/2021  Cubital tunnel syndrome on right 12/02/2021   Asthma due to seasonal allergies 09/25/2021   Cough 07/15/2021   S/P arthroscopy of right shoulder 07/08/2021   Type 1 superior labral anterior-to-posterior (SLAP) tear of right shoulder 04/24/2021   Arthritis of right acromioclavicular joint 04/24/2021   Chronic left shoulder pain 04/22/2021   Hypertension associated with diabetes ([place]) 04/22/2021   Hyperlipidemia associated with type 2 diabetes mellitus ([place]) 04/22/2021   Nontraumatic tear of left supraspinatus tendon 02/12/2021   Encounter to establish care 09/09/2020   Fatty liver disease, nonalcoholic 54/02/8118   Cervical disc disorder at C5-C6 level with radiculopathy 04/03/2020   Arthritis    Nasal  obstruction 02/18/2016   DM2 (diabetes mellitus, type 2) ([place]) 10/21/2015   Vitamin D deficiency 09/25/2015   Anxiety and depression 07/06/2014   GASTROESOPHAGEAL REFLUX DISEASE 08/14/2009   Body mass index (BMI) 45.0-49.9, adult ([place]) 05/30/2007   Past Medical History:  Diagnosis Date   Anemia    Anemia    Anxiety    Arthritis    Asthma    Bilateral chronic knee pain 09/25/2015   Carpal tunnel syndrome    Depression    DEPRESSION, SITUATIONAL, PROLONGED 02/05/2009   Qualifier: Diagnosis of  By: Sherilyn Cooter  MD, Khary     Diabetes mellitus without complication ([place])    Dizziness 04/22/2021   Dyspnea    Elevated blood-pressure reading, without diagnosis of hypertension 06/17/2020   Endometriosis    Fatigue 04/22/2021   History of blood transfusion 1999   w/vaginal delivery   History of bronchitis    "I've stayed in hospital 2 X w/bronchitis"   Hyperlipidemia    Hypertension    Impingement syndrome of left shoulder    Influenza vaccine needed 04/03/2020   Left carpal tunnel syndrome 07/11/2019   Migraine    Moderate persistent asthma with acute exacerbation 12/06/2006   Qualifier: Diagnosis of  By: Sherilyn Cooter  MD, Khary     Neck pain 10/14/2020   Pneumonia    PONV (postoperative nausea and vomiting)    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   Pre-diabetes 10/03/2019   A1C 6.4   Right carpal tunnel syndrome 07/11/2019   S/P carpal tunnel release 08/30/2019   Seasonal allergies 10/08/2016   Shingles 11/14/2020   Sinusitis, bacterial 10/07/2021    Family History  Problem Relation Age of Onset   Diabetes Mother    Osteoarthritis Mother    Hypertension Mother    Hyperlipidemia Mother    Arthritis Mother     Past Surgical History:  Procedure Laterality Date   ABDOMINAL HYSTERECTOMY  10/17/2019   APPENDECTOMY     CARPAL TUNNEL RELEASE Right 07/19/2019   Procedure: RIGHT CARPAL TUNNEL RELEASE;  Surgeon: Leandrew Koyanagi, MD;  Location: [place] [facility];  Service:  Orthopedics;  Laterality: Right;   CARPAL TUNNEL RELEASE Left 09/13/2019   Procedure: LEFT CARPAL TUNNEL RELEASE;  Surgeon: Leandrew Koyanagi, MD;  Location: [place];  Service: Orthopedics;  Laterality: Left;  Bier block   [place]; 2008   CHOLECYSTECTOMY  2011   SHOULDER ARTHROSCOPY WITH DISTAL CLAVICLE RESECTION  02/12/2021   Procedure: SHOULDER ARTHROSCOPY WITH DISTAL CLAVICLE RESECTION;  Surgeon: Leandrew Koyanagi, MD;  Location: [place];  Service: Orthopedics;;   SHOULDER ARTHROSCOPY WITH ROTATOR CUFF REPAIR AND SUBACROMIAL DECOMPRESSION Left 02/12/2021   Procedure: LEFT SHOULDER ARTHROSCOPY WITH ROTATOR CUFF REPAIR, SUBACROMIAL DECOMPRESSION, EXTENSIVE DEBRIDEMENT;  Surgeon: Leandrew Koyanagi, MD;  Location: [facility];  Service: Orthopedics;  Laterality: Left;   TONSILLECTOMY     tonsilletomy     TUBAL LIGATION  04/2007   ULNAR TUNNEL RELEASE Right 12/17/2021   Procedure: RIGHT CUBITAL TUNNEL RELEASE;  Surgeon: Leandrew Koyanagi, MD;  Location: [place];  Service: Orthopedics;  Laterality: Right;   VAGINAL HYSTERECTOMY Bilateral 10/17/2019   Procedure: HYSTERECTOMY VAGINAL WITH SALPINGECTOMY;  Surgeon: Chancy Milroy, MD;  Location: [employer] [facility];  Service: Gynecology;  Laterality: Bilateral;   Social History   Occupational History   Not on file  Tobacco Use   Smoking status: Never   Smokeless tobacco: Never  Vaping Use   Vaping Use: Never used  Substance and Sexual Activity   Alcohol use: No   Drug use: No   Sexual activity: Yes    Birth control/protection: Surgical

## 2022-05-27 ENCOUNTER — Ambulatory Visit (INDEPENDENT_AMBULATORY_CARE_PROVIDER_SITE_OTHER): Payer: Self-pay | Admitting: Sports Medicine

## 2022-05-27 ENCOUNTER — Encounter: Payer: Self-pay | Admitting: Sports Medicine

## 2022-05-27 ENCOUNTER — Ambulatory Visit: Payer: Self-pay

## 2022-05-27 DIAGNOSIS — M25511 Pain in right shoulder: Secondary | ICD-10-CM

## 2022-05-27 DIAGNOSIS — G8929 Other chronic pain: Secondary | ICD-10-CM

## 2022-05-27 MED ORDER — LIDOCAINE HCL 1 % IJ SOLN
0.5000 mL | INTRAMUSCULAR | Status: AC | PRN
  Administered 2022-05-27: .5 mL

## 2022-05-27 MED ORDER — METHYLPREDNISOLONE ACETATE 40 MG/ML IJ SUSP
40.0000 mg | INTRAMUSCULAR | Status: AC | PRN
  Administered 2022-05-27: 40 mg via INTRA_ARTICULAR

## 2022-05-27 NOTE — Progress Notes (Signed)
   Procedure Note  Patient: Alexandra Norris             Date of Birth: [birth year]           MRN: [id number]             Visit Date: 05/27/2022  Procedures: Visit Diagnoses:  1. Pain in right acromioclavicular joint   2. Chronic right shoulder pain    Medium Joint Inj: R acromioclavicular on 05/27/2022 8:38 AM Indications: pain Details: 25 G 1.5 in needle, ultrasound-guided anterior approach Medications: 0.5 mL lidocaine 1 %; 40 mg methylPREDNISolone acetate 40 MG/ML  US-guided AC Joint injection, right shoulder After discussion on risks/benefits/indications, informed verbal consent was obtained. A timeout was then performed. The patient was seated in examination room. The area overlying the [facility] joint of the shoulder was prepped with Betadine and alcohol swabs. The area was anesthesized first with 2cc of lidocaine 1%. Then utilizing ultrasound guidance, patient's AC joint was injected using a 25G, 1.5" needle with 0.5:1.54m lidocaine:depomedrol of injectate via an in-plane approach. Visualization of injectate flow was noted under ultrasound guidance. Patient tolerated the procedure well without immediate complications.   Procedure, treatment alternatives, risks and benefits explained, specific risks discussed. Consent was given by the patient. Immediately prior to procedure a time out was called to verify the correct patient, procedure, equipment, support staff and site/side marked as required. Patient was prepped and draped in the usual sterile fashion.    - I evaluated the patient about 10 minutes post-injection and she had significant improvement in pain and range of motion about the shoulder - follow-up with Dr. XErlinda Hongas indicated; I am happy to see them as needed  DElba Barman DO [place] This note was dictated using Dragon naturally speaking software and may contain errors in syntax, spelling, or content  which have not been identified prior to signing this note.

## 2022-06-02 ENCOUNTER — Encounter (HOSPITAL_BASED_OUTPATIENT_CLINIC_OR_DEPARTMENT_OTHER): Payer: Self-pay | Admitting: Emergency Medicine

## 2022-06-02 ENCOUNTER — Emergency Department (HOSPITAL_BASED_OUTPATIENT_CLINIC_OR_DEPARTMENT_OTHER)
Admission: EM | Admit: 2022-06-02 | Discharge: 2022-06-02 | Disposition: A | Discharge status: Home / Self Care | Payer: Self-pay | Attending: Emergency Medicine | Admitting: Emergency Medicine

## 2022-06-02 ENCOUNTER — Other Ambulatory Visit (HOSPITAL_BASED_OUTPATIENT_CLINIC_OR_DEPARTMENT_OTHER): Payer: Self-pay

## 2022-06-02 DIAGNOSIS — I10 Essential (primary) hypertension: Secondary | ICD-10-CM | POA: Insufficient documentation

## 2022-06-02 DIAGNOSIS — Z20822 Contact with and (suspected) exposure to covid-19: Secondary | ICD-10-CM | POA: Insufficient documentation

## 2022-06-02 DIAGNOSIS — J101 Influenza due to other identified influenza virus with other respiratory manifestations: Secondary | ICD-10-CM | POA: Insufficient documentation

## 2022-06-02 DIAGNOSIS — J111 Influenza due to unidentified influenza virus with other respiratory manifestations: Secondary | ICD-10-CM

## 2022-06-02 DIAGNOSIS — Z7984 Long term (current) use of oral hypoglycemic drugs: Secondary | ICD-10-CM | POA: Insufficient documentation

## 2022-06-02 DIAGNOSIS — Z9109 Other allergy status, other than to drugs and biological substances: Secondary | ICD-10-CM

## 2022-06-02 DIAGNOSIS — Z7951 Long term (current) use of inhaled steroids: Secondary | ICD-10-CM | POA: Insufficient documentation

## 2022-06-02 DIAGNOSIS — Z7985 Long-term (current) use of injectable non-insulin antidiabetic drugs: Secondary | ICD-10-CM | POA: Insufficient documentation

## 2022-06-02 DIAGNOSIS — E119 Type 2 diabetes mellitus without complications: Secondary | ICD-10-CM | POA: Insufficient documentation

## 2022-06-02 DIAGNOSIS — J45909 Unspecified asthma, uncomplicated: Secondary | ICD-10-CM | POA: Insufficient documentation

## 2022-06-02 DIAGNOSIS — Z79899 Other long term (current) drug therapy: Secondary | ICD-10-CM | POA: Insufficient documentation

## 2022-06-02 LAB — RESP PANEL BY RT-PCR (RSV, FLU A&B, COVID)  RVPGX2
Influenza A by PCR: POSITIVE — AB
Influenza B by PCR: NEGATIVE
Resp Syncytial Virus by PCR: NEGATIVE
SARS Coronavirus 2 by RT PCR: NEGATIVE

## 2022-06-02 MED ORDER — BENZONATATE 100 MG PO CAPS
100.0000 mg | ORAL_CAPSULE | Freq: Three times a day (TID) | ORAL | 0 refills | Status: DC
  Filled 2022-06-02: qty 21, 7d supply, fill #0

## 2022-06-02 MED ORDER — ALBUTEROL SULFATE HFA 108 (90 BASE) MCG/ACT IN AERS
2.0000 | INHALATION_SPRAY | Freq: Two times a day (BID) | RESPIRATORY_TRACT | 2 refills | Status: DC
  Filled 2022-06-02: qty 6.7, 50d supply, fill #0

## 2022-06-02 MED ORDER — ALBUTEROL SULFATE HFA 108 (90 BASE) MCG/ACT IN AERS
2.0000 | INHALATION_SPRAY | Freq: Once | RESPIRATORY_TRACT | Status: AC
  Administered 2022-06-02: 2 via RESPIRATORY_TRACT
  Filled 2022-06-02: qty 6.7

## 2022-06-02 MED ORDER — BENZONATATE 100 MG PO CAPS
100.0000 mg | ORAL_CAPSULE | Freq: Once | ORAL | Status: AC
  Administered 2022-06-02: 100 mg via ORAL
  Filled 2022-06-02: qty 1

## 2022-06-02 NOTE — Discharge Instructions (Signed)
You came to the department today due to flulike symptoms.  You tested positive for the flu.  This sort of viral illness is something that can be treated with over-the-counter medications like Tylenol and ibuprofen.  Other options include DayQuil/NyQuil, Mucinex for congestion, Robitussin/Delsym for cough and any other over-the-counter medications.  It is very important that you stay hydrated during this time as well.  You may use things like Powerade, Gatorade, electrolyte powders and water.  We hope that you feel better and do not hesitate to return to the emergency department with any worsening symptoms, especially chest pain, shortness of breath, dizziness and loss of consciousness.

## 2022-06-02 NOTE — ED Provider Notes (Signed)
[facility] EMERGENCY DEPT Provider Note   CSN: [id number] Arrival date & time: 06/02/22  1053     History  Chief Complaint  Patient presents with   Cough    Alexandra Norris is a 46 y.o. female with a past medical history of asthma, type 2 diabetes and hypertension presenting today with viral symptoms.  Have been going on since yesterday.  Cough occasionally productive of yellow phlegm, subjective fevers, myalgias, ETC.   Cough Associated symptoms: chills, fever and myalgias   Associated symptoms: no chest pain and no shortness of breath        Home Medications Prior to Admission medications   Medication Sig Start Date End Date Taking? Authorizing Provider  benzonatate (TESSALON) 100 MG capsule Take 1 capsule (100 mg total) by mouth every 8 (eight) hours. 06/02/22  Yes Meryl Hubers A, PA-C  albuterol (VENTOLIN HFA) 108 (90 Base) MCG/ACT inhaler Inhale 2 puffs into the lungs in the morning and at bedtime. 06/02/22   Chiniqua Kilcrease A, PA-C  atorvastatin (LIPITOR) 20 MG tablet Take 1 tablet (20 mg total) by mouth at bedtime. 09/15/21   Orma Render, NP  Dulaglutide (TRULICITY) 3 IR/4.4RX SOPN Inject 3 mg as directed once a week. 09/15/21   Orma Render, NP  fluticasone (FLONASE) 50 MCG/ACT nasal spray Place 2 sprays into both nostrils daily for allergies. 09/25/21   Orma Render, NP  gabapentin (NEURONTIN) 100 MG capsule Take 1 capsule (100 mg total) by mouth at bedtime. 03/26/22   Jessy Oto, MD  hydrOXYzine (ATARAX) 50 MG tablet Take 1 tablet (50 mg total) by mouth at bedtime and may repeat dose one time if needed for anxiety and sleep. 09/15/21   Orma Render, NP  lisinopril (ZESTRIL) 10 MG tablet Take 1 tablet (10 mg total) by mouth daily. 09/15/21   Orma Render, NP  meloxicam (MOBIC) 7.5 MG tablet Take 1 tablet (7.5 mg total) by mouth 2 (two) times daily as needed for pain. 05/26/22   Leandrew Koyanagi, MD  metFORMIN (GLUCOPHAGE) 500 MG  tablet Take 1 tablet (500 mg total) by mouth daily with breakfast AND 2 tablets (1,000 mg total) with dinner 09/15/21   Early, Coralee Pesa, NP  Multiple Vitamin (MULTIVITAMIN WITH MINERALS) TABS tablet Take 1 tablet by mouth daily in the afternoon.    [provider]  ondansetron (ZOFRAN) 4 MG tablet Take 1-2 tablets (4-8 mg total) by mouth every 8 (eight) hours as needed for nausea or vomiting. 12/17/21   Leandrew Koyanagi, MD  sertraline (ZOLOFT) 50 MG tablet Take 1 tablet (50 mg total) by mouth daily. 09/15/21   Orma Render, NP  Spacer/Aero-Holding Chambers (AEROCHAMBER PLUS FLO-VU LARGE) MISC See admin instructions. 07/14/21   [provider]  traMADol-acetaminophen (ULTRACET) 37.5-325 MG tablet Take 1 tablet by mouth every 6 (six) hours as needed. 02/13/22   Jessy Oto, MD      Allergies    Patient has no known allergies.    Review of Systems   Review of Systems  Constitutional:  Positive for chills and fever.  Respiratory:  Positive for cough. Negative for chest tightness and shortness of breath.   Cardiovascular:  Negative for chest pain and palpitations.  Musculoskeletal:  Positive for myalgias.    Physical Exam Updated Vital Signs BP (!) 122/50 (BP Location: Right Arm)   Pulse 100   Temp 99 F (37.2 C) (Oral)   Resp 18   Ht 5'  1" (1.549 m)   Wt 103.4 kg   LMP 09/20/2019 (Exact Date)   SpO2 100%   BMI 43.08 kg/m  Physical Exam Vitals and nursing note reviewed.  Constitutional:      General: She is not in acute distress.    Appearance: Normal appearance. She is not ill-appearing.  HENT:     Head: Normocephalic and atraumatic.     Mouth/Throat:     Mouth: Mucous membranes are moist.     Pharynx: Oropharynx is clear. No oropharyngeal exudate or posterior oropharyngeal erythema.  Eyes:     General: No scleral icterus.    Conjunctiva/sclera: Conjunctivae normal.  Cardiovascular:     Rate and Rhythm: Normal rate and regular rhythm.  Pulmonary:     Effort:  Pulmonary effort is normal. No respiratory distress.     Breath sounds: No wheezing.  Skin:    General: Skin is warm and dry.     Findings: No rash.  Neurological:     Mental Status: She is alert.  Psychiatric:        Mood and Affect: Mood normal.     ED Results / Procedures / Treatments   Labs (all labs ordered are listed, but only abnormal results are displayed) Labs Reviewed  RESP PANEL BY RT-PCR (RSV, FLU A&B, COVID)  RVPGX2 - Abnormal; Notable for the following components:      Result Value   Influenza A by PCR POSITIVE (*)    All other components within normal limits    EKG None  Radiology No results found.  Procedures Procedures   Medications Ordered in ED Medications  albuterol (VENTOLIN HFA) 108 (90 Base) MCG/ACT inhaler 2 puff (2 puffs Inhalation Given 06/02/22 1559)  benzonatate (TESSALON) capsule 100 mg (100 mg Oral Given 06/02/22 1603)    ED Course/ Medical Decision Making/ A&P                           Medical Decision Making Risk Prescription drug management.   46 year old female presenting today with URI symptoms.  Symptoms have been going on for 1 day.  On physical exam they are well-appearing.  Tested positive for the flu. We discussed that the flu is something that needs to run its course and they may use ibuprofen, Tylenol, DayQuil/NyQuil and other over-the-counter medications for their symptoms.  Return precautions discussed and they understand that they may follow-up on their symptoms outpatient with either PCP or urgent care as needed for nonemergent symptoms.  Agreeable to discharge at this time.   She requested a refill of her albuterol inhaler.  She was given 1 in the department, also had 1 sent to the pharmacy.  Also sent benzonatate.  Ambulatory and stable for discharge   Final Clinical Impression(s) / ED Diagnoses Final diagnoses:  Flu    Rx / DC Orders ED Discharge Orders          Ordered    albuterol (VENTOLIN HFA) 108 (90  Base) MCG/ACT inhaler  2 times daily        06/02/22 1604    benzonatate (TESSALON) 100 MG capsule  Every 8 hours        06/02/22 1604           Results and diagnoses were explained to the patient. Return precautions discussed in full. Patient had no additional questions and expressed complete understanding.   This chart was dictated using voice recognition software.  Despite best efforts to  proofread,  errors can occur which can change the documentation meaning.    Darliss Ridgel 06/02/22 1609    Gareth Morgan, MD 06/02/22 2352

## 2022-06-02 NOTE — ED Triage Notes (Signed)
Cough with burning in the chest since yesterday. Hx of bronchitis.

## 2022-06-03 ENCOUNTER — Ambulatory Visit: Payer: BC Managed Care – PPO | Admitting: Neurology

## 2022-06-05 ENCOUNTER — Encounter (HOSPITAL_BASED_OUTPATIENT_CLINIC_OR_DEPARTMENT_OTHER): Payer: Self-pay | Admitting: Family Medicine

## 2022-06-05 ENCOUNTER — Other Ambulatory Visit: Payer: Self-pay

## 2022-06-05 ENCOUNTER — Other Ambulatory Visit (HOSPITAL_COMMUNITY): Payer: Self-pay

## 2022-06-05 ENCOUNTER — Ambulatory Visit (INDEPENDENT_AMBULATORY_CARE_PROVIDER_SITE_OTHER): Payer: Self-pay | Admitting: Family Medicine

## 2022-06-05 DIAGNOSIS — J111 Influenza due to unidentified influenza virus with other respiratory manifestations: Secondary | ICD-10-CM

## 2022-06-05 MED ORDER — BENZONATATE 200 MG PO CAPS
200.0000 mg | ORAL_CAPSULE | Freq: Three times a day (TID) | ORAL | 0 refills | Status: DC | PRN
  Filled 2022-06-05: qty 45, 15d supply, fill #0

## 2022-06-05 NOTE — Assessment & Plan Note (Signed)
Patient began having symptoms Monday night. Presented to ED for evaluation of chest pain, cough, SOB. Was diagnosed with influenza, prescribed benzonatate, refill of albuterol inhaler. Was not prescribed Tamiflu at that time. She has continued to have fevers, body aches, cough, occasional shortness of breath. She has been using OTC medications to help with symptoms - DayQuil, honey, Tylenol. During visit, patient with occasional cough, able to speak in complete sentences. Discussed treatment considerations, recommend continuing with OTC medications, will send new prescription for slightly higher dose of benzonatate. Discussed precautions related to any worsening symptoms, particularly if having any worsening shortness of breath, trouble breathing.  If this does occur, recommend returning to the emergency department for further evaluation.  We did also discuss communicability of the flu virus and recommendations for limiting exposure to other individuals to reduce transmission

## 2022-06-05 NOTE — Progress Notes (Addendum)
   Virtual Visit via Telephone   I connected with  Alexandra Norris  on 06/05/22 by telephone/telehealth and verified that I am speaking with the correct person using two identifiers.   I discussed the limitations, risks, security and privacy concerns of performing an evaluation and management service by telephone, including the higher likelihood of inaccurate diagnosis and treatment, and the availability of in person appointments.  We also discussed the likely need of an additional face to face encounter for complete and high quality delivery of care.  I also discussed with the patient that there may be a patient responsible charge related to this service. The patient expressed understanding and wishes to proceed.  Provider location is in medical facility. Patient location is at their home, different from provider location. People involved in care of the patient during this telehealth encounter were myself, my nurse/medical assistant, and my front office/scheduling team member.  Review of Systems: No fevers, chills, night sweats, weight loss, chest pain, or shortness of breath.   Objective Findings:    General: Speaking full sentences, no audible heavy breathing.  Sounds alert and appropriately interactive.    Independent interpretation of tests performed by another provider:   None.  Brief History, Exam, Impression, and Recommendations:    Phone interpreter utilized // Interpreter [phone]  Influenza Patient began having symptoms Monday night. Presented to ED for evaluation of chest pain, cough, SOB. Was diagnosed with influenza, prescribed benzonatate, refill of albuterol inhaler. Was not prescribed Tamiflu at that time. She has continued to have fevers, body aches, cough, occasional shortness of breath. She has been using OTC medications to help with symptoms - DayQuil, honey, Tylenol. During visit, patient with occasional cough, able to speak in complete  sentences. Discussed treatment considerations, recommend continuing with OTC medications, will send new prescription for slightly higher dose of benzonatate. Discussed precautions related to any worsening symptoms, particularly if having any worsening shortness of breath, trouble breathing.  If this does occur, recommend returning to the emergency department for further evaluation.  We did also discuss communicability of the flu virus and recommendations for limiting exposure to other individuals to reduce transmission  I discussed the above assessment and treatment plan with the patient. The patient was provided an opportunity to ask questions and all were answered. The patient agreed with the plan and demonstrated an understanding of the instructions.  The patient was advised to call back or seek an in-person evaluation if the symptoms worsen or if the condition fails to improve as anticipated.  I provided 19 minutes of face to face and non-face-to-face time during this encounter date, time was needed to gather information, review chart, records, communicate/coordinate with staff remotely, as well as complete documentation.   ___________________________________________ Rally Ouch de [place], MD, ABFM, CAQSM [facility] and [place]

## 2022-06-08 ENCOUNTER — Encounter: Payer: Self-pay | Admitting: Nurse Practitioner

## 2022-06-17 ENCOUNTER — Emergency Department (HOSPITAL_COMMUNITY): Payer: Self-pay

## 2022-06-17 ENCOUNTER — Other Ambulatory Visit: Payer: Self-pay

## 2022-06-17 ENCOUNTER — Emergency Department (HOSPITAL_COMMUNITY)
Admission: EM | Admit: 2022-06-17 | Discharge: 2022-06-17 | Disposition: A | Discharge status: Home / Self Care | Payer: Self-pay | Attending: Emergency Medicine | Admitting: Emergency Medicine

## 2022-06-17 DIAGNOSIS — R0789 Other chest pain: Secondary | ICD-10-CM | POA: Insufficient documentation

## 2022-06-17 DIAGNOSIS — Z1152 Encounter for screening for COVID-19: Secondary | ICD-10-CM | POA: Insufficient documentation

## 2022-06-17 DIAGNOSIS — I10 Essential (primary) hypertension: Secondary | ICD-10-CM | POA: Insufficient documentation

## 2022-06-17 DIAGNOSIS — Z79899 Other long term (current) drug therapy: Secondary | ICD-10-CM | POA: Insufficient documentation

## 2022-06-17 DIAGNOSIS — D72829 Elevated white blood cell count, unspecified: Secondary | ICD-10-CM | POA: Insufficient documentation

## 2022-06-17 DIAGNOSIS — E119 Type 2 diabetes mellitus without complications: Secondary | ICD-10-CM | POA: Insufficient documentation

## 2022-06-17 DIAGNOSIS — Z7984 Long term (current) use of oral hypoglycemic drugs: Secondary | ICD-10-CM | POA: Insufficient documentation

## 2022-06-17 LAB — COMPREHENSIVE METABOLIC PANEL
ALT: 14 U/L (ref 0–44)
AST: 15 U/L (ref 15–41)
Albumin: 4.2 g/dL (ref 3.5–5.0)
Alkaline Phosphatase: 60 U/L (ref 38–126)
Anion gap: 7 (ref 5–15)
BUN: 7 mg/dL (ref 6–20)
CO2: 24 mmol/L (ref 22–32)
Calcium: 9 mg/dL (ref 8.9–10.3)
Chloride: 105 mmol/L (ref 98–111)
Creatinine, Ser: 0.8 mg/dL (ref 0.44–1.00)
GFR, Estimated: >60 mL/min (ref >60)
Glucose, Bld: 100 mg/dL — ABNORMAL HIGH (ref 70–99)
Potassium: 4.1 mmol/L (ref 3.5–5.1)
Sodium: 136 mmol/L (ref 135–145)
Total Bilirubin: 0.4 mg/dL (ref 0.3–1.2)
Total Protein: 7.2 g/dL (ref 6.5–8.1)

## 2022-06-17 LAB — CBC
HCT: 42.6 % (ref 36.0–46.0)
Hemoglobin: 13.9 g/dL (ref 12.0–15.0)
MCH: 27.3 pg (ref 26.0–34.0)
MCHC: 32.6 g/dL (ref 30.0–36.0)
MCV: 83.5 fL (ref 80.0–100.0)
Platelets: 298 10*3/uL (ref 150–400)
RBC: 5.1 MIL/uL (ref 3.87–5.11)
RDW: 13.3 % (ref 11.5–15.5)
WBC: 13.7 10*3/uL — ABNORMAL HIGH (ref 4.0–10.5)
nRBC: 0 % (ref 0.0–0.2)

## 2022-06-17 LAB — RESP PANEL BY RT-PCR (RSV, FLU A&B, COVID)  RVPGX2
Influenza A by PCR: NEGATIVE
Influenza B by PCR: NEGATIVE
Resp Syncytial Virus by PCR: NEGATIVE
SARS Coronavirus 2 by RT PCR: NEGATIVE

## 2022-06-17 LAB — TROPONIN I (HIGH SENSITIVITY)
Troponin I (High Sensitivity): <2 ng/L (ref <18)
Troponin I (High Sensitivity): <2 ng/L (ref <18)

## 2022-06-17 LAB — LIPASE, BLOOD: Lipase: 38 U/L (ref 11–51)

## 2022-06-17 MED ORDER — PREDNISONE 20 MG PO TABS
40.0000 mg | ORAL_TABLET | Freq: Every day | ORAL | 0 refills | Status: DC
  Filled 2022-06-17: qty 8, 4d supply, fill #0

## 2022-06-17 MED ORDER — AZITHROMYCIN 250 MG PO TABS
ORAL_TABLET | ORAL | 0 refills | Status: AC
  Filled 2022-06-17 – 2022-06-18 (×2): qty 6, 5d supply, fill #0

## 2022-06-17 MED ORDER — KETOROLAC TROMETHAMINE 30 MG/ML IJ SOLN
15.0000 mg | Freq: Once | INTRAMUSCULAR | Status: AC
  Administered 2022-06-17: 15 mg via INTRAVENOUS
  Filled 2022-06-17: qty 1

## 2022-06-17 MED ORDER — ONDANSETRON HCL 4 MG/2ML IJ SOLN
4.0000 mg | Freq: Once | INTRAMUSCULAR | Status: AC
  Administered 2022-06-17: 4 mg via INTRAVENOUS
  Filled 2022-06-17: qty 2

## 2022-06-17 MED ORDER — SODIUM CHLORIDE 0.9 % IV BOLUS
1000.0000 mL | Freq: Once | INTRAVENOUS | Status: AC
  Administered 2022-06-17: 1000 mL via INTRAVENOUS

## 2022-06-17 NOTE — ED Provider Notes (Signed)
[facility] EMERGENCY DEPARTMENT Provider Note   CSN: [id number] Arrival date & time: 06/17/22  1208     History  Chief Complaint  Patient presents with   Chest Pain    Alexandra Norris is a 47 y.o. female.  HPI Patient presents with 3 days of symptoms.  She was well prior to this.  She has a history of hypertension, diabetes, takes medication regularly.  She does not smoke, does not drink.  Over the past 3 days she has had chest pain described as burning in the left upper chest, headache, nausea, vomiting, fatigue.    Home Medications Prior to Admission medications   Medication Sig Start Date End Date Taking? Authorizing Provider  azithromycin (ZITHROMAX Z-PAK) 250 MG tablet Take 500 mg on day 1, 250 mg on days 2, 3, 4, 5. 06/17/22  Yes Carmin Muskrat, MD  predniSONE (DELTASONE) 20 MG tablet Take 2 tablets (40 mg total) by mouth daily with breakfast. For the next four days 06/17/22  Yes Carmin Muskrat, MD  albuterol (VENTOLIN HFA) 108 (90 Base) MCG/ACT inhaler Inhale 2 puffs into the lungs in the morning and at bedtime. 06/02/22   Redwine, Madison A, PA-C  atorvastatin (LIPITOR) 20 MG tablet Take 1 tablet (20 mg total) by mouth at bedtime. Patient not taking: Reported on 06/05/2022 09/15/21   Early, Coralee Pesa, NP  benzonatate (TESSALON) 200 MG capsule Take 1 capsule (200 mg total) by mouth 3 (three) times daily as needed for cough. 06/05/22   [place], Blondell Reveal, MD  Dulaglutide (TRULICITY) 3 JQ/3.0SP SOPN Inject 3 mg as directed once a week. 09/15/21   Orma Render, NP  fluticasone (FLONASE) 50 MCG/ACT nasal spray Place 2 sprays into both nostrils daily for allergies. Patient not taking: Reported on 06/05/2022 09/25/21   Early, Coralee Pesa, NP  gabapentin (NEURONTIN) 100 MG capsule Take 1 capsule (100 mg total) by mouth at bedtime. 03/26/22   Jessy Oto, MD  hydrOXYzine (ATARAX) 50 MG tablet Take 1 tablet (50 mg total) by mouth at bedtime and may repeat  dose one time if needed for anxiety and sleep. 09/15/21   Orma Render, NP  lisinopril (ZESTRIL) 10 MG tablet Take 1 tablet (10 mg total) by mouth daily. 09/15/21   Orma Render, NP  meloxicam (MOBIC) 7.5 MG tablet Take 1 tablet (7.5 mg total) by mouth 2 (two) times daily as needed for pain. 05/26/22   Leandrew Koyanagi, MD  metFORMIN (GLUCOPHAGE) 500 MG tablet Take 1 tablet (500 mg total) by mouth daily with breakfast AND 2 tablets (1,000 mg total) with dinner 09/15/21   Early, Coralee Pesa, NP  Multiple Vitamin (MULTIVITAMIN WITH MINERALS) TABS tablet Take 1 tablet by mouth daily in the afternoon.    [provider]  sertraline (ZOLOFT) 50 MG tablet Take 1 tablet (50 mg total) by mouth daily. 09/15/21   Orma Render, NP  Spacer/Aero-Holding Chambers (AEROCHAMBER PLUS FLO-VU LARGE) MISC See admin instructions. 07/14/21   [provider]  traMADol-acetaminophen (ULTRACET) 37.5-325 MG tablet Take 1 tablet by mouth every 6 (six) hours as needed. 02/13/22   Jessy Oto, MD      Allergies    Patient has no known allergies.    Review of Systems   Review of Systems  All other systems reviewed and are negative.   Physical Exam Updated Vital Signs BP 108/71   Pulse 75   Temp 98.8 F (37.1 C)   Resp  19   LMP 09/20/2019 (Exact Date)   SpO2 98%  Physical Exam Vitals and nursing note reviewed.  Constitutional:      General: She is not in acute distress.    Appearance: She is well-developed.  HENT:     Head: Normocephalic and atraumatic.  Eyes:     Conjunctiva/sclera: Conjunctivae normal.  Cardiovascular:     Rate and Rhythm: Normal rate and regular rhythm.  Pulmonary:     Effort: Pulmonary effort is normal. No respiratory distress.     Breath sounds: Normal breath sounds. No stridor.  Abdominal:     General: There is no distension.  Skin:    General: Skin is warm and dry.  Neurological:     Mental Status: She is alert and oriented to person, place, and time.     Cranial  Nerves: No cranial nerve deficit.  Psychiatric:        Mood and Affect: Mood normal.     ED Results / Procedures / Treatments   Labs (all labs ordered are listed, but only abnormal results are displayed) Labs Reviewed  CBC - Abnormal; Notable for the following components:      Result Value   WBC 13.7 (*)    All other components within normal limits  COMPREHENSIVE METABOLIC PANEL - Abnormal; Notable for the following components:   Glucose, Bld 100 (*)    All other components within normal limits  RESP PANEL BY RT-PCR (RSV, FLU A&B, COVID)  RVPGX2  LIPASE, BLOOD  I-STAT BETA HCG BLOOD, ED ([facility], [facility], [facility] ONLY)  TROPONIN I (HIGH SENSITIVITY)  TROPONIN I (HIGH SENSITIVITY)    EKG EKG Interpretation  Date/Time:  Wednesday June 17 2022 13:09:29 EST Ventricular Rate:  94 PR Interval:  152 QRS Duration: 72 QT Interval:  360 QTC Calculation: 450 R Axis:   102 Text Interpretation: Normal sinus rhythm Rightward axis Low voltage QRS Cannot rule out Anterior infarct , age undetermined Abnormal ECG When compared with ECG of 04-Feb-2021 14:17, PREVIOUS ECG IS PRESENT Confirmed by Carmin Muskrat [phone]) on 06/17/2022 5:08:08 PM  Radiology DG Chest 2 View  Result Date: 06/17/2022 CLINICAL DATA:  Chest pain radiating to the left side, shortness of breath, weakness, and lightheadedness EXAM: CHEST - 2 VIEW COMPARISON:  Chest radiograph dated 07/15/2021 FINDINGS: Normal lung volumes. No focal consolidations. No pleural effusion or pneumothorax. The heart size and mediastinal contours are within normal limits. Cervical spinal fixation hardware appears intact. Left humeral bone anchor. Right upper quadrant surgical clips. IMPRESSION: No active cardiopulmonary disease. Electronically Signed   By: Darrin Nipper M.D.   On: 06/17/2022 13:56    Procedures Procedures    Medications Ordered in ED Medications  ketorolac (TORADOL) 30 MG/ML injection 15 mg (15 mg Intravenous Given 06/17/22 1758)   ondansetron (ZOFRAN) injection 4 mg (4 mg Intravenous Given 06/17/22 1758)  sodium chloride 0.9 % bolus 1,000 mL (1,000 mLs Intravenous New Bag/Given 06/17/22 1804)    ED Course/ Medical Decision Making/ A&P                           Medical Decision Making Old female history of hypertension, diabetes, hypercholesterolemia presents with chest pain.  Elevated risk profile for ACS, though initial findings are somewhat reassuring, including exam, duration of symptoms 3 days, without decompensation.  However, differential including ACS, pneumonia, viral process, bronchitis, all considered.  Patient started on inflammatories, antiemetics, fluids while labs x-ray pending.  Pulse ox  100% room air normal Cardiac 85 sinus normal    Amount and/or Complexity of Data Reviewed External Data Reviewed: notes. Labs: ordered. Decision-making details documented in ED Course. Radiology: ordered. Decision-making details documented in ED Course.  Risk Prescription drug management.   8:02 PM Patient awake, alert, on monitors remains essentially the same, no tachycardia, tachypnea or hypoxia, no hypotension, no fever.  Given description of upper left anterior chest wall discomfort some suspicion for GI, though early pneumonia is a possibility given her cough, congestion, mild leukocytosis.  Patient comfortable with initiation of empiric therapy, can follow-up with primary care.  No early evidence for bacteremia, sepsis, low suspicion for ACS with 2 normal troponin, nonischemic EKG.  No evidence for aortic disruption either with no hypertension, no back pain, no neurocomplaints.        Final Clinical Impression(s) / ED Diagnoses Final diagnoses:  Atypical chest pain    Rx / DC Orders ED Discharge Orders          Ordered    azithromycin (ZITHROMAX Z-PAK) 250 MG tablet        06/17/22 2001    predniSONE (DELTASONE) 20 MG tablet  Daily with breakfast        06/17/22 Luella Cook, MD 06/17/22 2002

## 2022-06-17 NOTE — ED Provider Triage Note (Signed)
Emergency Medicine Provider Triage Evaluation Note  Alexandra Norris , a 47 y.o. female  was evaluated in triage.  Pt complains of left-sided chest pain x 2 days that radiates down left arm.  Admits to nausea and shortness of breath.  Review of Systems  Positive: CP Negative: fever  Physical Exam  BP (!) 116/100 (BP Location: Right Arm)   Pulse 89   Temp 98.4 F (36.9 C)   Resp 17   LMP 09/20/2019 (Exact Date)   SpO2 97%  Gen:   Awake, no distress   Resp:  Normal effort  MSK:   Moves extremities without difficulty  Other:    Medical Decision Making  Medically screening exam initiated at 1:12 PM.  Appropriate orders placed.  Alexandra Norris was informed that the remainder of the evaluation will be completed by another provider, this initial triage assessment does not replace that evaluation, and the importance of remaining in the ED until their evaluation is complete.  Labs EKG CXR   Suzy Bouchard, [place] 06/17/22 1313

## 2022-06-17 NOTE — ED Triage Notes (Signed)
Patient here for evaluation of left sided chest pain that started three days ago and got worse yesterday, now radiating into her left arm with tingling in her hand. Patient also complains of nausea and shortness of breath that started yesterday. Patient is alert, oriented, speaking in complete sentences and is in no apparent distress at this time.

## 2022-06-17 NOTE — Discharge Instructions (Signed)
As discussed, today's evaluation has been generally reassuring.  Your labs did not demonstrate evidence for changes in your heart, or other dangerous findings.  However, with consideration of possible early pneumonia, it is important that you monitor your condition carefully, take the medication as prescribed and follow-up with your physician.  Return here for concerning changes in your condition.

## 2022-06-18 ENCOUNTER — Other Ambulatory Visit (HOSPITAL_COMMUNITY): Payer: Self-pay

## 2022-06-29 ENCOUNTER — Encounter: Payer: Self-pay | Admitting: Physical Medicine & Rehabilitation

## 2022-07-24 ENCOUNTER — Encounter: Payer: Self-pay | Attending: Physical Medicine & Rehabilitation | Admitting: Physical Medicine & Rehabilitation

## 2022-07-24 NOTE — Progress Notes (Deleted)
Subjective:    Patient ID: Alexandra Norris, female    DOB: 08-13-1975, 47 y.o.   MRN: [id number]  HPI Pain Inventory Average Pain {NUMBERS; 0-10:5044} Pain Right Now {NUMBERS; 0-10:5044} My pain is {PAIN DESCRIPTION:21022940}  In the last 24 hours, has pain interfered with the following? General activity {NUMBERS; 0-10:5044} Relation with others {NUMBERS; 0-10:5044} Enjoyment of life {NUMBERS; 0-10:5044} What TIME of day is your pain at its worst? {time of day:24191} Sleep (in general) {BHH GOOD/FAIR/POOR:22877}  Pain is worse with: {ACTIVITIES:21022942} Pain improves with: {PAIN IMPROVES BW:4246458 Relief from Meds: {NUMBERS; 0-10:5044}  {MOBILITY WC:843389  {FUNCTION:21022946}  {NEURO/PSYCH:21022948}  {CPRM PRIOR STUDIES:21022953}  {CPRM PHYSICIANS INVOLVED IN YOUR CARE:21022954}    Family History  Problem Relation Age of Onset   Diabetes Mother    Osteoarthritis Mother    Hypertension Mother    Hyperlipidemia Mother    Arthritis Mother    Social History   Socioeconomic History   Marital status: Married    Spouse name: Not on file   Number of children: 3   Years of education: Not on file   Highest education level: 12th grade  Occupational History   Not on file  Tobacco Use   Smoking status: Never   Smokeless tobacco: Never  Vaping Use   Vaping Use: Never used  Substance and Sexual Activity   Alcohol use: No   Drug use: No   Sexual activity: Yes    Birth control/protection: Surgical  Other Topics Concern   Not on file  Social History Narrative   Caffeine none.     Education: 6 th grade.    Work:  remote    Scientist, physiological Strain: Not on file  Food Insecurity: Food Insecurity Present (11/21/2019)   Hunger Vital Sign    Worried About Running Out of Food in the Last Year: Sometimes true    Ran Out of Food in the Last Year: Sometimes true  Transportation Needs: No Transportation Needs  (11/21/2019)   PRAPARE - Hydrologist (Medical): No    Lack of Transportation (Non-Medical): No  Physical Activity: Not on file  Stress: Not on file  Social Connections: Not on file   Past Surgical History:  Procedure Laterality Date   ABDOMINAL HYSTERECTOMY  10/17/2019   APPENDECTOMY     CARPAL TUNNEL RELEASE Right 07/19/2019   Procedure: RIGHT CARPAL TUNNEL RELEASE;  Surgeon: Leandrew Koyanagi, MD;  Location: [place];  Service: Orthopedics;  Laterality: Right;   CARPAL TUNNEL RELEASE Left 09/13/2019   Procedure: LEFT CARPAL TUNNEL RELEASE;  Surgeon: Leandrew Koyanagi, MD;  Location: [place];  Service: Orthopedics;  Laterality: Left;  Bier block   [place]; 2008   CHOLECYSTECTOMY  2011   SHOULDER ARTHROSCOPY WITH DISTAL CLAVICLE RESECTION  02/12/2021   Procedure: SHOULDER ARTHROSCOPY WITH DISTAL CLAVICLE RESECTION;  Surgeon: Leandrew Koyanagi, MD;  Location: [place];  Service: Orthopedics;;   SHOULDER ARTHROSCOPY WITH ROTATOR CUFF REPAIR AND SUBACROMIAL DECOMPRESSION Left 02/12/2021   Procedure: LEFT SHOULDER ARTHROSCOPY WITH ROTATOR CUFF REPAIR, SUBACROMIAL DECOMPRESSION, EXTENSIVE DEBRIDEMENT;  Surgeon: Leandrew Koyanagi, MD;  Location: [place];  Service: Orthopedics;  Laterality: Left;   TONSILLECTOMY     tonsilletomy     TUBAL LIGATION  04/2007   ULNAR TUNNEL RELEASE Right 12/17/2021   Procedure: RIGHT CUBITAL TUNNEL RELEASE;  Surgeon: Leandrew Koyanagi, MD;  Location: [facility];  Service: Orthopedics;  Laterality: Right;   VAGINAL HYSTERECTOMY Bilateral 10/17/2019   Procedure: HYSTERECTOMY VAGINAL WITH SALPINGECTOMY;  Surgeon: Chancy Milroy, MD;  Location: [place];  Service: Gynecology;  Laterality: Bilateral;   Past Medical History:  Diagnosis Date   Anemia    Anemia    Anxiety    Arthritis    Asthma    Bilateral chronic knee pain 09/25/2015   Carpal tunnel syndrome     Depression    DEPRESSION, SITUATIONAL, PROLONGED 02/05/2009   Qualifier: Diagnosis of  By: Sherilyn Cooter  MD, Khary     Diabetes mellitus without complication ([place])    Dizziness 04/22/2021   Dyspnea    Elevated blood-pressure reading, without diagnosis of hypertension 06/17/2020   Endometriosis    Fatigue 04/22/2021   History of blood transfusion 1999   w/vaginal delivery   History of bronchitis    "I've stayed in hospital 2 X w/bronchitis"   Hyperlipidemia    Hypertension    Impingement syndrome of left shoulder    Influenza vaccine needed 04/03/2020   Left carpal tunnel syndrome 07/11/2019   Migraine    Moderate persistent asthma with acute exacerbation 12/06/2006   Qualifier: Diagnosis of  By: Sherilyn Cooter  MD, Khary     Neck pain 10/14/2020   Pneumonia    PONV (postoperative nausea and vomiting)    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   Pre-diabetes 10/03/2019   A1C 6.4   Right carpal tunnel syndrome 07/11/2019   S/P carpal tunnel release 08/30/2019   Seasonal allergies 10/08/2016   Shingles 11/14/2020   Sinusitis, bacterial 10/07/2021   LMP 09/20/2019 (Exact Date)   Opioid Risk Score:   Fall Risk Score:  `1  Depression screen [facility] 2/9     06/05/2022   10:41 AM 09/25/2021    4:05 PM 09/15/2021    8:56 AM 08/18/2021   12:50 PM 07/01/2021    8:21 AM 09/09/2020   11:44 AM 04/03/2020    2:13 PM  Depression screen PHQ 2/9  Decreased Interest 0 0 1 1 0 1 0  Down, Depressed, Hopeless 0 0 1 1 1 1 $ 0  PHQ - 2 Score 0 0 2 2 1 2 $ 0  Altered sleeping 0  0 3  2   Tired, decreased energy 0   3  2   Change in appetite 0  0 1  3   Feeling bad or failure about yourself  0  0 0  1   Trouble concentrating 0  1 0  1   Moving slowly or fidgety/restless 0  0 0  1   Suicidal thoughts 0  0 0  0   PHQ-9 Score 0  3 9  12   $ Difficult doing work/chores Not difficult at all  Not difficult at all Somewhat difficult  Somewhat difficult       Review of Systems     Objective:   Physical Exam         Assessment & Plan:

## 2022-07-28 ENCOUNTER — Emergency Department (HOSPITAL_BASED_OUTPATIENT_CLINIC_OR_DEPARTMENT_OTHER)
Admission: EM | Admit: 2022-07-28 | Discharge: 2022-07-28 | Disposition: A | Discharge status: Home / Self Care | Payer: Self-pay | Attending: Emergency Medicine | Admitting: Emergency Medicine

## 2022-07-28 ENCOUNTER — Encounter (HOSPITAL_BASED_OUTPATIENT_CLINIC_OR_DEPARTMENT_OTHER): Payer: Self-pay | Admitting: Family Medicine

## 2022-07-28 ENCOUNTER — Other Ambulatory Visit: Payer: Self-pay

## 2022-07-28 ENCOUNTER — Emergency Department (HOSPITAL_BASED_OUTPATIENT_CLINIC_OR_DEPARTMENT_OTHER): Payer: Self-pay

## 2022-07-28 ENCOUNTER — Ambulatory Visit (INDEPENDENT_AMBULATORY_CARE_PROVIDER_SITE_OTHER): Payer: Self-pay | Admitting: Family Medicine

## 2022-07-28 ENCOUNTER — Encounter (HOSPITAL_BASED_OUTPATIENT_CLINIC_OR_DEPARTMENT_OTHER): Payer: Self-pay

## 2022-07-28 VITALS — BP 130/80 | HR 86 | Ht 61.0 in | Wt 234.0 lb

## 2022-07-28 DIAGNOSIS — Z7984 Long term (current) use of oral hypoglycemic drugs: Secondary | ICD-10-CM | POA: Insufficient documentation

## 2022-07-28 DIAGNOSIS — J4540 Moderate persistent asthma, uncomplicated: Secondary | ICD-10-CM | POA: Insufficient documentation

## 2022-07-28 DIAGNOSIS — E1165 Type 2 diabetes mellitus with hyperglycemia: Secondary | ICD-10-CM | POA: Insufficient documentation

## 2022-07-28 DIAGNOSIS — B3731 Acute candidiasis of vulva and vagina: Secondary | ICD-10-CM | POA: Insufficient documentation

## 2022-07-28 DIAGNOSIS — R739 Hyperglycemia, unspecified: Secondary | ICD-10-CM

## 2022-07-28 DIAGNOSIS — I10 Essential (primary) hypertension: Secondary | ICD-10-CM | POA: Insufficient documentation

## 2022-07-28 DIAGNOSIS — Z79899 Other long term (current) drug therapy: Secondary | ICD-10-CM | POA: Insufficient documentation

## 2022-07-28 DIAGNOSIS — R519 Headache, unspecified: Secondary | ICD-10-CM | POA: Insufficient documentation

## 2022-07-28 LAB — URINALYSIS, ROUTINE W REFLEX MICROSCOPIC
Bacteria, UA: NONE SEEN
Bilirubin Urine: NEGATIVE
Glucose, UA: >1000 mg/dL — AB
Hgb urine dipstick: NEGATIVE
Leukocytes,Ua: NEGATIVE
Nitrite: NEGATIVE
Specific Gravity, Urine: 1.04 — ABNORMAL HIGH (ref 1.005–1.030)
pH: 6.5 (ref 5.0–8.0)

## 2022-07-28 LAB — BASIC METABOLIC PANEL
Anion gap: 9 (ref 5–15)
BUN: 10 mg/dL (ref 6–20)
CO2: 25 mmol/L (ref 22–32)
Calcium: 9.5 mg/dL (ref 8.9–10.3)
Chloride: 98 mmol/L (ref 98–111)
Creatinine, Ser: 0.45 mg/dL (ref 0.44–1.00)
GFR, Estimated: >60 mL/min (ref >60)
Glucose, Bld: 301 mg/dL — ABNORMAL HIGH (ref 70–99)
Potassium: 3.4 mmol/L — ABNORMAL LOW (ref 3.5–5.1)
Sodium: 132 mmol/L — ABNORMAL LOW (ref 135–145)

## 2022-07-28 LAB — CBC
HCT: 41.3 % (ref 36.0–46.0)
Hemoglobin: 13.4 g/dL (ref 12.0–15.0)
MCH: 26.6 pg (ref 26.0–34.0)
MCHC: 32.4 g/dL (ref 30.0–36.0)
MCV: 82.1 fL (ref 80.0–100.0)
Platelets: 225 10*3/uL (ref 150–400)
RBC: 5.03 MIL/uL (ref 3.87–5.11)
RDW: 13.4 % (ref 11.5–15.5)
WBC: 10.2 10*3/uL (ref 4.0–10.5)
nRBC: 0 % (ref 0.0–0.2)

## 2022-07-28 LAB — WET PREP, GENITAL
Clue Cells Wet Prep HPF POC: NONE SEEN
Sperm: NONE SEEN
Trich, Wet Prep: NONE SEEN
WBC, Wet Prep HPF POC: <10 (ref <10)
Yeast Wet Prep HPF POC: NONE SEEN

## 2022-07-28 LAB — CBG MONITORING, ED
Glucose-Capillary: 297 mg/dL — ABNORMAL HIGH (ref 70–99)
Glucose-Capillary: 230 mg/dL — ABNORMAL HIGH (ref 70–99)

## 2022-07-28 LAB — PREGNANCY, URINE: Preg Test, Ur: NEGATIVE

## 2022-07-28 MED ORDER — PROCHLORPERAZINE EDISYLATE 10 MG/2ML IJ SOLN
10.0000 mg | Freq: Once | INTRAMUSCULAR | Status: AC
  Administered 2022-07-28: 10 mg via INTRAVENOUS
  Filled 2022-07-28: qty 2

## 2022-07-28 MED ORDER — ACETAMINOPHEN 500 MG PO TABS
1000.0000 mg | ORAL_TABLET | Freq: Once | ORAL | Status: AC
  Administered 2022-07-28: 1000 mg via ORAL
  Filled 2022-07-28: qty 2

## 2022-07-28 MED ORDER — FLUCONAZOLE 150 MG PO TABS
150.0000 mg | ORAL_TABLET | Freq: Once | ORAL | 0 refills | Status: AC
  Filled 2022-07-28: qty 1, 1d supply, fill #0

## 2022-07-28 MED ORDER — MAGNESIUM SULFATE 2 GM/50ML IV SOLN
2.0000 g | Freq: Once | INTRAVENOUS | Status: DC
  Filled 2022-07-28: qty 50

## 2022-07-28 MED ORDER — KETOROLAC TROMETHAMINE 15 MG/ML IJ SOLN: 15.0000 mg | Freq: Once | INTRAMUSCULAR | Status: DC

## 2022-07-28 MED ORDER — LACTATED RINGERS IV BOLUS
1000.0000 mL | Freq: Once | INTRAVENOUS | Status: AC
  Administered 2022-07-28: 1000 mL via INTRAVENOUS

## 2022-07-28 MED ORDER — DIPHENHYDRAMINE HCL 25 MG PO CAPS
25.0000 mg | ORAL_CAPSULE | Freq: Once | ORAL | Status: AC
  Administered 2022-07-28: 25 mg via ORAL
  Filled 2022-07-28: qty 1

## 2022-07-28 MED ORDER — KETOROLAC TROMETHAMINE 15 MG/ML IJ SOLN
15.0000 mg | Freq: Once | INTRAMUSCULAR | Status: AC
  Administered 2022-07-28: 15 mg via INTRAVENOUS
  Filled 2022-07-28: qty 1

## 2022-07-28 MED ORDER — FLUCONAZOLE 150 MG PO TABS
150.0000 mg | ORAL_TABLET | Freq: Once | ORAL | Status: AC
  Administered 2022-07-28: 150 mg via ORAL
  Filled 2022-07-28: qty 1

## 2022-07-28 NOTE — Progress Notes (Signed)
Established Patient Office Visit  Subjective   Patient ID: Alexandra Norris, female    DOB: 29-May-1976  Age: 47 y.o. MRN: [id number]  Chief Complaint  Patient presents with   Blood Sugar Problem    Pt here for having high blood sugar numbers, she stated she checks it at home and it's been very high, pt need refills on her HTN and HDL medication     HPI  Hypertension Medication compliance: lisinopril 10 mg daily as prescribed.  Denies chest pain, shortness of breath, lower extremity edema. Endorses blurred vision and headaches.  Pertinent lab work: 06/17/22 CMP  Monitoring at home: does not monitor blood pressure at home Tolerating medication well: no side effects reported.  Continue current medication regimen: lisinopril 10 mg    Diabetes: Medication compliance: metformin 500 mg in morning and 1000 mg at dinner. Ran out of Trulicity since 2.5 months ago.  Denies chest pain, shortness of breath.  Endorses vision changes, polydipsia, polyphagia, and  polyuria. Denies hypoglycemia.  Pertinent lab work: A1C: 12/16/21 A1C 8.4 Monitoring: blood sugar readings at home: monitors at home: 540, 542 past 2 mornings, 426 this morning.        Continue current medication regimen:   Well controlled: not well controlled.   Follow-up: patient is not stable with diabetes and has been off of her medication. Recommend going to the ED now for further evaluation with blood sugars > 500 this week.       Review of Systems  Eyes:  Positive for blurred vision and double vision.  Respiratory:  Negative for shortness of breath.   Cardiovascular:  Negative for chest pain.  Neurological:  Positive for headaches.  Endo/Heme/Allergies:  Positive for polydipsia.      Objective:     BP 130/80 (BP Location: Left Arm, Patient Position: Sitting, Cuff Size: Large)   Pulse 86   Ht 5' 1"$  (1.549 m)   Wt 234 lb (106.1 kg)   LMP 09/20/2019 (Exact Date)   SpO2 97%   BMI 44.21 kg/m  BP  Readings from Last 3 Encounters:  07/28/22 128/78  07/28/22 130/80  06/17/22 109/75      Physical Exam Vitals and nursing note reviewed.  Constitutional:      Appearance: She is ill-appearing.  Pulmonary:     Effort: Pulmonary effort is normal.  Skin:    General: Skin is warm and dry.  Neurological:     General: No focal deficit present.     Mental Status: She is alert. Mental status is at baseline.  Psychiatric:        Behavior: Behavior normal.     Results for orders placed or performed during the hospital encounter of 07/28/22  CBC  Result Value Ref Range   WBC 10.2 4.0 - 10.5 K/uL   RBC 5.03 3.87 - 5.11 MIL/uL   Hemoglobin 13.4 12.0 - 15.0 g/dL   HCT 41.3 36.0 - 46.0 %   MCV 82.1 80.0 - 100.0 fL   MCH 26.6 26.0 - 34.0 pg   MCHC 32.4 30.0 - 36.0 g/dL   RDW 13.4 11.5 - 15.5 %   Platelets 225 150 - 400 K/uL   nRBC 0.0 0.0 - 0.2 %  CBG monitoring, ED  Result Value Ref Range   Glucose-Capillary 297 (H) 70 - 99 mg/dL      The 10-year ASCVD risk score (Arnett DK, et al., 2019) is: 2.3%    Assessment & Plan:   Problem List Items Addressed  This Visit     Uncontrolled type 2 diabetes mellitus with hyperglycemia ([place]) - Primary    Presents for follow-up for diabetes.  Reports that she has been out of Trulicity for 123456 months.  She has been taking metformin 500 mg in the morning and 1000 mg at dinner.  She denies chest pain or shortness of breath.  Endorses vision changes, polydipsia, polyphagia and polyuria.  Reports that her fasting blood sugars have been 540, 542 and 426 this morning.  This patient has uncontrolled diabetes with blood sugars greater than 500. CMA escorted patient to emergency department for further evaluation.  Not appropriate for outpatient treatment.      CMA escorted patient to ED for further evaluation. Patient reported to [place] that her husband wanted her to go to ED due to how she is feeling.    Return for go to ED now for further evaluation  and treatment. Chalmers Guest, FNP

## 2022-07-28 NOTE — ED Provider Notes (Signed)
[facility] Provider Note   CSN: [id number] Arrival date & time: 07/28/22  1644     History {Add pertinent medical, surgical, social history, OB history to HPI:1} Chief Complaint  Patient presents with   Hyperglycemia    Alexandra Norris is a 47 y.o. female with *** presents with ***.   Patient here POV from PCP. Endorses Hyperglycemia since this Weekend. Has been above 400 consistently. Sent by PCP today for management and Evaluation. No Changes in Diabetes Management recently or no recent steroid use.  Some Nausea, Polyuria, Polydipsia as well.   Nausea, diffuse abdominal pain, and headache 8/10 all over with some blurry vision.   Also endorses "sores" on her vagina with some small volume bleeding. ***    Hyperglycemia      Home Medications Prior to Admission medications   Medication Sig Start Date End Date Taking? Authorizing Provider  albuterol (VENTOLIN HFA) 108 (90 Base) MCG/ACT inhaler Inhale 2 puffs into the lungs in the morning and at bedtime. 06/02/22   Redwine, Madison A, PA-C  atorvastatin (LIPITOR) 20 MG tablet Take 1 tablet (20 mg total) by mouth at bedtime. Patient not taking: Reported on 06/05/2022 09/15/21   Early, Coralee Pesa, NP  lisinopril (ZESTRIL) 10 MG tablet Take 1 tablet (10 mg total) by mouth daily. 09/15/21   Orma Render, NP  metFORMIN (GLUCOPHAGE) 500 MG tablet Take 1 tablet (500 mg total) by mouth daily with breakfast AND 2 tablets (1,000 mg total) with dinner 09/15/21   Early, Coralee Pesa, NP  Multiple Vitamin (MULTIVITAMIN WITH MINERALS) TABS tablet Take 1 tablet by mouth daily in the afternoon.    [provider]      Allergies    Patient has no known allergies.    Review of Systems   Review of Systems Review of systems {pos/neg:18640::"Negative","Positive"} for ***.  A 10 point review of systems was performed and is negative unless otherwise reported in HPI.  Physical  Exam Updated Vital Signs BP 136/68 (BP Location: Right Arm)   Pulse 79   Temp 98 F (36.7 C) (Oral)   Resp 18   Ht 5' 1"$  (1.549 m)   Wt 106.1 kg   LMP 09/20/2019 (Exact Date)   SpO2 98%   BMI 44.20 kg/m  Physical Exam General: Normal appearing {Desc; female/female:11659}, lying in bed.  HEENT: PERRLA, Sclera anicteric, MMM, trachea midline.  Cardiology: RRR, no murmurs/rubs/gallops. BL radial and DP pulses equal bilaterally.  Resp: Normal respiratory rate and effort. CTAB, no wheezes, rhonchi, crackles.  Abd: Soft, non-tender, non-distended. No rebound tenderness or guarding.  GU: Deferred. MSK: No peripheral edema or signs of trauma. Extremities without deformity or TTP. No cyanosis or clubbing. Skin: warm, dry. No rashes or lesions. Back: No CVA tenderness Neuro: A&Ox4, CNs II-XII grossly intact. MAEs. Sensation grossly intact.  Psych: Normal mood and affect.   ED Results / Procedures / Treatments   Labs (all labs ordered are listed, but only abnormal results are displayed) Labs Reviewed  BASIC METABOLIC PANEL - Abnormal; Notable for the following components:      Result Value   Sodium 132 (*)    Potassium 3.4 (*)    Glucose, Bld 301 (*)    All other components within normal limits  URINALYSIS, ROUTINE W REFLEX MICROSCOPIC - Abnormal; Notable for the following components:   Specific Gravity, Urine 1.040 (*)    Glucose, UA >1,000 (*)    Ketones, ur TRACE (*)  Protein, ur TRACE (*)    All other components within normal limits  CBG MONITORING, ED - Abnormal; Notable for the following components:   Glucose-Capillary 297 (*)    All other components within normal limits  CBC  PREGNANCY, URINE  CBG MONITORING, ED    EKG None  Radiology No results found.  Procedures Procedures  {Document cardiac monitor, telemetry assessment procedure when appropriate:1}  Medications Ordered in ED Medications - No data to display  ED Course/ Medical Decision Making/ A&P                           Medical Decision Making Amount and/or Complexity of Data Reviewed Labs: ordered. Decision-making details documented in ED Course.  Risk OTC drugs. Prescription drug management.    This patient presents to the ED for concern of ***, this involves an extensive number of treatment options, and is a complaint that carries with it a high risk of complications and morbidity.  I considered the following differential and admission for this acute, potentially life threatening condition.   MDM:    ***  Clinical Course as of 07/28/22 2149  Tue Jul 28, 2022  1959 Glucose-Capillary(!): 230 [HN]  1959 Ketones, ur(!): TRACE Trace ketonuria, but no anion gap or metabolic acidosis to indicate DKA [HN]  1959 CO2: 25 [HN]  1959 Anion gap: 9 [HN]  1959 Potassium(!): 3.4 [HN]  1959 Sodium(!): 132 [HN]  1959 Preg Test, Ur: NEGATIVE [HN]  1959 WBC: 10.2 [HN]  2000 Hemoglobin: 13.4 [HN]    Clinical Course User Index [HN] Audley Hose, MD    Labs: I Ordered, and personally interpreted labs.  The pertinent results include:  ***  Imaging Studies ordered: I ordered imaging studies including *** I independently visualized and interpreted imaging. I agree with the radiologist interpretation  Additional history obtained from ***.  External records from outside source obtained and reviewed including ***  Cardiac Monitoring: The patient was maintained on a cardiac monitor.  I personally viewed and interpreted the cardiac monitored which showed an underlying rhythm of: ***  Reevaluation: After the interventions noted above, I reevaluated the patient and found that they have :{resolved/improved/worsened:23923::"improved"}  Social Determinants of Health: ***  Disposition:  ***  Co morbidities that complicate the patient evaluation  Past Medical History:  Diagnosis Date   Anemia    Anemia    Anxiety    Arthritis    Asthma    Bilateral chronic knee pain 09/25/2015    Carpal tunnel syndrome    Depression    DEPRESSION, SITUATIONAL, PROLONGED 02/05/2009   Qualifier: Diagnosis of  By: Sherilyn Cooter  MD, Khary     Diabetes mellitus without complication ([place])    Dizziness 04/22/2021   Dyspnea    Elevated blood-pressure reading, without diagnosis of hypertension 06/17/2020   Endometriosis    Fatigue 04/22/2021   History of blood transfusion 1999   w/vaginal delivery   History of bronchitis    "I've stayed in hospital 2 X w/bronchitis"   Hyperlipidemia    Hypertension    Impingement syndrome of left shoulder    Influenza vaccine needed 04/03/2020   Left carpal tunnel syndrome 07/11/2019   Migraine    Moderate persistent asthma with acute exacerbation 12/06/2006   Qualifier: Diagnosis of  By: Sherilyn Cooter  MD, Khary     Neck pain 10/14/2020   Pneumonia    PONV (postoperative nausea and vomiting)    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   I have reviewed the patients home medicines and have made adjustments as needed  Problem List / ED Course: Problem List Items Addressed This Visit   None        {Document critical care time when appropriate:1} {Document review of labs and clinical decision tools ie heart score, Chads2Vasc2 etc:1}  {Document your independent review of radiology images, and any outside records:1} {Document your discussion with family members, caretakers, and with consultants:1} {Document social determinants of health affecting pt's care:1} {Document your decision making why or why not admission, treatments were needed:1}  This note was created using dictation software, which may contain spelling or grammatical errors.

## 2022-07-28 NOTE — ED Notes (Signed)
CBG results given to Lavone Orn, RN

## 2022-07-28 NOTE — Discharge Instructions (Addendum)
Gracias por venir al [employer] Psychologist, sport and exercise. Le atendieron por dolor de cabeza, niveles altos de azcar en sangre e irritacin vaginal. Hicimos un examen, laboratorios e imgenes, y [employer] de azcar en la sangre que mejoraron con lquidos, su dolor de cabeza mejor con medicamentos para Conservation officer, historic buildings, tratamos su candidiasis vaginal con fluconazol. Tambin le hemos recetado una pastilla de fluconazol que puede tomar el 23/07/2022 si sus sntomas no han mejorado. Haga un seguimiento con su proveedor de atencin primaria dentro de 1 semana sobre sus medicamentos para Glass blower/designer en la sangre, su dolor de [place] y su infeccin por Candida.   No dude en regresar al servicio de urgencias o llamar al 911 si experimenta: -Empeoramiento de los sntomas -Visin borrosa, visin doble, entumecimiento/hormigueo, debilidad asimtrica. -Cefalea intensa de inicio agudo, letargo, confusin. -Dolor abdominal, nuseas/vmitos. -Aturdimiento, desmayo. -Fiebre/escalofros -Cualquier otra cosa que te preocupe

## 2022-07-28 NOTE — ED Triage Notes (Signed)
Patient here POV from PCP.  Endorses Hyperglycemia since this Weekend. Has been above 400 consistently. Sent by PCP today for management and Evaluation. No Changes in Diabetes Management recently or no recent steroid use.   Some Nausea, Polyuria, Polydipsia as well.   NAD Noted during Triage. A&Ox4. Gcs 15. Ambulatory

## 2022-07-28 NOTE — Assessment & Plan Note (Addendum)
Presents for follow-up for diabetes.  Reports that she has been out of Trul[facility] for [id number] months.  She has been taking metformin 500 mg in the morning and 1000 mg at dinner.  She denies chest pain or shortness of breath.  Endorses vision changes, polydipsia, polyphagia and polyuria.  Reports that her fasting blood sugars have been 540, 542 and 426 this morning.  This patient has uncontrolled diabetes with blood sugars greater than 500. CMA escorted patient to emergency department for further evaluation.  Not appropriate for outpatient treatment.

## 2022-07-29 ENCOUNTER — Other Ambulatory Visit (HOSPITAL_BASED_OUTPATIENT_CLINIC_OR_DEPARTMENT_OTHER): Payer: Self-pay

## 2022-07-30 ENCOUNTER — Ambulatory Visit (HOSPITAL_BASED_OUTPATIENT_CLINIC_OR_DEPARTMENT_OTHER): Payer: Self-pay

## 2022-07-30 LAB — GC/CHLAMYDIA PROBE AMP (~~LOC~~) NOT AT ARMC
Chlamydia: NEGATIVE
Comment: NEGATIVE
Comment: NORMAL
Neisseria Gonorrhea: NEGATIVE

## 2022-08-03 ENCOUNTER — Other Ambulatory Visit (HOSPITAL_BASED_OUTPATIENT_CLINIC_OR_DEPARTMENT_OTHER): Payer: Self-pay

## 2022-08-03 ENCOUNTER — Encounter (HOSPITAL_BASED_OUTPATIENT_CLINIC_OR_DEPARTMENT_OTHER): Payer: Self-pay

## 2022-08-05 ENCOUNTER — Other Ambulatory Visit (HOSPITAL_BASED_OUTPATIENT_CLINIC_OR_DEPARTMENT_OTHER): Payer: Self-pay

## 2022-08-19 ENCOUNTER — Encounter (HOSPITAL_BASED_OUTPATIENT_CLINIC_OR_DEPARTMENT_OTHER): Payer: Self-pay

## 2022-09-17 ENCOUNTER — Ambulatory Visit (INDEPENDENT_AMBULATORY_CARE_PROVIDER_SITE_OTHER): Payer: Self-pay | Admitting: Orthopaedic Surgery

## 2022-09-17 ENCOUNTER — Other Ambulatory Visit (INDEPENDENT_AMBULATORY_CARE_PROVIDER_SITE_OTHER): Payer: Self-pay

## 2022-09-17 DIAGNOSIS — M25561 Pain in right knee: Secondary | ICD-10-CM

## 2022-09-17 NOTE — Progress Notes (Signed)
Office Visit Note   Patient: Alexandra Norris           Date of Birth: 03/04/1976           MRN: [id number] Visit Date: 09/17/2022              Requested by: de Peru, Raymond J, MD [address] [place],  [place] [address] PCP: de Peru, Raymond J, MD   Assessment & Plan: Visit Diagnoses:  1. Acute pain of right knee     Plan: Impression is right knee avulsion fracture to the proximal patella may indicate partial quad tear.  Her function is preserved..  At this point, we have discussed that this should heal appropriately with conservative treatment.  Will place her in a playmaker brace locked from 0 to 60 degrees of flexion for which she will wear at all times.  She will follow-up with [place] in 4 weeks for recheck.  Call with concerns or questions.  Follow-Up Instructions: Return in about 4 weeks (around 10/15/2022).   Orders:  Orders Placed This Encounter  Procedures   XR KNEE 3 VIEW RIGHT   No orders of the defined types were placed in this encounter.     Procedures: No procedures performed   Clinical Data: No additional findings.   Subjective: No chief complaint on file.   HPI patient is a pleasant 47 year old Spanish-speaking female who is here today with interpreter.  She is here with right knee pain for the past 2 months.  She notes that she fell at work around the time of onset of pain but did not notice symptoms until a few days later.  The pain is to the quad tendon.  Symptoms appear worse with stair climbing, driving wheels any sort of knee flexion.  She denies any mechanical symptoms but does feel like her knee wants to hyperextend when she is ambulating.  She is not taking medication for this.  Review of Systems as detailed in HPI.  All others reviewed and are negative.   Objective: Vital Signs: LMP 09/20/2019 (Exact Date)   Physical Exam well-developed well-nourished female in no acute distress.  Alert and oriented x 3.  Ortho Exam right  knee exam shows moderate tenderness along the distal quad tendon.  She has 3 out of 5 strength with resisted straight leg raise.  No joint line tenderness.  Ligaments are stable.  She is neurovascularly intact distally.  Specialty Comments:  No specialty comments available.  Imaging: XR KNEE 3 VIEW RIGHT  Result Date: 09/17/2022 Small avulsion to the proximal patella    PMFS History: Patient Active Problem List   Diagnosis Date Noted   Uncontrolled type 2 diabetes mellitus with hyperglycemia 07/28/2022   Influenza 06/05/2022   Cervical post-laminectomy syndrome 03/06/2022   Cervical radiculopathy 03/05/2022   Snoring 01/20/2022   Impingement syndrome of right shoulder 12/30/2021   Cubital tunnel syndrome on right 12/02/2021   Asthma due to seasonal allergies 09/25/2021   Cough 07/15/2021   S/P arthroscopy of right shoulder 07/08/2021   Type 1 superior labral anterior-to-posterior (SLAP) tear of right shoulder 04/24/2021   Arthritis of right acromioclavicular joint 04/24/2021   Chronic left shoulder pain 04/22/2021   Hypertension associated with diabetes 04/22/2021   Hyperlipidemia associated with type 2 diabetes mellitus 04/22/2021   Nontraumatic tear of left supraspinatus tendon 02/12/2021   Encounter to establish care 09/09/2020   Fatty liver disease, nonalcoholic 09/09/2020   Cervical disc disorder at C5-C6 level with radiculopathy 04/03/2020  Arthritis    Nasal obstruction 02/18/2016   DM2 (diabetes mellitus, type 2) 10/21/2015   Vitamin D deficiency 09/25/2015   Anxiety and depression 07/06/2014   GASTROESOPHAGEAL REFLUX DISEASE 08/14/2009   Body mass index (BMI) 45.0-49.9, adult 05/30/2007   Past Medical History:  Diagnosis Date   Anemia    Anemia    Anxiety    Arthritis    Asthma    Bilateral chronic knee pain 09/25/2015   Carpal tunnel syndrome    Depression    DEPRESSION, SITUATIONAL, PROLONGED 02/05/2009   Qualifier: Diagnosis of  By: Wallene Huharew  MD, Khary      Diabetes mellitus without complication (HCC)    Dizziness 04/22/2021   Dyspnea    Elevated blood-pressure reading, without diagnosis of hypertension 06/17/2020   Endometriosis    Fatigue 04/22/2021   History of blood transfusion 1999   w/vaginal delivery   History of bronchitis    "I've stayed in hospital 2 X w/bronchitis"   Hyperlipidemia    Hypertension    Impingement syndrome of left shoulder    Influenza vaccine needed 04/03/2020   Left carpal tunnel syndrome 07/11/2019   Migraine    Moderate persistent asthma with acute exacerbation 12/06/2006   Qualifier: Diagnosis of  By: Wallene Huharew  MD, Khary     Neck pain 10/14/2020   Pneumonia    PONV (postoperative nausea and vomiting)    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   Pre-diabetes 10/03/2019   A1C 6.4   Right carpal tunnel syndrome 07/11/2019   S/P carpal tunnel release 08/30/2019   Seasonal allergies 10/08/2016   Shingles 11/14/2020   Sinusitis, bacterial 10/07/2021    Family History  Problem Relation Age of Onset   Diabetes Mother    Osteoarthritis Mother    Hypertension Mother    Hyperlipidemia Mother    Arthritis Mother     Past Surgical History:  Procedure Laterality Date   ABDOMINAL HYSTERECTOMY  10/17/2019   APPENDECTOMY     CARPAL TUNNEL RELEASE Right 07/19/2019   Procedure: RIGHT CARPAL TUNNEL RELEASE;  Surgeon: Tarry KosXu, Nigel Wessman M, MD;  Location: [facility];  Service: Orthopedics;  Laterality: Right;   CARPAL TUNNEL RELEASE Left 09/13/2019   Procedure: LEFT CARPAL TUNNEL RELEASE;  Surgeon: Tarry KosXu, Maryfrances Portugal M, MD;  Location: [facility];  Service: Orthopedics;  Laterality: Left;  Bier block   CESAREAN SECTION  1997; 2008   CHOLECYSTECTOMY  2011   SHOULDER ARTHROSCOPY WITH DISTAL CLAVICLE RESECTION  02/12/2021   Procedure: SHOULDER ARTHROSCOPY WITH DISTAL CLAVICLE RESECTION;  Surgeon: Tarry KosXu, Pranika Finks M, MD;  Location: [facility];  Service: Orthopedics;;   SHOULDER ARTHROSCOPY WITH  ROTATOR CUFF REPAIR AND SUBACROMIAL DECOMPRESSION Left 02/12/2021   Procedure: LEFT SHOULDER ARTHROSCOPY WITH ROTATOR CUFF REPAIR, SUBACROMIAL DECOMPRESSION, EXTENSIVE DEBRIDEMENT;  Surgeon: Tarry KosXu, Ercole Georg M, MD;  Location: [facility];  Service: Orthopedics;  Laterality: Left;   TONSILLECTOMY     tonsilletomy     TUBAL LIGATION  04/2007   ULNAR TUNNEL RELEASE Right 12/17/2021   Procedure: RIGHT CUBITAL TUNNEL RELEASE;  Surgeon: Tarry KosXu, Reichen Hutzler M, MD;  Location: [facility];  Service: Orthopedics;  Laterality: Right;   VAGINAL HYSTERECTOMY Bilateral 10/17/2019   Procedure: HYSTERECTOMY VAGINAL WITH SALPINGECTOMY;  Surgeon: Hermina StaggersErvin, Michael L, MD;  [facility];  Service: Gynecology;  Laterality: Bilateral;   Social History   Occupational History   Not on file  Tobacco Use   Smoking status: Never   Smokeless tobacco: Never  Vaping Use   Vaping Use: Never used  Substance and Sexual Activity   Alcohol use: No   Drug use: No   Sexual activity: Yes    Birth control/protection: Surgical

## 2022-09-18 ENCOUNTER — Ambulatory Visit: Payer: Self-pay | Admitting: Orthopaedic Surgery

## 2022-10-05 ENCOUNTER — Telehealth (HOSPITAL_BASED_OUTPATIENT_CLINIC_OR_DEPARTMENT_OTHER): Payer: Self-pay | Admitting: Family Medicine

## 2022-10-05 ENCOUNTER — Other Ambulatory Visit (HOSPITAL_BASED_OUTPATIENT_CLINIC_OR_DEPARTMENT_OTHER): Payer: Self-pay

## 2022-10-05 DIAGNOSIS — Z9109 Other allergy status, other than to drugs and biological substances: Secondary | ICD-10-CM

## 2022-10-05 DIAGNOSIS — J45909 Unspecified asthma, uncomplicated: Secondary | ICD-10-CM

## 2022-10-05 MED ORDER — ALBUTEROL SULFATE HFA 108 (90 BASE) MCG/ACT IN AERS
2.0000 | INHALATION_SPRAY | Freq: Four times a day (QID) | RESPIRATORY_TRACT | 0 refills | Status: DC | PRN
  Filled 2022-10-05: qty 6.7, 25d supply, fill #0

## 2022-10-05 NOTE — Telephone Encounter (Signed)
Refill sent in

## 2022-10-05 NOTE — Telephone Encounter (Signed)
Patient spouse said she is having difficulty with breathing informed him ER stated she needs albuterol refill.

## 2022-10-15 ENCOUNTER — Ambulatory Visit: Payer: Self-pay | Admitting: Physician Assistant

## 2022-10-15 NOTE — Progress Notes (Deleted)
Office Visit Note   Patient: Alexandra Norris           Date of Birth: [birth year]           MRN: [id number] Visit Date: 10/15/2022              Requested by: de Peru, Raymond J, MD [address] [place],  [place] [address] PCP: de Peru, Raymond J, MD   Assessment & Plan: Visit Diagnoses: No diagnosis found.  Plan: ***  Follow-Up Instructions: No follow-ups on file.   Orders:  No orders of the defined types were placed in this encounter.  No orders of the defined types were placed in this encounter.     Procedures: No procedures performed   Clinical Data: No additional findings.   Subjective: No chief complaint on file.   HPI  Review of Systems   Objective: Vital Signs: LMP 09/20/2019 (Exact Date)   Physical Exam  Ortho Exam  Specialty Comments:  No specialty comments available.  Imaging: No results found.   PMFS History: Patient Active Problem List   Diagnosis Date Noted   Uncontrolled type 2 diabetes mellitus with hyperglycemia (HCC) 07/28/2022   Influenza 06/05/2022   Cervical post-laminectomy syndrome 03/06/2022   Cervical radiculopathy 03/05/2022   Snoring 01/20/2022   Impingement syndrome of right shoulder 12/30/2021   Cubital tunnel syndrome on right 12/02/2021   Asthma due to seasonal allergies 09/25/2021   Cough 07/15/2021   S/P arthroscopy of right shoulder 07/08/2021   Type 1 superior labral anterior-to-posterior (SLAP) tear of right shoulder 04/24/2021   Arthritis of right acromioclavicular joint 04/24/2021   Chronic left shoulder pain 04/22/2021   Hypertension associated with diabetes (HCC) 04/22/2021   Hyperlipidemia associated with type 2 diabetes mellitus (HCC) 04/22/2021   Nontraumatic tear of left supraspinatus tendon 02/12/2021   Encounter to establish care 09/09/2020   Fatty liver disease, nonalcoholic 09/09/2020   Cervical disc disorder at C5-C6 level with radiculopathy 04/03/2020   Arthritis    Nasal  obstruction 02/18/2016   DM2 (diabetes mellitus, type 2) (HCC) 10/21/2015   Vitamin D deficiency 09/25/2015   Anxiety and depression 07/06/2014   GASTROESOPHAGEAL REFLUX DISEASE 08/14/2009   Body mass index (BMI) 45.0-49.9, adult (HCC) 05/30/2007   Past Medical History:  Diagnosis Date   Anemia    Anemia    Anxiety    Arthritis    Asthma    Bilateral chronic knee pain 09/25/2015   Carpal tunnel syndrome    Depression    DEPRESSION, SITUATIONAL, PROLONGED 02/05/2009   Qualifier: Diagnosis of  By: Wallene Huh  MD, Khary     Diabetes mellitus without complication (HCC)    Dizziness 04/22/2021   Dyspnea    Elevated blood-pressure reading, without diagnosis of hypertension 06/17/2020   Endometriosis    Fatigue 04/22/2021   History of blood transfusion 1999   w/vaginal delivery   History of bronchitis    "I've stayed in hospital 2 X w/bronchitis"   Hyperlipidemia    Hypertension    Impingement syndrome of left shoulder    Influenza vaccine needed 04/03/2020   Left carpal tunnel syndrome 07/11/2019   Migraine    Moderate persistent asthma with acute exacerbation 12/06/2006   Qualifier: Diagnosis of  By: Wallene Huh  MD, Khary     Neck pain 10/14/2020   Pneumonia    PONV (postoperative nausea and vomiting)    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   Pre-diabetes 10/03/2019   A1C 6.4   Right carpal tunnel syndrome  07/11/2019   S/P carpal tunnel release 08/30/2019   Seasonal allergies 10/08/2016   Shingles 11/14/2020   Sinusitis, bacterial 10/07/2021    Family History  Problem Relation Age of Onset   Diabetes Mother    Osteoarthritis Mother    Hypertension Mother    Hyperlipidemia Mother    Arthritis Mother     Past Surgical History:  Procedure Laterality Date   ABDOMINAL HYSTERECTOMY  10/17/2019   APPENDECTOMY     CARPAL TUNNEL RELEASE Right 07/19/2019   Procedure: RIGHT CARPAL TUNNEL RELEASE;  Surgeon: Tarry Kos, MD;  Location: [facility];  Service:  Orthopedics;  Laterality: Right;   CARPAL TUNNEL RELEASE Left 09/13/2019   Procedure: LEFT CARPAL TUNNEL RELEASE;  Surgeon: Tarry Kos, MD;  Location: [facility];  Service: Orthopedics;  Laterality: Left;  Bier block   CESAREAN SECTION  1997; 2008   CHOLECYSTECTOMY  2011   SHOULDER ARTHROSCOPY WITH DISTAL CLAVICLE RESECTION  02/12/2021   Procedure: SHOULDER ARTHROSCOPY WITH DISTAL CLAVICLE RESECTION;  Surgeon: Tarry Kos, MD;  Location: [facility];  Service: Orthopedics;;   SHOULDER ARTHROSCOPY WITH ROTATOR CUFF REPAIR AND SUBACROMIAL DECOMPRESSION Left 02/12/2021   Procedure: LEFT SHOULDER ARTHROSCOPY WITH ROTATOR CUFF REPAIR, SUBACROMIAL DECOMPRESSION, EXTENSIVE DEBRIDEMENT;  Surgeon: Tarry Kos, MD;  Location: [facility];  Service: Orthopedics;  Laterality: Left;   TONSILLECTOMY     tonsilletomy     TUBAL LIGATION  04/2007   ULNAR TUNNEL RELEASE Right 12/17/2021   Procedure: RIGHT CUBITAL TUNNEL RELEASE;  Surgeon: Tarry Kos, MD;  Location: [facility];  Service: Orthopedics;  Laterality: Right;   VAGINAL HYSTERECTOMY Bilateral 10/17/2019   Procedure: HYSTERECTOMY VAGINAL WITH SALPINGECTOMY;  Surgeon: Hermina Staggers, MD;  Location: [facility];  Service: Gynecology;  Laterality: Bilateral;   Social History   Occupational History   Not on file  Tobacco Use   Smoking status: Never   Smokeless tobacco: Never  Vaping Use   Vaping Use: Never used  Substance and Sexual Activity   Alcohol use: No   Drug use: No   Sexual activity: Yes    Birth control/protection: Surgical

## 2022-12-02 ENCOUNTER — Other Ambulatory Visit: Payer: Self-pay

## 2022-12-02 ENCOUNTER — Ambulatory Visit (INDEPENDENT_AMBULATORY_CARE_PROVIDER_SITE_OTHER): Payer: Self-pay | Admitting: Sports Medicine

## 2022-12-02 ENCOUNTER — Ambulatory Visit (INDEPENDENT_AMBULATORY_CARE_PROVIDER_SITE_OTHER): Payer: Self-pay | Admitting: Orthopaedic Surgery

## 2022-12-02 ENCOUNTER — Encounter: Payer: Self-pay | Admitting: Orthopaedic Surgery

## 2022-12-02 DIAGNOSIS — M25511 Pain in right shoulder: Secondary | ICD-10-CM

## 2022-12-02 DIAGNOSIS — G8929 Other chronic pain: Secondary | ICD-10-CM

## 2022-12-02 MED ORDER — BUPIVACAINE HCL 0.25 % IJ SOLN
2.0000 mL | INTRAMUSCULAR | Status: AC | PRN
  Administered 2022-12-02: 2 mL via INTRA_ARTICULAR

## 2022-12-02 MED ORDER — LIDOCAINE HCL 1 % IJ SOLN
2.0000 mL | INTRAMUSCULAR | Status: AC | PRN
  Administered 2022-12-02: 2 mL

## 2022-12-02 MED ORDER — METHYLPREDNISOLONE ACETATE 40 MG/ML IJ SUSP
40.0000 mg | INTRAMUSCULAR | Status: AC | PRN
  Administered 2022-12-02: 40 mg via INTRA_ARTICULAR

## 2022-12-02 NOTE — Progress Notes (Signed)
Office Visit Note   Patient: Alexandra Norris           Date of Birth: [birth year]           MRN: [id number] Visit Date: 12/02/2022              Requested by: de Peru, Raymond J, MD [address] [place],  [place] [address] PCP: de Peru, Raymond J, MD   Assessment & Plan: Visit Diagnoses:  1. Chronic right shoulder pain     Plan: Impression is chronic right shoulder pain.  Although the patient does have evidence of partial thickness supraspinatus tear I do not feel her symptoms are coming from this area.  She seems to have more pain localized to the anterior shoulder.  I would like to refer her to Dr. Shon Baton for ultrasound-guided glenohumeral cortisone injection.  We have also discussed starting her in physical therapy for which she is agreeable to.  Referral has been made.  Follow-up as needed.  Interpreter present today which increased complexity of the visit.  Follow-Up Instructions: Return if symptoms worsen or fail to improve.   Orders:  Orders Placed This Encounter  Procedures   Ambulatory referral to Physical Therapy   No orders of the defined types were placed in this encounter.     Procedures: No procedures performed   Clinical Data: No additional findings.   Subjective: Chief Complaint  Patient presents with   Right Knee - Pain   Right Shoulder - Pain    HPI patient is a pleasant 47 year old Spanish-speaking female who is here today with an interpreter.  She is here with right shoulder pain.  She is status post right shoulder arthroscopic debridement, decompression distal clavicle excision and biceps tenotomy 06/25/2021.  She was initially doing well and then had recurrent right shoulder pain.  Repeat MRI was performed in December 2023 which showed moderate tendinosis of the supraspinatus with bursal surface tearing in addition to a new SLAP tear.  She was referred to Dr. Shon Baton for [facility] joint injection.  She had relief in pain for about 2 months.   Symptoms have returned.  Majority of her symptoms are to the anterior aspect and occur when she is lifting her arm or lying on her right side.  No weakness.  She does note paresthesias to all 5 fingers.  History of cervical spine radiculopathy for which she has seen Dr. Christell Constant.  Review of Systems as detailed in HPI.  All others reviewed and are negative.   Objective: Vital Signs: LMP 09/20/2019 (Exact Date)   Physical Exam well-developed well-nourished female no acute distress.  Alert and oriented x 3.  Ortho Exam right shoulder exam: Active forward flexion to about 90 degrees.  I can passively get her to about 160 degrees.  Near full external rotation.  She can internally rotate to L5.  No pain with empty can testing.  She does have pain with O'Brien testing.  Full strength throughout.  She is neurovascularly intact distally.  Specialty Comments:  No specialty comments available.  Imaging: No new imaging   PMFS History: Patient Active Problem List   Diagnosis Date Noted   Uncontrolled type 2 diabetes mellitus with hyperglycemia (HCC) 07/28/2022   Influenza 06/05/2022   Cervical post-laminectomy syndrome 03/06/2022   Cervical radiculopathy 03/05/2022   Snoring 01/20/2022   Impingement syndrome of right shoulder 12/30/2021   Cubital tunnel syndrome on right 12/02/2021   Asthma due to seasonal allergies 09/25/2021   Cough 07/15/2021  S/P arthroscopy of right shoulder 07/08/2021   Type 1 superior labral anterior-to-posterior (SLAP) tear of right shoulder 04/24/2021   Arthritis of right acromioclavicular joint 04/24/2021   Chronic left shoulder pain 04/22/2021   Hypertension associated with diabetes (HCC) 04/22/2021   Hyperlipidemia associated with type 2 diabetes mellitus (HCC) 04/22/2021   Nontraumatic tear of left supraspinatus tendon 02/12/2021   Encounter to establish care 09/09/2020   Fatty liver disease, nonalcoholic 09/09/2020   Cervical disc disorder at C5-C6 level with  radiculopathy 04/03/2020   Arthritis    Nasal obstruction 02/18/2016   DM2 (diabetes mellitus, type 2) (HCC) 10/21/2015   Vitamin D deficiency 09/25/2015   Anxiety and depression 07/06/2014   GASTROESOPHAGEAL REFLUX DISEASE 08/14/2009   Body mass index (BMI) 45.0-49.9, adult (HCC) 05/30/2007   Past Medical History:  Diagnosis Date   Anemia    Anemia    Anxiety    Arthritis    Asthma    Bilateral chronic knee pain 09/25/2015   Carpal tunnel syndrome    Depression    DEPRESSION, SITUATIONAL, PROLONGED 02/05/2009   Qualifier: Diagnosis of  By: Wallene Huh  MD, Khary     Diabetes mellitus without complication (HCC)    Dizziness 04/22/2021   Dyspnea    Elevated blood-pressure reading, without diagnosis of hypertension 06/17/2020   Endometriosis    Fatigue 04/22/2021   History of blood transfusion 1999   w/vaginal delivery   History of bronchitis    "I've stayed in hospital 2 X w/bronchitis"   Hyperlipidemia    Hypertension    Impingement syndrome of left shoulder    Influenza vaccine needed 04/03/2020   Left carpal tunnel syndrome 07/11/2019   Migraine    Moderate persistent asthma with acute exacerbation 12/06/2006   Qualifier: Diagnosis of  By: Wallene Huh  MD, Khary     Neck pain 10/14/2020   Pneumonia    PONV (postoperative nausea and vomiting)    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   Pre-diabetes 10/03/2019   A1C 6.4   Right carpal tunnel syndrome 07/11/2019   S/P carpal tunnel release 08/30/2019   Seasonal allergies 10/08/2016   Shingles 11/14/2020   Sinusitis, bacterial 10/07/2021    Family History  Problem Relation Age of Onset   Diabetes Mother    Osteoarthritis Mother    Hypertension Mother    Hyperlipidemia Mother    Arthritis Mother     Past Surgical History:  Procedure Laterality Date   ABDOMINAL HYSTERECTOMY  10/17/2019   APPENDECTOMY     CARPAL TUNNEL RELEASE Right 07/19/2019   Procedure: RIGHT CARPAL TUNNEL RELEASE;  Surgeon: Tarry Kos, MD;  Location:  [facility];  Service: Orthopedics;  Laterality: Right;   CARPAL TUNNEL RELEASE Left 09/13/2019   Procedure: LEFT CARPAL TUNNEL RELEASE;  Surgeon: Tarry Kos, MD;  Location: [facility];  Service: Orthopedics;  Laterality: Left;  Bier block   CESAREAN SECTION  1997; 2008   CHOLECYSTECTOMY  2011   SHOULDER ARTHROSCOPY WITH DISTAL CLAVICLE RESECTION  02/12/2021   Procedure: SHOULDER ARTHROSCOPY WITH DISTAL CLAVICLE RESECTION;  Surgeon: Tarry Kos, MD;  Location: [facility];  Service: Orthopedics;;   SHOULDER ARTHROSCOPY WITH ROTATOR CUFF REPAIR AND SUBACROMIAL DECOMPRESSION Left 02/12/2021   Procedure: LEFT SHOULDER ARTHROSCOPY WITH ROTATOR CUFF REPAIR, SUBACROMIAL DECOMPRESSION, EXTENSIVE DEBRIDEMENT;  Surgeon: Tarry Kos, MD;  Location: [facility];  Service: Orthopedics;  Laterality: Left;   TONSILLECTOMY     tonsilletomy     TUBAL  LIGATION  04/2007   ULNAR TUNNEL RELEASE Right 12/17/2021   Procedure: RIGHT CUBITAL TUNNEL RELEASE;  Surgeon: Tarry Kos, MD;  Location: [facility];  Service: Orthopedics;  Laterality: Right;   VAGINAL HYSTERECTOMY Bilateral 10/17/2019   Procedure: HYSTERECTOMY VAGINAL WITH SALPINGECTOMY;  Surgeon: Hermina Staggers, MD;  Location: [facility];  Service: Gynecology;  Laterality: Bilateral;   Social History   Occupational History   Not on file  Tobacco Use   Smoking status: Never   Smokeless tobacco: Never  Vaping Use   Vaping Use: Never used  Substance and Sexual Activity   Alcohol use: No   Drug use: No   Sexual activity: Yes    Birth control/protection: Surgical

## 2022-12-02 NOTE — Progress Notes (Signed)
   Procedure Note  Patient: Alexandra Norris             Date of Birth: 1976-03-18           MRN: [id number]             Visit Date: 12/02/2022  Procedures: Visit Diagnoses:  1. Chronic right shoulder pain    Large Joint Inj: R glenohumeral on 12/02/2022 9:04 AM Indications: pain Details: 22 G 3.5 in needle, ultrasound-guided posterior approach Medications: 2 mL lidocaine 1 %; 2 mL bupivacaine 0.25 %; 40 mg methylPREDNISolone acetate 40 MG/ML Outcome: tolerated well, no immediate complications  US-guided glenohumeral joint injection, right   shoulder After discussion on risks/benefits/indications, informed verbal consent was obtained. A timeout was then performed. The patient was positioned lying lateral recumbent on examination table. The patient's shoulder was prepped with betadine and multiple alcohol swabs and utilizing ultrasound guidance, the patient's glenohumeral joint was identified on ultrasound. Using ultrasound guidance a 22-gauge, 3.5 inch needle with a mixture of 2:2:1 cc's lidocaine:bupivicaine:depomedrol was directed from a lateral to medial direction via in-plane technique into the glenohumeral joint with visualization of appropriate spread of injectate into the joint. Patient tolerated the procedure well without immediate complications.      Procedure, treatment alternatives, risks and benefits explained, specific risks discussed. Consent was given by the patient. Immediately prior to procedure a time out was called to verify the correct patient, procedure, equipment, support staff and site/side marked as required. Patient was prepped and draped in the usual sterile fashion.    - I evaluated the patient about 5 minutes post-injection and they had improvement in pain and range of motion - follow-up with Mardella Layman or Dr. Roda Shutters as indicated; I am happy to see them as needed  Madelyn Brunner, DO Primary Care Sports Medicine Physician  [facility] -  Orthopedics  This note was dictated using Dragon naturally speaking software and may contain errors in syntax, spelling, or content which have not been identified prior to signing this note.

## 2022-12-28 ENCOUNTER — Ambulatory Visit: Payer: Self-pay | Admitting: Physician Assistant

## 2023-01-18 ENCOUNTER — Other Ambulatory Visit: Payer: Self-pay

## 2023-01-18 MED ORDER — MUPIROCIN 2 % EX OINT
1.0000 | TOPICAL_OINTMENT | Freq: Three times a day (TID) | CUTANEOUS | 0 refills | Status: DC
  Filled 2023-01-18: qty 22, 8d supply, fill #0

## 2023-01-18 MED ORDER — IBUPROFEN 800 MG PO TABS
800.0000 mg | ORAL_TABLET | Freq: Three times a day (TID) | ORAL | 0 refills | Status: DC | PRN
  Filled 2023-01-18 – 2023-07-26 (×2): qty 30, 10d supply, fill #0

## 2023-01-18 MED ORDER — METHOCARBAMOL 500 MG PO TABS
1000.0000 mg | ORAL_TABLET | Freq: Four times a day (QID) | ORAL | 0 refills | Status: DC | PRN
  Filled 2023-01-18: qty 40, 5d supply, fill #0

## 2023-01-21 ENCOUNTER — Other Ambulatory Visit (INDEPENDENT_AMBULATORY_CARE_PROVIDER_SITE_OTHER): Payer: Self-pay

## 2023-01-21 ENCOUNTER — Ambulatory Visit: Payer: Self-pay | Admitting: Physician Assistant

## 2023-01-21 ENCOUNTER — Encounter: Payer: Self-pay | Admitting: Physician Assistant

## 2023-01-21 DIAGNOSIS — M25511 Pain in right shoulder: Secondary | ICD-10-CM

## 2023-01-21 DIAGNOSIS — Z9889 Other specified postprocedural states: Secondary | ICD-10-CM

## 2023-01-21 DIAGNOSIS — M25561 Pain in right knee: Secondary | ICD-10-CM
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
## 2023-01-27 ENCOUNTER — Other Ambulatory Visit: Payer: Self-pay

## 2023-02-04 ENCOUNTER — Encounter (HOSPITAL_BASED_OUTPATIENT_CLINIC_OR_DEPARTMENT_OTHER): Payer: Self-pay | Admitting: Family Medicine

## 2023-02-04 ENCOUNTER — Other Ambulatory Visit (HOSPITAL_BASED_OUTPATIENT_CLINIC_OR_DEPARTMENT_OTHER): Payer: Self-pay

## 2023-02-04 ENCOUNTER — Ambulatory Visit (INDEPENDENT_AMBULATORY_CARE_PROVIDER_SITE_OTHER): Payer: Self-pay | Admitting: Family Medicine

## 2023-02-04 VITALS — BP 112/72 | HR 96 | Ht 61.0 in | Wt 232.5 lb

## 2023-02-04 DIAGNOSIS — R21 Rash and other nonspecific skin eruption: Secondary | ICD-10-CM

## 2023-02-04 DIAGNOSIS — N898 Other specified noninflammatory disorders of vagina: Secondary | ICD-10-CM

## 2023-02-04 MED ORDER — FLUCONAZOLE 150 MG PO TABS
150.0000 mg | ORAL_TABLET | Freq: Once | ORAL | 0 refills | Status: AC
  Filled 2023-02-04: qty 2, 2d supply, fill #0

## 2023-02-04 MED ORDER — NYSTATIN 100000 UNIT/GM EX POWD
1.0000 | Freq: Three times a day (TID) | CUTANEOUS | 0 refills | Status: DC
  Filled 2023-02-04: qty 15, 5d supply, fill #0

## 2023-02-04 NOTE — Progress Notes (Signed)
Acute Office Visit  Subjective:     Patient ID: Alexandra Norris, female    DOB: 08/12/1975, 47 y.o.   MRN: [id number]  Chief Complaint  Patient presents with   Blister    Pt states she has not been able to control her blood sugar for the past 2 weeks. Also has some cuts in the private area that is bothersome. Pt also tested positive for covid 4 days ago.   Alexandra Norris is a 47 year-old female patient who presents today for concerns regarding possible vaginal yeast and uncontrolled blood sugars.   When she has a tough time controlling blood sugars, she reports that she does notice a rash in her groin area. She denies blisters on vagina and reports an itchy rash. Her blood sugars have been around 270-320 mg/dL. Fasting around 260 mg/dL.   A1c last checked 9.0 about 1 year ago   Review of Systems  Constitutional:  Negative for malaise/fatigue.  Eyes:  Negative for blurred vision and double vision.  Respiratory:  Negative for cough and shortness of breath.   Cardiovascular:  Negative for chest pain, palpitations and leg swelling.  Gastrointestinal:  Negative for abdominal pain, nausea and vomiting.  Genitourinary:  Negative for frequency and urgency.  Musculoskeletal:  Negative for myalgias.  Skin:  Positive for itching (vaginal) and rash.  Neurological:  Negative for dizziness, weakness and headaches.  Endo/Heme/Allergies:  Negative for polydipsia.  Psychiatric/Behavioral:  Negative for depression and suicidal ideas. The patient is not nervous/anxious.        Objective:    BP 112/72 (BP Location: Right Arm, Patient Position: Sitting, Cuff Size: Large)   Pulse 96   Ht 5\' 1"  (1.549 m)   Wt 232 lb 8 oz (105.5 kg)   LMP 09/20/2019 (Exact Date)   SpO2 97%   BMI 43.93 kg/m   Physical Exam Exam conducted with a chaperone present.  Constitutional:      Appearance: Normal appearance.  Cardiovascular:     Rate and Rhythm: Normal rate and regular  rhythm.     Pulses: Normal pulses.     Heart sounds: Normal heart sounds.  Pulmonary:     Effort: Pulmonary effort is normal.     Breath sounds: Normal breath sounds.  Genitourinary:    Exam position: Lithotomy position.     Pubic Area: Rash present.     Labia:        Right: Rash, tenderness and lesion present. No injury.        Left: Rash, tenderness and lesion present. No injury.        Comments: Yeast-like rash present on outer portion of labia majora (in red on diagram) Open erythematous lesions present on labia minora (in yellow on diagram)  Lymphadenopathy:     Lower Body: No right inguinal adenopathy. No left inguinal adenopathy.  Neurological:     Mental Status: She is alert.  Psychiatric:        Mood and Affect: Mood normal.        Behavior: Behavior normal.     Assessment & Plan:   1. Vaginal itching Patient reports vaginal itching and burning for the past 2 weeks. Denies urinary urgency, frequency, dysuria.  Denies polydipsia, polyphagia, and polyuria. Jolyne Loa, CMA present as chaperone. Test for HSV in blood and culture, along with NuSwab for yeast/BV.  - Herpes simplex virus culture - HgB A1c - HSV 1 and 2 Ab, IgG - NuSwab Vaginitis Plus (VG+)  2.  Rash Chaperone present for duration of GU exam. Rash present on outer part of groin in the area on the outer portion of labia majora and. Thickness to skin with yellowish/white presence. Patient reports tenderness and pain with palpation. Open red lesions present on inner portion of labia minora. Patient reports these were painful to palpation. No lymphoadenopathy present. No injury present. Will provide patient with PO and topical fungal treatment. Advised her to continue topical until rash resolves. 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 Will update patient with results once returned.  - fluconazole (DIFLUCAN) 150 MG tablet; Take 1 tablet (150 mg total) by mouth once for 1 dose. Take 1 tablet (150mg ) by  mouth once. If continue to have symptoms within 72 hours, repeat dose.  Dispense: 2 tablet; Refill: 0 - nystatin (MYCOSTATIN/NYSTOP) powder; Apply 1 Application topically 3 (three) times daily.  Dispense: 15 g; Refill: 0   Return in about 3 months (around 05/07/2023) for Diabetes f/u.  Alyson Reedy, FNP

## 2023-02-05 LAB — HSV 1 AND 2 AB, IGG
HSV 1 Glycoprotein G Ab, IgG: 28.9 {index} — ABNORMAL HIGH (ref 0.00–0.90)
HSV 2 IgG, Type Spec: <0.91 {index} (ref 0.00–0.90)

## 2023-02-05 LAB — HEMOGLOBIN A1C
Est. average glucose Bld gHb Est-mCnc: 309 mg/dL
Hgb A1c MFr Bld: 12.4 % — ABNORMAL HIGH (ref 4.8–5.6)

## 2023-02-09 ENCOUNTER — Telehealth (HOSPITAL_BASED_OUTPATIENT_CLINIC_OR_DEPARTMENT_OTHER): Payer: Self-pay

## 2023-02-09 NOTE — Telephone Encounter (Signed)
Patient presented in office, with complaints of blood sugar running in the high 400's, states Trulicity is too expensive for her to afford ($800 roughly). Spoke with PCP, pcp stated if patient is having symptoms to present to ED to monitor for possible DKA. Patient is having symptoms of vision changes, headache, N&V. PCP will send in insulin prescription, PCP out of office at the moment spoke via telephone given these recommendations over the phone. Patient agreeable to presenting to ED, pcp made aware of decision and updated as far as what was discussed in office with patient.

## 2023-02-10 LAB — NUSWAB VAGINITIS PLUS (VG+)
Candida albicans, NAA: POSITIVE — AB
Candida glabrata, NAA: NEGATIVE
Chlamydia trachomatis, NAA: NEGATIVE
Neisseria gonorrhoeae, NAA: NEGATIVE
Trich vag by NAA: NEGATIVE

## 2023-02-10 LAB — HERPES SIMPLEX VIRUS CULTURE

## 2023-02-11 ENCOUNTER — Ambulatory Visit: Payer: Self-pay | Admitting: Orthopaedic Surgery

## 2023-02-12 ENCOUNTER — Ambulatory Visit (HOSPITAL_BASED_OUTPATIENT_CLINIC_OR_DEPARTMENT_OTHER): Payer: Self-pay | Admitting: Family Medicine

## 2023-02-15 ENCOUNTER — Other Ambulatory Visit (HOSPITAL_BASED_OUTPATIENT_CLINIC_OR_DEPARTMENT_OTHER): Payer: Self-pay | Admitting: Family Medicine

## 2023-02-16 ENCOUNTER — Ambulatory Visit (INDEPENDENT_AMBULATORY_CARE_PROVIDER_SITE_OTHER): Payer: Self-pay | Admitting: Orthopaedic Surgery

## 2023-02-16 ENCOUNTER — Encounter (HOSPITAL_BASED_OUTPATIENT_CLINIC_OR_DEPARTMENT_OTHER): Payer: Self-pay | Admitting: Family Medicine

## 2023-02-16 ENCOUNTER — Telehealth: Payer: Self-pay | Admitting: *Deleted

## 2023-02-16 ENCOUNTER — Ambulatory Visit (INDEPENDENT_AMBULATORY_CARE_PROVIDER_SITE_OTHER): Payer: Self-pay | Admitting: Family Medicine

## 2023-02-16 ENCOUNTER — Other Ambulatory Visit (HOSPITAL_BASED_OUTPATIENT_CLINIC_OR_DEPARTMENT_OTHER): Payer: Self-pay

## 2023-02-16 VITALS — BP 133/72 | HR 90 | Ht 61.0 in | Wt 228.4 lb

## 2023-02-16 DIAGNOSIS — M25561 Pain in right knee: Secondary | ICD-10-CM

## 2023-02-16 DIAGNOSIS — Z7984 Long term (current) use of oral hypoglycemic drugs: Secondary | ICD-10-CM

## 2023-02-16 DIAGNOSIS — E1165 Type 2 diabetes mellitus with hyperglycemia: Secondary | ICD-10-CM

## 2023-02-16 DIAGNOSIS — H00012 Hordeolum externum right lower eyelid: Secondary | ICD-10-CM

## 2023-02-16 DIAGNOSIS — R21 Rash and other nonspecific skin eruption: Secondary | ICD-10-CM

## 2023-02-16 DIAGNOSIS — G8929 Other chronic pain: Secondary | ICD-10-CM

## 2023-02-16 DIAGNOSIS — M25511 Pain in right shoulder: Secondary | ICD-10-CM

## 2023-02-16 LAB — POCT CBG (FASTING - GLUCOSE)-MANUAL ENTRY: Glucose Fasting, POC: 329 mg/dL — AB (ref 70–99)

## 2023-02-16 MED ORDER — FLUCONAZOLE 150 MG PO TABS
ORAL_TABLET | ORAL | 0 refills | Status: DC
  Filled 2023-02-16: qty 2, 3d supply, fill #0

## 2023-02-16 MED ORDER — METFORMIN HCL 500 MG PO TABS
ORAL_TABLET | ORAL | 3 refills | Status: DC
  Filled 2023-02-16 – 2023-03-04 (×2): qty 270, 68d supply, fill #0
  Filled 2023-03-04: qty 270, 68d supply, fill #1
  Filled 2023-06-22: qty 270, 68d supply, fill #0
  Filled 2023-10-13: qty 120, 30d supply, fill #1
  Filled 2023-12-24: qty 120, 30d supply, fill #2

## 2023-02-16 MED ORDER — NYSTATIN 100000 UNIT/GM EX POWD
1.0000 | Freq: Three times a day (TID) | CUTANEOUS | 0 refills | Status: DC
  Filled 2023-02-16: qty 15, 5d supply, fill #0

## 2023-02-16 MED ORDER — INSULIN DEGLUDEC 100 UNIT/ML ~~LOC~~ SOLN
10.0000 [IU] | Freq: Every day | SUBCUTANEOUS | 0 refills | Status: DC
  Filled 2023-02-16: qty 10, 100d supply, fill #0

## 2023-02-16 MED ORDER — INSULIN STARTER KIT- PEN NEEDLES (SPANISH): 1.0000 | Freq: Once | 0 refills | Status: AC

## 2023-02-16 MED ORDER — INSULIN NPH (HUMAN) (ISOPHANE) 100 UNIT/ML ~~LOC~~ SUSP
10.0000 [IU] | Freq: Every day | SUBCUTANEOUS | 3 refills | Status: DC
Start: 2023-02-16 — End: 2023-02-22

## 2023-02-16 NOTE — Progress Notes (Signed)
  Care Coordination   Note   02/16/2023 Name: Alexandra Norris MRN: [id number] DOB: [birth year]  Alexandra Norris is a 47 y.o. year old female who sees Alyson Reedy, FNP for primary care. I reached out to Vonda Antigua by phone today via interpreter services in response to a referral  Follow up plan:  Telephone appointment with care coordination team member scheduled for:  02/24/2023  Encounter Outcome:  Patient Scheduled  Burman Nieves, [facility] Care Coordination Care Guide Direct Dial: [phone]
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
## 2023-02-16 NOTE — Assessment & Plan Note (Signed)
Patient presents to the office today for uncontrolled type II diabetes mellitus with home blood glucose readings in the 300-400s. Denies polyuria, polydipsia, and polyphagia. POCT fasting BGL today 329 mg/dL. Recent hemoglobin A1c 12.4. Reports taking metformin as prescribed. Plan to increase metformin to maximum dose- 1,000mg  in the morning and 1,000mg  in the evening. Due to financial strain, issues with determining which long-acting insulin will be the cheapest option. At this time, NPH insulin will be cheapest option- sent to [facility] pharmacy. Educated patient to administer 10 unit subcutaneously once daily at bedtime, rotating injection sites. Counseled about frequency of blood glucose monitoring, target blood glucose range, symptoms and management of hypoglycemia, and starting insulin dose at 10 units at bedtime. Patient verbalized understanding of plan of care and will return to office for in-depth administration education this week.

## 2023-02-16 NOTE — Assessment & Plan Note (Signed)
Patient reports she never picked up the nystatin cream from her previous office visit. Review of NuSwab, patient with vaginal candidiasis. Prescribed diflucan. Patient verbalized understanding and medication instructions.

## 2023-02-16 NOTE — Progress Notes (Signed)
Established Patient Office Visit  Subjective   Patient ID: Alexandra Norris, female    DOB: January 19, 1976  Age: 47 y.o. MRN: [id number]  DIABETES MELLITUS: Alexandra Norris is a 47 year-old female patient who presents for the medical management of diabetes. Barriers to care include financial strain. Current diabetes medication regimen: metformin 500mg  in the morning & 1000mg  in the evening  Patient is not checking BS regularly since she ran out of strips. When she has checked it in the past week, her BGs are usually in the 300-400s Fasting sugar today: 329 mg/dL  Reports she is still suffering from groin pain/itchiness, dry mouth and nausea.  Denies polydipsia, polyphagia, polyuria, weight loss, open wounds or ulcers on feet.   Lab Results  Component Value Date   HGBA1C 12.4 (H) 02/04/2023    04/22/2021 Lab Results  Component Value Date   LABMICR 6.2 08/07/2020   MICROALBUR 10 04/22/2021    Wt Readings from Last 3 Encounters:  02/16/23 228 lb 6.4 oz (103.6 kg)  02/04/23 232 lb 8 oz (105.5 kg)  07/28/22 233 lb 14.5 oz (106.1 kg)    Review of Systems  Constitutional:  Negative for malaise/fatigue and weight loss.  Eyes:  Positive for pain (R eye- feels like stye). Negative for blurred vision and double vision.  Respiratory:  Negative for cough and shortness of breath.   Cardiovascular:  Negative for chest pain, palpitations and leg swelling.  Gastrointestinal:  Negative for abdominal pain, nausea and vomiting.  Genitourinary:  Negative for frequency and urgency.  Musculoskeletal:  Negative for myalgias.  Neurological:  Negative for dizziness, weakness and headaches.  Endo/Heme/Allergies:  Negative for polydipsia.  Psychiatric/Behavioral:  Negative for depression, substance abuse and suicidal ideas. The patient is not nervous/anxious and does not have insomnia.      Objective:     BP 133/72   Pulse 90   Ht 5\' 1"  (1.549 m)   Wt 228 lb 6.4 oz  (103.6 kg)   LMP 09/20/2019 (Exact Date)   SpO2 96%   BMI 43.16 kg/m  BP Readings from Last 3 Encounters:  02/16/23 133/72  02/04/23 112/72  07/28/22 (!) 114/57     Physical Exam Constitutional:      Appearance: Normal appearance.  Eyes:     General: Lids are normal.        Right eye: Hordeolum (tender to palpation with slight erythema) present.  Cardiovascular:     Rate and Rhythm: Normal rate and regular rhythm.     Pulses: Normal pulses.     Heart sounds: Normal heart sounds.  Pulmonary:     Effort: Pulmonary effort is normal.     Breath sounds: Normal breath sounds.  Neurological:     Mental Status: She is alert.  Psychiatric:        Mood and Affect: Mood normal.        Behavior: Behavior normal.     Assessment & Plan:  Uncontrolled type 2 diabetes mellitus with hyperglycemia (HCC) Assessment & Plan: Patient presents to the office today for uncontrolled type II diabetes mellitus with home blood glucose readings in the 300-400s. Denies polyuria, polydipsia, and polyphagia. POCT fasting BGL today 329 mg/dL. Recent hemoglobin A1c 12.4. Reports taking metformin as prescribed. Plan to increase metformin to maximum dose- 1,000mg  in the morning and 1,000mg  in the evening. Due to financial strain, issues with determining which long-acting insulin will be the cheapest option. At this time, NPH insulin will be cheapest option- sent  to [facility] pharmacy. Educated patient to administer 10 unit subcutaneously once daily at bedtime, rotating injection sites. Counseled about frequency of blood glucose monitoring, target blood glucose range, symptoms and management of hypoglycemia, and starting insulin dose at 10 units at bedtime. Patient verbalized understanding of plan of care and will return to office for in-depth administration education this week.   Orders: -     POCT CBG (Fasting - Glucose) -     metFORMIN HCl; Take 2 tablets (1,000 mg total) by mouth daily with breakfast AND 2 tablets  (1,000 mg total) every evening.  Dispense: 270 tablet; Refill: 3 -     insulin starter kit- pen needles; 1 kit by Other route once for 1 dose.  Dispense: 1 kit; Refill: 0 -     Insulin NPH (Human) (Isophane); Inject 0.1 mLs (10 Units total) into the skin at bedtime.  Dispense: 3 mL; Refill: 3  Rash Assessment & Plan: Patient reports she never picked up the nystatin cream from her previous office visit. Review of NuSwab, patient with vaginal candidiasis. Prescribed diflucan. Patient verbalized understanding and medication instructions.   Orders: -     Nystatin; Apply 1 Application topically 3 (three) times daily.  Dispense: 15 g; Refill: 0 -     Fluconazole; Take 1 tablet today. Within 72 hours, take the second tablet.  Dispense: 2 tablet; Refill: 0  Hordeolum externum of right lower eyelid Assessment & Plan: Patient presents with small hordeolum on right lower eyelid. Tenderness to palpation with small erythematous bump, but no signs of bacterial infection. No purulent drainage present. No indication for antibiotics at this time. Advised patient to apply warm compress to affected eye for 10-15 minutes a day about 3-4 times per day and gently massage the eyelid after applying the warm compress to facilitate drainage. Avoid touching or rubbing the eye, attempting to squeeze the stye, and use of NSAIDs for pain relief.      Return in about 1 week (around 02/23/2023) for Diabetes f/u.    Alexandra Reedy, FNP

## 2023-02-16 NOTE — Patient Instructions (Signed)
LONG ACTING INSULIN INSTRUCTIONS  Check your blood sugar every day. It would be best if you could keep a notebook of dates, blood sugar values, and how much insulin you use. Bring this to your next visit.   If your blood sugar is less than 100, subtract 2 units from the previous day's dose.   If your sugar is between 100-200, you will give yourself the same dose of insulin as the previous day.   If your blood sugar is over 200, you should add 2 units to your previous day's dose.   - For Example: Yesterday you gave yourself 20 units of Insulin. Today your blood sugar is 235. You should give yourself 22 units today. If the next day your sugar is 215, you would give yourself 24 units total.     Sugar    Insulin Dosing  0-100    Subtract 2 units from previous days' dose 101-200   Keep the same dose as the previous day 200 or more   Add 2 units to the previous days dose.

## 2023-02-16 NOTE — Assessment & Plan Note (Signed)
Patient presents with small hordeolum on right lower eyelid. Tenderness to palpation with small erythematous bump, but no signs of bacterial infection. No purulent drainage present. No indication for antibiotics at this time. Advised patient to apply warm compress to affected eye for 10-15 minutes a day about 3-4 times per day and gently massage the eyelid after applying the warm compress to facilitate drainage. Avoid touching or rubbing the eye, attempting to squeeze the stye, and use of NSAIDs for pain relief.

## 2023-02-18 ENCOUNTER — Ambulatory Visit (HOSPITAL_BASED_OUTPATIENT_CLINIC_OR_DEPARTMENT_OTHER): Payer: Self-pay | Admitting: Family Medicine

## 2023-02-22 ENCOUNTER — Encounter (HOSPITAL_BASED_OUTPATIENT_CLINIC_OR_DEPARTMENT_OTHER): Payer: Self-pay | Admitting: Family Medicine

## 2023-02-22 ENCOUNTER — Ambulatory Visit (INDEPENDENT_AMBULATORY_CARE_PROVIDER_SITE_OTHER): Payer: Self-pay | Admitting: Family Medicine

## 2023-02-22 VITALS — BP 105/57 | HR 86 | Ht 61.0 in | Wt 227.9 lb

## 2023-02-22 DIAGNOSIS — E1165 Type 2 diabetes mellitus with hyperglycemia: Secondary | ICD-10-CM

## 2023-02-22 DIAGNOSIS — Z7984 Long term (current) use of oral hypoglycemic drugs: Secondary | ICD-10-CM

## 2023-02-22 DIAGNOSIS — Z1211 Encounter for screening for malignant neoplasm of colon: Secondary | ICD-10-CM

## 2023-02-22 DIAGNOSIS — Z23 Encounter for immunization: Secondary | ICD-10-CM

## 2023-02-22 LAB — POCT GLUCOSE (DEVICE FOR HOME USE): POC Glucose: 313 mg/dL — AB (ref 70–99)

## 2023-02-22 MED ORDER — INSULIN NPH (HUMAN) (ISOPHANE) 100 UNIT/ML ~~LOC~~ SUSP: SUBCUTANEOUS | 3 refills | Status: DC

## 2023-02-22 NOTE — Patient Instructions (Addendum)
*  Starting this evening (02/22/2023) continue to give yourself 14 units.  In the morning, check your fasting blood glucose level.  If it is still reading in the 300 range, increase your dose by 4 total units (2 units in the morning and 2 in the evening). After adjusting your dose, maintain the same regimen for the next 2 days. Please let me know if you have any hypoglycemic events (blood sugar readings 80-100 mg/dL). We will check in within 2 weeks.  *Starting tomorrow (02/23/2023), plan to split your evening dose into morning and evening. Give yourself 10 units in the morning and 4 units in the evening.

## 2023-02-22 NOTE — Assessment & Plan Note (Signed)
Patient presents with fasting blood sugar levels in the 300s and some readings after eating were in the 400s-500s. Patient reports she does notice occasional headaches and some nausea. Advised patient to at least continue to check blood glucose fasting in the morning and two-hours after her largest meal. Discussed importance of diabetic diet. Educated  patient about continuing monitoring of her blood sugars throughout the day, hypoglycemic management, and timing of doses and meals. Discussed splitting dose of NPH to morning and evening administration. Advised patient to titrate dose based on BG results every 2 days. Advised patient to take 14 units of insulin in the evening tonight and based on her fasting BG tomorrow morning, increase AM dose (10 units) by 2 units and increase her PM dose (4 units) by 2 units. Patient verbalized understanding and will send a picture of her BG log in 2 weeks through MyChart.

## 2023-02-22 NOTE — Progress Notes (Signed)
Established Patient Office Visit  Subjective   Patient ID: Alexandra Norris, female    DOB: July 24, 1975  Age: 47 y.o. MRN: [id number]  DIABETES MELLITUS: Alexandra Norris is a pleasant 47 year-old female patient who presents for the medical management of diabetes.  Current diabetes medication regimen: NPH 12 units at bedtime  Patient is adhering to a diabetic diet. She cut out sugary foods and drinks, is not eating after 6pm, and is drinking a lot of water.  Patient is checking BS regularly. Avg: 300, some readings are in the 400s after eating  Reports she is still experiencing headaches and some nausea. Denies polydipsia, polyphagia, polyuria, open wounds or ulcers on feet.   Lab Results  Component Value Date   HGBA1C 12.4 (H) 02/04/2023    04/22/2021 Lab Results  Component Value Date   LABMICR 6.2 08/07/2020   MICROALBUR 10 04/22/2021    Wt Readings from Last 3 Encounters:  02/22/23 227 lb 14.4 oz (103.4 kg)  02/16/23 228 lb 6.4 oz (103.6 kg)  02/04/23 232 lb 8 oz (105.5 kg)     Review of Systems  Constitutional:  Negative for chills, fever and malaise/fatigue.  Eyes:  Negative for blurred vision and double vision.  Respiratory:  Negative for cough and shortness of breath.   Cardiovascular:  Negative for chest pain and leg swelling.  Gastrointestinal:  Positive for nausea. Negative for abdominal pain and vomiting.  Genitourinary:  Negative for frequency and urgency.  Musculoskeletal:  Negative for myalgias.  Neurological:  Positive for headaches. Negative for tingling and weakness.  Endo/Heme/Allergies:  Negative for polydipsia.  Psychiatric/Behavioral:  Negative for depression and suicidal ideas. The patient does not have insomnia.       Objective:     BP (!) 105/57   Pulse 86   Ht 5\' 1"  (1.549 m)   Wt 227 lb 14.4 oz (103.4 kg)   LMP 09/20/2019 (Exact Date)   SpO2 98%   BMI 43.06 kg/m  BP Readings from Last 3 Encounters:  02/22/23  (!) 105/57  02/16/23 133/72  02/04/23 112/72     Physical Exam Constitutional:      Appearance: Normal appearance.  Cardiovascular:     Rate and Rhythm: Normal rate and regular rhythm.     Pulses: Normal pulses.     Heart sounds: Normal heart sounds.  Pulmonary:     Effort: Pulmonary effort is normal.     Breath sounds: Normal breath sounds.  Neurological:     Mental Status: She is alert.  Psychiatric:        Mood and Affect: Mood normal.        Behavior: Behavior normal.     Assessment & Plan:  Uncontrolled type 2 diabetes mellitus with hyperglycemia (HCC) Assessment & Plan: Patient presents with fasting blood sugar levels in the 300s and some readings after eating were in the 400s-500s. Patient reports she does notice occasional headaches and some nausea. Advised patient to at least continue to check blood glucose fasting in the morning and two-hours after her largest meal. Discussed importance of diabetic diet. Educated  patient about continuing monitoring of her blood sugars throughout the day, hypoglycemic management, and timing of doses and meals. Discussed splitting dose of NPH to morning and evening administration. Advised patient to titrate dose based on BG results every 2 days. Advised patient to take 14 units of insulin in the evening tonight and based on her fasting BG tomorrow morning, increase AM dose (10 units)  by 2 units and increase her PM dose (4 units) by 2 units. Patient verbalized understanding and will send a picture of her BG log in 2 weeks through MyChart.   Orders: -     POCT Glucose (Device for Home Use) -     Insulin NPH (Human) (Isophane); Inject 0.1 mLs (10 Units total) into the skin daily before breakfast AND 0.04 mLs (4 Units total) at bedtime.  Dispense: 5 mL; Refill: 3  Screening for colorectal cancer -     Ambulatory referral to Gastroenterology  Encounter for immunization -     Flu vaccine trivalent PF, 6mos and  older(Flulaval,Afluria,Fluarix,Fluzone)     Return in about 4 weeks (around 03/22/2023) for Diabetes f/u.    Alexandra Reedy, FNP

## 2023-02-24 ENCOUNTER — Ambulatory Visit: Payer: Self-pay
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
## 2023-02-24 NOTE — Patient Instructions (Signed)
Visit Information  Thank you for taking time to visit with me today. Please don't hesitate to contact me if I can be of assistance to you.   Following are the goals we discussed today:  Patient to contact community food banks. Patient contact [employer] for payment arrangements SW to locate community clinic options with pharmacy services   Our next appointment is by telephone on 03/01/23 at 11am  Please call the care guide team at [phone] if you need to cancel or reschedule your appointment.   If you are experiencing a Mental Health or Behavioral Health Crisis or need someone to talk to, please call 911  Patient verbalizes understanding of instructions and care plan provided today and agrees to view in MyChart. Active MyChart status and patient understanding of how to access instructions and care plan via MyChart confirmed with patient.     Telephone follow up appointment with care management team member scheduled for: 03/01/23 at 11am.   Lysle Morales, BSW Social Worker [phone]

## 2023-02-26 ENCOUNTER — Encounter: Payer: Self-pay | Admitting: Gastroenterology

## 2023-03-01 ENCOUNTER — Ambulatory Visit: Payer: Self-pay

## 2023-03-01 NOTE — Patient Outreach (Signed)
Care Coordination   Follow Up Visit Note   03/01/2023 Name: Alexandra Norris MRN: [id number] DOB: 06/05/1976  Alexandra Norris is a 47 y.o. year old female who sees Alyson Reedy, FNP for primary care. I spoke with  Vonda Antigua and [employer] [phone] by phone today.  What matters to the patients health and wellness today?  Patient continues to lack resources to pay the power bill.  Patient reports [employer] agreed to accept $500 today but she doesn't have the money.    Goals Addressed             This Visit's Progress    Finanical Assistance       Interventions Today    Flowsheet Row Most Recent Value  General Interventions   General Interventions Discussed/Reviewed General Interventions Discussed, General Interventions Reviewed, [employer]. reports payment of $500 is due today but they don't have the money. Pt reports community resources can't help. Pt has spoke to her sister to help. SW provides [facility] with [facility] infor for medical/rx assistance.]              SDOH assessments and interventions completed:  No     Care Coordination Interventions:  Yes, provided   Follow up plan: No further intervention required.   Encounter Outcome:  Patient Visit Completed

## 2023-03-01 NOTE — Patient Instructions (Signed)
Visit Information  Thank you for taking time to visit with me today. Please don't hesitate to contact me if I can be of assistance to you.   Following are the goals we discussed today:  [facility] care and use pharmacy services.   If you are experiencing a Mental Health or Behavioral Health Crisis or need someone to talk to, please call 911  Patient verbalizes understanding of instructions and care plan provided today and agrees to view in MyChart. Active MyChart status and patient understanding of how to access instructions and care plan via MyChart confirmed with patient.     No further follow up required: Patient does not request a follow up visit.  Lysle Morales, BSW Social Worker [phone]

## 2023-03-03 ENCOUNTER — Emergency Department (HOSPITAL_COMMUNITY)
Admission: EM | Admit: 2023-03-03 | Discharge: 2023-03-04 | Disposition: A | Discharge status: Home / Self Care | Payer: Self-pay | Attending: Emergency Medicine | Admitting: Emergency Medicine

## 2023-03-03 ENCOUNTER — Telehealth: Payer: Self-pay | Admitting: *Deleted

## 2023-03-03 ENCOUNTER — Other Ambulatory Visit: Payer: Self-pay

## 2023-03-03 ENCOUNTER — Encounter (HOSPITAL_COMMUNITY): Payer: Self-pay

## 2023-03-03 DIAGNOSIS — R739 Hyperglycemia, unspecified: Secondary | ICD-10-CM

## 2023-03-03 DIAGNOSIS — R110 Nausea: Secondary | ICD-10-CM | POA: Insufficient documentation

## 2023-03-03 DIAGNOSIS — R519 Headache, unspecified: Secondary | ICD-10-CM | POA: Insufficient documentation

## 2023-03-03 DIAGNOSIS — Z794 Long term (current) use of insulin: Secondary | ICD-10-CM | POA: Insufficient documentation

## 2023-03-03 DIAGNOSIS — Z1211 Encounter for screening for malignant neoplasm of colon: Secondary | ICD-10-CM

## 2023-03-03 DIAGNOSIS — Z7984 Long term (current) use of oral hypoglycemic drugs: Secondary | ICD-10-CM | POA: Insufficient documentation

## 2023-03-03 DIAGNOSIS — R1084 Generalized abdominal pain: Secondary | ICD-10-CM | POA: Insufficient documentation

## 2023-03-03 DIAGNOSIS — E1165 Type 2 diabetes mellitus with hyperglycemia: Secondary | ICD-10-CM | POA: Insufficient documentation

## 2023-03-03 LAB — URINALYSIS, ROUTINE W REFLEX MICROSCOPIC
Bacteria, UA: NONE SEEN
Bilirubin Urine: NEGATIVE
Glucose, UA: >=500 mg/dL — AB
Ketones, ur: 5 mg/dL — AB
Nitrite: NEGATIVE
Protein, ur: NEGATIVE mg/dL
Specific Gravity, Urine: 1.028 (ref 1.005–1.030)
pH: 6 (ref 5.0–8.0)

## 2023-03-03 LAB — I-STAT CHEM 8, ED
BUN: 8 mg/dL (ref 6–20)
Calcium, Ion: 1.25 mmol/L (ref 1.15–1.40)
Chloride: 100 mmol/L (ref 98–111)
Creatinine, Ser: 0.4 mg/dL — ABNORMAL LOW (ref 0.44–1.00)
Glucose, Bld: 505 mg/dL (ref 70–99)
HCT: 46 % (ref 36.0–46.0)
Hemoglobin: 15.6 g/dL — ABNORMAL HIGH (ref 12.0–15.0)
Potassium: 4.3 mmol/L (ref 3.5–5.1)
Sodium: 134 mmol/L — ABNORMAL LOW (ref 135–145)
TCO2: 23 mmol/L (ref 22–32)

## 2023-03-03 LAB — CBC
HCT: 43.2 % (ref 36.0–46.0)
Hemoglobin: 13.9 g/dL (ref 12.0–15.0)
MCH: 26.5 pg (ref 26.0–34.0)
MCHC: 32.2 g/dL (ref 30.0–36.0)
MCV: 82.4 fL (ref 80.0–100.0)
Platelets: 190 10*3/uL (ref 150–400)
RBC: 5.24 MIL/uL — ABNORMAL HIGH (ref 3.87–5.11)
RDW: 13.9 % (ref 11.5–15.5)
WBC: 10.1 10*3/uL (ref 4.0–10.5)
nRBC: 0 % (ref 0.0–0.2)

## 2023-03-03 LAB — CBG MONITORING, ED: Glucose-Capillary: 473 mg/dL — ABNORMAL HIGH (ref 70–99)

## 2023-03-03 NOTE — ED Notes (Signed)
RN notified of I-stat chem results.

## 2023-03-03 NOTE — ED Notes (Signed)
Light green top sent to lab

## 2023-03-03 NOTE — Telephone Encounter (Signed)
Patient was called to let know that pre visit appointment for today and colonoscopy were cancelled because the patient is difficult intubation and must be done at the hospital per Cathlyn Parsons notes.  Once Dr. Lanetta Inch nurse schedules the hospital procedure patient will be rescheduled for PV

## 2023-03-03 NOTE — Telephone Encounter (Signed)
Yesi,   This pt is a documented difficult intubation and her procedure will need to be done at the hospital.   Thanks,  Cathlyn Parsons

## 2023-03-03 NOTE — ED Triage Notes (Signed)
Pt arrived POV for concerns for hyperglycemia >500 at home, pt took 14 units insulin and 2,000 mg metformin. Pt reports nausea, abd pain, and increase urination. A&O x4, NAD noted, VSS.    CBG 473 at current.

## 2023-03-03 NOTE — Telephone Encounter (Signed)
Called and spoke with patient in spanish. Pt has been scheduled for hospital colonoscopy with Dr. Adela Lank on 05/25/23 at 8:30 am, arriving at 7 am with a care partner. PV has been rescheduled to 05/11/23 at 11 am. Pt is aware that I will mail her updated appt information to her address on file. Pt verbalized understanding and had no concerns at the end of the call.

## 2023-03-04 ENCOUNTER — Telehealth (HOSPITAL_BASED_OUTPATIENT_CLINIC_OR_DEPARTMENT_OTHER): Payer: Self-pay | Admitting: Family Medicine

## 2023-03-04 ENCOUNTER — Other Ambulatory Visit: Payer: Self-pay

## 2023-03-04 ENCOUNTER — Emergency Department (HOSPITAL_COMMUNITY): Payer: Self-pay

## 2023-03-04 ENCOUNTER — Other Ambulatory Visit (HOSPITAL_BASED_OUTPATIENT_CLINIC_OR_DEPARTMENT_OTHER): Payer: Self-pay | Admitting: Family Medicine

## 2023-03-04 ENCOUNTER — Other Ambulatory Visit (HOSPITAL_BASED_OUTPATIENT_CLINIC_OR_DEPARTMENT_OTHER): Payer: Self-pay | Admitting: *Deleted

## 2023-03-04 ENCOUNTER — Other Ambulatory Visit (HOSPITAL_BASED_OUTPATIENT_CLINIC_OR_DEPARTMENT_OTHER): Payer: Self-pay

## 2023-03-04 DIAGNOSIS — E1169 Type 2 diabetes mellitus with other specified complication: Secondary | ICD-10-CM

## 2023-03-04 DIAGNOSIS — E1165 Type 2 diabetes mellitus with hyperglycemia: Secondary | ICD-10-CM

## 2023-03-04 DIAGNOSIS — K760 Fatty (change of) liver, not elsewhere classified: Secondary | ICD-10-CM

## 2023-03-04 DIAGNOSIS — E1159 Type 2 diabetes mellitus with other circulatory complications: Secondary | ICD-10-CM

## 2023-03-04 LAB — COMPREHENSIVE METABOLIC PANEL
ALT: 40 U/L (ref 0–44)
AST: 37 U/L (ref 15–41)
Albumin: 3.9 g/dL (ref 3.5–5.0)
Alkaline Phosphatase: 145 U/L — ABNORMAL HIGH (ref 38–126)
Anion gap: 10 (ref 5–15)
BUN: 10 mg/dL (ref 6–20)
CO2: 19 mmol/L — ABNORMAL LOW (ref 22–32)
Calcium: 9.2 mg/dL (ref 8.9–10.3)
Chloride: 99 mmol/L (ref 98–111)
Creatinine, Ser: 0.5 mg/dL (ref 0.44–1.00)
GFR, Estimated: >60 mL/min (ref >60)
Glucose, Bld: 430 mg/dL — ABNORMAL HIGH (ref 70–99)
Potassium: 4 mmol/L (ref 3.5–5.1)
Sodium: 128 mmol/L — ABNORMAL LOW (ref 135–145)
Total Bilirubin: 1 mg/dL (ref 0.3–1.2)
Total Protein: 7.5 g/dL (ref 6.5–8.1)

## 2023-03-04 LAB — CBG MONITORING, ED: Glucose-Capillary: 374 mg/dL — ABNORMAL HIGH (ref 70–99)

## 2023-03-04 LAB — BRAIN NATRIURETIC PEPTIDE: B Natriuretic Peptide: 31.7 pg/mL (ref 0.0–100.0)

## 2023-03-04 MED ORDER — INSULIN ASPART 100 UNIT/ML IJ SOLN
6.0000 [IU] | Freq: Once | INTRAMUSCULAR | Status: AC
  Administered 2023-03-04: 6 [IU] via SUBCUTANEOUS
  Filled 2023-03-04: qty 0.06

## 2023-03-04 MED ORDER — KETOROLAC TROMETHAMINE 15 MG/ML IJ SOLN
15.0000 mg | Freq: Once | INTRAMUSCULAR | Status: AC
  Administered 2023-03-04: 15 mg via INTRAVENOUS
  Filled 2023-03-04: qty 1

## 2023-03-04 MED ORDER — METOCLOPRAMIDE HCL 5 MG/ML IJ SOLN
10.0000 mg | Freq: Once | INTRAMUSCULAR | Status: AC
  Administered 2023-03-04: 10 mg via INTRAVENOUS
  Filled 2023-03-04: qty 2

## 2023-03-04 MED ORDER — ATORVASTATIN CALCIUM 20 MG PO TABS
20.0000 mg | ORAL_TABLET | Freq: Every day | ORAL | 3 refills | Status: DC
  Filled 2023-03-04 (×3): qty 90, 90d supply, fill #0

## 2023-03-04 MED ORDER — LISINOPRIL 10 MG PO TABS
10.0000 mg | ORAL_TABLET | Freq: Every day | ORAL | 3 refills | Status: DC
  Filled 2023-03-04 (×2): qty 90, 90d supply, fill #0

## 2023-03-04 MED ORDER — 1ST TIER UNIFINE PENTIPS 32G X 4 MM MISC
12 refills | Status: DC
  Filled 2023-03-04 (×2): qty 100, 90d supply, fill #0

## 2023-03-04 MED ORDER — SODIUM CHLORIDE 0.9 % IV BOLUS
1000.0000 mL | Freq: Once | INTRAVENOUS | Status: AC
  Administered 2023-03-04: 1000 mL via INTRAVENOUS

## 2023-03-04 NOTE — ED Notes (Signed)
CBG 374

## 2023-03-04 NOTE — ED Provider Notes (Signed)
[place] EMERGENCY DEPARTMENT AT [facility] Provider Note   CSN: [id number] Arrival date & time: 03/03/23  2210     History  Chief Complaint  Patient presents with   Hyperglycemia    Alexandra Norris is a 47 y.o. female.   Hyperglycemia Alexandra Norris is a 47 y.o. female who presents to the Emergency Department complaining of hyperglycemia.  She presents to the emergency department for elevated blood sugars for the last 2 to 3 weeks.  She states her sugars have been running between 405 100.  Over the last week her symptoms of worsened.  She has been compliant with her home insulin.  She is taking metformin 1000 mg twice daily as well as 10 units of Novolin in the morning and 4 units in the evening.  She did have this medication change 2 weeks ago.  She reports feeling dizzy with a dry mouth, excessive thirst.  She also reports some generalized abdominal discomfort that is described as a nausea as well as headache.  She has had emesis in the mornings as well as some shortness of breath this week.  Overall she feels like she is swelling.  No chest pain, fevers, diarrhea.  No dysuria.  Home Medications Prior to Admission medications   Medication Sig Start Date End Date Taking? Authorizing Provider  albuterol (VENTOLIN HFA) 108 (90 Base) MCG/ACT inhaler Inhale 2 puffs into the lungs every 6 (six) hours as needed for wheezing or shortness of breath. 10/05/22   [place], Buren Kos, MD  atorvastatin (LIPITOR) 20 MG tablet Take 1 tablet (20 mg total) by mouth at bedtime. 09/15/21   Tollie Eth, NP  fluconazole (DIFLUCAN) 150 MG tablet Take 1 tablet today. Within 72 hours, take the second tablet. 02/16/23   Alyson Reedy, FNP  ibuprofen (ADVIL) 800 MG tablet Take 1 tablet (800 mg total) by mouth every 8 (eight) hours as needed. 01/16/23     insulin NPH Human (NOVOLIN N) 100 UNIT/ML injection Inject 0.1 mLs (10 Units total) into the skin daily before  breakfast AND 0.04 mLs (4 Units total) at bedtime. 02/22/23   Alyson Reedy, FNP  lisinopril (ZESTRIL) 10 MG tablet Take 1 tablet (10 mg total) by mouth daily. 09/15/21   Tollie Eth, NP  metFORMIN (GLUCOPHAGE) 500 MG tablet Take 2 tablets (1,000 mg total) by mouth daily with breakfast AND 2 tablets (1,000 mg total) every evening. 02/16/23   Alyson Reedy, FNP  Multiple Vitamin (MULTIVITAMIN WITH MINERALS) TABS tablet Take 1 tablet by mouth daily in the afternoon.    [provider]  nystatin (MYCOSTATIN/NYSTOP) powder Apply 1 Application topically 3 (three) times daily. 02/16/23   Alyson Reedy, FNP      Allergies    Patient has no known allergies.    Review of Systems   Review of Systems  All other systems reviewed and are negative.   Physical Exam Updated Vital Signs BP 125/64   Pulse 81   Temp 99.3 F (37.4 C) (Oral)   Resp 19   Ht 5\' 1"  (1.549 m)   Wt 101.2 kg   LMP 09/20/2019 (Exact Date)   SpO2 96%   BMI 42.14 kg/m  Physical Exam Vitals and nursing note reviewed.  Constitutional:      Appearance: She is well-developed.  HENT:     Head: Normocephalic and atraumatic.  Cardiovascular:     Rate and Rhythm: Normal rate and regular rhythm.  Pulmonary:     Effort:  Pulmonary effort is normal. No respiratory distress.  Abdominal:     Palpations: Abdomen is soft.     Tenderness: There is no abdominal tenderness. There is no guarding or rebound.  Musculoskeletal:        General: No swelling or tenderness.  Skin:    General: Skin is warm and dry.  Neurological:     Mental Status: She is alert and oriented to person, place, and time.  Psychiatric:        Behavior: Behavior normal.     ED Results / Procedures / Treatments   Labs (all labs ordered are listed, but only abnormal results are displayed) Labs Reviewed  CBC - Abnormal; Notable for the following components:      Result Value   RBC 5.24 (*)    All other components within normal limits   URINALYSIS, ROUTINE W REFLEX MICROSCOPIC - Abnormal; Notable for the following components:   Color, Urine STRAW (*)    Glucose, UA >=500 (*)    Hgb urine dipstick SMALL (*)    Ketones, ur 5 (*)    Leukocytes,Ua TRACE (*)    All other components within normal limits  COMPREHENSIVE METABOLIC PANEL - Abnormal; Notable for the following components:   Sodium 128 (*)    CO2 19 (*)    Glucose, Bld 430 (*)    Alkaline Phosphatase 145 (*)    All other components within normal limits  CBG MONITORING, ED - Abnormal; Notable for the following components:   Glucose-Capillary 473 (*)    All other components within normal limits  I-STAT CHEM 8, ED - Abnormal; Notable for the following components:   Sodium 134 (*)    Creatinine, Ser 0.40 (*)    Glucose, Bld 505 (*)    Hemoglobin 15.6 (*)    All other components within normal limits  BRAIN NATRIURETIC PEPTIDE  CBG MONITORING, ED  CBG MONITORING, ED    EKG EKG Interpretation Date/Time:  Thursday March 04 2023 01:28:59 EDT Ventricular Rate:  86 PR Interval:  148 QRS Duration:  88 QT Interval:  383 QTC Calculation: 459 R Axis:   67  Text Interpretation: Sinus rhythm Confirmed by Tilden Fossa [id number]) on 03/04/2023 1:37:08 AM  Radiology CT Head Wo Contrast  Result Date: 03/04/2023 CLINICAL DATA:  Headache. EXAM: CT HEAD WITHOUT CONTRAST TECHNIQUE: Contiguous axial images were obtained from the base of the skull through the vertex without intravenous contrast. RADIATION DOSE REDUCTION: This exam was performed according to the departmental dose-optimization program which includes automated exposure control, adjustment of the mA and/or kV according to patient size and/or use of iterative reconstruction technique. COMPARISON:  July 28, 2022 FINDINGS: Brain: No evidence of acute infarction, hemorrhage, hydrocephalus, extra-axial collection or mass lesion/mass effect. Vascular: No hyperdense vessel or unexpected calcification. Skull:  Normal. Negative for fracture or focal lesion. Sinuses/Orbits: No acute finding. Other: None. IMPRESSION: Normal head CT. Electronically Signed   By: Aram Candela M.D.   On: 03/04/2023 02:02   DG Chest 2 View  Result Date: 03/04/2023 CLINICAL DATA:  Hyperglycemia and shortness of breath. EXAM: CHEST - 2 VIEW COMPARISON:  June 17, 2022 FINDINGS: The heart size and mediastinal contours are within normal limits. Mild atelectasis seen within the bilateral lung bases, right greater than left. No pleural effusion or pneumothorax is identified. Radiopaque surgical clips are seen within the right upper quadrant. The visualized skeletal structures are unremarkable. IMPRESSION: Mild bibasilar atelectasis, right greater than left. Electronically Signed   By: Waylan Rocher  [place] M.D.   On: 03/04/2023 02:00    Procedures Procedures    Medications Ordered in ED Medications  sodium chloride 0.9 % bolus 1,000 mL (1,000 mLs Intravenous New Bag/Given 03/04/23 0114)  metoCLOPramide (REGLAN) injection 10 mg (10 mg Intravenous Given 03/04/23 0115)    ED Course/ Medical Decision Making/ A&P                                 Medical Decision Making Amount and/or Complexity of Data Reviewed Labs: ordered. Radiology: ordered.  Risk Prescription drug management.   Patient with history of type 2 diabetes here for evaluation of elevated blood sugars for 2 weeks despite taking her home insulin and metformin.  Labs significant for hyperglycemia, pseudohyponatremia.  Bicarb is borderline low but she does not have an elevated anion gap.  UA is not consistent with UTI.  Current clinical picture is not consistent with DKA.  No evidence of serious bacterial infection based off of history and examination.  No evidence of acute pneumonia or CHF.  Chest x-ray personally reviewed and interpreted, agree with radiologist interpretation.  Current picture is not consistent with PE, acute abdomen.  Discussed with patient home  care for hyperglycemia, suspect she will need to increase insulin.  Will adjust her insulin with close PCP follow-up and return precautions.  Patient also with complaint of headaches for 3 weeks.  CT head is without acute abnormality.  Her headache is improved with treatment in the emergency department.  She is nontoxic with no focal neurologic deficits.  Current picture is not consistent with CVA, meningitis, dural sinus thrombosis.        Final Clinical Impression(s) / ED Diagnoses Final diagnoses:  None    Rx / DC Orders ED Discharge Orders     None         Tilden Fossa, MD 03/04/23 [phone]

## 2023-03-04 NOTE — Telephone Encounter (Signed)
Received Alexandra Norris Flextouch injections from patient assistance program. Called patient via Spanish interpreter to let her know that we had received the injections and that we were placing them in our fridge at the office for her to come by to pick up at her convenience. Patient verbalized understanding and stated that she would come by today to pick the injections up.

## 2023-03-04 NOTE — Discharge Instructions (Addendum)
Take 12 units of insulin in the morning and 14 units of insulin in the evening.

## 2023-03-04 NOTE — Telephone Encounter (Signed)
Patient came to the office 9/26 to pick up the [place] Flextouch. Rx for needles was sent to pharmacy for pt so she could have to be able to use the medication.  Showed patient that she is to do 20units when she uses the pen. Also spoke with spouse and made him aware that patient is to be doing 20units.  Patient and spouse wanted to know since patient was approved assistance for the [place], is she still to do the other insulin that she is on or is she to just do the [place].  Patient and spouse aware that Foye Clock is not in the office 9/26 but will be back 9/27. Routing this encounter to both [place] for follow up.

## 2023-03-05 NOTE — Telephone Encounter (Signed)
Spoke with patients husband, advised her to D/C Novolin insulin per PCP and continue [place]. Will f/u on 10/14 appointment

## 2023-03-09 ENCOUNTER — Encounter (HOSPITAL_BASED_OUTPATIENT_CLINIC_OR_DEPARTMENT_OTHER): Payer: Self-pay | Admitting: Family Medicine

## 2023-03-09 ENCOUNTER — Encounter (HOSPITAL_BASED_OUTPATIENT_CLINIC_OR_DEPARTMENT_OTHER): Payer: Self-pay

## 2023-03-09 ENCOUNTER — Ambulatory Visit (INDEPENDENT_AMBULATORY_CARE_PROVIDER_SITE_OTHER): Payer: Self-pay | Admitting: Family Medicine

## 2023-03-09 VITALS — BP 134/69 | HR 91 | Ht 61.0 in | Wt 231.0 lb

## 2023-03-09 DIAGNOSIS — R0602 Shortness of breath: Secondary | ICD-10-CM | POA: Insufficient documentation

## 2023-03-09 LAB — HM DIABETES EYE EXAM

## 2023-03-09 NOTE — Assessment & Plan Note (Addendum)
Patient is a 47 year old female patient who presents today for shortness of breath with cough since she had COVID about 5 weeks ago. Vital signs reviewed and stable. Patient mentioned symptoms in ED on 03/04/2023 with no acute findings. Normal BNP, no lower extremity edema or adventitious lung sounds present- no evidence of CHF. EKG completed in ED with NSR at 86. Cardiovascular exam with heart regular rate and rhythm. Normal heart sounds, no murmurs present. Lung sounds clear to auscultation in all lung fields- no evidence of pneumonia and not consistent with PE. CBC normal- no acute anemia. Review of CXR, agreed with radiologist's review, mild bibasilar atelectasis. Feel this may be related to deconditioning or related to decreased lung function due to recent COVID infection. Discussed plan of care and patient is agreeable. Will obtain PFTs and treat accordingly. Reviewed ED precautions with patient and when to seek emergency care.

## 2023-03-09 NOTE — Progress Notes (Signed)
Established Patient Office Visit  Subjective   Patient ID: Alexandra Norris, female    DOB: 1975-12-21  Age: 47 y.o. MRN: [id number]  Chief Complaint  Patient presents with   Diabetes    Blood sugar fasting this AM 278   Shortness of Breath    Had covid 5 weeks ago pain SOB started after covid. Feels like something is sitting on her chest, has SOB with exertion   Alexandra Norris is a 47 year old female patient who presents today for shortness of breath with activity and rest. She reports that she was diagnosed with covid about 5 weeks ago, and has had this short of breath feeling since, along with dry cough. Reports chest pressure that is present when she is feeling shob- reports that it does not last long. She reports frequent headaches and changes to her vision (her eyes "feel hot"). Denies fever, abdominal pain, N/V, palpitations, lower extremity edema, and orthopnea.  Reports she has not used her albuterol recently.   Alexandra Norris 20units once daily, taking it in the morning not night- advised her to take in evening Fasting today was 278 mg/dL  Lab Results  Component Value Date   HGBA1C 12.4 (H) 02/04/2023    04/22/2021 Lab Results  Component Value Date   LABMICR 6.2 08/07/2020   MICROALBUR 10 04/22/2021   Wt Readings from Last 3 Encounters:  03/09/23 231 lb (104.8 kg)  03/03/23 223 lb (101.2 kg)  02/22/23 227 lb 14.4 oz (103.4 kg)    Review of Systems  Constitutional:  Negative for chills, fever, malaise/fatigue and weight loss.  Eyes:  Negative for blurred vision and double vision.  Respiratory:  Positive for cough and shortness of breath. Negative for wheezing.   Cardiovascular:  Positive for chest pain. Negative for palpitations, orthopnea, claudication, leg swelling and PND.  Gastrointestinal:  Negative for abdominal pain, nausea and vomiting.  Musculoskeletal:  Negative for myalgias.  Neurological:  Positive for headaches. Negative for  dizziness and weakness.  Psychiatric/Behavioral:  Negative for depression and suicidal ideas. The patient is not nervous/anxious and does not have insomnia.      Objective:    BP 134/69   Pulse 91   Ht 5\' 1"  (1.549 m)   Wt 231 lb (104.8 kg)   LMP 09/20/2019 (Exact Date)   SpO2 97%   BMI 43.65 kg/m  BP Readings from Last 3 Encounters:  03/09/23 134/69  03/04/23 (!) 108/55  02/22/23 (!) 105/57     Physical Exam Vitals reviewed.  Constitutional:      Appearance: Normal appearance. She is well-developed.  Cardiovascular:     Rate and Rhythm: Normal rate and regular rhythm.     Pulses: Normal pulses.     Heart sounds: Normal heart sounds.  Pulmonary:     Effort: Pulmonary effort is normal.     Breath sounds: Normal breath sounds. No decreased breath sounds.  Musculoskeletal:     Right lower leg: No edema.     Left lower leg: No edema.  Neurological:     Mental Status: She is alert.  Psychiatric:        Mood and Affect: Mood normal.        Behavior: Behavior normal.     Assessment & Plan:  Shortness of breath Assessment & Plan: Patient is a 47 year old female patient who presents today for shortness of breath with cough since she had COVID about 5 weeks ago. Vital signs reviewed and stable. Patient mentioned symptoms in ED  on 03/04/2023 with no acute findings. Normal BNP, no lower extremity edema or adventitious lung sounds present- no evidence of CHF. EKG completed in ED with NSR at 86. Cardiovascular exam with heart regular rate and rhythm. Normal heart sounds, no murmurs present. Lung sounds clear to auscultation in all lung fields- no evidence of pneumonia and not consistent with PE. CBC normal- no acute anemia. Review of CXR, agreed with radiologist's review, mild bibasilar atelectasis. Feel this may be related to deconditioning or related to decreased lung function due to recent COVID infection. Discussed plan of care and patient is agreeable. Will obtain PFTs and treat  accordingly. Reviewed ED precautions with patient and when to seek emergency care.   Orders: -     Pulmonary Function Test; Future   Return in about 8 weeks (around 05/07/2023) for Diabetes f/u.    Alyson Reedy, FNP

## 2023-03-11 ENCOUNTER — Ambulatory Visit (INDEPENDENT_AMBULATORY_CARE_PROVIDER_SITE_OTHER): Payer: Self-pay | Admitting: Orthopaedic Surgery

## 2023-03-11 ENCOUNTER — Encounter: Payer: Self-pay | Admitting: Orthopaedic Surgery

## 2023-03-11 DIAGNOSIS — G5622 Lesion of ulnar nerve, left upper limb: Secondary | ICD-10-CM

## 2023-03-11 NOTE — Progress Notes (Signed)
Office Visit Note   Patient: Alexandra Norris           Date of Birth: [birth year]           MRN: [id number] Visit Date: 03/11/2023              Requested by: Alexandra Reedy, FNP [address] [place],  [place] [address] PCP: Alexandra Reedy, FNP   Assessment & Plan: Visit Diagnoses:  1. Cubital tunnel syndrome on left     Plan: Patient is a 47 year old female with a left cubital tunnel syndrome.  She had a prior right cubital tunnel release with good relief.  She has constant numbness and tingling in the ulnar nerve distribution in the hand.  Given her options she would like to move forward with scheduling a cubital tunnel release.  Alexandra Norris will call the patient to confirm surgery time.  Follow-Up Instructions: No follow-ups on file.   Orders:  No orders of the defined types were placed in this encounter.  No orders of the defined types were placed in this encounter.     Procedures: No procedures performed   Clinical Data: No additional findings.   Subjective: Chief Complaint  Patient presents with   Right Hand - Pain, Numbness   Left Hand - Numbness, Pain    HPI Alexandra Norris is a 47 year old female who comes in for evaluation of constant numbness to the left ring and small fingers of the left hand.  She has had a right cubital tunnel release in the past.  She feels constant numbness. Review of Systems  Constitutional: Negative.   HENT: Negative.    Eyes: Negative.   Respiratory: Negative.    Cardiovascular: Negative.   Endocrine: Negative.   Musculoskeletal: Negative.   Neurological: Negative.   Hematological: Negative.   Psychiatric/Behavioral: Negative.    All other systems reviewed and are negative.    Objective: Vital Signs: LMP 09/20/2019 (Exact Date)   Physical Exam Vitals and nursing note reviewed.  Constitutional:      Appearance: She is well-developed.  HENT:     Head: Normocephalic and atraumatic.  Pulmonary:      Effort: Pulmonary effort is normal.  Abdominal:     Palpations: Abdomen is soft.  Musculoskeletal:     Cervical back: Neck supple.  Skin:    General: Skin is warm.     Capillary Refill: Capillary refill takes less than 2 seconds.  Neurological:     Mental Status: She is alert and oriented to person, place, and time.  Psychiatric:        Behavior: Behavior normal.        Thought Content: Thought content normal.        Judgment: Judgment normal.     Ortho Exam Exam of the left elbow shows positive Tinel at the cubital tunnel.  Full elbow range of motion.  Ulnar nerve is stable. Specialty Comments:  No specialty comments available.  Imaging: No results found.   PMFS History: Patient Active Problem List   Diagnosis Date Noted   Shortness of breath 03/09/2023   Rash 02/16/2023   Uncontrolled type 2 diabetes mellitus with hyperglycemia (HCC) 07/28/2022   Asthma due to seasonal allergies 09/25/2021   Arthritis of right acromioclavicular joint 04/24/2021   Chronic left shoulder pain 04/22/2021   Hypertension associated with diabetes (HCC) 04/22/2021   Hyperlipidemia associated with type 2 diabetes mellitus (HCC) 04/22/2021   Fatty liver disease, nonalcoholic 09/09/2020   Arthritis  Vitamin D deficiency 09/25/2015   Anxiety and depression 07/06/2014   GASTROESOPHAGEAL REFLUX DISEASE 08/14/2009   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 level with radiculopathy 04/03/2020   Cervical post-laminectomy syndrome 03/06/2022   Cubital tunnel syndrome on right 12/02/2021   Depression    DEPRESSION, SITUATIONAL, PROLONGED 02/05/2009   Qualifier: Diagnosis of  By: Wallene Huh  MD, Khary     Diabetes mellitus without complication (HCC)    Dizziness 04/22/2021   Dyspnea    Elevated blood-pressure reading, without diagnosis of  hypertension 06/17/2020   Endometriosis    Fatigue 04/22/2021   History of blood transfusion 1999   w/vaginal delivery   History of bronchitis    "I've stayed in hospital 2 X w/bronchitis"   Hyperlipidemia    Hypertension    Impingement syndrome of left shoulder    Influenza vaccine needed 04/03/2020   Left carpal tunnel syndrome 07/11/2019   Migraine    Moderate persistent asthma with acute exacerbation 12/06/2006   Qualifier: Diagnosis of  By: Wallene Huh  MD, Khary     Nasal obstruction 02/18/2016   Formatting of this note might be different from the original.  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   Pre-diabetes 10/03/2019   A1C 6.4   Right carpal tunnel syndrome 07/11/2019   S/P arthroscopy of right shoulder 07/08/2021   S/P carpal tunnel release 08/30/2019   Seasonal allergies 10/08/2016   Shingles 11/14/2020   Sinusitis, bacterial 10/07/2021   Snoring 01/20/2022    Family History  Problem Relation Age of Onset   Diabetes Mother    Osteoarthritis Mother    Hypertension Mother    Hyperlipidemia Mother    Arthritis Mother    Colon cancer Mother     Past Surgical History:  Procedure Laterality Date   ABDOMINAL HYSTERECTOMY  10/17/2019   APPENDECTOMY     CARPAL TUNNEL RELEASE Right 07/19/2019   Procedure: RIGHT CARPAL TUNNEL RELEASE;  Surgeon: Tarry Kos, MD;  Location: [facility];  Service: Orthopedics;  Laterality: Right;   CARPAL TUNNEL RELEASE Left 09/13/2019   Procedure: LEFT CARPAL TUNNEL RELEASE;  Surgeon: Tarry Kos, MD;  Location: [facility];  Service: Orthopedics;  Laterality: Left;  Bier block   CESAREAN SECTION  1997; 2008   CHOLECYSTECTOMY  2011   SHOULDER ARTHROSCOPY WITH DISTAL CLAVICLE RESECTION  02/12/2021   Procedure: SHOULDER ARTHROSCOPY WITH DISTAL CLAVICLE RESECTION;  Surgeon: Tarry Kos, MD;  Location: [facility];  Service: Orthopedics;;   SHOULDER ARTHROSCOPY WITH ROTATOR CUFF REPAIR AND SUBACROMIAL DECOMPRESSION Left 02/12/2021   Procedure: LEFT SHOULDER ARTHROSCOPY WITH ROTATOR CUFF REPAIR, SUBACROMIAL DECOMPRESSION, EXTENSIVE DEBRIDEMENT;  Surgeon: Tarry Kos, MD;  Location: [facility];  Service: Orthopedics;  Laterality: Left;   TONSILLECTOMY     tonsilletomy     TUBAL LIGATION  04/2007   ULNAR TUNNEL RELEASE Right 12/17/2021   Procedure: RIGHT CUBITAL TUNNEL RELEASE;  Surgeon: Tarry Kos, MD;  Location: [facility];  Service: Orthopedics;  Laterality: Right;   VAGINAL HYSTERECTOMY Bilateral 10/17/2019   Procedure: HYSTERECTOMY VAGINAL WITH  SALPINGECTOMY;  Surgeon: Hermina Staggers, MD;  Location: [facility];  Service: Gynecology;  Laterality: Bilateral;   Social History   Occupational History   Not on file  Tobacco Use   Smoking status: Never   Smokeless tobacco: Never  Vaping Use   Vaping status: Never Used  Substance and Sexual Activity   Alcohol use: No   Drug use: No   Sexual activity: Yes    Birth control/protection: Surgical

## 2023-03-12 ENCOUNTER — Encounter: Payer: Self-pay | Admitting: Physician Assistant

## 2023-03-12 ENCOUNTER — Other Ambulatory Visit (INDEPENDENT_AMBULATORY_CARE_PROVIDER_SITE_OTHER): Payer: Self-pay

## 2023-03-12 ENCOUNTER — Ambulatory Visit (INDEPENDENT_AMBULATORY_CARE_PROVIDER_SITE_OTHER): Payer: Self-pay | Admitting: Physician Assistant

## 2023-03-12 DIAGNOSIS — M542 Cervicalgia: Secondary | ICD-10-CM | POA: Insufficient documentation
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
## 2023-03-15 ENCOUNTER — Encounter (HOSPITAL_BASED_OUTPATIENT_CLINIC_OR_DEPARTMENT_OTHER): Payer: Self-pay | Admitting: *Deleted

## 2023-03-15 ENCOUNTER — Other Ambulatory Visit: Payer: Self-pay
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
## 2023-03-18 ENCOUNTER — Ambulatory Visit: Payer: Self-pay

## 2023-03-22 ENCOUNTER — Ambulatory Visit (HOSPITAL_BASED_OUTPATIENT_CLINIC_OR_DEPARTMENT_OTHER): Payer: Self-pay | Admitting: Family Medicine

## 2023-03-23 ENCOUNTER — Encounter: Payer: Self-pay | Admitting: Gastroenterology

## 2023-03-24 ENCOUNTER — Other Ambulatory Visit: Payer: Self-pay | Admitting: Family Medicine

## 2023-03-24 DIAGNOSIS — Z1231 Encounter for screening mammogram for malignant neoplasm of breast: Secondary | ICD-10-CM

## 2023-03-29 ENCOUNTER — Other Ambulatory Visit: Payer: Self-pay

## 2023-03-29 ENCOUNTER — Emergency Department (HOSPITAL_COMMUNITY): Payer: Self-pay

## 2023-03-29 ENCOUNTER — Emergency Department (HOSPITAL_COMMUNITY)
Admission: EM | Admit: 2023-03-29 | Discharge: 2023-03-29 | Disposition: A | Discharge status: Home / Self Care | Payer: Self-pay | Attending: Emergency Medicine | Admitting: Emergency Medicine

## 2023-03-29 ENCOUNTER — Encounter (HOSPITAL_COMMUNITY): Payer: Self-pay

## 2023-03-29 DIAGNOSIS — E119 Type 2 diabetes mellitus without complications: Secondary | ICD-10-CM | POA: Insufficient documentation

## 2023-03-29 DIAGNOSIS — R519 Headache, unspecified: Secondary | ICD-10-CM

## 2023-03-29 DIAGNOSIS — Z794 Long term (current) use of insulin: Secondary | ICD-10-CM | POA: Insufficient documentation

## 2023-03-29 DIAGNOSIS — Z79899 Other long term (current) drug therapy: Secondary | ICD-10-CM | POA: Insufficient documentation

## 2023-03-29 DIAGNOSIS — I10 Essential (primary) hypertension: Secondary | ICD-10-CM | POA: Insufficient documentation

## 2023-03-29 DIAGNOSIS — Z7984 Long term (current) use of oral hypoglycemic drugs: Secondary | ICD-10-CM | POA: Insufficient documentation

## 2023-03-29 DIAGNOSIS — R202 Paresthesia of skin: Secondary | ICD-10-CM

## 2023-03-29 LAB — HEPATIC FUNCTION PANEL
ALT: 43 U/L (ref 0–44)
AST: 33 U/L (ref 15–41)
Albumin: 3.3 g/dL — ABNORMAL LOW (ref 3.5–5.0)
Alkaline Phosphatase: 130 U/L — ABNORMAL HIGH (ref 38–126)
Bilirubin, Direct: 0.1 mg/dL (ref 0.0–0.2)
Indirect Bilirubin: 0.7 mg/dL (ref 0.3–0.9)
Total Bilirubin: 0.8 mg/dL (ref 0.3–1.2)
Total Protein: 6.9 g/dL (ref 6.5–8.1)

## 2023-03-29 LAB — CBC WITH DIFFERENTIAL/PLATELET
Abs Immature Granulocytes: 0.03 10*3/uL (ref 0.00–0.07)
Basophils Absolute: 0.1 10*3/uL (ref 0.0–0.1)
Basophils Relative: 1 %
Eosinophils Absolute: 0.3 10*3/uL (ref 0.0–0.5)
Eosinophils Relative: 3 %
HCT: 44.4 % (ref 36.0–46.0)
Hemoglobin: 14.2 g/dL (ref 12.0–15.0)
Immature Granulocytes: 0 %
Lymphocytes Relative: 30 %
Lymphs Abs: 2.8 10*3/uL (ref 0.7–4.0)
MCH: 26.6 pg (ref 26.0–34.0)
MCHC: 32 g/dL (ref 30.0–36.0)
MCV: 83.1 fL (ref 80.0–100.0)
Monocytes Absolute: 0.6 10*3/uL (ref 0.1–1.0)
Monocytes Relative: 7 %
Neutro Abs: 5.7 10*3/uL (ref 1.7–7.7)
Neutrophils Relative %: 59 %
Platelets: 221 10*3/uL (ref 150–400)
RBC: 5.34 MIL/uL — ABNORMAL HIGH (ref 3.87–5.11)
RDW: 13.9 % (ref 11.5–15.5)
WBC: 9.5 10*3/uL (ref 4.0–10.5)
nRBC: 0 % (ref 0.0–0.2)

## 2023-03-29 LAB — URINALYSIS, ROUTINE W REFLEX MICROSCOPIC
Bilirubin Urine: NEGATIVE
Glucose, UA: >=500 mg/dL — AB
Hgb urine dipstick: NEGATIVE
Ketones, ur: NEGATIVE mg/dL
Leukocytes,Ua: NEGATIVE
Nitrite: NEGATIVE
Protein, ur: NEGATIVE mg/dL
Specific Gravity, Urine: >1.046 — ABNORMAL HIGH (ref 1.005–1.030)
pH: 6 (ref 5.0–8.0)

## 2023-03-29 LAB — BASIC METABOLIC PANEL
Anion gap: 7 (ref 5–15)
BUN: 8 mg/dL (ref 6–20)
CO2: 21 mmol/L — ABNORMAL LOW (ref 22–32)
Calcium: 8.6 mg/dL — ABNORMAL LOW (ref 8.9–10.3)
Chloride: 104 mmol/L (ref 98–111)
Creatinine, Ser: 0.52 mg/dL (ref 0.44–1.00)
GFR, Estimated: >60 mL/min (ref >60)
Glucose, Bld: 326 mg/dL — ABNORMAL HIGH (ref 70–99)
Potassium: 4.1 mmol/L (ref 3.5–5.1)
Sodium: 132 mmol/L — ABNORMAL LOW (ref 135–145)

## 2023-03-29 LAB — RAPID URINE DRUG SCREEN, HOSP PERFORMED
Amphetamines: NOT DETECTED
Barbiturates: NOT DETECTED
Benzodiazepines: NOT DETECTED
Cocaine: NOT DETECTED
Opiates: NOT DETECTED
Tetrahydrocannabinol: NOT DETECTED

## 2023-03-29 LAB — PROTIME-INR
INR: 1.1 (ref 0.8–1.2)
Prothrombin Time: 14.6 s (ref 11.4–15.2)

## 2023-03-29 LAB — CBC
HCT: 44.2 % (ref 36.0–46.0)
Hemoglobin: 14.2 g/dL (ref 12.0–15.0)
MCH: 26.6 pg (ref 26.0–34.0)
MCHC: 32.1 g/dL (ref 30.0–36.0)
MCV: 82.9 fL (ref 80.0–100.0)
Platelets: 214 10*3/uL (ref 150–400)
RBC: 5.33 MIL/uL — ABNORMAL HIGH (ref 3.87–5.11)
RDW: 13.9 % (ref 11.5–15.5)
WBC: 9.7 10*3/uL (ref 4.0–10.5)
nRBC: 0 % (ref 0.0–0.2)

## 2023-03-29 LAB — CBG MONITORING, ED: Glucose-Capillary: 301 mg/dL — ABNORMAL HIGH (ref 70–99)

## 2023-03-29 LAB — PREGNANCY, URINE: Preg Test, Ur: NEGATIVE

## 2023-03-29 LAB — ETHANOL: Alcohol, Ethyl (B): <10 mg/dL (ref <10)

## 2023-03-29 LAB — APTT: aPTT: 27 s (ref 24–36)

## 2023-03-29 MED ORDER — METOCLOPRAMIDE HCL 5 MG/ML IJ SOLN
10.0000 mg | Freq: Once | INTRAMUSCULAR | Status: AC
  Administered 2023-03-29: 10 mg via INTRAVENOUS
  Filled 2023-03-29: qty 2

## 2023-03-29 MED ORDER — IOHEXOL 350 MG/ML SOLN
75.0000 mL | Freq: Once | INTRAVENOUS | Status: AC | PRN
  Administered 2023-03-29: 75 mL via INTRAVENOUS

## 2023-03-29 MED ORDER — BUTALBITAL-APAP-CAFFEINE 50-325-40 MG PO TABS
1.0000 | ORAL_TABLET | Freq: Once | ORAL | Status: AC
  Administered 2023-03-29: 1 via ORAL
  Filled 2023-03-29: qty 1

## 2023-03-29 NOTE — ED Triage Notes (Addendum)
Pt to ED via POV c/o SHOB, HA and numbness to the right side of face and right arm x 3 days. SHOB worse with exertion. [place] Spanish interpretor used to assist with triage. No cough. No neuro deficits noted in triage . Denies weakness. Denies chest pain

## 2023-03-29 NOTE — ED Notes (Signed)
Patient refused MRI and then walked out of ED shortly after.

## 2023-03-29 NOTE — Discharge Instructions (Signed)
You were seen in the emergency department for headache and numbness The CAT scan taken of your head and neck did not show any stroke findings The MRI of your brain did not show any stroke findings either Your headache improved after medications used to treat migraine headaches This may be a migraine You should return to the emergency department for severe headaches, numbness or weakness on one side of your body or any other concerns Otherwise please follow-up with your primary care doctor with 1 week for reevaluation

## 2023-03-29 NOTE — ED Provider Notes (Signed)
[address] EMERGENCY DEPARTMENT AT [facility] Provider Note   CSN: [id number] Arrival date & time: 03/29/23  1053     History  Chief Complaint  Patient presents with   Numbness   Headache   Shortness of Breath    Alexandra Norris is a 47 y.o. female.  With a past medical history of hypertension, hyperlipidemia and type 2 diabetes who presented to ED for headache.  She reports her headache began gradually on the morning of October 17 and has worsened since the onset.  Associated symptoms include right upper extremity numbness, right facial numbness, dizziness, blurred vision in right eye and new onset shortness of breath on exertion.  No fevers, chills, neck pain, chest pain, nausea vomiting or abdominal pain.  No prior history of similar headaches or migraines   Headache Shortness of Breath Associated symptoms: headaches        Home Medications Prior to Admission medications   Medication Sig Start Date End Date Taking? Authorizing Provider  albuterol (VENTOLIN HFA) 108 (90 Base) MCG/ACT inhaler Inhale 2 puffs into the lungs every 6 (six) hours as needed for wheezing or shortness of breath. 10/05/22   [place], Buren Kos, MD  atorvastatin (LIPITOR) 20 MG tablet Take 1 tablet (20 mg total) by mouth at bedtime. 03/04/23   Alyson Reedy, FNP  fluconazole (DIFLUCAN) 150 MG tablet Take 1 tablet today. Within 72 hours, take the second tablet. 02/16/23   Alyson Reedy, FNP  ibuprofen (ADVIL) 800 MG tablet Take 1 tablet (800 mg total) by mouth every 8 (eight) hours as needed. 01/16/23     insulin degludec (TRESIBA FLEXTOUCH) 100 UNIT/ML FlexTouch Pen Inject 20 Units into the skin daily.    [provider]  Insulin Pen Needle (1ST TIER UNIFINE PENTIPS) 32G X 4 MM MISC Use as directed with Evaristo Bury Flextouch pen 03/04/23   Alyson Reedy, FNP  lisinopril (ZESTRIL) 10 MG tablet Take 1 tablet (10 mg total) by mouth daily. 03/04/23   Alyson Reedy, FNP   metFORMIN (GLUCOPHAGE) 500 MG tablet Take 2 tablets (1,000 mg total) by mouth daily with breakfast AND 2 tablets (1,000 mg total) every evening. 02/16/23   Alyson Reedy, FNP  Multiple Vitamin (MULTIVITAMIN WITH MINERALS) TABS tablet Take 1 tablet by mouth daily in the afternoon.    [provider]  nystatin (MYCOSTATIN/NYSTOP) powder Apply 1 Application topically 3 (three) times daily. 02/16/23   Alyson Reedy, FNP      Allergies    Patient has no known allergies.    Review of Systems   Review of Systems  Respiratory:  Positive for shortness of breath.   Neurological:  Positive for headaches.    Physical Exam Updated Vital Signs BP 104/62   Pulse 81   Temp 98.7 F (37.1 C)   Resp 16   Ht 5\' 1"  (1.549 m)   Wt 104.8 kg   LMP 09/20/2019 (Exact Date)   SpO2 97%   BMI 43.65 kg/m  Physical Exam Vitals and nursing note reviewed.  HENT:     Head: Normocephalic and atraumatic.  Eyes:     General: No visual field deficit.    Extraocular Movements: Extraocular movements intact.     Left eye: No nystagmus.     Pupils: Pupils are equal, round, and reactive to light.  Cardiovascular:     Rate and Rhythm: Normal rate and regular rhythm.  Pulmonary:     Effort: Pulmonary effort is normal.     Breath sounds:  Normal breath sounds.  Abdominal:     Palpations: Abdomen is soft.     Tenderness: There is no abdominal tenderness.  Skin:    General: Skin is warm and dry.  Neurological:     Mental Status: She is alert.     Cranial Nerves: No cranial nerve deficit, dysarthria or facial asymmetry.     Sensory: No sensory deficit.     Comments: Paresthesias over right face right upper extremity and right lower extremity 5 out of 5 motor strength bilateral upper and lower extremities No pronator drift No facial asymmetry Clear fluent speech No dysmetria  Psychiatric:        Mood and Affect: Mood normal.     ED Results / Procedures / Treatments   Labs (all labs ordered  are listed, but only abnormal results are displayed) Labs Reviewed  BASIC METABOLIC PANEL - Abnormal; Notable for the following components:      Result Value   Sodium 132 (*)    CO2 21 (*)    Glucose, Bld 326 (*)    Calcium 8.6 (*)    All other components within normal limits  CBC - Abnormal; Notable for the following components:   RBC 5.33 (*)    All other components within normal limits  URINALYSIS, ROUTINE W REFLEX MICROSCOPIC - Abnormal; Notable for the following components:   Specific Gravity, Urine >1.046 (*)    Glucose, UA >=500 (*)    Bacteria, UA RARE (*)    All other components within normal limits  HEPATIC FUNCTION PANEL - Abnormal; Notable for the following components:   Albumin 3.3 (*)    Alkaline Phosphatase 130 (*)    All other components within normal limits  CBC WITH DIFFERENTIAL/PLATELET - Abnormal; Notable for the following components:   RBC 5.34 (*)    All other components within normal limits  CBG MONITORING, ED - Abnormal; Notable for the following components:   Glucose-Capillary 301 (*)    All other components within normal limits  PROTIME-INR  APTT  ETHANOL  RAPID URINE DRUG SCREEN, HOSP PERFORMED  PREGNANCY, URINE    EKG EKG Interpretation Date/Time:  Monday March 29 2023 12:40:22 EDT Ventricular Rate:  92 PR Interval:  150 QRS Duration:  74 QT Interval:  372 QTC Calculation: 460 R Axis:   96  Text Interpretation: Sinus rhythm with Premature atrial complexes with Abberant conduction Rightward axis Confirmed by Estelle June [phone]) on 03/29/2023 6:06:50 PM  Radiology CT ANGIO HEAD NECK W WO CM  Result Date: 03/29/2023 CLINICAL DATA:  Shortness of breath, headache, and right-sided facial and arm numbness. EXAM: CT ANGIOGRAPHY HEAD AND NECK WITH AND WITHOUT CONTRAST TECHNIQUE: Multidetector CT imaging of the head and neck was performed using the standard protocol during bolus administration of intravenous contrast. Multiplanar CT image  reconstructions and MIPs were obtained to evaluate the vascular anatomy. Carotid stenosis measurements (when applicable) are obtained utilizing NASCET criteria, using the distal internal carotid diameter as the denominator. RADIATION DOSE REDUCTION: This exam was performed according to the departmental dose-optimization program which includes automated exposure control, adjustment of the mA and/or kV according to patient size and/or use of iterative reconstruction technique. CONTRAST:  75mL OMNIPAQUE IOHEXOL 350 MG/ML SOLN COMPARISON:  CT head 03/04/2023 FINDINGS: CT HEAD FINDINGS Brain: There is no acute intracranial hemorrhage, extra-axial fluid collection, or acute infarct. Parenchymal volume is normal. The ventricles are normal in size. Gray-white differentiation is preserved. The pituitary and suprasellar region are normal. There is no  mass lesion. There is no mass effect or midline shift. Vascular: See below. Skull: Normal. Negative for fracture or focal lesion. Sinuses/Orbits: The paranasal sinuses are clear. The globes and orbits are unremarkable. Other: The mastoid air cells and middle ear cavities are clear. Review of the MIP images confirms the above findings CTA NECK FINDINGS Aortic arch: The imaged aortic arch is normal. The origins of the major branch vessels are patent. The subclavian arteries are patent to the level imaged. Right carotid system: The right common, internal, and external carotid arteries are patent, without stenosis or occlusion there is no evidence of dissection or aneurysm. Left carotid system: The left common, internal, and external carotid arteries are patent, without stenosis or occlusion. There is no evidence of dissection or aneurysm. Vertebral arteries: The vertebral arteries are patent, without significant stenosis or occlusion there is no evidence of dissection or aneurysm. Skeleton: There is no acute osseous abnormality or suspicious osseous lesion. The patient is status  post C5-C6 ACDF without evidence of complication. There is no visible canal hematoma. Other neck: The soft tissues of the neck are unremarkable. Upper chest: The imaged lung apices are clear. Review of the MIP images confirms the above findings CTA HEAD FINDINGS Anterior circulation: The intracranial ICAs are patent, without significant stenosis or occlusion. The bilateral MCAs and ACAS are patent, without proximal stenosis or occlusion. The anterior communicating artery is normal. There is no aneurysm or AVM. Posterior circulation: The bilateral V4 segments are patent. The basilar artery is patent. The major cerebellar arteries appear patent. The bilateral PCAs are patent, without proximal stenosis or occlusion. A left posterior communicating artery is identified. There is no aneurysm or AVM. Venous sinuses: Patent. Anatomic variants: None. Review of the MIP images confirms the above findings IMPRESSION: 1. Unremarkable noncontrast head CT with no acute intracranial pathology. 2. Normal vasculature of the head and neck with no significant stenosis or occlusion. Electronically Signed   By: Lesia Hausen M.D.   On: 03/29/2023 14:44   DG Chest 2 View  Result Date: 03/29/2023 CLINICAL DATA:  Shortness of breath for 3 days EXAM: CHEST - 2 VIEW COMPARISON:  Chest radiograph 03/04/2023 FINDINGS: The cardiomediastinal silhouette is normal There is no focal consolidation or pulmonary edema. Linear opacities in the lingula likely reflect atelectasis and/or scar. There is no pleural effusion or pneumothorax There is no acute osseous abnormality. Cholecystectomy clips are noted. IMPRESSION: Lingular subsegmental atelectasis/scar. Otherwise, no radiographic evidence of acute cardiopulmonary process. Electronically Signed   By: Lesia Hausen M.D.   On: 03/29/2023 14:32    Procedures Procedures    Medications Ordered in ED Medications  iohexol (OMNIPAQUE) 350 MG/ML injection 75 mL (75 mLs Intravenous Contrast Given  03/29/23 1320)  metoCLOPramide (REGLAN) injection 10 mg (10 mg Intravenous Given 03/29/23 1454)  butalbital-acetaminophen-caffeine (FIORICET) 50-325-40 MG per tablet 1 tablet (1 tablet Oral Given 03/29/23 1456)    ED Course/ Medical Decision Making/ A&P Clinical Course as of 03/29/23 1806  Mon Mar 29, 2023  1505 No acute findings on CT head without contrast or CTA of the head and neck.  Patient reports slight improvement in her headache after migraine interventions.  Discussed with neurologist Dr. Amada Jupiter who recommends MRI brain without contrast.  Most likely etiology at this time would be complex migraine however patient does not have a history of migraines so advanced imaging is merited.  Should MRI brain contrast show no acute findings she would be appropriate for discharge with PCP follow-up [MP]  1805 Jackquline Bosch DO, am transitioning care of this patient to the oncoming provider pending MRI brain without contrast, reevaluation and disposition [MP]    Clinical Course User Index [MP] Royanne Foots, DO                                 Medical Decision Making 47 year old female with history as above presenting for subacute headache, blurred vision, dizziness and paresthesias in the right face right upper extremity and right lower extremity.  Symptoms began in the morning of October 17 and have worsened since the onset.  Paresthesias of right face/upper extremity/lower extremity.  No other focal neurologic deficits on exam.  Presentation most concerning for small vessel ischemia.  Less likely subarachnoid hemorrhage given insidious onset and progressive worsening of the headache.  Will evaluate for stroke with CT head without contrast as well as CTA of the head and neck.  Given reported increased shortness of breath on exertion will obtain chest x-ray to look for any evidence of focal consolidation or pulmonary edema.  Other differential diagnoses include IIH but lower suspicion given  unilateral symptoms.  Will speak with neuro regarding best plan after imaging has been obtained but she will likely need an MRI here.  Will attempt to treat headache with Fioricet and Reglan to see if that helps with her symptoms    NIH Stroke Scale/Score (NIHSS) from [email or web address]  on 03/29/2023 ** All calculations should be rechecked by clinician prior to use **  RESULT SUMMARY: 1 points NIH Stroke Scale   INPUTS: 1A: Level of consciousness -> 0 = Alert; keenly responsive 1B: Ask month and age -> 0 = Both questions right 1C: 'Blink eyes' & 'squeeze hands' -> 0 = Performs both tasks 2: Horizontal extraocular movements -> 0 = Normal 3: Visual fields -> 0 = No visual loss 4: Facial palsy -> 0 = Normal symmetry 5A: Left arm motor drift -> 0 = No drift for 10 seconds 5B: Right arm motor drift -> 0 = No drift for 10 seconds 6A: Left leg motor drift -> 0 = No drift for 5 seconds 6B: Right leg motor drift -> 0 = No drift for 5 seconds 7: Limb Ataxia -> 0 = No ataxia 8: Sensation -> 1 = Mild-moderate loss: less sharp/more dull  9: Language/aphasia -> 0 = Normal; no aphasia 10: Dysarthria -> 0 = Normal 11: Extinction/inattention -> 0 = No abnormality   Amount and/or Complexity of Data Reviewed Labs: ordered. Radiology: ordered.  Risk Prescription drug management.           Final Clinical Impression(s) / ED Diagnoses Final diagnoses:  Nonintractable headache, unspecified chronicity pattern, unspecified headache type  Paresthesia    Rx / DC Orders ED Discharge Orders     None         Royanne Foots, DO 03/29/23 1807

## 2023-03-29 NOTE — ED Provider Triage Note (Cosign Needed)
Emergency Medicine Provider Triage Evaluation Note  Alexandra Norris , a 47 y.o. female  was evaluated in triage.  Interpreter needed for spanish. Pt complains of numbness, headache x 3 days. Tingling on face and right leg. Also reports weakness of the right arm and leg causing fall last night. Reports feeling dizzy feels like "my head is real big". Has nausea without vomiting. No history of same.   Review of Systems  Positive: Headache, right arm and leg weakness, nausea, visual change (blurry) Negative: Sugars have been ok, no fever  Physical Exam  BP 128/76   Pulse 92   Temp 98.9 F (37.2 C) (Oral)   Resp 18   Ht 5\' 1"  (1.549 m)   Wt 104.8 kg   LMP 09/20/2019 (Exact Date)   SpO2 98%   BMI 43.65 kg/m  Gen:   Awake, no distress   Resp:  Normal effort  MSK:   Moves extremities without difficulty  Other:  Decreased sensation right face, arm leg. Pronator drift on right. Grip and leg movement weak on the right.   Medical Decision Making  Medically screening exam initiated at 12:45 PM.  Appropriate orders placed.  Alexandra Norris was informed that the remainder of the evaluation will be completed by another provider, this initial triage assessment does not replace that evaluation, and the importance of remaining in the ED until their evaluation is complete.  Stroke symptoms, outside of window (x3 days), feels worse today.    Elpidio Anis, PA-C 03/29/23 1253

## 2023-03-30 ENCOUNTER — Telehealth: Payer: Self-pay | Admitting: Orthopaedic Surgery

## 2023-03-30 ENCOUNTER — Other Ambulatory Visit: Payer: Self-pay | Admitting: Physician Assistant

## 2023-03-30 ENCOUNTER — Other Ambulatory Visit (HOSPITAL_BASED_OUTPATIENT_CLINIC_OR_DEPARTMENT_OTHER): Payer: Self-pay

## 2023-03-30 MED ORDER — HYDROCODONE-ACETAMINOPHEN 5-325 MG PO TABS
1.0000 | ORAL_TABLET | Freq: Three times a day (TID) | ORAL | 0 refills | Status: DC | PRN
  Filled 2023-03-30: qty 30, 10d supply, fill #0

## 2023-03-30 MED ORDER — ONDANSETRON HCL 4 MG PO TABS
4.0000 mg | ORAL_TABLET | Freq: Three times a day (TID) | ORAL | 0 refills | Status: DC | PRN
  Filled 2023-03-30: qty 40, 14d supply, fill #0

## 2023-03-30 NOTE — Telephone Encounter (Signed)
Yeah agree.  Just saw that her A1c is >12.  That'll need to be closer to 8 before we do surgery

## 2023-03-30 NOTE — Telephone Encounter (Signed)
[name] Laity with [facility] [phone]) called regarding this patient scheduled for left cubital tunnel release. Patient has been very sick the last four weeks with complaints of shortness of breath and coughing.  Patient has had frequent trips to the ED and will need a pulmonary work up.  Also her sugar is running 301-505.  Patient is not optimized for surgery at this time and will need to be cancelled.   In order for this patient to be rescheduled, she will need medical clearance from her PCP.

## 2023-03-31 ENCOUNTER — Encounter (HOSPITAL_BASED_OUTPATIENT_CLINIC_OR_DEPARTMENT_OTHER): Payer: Self-pay | Admitting: Family Medicine

## 2023-03-31 ENCOUNTER — Ambulatory Visit (HOSPITAL_BASED_OUTPATIENT_CLINIC_OR_DEPARTMENT_OTHER): Payer: Self-pay | Admitting: Family Medicine

## 2023-03-31 ENCOUNTER — Other Ambulatory Visit (HOSPITAL_BASED_OUTPATIENT_CLINIC_OR_DEPARTMENT_OTHER): Payer: Self-pay

## 2023-03-31 VITALS — BP 113/69 | HR 84 | Ht 61.0 in | Wt 230.0 lb

## 2023-03-31 DIAGNOSIS — R519 Headache, unspecified: Secondary | ICD-10-CM

## 2023-03-31 DIAGNOSIS — Z135 Encounter for screening for eye and ear disorders: Secondary | ICD-10-CM

## 2023-03-31 DIAGNOSIS — E1165 Type 2 diabetes mellitus with hyperglycemia: Secondary | ICD-10-CM

## 2023-03-31 DIAGNOSIS — Z7984 Long term (current) use of oral hypoglycemic drugs: Secondary | ICD-10-CM

## 2023-03-31 MED ORDER — KETOROLAC TROMETHAMINE 60 MG/2ML IM SOLN
60.0000 mg | Freq: Once | INTRAMUSCULAR | Status: AC
  Administered 2023-03-31: 60 mg via INTRAMUSCULAR

## 2023-03-31 MED ORDER — BUTALBITAL-APAP-CAFFEINE 50-325-40 MG PO TABS
1.0000 | ORAL_TABLET | Freq: Four times a day (QID) | ORAL | 0 refills | Status: DC | PRN
  Filled 2023-03-31: qty 14, 4d supply, fill #0

## 2023-03-31 NOTE — Assessment & Plan Note (Signed)
Patient is a 47 year old female patient who presents for a follow-up regarding her recent ED visit. She was seen on 10/21 for subacute headache that started on 10/17. She reports she experienced relief with migraine cocktail in ED. Review of CT of her head with no acute findings. Denies N/V, paresthesia of face, slurred speech, dysphagia, photophobia, phonophobia. She reports occasional headaches, dizziness, and blurred vision. Patient is well-appearing and in no acute distress. Patient is alert and oriented to person, time, place and self- speech is fluent and smooth- not dysarthric. Recent and remote memory are intact. Attention and concentration are normal. CN 2-12 intact, along with sensation. Motor strength 5/5- no weakness present in upper or lower extremities. No acute red flags present on exam. Due to ongoing symptoms, reasonable to complete MRI of head since this was recommended in the ED. Patient agreeable to plan of care. Will update patient with results of MRI.

## 2023-03-31 NOTE — Patient Instructions (Addendum)
I want you to increase your Tresiba by 2 units every 2-3 days until your fasting blood sugar is around 130 mg/dL FASTING   MyChart:  For all urgent or time sensitive needs we ask that you please call the office to avoid delays. Our number is [phone]. MyChart is not constantly monitored and due to the large volume of messages a day, replies may take up to 72 business hours.   MyChart Policy: MyChart allows for you to see your visit notes, after visit summary, provider recommendations, lab and tests results, make an appointment, request refills, and contact your provider or the office for non-urgent questions or concerns. Providers are seeing patients during normal business hours and do not have built in time to review MyChart messages.  We ask that you allow a minimum of 3 business days for responses to [employer]. For this reason, please do not send urgent requests through MyChart. Please call the office at [phone]. New and ongoing conditions may require a visit. We have virtual and in person visit available for your convenience.  Complex MyChart concerns may require a visit. Your provider may request you schedule a virtual or in person visit to ensure we are providing the best care possible. MyChart messages sent after 11:00 AM on Friday will not be received by the provider until Monday morning.    Lab and Test Results: You will receive your lab and test results on MyChart as soon as they are completed and results have been sent by the lab or testing facility. Due to this service, you will receive your results BEFORE your provider.  I review lab and tests results each morning prior to seeing patients. Some results require collaboration with other providers to ensure you are receiving the most appropriate care. For this reason, we ask that you please allow a minimum of 3-5 business days from the time the ALL results have been received for your provider to receive and review lab and  test results and contact you about these.  Most lab and test result comments from the provider will be sent through MyChart. Your provider may recommend changes to the plan of care, follow-up visits, repeat testing, ask questions, or request an office visit to discuss these results. You may reply directly to this message or call the office at [phone] to provide information for the provider or set up an appointment. In some instances, you will be called with test results and recommendations. Please let us know if this is preferred and we will make note of this in your chart to provide this for you.    If you have not heard a response to your lab or test results in 5 business days from all results returning to MyChart, please call the office to let us know. We ask that you please avoid calling prior to this time unless there is an emergent concern. Due to high call volumes, this can delay the resulting process.   After Hours: For all non-emergency after hours needs, please call the office at [phone] and select the option to reach the on-call provider service. On-call services are shared between multiple [facility] and therefore it will not be possible to speak directly with your provider. On-call providers may provide medical advice and recommendations, but are unable to provide refills for maintenance medications.  For all emergency or urgent medical needs after normal business hours, we recommend that you seek care at the closest Urgent Care or Emergency Department to  ensure appropriate treatment in a timely manner.  [facility] [place] has a 24 hour emergency room located on the ground floor for your convenience.    Urgent Concerns During the Business Day Providers are seeing patients from 8AM to 5PM, Monday through Thursday, and 8AM to 12PM on Friday with a busy schedule and are most often not able to respond to non-urgent calls until the end of the day or the next  business day. If you should have URGENT concerns during the day, please call and speak to the nurse or schedule a same day appointment so that we can address your concern without delay.    Thank you, again, for choosing me as your health care partner. I appreciate your trust and look forward to learning more about you.    Alyson Reedy, FNP-C

## 2023-03-31 NOTE — Assessment & Plan Note (Signed)
Currently taking Tresiba 20 units daily & metformin 1000mg  BID. Discussed importance of blood sugar monitoring, along with healthy lifestyle modifications. Would like better control of fasting blood sugars. Discussed increasing Tresiba by 2 units every 2-3 days until fasting blood sugar is around 130 mg/dL. Patient verbalized an understanding. Recent hemoglobin A1c done on 02/04/2023 and is not due at this visit and informed patient that she would have to postpone her elbow surgery until we can recheck her A1c and have it be less than 8 per Dr. Roda Shutters.

## 2023-03-31 NOTE — Progress Notes (Signed)
Established Patient Office Visit  Subjective   Patient ID: Alexandra Norris, female    DOB: June 12, 1975  Age: 47 y.o. MRN: [id number]  Chief Complaint  Patient presents with   Headache    Feels about the same, is using OTC tylenol at home. Laying down is the only thing that helps, light or noise does not bother it. Not the frontal area, her whole head   Alexandra Norris is a 47 year old who presents today for ED follow-up. She presents to the visit with her husband and a [employer] Spanish interpreter.   She was seen at [facility] on 10/21 for a subacute/gradual headache that started on 10/17. Head CT- no acute findings, slight improvement with migraine interventions in hospital. Recommended MRI brain w contrast but patient left.  She reports still dealing with blurred vision, dizziness, R upper ext feels weak. Denies paresthesia of face, slurred speech, dysphagia, photophobia, phonophobia.   Tylenol helps a little bit. Sometimes she has nausea but not at visit today.  Symptoms have been present  since the morning of 10/17 There's times her headache is so strong, she takes tylenol and forgets she has a headache  Pain severity: 6/10  Treatments tried include : Tylenol does decrease the pain but only helps a little bit, decreases the pain to 2-3/10   Blood sugars: after dinner last night was 230 mg/dL Currently taking Tresiba 20 units daily & metformin 1000mg  BID  Fasting sugars: lowest has been 150 & highest fasting has been 193 mg/dL   Review of Systems  Constitutional:  Negative for malaise/fatigue.  Respiratory:  Negative for cough and shortness of breath.   Cardiovascular:  Negative for chest pain and palpitations.  Gastrointestinal:  Negative for abdominal pain and vomiting.  Musculoskeletal:  Negative for myalgias.  Neurological:  Positive for headaches. Negative for dizziness, tingling, tremors, speech change, focal weakness and weakness.   Endo/Heme/Allergies:  Negative for polydipsia.  Psychiatric/Behavioral:  Negative for depression and suicidal ideas. The patient is not nervous/anxious and does not have insomnia.     Objective:    BP 113/69   Pulse 84   Ht 5\' 1"  (1.549 m)   Wt 230 lb (104.3 kg)   LMP 09/20/2019 (Exact Date)   SpO2 98%   BMI 43.46 kg/m  BP Readings from Last 3 Encounters:  03/31/23 113/69  03/29/23 104/62  03/09/23 134/69     Physical Exam Vitals reviewed.  Constitutional:      Appearance: Normal appearance. She is well-developed.  Eyes:     Extraocular Movements: Extraocular movements intact.     Pupils: Pupils are equal, round, and reactive to light.  Cardiovascular:     Rate and Rhythm: Normal rate and regular rhythm.     Heart sounds: Normal heart sounds.  Pulmonary:     Effort: Pulmonary effort is normal.     Breath sounds: Normal breath sounds.  Neurological:     Mental Status: She is alert.     GCS: GCS eye subscore is 4. GCS verbal subscore is 5. GCS motor subscore is 6.     Cranial Nerves: No cranial nerve deficit or dysarthria.      Assessment & Plan:   Acute intractable headache, unspecified headache type Assessment & Plan: Patient is a 47 year old female patient who presents for a follow-up regarding her recent ED visit. She was seen on 10/21 for subacute headache that started on 10/17. She reports she experienced relief with migraine cocktail in ED. Review of  CT of her head with no acute findings. Denies N/V, paresthesia of face, slurred speech, dysphagia, photophobia, phonophobia. She reports occasional headaches, dizziness, and blurred vision. Patient is well-appearing and in no acute distress. Patient is alert and oriented to person, time, place and self- speech is fluent and smooth- not dysarthric. Recent and remote memory are intact. Attention and concentration are normal. CN 2-12 intact, along with sensation. Motor strength 5/5- no weakness present in upper or lower  extremities. No acute red flags present on exam. Due to ongoing symptoms, reasonable to complete MRI of head since this was recommended in the ED. Patient agreeable to plan of care. Will update patient with results of MRI.    Orders: -     Butalbital-APAP-Caffeine; Take 1 tablet by mouth every 6 (six) hours as needed for headache.  Dispense: 14 tablet; Refill: 0 -     MR BRAIN W WO CONTRAST; Future -     Ketorolac Tromethamine  Uncontrolled type 2 diabetes mellitus with hyperglycemia (HCC) Assessment & Plan: Currently taking Tresiba 20 units daily & metformin 1000mg  BID. Discussed importance of blood sugar monitoring, along with healthy lifestyle modifications. Would like better control of fasting blood sugars. Discussed increasing Tresiba by 2 units every 2-3 days until fasting blood sugar is around 130 mg/dL. Patient verbalized an understanding. Recent hemoglobin A1c done on 02/04/2023 and is not due at this visit and informed patient that she would have to postpone her elbow surgery until we can recheck her A1c and have it be less than 8 per Dr. Roda Shutters.     Screening for diabetic retinopathy -     Ambulatory referral to Ophthalmology     Return in about 5 weeks (around 05/07/2023) for Diabetes f/u.    Alyson Reedy, FNP

## 2023-04-01 ENCOUNTER — Ambulatory Visit: Payer: Self-pay | Attending: Orthopaedic Surgery

## 2023-04-01 ENCOUNTER — Other Ambulatory Visit: Payer: Self-pay

## 2023-04-01 DIAGNOSIS — M25561 Pain in right knee: Secondary | ICD-10-CM | POA: Insufficient documentation

## 2023-04-01 DIAGNOSIS — M5412 Radiculopathy, cervical region: Secondary | ICD-10-CM | POA: Insufficient documentation

## 2023-04-01 DIAGNOSIS — M25511 Pain in right shoulder: Secondary | ICD-10-CM | POA: Insufficient documentation

## 2023-04-01 DIAGNOSIS — M542 Cervicalgia: Secondary | ICD-10-CM | POA: Insufficient documentation

## 2023-04-01 DIAGNOSIS — G8929 Other chronic pain: Secondary | ICD-10-CM | POA: Insufficient documentation
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
## 2023-04-07 ENCOUNTER — Encounter (HOSPITAL_BASED_OUTPATIENT_CLINIC_OR_DEPARTMENT_OTHER): Payer: Self-pay

## 2023-04-07 ENCOUNTER — Encounter: Payer: Self-pay | Admitting: Orthopaedic Surgery

## 2023-04-07 ENCOUNTER — Ambulatory Visit (HOSPITAL_BASED_OUTPATIENT_CLINIC_OR_DEPARTMENT_OTHER): Admit: 2023-04-07 | Payer: Self-pay | Admitting: Orthopaedic Surgery

## 2023-04-07 SURGERY — CUBITAL TUNNEL RELEASE
Anesthesia: General | Laterality: Left

## 2023-04-08 ENCOUNTER — Ambulatory Visit
Admission: RE | Admit: 2023-04-08 | Discharge: 2023-04-08 | Disposition: A | Discharge status: Home / Self Care | Payer: Self-pay | Source: Ambulatory Visit | Attending: Family Medicine | Admitting: Family Medicine

## 2023-04-08 DIAGNOSIS — R519 Headache, unspecified: Secondary | ICD-10-CM

## 2023-04-08 MED ORDER — GADOPICLENOL 0.5 MMOL/ML IV SOLN
10.0000 mL | Freq: Once | INTRAVENOUS | Status: AC | PRN
  Administered 2023-04-08: 10 mL via INTRAVENOUS

## 2023-04-09 ENCOUNTER — Ambulatory Visit (INDEPENDENT_AMBULATORY_CARE_PROVIDER_SITE_OTHER): Payer: Self-pay | Admitting: Physician Assistant

## 2023-04-09 ENCOUNTER — Other Ambulatory Visit (HOSPITAL_BASED_OUTPATIENT_CLINIC_OR_DEPARTMENT_OTHER): Payer: Self-pay | Admitting: Family Medicine

## 2023-04-09 ENCOUNTER — Encounter: Payer: Self-pay | Admitting: Physician Assistant

## 2023-04-09 DIAGNOSIS — M542 Cervicalgia: Secondary | ICD-10-CM

## 2023-04-09 DIAGNOSIS — G43019 Migraine without aura, intractable, without status migrainosus: Secondary | ICD-10-CM

## 2023-04-09 MED ORDER — MELOXICAM 7.5 MG PO TABS: 7.5000 mg | ORAL_TABLET | Freq: Every day | ORAL | 0 refills | Status: DC

## 2023-04-09 MED ORDER — SUMATRIPTAN SUCCINATE 50 MG PO TABS: ORAL_TABLET | ORAL | 0 refills | Status: DC

## 2023-04-09 NOTE — Progress Notes (Signed)
Office Visit Note   Patient: Alexandra Norris           Date of Birth: 1975/10/08           MRN: [id number] Visit Date: 04/09/2023              Requested by: Alyson Reedy, FNP [address] [place],  [place] [address] PCP: Alyson Reedy, FNP  Chief Complaint  Patient presents with   Neck - Pain, Follow-up      HPI: Patient is a pleasant 47 year old who is here in follow-up for her neck pain with radicular right-sided symptoms.  She says it is about the same.  Unfortunately she cannot take steroids because of her A1c.  She has only done 1 physical therapy session.  She has had no change in her symptoms.  She is also supposed to get cubital tunnel surgery but cannot because of her A1c  Assessment & Plan: Visit Diagnoses: Neck pain  Plan: I had a long discussion with the patient.  She cannot take ibuprofen does not have any digestive issues.  She will stop ibuprofen we will place her on Mobic 7.5 she knows not to take any other anti-inflammatories with it would like for her to complete physical therapy her exam is stable.  I would like her to follow back up with Dr. Christell Constant in about a month  Follow-Up Instructions: 1 mont  Ortho Exam Examination she has good range of motion of her neck.  She does have some paresthesias going down her right arm.  She has good grip strength good abductor flexor and extension strength of her arms.  Neurovascular intact no redness Patient is alert, oriented, no adenopathy, well-dressed, normal affect, normal respiratory effort.   Imaging: MR Brain W Wo Contrast  Result Date: 04/08/2023 CLINICAL DATA:  Provided history: Acute intractable headache, unspecified headache type. Headache, increasing frequency or severity. EXAM: MRI HEAD WITHOUT AND WITH CONTRAST TECHNIQUE: Multiplanar, multiecho pulse sequences of the brain and surrounding structures were obtained without and with intravenous contrast. CONTRAST:  10 mL Vueway  intravenous contrast. COMPARISON:  Non-contrast head CT and CT angiogram head/neck 03/29/2023. FINDINGS: Brain: Cerebral volume is normal. Mild multifocal T2 FLAIR hyperintense signal abnormality within the cerebral white matter (with a frontal lobe and subcortical white matter predominance). No cortical encephalomalacia is identified. There is no acute infarct. No evidence of an intracranial mass. No chronic intracranial blood products. No extra-axial fluid collection. No midline shift. No pathologic intracranial enhancement identified. Vascular: Maintained flow voids within the proximal large arterial vessels. Skull and upper cervical spine: No focal worrisome marrow lesion. Incompletely assessed cervical spondylosis. Sinuses/Orbits: No mass or acute finding within the imaged orbits. No significant paranasal sinus disease. IMPRESSION: 1.  No evidence of an acute intracranial abnormality. 2. Mild multifocal T2 FLAIR hyperintense signal abnormality within the cerebral white matter. These signal changes are nonspecific and differential considerations include chronic small vessel ischemic disease and sequelae of chronic migraine headaches, among others. Electronically Signed   By: Jackey Loge D.O.   On: 04/08/2023 14:48   No images are attached to the encounter.  Labs: Lab Results  Component Value Date   HGBA1C 12.4 (H) 02/04/2023   HGBA1C 8.4 (H) 12/16/2021   HGBA1C 9.0 (H) 08/18/2021   REPTSTATUS 09/23/2019 FINAL 09/21/2019   GRAMSTAIN  03/16/2013    RARE WBC PRESENT,BOTH PMN AND MONONUCLEAR NO SQUAMOUS EPITHELIAL CELLS SEEN NO ORGANISMS SEEN Performed at [facility]   CULT >=100,000  COLONIES/mL ESCHERICHIA COLI (A) 09/21/2019   LABORGA ESCHERICHIA COLI (A) 09/21/2019     Lab Results  Component Value Date   ALBUMIN 3.3 (L) 03/29/2023   ALBUMIN 3.9 03/04/2023   ALBUMIN 4.2 06/17/2022    No results found for: "MG" Lab Results  Component Value Date   VD25OH 19.1 (L) 08/18/2021    VD25OH 29.8 (L) 04/22/2021   VD25OH 22 (L) 09/05/2015    No results found for: "PREALBUMIN"    Latest Ref Rng & Units 03/29/2023   11:22 AM 03/03/2023   11:14 PM 03/03/2023   10:28 PM  CBC EXTENDED  WBC 4.0 - 10.5 K/uL 4.0 - 10.5 K/uL 9.7    9.5   10.1   RBC 3.87 - 5.11 MIL/uL 3.87 - 5.11 MIL/uL 5.33    5.34   5.24   Hemoglobin 12.0 - 15.0 g/dL 42.5 - 95.6 g/dL 38.7    56.4  33.2  95.1   HCT 36.0 - 46.0 % 36.0 - 46.0 % 44.2    44.4  46.0  43.2   Platelets 150 - 400 K/uL 150 - 400 K/uL 214    221   190   NEUT# 1.7 - 7.7 K/uL 5.7     Lymph# 0.7 - 4.0 K/uL 2.8        There is no height or weight on file to calculate BMI.  Orders:  No orders of the defined types were placed in this encounter.  Meds ordered this encounter  Medications   meloxicam (MOBIC) 7.5 MG tablet    Sig: Take 1 tablet (7.5 mg total) by mouth daily.    Dispense:  30 tablet    Refill:  0     Procedures: No procedures performed  Clinical Data: No additional findings.  ROS:  All other systems negative, except as noted in the HPI. Review of Systems  Objective: Vital Signs: LMP 09/20/2019 (Exact Date)   Specialty Comments:  No specialty comments available.  PMFS History: Patient Active Problem List   Diagnosis Date Noted   Acute intractable headache 03/31/2023   Shortness of breath 03/09/2023   Uncontrolled type 2 diabetes mellitus with hyperglycemia (HCC) 07/28/2022   Asthma due to seasonal allergies 09/25/2021   Arthritis of right acromioclavicular joint 04/24/2021   Chronic left shoulder pain 04/22/2021   Hypertension associated with diabetes (HCC) 04/22/2021   Hyperlipidemia associated with type 2 diabetes mellitus (HCC) 04/22/2021   Fatty liver disease, nonalcoholic 09/09/2020   Vitamin D deficiency 09/25/2015   Anxiety and depression 07/06/2014   GASTROESOPHAGEAL REFLUX DISEASE 08/14/2009   Past Medical History:  Diagnosis Date   Anemia    Anemia    Anxiety    Arthritis     Asthma    Bilateral chronic knee pain 09/25/2015   Carpal tunnel syndrome    Cervical disc disorder at C5-C6 level with radiculopathy 04/03/2020   Cervical post-laminectomy syndrome 03/06/2022   Cubital tunnel syndrome on right 12/02/2021   Depression    DEPRESSION, SITUATIONAL, PROLONGED 02/05/2009   Qualifier: Diagnosis of  By: Wallene Huh  MD, Khary     Diabetes mellitus without complication (HCC)    Dizziness 04/22/2021   Dyspnea    Elevated blood-pressure reading, without diagnosis of hypertension 06/17/2020   Endometriosis    Fatigue 04/22/2021   History of blood transfusion 1999   w/vaginal delivery   History of bronchitis    "I've stayed in hospital 2 X w/bronchitis"   Hyperlipidemia    Hypertension  Impingement syndrome of left shoulder    Influenza vaccine needed 04/03/2020   Left carpal tunnel syndrome 07/11/2019   Migraine    Moderate persistent asthma with acute exacerbation 12/06/2006   Qualifier: Diagnosis of  By: Wallene Huh  MD, Rande Lawman     Nasal obstruction 02/18/2016   Formatting of this note might be different from the original.  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   Pre-diabetes 10/03/2019   A1C 6.4   Right carpal tunnel syndrome 07/11/2019   S/P arthroscopy of right shoulder 07/08/2021   S/P carpal tunnel release 08/30/2019   Seasonal allergies 10/08/2016   Shingles 11/14/2020   Sinusitis, bacterial 10/07/2021   Snoring 01/20/2022    Family History  Problem Relation Age of Onset   Diabetes Mother    Osteoarthritis Mother    Hypertension Mother    Hyperlipidemia Mother    Arthritis Mother    Colon cancer Mother     Past Surgical History:  Procedure Laterality Date   ABDOMINAL HYSTERECTOMY  10/17/2019   APPENDECTOMY     CARPAL TUNNEL RELEASE Right 07/19/2019   Procedure: RIGHT CARPAL TUNNEL RELEASE;  Surgeon: Tarry Kos, MD;  Location: [facility];  Service: Orthopedics;  Laterality: Right;   CARPAL TUNNEL RELEASE Left 09/13/2019   Procedure: LEFT CARPAL TUNNEL RELEASE;  Surgeon: Tarry Kos, MD;  Location: [facility];  Service: Orthopedics;  Laterality: Left;  Bier block   CESAREAN SECTION  1997; 2008   CHOLECYSTECTOMY  2011   SHOULDER ARTHROSCOPY WITH DISTAL CLAVICLE RESECTION  02/12/2021   Procedure: SHOULDER ARTHROSCOPY WITH DISTAL CLAVICLE RESECTION;  Surgeon: Tarry Kos, MD;  Location: [facility];  Service: Orthopedics;;   SHOULDER ARTHROSCOPY WITH ROTATOR CUFF REPAIR AND SUBACROMIAL DECOMPRESSION Left 02/12/2021   Procedure: LEFT SHOULDER ARTHROSCOPY WITH ROTATOR CUFF REPAIR, SUBACROMIAL DECOMPRESSION, EXTENSIVE DEBRIDEMENT;  Surgeon: Tarry Kos, MD;  Location: [facility];  Service: Orthopedics;  Laterality: Left;   TONSILLECTOMY     tonsilletomy     TUBAL LIGATION  04/2007   ULNAR TUNNEL RELEASE Right 12/17/2021   Procedure: RIGHT CUBITAL TUNNEL RELEASE;  Surgeon: Tarry Kos, MD;  Location: [facility];  Service: Orthopedics;  Laterality: Right;   VAGINAL HYSTERECTOMY Bilateral 10/17/2019   Procedure: HYSTERECTOMY VAGINAL WITH SALPINGECTOMY;  Surgeon: Hermina Staggers, MD;  Location[facility];  Service: Gynecology;  Laterality: Bilateral;   Social History   Occupational History   Not on file  Tobacco Use   Smoking status: Never   Smokeless tobacco: Never  Vaping Use   Vaping status: Never Used  Substance and Sexual Activity   Alcohol use: No   Drug use: No   Sexual activity: Yes    Birth control/protection: Surgical

## 2023-04-13 ENCOUNTER — Ambulatory Visit: Payer: Self-pay

## 2023-04-15 ENCOUNTER — Ambulatory Visit: Payer: Self-pay | Attending: Orthopaedic Surgery

## 2023-04-15 DIAGNOSIS — M5412 Radiculopathy, cervical region: Secondary | ICD-10-CM

## 2023-04-15 DIAGNOSIS — G8929 Other chronic pain: Secondary | ICD-10-CM

## 2023-04-15 DIAGNOSIS — M25511 Pain in right shoulder: Secondary | ICD-10-CM | POA: Insufficient documentation

## 2023-04-15 DIAGNOSIS — M6281 Muscle weakness (generalized): Secondary | ICD-10-CM
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
## 2023-04-16 ENCOUNTER — Encounter: Payer: Self-pay | Admitting: Orthopaedic Surgery

## 2023-04-17 ENCOUNTER — Emergency Department (HOSPITAL_COMMUNITY): Payer: Self-pay

## 2023-04-17 ENCOUNTER — Emergency Department (HOSPITAL_COMMUNITY)
Admission: EM | Admit: 2023-04-17 | Discharge: 2023-04-17 | Disposition: A | Discharge status: Home / Self Care | Payer: Self-pay | Attending: Emergency Medicine | Admitting: Emergency Medicine

## 2023-04-17 ENCOUNTER — Other Ambulatory Visit: Payer: Self-pay

## 2023-04-17 ENCOUNTER — Encounter (HOSPITAL_COMMUNITY): Payer: Self-pay

## 2023-04-17 DIAGNOSIS — W108XXA Fall (on) (from) other stairs and steps, initial encounter: Secondary | ICD-10-CM | POA: Insufficient documentation

## 2023-04-17 DIAGNOSIS — S93602A Unspecified sprain of left foot, initial encounter: Secondary | ICD-10-CM | POA: Insufficient documentation

## 2023-04-17 MED ORDER — IBUPROFEN 400 MG PO TABS
600.0000 mg | ORAL_TABLET | Freq: Once | ORAL | Status: AC
  Administered 2023-04-17: 600 mg via ORAL
  Filled 2023-04-17: qty 1

## 2023-04-17 NOTE — ED Provider Notes (Signed)
[place] EMERGENCY DEPARTMENT AT [facility] Provider Note   CSN: [id number] Arrival date & time: 04/17/23  1005     History  Chief Complaint  Patient presents with   Alexandra Norris is a 47 y.o. female.  HPI 47 year old female presents with a left ankle/foot injury.  History is primarily from the son who translates for the patient.  This morning she was leaving the house and missed the last step and twisted her foot.  Is primarily having proximal left foot pain and swelling.  Has been able to walk but has to go slowly.  Home Medications Prior to Admission medications   Medication Sig Start Date End Date Taking? Authorizing Provider  albuterol (VENTOLIN HFA) 108 (90 Base) MCG/ACT inhaler Inhale 2 puffs into the lungs every 6 (six) hours as needed for wheezing or shortness of breath. 10/05/22   [place], Buren Kos, MD  atorvastatin (LIPITOR) 20 MG tablet Take 1 tablet (20 mg total) by mouth at bedtime. 03/04/23   Alyson Reedy, FNP  butalbital-acetaminophen-caffeine (FIORICET) [id number] MG tablet Take 1 tablet by mouth every 6 (six) hours as needed for headache. 03/31/23   Alyson Reedy, FNP  fluconazole (DIFLUCAN) 150 MG tablet Take 1 tablet today. Within 72 hours, take the second tablet. 02/16/23   Alyson Reedy, FNP  ibuprofen (ADVIL) 800 MG tablet Take 1 tablet (800 mg total) by mouth every 8 (eight) hours as needed. 01/16/23     insulin degludec (TRESIBA FLEXTOUCH) 100 UNIT/ML FlexTouch Pen Inject 20 Units into the skin daily.    [provider]  Insulin Pen Needle (1ST TIER UNIFINE PENTIPS) 32G X 4 MM MISC Use as directed with Evaristo Bury Flextouch pen 03/04/23   Alyson Reedy, FNP  lisinopril (ZESTRIL) 10 MG tablet Take 1 tablet (10 mg total) by mouth daily. 03/04/23   Alyson Reedy, FNP  meloxicam (MOBIC) 7.5 MG tablet Take 1 tablet (7.5 mg total) by mouth daily. 04/09/23   Persons, [place], PA  metFORMIN (GLUCOPHAGE) 500 MG  tablet Take 2 tablets (1,000 mg total) by mouth daily with breakfast AND 2 tablets (1,000 mg total) every evening. 02/16/23   Alyson Reedy, FNP  Multiple Vitamin (MULTIVITAMIN WITH MINERALS) TABS tablet Take 1 tablet by mouth daily in the afternoon.    [provider]  nystatin (MYCOSTATIN/NYSTOP) powder Apply 1 Application topically 3 (three) times daily. 02/16/23   Alyson Reedy, FNP  ondansetron (ZOFRAN) 4 MG tablet Take 1 tablet (4 mg total) by mouth every 8 (eight) hours as needed for nausea or vomiting. 03/30/23   Cristie Hem, PA-C  SUMAtriptan (IMITREX) 50 MG tablet May repeat dose (1 tablet) in 2 hours if headache persists or recurs. Do not take more than 10 tablets in 1 month. 04/09/23   Alyson Reedy, FNP      Allergies    Patient has no known allergies.    Review of Systems   Review of Systems  Musculoskeletal:  Positive for arthralgias and joint swelling.    Physical Exam Updated Vital Signs BP 130/65 (BP Location: Left Arm)   Pulse 80   Temp 98.2 F (36.8 C)   Resp 18   Ht 5\' 1"  (1.549 m)   Wt 104 kg   LMP 09/20/2019 (Exact Date)   SpO2 98%   BMI 43.32 kg/m  Physical Exam Vitals and nursing note reviewed.  Constitutional:      Appearance: She is well-developed.  HENT:     Head:  Normocephalic and atraumatic.  Cardiovascular:     Rate and Rhythm: Normal rate and regular rhythm.     Pulses:          Dorsalis pedis pulses are 2+ on the left side.  Pulmonary:     Effort: Pulmonary effort is normal.  Musculoskeletal:     Left ankle: Decreased range of motion.     Left Achilles Tendon: No tenderness or defects.     Left foot: Swelling (proximal) and tenderness (proximal) present. No deformity.  Skin:    General: Skin is warm and dry.  Neurological:     Mental Status: She is alert.     ED Results / Procedures / Treatments   Labs (all labs ordered are listed, but only abnormal results are displayed) Labs Reviewed - No data to  display  EKG None  Radiology DG Ankle Complete Left  Result Date: 04/17/2023 CLINICAL DATA:  Status post fall. EXAM: LEFT ANKLE COMPLETE - 3+ VIEW COMPARISON:  None Available. FINDINGS: There is no evidence of fracture, dislocation, or joint effusion. Plantar heel spur. There is no evidence of arthropathy or other focal bone abnormality. Soft tissues are unremarkable. IMPRESSION: 1. No acute findings. 2. Plantar heel spur. Electronically Signed   By: Signa Kell M.D.   On: 04/17/2023 11:18   DG Foot Complete Left  Result Date: 04/17/2023 CLINICAL DATA:  Status post fall. EXAM: LEFT FOOT - COMPLETE 3+ VIEW COMPARISON:  None Available. FINDINGS: There is no evidence of fracture or dislocation. Plantar heel spur. There is no evidence of arthropathy or other focal bone abnormality. Soft tissues are unremarkable. IMPRESSION: 1. No acute findings. 2. Plantar heel spur. Electronically Signed   By: Signa Kell M.D.   On: 04/17/2023 11:17    Procedures Procedures    Medications Ordered in ED Medications  ibuprofen (ADVIL) tablet 600 mg (has no administration in time range)    ED Course/ Medical Decision Making/ A&P                                 Medical Decision Making Amount and/or Complexity of Data Reviewed Radiology: ordered and independent interpretation performed.    Details: No fractures   She is gingerly able to range her left ankle but it is painful.  Primarily seems to have swelling to the proximal foot.  X-rays are unremarkable.  Likely a sprain.  Will provide Ace wrap, NSAIDs, and ice.  Will discharge home with return precautions.  Neurovascular intact.        Final Clinical Impression(s) / ED Diagnoses Final diagnoses:  Foot sprain, left, initial encounter    Rx / DC Orders ED Discharge Orders     None         Pricilla Loveless, MD 04/17/23 1237

## 2023-04-17 NOTE — ED Triage Notes (Signed)
Pt states she tripped and fell and hurt left ankle. Denies hurting anything else, denies LOC.

## 2023-04-17 NOTE — Discharge Instructions (Signed)
Use ibuprofen and/or tylenol to help with pain and swelling. Apply ice to the area.   If you develop new or worsening pain, numbness, or any other new/concerning symptoms then return to the ER or call 911.  Use ibuprofeno y/o tylenol para ayudar con el dolor y la hinchazn. Aplica hielo en el rea.   Si presenta dolor, entumecimiento o cualquier otro sntoma nuevo o preocupante nuevo o que empeora, regrese a la sala de emergencias o llame al 911.

## 2023-04-22 ENCOUNTER — Ambulatory Visit: Payer: Self-pay

## 2023-04-23 ENCOUNTER — Ambulatory Visit: Payer: Self-pay

## 2023-04-27 ENCOUNTER — Ambulatory Visit: Payer: Self-pay

## 2023-04-28 ENCOUNTER — Other Ambulatory Visit: Payer: Self-pay | Admitting: Obstetrics and Gynecology

## 2023-04-28 DIAGNOSIS — Z1231 Encounter for screening mammogram for malignant neoplasm of breast: Secondary | ICD-10-CM

## 2023-04-29 ENCOUNTER — Encounter (HOSPITAL_BASED_OUTPATIENT_CLINIC_OR_DEPARTMENT_OTHER): Payer: Self-pay | Admitting: Family Medicine

## 2023-04-29 ENCOUNTER — Ambulatory Visit: Payer: Self-pay

## 2023-04-29 DIAGNOSIS — M5412 Radiculopathy, cervical region: Secondary | ICD-10-CM

## 2023-04-29 DIAGNOSIS — M6281 Muscle weakness (generalized): Secondary | ICD-10-CM

## 2023-04-29 DIAGNOSIS — G8929 Other chronic pain: Secondary | ICD-10-CM
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
## 2023-04-30 ENCOUNTER — Ambulatory Visit (INDEPENDENT_AMBULATORY_CARE_PROVIDER_SITE_OTHER): Payer: Self-pay | Admitting: Family Medicine

## 2023-04-30 ENCOUNTER — Other Ambulatory Visit (HOSPITAL_BASED_OUTPATIENT_CLINIC_OR_DEPARTMENT_OTHER): Payer: Self-pay

## 2023-04-30 ENCOUNTER — Encounter (HOSPITAL_BASED_OUTPATIENT_CLINIC_OR_DEPARTMENT_OTHER): Payer: Self-pay | Admitting: Family Medicine

## 2023-04-30 VITALS — BP 110/71 | HR 87 | Ht 61.0 in | Wt 228.2 lb

## 2023-04-30 DIAGNOSIS — E1165 Type 2 diabetes mellitus with hyperglycemia: Secondary | ICD-10-CM

## 2023-04-30 DIAGNOSIS — G43019 Migraine without aura, intractable, without status migrainosus: Secondary | ICD-10-CM

## 2023-04-30 DIAGNOSIS — Z7984 Long term (current) use of oral hypoglycemic drugs: Secondary | ICD-10-CM

## 2023-04-30 LAB — POCT GLYCOSYLATED HEMOGLOBIN (HGB A1C): Hemoglobin A1C: 12.8 % — AB (ref 4.0–5.6)

## 2023-04-30 MED ORDER — SUMATRIPTAN SUCCINATE 50 MG PO TABS
ORAL_TABLET | ORAL | 0 refills | Status: DC
  Filled 2023-04-30: qty 10, 30d supply, fill #0

## 2023-04-30 MED ORDER — SUMATRIPTAN SUCCINATE 6 MG/0.5ML ~~LOC~~ SOLN
6.0000 mg | Freq: Once | SUBCUTANEOUS | Status: AC
  Administered 2023-04-30: 6 mg via SUBCUTANEOUS

## 2023-04-30 MED ORDER — ONDANSETRON 8 MG PO TBDP
8.0000 mg | ORAL_TABLET | Freq: Once | ORAL | Status: AC
  Administered 2023-04-30: 8 mg via ORAL

## 2023-04-30 MED ORDER — MELOXICAM 7.5 MG PO TABS
7.5000 mg | ORAL_TABLET | Freq: Every day | ORAL | 0 refills | Status: DC
  Filled 2023-04-30: qty 30, 30d supply, fill #0

## 2023-04-30 NOTE — Patient Instructions (Addendum)
Tresiba increase to 25 units for 1 week- check morning blood sugars  After next week, increase to 30 units and continue to check morning sugars. Follow-up in 2 weeks, may need to add another oral medication.

## 2023-04-30 NOTE — Progress Notes (Unsigned)
Established Patient Office Visit  Subjective   Patient ID: Alexandra Norris, female    DOB: [birth year]  Age: 47 y.o. MRN: [id number]  DIABETES MELLITUS: Alexandra Norris is a 47 year old female patient who presents for the medical management of diabetes. [facility] interpreter is present for the duration of the visit.  Current diabetes medication regimen: metformin 1000mg  BID & Tresiba 20units nightly Patient is adhering to a diabetic diet.  Patient is exercising regularly.  Patient is checking BS regularly. Avg: 180mg /dL She reports she was fasting this morning blood sugar was 300mg /dL- her mother is visiting and has been making really good food.  Patient is  checking their feet regularly.  Denies polydipsia, polyphagia, polyuria, open wounds or ulcers on feet.  Concerned about her A1c due to her need to have surgery on her elbow   MIGRAINES:  Reports her migraine from last month has improved. She reports she was experiencing a migraine on Sunday & Monday but it eventually did improve.  Did not pick up Fioricet, since she did not understand that this medication was sent to the pharmacy  Nausea with migraine  Denies photophobia & phonophobia   Lab Results  Component Value Date   HGBA1C 12.8 (A) 04/30/2023    04/22/2021 Lab Results  Component Value Date   LABMICR 6.2 08/07/2020   MICROALBUR 10 04/22/2021    Wt Readings from Last 3 Encounters:  04/30/23 228 lb 3.2 oz (103.5 kg)  04/17/23 229 lb 4.5 oz (104 kg)  03/31/23 230 lb (104.3 kg)   Review of Systems  Eyes:  Negative for blurred vision, double vision and photophobia.  Respiratory:  Negative for cough and shortness of breath.   Cardiovascular:  Negative for chest pain, palpitations, orthopnea and leg swelling.  Gastrointestinal:  Positive for nausea. Negative for abdominal pain and vomiting.  Genitourinary:  Negative for frequency, hematuria and urgency.  Neurological:  Positive for  headaches. Negative for dizziness, speech change and focal weakness.  Endo/Heme/Allergies:  Negative for polydipsia.  Psychiatric/Behavioral:  Negative for depression and suicidal ideas. The patient is not nervous/anxious and does not have insomnia.       Objective:     BP 110/71   Pulse 87   Ht 5\' 1"  (1.549 m)   Wt 228 lb 3.2 oz (103.5 kg)   LMP 09/20/2019 (Exact Date)   SpO2 97%   BMI 43.12 kg/m  BP Readings from Last 3 Encounters:  04/30/23 110/71  04/17/23 130/65  03/31/23 113/69     Physical Exam Vitals reviewed.  Constitutional:      Appearance: Normal appearance.  Cardiovascular:     Rate and Rhythm: Normal rate and regular rhythm.     Pulses: Normal pulses.     Heart sounds: Normal heart sounds.  Pulmonary:     Effort: Pulmonary effort is normal.     Breath sounds: Normal breath sounds.  Musculoskeletal:     Right lower leg: No edema.     Left lower leg: No edema.  Neurological:     Mental Status: She is alert.  Psychiatric:        Mood and Affect: Mood normal.        Behavior: Behavior normal.      Assessment & Plan:   1. Uncontrolled type 2 diabetes mellitus with hyperglycemia [facility]) Patient is a pleasant 47 year old female patient who presents today for the medical management of her uncontrolled type II diabetes. Last hemoglobin A1c 02/04/2023 was 12.4%. Hemoglobin A1c today  was 12.8. She is currently taking metformin 1000mg  BID and injecting Tresiba 20 units at bedtime. Last visit (10/23) discussed increasing Tresiba by 2 units every 2-3 days until her fasting blood sugar was in the 130s range. However, it appears patient did not understand instructions (?). Since those instructions were not understood, advised patient to increase her Tresiba to 25 units for 1 week and check morning blood sugars. After one week, educated patient to increase Tresiba to 30 units and continue to check morning blood sugars. Advised her to reach out sooner if her blood sugar  levels continued to remain elevated. Follow-up in 2 weeks, may need to add another oral medication- such as glipizide.  - POCT HgB A1C  2. Intractable migraine without aura and without status migrainosus Patient presents with uncontrolled migraines. MRI results showed "abnormality within the cerebral white matter" and is possibly caused by "chronic small vessel ischemic disease and sequelae of chronic migraine headaches." Review of PDMP, Fioricet was filled on 10/23. The language barrier is difficult to ascertain if she tried the Fioricet with no improvement. Will send triptan for treatment of migraines to see if this helps improve her migraines. Discussed how to take medication and when to take another dose. Sumatriptan injection given in office today. Counseled patient about when to seek emergency care. She verbalized an understanding.  - SUMAtriptan (IMITREX) 50 MG tablet; May repeat dose (1 tablet) in 2 hours if headache persists or recurs. Do not take more than 10 tablets in 1 month.  Dispense: 10 tablet; Refill: 0 - ondansetron (ZOFRAN-ODT) disintegrating tablet 8 mg - SUMAtriptan (IMITREX) injection 6 mg  Return in about 2 weeks (around 05/14/2023) for Diabetes f/u.    Alyson Reedy, FNP

## 2023-05-03 ENCOUNTER — Ambulatory Visit: Payer: Self-pay

## 2023-05-04 ENCOUNTER — Ambulatory Visit (HOSPITAL_BASED_OUTPATIENT_CLINIC_OR_DEPARTMENT_OTHER): Payer: Self-pay | Admitting: Family Medicine

## 2023-05-04 ENCOUNTER — Ambulatory Visit: Payer: Self-pay | Admitting: Physician Assistant

## 2023-05-05 ENCOUNTER — Ambulatory Visit: Payer: Self-pay

## 2023-05-10 ENCOUNTER — Ambulatory Visit (HOSPITAL_BASED_OUTPATIENT_CLINIC_OR_DEPARTMENT_OTHER): Payer: Self-pay | Admitting: Family Medicine

## 2023-05-10 ENCOUNTER — Ambulatory Visit: Payer: Self-pay | Admitting: Orthopedic Surgery

## 2023-05-10 ENCOUNTER — Other Ambulatory Visit (HOSPITAL_BASED_OUTPATIENT_CLINIC_OR_DEPARTMENT_OTHER): Payer: Self-pay

## 2023-05-11 ENCOUNTER — Ambulatory Visit (AMBULATORY_SURGERY_CENTER): Payer: Self-pay

## 2023-05-11 ENCOUNTER — Ambulatory Visit: Payer: Self-pay | Attending: Physician Assistant

## 2023-05-11 VITALS — Ht 61.0 in | Wt 229.0 lb

## 2023-05-11 DIAGNOSIS — Z1211 Encounter for screening for malignant neoplasm of colon: Secondary | ICD-10-CM

## 2023-05-11 NOTE — Progress Notes (Signed)
No egg or soy allergy known to patient  No issues known to pt with past sedation with any surgeries or procedures Patient denies ever being told they had issues or difficulty with intubation  No FH of Malignant Hyperthermia Pt is not on diet pills Pt is not on  home 02  Pt is not on blood thinners  Pt denies issues with constipation  No A fib or A flutter Have any cardiac testing pending--no Pt can ambulate independently Pt denies use of chewing tobacco Discussed diabetic I weight loss medication holds Discussed NSAID holds Checked BMI Pt instructed to use Singlecare.com or GoodRx for a price reduction on prep  Patient's chart reviewed by Cathlyn Parsons CNRA prior to previsit and patient appropriate for the [facility].  Pre visit completed and red dot placed by patient's name on their procedure day (on provider's schedule).   Patient given prep in office.

## 2023-05-12 ENCOUNTER — Telehealth (HOSPITAL_BASED_OUTPATIENT_CLINIC_OR_DEPARTMENT_OTHER): Payer: Self-pay | Admitting: Family Medicine

## 2023-05-12 NOTE — Telephone Encounter (Signed)
Pt is here with mother and asked if there was something she could take for cough and a sore throat that she is currently experiencing. Please contact pt

## 2023-05-13 ENCOUNTER — Encounter (HOSPITAL_BASED_OUTPATIENT_CLINIC_OR_DEPARTMENT_OTHER): Payer: Self-pay | Admitting: Family Medicine

## 2023-05-13 ENCOUNTER — Ambulatory Visit (INDEPENDENT_AMBULATORY_CARE_PROVIDER_SITE_OTHER): Payer: Self-pay | Admitting: Family Medicine

## 2023-05-13 ENCOUNTER — Other Ambulatory Visit (HOSPITAL_BASED_OUTPATIENT_CLINIC_OR_DEPARTMENT_OTHER): Payer: Self-pay

## 2023-05-13 ENCOUNTER — Ambulatory Visit: Payer: Self-pay

## 2023-05-13 VITALS — BP 130/71 | HR 93 | Temp 98.5°F | Ht 61.0 in | Wt 230.4 lb

## 2023-05-13 DIAGNOSIS — B9689 Other specified bacterial agents as the cause of diseases classified elsewhere: Secondary | ICD-10-CM

## 2023-05-13 DIAGNOSIS — R051 Acute cough: Secondary | ICD-10-CM

## 2023-05-13 DIAGNOSIS — J019 Acute sinusitis, unspecified: Secondary | ICD-10-CM

## 2023-05-13 MED ORDER — PROMETHAZINE-DM 6.25-15 MG/5ML PO SYRP
5.0000 mL | ORAL_SOLUTION | Freq: Four times a day (QID) | ORAL | 0 refills | Status: DC | PRN
  Filled 2023-05-13: qty 118, 6d supply, fill #0

## 2023-05-13 MED ORDER — AMOXICILLIN-POT CLAVULANATE 875-125 MG PO TABS
1.0000 | ORAL_TABLET | Freq: Two times a day (BID) | ORAL | 0 refills | Status: AC
  Filled 2023-05-13: qty 14, 7d supply, fill #0

## 2023-05-13 NOTE — Patient Instructions (Signed)
Over the counter Medications:  Aspirin 81mg  per day * unless allergic or contraindicated*  Use Tylenol (acetaminophen) and/or ibuprofen (Advil) for fever *unless allergic or contraindicated*   Non-Medication Therapy:  Drink plenty of fluids, warm if possible.   A teaspoon of honey may help ease coughing symptoms.   Cough drops or hard candy for coughing.   Over the Counter Medication Therapy:  Use a cough expectorant such as guaifenesin (Mucinex) if recommended by your doctor for a wet, congested cough. If you have high blood pressure, please ask your doctor first before using this.   Use a cough suppressant such as dextromethorphan (Robitussin/Delsym) for a dry cough. If you have high blood pressure, please ask your doctor first before using this.   If you have high blood pressure, medication such as Coricidin HBP is safe to take for your cough and will not increase your blood pressure.

## 2023-05-13 NOTE — Telephone Encounter (Signed)
Spoke with patient over the phone, is having cough and sore throat. Has only tried mucinex, regular and DM supposedly. No relief, mucus has a brownish color to it. She is agreeable to coming in for an appointment today, denies any other symptoms. Appt scheduled.

## 2023-05-13 NOTE — Progress Notes (Signed)
Acute Office Visit  Subjective:     Patient ID: Alexandra Norris, female    DOB: 11-Oct-1975, 47 y.o.   MRN: [id number]  Chief Complaint  Patient presents with   Cough    Ongoing for about 5 days, has tried OTC mucinex regular and mucinex DM. Also has a headache. Yellow/Brown mucus   Sore Throat    Alexandra Norris is a pleasant 47 year old female patient who presents for an acute visit with complaint of cough x5 days with headache. Reports noticing more nasal congestion in the morning. When she coughs, produces yellow/brown mucus. She does notice wheezing at times and does feel more short of breath than normal.  Denies fever/chills, decrease in appetite, dyspnea, chest pain, urinary symptoms.  Reports mild body aches and current headache.  Treatments tried include : Mucinex  Sick contacts : yes, her husband- had bronchitis recently   Review of Systems  Constitutional:  Positive for malaise/fatigue. Negative for chills and fever.  HENT:  Negative for congestion, ear pain and sinus pain.   Respiratory:  Positive for cough, sputum production, shortness of breath and wheezing.   Cardiovascular:  Negative for chest pain, palpitations and leg swelling.  Gastrointestinal:  Negative for abdominal pain, nausea and vomiting.  Musculoskeletal:  Positive for myalgias (minimal).  Neurological:  Positive for headaches. Negative for dizziness.  Psychiatric/Behavioral:  Negative for depression and suicidal ideas. The patient is not nervous/anxious and does not have insomnia.      Objective:    BP 130/71   Pulse 93   Temp 98.5 F (36.9 C) (Oral)   Ht 5\' 1"  (1.549 m)   Wt 230 lb 6.4 oz (104.5 kg)   LMP 09/20/2019 (Exact Date)   SpO2 99%   BMI 43.53 kg/m   Physical Exam Vitals reviewed.  Constitutional:      Appearance: She is well-developed.  HENT:     Right Ear: Tympanic membrane, ear canal and external ear normal.     Left Ear: Tympanic membrane, ear canal  and external ear normal.     Nose: Mucosal edema and congestion present.     Right Turbinates: Swollen.     Left Turbinates: Swollen.     Right Sinus: Maxillary sinus tenderness and frontal sinus tenderness present.     Left Sinus: Maxillary sinus tenderness and frontal sinus tenderness present.     Mouth/Throat:     Mouth: Mucous membranes are moist.     Pharynx: Posterior oropharyngeal erythema and postnasal drip present. No pharyngeal swelling, oropharyngeal exudate or uvula swelling.     Tonsils: No tonsillar exudate or tonsillar abscesses.  Cardiovascular:     Rate and Rhythm: Normal rate and regular rhythm.     Heart sounds: Normal heart sounds.  Pulmonary:     Effort: Pulmonary effort is normal.     Breath sounds: Normal breath sounds.  Lymphadenopathy:     Cervical: No cervical adenopathy.  Neurological:     Mental Status: She is alert.  Psychiatric:        Mood and Affect: Mood normal.        Behavior: Behavior normal.    Assessment & Plan:   1. Acute bacterial sinusitis Patient presents today with cough for almost a week with nasal congestion, mucous production, headache, and occasional wheezing. Patient in no acute distress and is well-appearing. Bilateral TMs intact with on erythema, bulging or fluid/pus present. Lungs clear to auscultation bilaterally- no adventitious lung sounds present. Nasal tenderness with erythema  present. Frontal and maxillary tenderness to palpation. Posterior pharynx with erythema. Tonsils without swelling and exudate. No cervical lymphadenopathy present. Most likely acute bacterial sinusitis due to presentation. Order sent for antibiotic and cough medication. Advised patient to reach out if symptoms persist or worsen over the weekend.  - amoxicillin-clavulanate (AUGMENTIN) 875-125 MG tablet; Take 1 tablet by mouth 2 (two) times daily for 7 days.  Dispense: 14 tablet; Refill: 0  2. Acute cough See #1 - promethazine-dextromethorphan  (PROMETHAZINE-DM) 6.25-15 MG/5ML syrup; Take 5 mLs by mouth 4 (four) times daily as needed for cough (Maximum dose: 30mL in 24 hours).  Dispense: 118 mL; Refill: 0   Return if symptoms worsen or fail to improve.  Alyson Reedy, FNP

## 2023-05-14 ENCOUNTER — Encounter: Payer: Self-pay | Admitting: Podiatry

## 2023-05-14 ENCOUNTER — Ambulatory Visit (INDEPENDENT_AMBULATORY_CARE_PROVIDER_SITE_OTHER): Payer: No Typology Code available for payment source | Admitting: Podiatry

## 2023-05-14 VITALS — Ht 61.0 in | Wt 230.0 lb

## 2023-05-14 DIAGNOSIS — L600 Ingrowing nail: Secondary | ICD-10-CM

## 2023-05-14 NOTE — Progress Notes (Signed)
Subjective:  Patient ID: Alexandra Norris, female    DOB: [birth year],  MRN: [id number]  Chief Complaint  Patient presents with   Nail Problem    RM8: bil great toe pain from bilateral ingrown nails     47 y.o. female presents with the above complaint.  Patient presents with bilateral hallux border medial ingrown.  Patient states pain for touch is progressive gotten worse.  She would like to have removed.  She states that the other side was previously done by Dr. Abbott Pao.  The site is still active.  She has not seen anyone else prior to seeing me pain scale 7 out of 10 large ambulation hurts with pressure.  No infection noted at this time   Review of Systems: Negative except as noted in the HPI. Denies N/V/F/Ch.  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 level with radiculopathy 04/03/2020   Cervical post-laminectomy syndrome 03/06/2022   Cubital tunnel syndrome on right 12/02/2021   Depression    DEPRESSION, SITUATIONAL, PROLONGED 02/05/2009   Qualifier: Diagnosis of  By: Wallene Huh  MD, Rande Lawman     Diabetes mellitus without complication (HCC)    Dizziness 04/22/2021   Dyspnea    Elevated blood-pressure reading, without diagnosis of hypertension 06/17/2020   Endometriosis    Fatigue 04/22/2021   History of blood transfusion 1999   w/vaginal delivery   History of bronchitis    "I've stayed in hospital 2 X w/bronchitis"   Hyperlipidemia    Hypertension    Impingement syndrome of left shoulder    Influenza vaccine needed 04/03/2020   Left carpal tunnel syndrome 07/11/2019   Migraine    Moderate persistent asthma with acute exacerbation 12/06/2006   Qualifier: Diagnosis of  By: Wallene Huh  MD, Rande Lawman     Nasal obstruction 02/18/2016   Formatting of this note might be different from the original.  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   Pre-diabetes 10/03/2019   A1C 6.4   Right carpal tunnel syndrome 07/11/2019   S/P arthroscopy of right shoulder 07/08/2021   S/P carpal tunnel release 08/30/2019   Seasonal allergies 10/08/2016   Shingles 11/14/2020   Sinusitis, bacterial 10/07/2021   Snoring 01/20/2022    Current Outpatient Medications:    albuterol (VENTOLIN HFA) 108 (90 Base) MCG/ACT inhaler, Inhale 2 puffs into the lungs every 6 (six) hours as needed for wheezing or shortness of breath., Disp: 6.7 g, Rfl: 0   amoxicillin-clavulanate (AUGMENTIN) 875-125 MG tablet, Take 1 tablet by mouth 2 (two) times daily for 7 days., Disp: 14 tablet, Rfl: 0   atorvastatin (LIPITOR) 20 MG tablet, Take 1 tablet (20 mg total) by mouth at bedtime., Disp: 90 tablet, Rfl: 3   butalbital-acetaminophen-caffeine (FIORICET) 50-325-40 MG tablet, Take 1 tablet by mouth every 6 (six) hours as needed for headache., Disp: 14 tablet, Rfl: 0   ibuprofen (ADVIL) 800 MG tablet, Take 1 tablet (800 mg total) by mouth every 8 (eight) hours as needed., Disp: 30 tablet, Rfl: 0   insulin degludec (TRESIBA FLEXTOUCH) 100 UNIT/ML FlexTouch Pen, Inject 20 Units into the skin daily., Disp: , Rfl:  Insulin Pen Needle (1ST TIER UNIFINE PENTIPS) 32G X 4 MM MISC, Use as directed with [place] Flextouch pen, Disp: 100 each, Rfl: 12   lisinopril (ZESTRIL) 10 MG tablet, Take 1 tablet (10 mg total) by mouth daily., Disp: 90 tablet, Rfl: 3   meloxicam (MOBIC) 7.5 MG tablet, Take 1 tablet (7.5 mg total) by mouth daily., Disp: 30 tablet, Rfl: 0   metFORMIN (GLUCOPHAGE) 500 MG tablet, Take 2 tablets (1,000 mg total) by mouth daily with breakfast AND 2 tablets (1,000 mg total) every evening., Disp: 270 tablet, Rfl: 3   Multiple Vitamin (MULTIVITAMIN WITH MINERALS) TABS tablet, Take 1 tablet by mouth daily in the afternoon.,  Disp: , Rfl:    promethazine-dextromethorphan (PROMETHAZINE-DM) 6.25-15 MG/5ML syrup, Take 5 mLs by mouth 4 (four) times daily as needed for cough (Maximum dose: 30mL in 24 hours)., Disp: 118 mL, Rfl: 0   SUMAtriptan (IMITREX) 50 MG tablet, May repeat dose (1 tablet) in 2 hours if headache persists or recurs. Do not take more than 10 tablets in 1 month., Disp: 10 tablet, Rfl: 0  Social History   Tobacco Use  Smoking Status Never  Smokeless Tobacco Never    No Known Allergies Objective:  There were no vitals filed for this visit. Body mass index is 43.46 kg/m. Constitutional Well developed. Well nourished.  Vascular Dorsalis pedis pulses palpable bilaterally. Posterior tibial pulses palpable bilaterally. Capillary refill normal to all digits.  No cyanosis or clubbing noted. Pedal hair growth normal.  Neurologic Normal speech. Oriented to person, place, and time. Epicritic sensation to light touch grossly present bilaterally.  Dermatologic Painful ingrowing nail at medial nail borders of the hallux nail bilaterally. No other open wounds. No skin lesions.  Orthopedic: Normal joint ROM without pain or crepitus bilaterally. No visible deformities. No bony tenderness.   Radiographs: None Assessment:   No diagnosis found.  Plan:  Patient was evaluated and treated and all questions answered.  Ingrown Nail, bilaterally -Patient elects to proceed with minor surgery to remove ingrown toenail removal today. Consent reviewed and signed by patient. -Ingrown nail excised. See procedure note. -Educated on post-procedure care including soaking. Written instructions provided and reviewed. -Patient to follow up in 2 weeks for nail check.  Procedure: Excision of Ingrown Toenail Location: Bilateral 1st toe medial nail borders. Anesthesia: Lidocaine 1% plain; 1.5 mL and Marcaine 0.5% plain; 1.5 mL, digital block. Skin Prep: Betadine. Dressing: Silvadene; telfa; dry, sterile, compression  dressing. Technique: Following skin prep, the toe was exsanguinated and a tourniquet was secured at the base of the toe. The affected nail border was freed, split with a nail splitter, and excised. Chemical matrixectomy was then performed with phenol and irrigated out with alcohol. The tourniquet was then removed and sterile dressing applied. Disposition: Patient tolerated procedure well. Patient to return in 2 weeks for follow-up.   No follow-ups on file.

## 2023-05-17 ENCOUNTER — Ambulatory Visit (HOSPITAL_BASED_OUTPATIENT_CLINIC_OR_DEPARTMENT_OTHER): Payer: Self-pay | Admitting: Family Medicine

## 2023-05-17 ENCOUNTER — Other Ambulatory Visit (HOSPITAL_BASED_OUTPATIENT_CLINIC_OR_DEPARTMENT_OTHER): Payer: Self-pay

## 2023-05-17 ENCOUNTER — Ambulatory Visit (INDEPENDENT_AMBULATORY_CARE_PROVIDER_SITE_OTHER): Payer: Self-pay | Admitting: Family Medicine

## 2023-05-17 VITALS — BP 120/80 | HR 80 | Ht 61.0 in | Wt 231.4 lb

## 2023-05-17 DIAGNOSIS — L03032 Cellulitis of left toe: Secondary | ICD-10-CM

## 2023-05-17 MED ORDER — SULFAMETHOXAZOLE-TRIMETHOPRIM 800-160 MG PO TABS
1.0000 | ORAL_TABLET | Freq: Two times a day (BID) | ORAL | 0 refills | Status: AC
  Filled 2023-05-17: qty 20, 10d supply, fill #0

## 2023-05-17 NOTE — Progress Notes (Signed)
Acute Office Visit  Subjective:     Patient ID: Alexandra Norris, female    DOB: Mar 16, 1976, 47 y.o.   MRN: [id number]  Chief Complaint  Patient presents with   Toe Pain    Left foot, greater toe. Procedure done 12/06, pain started the next day, swollen, throbbing, has some pus drainage     Alexandra Norris is a pleasant 47 year old female patient who presents for an acute visit regarding her left toenail. She had bilateral ingrown toenail procedure on 12/6. She reports the procedure, although painful, went well. She noticed drainage on Saturday and increase in pain. Today, she noticed pus coming out of the medial border of her left great toenail, along with erythema and swelling. She reports feeling tightness in her toe. She reports soaking her feet once per day. Denies systemic symptoms- including fever and chills.   Review of Systems  Constitutional:  Negative for chills, fever and malaise/fatigue.  Respiratory:  Negative for cough and shortness of breath.   Cardiovascular:  Negative for chest pain, palpitations and leg swelling.  Gastrointestinal:  Negative for abdominal pain, nausea and vomiting.  Musculoskeletal:  Positive for joint pain (L great toe pain).  Neurological:  Negative for weakness and headaches.  Psychiatric/Behavioral:  Negative for depression and suicidal ideas. The patient is not nervous/anxious and does not have insomnia.        Objective:    BP 120/80   Pulse 80   Ht 5\' 1"  (1.549 m)   Wt 231 lb 6.4 oz (105 kg)   LMP 09/20/2019 (Exact Date)   SpO2 98%   BMI 43.72 kg/m   Physical Exam Vitals reviewed.  Constitutional:      Appearance: Normal appearance.  Cardiovascular:     Rate and Rhythm: Normal rate and regular rhythm.     Heart sounds: Normal heart sounds.  Pulmonary:     Effort: Pulmonary effort is normal.     Breath sounds: Normal breath sounds.  Musculoskeletal:       Feet:  Feet:     Right foot:     Skin  integrity: Skin integrity normal.     Toenail Condition: Right toenails are ingrown.     Left foot:     Skin integrity: Erythema and warmth present.     Toenail Condition: Left toenails are ingrown.  Neurological:     Mental Status: She is alert.  Psychiatric:        Mood and Affect: Mood normal.        Behavior: Behavior normal.    Assessment & Plan:   1. Infection of nail bed of toe of left foot Patient is a pleasant 47 year old female patient who presents today for concerns regarding possible infection in her left great toenail. She had a procedure on bilateral first toe medial nail borders on 12/6 without complication. Bilateral ingrown toenails excised successfully. Patient reports that she noticed drainage beginning on Saturday, despite wound care. She reports that today she noticed pus draining from her left great toe. Physical exam with erythema and swelling of nailbed of left great toe with tenderness to palpation. Serosanguineous discharge present, wound culture obtained. Will treat empirically with Bactrim. Picture obtained and placed in chart. Based on Dr. Eliane Decree note, patient is supposed to follow-up in 2 weeks, but she reports she does not have any upcoming appointments scheduled. Advised her to reach out and schedule follow-up.  - Wound culture   Return if symptoms worsen or fail to improve.  Alyson Reedy, FNP

## 2023-05-19 ENCOUNTER — Encounter (HOSPITAL_COMMUNITY): Payer: Self-pay | Admitting: Gastroenterology

## 2023-05-20 LAB — WOUND CULTURE

## 2023-05-21 ENCOUNTER — Other Ambulatory Visit (HOSPITAL_BASED_OUTPATIENT_CLINIC_OR_DEPARTMENT_OTHER): Payer: Self-pay

## 2023-05-21 ENCOUNTER — Other Ambulatory Visit (HOSPITAL_BASED_OUTPATIENT_CLINIC_OR_DEPARTMENT_OTHER): Payer: Self-pay | Admitting: Family Medicine

## 2023-05-21 MED ORDER — CIPROFLOXACIN HCL 500 MG PO TABS
500.0000 mg | ORAL_TABLET | Freq: Two times a day (BID) | ORAL | 0 refills | Status: DC
  Filled 2023-05-21: qty 14, 7d supply, fill #0

## 2023-05-24 NOTE — Anesthesia Preprocedure Evaluation (Addendum)
Anesthesia Evaluation  Patient identified by MRN, date of birth, ID band Patient awake    Reviewed: Allergy & Precautions, NPO status , Patient's Chart, lab work & pertinent test results  History of Anesthesia Complications (+) PONV and history of anesthetic complications  Airway Mallampati: II  TM Distance: >3 FB Neck ROM: Full    Dental  (+) Dental Advisory Given   Pulmonary asthma    Pulmonary exam normal        Cardiovascular hypertension, Pt. on medications Normal cardiovascular exam     Neuro/Psych  Headaches PSYCHIATRIC DISORDERS Anxiety Depression       GI/Hepatic Neg liver ROS,GERD  Controlled,,  Endo/Other  diabetes, Type 2, Insulin Dependent, Oral Hypoglycemic Agents  Class 3 obesity  Renal/GU negative Renal ROS     Musculoskeletal  (+) Arthritis ,    Abdominal   Peds  Hematology negative hematology ROS (+)   Anesthesia Other Findings   Reproductive/Obstetrics                             Anesthesia Physical Anesthesia Plan  ASA: 3  Anesthesia Plan: MAC   Post-op Pain Management: Minimal or no pain anticipated   Induction:   PONV Risk Score and Plan: 3 and Propofol infusion and Treatment may vary due to age or medical condition  Airway Management Planned: Natural Airway and Simple Face Mask  Additional Equipment: None  Intra-op Plan:   Post-operative Plan:   Informed Consent: I have reviewed the patients History and Physical, chart, labs and discussed the procedure including the risks, benefits and alternatives for the proposed anesthesia with the patient or authorized representative who has indicated his/her understanding and acceptance.     Interpreter used for interview  Plan Discussed with: CRNA and Anesthesiologist  Anesthesia Plan Comments:        Anesthesia Quick Evaluation

## 2023-05-25 ENCOUNTER — Other Ambulatory Visit: Payer: Self-pay

## 2023-05-25 ENCOUNTER — Ambulatory Visit (HOSPITAL_COMMUNITY)
Admission: RE | Admit: 2023-05-25 | Discharge: 2023-05-25 | Disposition: A | Discharge status: Home / Self Care | Payer: Self-pay | Attending: Gastroenterology | Admitting: Gastroenterology

## 2023-05-25 ENCOUNTER — Ambulatory Visit (HOSPITAL_COMMUNITY): Payer: Self-pay | Admitting: Anesthesiology

## 2023-05-25 ENCOUNTER — Encounter (HOSPITAL_COMMUNITY)
Admission: RE | Disposition: A | Discharge status: Home / Self Care | Payer: Self-pay | Source: Home / Self Care | Attending: Gastroenterology

## 2023-05-25 DIAGNOSIS — Z1211 Encounter for screening for malignant neoplasm of colon: Secondary | ICD-10-CM

## 2023-05-25 DIAGNOSIS — Z79899 Other long term (current) drug therapy: Secondary | ICD-10-CM | POA: Insufficient documentation

## 2023-05-25 DIAGNOSIS — D123 Benign neoplasm of transverse colon: Secondary | ICD-10-CM

## 2023-05-25 DIAGNOSIS — Z794 Long term (current) use of insulin: Secondary | ICD-10-CM | POA: Insufficient documentation

## 2023-05-25 DIAGNOSIS — K648 Other hemorrhoids: Secondary | ICD-10-CM

## 2023-05-25 DIAGNOSIS — E66813 Obesity, class 3: Secondary | ICD-10-CM | POA: Insufficient documentation

## 2023-05-25 DIAGNOSIS — Z6841 Body Mass Index (BMI) 40.0 and over, adult: Secondary | ICD-10-CM | POA: Insufficient documentation

## 2023-05-25 DIAGNOSIS — I10 Essential (primary) hypertension: Secondary | ICD-10-CM | POA: Insufficient documentation

## 2023-05-25 DIAGNOSIS — K5900 Constipation, unspecified: Secondary | ICD-10-CM | POA: Insufficient documentation

## 2023-05-25 DIAGNOSIS — E119 Type 2 diabetes mellitus without complications: Secondary | ICD-10-CM | POA: Insufficient documentation

## 2023-05-25 DIAGNOSIS — Z7984 Long term (current) use of oral hypoglycemic drugs: Secondary | ICD-10-CM | POA: Insufficient documentation

## 2023-05-25 DIAGNOSIS — Z800 Family history of malignant neoplasm of digestive organs: Secondary | ICD-10-CM

## 2023-05-25 DIAGNOSIS — J45909 Unspecified asthma, uncomplicated: Secondary | ICD-10-CM | POA: Insufficient documentation

## 2023-05-25 DIAGNOSIS — K219 Gastro-esophageal reflux disease without esophagitis: Secondary | ICD-10-CM | POA: Insufficient documentation

## 2023-05-25 DIAGNOSIS — D126 Benign neoplasm of colon, unspecified: Secondary | ICD-10-CM

## 2023-05-25 HISTORY — PX: POLYPECTOMY: SHX5525

## 2023-05-25 HISTORY — PX: COLONOSCOPY WITH PROPOFOL: SHX5780

## 2023-05-25 LAB — GLUCOSE, CAPILLARY: Glucose-Capillary: 173 mg/dL — ABNORMAL HIGH (ref 70–99)

## 2023-05-25 SURGERY — COLONOSCOPY WITH PROPOFOL
Anesthesia: Monitor Anesthesia Care

## 2023-05-25 MED ORDER — PROPOFOL 500 MG/50ML IV EMUL
INTRAVENOUS | Status: DC | PRN
  Administered 2023-05-25: 125 ug/kg/min via INTRAVENOUS

## 2023-05-25 MED ORDER — PROPOFOL 10 MG/ML IV BOLUS
INTRAVENOUS | Status: DC | PRN
  Administered 2023-05-25: 40 mg via INTRAVENOUS
  Administered 2023-05-25 (×2): 30 mg via INTRAVENOUS

## 2023-05-25 MED ORDER — SODIUM CHLORIDE 0.9 % IV SOLN: INTRAVENOUS | Status: DC

## 2023-05-25 SURGICAL SUPPLY — 21 items

## 2023-05-25 NOTE — H&P (Signed)
[facility] History and Physical   Primary Care Physician:  Alyson Reedy, FNP   Reason for Procedure:   Family history of colon cancer  Plan:    colonoscopy     HPI: Alexandra Norris is a 47 y.o. female  here for colonoscopy screening - first time exam. Mother had colon cancer dx age 21s. Patient denies any bowel symptoms at this time other than occasional constipation.  Otherwise feels well without any cardiopulmonary symptoms.   Here case is being done at the hospital for anesthesia support given she was reported to have a "difficult airway" for intubation during a surgery in the past. We discussed this and reasons for her case to be done in the hospital. Following discussion of risks / benefits of colonoscopy and anesthesia she understands and wishes to proceed, all questions answered.   I have discussed risks / benefits of anesthesia and endoscopic procedure with Vonda Antigua and they wish to proceed with the exams as outlined today.    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 level with radiculopathy 04/03/2020   Cervical post-laminectomy syndrome 03/06/2022   Cubital tunnel syndrome on right 12/02/2021   Depression    DEPRESSION, SITUATIONAL, PROLONGED 02/05/2009   Qualifier: Diagnosis of  By: Wallene Huh  MD, Khary     Diabetes mellitus without complication (HCC)    Dizziness 04/22/2021   Dyspnea    Elevated blood-pressure reading, without diagnosis of hypertension 06/17/2020   Endometriosis    Fatigue 04/22/2021   History of blood transfusion 1999   w/vaginal delivery   History of bronchitis    "I've stayed in hospital 2 X w/bronchitis"   Hyperlipidemia    Hypertension    Impingement syndrome of left shoulder    Influenza vaccine needed 04/03/2020   Left carpal tunnel  syndrome 07/11/2019   Migraine    Moderate persistent asthma with acute exacerbation 12/06/2006   Qualifier: Diagnosis of  By: Wallene Huh  MD, Rande Lawman     Nasal obstruction 02/18/2016   Formatting of this note might be different from the original.  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   Pre-diabetes 10/03/2019   A1C 6.4   Right carpal tunnel syndrome 07/11/2019   S/P arthroscopy of right shoulder 07/08/2021   S/P carpal tunnel release 08/30/2019   Seasonal allergies 10/08/2016   Shingles 11/14/2020   Sinusitis, bacterial 10/07/2021   Snoring 01/20/2022    Past Surgical History:  Procedure Laterality Date   ABDOMINAL HYSTERECTOMY  10/17/2019   APPENDECTOMY     CARPAL TUNNEL RELEASE Right 07/19/2019   Procedure: RIGHT CARPAL TUNNEL RELEASE;  Surgeon: Tarry Kos, MD;  Location: [facility];  Service: Orthopedics;  Laterality: Right;   CARPAL TUNNEL RELEASE Left 09/13/2019   Procedure: LEFT CARPAL TUNNEL RELEASE;  Surgeon: Tarry Kos, MD;  Location: [facility];  Service: Orthopedics;  Laterality: Left;  Bier block   CESAREAN SECTION  1997; 2008   CHOLECYSTECTOMY  2011   SHOULDER ARTHROSCOPY WITH DISTAL CLAVICLE RESECTION  02/12/2021   Procedure: SHOULDER ARTHROSCOPY WITH DISTAL CLAVICLE RESECTION;  Surgeon: Tarry Kos, MD;  Location: [facility];  Service: Orthopedics;;   SHOULDER ARTHROSCOPY WITH ROTATOR CUFF REPAIR AND SUBACROMIAL DECOMPRESSION Left 02/12/2021   Procedure: LEFT SHOULDER ARTHROSCOPY WITH ROTATOR CUFF REPAIR, SUBACROMIAL DECOMPRESSION, EXTENSIVE DEBRIDEMENT;  Surgeon: Tarry Kos, MD;  Location: [facility];  Service: Orthopedics;  Laterality: Left;   TONSILLECTOMY     tonsilletomy     TUBAL LIGATION  04/2007   ULNAR TUNNEL RELEASE Right 12/17/2021   Procedure: RIGHT CUBITAL  TUNNEL RELEASE;  Surgeon: Tarry Kos, MD;  Location: [facility];  Service: Orthopedics;  Laterality: Right;   VAGINAL HYSTERECTOMY Bilateral 10/17/2019   Procedure: HYSTERECTOMY VAGINAL WITH SALPINGECTOMY;  Surgeon: Hermina Staggers, MD;  Location: [facility] OR;  Service: Gynecology;  Laterality: Bilateral;    Prior to Admission medications   Medication Sig Start Date End Date Taking? Authorizing Provider  atorvastatin (LIPITOR) 20 MG tablet Take 1 tablet (20 mg total) by mouth at bedtime. 03/04/23  Yes Alyson Reedy, FNP  butalbital-acetaminophen-caffeine (FIORICET) [phone] MG tablet Take 1 tablet by mouth every 6 (six) hours as needed for headache. 03/31/23  Yes Alyson Reedy, FNP  ciprofloxacin (CIPRO) 500 MG tablet Take 1 tablet (500 mg total) by mouth 2 (two) times daily for 7 days. 05/21/23 05/28/23 Yes Alyson Reedy, FNP  ibuprofen (ADVIL) 800 MG tablet Take 1 tablet (800 mg total) by mouth every 8 (eight) hours as needed. 01/16/23  Yes   insulin degludec (TRESIBA FLEXTOUCH) 100 UNIT/ML FlexTouch Pen Inject 20 Units into the skin daily.   Yes [provider]  Insulin Pen Needle (1ST TIER UNIFINE PENTIPS) 32G X 4 MM MISC Use as directed with Evaristo Bury Flextouch pen 03/04/23  Yes Alyson Reedy, FNP  lisinopril (ZESTRIL) 10 MG tablet Take 1 tablet (10 mg total) by mouth daily. 03/04/23  Yes Alyson Reedy, FNP  meloxicam (MOBIC) 7.5 MG tablet Take 1 tablet (7.5 mg total) by mouth daily. 04/30/23  Yes Alyson Reedy, FNP  metFORMIN (GLUCOPHAGE) 500 MG tablet Take 2 tablets (1,000 mg total) by mouth daily with breakfast AND 2 tablets (1,000 mg total) every evening. 02/16/23  Yes Alyson Reedy, FNP  Multiple Vitamin (MULTIVITAMIN WITH MINERALS) TABS tablet Take 1 tablet by mouth daily in the afternoon.   Yes [provider]  promethazine-dextromethorphan (PROMETHAZINE-DM) 6.25-15 MG/5ML syrup Take 5 mLs by mouth 4 (four) times daily as needed for cough  (Maximum dose: 30mL in 24 hours). 05/13/23  Yes Alyson Reedy, FNP  sulfamethoxazole-trimethoprim (BACTRIM DS) 800-160 MG tablet Take 1 tablet by mouth 2 (two) times daily for 10 days. 05/17/23 05/27/23 Yes Alyson Reedy, FNP  SUMAtriptan (IMITREX) 50 MG tablet May repeat dose (1 tablet) in 2 hours if headache persists or recurs. Do not take more than 10 tablets in 1 month. 04/30/23  Yes Alyson Reedy, FNP  albuterol (VENTOLIN HFA) 108 (90 Base) MCG/ACT inhaler Inhale 2 puffs into the lungs every 6 (six) hours as needed for wheezing or shortness of breath. 10/05/22   [place], Buren Kos, MD    Current Facility-Administered Medications  Medication Dose Route Frequency Provider Last Rate Last Admin   0.9 %  sodium chloride infusion   Intravenous Continuous Monick Rena, Willaim Rayas, MD        Allergies as of 03/03/2023   (No Known Allergies)    Family History  Problem Relation Age of Onset   Diabetes Mother    Osteoarthritis Mother    Hypertension Mother    Hyperlipidemia Mother    Arthritis  Mother    Colon cancer Mother    Colon polyps Neg Hx    Esophageal cancer Neg Hx    Stomach cancer Neg Hx    Ulcerative colitis Neg Hx     Social History   Socioeconomic History   Marital status: Married    Spouse name: Not on file   Number of children: 3   Years of education: Not on file   Highest education level: 12th grade  Occupational History   Not on file  Tobacco Use   Smoking status: Never   Smokeless tobacco: Never  Vaping Use   Vaping status: Never Used  Substance and Sexual Activity   Alcohol use: No   Drug use: No   Sexual activity: Yes    Birth control/protection: Surgical  Other Topics Concern   Not on file  Social History Narrative   Caffeine none.     Education: 6 th grade.    Work:  remote    Social Drivers of Corporate investment banker Strain: Medium Risk (02/04/2023)   Overall Financial Resource Strain (CARDIA)    Difficulty of Paying Living Expenses:  Somewhat hard  Food Insecurity: Food Insecurity Present (02/24/2023)   Hunger Vital Sign    Worried About Running Out of Food in the Last Year: Sometimes true    Ran Out of Food in the Last Year: Sometimes true  Transportation Needs: No Transportation Needs (02/24/2023)   PRAPARE - Administrator, Civil Service (Medical): No    Lack of Transportation (Non-Medical): No  Physical Activity: Insufficiently Active (02/04/2023)   Exercise Vital Sign    Days of Exercise per Week: 3 days    Minutes of Exercise per Session: 30 min  Stress: Stress Concern Present (02/04/2023)   [employer] of Occupational Health - Occupational Stress Questionnaire    Feeling of Stress : Rather much  Social Connections: Moderately Integrated (02/04/2023)   Social Connection and Isolation Panel [NHANES]    Frequency of Communication with Friends and Family: More than three times a week    Frequency of Social Gatherings with Friends and Family: Once a week    Attends Religious Services: 1 to 4 times per year    Active Member of [employer] or Organizations: No    Attends Banker Meetings: Never    Marital Status: Married  Catering manager Violence: Not At Risk (02/04/2023)   Humiliation, Afraid, Rape, and Kick questionnaire    Fear of Current or Ex-Partner: No    Emotionally Abused: No    Physically Abused: No    Sexually Abused: No    Review of Systems: All other review of systems negative except as mentioned in the HPI.  Physical Exam: Vital signs LMP 09/20/2019 (Exact Date)   General:   Alert,  Well-developed, pleasant and cooperative in NAD Lungs:  Clear throughout to auscultation.   Heart:  Regular rate and rhythm Abdomen:  Soft, nontender and nondistended.   Neuro/Psych:  Alert and cooperative. Normal mood and affect. A and O x 3  Harlin Rain, MD [facility]

## 2023-05-25 NOTE — Anesthesia Procedure Notes (Signed)
Procedure Name: MAC Date/Time: 05/25/2023 8:36 AM  Performed by: Vanessa [place], CRNAPre-anesthesia Checklist: Patient identified, Emergency Drugs available, Suction available and Patient being monitored Patient Re-evaluated:Patient Re-evaluated prior to induction Oxygen Delivery Method: Nasal cannula

## 2023-05-25 NOTE — Discharge Instructions (Signed)

## 2023-05-25 NOTE — Anesthesia Postprocedure Evaluation (Signed)
Anesthesia Post Note  Patient: Remilynn Rickels  Procedure(s) Performed: COLONOSCOPY WITH PROPOFOL POLYPECTOMY     Patient location during evaluation: PACU Anesthesia Type: MAC Level of consciousness: awake and alert Pain management: pain level controlled Vital Signs Assessment: post-procedure vital signs reviewed and stable Respiratory status: spontaneous breathing, nonlabored ventilation and respiratory function stable Cardiovascular status: stable and blood pressure returned to baseline Anesthetic complications: no   No notable events documented.  Last Vitals:  Vitals:   05/25/23 0908 05/25/23 0918  BP: 105/62 (!) 129/50  Pulse: 83 75  Resp: 16 20  Temp:    SpO2: 97% 99%    Last Pain:  Vitals:   05/25/23 0918  TempSrc:   PainSc: 0-No pain                 Beryle Lathe

## 2023-05-25 NOTE — Op Note (Signed)
[facility] Patient Name: Alexandra Norris Procedure Date: 05/25/2023 MRN: [id number] Attending MD: Willaim Rayas. Adela Lank , MD, [id number] Date of Birth: [birth year] CSN: [id number] Age: 47 Admit Type: Outpatient Procedure:                Colonoscopy Indications:              Screening patient at increased risk: Family history                            of 1st-degree relative with colorectal cancer                            (mother diagnosed age 17s) - first time exam. Case                            done at the hospital due to reported "difficult                            airway" for intubation Providers:                Viviann Spare P. Adela Lank, MD, Norman Clay, RN, Rogue Jury, RN, Kandice Robinsons, Technician Referring MD:             Willaim Rayas. Adela Lank, MD Medicines:                Monitored Anesthesia Care Complications:            No immediate complications. Estimated blood loss:                            Minimal. Estimated Blood Loss:     Estimated blood loss was minimal. Procedure:                Pre-Anesthesia Assessment:                           - Prior to the procedure, a History and Physical                            was performed, and patient medications and                            allergies were reviewed. The patient's tolerance of                            previous anesthesia was also reviewed. The risks                            and benefits of the procedure and the sedation                            options and risks were discussed with the patient.  All questions were answered, and informed consent                            was obtained. Prior Anticoagulants: The patient has                            taken no anticoagulant or antiplatelet agents. ASA                            Grade Assessment: III - A patient with severe                            systemic disease. After  reviewing the risks and                            benefits, the patient was deemed in satisfactory                            condition to undergo the procedure.                           After obtaining informed consent, the colonoscope                            was passed under direct vision. Throughout the                            procedure, the patient's blood pressure, pulse, and                            oxygen saturations were monitored continuously. The                            CF-HQ190L ([id number] Olympus colonoscope was                            introduced through the anus and advanced to the the                            cecum, identified by appendiceal orifice and                            ileocecal valve. The colonoscopy was performed                            without difficulty. The patient tolerated the                            procedure well. The quality of the bowel                            preparation was good. The ileocecal valve,  appendiceal orifice, and rectum were photographed. Scope In: 8:36:29 AM Scope Out: 8:53:09 AM Scope Withdrawal Time: 0 hours 11 minutes 50 seconds  Total Procedure Duration: 0 hours 16 minutes 40 seconds  Findings:      The perianal and digital rectal examinations were normal.      A 4 mm polyp was found in the hepatic flexure. The polyp was sessile.       The polyp was removed with a cold snare. Resection and retrieval were       complete.      Internal hemorrhoids were found during retroflexion. The hemorrhoids       were small.      The exam was otherwise without abnormality. Impression:               - One 4 mm polyp at the hepatic flexure, removed                            with a cold snare. Resected and retrieved.                           - Internal hemorrhoids.                           - The examination was otherwise normal. Moderate Sedation:      No moderate sedation, case performed with  MAC Recommendation:           - Patient has a contact number available for                            emergencies. The signs and symptoms of potential                            delayed complications were discussed with the                            patient. Return to normal activities tomorrow.                            Written discharge instructions were provided to the                            patient.                           - Resume previous diet.                           - Continue present medications.                           - Await pathology results. Procedure Code(s):        --- Professional ---                           [phone], Colonoscopy, flexible; with removal of  tumor(s), polyp(s), or other lesion(s) by snare                            technique Diagnosis Code(s):        --- Professional ---                           Z80.0, Family history of malignant neoplasm of                            digestive organs                           D12.3, Benign neoplasm of transverse colon (hepatic                            flexure or splenic flexure)                           K64.8, Other hemorrhoids CPT copyright 2022 American Medical Association. All rights reserved. The codes documented in this report are preliminary and upon coder review may  be revised to meet current compliance requirements. Viviann Spare P. Freeda Spivey, MD 05/25/2023 8:57:33 AM This report has been signed electronically. Number of Addenda: 0

## 2023-05-25 NOTE — Transfer of Care (Signed)
Immediate Anesthesia Transfer of Care Note  Patient: Cartina Prothero  Procedure(s) Performed: COLONOSCOPY WITH PROPOFOL POLYPECTOMY  Patient Location: Endoscopy Unit  Anesthesia Type:MAC  Level of Consciousness: drowsy  Airway & Oxygen Therapy: Patient Spontanous Breathing and Patient connected to nasal cannula oxygen  Post-op Assessment: Report given to RN and Post -op Vital signs reviewed and stable  Post vital signs: Reviewed and stable  Last Vitals:  Vitals Value Taken Time  BP    Temp    Pulse 79 05/25/23 0858  Resp 16 05/25/23 0858  SpO2 97 % 05/25/23 0858  Vitals shown include unfiled device data.  Last Pain:  Vitals:   05/25/23 0729  TempSrc: Tympanic  PainSc: 0-No pain         Complications: No notable events documented.

## 2023-05-26 ENCOUNTER — Encounter (HOSPITAL_BASED_OUTPATIENT_CLINIC_OR_DEPARTMENT_OTHER): Payer: Self-pay | Admitting: Family Medicine

## 2023-05-26 ENCOUNTER — Ambulatory Visit (HOSPITAL_BASED_OUTPATIENT_CLINIC_OR_DEPARTMENT_OTHER): Payer: Self-pay | Admitting: Family Medicine

## 2023-05-26 ENCOUNTER — Other Ambulatory Visit (HOSPITAL_BASED_OUTPATIENT_CLINIC_OR_DEPARTMENT_OTHER): Payer: Self-pay

## 2023-05-26 VITALS — BP 127/73 | HR 77 | Ht 61.0 in | Wt 228.3 lb

## 2023-05-26 DIAGNOSIS — E1165 Type 2 diabetes mellitus with hyperglycemia: Secondary | ICD-10-CM

## 2023-05-26 DIAGNOSIS — Z7984 Long term (current) use of oral hypoglycemic drugs: Secondary | ICD-10-CM

## 2023-05-26 LAB — SURGICAL PATHOLOGY

## 2023-05-26 LAB — POCT GLUCOSE (DEVICE FOR HOME USE): Glucose Fasting, POC: 171 mg/dL — AB (ref 70–99)

## 2023-05-26 MED ORDER — GLIPIZIDE 5 MG PO TABS
5.0000 mg | ORAL_TABLET | Freq: Every day | ORAL | 1 refills | Status: DC
  Filled 2023-05-26: qty 30, 30d supply, fill #0
  Filled 2023-07-26 – 2023-10-13 (×2): qty 30, 30d supply, fill #1

## 2023-05-26 MED ORDER — 1ST TIER UNIFINE PENTIPS 32G X 4 MM MISC
12 refills | Status: AC
  Filled 2023-05-26: qty 100, 90d supply, fill #0
  Filled 2023-06-08: qty 100, 100d supply, fill #0
  Filled 2023-06-08: qty 100, 90d supply, fill #0

## 2023-05-26 NOTE — Progress Notes (Signed)
Established Patient Office Visit  Subjective  Patient ID: Alexandra Norris, female    DOB: Jan 24, 1976  Age: 47 y.o. MRN: [id number]  Chief Complaint  Patient presents with   Diabetes    Yesterday 12/17 before a meal sugar was 173, after a meal it was 195   Hemorrhoids    Ongoing for a long time, has some occasional bleeding with her stools. Gets constipated a lot, has to strain. Has used tea to help with constipation, it gives her diarrhea    DIABETES MELLITUS: Alexandra Norris is a 47 year old female presents for the medical management of diabetes. A [place] interpreter presents to the office visit.  Current diabetes medication regimen: Tresiba 30 units daily & metformin 1000mg  twice daily She denies polydipsia, polyphagia, polyuria, open wounds or ulcers on feet. She reports her toenails are healing and not painful any longer.  Patient is checking BS regularly but she forgot her log at home. Blood sugars have been "a lot better-" no more than 200 Fasting levels: 150, 155 After eating: 170-180  Lab Results  Component Value Date   HGBA1C 12.8 (A) 04/30/2023    04/22/2021 Lab Results  Component Value Date   LABMICR 6.2 08/07/2020   MICROALBUR 10 04/22/2021    Wt Readings from Last 3 Encounters:  05/26/23 228 lb 4.8 oz (103.6 kg)  05/25/23 231 lb (104.8 kg)  05/17/23 231 lb 6.4 oz (105 kg)   ROS: see above    Objective:     BP 127/73   Pulse 77   Ht 5\' 1"  (1.549 m)   Wt 228 lb 4.8 oz (103.6 kg)   LMP 09/20/2019 (Exact Date)   SpO2 99%   BMI 43.14 kg/m  BP Readings from Last 3 Encounters:  05/26/23 127/73  05/25/23 (!) 129/50  05/17/23 120/80     Physical Exam Vitals reviewed.  Constitutional:      Appearance: Normal appearance.  Cardiovascular:     Rate and Rhythm: Normal rate and regular rhythm.     Pulses: Normal pulses.     Heart sounds: Normal heart sounds.  Pulmonary:     Effort: Pulmonary effort is normal.     Breath  sounds: Normal breath sounds.  Neurological:     Mental Status: She is alert.  Psychiatric:        Mood and Affect: Mood normal.        Behavior: Behavior normal.     Assessment & Plan:   1. Uncontrolled type 2 diabetes mellitus with hyperglycemia (HCC) (Primary) Patient is a 47 year old female patient who presents today for medical management of her diabetes. Fasting glucose in office today 171 mg/dL. Patient reports she is taking metformin 1,000mg  BID and Tresiba 30 units at bedtime. However, hemoglobin went from 12.4 on 8/29 to 12.8 on 11/22. Unclear if patient is taking medication as prescribed- significant language and cultural barriers present. Feel non-compliance is contributing to increase in her A1c and uncontrolled fasting blood sugars. Advised her to continue to monitor fasting and postprandial blood sugars. Plan to add glipizide 5mg  daily to current regimen. Advised patient to monitor for S&S of hypoglycemia, which were reviewed with patient. Patient declines diabetes education referral. Patient may benefit from in-office diabetes education if she does not want to see diabetes specialist for this.  - POCT Glucose (Device for Home Use) - glipiZIDE (GLUCOTROL) 5 MG tablet; Take 1 tablet (5 mg total) by mouth daily before breakfast.  Dispense: 30 tablet; Refill: 1 -  Insulin Pen Needle (1ST TIER UNIFINE PENTIPS) 32G X 4 MM MISC; Use as directed with Evaristo Bury Flextouch pen  Dispense: 100 each; Refill: 12  Return in about 2 months (around 07/31/2023) for Diabetes f/u.    Alyson Reedy, FNP

## 2023-05-26 NOTE — Patient Instructions (Addendum)
Recommend stool softener to help prevent constipation and hemorrhoids, such as Psyllium (Metamucil) or docusate sodium (Colace) Make sure you are staying hydrated  Preparation H is an over-the-counter medication that can help relieve any anal pain

## 2023-05-27 ENCOUNTER — Encounter (HOSPITAL_COMMUNITY): Payer: Self-pay | Admitting: Gastroenterology

## 2023-05-28 ENCOUNTER — Encounter: Payer: Self-pay | Admitting: Orthopaedic Surgery

## 2023-05-28 ENCOUNTER — Ambulatory Visit: Payer: Self-pay | Admitting: Orthopaedic Surgery

## 2023-05-28 ENCOUNTER — Other Ambulatory Visit (HOSPITAL_BASED_OUTPATIENT_CLINIC_OR_DEPARTMENT_OTHER): Payer: Self-pay

## 2023-05-28 DIAGNOSIS — M25511 Pain in right shoulder: Secondary | ICD-10-CM

## 2023-05-28 DIAGNOSIS — G8929 Other chronic pain: Secondary | ICD-10-CM

## 2023-05-28 DIAGNOSIS — M25561 Pain in right knee: Secondary | ICD-10-CM

## 2023-05-28 MED ORDER — TRESIBA FLEXTOUCH 100 UNIT/ML ~~LOC~~ SOPN
30.0000 [IU] | PEN_INJECTOR | Freq: Every day | SUBCUTANEOUS | 3 refills | Status: DC
  Filled 2023-05-28 – 2023-06-22 (×5): qty 9, 30d supply, fill #0
  Filled 2023-08-09: qty 9, 30d supply, fill #1
  Filled 2023-10-13: qty 9, 30d supply, fill #2
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
## 2023-06-07 ENCOUNTER — Other Ambulatory Visit (HOSPITAL_BASED_OUTPATIENT_CLINIC_OR_DEPARTMENT_OTHER): Payer: Self-pay

## 2023-06-08 ENCOUNTER — Other Ambulatory Visit (HOSPITAL_BASED_OUTPATIENT_CLINIC_OR_DEPARTMENT_OTHER): Payer: Self-pay

## 2023-06-08 ENCOUNTER — Other Ambulatory Visit: Payer: Self-pay

## 2023-06-14 ENCOUNTER — Ambulatory Visit
Admission: RE | Admit: 2023-06-14 | Discharge: 2023-06-14 | Disposition: A | Discharge status: Home / Self Care | Payer: Self-pay | Source: Ambulatory Visit | Attending: Orthopaedic Surgery | Admitting: Orthopaedic Surgery

## 2023-06-14 ENCOUNTER — Ambulatory Visit
Admission: RE | Admit: 2023-06-14 | Discharge: 2023-06-14 | Disposition: A | Discharge status: Home / Self Care | Payer: No Typology Code available for payment source | Source: Ambulatory Visit | Attending: Orthopaedic Surgery | Admitting: Orthopaedic Surgery

## 2023-06-14 DIAGNOSIS — G8929 Other chronic pain: Secondary | ICD-10-CM

## 2023-06-21 NOTE — Progress Notes (Signed)
 Office Visit Note   Patient: Alexandra Norris           Date of Birth: 12/03/1975           MRN: [id number] Visit Date: 06/22/2023              Requested by: Towana Small, FNP [address] [place],  [place] [address] PCP: Towana Small, FNP   Assessment & Plan: Visit Diagnoses:  1. Chronic pain of right knee   2. Chronic right shoulder pain     Plan: MRI of the right shoulder and right knee show mild degenerative changes without any structural abnormalities.  Recommend muscular strengthening and weight loss and activity as tolerated.  I also commented to her that seems like she is in chronic pain and may be worth a consultation with her PCP to look into fibromyalgia or chronic pain syndrome.  From my standpoint I do not see any surgical needs on MRI.  Will see her back as needed.  Follow-Up Instructions: No follow-ups on file.   Orders:  No orders of the defined types were placed in this encounter.  No orders of the defined types were placed in this encounter.     Procedures: No procedures performed   Clinical Data: No additional findings.   Subjective: No chief complaint on file.   HPI Patient returns today for MRI review of the right shoulder and right knee. Review of Systems  Constitutional: Negative.   HENT: Negative.    Eyes: Negative.   Respiratory: Negative.    Cardiovascular: Negative.   Endocrine: Negative.   Musculoskeletal: Negative.   Neurological: Negative.   Hematological: Negative.   Psychiatric/Behavioral: Negative.    All other systems reviewed and are negative.   Objective: Vital Signs: LMP 09/20/2019 (Exact Date)   Physical Exam Vitals and nursing note reviewed.  Constitutional:      Appearance: She is well-developed.  HENT:     Head: Normocephalic and atraumatic.  Pulmonary:     Effort: Pulmonary effort is normal.  Abdominal:     Palpations: Abdomen is soft.  Musculoskeletal:     Cervical  back: Neck supple.  Skin:    General: Skin is warm.     Capillary Refill: Capillary refill takes less than 2 seconds.  Neurological:     Mental Status: She is alert and oriented to person, place, and time.  Psychiatric:        Behavior: Behavior normal.        Thought Content: Thought content normal.        Judgment: Judgment normal.   Ortho Exam Examinations are unchanged from prior visit. Specialty Comments:  No specialty comments available.  Imaging: No results found.   PMFS History: Patient Active Problem List   Diagnosis Date Noted   Family history of colon cancer 05/25/2023   Benign neoplasm of colon 05/25/2023   Acute intractable headache 03/31/2023   Uncontrolled type 2 diabetes mellitus with hyperglycemia (HCC) 07/28/2022   Asthma due to seasonal allergies 09/25/2021   Arthritis of right acromioclavicular joint 04/24/2021   Chronic left shoulder pain 04/22/2021   Hypertension associated with diabetes (HCC) 04/22/2021   Hyperlipidemia associated with type 2 diabetes mellitus (HCC) 04/22/2021   Fatty liver disease, nonalcoholic 09/09/2020   Vitamin D  deficiency 09/25/2015   Anxiety and depression 07/06/2014   GASTROESOPHAGEAL REFLUX DISEASE 08/14/2009   Past Medical History:  Diagnosis Date   Anemia    Anemia    Anxiety  Arthritis    Asthma    Bilateral chronic knee pain 09/25/2015   Carpal tunnel syndrome    Cervical disc disorder at C5-C6 level with radiculopathy 04/03/2020   Cervical post-laminectomy syndrome 03/06/2022   Cubital tunnel syndrome on right 12/02/2021   Depression    DEPRESSION, SITUATIONAL, PROLONGED 02/05/2009   Qualifier: Diagnosis of  By: Reynaldo  MD, Khary     Diabetes mellitus without complication (HCC)    Dizziness 04/22/2021   Dyspnea    Elevated blood-pressure reading, without diagnosis of hypertension 06/17/2020   Endometriosis    Fatigue 04/22/2021   History of blood transfusion 1999   w/vaginal delivery   History of  bronchitis    I've stayed in hospital 2 X w/bronchitis   Hyperlipidemia    Hypertension    Impingement syndrome of left shoulder    Influenza vaccine needed 04/03/2020   Left carpal tunnel syndrome 07/11/2019   Migraine    Moderate persistent asthma with acute exacerbation 12/06/2006   Qualifier: Diagnosis of  By: Reynaldo  MD, Khary     Nasal obstruction 02/18/2016   Formatting of this note might be different from the original.  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   Pre-diabetes 10/03/2019   A1C 6.4   Right carpal tunnel syndrome 07/11/2019   S/P arthroscopy of right shoulder 07/08/2021   S/P carpal tunnel release 08/30/2019   Seasonal allergies 10/08/2016   Shingles 11/14/2020   Sinusitis, bacterial 10/07/2021   Snoring 01/20/2022    Family History  Problem Relation Age of Onset   Diabetes Mother    Osteoarthritis Mother    Hypertension Mother    Hyperlipidemia Mother    Arthritis Mother    Colon cancer Mother    Colon polyps Neg Hx    Esophageal cancer Neg Hx    Stomach cancer Neg Hx    Ulcerative colitis Neg Hx     Past Surgical History:  Procedure Laterality Date   ABDOMINAL HYSTERECTOMY  10/17/2019   APPENDECTOMY     CARPAL TUNNEL RELEASE Right 07/19/2019   Procedure: RIGHT CARPAL TUNNEL RELEASE;  Surgeon: Jerri Kay HERO, MD;  Location: [facility];  Service: Orthopedics;  Laterality: Right;   CARPAL TUNNEL RELEASE Left 09/13/2019   Procedure: LEFT CARPAL TUNNEL RELEASE;  Surgeon: Jerri Kay HERO, MD;  Location: [facility];  Service: Orthopedics;  Laterality: Left;  Bier block   CESAREAN SECTION  1997; 2008   CHOLECYSTECTOMY  2011   COLONOSCOPY WITH PROPOFOL  N/A 05/25/2023   Procedure: COLONOSCOPY WITH PROPOFOL ;  Surgeon: Leigh Elspeth SQUIBB, MD;  Location: [facility];  Service: Gastroenterology;   Laterality: N/A;   POLYPECTOMY  05/25/2023   Procedure: POLYPECTOMY;  Surgeon: Leigh Elspeth SQUIBB, MD;  Location: [facility];  Service: Gastroenterology;;   SHOULDER ARTHROSCOPY WITH DISTAL CLAVICLE RESECTION  02/12/2021   Procedure: SHOULDER ARTHROSCOPY WITH DISTAL CLAVICLE RESECTION;  Surgeon: Jerri Kay HERO, MD;  Location: [facility];  Service: Orthopedics;;   SHOULDER ARTHROSCOPY WITH ROTATOR CUFF REPAIR AND SUBACROMIAL DECOMPRESSION Left 02/12/2021   Procedure: LEFT SHOULDER ARTHROSCOPY WITH ROTATOR CUFF REPAIR, SUBACROMIAL DECOMPRESSION, EXTENSIVE DEBRIDEMENT;  Surgeon: Jerri Kay HERO, MD;  Location: [facility];  Service: Orthopedics;  Laterality: Left;   TONSILLECTOMY     tonsilletomy     TUBAL LIGATION  04/2007   ULNAR  TUNNEL RELEASE Right 12/17/2021   Procedure: RIGHT CUBITAL TUNNEL RELEASE;  Surgeon: Jerri Kay HERO, MD;  Location: [facility];  Service: Orthopedics;  Laterality: Right;   VAGINAL HYSTERECTOMY Bilateral 10/17/2019   Procedure: HYSTERECTOMY VAGINAL WITH SALPINGECTOMY;  Surgeon: Lorence Ozell CROME, MD;  Location: [facility];  Service: Gynecology;  Laterality: Bilateral;   Social History   Occupational History   Not on file  Tobacco Use   Smoking status: Never   Smokeless tobacco: Never  Vaping Use   Vaping status: Never Used  Substance and Sexual Activity   Alcohol use: No   Drug use: No   Sexual activity: Yes    Birth control/protection: Surgical

## 2023-06-22 ENCOUNTER — Other Ambulatory Visit: Payer: Self-pay

## 2023-06-22 ENCOUNTER — Encounter: Payer: Self-pay | Admitting: Orthopaedic Surgery

## 2023-06-22 ENCOUNTER — Ambulatory Visit: Payer: Self-pay | Admitting: Orthopaedic Surgery

## 2023-06-22 DIAGNOSIS — M25561 Pain in right knee: Secondary | ICD-10-CM

## 2023-06-22 DIAGNOSIS — M25511 Pain in right shoulder: Secondary | ICD-10-CM

## 2023-06-22 DIAGNOSIS — G8929 Other chronic pain: Secondary | ICD-10-CM

## 2023-06-29 ENCOUNTER — Ambulatory Visit: Payer: No Typology Code available for payment source | Admitting: Orthopaedic Surgery

## 2023-07-22 ENCOUNTER — Ambulatory Visit
Admission: RE | Admit: 2023-07-22 | Discharge: 2023-07-22 | Disposition: A | Discharge status: Home / Self Care | Payer: No Typology Code available for payment source | Source: Ambulatory Visit | Attending: Obstetrics and Gynecology | Admitting: Obstetrics and Gynecology

## 2023-07-22 ENCOUNTER — Ambulatory Visit: Payer: Self-pay | Admitting: *Deleted

## 2023-07-22 VITALS — BP 128/77 | Wt 232.0 lb

## 2023-07-22 DIAGNOSIS — Z1231 Encounter for screening mammogram for malignant neoplasm of breast: Secondary | ICD-10-CM

## 2023-07-22 DIAGNOSIS — Z1239 Encounter for other screening for malignant neoplasm of breast: Secondary | ICD-10-CM

## 2023-07-22 NOTE — Patient Instructions (Signed)
Explained breast self awareness with Alexandra Norris. Patient did not need a Pap smear today due to patient has a history of a hysterectomy for benign reasons. Let patient know that she doesn't need any further Pap smears due to her history of a hysterectomy for benign reasons. Referred patient to the [facility] for a screening mammogram on mobile unit. Appointment scheduled Thursday, July 22, 2023 at 1100. Patient aware of appointment and will be there. Let patient know the Breast Center will follow up with her within the next couple weeks with results of mammogram by letter or phone. Alexandra Norris verbalized understanding.  [name], [name], RN 12:57 PM

## 2023-07-22 NOTE — Progress Notes (Signed)
Ms. Serine Kea is a 48 y.o. female who presents to [facility] clinic today with no complaints.    Pap Smear: Pap smear not completed today. Last Pap smear was 05/15/2019 at the free cervical cancer screening clinic and was normal with negative HPV. Per patient has no history of an abnormal Pap smear. Patient has a history of a hysterectomy 10/17/2019 due to endometriosis. Patient doesn't need any further Pap smears due to her history of a hysterectomy for benign reasons per BCCCP and ASCCP guidelines. Last Pap smear result is available in Epic.   Physical exam: Breasts Breasts symmetrical. No skin abnormalities bilateral breasts. No nipple retraction bilateral breasts. No nipple discharge bilateral breasts. No lymphadenopathy. No lumps palpated bilateral breasts. No complaints of pain or tenderness on exam.  MM 3D SCREEN BREAST BILATERAL Result Date: 12/10/2021 CLINICAL DATA:  Screening. EXAM: DIGITAL SCREENING BILATERAL MAMMOGRAM WITH TOMOSYNTHESIS AND CAD TECHNIQUE: Bilateral screening digital craniocaudal and mediolateral oblique mammograms were obtained. Bilateral screening digital breast tomosynthesis was performed. The images were evaluated with computer-aided detection. COMPARISON:  Previous exam(s). ACR Breast Density Category c: The breast tissue is heterogeneously dense, which may obscure small masses. FINDINGS: There are no findings suspicious for malignancy. IMPRESSION: No mammographic evidence of malignancy. A result letter of this screening mammogram will be mailed directly to the patient. RECOMMENDATION: Screening mammogram in one year. (Code:SM-B-01Y) BI-RADS CATEGORY  1: Negative. Electronically Signed   By: Norva Pavlov M.D.   On: 12/10/2021 08:31   MM 3D SCREEN BREAST BILATERAL Result Date: 11/02/2020 CLINICAL DATA:  Screening. EXAM: DIGITAL SCREENING BILATERAL MAMMOGRAM WITH TOMOSYNTHESIS AND CAD TECHNIQUE: Bilateral screening digital craniocaudal and mediolateral  oblique mammograms were obtained. Bilateral screening digital breast tomosynthesis was performed. The images were evaluated with computer-aided detection. COMPARISON:  Previous exam(s). ACR Breast Density Category c: The breast tissue is heterogeneously dense, which may obscure small masses. FINDINGS: There are no findings suspicious for malignancy. The images were evaluated with computer-aided detection. IMPRESSION: No mammographic evidence of malignancy. A result letter of this screening mammogram will be mailed directly to the patient. RECOMMENDATION: Screening mammogram in one year. (Code:SM-B-01Y) BI-RADS CATEGORY  1: Negative. Electronically Signed   By: Frederico Hamman M.D.   On: 11/02/2020 07:25   MS DIGITAL SCREENING TOMO BILATERAL Result Date: 08/01/2019 CLINICAL DATA:  Screening. EXAM: DIGITAL SCREENING BILATERAL MAMMOGRAM WITH TOMO AND CAD COMPARISON:  Previous exam(s). ACR Breast Density Category c: The breast tissue is heterogeneously dense, which may obscure small masses. FINDINGS: There are no findings suspicious for malignancy. Images were processed with CAD. IMPRESSION: No mammographic evidence of malignancy. A result letter of this screening mammogram will be mailed directly to the patient. RECOMMENDATION: Screening mammogram in one year. (Code:SM-B-01Y) BI-RADS CATEGORY  1: Negative. Electronically Signed   By: Amie Portland M.D.   On: 08/01/2019 15:07    Pelvic/Bimanual Pap is not indicated today per BCCCP guidelines.   Smoking History: Patient has never smoked.   Patient Navigation: Patient education provided. Access to services provided for patient through [place] program. Spanish interpreter Natale Lay from [facility] provided.   Colorectal Cancer Screening: Per patient has had colonoscopy completed on 05/25/2023.  No complaints today.    Breast and Cervical Cancer Risk Assessment: Patient does not have family history of breast cancer, known genetic mutations, or radiation  treatment to the chest before age 33. Patient does not have history of cervical dysplasia, immunocompromised, or DES exposure in-utero.  Risk Scores as of Encounter on 07/22/2023  Dondra Spry           5-year 0.34%   Lifetime 4.41%            Last calculated by Caprice Red, CMA on 07/22/2023 at 11:36 AM        A: BCCCP exam without pap smear No complaints.  P: Referred patient to the [facility] for a screening mammogram on mobile unit. Appointment scheduled Thursday, July 22, 2023 at 1100.  Priscille Heidelberg, RN 07/22/2023 12:57 PM

## 2023-07-27 ENCOUNTER — Other Ambulatory Visit: Payer: Self-pay

## 2023-07-27 ENCOUNTER — Ambulatory Visit (HOSPITAL_BASED_OUTPATIENT_CLINIC_OR_DEPARTMENT_OTHER): Payer: Self-pay | Admitting: Family Medicine

## 2023-08-05 ENCOUNTER — Other Ambulatory Visit: Payer: Self-pay

## 2023-08-09 ENCOUNTER — Other Ambulatory Visit (HOSPITAL_COMMUNITY)
Admission: RE | Admit: 2023-08-09 | Discharge: 2023-08-09 | Disposition: A | Discharge status: Home / Self Care | Source: Ambulatory Visit | Attending: Family Medicine | Admitting: Family Medicine

## 2023-08-09 ENCOUNTER — Other Ambulatory Visit: Payer: Self-pay

## 2023-08-09 ENCOUNTER — Ambulatory Visit: Payer: No Typology Code available for payment source | Admitting: Family Medicine

## 2023-08-09 ENCOUNTER — Encounter (HOSPITAL_BASED_OUTPATIENT_CLINIC_OR_DEPARTMENT_OTHER): Payer: Self-pay | Admitting: Family Medicine

## 2023-08-09 ENCOUNTER — Ambulatory Visit (INDEPENDENT_AMBULATORY_CARE_PROVIDER_SITE_OTHER): Admitting: Family Medicine

## 2023-08-09 ENCOUNTER — Other Ambulatory Visit (HOSPITAL_BASED_OUTPATIENT_CLINIC_OR_DEPARTMENT_OTHER): Payer: Self-pay

## 2023-08-09 VITALS — BP 104/65 | HR 82 | Ht 61.0 in | Wt 233.3 lb

## 2023-08-09 DIAGNOSIS — B3731 Acute candidiasis of vulva and vagina: Secondary | ICD-10-CM | POA: Insufficient documentation

## 2023-08-09 DIAGNOSIS — E1169 Type 2 diabetes mellitus with other specified complication: Secondary | ICD-10-CM

## 2023-08-09 DIAGNOSIS — I152 Hypertension secondary to endocrine disorders: Secondary | ICD-10-CM

## 2023-08-09 DIAGNOSIS — E1165 Type 2 diabetes mellitus with hyperglycemia: Secondary | ICD-10-CM

## 2023-08-09 DIAGNOSIS — F32A Depression, unspecified: Secondary | ICD-10-CM

## 2023-08-09 DIAGNOSIS — F419 Anxiety disorder, unspecified: Secondary | ICD-10-CM

## 2023-08-09 DIAGNOSIS — E785 Hyperlipidemia, unspecified: Secondary | ICD-10-CM

## 2023-08-09 DIAGNOSIS — E1159 Type 2 diabetes mellitus with other circulatory complications: Secondary | ICD-10-CM

## 2023-08-09 DIAGNOSIS — K760 Fatty (change of) liver, not elsewhere classified: Secondary | ICD-10-CM

## 2023-08-09 DIAGNOSIS — Z794 Long term (current) use of insulin: Secondary | ICD-10-CM

## 2023-08-09 MED ORDER — FREESTYLE LIBRE 14 DAY SENSOR MISC
1.0000 | 11 refills | Status: DC
  Filled 2023-08-09: qty 2, fill #0
  Filled 2023-08-09 (×2): qty 2, 28d supply, fill #0
  Filled 2023-09-09: qty 2, 28d supply, fill #1
  Filled 2023-10-13: qty 2, 28d supply, fill #2

## 2023-08-09 MED ORDER — NYSTATIN-TRIAMCINOLONE 100000-0.1 UNIT/GM-% EX OINT
1.0000 | TOPICAL_OINTMENT | Freq: Two times a day (BID) | CUTANEOUS | 3 refills | Status: DC
  Filled 2023-08-09 (×2): qty 60, 30d supply, fill #0

## 2023-08-09 MED ORDER — FLUOXETINE HCL 10 MG PO CAPS
10.0000 mg | ORAL_CAPSULE | Freq: Every day | ORAL | 3 refills | Status: DC
  Filled 2023-08-09 (×2): qty 30, 30d supply, fill #0

## 2023-08-09 MED ORDER — FLUCONAZOLE 150 MG PO TABS
150.0000 mg | ORAL_TABLET | Freq: Once | ORAL | 0 refills | Status: AC
  Filled 2023-08-09 (×2): qty 2, 2d supply, fill #0

## 2023-08-09 NOTE — Patient Instructions (Signed)
 Please return in the next week for fasting labwork.

## 2023-08-09 NOTE — Progress Notes (Unsigned)
 Subjective:   Alexandra Norris 14-May-1976 08/09/2023  Chief Complaint  Patient presents with   Blister    Patient has been having irritation and blisters in her vaginal area which began to get worse about 2 weeks ago. Also has been having problems with anxiety.    HPI: Alexandra Norris presents today for re-assessment and management of chronic medical conditions.   RASH: Onset: 2 weeks. Patient states she begins having a rash present to the vaginal area when her glucose levels are increased.  Reports mild bleeding from the rash due to severe irritation to external labia.  He denies vaginal bleeding or discharge.  Reports significant vaginal itching that is constant and unrelieved with topical therapies.  Patient previously treated with Diflucan and nystatin cream for vulvovaginal candidiasis.  Patient states that she does not check her blood sugars more than once a week and has uncontrolled type 2 diabetes.  She does not monitor her diet or exercise.   ANXIETY: Alexandra Norris presents for the medical management of anxiety. Patient states she is constantly biting her nails, and feels overwhelmed and at times has panic like symptoms.  She would like to be placed on medication to help with overwhelming anxiety.  In the past she has been on BuSpar and sertraline without improvement. Current medication regimen: none Counseling: Recommended Denies SI/HI.     08/09/2023   10:27 AM 03/09/2023   11:30 AM 02/22/2023    2:40 PM 02/16/2023   12:09 PM  GAD 7 : Generalized Anxiety Score  Nervous, Anxious, on Edge 1 2 2 2   Control/stop worrying 2 2 2 1   Worry too much - different things 2 2 2 1   Trouble relaxing 2 1 2 1   Restless 2 1 2 1   Easily annoyed or irritable 3 1 2 1   Afraid - awful might happen 0 0 2 0  Total GAD 7 Score 12 9 14 7   Anxiety Difficulty Somewhat difficult Somewhat difficult Not difficult at all Not difficult at all       08/09/2023   10:24 AM 03/09/2023   11:29 AM 02/22/2023    2:40 PM 02/16/2023   12:10 PM 07/28/2022    4:24 PM  Depression screen PHQ 2/9  Decreased Interest 0 2 2 0 1  Down, Depressed, Hopeless 1 1 1  0 1  PHQ - 2 Score 1 3 3  0 2  Altered sleeping 3 2 2  2   Tired, decreased energy 3 2 1  2   Change in appetite 2 2 2  2   Feeling bad or failure about yourself  1 1 2   0  Trouble concentrating 2 2 1  2   Moving slowly or fidgety/restless 1 1 1  2   Suicidal thoughts 0 0 1  0  PHQ-9 Score 13 13 13  12   Difficult doing work/chores Somewhat difficult Somewhat difficult Somewhat difficult  Extremely dIfficult    DIABETES MELLITUS: Alexandra Norris presents for the medical management of diabetes.  Current diabetes medication regimen: Glipizide 5 mg, Tresiba 30 units daily, metformin 1000 mg Patient is not adhering to a diabetic diet.  Patient is not exercising regularly.  Patient is not checking BS regularly.  Patient states she only checks her blood sugar once a week. Patient is  checking their feet regularly.  Denies polydipsia, polyphagia, polyuria, open wounds or ulcers on feet.   Lab Results  Component Value Date   HGBA1C 12.8 (A) 04/30/2023    Foot  Exam: 04/22/2021 Lab Results  Component Value Date   LABMICR 6.2 08/07/2020   MICROALBUR 10 04/22/2021    Wt Readings from Last 3 Encounters:  08/09/23 233 lb 4.8 oz (105.8 kg)  07/22/23 232 lb (105.2 kg)  05/26/23 228 lb 4.8 oz (103.6 kg)     HYPERLIPIDEMIA: Alexandra Norris presents for the medical management of hyperlipidemia.  Patient's current HLD regimen is: Atorvastatin -  Patient is *** currently taking prescribed medications for HLD.  Adhering to heathy diet: *** Exercising regularly: *** Denies myalgias.  Lab Results  Component Value Date   CHOL 211 (H) 08/18/2021   HDL 46 08/18/2021   LDLCALC 131 (H) 08/18/2021   TRIG 189 (H) 08/18/2021   CHOLHDL 4.6 (H) 08/18/2021    Stopped  Lisinopril- due to HTN.   The following portions of the patient's history were reviewed and updated as appropriate: past medical history, past surgical history, family history, social history, allergies, medications, and problem list.   Patient Active Problem List   Diagnosis Date Noted   Family history of colon cancer 05/25/2023   Benign neoplasm of colon 05/25/2023   Acute intractable headache 03/31/2023   Uncontrolled type 2 diabetes mellitus with hyperglycemia (HCC) 07/28/2022   Asthma due to seasonal allergies 09/25/2021   Arthritis of right acromioclavicular joint 04/24/2021   Chronic left shoulder pain 04/22/2021   Hypertension associated with diabetes (HCC) 04/22/2021   Hyperlipidemia associated with type 2 diabetes mellitus (HCC) 04/22/2021   Fatty liver disease, nonalcoholic 09/09/2020   Vitamin D deficiency 09/25/2015   Anxiety and depression 07/06/2014   GASTROESOPHAGEAL REFLUX DISEASE 08/14/2009   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 level with radiculopathy 04/03/2020   Cervical post-laminectomy syndrome 03/06/2022   Cubital tunnel syndrome on right 12/02/2021   Depression    DEPRESSION, SITUATIONAL, PROLONGED 02/05/2009   Qualifier: Diagnosis of  By: Wallene Huh  MD, Khary     Diabetes mellitus without complication (HCC)    Dizziness 04/22/2021   Dyspnea    Elevated blood-pressure reading, without diagnosis of hypertension 06/17/2020   Endometriosis    Fatigue 04/22/2021   History of blood transfusion 1999   w/vaginal delivery   History of bronchitis    "I've stayed in hospital 2 X w/bronchitis"   Hyperlipidemia    Hypertension    Impingement syndrome of left shoulder    Influenza vaccine needed 04/03/2020   Left carpal tunnel syndrome 07/11/2019   Migraine    Moderate persistent asthma with acute  exacerbation 12/06/2006   Qualifier: Diagnosis of  By: Wallene Huh  MD, Khary     Nasal obstruction 02/18/2016   Formatting of this note might be different from the original.  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   Pre-diabetes 10/03/2019   A1C 6.4   Right carpal tunnel syndrome 07/11/2019   S/P arthroscopy of right shoulder 07/08/2021   S/P carpal tunnel release 08/30/2019   Seasonal allergies 10/08/2016   Shingles 11/14/2020   Sinusitis, bacterial 10/07/2021   Snoring 01/20/2022   Past Surgical History:  Procedure Laterality Date   ABDOMINAL HYSTERECTOMY  10/17/2019   APPENDECTOMY  CARPAL TUNNEL RELEASE Right 07/19/2019   Procedure: RIGHT CARPAL TUNNEL RELEASE;  Surgeon: Tarry Kos, MD;  Location: [facility];  Service: Orthopedics;  Laterality: Right;   CARPAL TUNNEL RELEASE Left 09/13/2019   Procedure: LEFT CARPAL TUNNEL RELEASE;  Surgeon: Tarry Kos, MD;  Location: [facility];  Service: Orthopedics;  Laterality: Left;  Bier block   CESAREAN SECTION  1997; 2008   CHOLECYSTECTOMY  2011   COLONOSCOPY WITH PROPOFOL N/A 05/25/2023   Procedure: COLONOSCOPY WITH PROPOFOL;  Surgeon: Benancio Deeds, MD;  Location: [facility];  Service: Gastroenterology;  Laterality: N/A;   POLYPECTOMY  05/25/2023   Procedure: POLYPECTOMY;  Surgeon: Benancio Deeds, MD;  Location: [facility];  Service: Gastroenterology;;   SHOULDER ARTHROSCOPY WITH DISTAL CLAVICLE RESECTION  02/12/2021   Procedure: SHOULDER ARTHROSCOPY WITH DISTAL CLAVICLE RESECTION;  Surgeon: Tarry Kos, MD;  Location: [facility];  Service: Orthopedics;;   SHOULDER ARTHROSCOPY WITH ROTATOR CUFF REPAIR AND SUBACROMIAL DECOMPRESSION Left 02/12/2021   Procedure: LEFT SHOULDER ARTHROSCOPY WITH ROTATOR CUFF REPAIR, SUBACROMIAL DECOMPRESSION,  EXTENSIVE DEBRIDEMENT;  Surgeon: Tarry Kos, MD;  Location: [facility];  Service: Orthopedics;  Laterality: Left;   TONSILLECTOMY     tonsilletomy     TUBAL LIGATION  04/2007   ULNAR TUNNEL RELEASE Right 12/17/2021   Procedure: RIGHT CUBITAL TUNNEL RELEASE;  Surgeon: Tarry Kos, MD;  Location: [facility];  Service: Orthopedics;  Laterality: Right;   VAGINAL HYSTERECTOMY Bilateral 10/17/2019   Procedure: HYSTERECTOMY VAGINAL WITH SALPINGECTOMY;  Surgeon: Hermina Staggers, MD;  Location: [facility];  Service: Gynecology;  Laterality: Bilateral;   Family History  Problem Relation Age of Onset   Diabetes Mother    Osteoarthritis Mother    Hypertension Mother    Hyperlipidemia Mother    Arthritis Mother    Colon cancer Mother    Colon polyps Neg Hx    Esophageal cancer Neg Hx    Stomach cancer Neg Hx    Ulcerative colitis Neg Hx    Breast cancer Neg Hx    Outpatient Medications Prior to Visit  Medication Sig Dispense Refill   albuterol (VENTOLIN HFA) 108 (90 Base) MCG/ACT inhaler Inhale 2 puffs into the lungs every 6 (six) hours as needed for wheezing or shortness of breath. 6.7 g 0   atorvastatin (LIPITOR) 20 MG tablet Take 1 tablet (20 mg total) by mouth at bedtime. 90 tablet 3   butalbital-acetaminophen-caffeine (FIORICET) 50-325-40 MG tablet Take 1 tablet by mouth every 6 (six) hours as needed for headache. 14 tablet 0   glipiZIDE (GLUCOTROL) 5 MG tablet Take 1 tablet (5 mg total) by mouth daily before breakfast. 30 tablet 1   ibuprofen (ADVIL) 800 MG tablet Take 1 tablet (800 mg total) by mouth every 8 (eight) hours as needed. 30 tablet 0   insulin degludec (TRESIBA FLEXTOUCH) 100 UNIT/ML FlexTouch Pen Inject 30 Units into the skin daily. 9 mL 3   Insulin Pen Needle (1ST TIER UNIFINE PENTIPS) 32G X 4 MM MISC Use as directed with Evaristo Bury Flextouch pen 100 each 12   lisinopril (ZESTRIL) 10 MG tablet Take 1 tablet (10 mg total) by mouth daily. 90 tablet 3    metFORMIN (GLUCOPHAGE) 500 MG tablet Take 2 tablets (1,000 mg total) by mouth daily with breakfast AND 2 tablets (1,000 mg total) every evening. 270 tablet 3   Multiple Vitamin (MULTIVITAMIN WITH MINERALS) TABS tablet Take 1 tablet by mouth daily  in the afternoon.     No facility-administered medications prior to visit.   No Known Allergies   ROS: A complete ROS was performed with pertinent positives/negatives noted in the HPI. The remainder of the ROS are negative.    Objective:   Today's Vitals   08/09/23 1019  BP: 104/65  Pulse: 82  SpO2: 96%  Weight: 233 lb 4.8 oz (105.8 kg)  Height: 5\' 1"  (1.549 m)    Physical Exam          GENERAL: Well-appearing, in NAD. Well nourished.  SKIN: Pink, warm and dry. No rash, lesion, ulceration, or ecchymoses.  Head: Normocephalic. NECK: Trachea midline. Full ROM w/o pain or tenderness. No lymphadenopathy.  EARS: Tympanic membranes are intact, translucent without bulging and without drainage. Appropriate landmarks visualized.  EYES: Conjunctiva clear without exudates. EOMI, PERRL, no drainage present.  NOSE: Septum midline w/o deformity. Nares patent, mucosa pink and non-inflamed w/o drainage. No sinus tenderness.  THROAT: Uvula midline. Oropharynx clear. Tonsils non-inflamed without exudate. Mucous membranes pink and moist.  RESPIRATORY: Chest wall symmetrical. Respirations even and non-labored. Breath sounds clear to auscultation bilaterally.  CARDIAC: S1, S2 present, regular rate and rhythm without murmur or gallops. Peripheral pulses 2+ bilaterally.  MSK: Muscle tone and strength appropriate for age. Joints w/o tenderness, redness, or swelling.  EXTREMITIES: Without clubbing, cyanosis, or edema.  NEUROLOGIC: No motor or sensory deficits. Steady, even gait. C2-C12 intact.  PSYCH/MENTAL STATUS: Alert, oriented x 3. Cooperative, appropriate mood and affect.   Health Maintenance Due  Topic Date Due   Diabetic kidney evaluation - Urine ACR   04/22/2022   FOOT EXAM  04/22/2022    No results found for any visits on 08/09/23.  The 10-year ASCVD risk score (Arnett DK, et al., 2019) is: 1.6%     Assessment & Plan:  *** There are no diagnoses linked to this encounter.  No orders of the defined types were placed in this encounter.  Lab Orders  No laboratory test(s) ordered today   No images are attached to the encounter or orders placed in the encounter.  No follow-ups on file.    Patient to reach out to office if new, worrisome, or unresolved symptoms arise or if no improvement in patient's condition. Patient verbalized understanding and is agreeable to treatment plan. All questions answered to patient's satisfaction.    Hilbert Bible, [place]

## 2023-08-10 ENCOUNTER — Ambulatory Visit (HOSPITAL_BASED_OUTPATIENT_CLINIC_OR_DEPARTMENT_OTHER): Admitting: Family Medicine

## 2023-08-10 LAB — CERVICOVAGINAL ANCILLARY ONLY
Bacterial Vaginitis (gardnerella): NEGATIVE
Candida Glabrata: NEGATIVE
Candida Vaginitis: POSITIVE — AB
Comment: NEGATIVE
Comment: NEGATIVE
Comment: NEGATIVE

## 2023-08-10 NOTE — Progress Notes (Signed)
 Presence of yeast seen on cervicovaginal swab.  Diflucan already sent in for patient.  No change in treatment needed

## 2023-08-16 ENCOUNTER — Ambulatory Visit: Payer: Self-pay | Admitting: Gastroenterology

## 2023-08-26 ENCOUNTER — Ambulatory Visit: Payer: Self-pay | Admitting: Gastroenterology

## 2023-08-27 ENCOUNTER — Other Ambulatory Visit (HOSPITAL_BASED_OUTPATIENT_CLINIC_OR_DEPARTMENT_OTHER): Payer: Self-pay

## 2023-08-27 ENCOUNTER — Ambulatory Visit (INDEPENDENT_AMBULATORY_CARE_PROVIDER_SITE_OTHER): Payer: Self-pay | Admitting: Gastroenterology

## 2023-08-27 ENCOUNTER — Encounter: Payer: Self-pay | Admitting: Gastroenterology

## 2023-08-27 VITALS — BP 138/82 | HR 80 | Ht 61.0 in | Wt 241.0 lb

## 2023-08-27 DIAGNOSIS — K6289 Other specified diseases of anus and rectum: Secondary | ICD-10-CM

## 2023-08-27 DIAGNOSIS — K625 Hemorrhage of anus and rectum: Secondary | ICD-10-CM

## 2023-08-27 DIAGNOSIS — K602 Anal fissure, unspecified: Secondary | ICD-10-CM

## 2023-08-27 DIAGNOSIS — Z860100 Personal history of colon polyps, unspecified: Secondary | ICD-10-CM

## 2023-08-27 MED ORDER — DILTIAZEM GEL 2 %
CUTANEOUS | 1 refills | Status: DC
Start: 2023-08-27 — End: 2024-02-01

## 2023-08-27 MED ORDER — DILTIAZEM GEL 2 %
CUTANEOUS | 1 refills | Status: DC
  Filled 2023-08-27: qty 30, fill #0

## 2023-08-27 NOTE — Progress Notes (Signed)
 Chief Complaint: Rectal bleeding Primary GI MD: Dr. Adela Lank  HPI: Discussed the use of AI scribe software for clinical note transcription with the patient, who gave verbal consent to proceed.  Translator is present  History of Present Illness   Alexandra Norris is a 48 year old female who presents with rectal pain and bleeding.  She has been experiencing rectal pain and bleeding for over a year, with symptoms worsening in recent months. The pain is a burning pain and has been significant during bowel movements and is accompanied by bleeding that turns the toilet water red and is visible on toilet paper. The pain is described as a burning sensation that persists after bowel movements, along with itching that follows.  No changes in bowel habits, with normal bowel movements and no constipation or diarrhea.     Patient recently had a colonoscopy 3 months ago with small tubular adenoma and a recall of 7 years (2031).  Internal hemorrhoids were also noted   PREVIOUS GI WORKUP   Colonoscopy 05/2023 - One 4 mm polyp at the hepatic flexure, removed with a cold snare. Resected and retrieved. - Internal hemorrhoids. - The examination was otherwise normal.  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 level with radiculopathy 04/03/2020   Cervical post-laminectomy syndrome 03/06/2022   Cubital tunnel syndrome on right 12/02/2021   Depression    DEPRESSION, SITUATIONAL, PROLONGED 02/05/2009   Qualifier: Diagnosis of  By: Wallene Huh  MD, Khary     Diabetes mellitus without complication (HCC)    Dizziness 04/22/2021   Dyspnea    Elevated blood-pressure reading, without diagnosis of hypertension 06/17/2020   Endometriosis    Fatigue 04/22/2021   History of blood transfusion 1999   w/vaginal delivery   History of bronchitis     "I've stayed in hospital 2 X w/bronchitis"   Hyperlipidemia    Hypertension    Impingement syndrome of left shoulder    Influenza vaccine needed 04/03/2020   Left carpal tunnel syndrome 07/11/2019   Migraine    Moderate persistent asthma with acute exacerbation 12/06/2006   Qualifier: Diagnosis of  By: Wallene Huh  MD, Rande Lawman     Nasal obstruction 02/18/2016   Formatting of this note might be different from the original.  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   Pre-diabetes 10/03/2019   A1C 6.4   Right carpal tunnel syndrome 07/11/2019   S/P arthroscopy of right shoulder 07/08/2021   S/P carpal tunnel release 08/30/2019   Seasonal allergies 10/08/2016   Shingles 11/14/2020   Sinusitis, bacterial 10/07/2021   Snoring 01/20/2022    Past Surgical History:  Procedure Laterality Date   ABDOMINAL HYSTERECTOMY  10/17/2019   APPENDECTOMY     CARPAL TUNNEL RELEASE Right 07/19/2019   Procedure: RIGHT CARPAL TUNNEL RELEASE;  Surgeon: Tarry Kos, MD;  Location: [facility];  Service: Orthopedics;  Laterality: Right;   CARPAL TUNNEL RELEASE Left 09/13/2019   Procedure: LEFT CARPAL TUNNEL RELEASE;  Surgeon: Tarry Kos, MD;  Location:  SURGERY CENTER;  Service: Orthopedics;  Laterality: Left;  Bier block   CESAREAN SECTION  1997;  2008   CHOLECYSTECTOMY  2011   COLONOSCOPY WITH PROPOFOL N/A 05/25/2023   Procedure: COLONOSCOPY WITH PROPOFOL;  Surgeon: Benancio Deeds, MD;  Location: [facility];  Service: Gastroenterology;  Laterality: N/A;   POLYPECTOMY  05/25/2023   Procedure: POLYPECTOMY;  Surgeon: Benancio Deeds, MD;  Location: [facility];  Service: Gastroenterology;;   SHOULDER ARTHROSCOPY WITH DISTAL CLAVICLE RESECTION  02/12/2021   Procedure: SHOULDER ARTHROSCOPY WITH DISTAL CLAVICLE RESECTION;  Surgeon: Tarry Kos, MD;  Location: [facility];  Service: Orthopedics;;   SHOULDER ARTHROSCOPY WITH ROTATOR CUFF REPAIR AND SUBACROMIAL DECOMPRESSION Left 02/12/2021   Procedure: LEFT SHOULDER ARTHROSCOPY WITH ROTATOR CUFF REPAIR, SUBACROMIAL DECOMPRESSION, EXTENSIVE DEBRIDEMENT;  Surgeon: Tarry Kos, MD;  Location: [facility];  Service: Orthopedics;  Laterality: Left;   TONSILLECTOMY     tonsilletomy     TUBAL LIGATION  04/2007   ULNAR TUNNEL RELEASE Right 12/17/2021   Procedure: RIGHT CUBITAL TUNNEL RELEASE;  Surgeon: Tarry Kos, MD;  Location:  SURGERY CENTER;  Service: Orthopedics;  Laterality: Right;   VAGINAL HYSTERECTOMY Bilateral 10/17/2019   Procedure: HYSTERECTOMY VAGINAL WITH SALPINGECTOMY;  Surgeon: Hermina Staggers, MD;  Location: [facility];  Service: Gynecology;  Laterality: Bilateral;    Current Outpatient Medications  Medication Sig Dispense Refill   albuterol (VENTOLIN HFA) 108 (90 Base) MCG/ACT inhaler Inhale 2 puffs into the lungs every 6 (six) hours as needed for wheezing or shortness of breath. 6.7 g 0   atorvastatin (LIPITOR) 20 MG tablet Take 1 tablet (20 mg total) by mouth at bedtime. 90 tablet 3   butalbital-acetaminophen-caffeine (FIORICET) 50-325-40 MG tablet Take 1 tablet by mouth every 6 (six) hours as needed for headache. 14 tablet 0   Continuous Glucose Sensor (FREESTYLE LIBRE 14 DAY SENSOR) MISC Apply upper deltoid every 14 days, use reader to determine blood sugars. 2 each 11   FLUoxetine (PROZAC) 10 MG capsule Take 1 capsule (10 mg total) by mouth daily. 30 capsule 3   glipiZIDE (GLUCOTROL) 5 MG tablet Take 1 tablet (5 mg total) by mouth daily before breakfast. 30 tablet 1   ibuprofen (ADVIL) 800 MG tablet Take 1 tablet (800 mg total) by mouth every 8 (eight) hours as needed. 30 tablet 0   insulin degludec (TRESIBA FLEXTOUCH) 100 UNIT/ML FlexTouch Pen Inject 30 Units into the skin daily. 9 mL 3   Insulin Pen Needle (1ST TIER UNIFINE PENTIPS)  32G X 4 MM MISC Use as directed with Evaristo Bury Flextouch pen 100 each 12   metFORMIN (GLUCOPHAGE) 500 MG tablet Take 2 tablets (1,000 mg total) by mouth daily with breakfast AND 2 tablets (1,000 mg total) every evening. 270 tablet 3   Multiple Vitamin (MULTIVITAMIN WITH MINERALS) TABS tablet Take 1 tablet by mouth daily in the afternoon.     nystatin-triamcinolone ointment (MYCOLOG) Apply 1 Application topically 2 (two) times daily. 60 g 3   diltiazem 2 % GEL With 5% lidocaine. Apply to rectum 3 times daily for 6-8 weeks. 30 g 1   No current facility-administered medications for this visit.    Allergies as of 08/27/2023   (No Known Allergies)    Family History  Problem Relation Age of Onset   Diabetes Mother    Osteoarthritis Mother    Hypertension Mother    Hyperlipidemia Mother    Arthritis Mother    Colon cancer Mother    Colon polyps Neg Hx    Esophageal cancer Neg Hx    Stomach  cancer Neg Hx    Ulcerative colitis Neg Hx    Breast cancer Neg Hx     Social History   Socioeconomic History   Marital status: Married    Spouse name: Not on file   Number of children: 3   Years of education: Not on file   Highest education level: 12th grade  Occupational History   Not on file  Tobacco Use   Smoking status: Never   Smokeless tobacco: Never  Vaping Use   Vaping status: Never Used  Substance and Sexual Activity   Alcohol use: No   Drug use: No   Sexual activity: Yes    Birth control/protection: Surgical    Comment: Hysterectomy  Other Topics Concern   Not on file  Social History Narrative   Pt was born in [place]   Caffeine none.     Education: 6 th grade.    Work:  remote    Social Drivers of Corporate investment banker Strain: Medium Risk (02/04/2023)   Overall Financial Resource Strain (CARDIA)    Difficulty of Paying Living Expenses: Somewhat hard  Food Insecurity: No Food Insecurity (07/22/2023)   Hunger Vital Sign    Worried About Running Out of Food in the  Last Year: Never true    Ran Out of Food in the Last Year: Never true  Transportation Needs: No Transportation Needs (07/22/2023)   PRAPARE - Administrator, Civil Service (Medical): No    Lack of Transportation (Non-Medical): No  Physical Activity: Insufficiently Active (02/04/2023)   Exercise Vital Sign    Days of Exercise per Week: 3 days    Minutes of Exercise per Session: 30 min  Stress: Stress Concern Present (02/04/2023)   [employer] of Occupational Health - Occupational Stress Questionnaire    Feeling of Stress : Rather much  Social Connections: Moderately Integrated (02/04/2023)   Social Connection and Isolation Panel [NHANES]    Frequency of Communication with Friends and Family: More than three times a week    Frequency of Social Gatherings with Friends and Family: Once a week    Attends Religious Services: 1 to 4 times per year    Active Member of [employer] or Organizations: No    Attends Banker Meetings: Never    Marital Status: Married  Catering manager Violence: Not At Risk (02/04/2023)   Humiliation, Afraid, Rape, and Kick questionnaire    Fear of Current or Ex-Partner: No    Emotionally Abused: No    Physically Abused: No    Sexually Abused: No    Review of Systems:    Constitutional: No weight loss, fever, chills, weakness or fatigue HEENT: Eyes: No change in vision               Ears, Nose, Throat:  No change in hearing or congestion Skin: No rash or itching Cardiovascular: No chest pain, chest pressure or palpitations   Respiratory: No SOB or cough Gastrointestinal: See HPI and otherwise negative Genitourinary: No dysuria or change in urinary frequency Neurological: No headache, dizziness or syncope Musculoskeletal: No new muscle or joint pain Hematologic: No bleeding or bruising Psychiatric: No history of depression or anxiety    Physical Exam:  Vital signs: BP 138/82   Pulse 80   Ht 5\' 1"  (1.549 m)   Wt 241 lb (109.3 kg)    LMP 09/20/2019 (Exact Date)   BMI 45.54 kg/m   Constitutional: NAD, Well developed, Well nourished, alert and cooperative  Head:  Normocephalic and atraumatic. Eyes:   PEERL, EOMI. No icterus. Conjunctiva pink. Respiratory: Respirations even and unlabored. Lungs clear to auscultation bilaterally.   No wheezes, crackles, or rhonchi.  Cardiovascular:  Regular rate and rhythm. No peripheral edema, cyanosis or pallor.  Gastrointestinal:  Soft, nondistended, nontender. No rebound or guarding. Normal bowel sounds. No appreciable masses or hepatomegaly. Rectal:  Not performed.  Adamantly but politely declined Msk:  Symmetrical without gross deformities. Without edema, no deformity or joint abnormality.  Neurologic:  Alert and  oriented x4;  grossly normal neurologically.  Skin:   Dry and intact without significant lesions or rashes. Psychiatric: Oriented to person, place and time. Demonstrates good judgement and reason without abnormal affect or behaviors.   RELEVANT LABS AND IMAGING: CBC    Component Value Date/Time   WBC 9.7 03/29/2023 1122   WBC 9.5 03/29/2023 1122   RBC 5.33 (H) 03/29/2023 1122   RBC 5.34 (H) 03/29/2023 1122   HGB 14.2 03/29/2023 1122   HGB 14.2 03/29/2023 1122   HGB 13.3 12/16/2021 1150   HCT 44.2 03/29/2023 1122   HCT 44.4 03/29/2023 1122   HCT 41.4 12/16/2021 1150   PLT 214 03/29/2023 1122   PLT 221 03/29/2023 1122   PLT 279 12/16/2021 1150   MCV 82.9 03/29/2023 1122   MCV 83.1 03/29/2023 1122   MCV 82 12/16/2021 1150   MCH 26.6 03/29/2023 1122   MCH 26.6 03/29/2023 1122   MCHC 32.1 03/29/2023 1122   MCHC 32.0 03/29/2023 1122   RDW 13.9 03/29/2023 1122   RDW 13.9 03/29/2023 1122   RDW 14.7 12/16/2021 1150   LYMPHSABS 2.8 03/29/2023 1122   LYMPHSABS 3.1 12/16/2021 1150   MONOABS 0.6 03/29/2023 1122   EOSABS 0.3 03/29/2023 1122   EOSABS 0.3 12/16/2021 1150   BASOSABS 0.1 03/29/2023 1122   BASOSABS 0.0 12/16/2021 1150    CMP     Component Value  Date/Time   NA 132 (L) 03/29/2023 1122   NA 139 12/16/2021 1150   K 4.1 03/29/2023 1122   CL 104 03/29/2023 1122   CO2 21 (L) 03/29/2023 1122   GLUCOSE 326 (H) 03/29/2023 1122   BUN 8 03/29/2023 1122   BUN 6 12/16/2021 1150   CREATININE 0.52 03/29/2023 1122   CREATININE 0.52 01/03/2013 1603   CALCIUM 8.6 (L) 03/29/2023 1122   PROT 6.9 03/29/2023 1409   PROT 7.4 12/16/2021 1150   ALBUMIN 3.3 (L) 03/29/2023 1409   ALBUMIN 4.3 12/16/2021 1150   AST 33 03/29/2023 1409   ALT 43 03/29/2023 1409   ALKPHOS 130 (H) 03/29/2023 1409   BILITOT 0.8 03/29/2023 1409   BILITOT 0.6 12/16/2021 1150   GFRNONAA >60 03/29/2023 1122   GFRNONAA >89 01/03/2013 1603   GFRAA >60 10/18/2019 0149   GFRAA >89 01/03/2013 1603     Assessment/Plan:      Anal fissure Rectal pain Rectal bleeding Symptoms consistent with anal fissure versus internal hemorrhoid.  Recent colonoscopy in the last 3 months is reassuring.  Patient declined rectal exam today. - Prescribe lidocaine 5%/diltiazem 2% compound cream apply three times daily for 6-8 weeks. - Instructed to call if symptoms persist after a few weeks. - Consider suppositories if cream ineffective versus internal hemorrhoid banding - follow up with me 6-8 weeks  History of colon polyps Last colonoscopy 05/2023 - Due 2031    Boone Master, PA-C [facility] 08/27/2023, 1:53 PM  Cc: Alyson Reedy, FNP

## 2023-08-27 NOTE — Patient Instructions (Signed)
 We have sent a prescription for Diltiazem 2% gel to [facility] for you. Using your index finger, you should apply a small amount of medication inside the rectum up to your first knuckle/joint twice daily x 6-8 weeks.  [facility]'s information is below: Address: [address] [place], [place] [address]  [phone]  *Please DO NOT go directly from our office to pick up this medication! Give the pharmacy 1 day to process the prescription as this is compounded and takes time to make.  Follow up in 8-12 weeks. _______________________________________________________  If your blood pressure at your visit was 140/90 or greater, please contact your primary care physician to follow up on this.  _______________________________________________________  If you are age 47 or older, your body mass index should be between 23-30. Your Body mass index is 45.54 kg/m. If this is out of the aforementioned range listed, please consider follow up with your Primary Care Provider.  If you are age 67 or younger, your body mass index should be between 19-25. Your Body mass index is 45.54 kg/m. If this is out of the aformentioned range listed, please consider follow up with your Primary Care Provider.   ________________________________________________________  The [facility] providers would like to encourage you to use [facility] to communicate with providers for non-urgent requests or questions.  Due to long hold times on the telephone, sending your provider a message by [facility] may be a faster and more efficient way to get a response.  Please allow 48 business hours for a response.  Please remember that this is for non-urgent requests.  _______________________________________________________

## 2023-08-30 ENCOUNTER — Ambulatory Visit: Payer: Self-pay | Admitting: Dietician

## 2023-08-30 NOTE — Progress Notes (Signed)
 Agree with assessment and plan as outlined.

## 2023-09-09 ENCOUNTER — Other Ambulatory Visit: Payer: Self-pay

## 2023-09-21 ENCOUNTER — Ambulatory Visit (HOSPITAL_BASED_OUTPATIENT_CLINIC_OR_DEPARTMENT_OTHER): Payer: Self-pay | Admitting: Family Medicine

## 2023-09-23 ENCOUNTER — Ambulatory Visit (HOSPITAL_BASED_OUTPATIENT_CLINIC_OR_DEPARTMENT_OTHER): Payer: Self-pay | Admitting: Family Medicine

## 2023-10-13 ENCOUNTER — Other Ambulatory Visit: Payer: Self-pay

## 2023-10-22 ENCOUNTER — Ambulatory Visit: Payer: Self-pay | Admitting: Dietician

## 2023-11-04 ENCOUNTER — Emergency Department (HOSPITAL_BASED_OUTPATIENT_CLINIC_OR_DEPARTMENT_OTHER)
Admission: EM | Admit: 2023-11-04 | Discharge: 2023-11-04 | Disposition: A | Discharge status: Home / Self Care | Payer: Self-pay | Attending: Emergency Medicine | Admitting: Emergency Medicine

## 2023-11-04 ENCOUNTER — Other Ambulatory Visit (HOSPITAL_BASED_OUTPATIENT_CLINIC_OR_DEPARTMENT_OTHER): Payer: Self-pay

## 2023-11-04 ENCOUNTER — Emergency Department (HOSPITAL_BASED_OUTPATIENT_CLINIC_OR_DEPARTMENT_OTHER): Payer: Self-pay

## 2023-11-04 ENCOUNTER — Other Ambulatory Visit (HOSPITAL_BASED_OUTPATIENT_CLINIC_OR_DEPARTMENT_OTHER): Payer: Self-pay | Admitting: Family Medicine

## 2023-11-04 ENCOUNTER — Ambulatory Visit: Payer: Self-pay

## 2023-11-04 ENCOUNTER — Other Ambulatory Visit: Payer: Self-pay

## 2023-11-04 DIAGNOSIS — Z794 Long term (current) use of insulin: Secondary | ICD-10-CM | POA: Insufficient documentation

## 2023-11-04 DIAGNOSIS — N61 Mastitis without abscess: Secondary | ICD-10-CM | POA: Insufficient documentation

## 2023-11-04 DIAGNOSIS — N644 Mastodynia: Secondary | ICD-10-CM

## 2023-11-04 DIAGNOSIS — N611 Abscess of the breast and nipple: Secondary | ICD-10-CM

## 2023-11-04 DIAGNOSIS — Z7984 Long term (current) use of oral hypoglycemic drugs: Secondary | ICD-10-CM | POA: Insufficient documentation

## 2023-11-04 DIAGNOSIS — E119 Type 2 diabetes mellitus without complications: Secondary | ICD-10-CM | POA: Insufficient documentation

## 2023-11-04 MED ORDER — DOXYCYCLINE HYCLATE 100 MG PO TABS
100.0000 mg | ORAL_TABLET | Freq: Once | ORAL | Status: AC
  Administered 2023-11-04: 100 mg via ORAL
  Filled 2023-11-04: qty 1

## 2023-11-04 MED ORDER — DOXYCYCLINE HYCLATE 100 MG PO CAPS
100.0000 mg | ORAL_CAPSULE | Freq: Two times a day (BID) | ORAL | 0 refills | Status: DC
  Filled 2023-11-04: qty 20, 10d supply, fill #0

## 2023-11-04 MED ORDER — ACETAMINOPHEN 500 MG PO TABS
1000.0000 mg | ORAL_TABLET | Freq: Once | ORAL | Status: AC
  Administered 2023-11-04: 1000 mg via ORAL
  Filled 2023-11-04: qty 2

## 2023-11-04 NOTE — Telephone Encounter (Signed)
 This RN called the CAL to advise them of patient's current complaints and E.D. Disposition.

## 2023-11-04 NOTE — ED Notes (Signed)
 Reviewed AVS/discharge instruction with patient. Time allotted for and all questions answered. Patient is agreeable for d/c and escorted to ed exit by staff.

## 2023-11-04 NOTE — Telephone Encounter (Signed)
 Noted

## 2023-11-04 NOTE — ED Provider Notes (Signed)
 [place] EMERGENCY DEPARTMENT AT [facility] Provider Note   CSN: [id number] Arrival date & time: 11/04/23  1343     History  Chief Complaint  Patient presents with   Breast Pain    Alexandra Norris is a 48 y.o. female.  Alexandra Norris is a 48 y.o. female presenting with left side breast pain.  Medical history of type 2 diabetes.  Patient reports she has had ongoing breast pain located around the left nipple for 1 week.  She had a fever yesterday to 102.  She has not noted any rash or skin changes around the nipple.  She has no previous episodes of this happening.  She has no family history of breast issues.  Denies nipple discharge.  Describes the pain as a burning that surrounds the nipple.  No nausea or vomiting, no cough or congestion.       Home Medications Prior to Admission medications   Medication Sig Start Date End Date Taking? Authorizing Provider  albuterol  (VENTOLIN  HFA) 108 (90 Base) MCG/ACT inhaler Inhale 2 puffs into the lungs every 6 (six) hours as needed for wheezing or shortness of breath. 10/05/22   de [place], Alonza Jansky, MD  butalbital -acetaminophen -caffeine  (FIORICET ) 50-325-40 MG tablet Take 1 tablet by mouth every 6 (six) hours as needed for headache. 03/31/23   Wilhelmena Hanson, FNP  Continuous Glucose Sensor (FREESTYLE LIBRE 14 DAY SENSOR) MISC Apply upper deltoid every 14 days, use reader to determine blood sugars. 08/09/23   Caudle, Arcola Kocher, FNP  diltiazem  2 % GEL With 5% lidocaine . Apply to rectum 3 times daily for 6-8 weeks. 08/27/23   McMichael, Elba Greathouse, PA-C  FLUoxetine  (PROZAC ) 10 MG capsule Take 1 capsule (10 mg total) by mouth daily. 08/09/23   Caudle, Arcola Kocher, FNP  glipiZIDE  (GLUCOTROL ) 5 MG tablet Take 1 tablet (5 mg total) by mouth daily before breakfast. 05/26/23   Wilhelmena Hanson, FNP  ibuprofen  (ADVIL ) 800 MG tablet Take 1 tablet (800 mg total) by mouth every 8 (eight) hours as needed. 01/16/23      insulin  degludec (TRESIBA  FLEXTOUCH) 100 UNIT/ML FlexTouch Pen Inject 30 Units into the skin daily. 05/28/23   Wilhelmena Hanson, FNP  Insulin  Pen Needle (1ST TIER UNIFINE PENTIPS) 32G X 4 MM MISC Use as directed with Tresiba  Flextouch pen 05/26/23   Wilhelmena Hanson, FNP  metFORMIN  (GLUCOPHAGE ) 500 MG tablet Take 2 tablets (1,000 mg total) by mouth daily with breakfast AND 2 tablets (1,000 mg total) every evening. 02/16/23   Butler, Kristina, FNP  Multiple Vitamin (MULTIVITAMIN WITH MINERALS) TABS tablet Take 1 tablet by mouth daily in the afternoon.    [provider]  nystatin -triamcinolone  ointment (MYCOLOG) Apply 1 Application topically 2 (two) times daily. 08/09/23   Caudle, Arcola Kocher, FNP  atorvastatin  (LIPITOR) 20 MG tablet Take 1 tablet (20 mg total) by mouth at bedtime. 03/04/23 10/13/23  Wilhelmena Hanson, FNP      Allergies    Patient has no known allergies.    Review of Systems   Review of Systems  Physical Exam Updated Vital Signs BP (!) 144/76 (BP Location: Right Arm)   Pulse 95   Temp 98 F (36.7 C)   Resp 18   LMP 09/20/2019 (Exact Date)   SpO2 97%  Physical Exam Vitals and nursing note reviewed.  Constitutional:      General: She is not in acute distress.    Appearance: She is well-developed.  HENT:     Head: Normocephalic and atraumatic.  Eyes:     Conjunctiva/sclera: Conjunctivae normal.  Cardiovascular:     Rate and Rhythm: Normal rate and regular rhythm.     Heart sounds: No murmur heard. Pulmonary:     Effort: Pulmonary effort is normal. No respiratory distress.     Breath sounds: Normal breath sounds.  Chest:     Comments: Patient declined breast exam for female provider. Dr. Tamela Fake will perform. Abdominal:     Palpations: Abdomen is soft.     Tenderness: There is no abdominal tenderness.  Musculoskeletal:        General: No swelling.     Cervical back: Neck supple.  Skin:    General: Skin is warm and dry.     Capillary Refill:  Capillary refill takes less than 2 seconds.  Neurological:     Mental Status: She is alert.  Psychiatric:        Mood and Affect: Mood normal.    ED Results / Procedures / Treatments   Labs (all labs ordered are listed, but only abnormal results are displayed) Labs Reviewed - No data to display  EKG None  Radiology No results found.  Procedures Procedures    Medications Ordered in ED Medications - No data to display  ED Course/ Medical Decision Making/ A&P                                 Medical Decision Making Suspect mastitis versus ductal ectasia. Less likely herpes zoster or breast cancer. Reassuring patient had normal mammogram February of this year. Exam per Dr. Tamela Fake is unclear is mastitis/breast abscess is present. Will attempt breast US  here, and have discussed with patient's PCP who has ordered outpatient diagnostic ultrasound at the breast center. Will treat empirically with 10 day course of doxycycline .   Signed out patient to oncoming ED provider. Patient awaiting US .  Amount and/or Complexity of Data Reviewed Radiology: ordered.  Risk OTC drugs. Prescription drug management.          Final Clinical Impression(s) / ED Diagnoses Final diagnoses:  None    Rx / DC Orders ED Discharge Orders     None         Ivin Marrow, MD 11/04/23 1527    Scarlette Currier, MD 11/04/23 2251

## 2023-11-04 NOTE — Progress Notes (Signed)
 Per Dr. Tamela Fake [facility] ER Provider): requesting follow up breast US  at [facility] for f/u left breast abscess. Order placed.

## 2023-11-04 NOTE — ED Triage Notes (Signed)
 C/o left breast pain x 1 week. States "slightly swollen"

## 2023-11-04 NOTE — Telephone Encounter (Signed)
 Copied from CRM [id number]. Topic: Clinical - Red Word Triage >> Nov 04, 2023 12:18 PM Lizabeth Riggs wrote: Red Word that prompted transfer to Nurse Triage:  She is having pain in her left breast. Pain level a 8-9 out of 10. Blisters and itching in her vagina area.    Chief Complaint: Left breast pain and vaginal itching/blisters Symptoms: pain, itching, blisters, high blood sugar, migraines, excessive thirst, dry mouth, nausea,  Frequency: left breast since Pertinent Negatives: Patient denies chest pain, difficulty breathing, fever, chance of pregnancy,redness/rashes, vomiting,  Disposition: [x] ED /[] Urgent Care (no appt availability in office) / [] Appointment(In office/virtual)/ []  [facility]/ [] Home Care/ [] Refused Recommended Disposition /[] [facility]/ []  Follow-up with PCP Additional Notes: Patient's husband called   Left breast pain since last night per husband.  Blisters and itching x 2 weeks  This RN advised the husband that Urgent Care would likely be the patient's   Spanish Interpreter ID # [phone] utilized in calling the patient to verify this information.  Patient states that the pain is around the nipple of her left breast.  It is very tender to palpation.   She states that she had 3 drops of clear liquid come out of her left nipple about three days ago.  Patient states that because of her blood sugar she has had dryness, itchiness, discomfort, and yeast in her vagina. Patient states that it has been over a year that she keeps having recurrent infections and she keeps having to get medication for it. Patient states that she does have some white discharge and itching. Patient states that her blood sugars have been between 200-220 lately. Patient states that she just saw her PCP two months ago and she has an arm monitor for her blood sugar that she is getting a new one today.   Patient has noticed she has had migraines and some changes in her vision. She  states that her mouth is very dry in the morning no matter how much she drinks. Right now, during triage, patient has a headache and it is constant.  Patient is not at home and cannot check her blood sugar at this time. She states that the last time she checked it was Tuesday morning and it was 360.  Patient is advised that at this time the recommendation is for her to go to the Emergency Room.    Patient is advised that with her multiple concerns and stating that she is having headaches and migraines with her blood sugar being high lately.  Patient states that she might go later in the afternoon.  Patient is advised that the recommendation is that she goes there as soon as she can to get started on being evaluated for her multiple complaints. Patient advised that she understood and would do that.      Reason for Disposition  [1] Symptoms of a yeast infection (i.e., itchy, white discharge, not bad smelling) AND [2] not improved > 3 days following Care Advice  Patient sounds very sick or weak to the triager  Answer Assessment - Initial Assessment Questions 1. SYMPTOM: "What's the main symptom you're concerned about?"  (e.g., lump, pain, rash, nipple discharge)     Left breast pain near the nipple 2. LOCATION: "Where is the pain located?"     Left breast near the nipple 3. ONSET: "When did pain  start?"     Last night 4. PRIOR HISTORY: "Do you have any history of prior problems with your breasts?" (  e.g., lumps, cancer, fibrocystic breast disease)     No 5. CAUSE: "What do you think is causing this symptom?"     Unknown 6. OTHER SYMPTOMS: "Do you have any other symptoms?" (e.g., fever, breast pain, redness or rash, nipple discharge)     Maybe 3 days ago it seemed like 3 drops of clear fluid came out 7. PREGNANCY-BREASTFEEDING: "Is there any chance you are pregnant?" "When was your last menstrual period?" "Are you breastfeeding?"     No--Hysterectomy 3 years ago  Protocols used:  Vaginal Symptoms-A-AH, Breast Symptoms-A-AH, Diabetes - High Blood Sugar-A-AH

## 2023-11-10 ENCOUNTER — Other Ambulatory Visit (HOSPITAL_BASED_OUTPATIENT_CLINIC_OR_DEPARTMENT_OTHER): Payer: Self-pay

## 2023-11-10 ENCOUNTER — Ambulatory Visit (HOSPITAL_BASED_OUTPATIENT_CLINIC_OR_DEPARTMENT_OTHER): Payer: Self-pay | Admitting: Family Medicine

## 2023-11-10 ENCOUNTER — Encounter (HOSPITAL_BASED_OUTPATIENT_CLINIC_OR_DEPARTMENT_OTHER): Payer: Self-pay | Admitting: Family Medicine

## 2023-11-10 DIAGNOSIS — E785 Hyperlipidemia, unspecified: Secondary | ICD-10-CM

## 2023-11-10 DIAGNOSIS — I152 Hypertension secondary to endocrine disorders: Secondary | ICD-10-CM

## 2023-11-10 DIAGNOSIS — B3731 Acute candidiasis of vulva and vagina: Secondary | ICD-10-CM

## 2023-11-10 DIAGNOSIS — E1169 Type 2 diabetes mellitus with other specified complication: Secondary | ICD-10-CM

## 2023-11-10 DIAGNOSIS — E1165 Type 2 diabetes mellitus with hyperglycemia: Secondary | ICD-10-CM

## 2023-11-10 DIAGNOSIS — E1159 Type 2 diabetes mellitus with other circulatory complications: Secondary | ICD-10-CM

## 2023-11-10 DIAGNOSIS — Z794 Long term (current) use of insulin: Secondary | ICD-10-CM

## 2023-11-10 MED ORDER — NYSTATIN 100000 UNIT/GM EX POWD
1.0000 | Freq: Three times a day (TID) | CUTANEOUS | 5 refills | Status: DC | PRN
  Filled 2023-11-10: qty 30, 10d supply, fill #0

## 2023-11-10 MED ORDER — NYSTATIN-TRIAMCINOLONE 100000-0.1 UNIT/GM-% EX OINT
1.0000 | TOPICAL_OINTMENT | Freq: Two times a day (BID) | CUTANEOUS | 3 refills | Status: DC
  Filled 2023-11-10: qty 15, 8d supply, fill #0

## 2023-11-10 MED ORDER — FLUCONAZOLE 150 MG PO TABS
150.0000 mg | ORAL_TABLET | Freq: Once | ORAL | 0 refills | Status: AC
  Filled 2023-11-10: qty 2, 2d supply, fill #0

## 2023-11-10 NOTE — Progress Notes (Signed)
 Acute Care Office Visit  Subjective:   Alexandra Norris 1976/01/20 11/10/2023  Chief Complaint  Patient presents with   Vaginal Itching    Patient has been having problems with vaginal itching for about 2 weeks now. States this is the same problem that she had prior.   Due to language barrier, a medical interpreter was present during the HPI, ROS, and discussion for the plan of care.  Interpreter: Norita Beauvais, Spanish Interpreter    HPI: VAGINAL DISCHARGE: Onset: 2 weeks    Description: Pt reports itching and white discharge in her "private parts" and thinks it is related to high blood sugars.    Symptoms Odor:  no   Itching: yes  Dysuria: yes  Bleeding: yes  Pelvic pain: no  Back pain: no  Fever: no  Genital sores: no  Rash: no  Dyspareunia: no  Prior treatment: yes, states she bought a cream at the pharmacy that did not provide relief while using   BREAST CONCERN:  Pt was seen by [facility] last week for possible breast infection, states she has been taking abx for breast infection, states she has three more days left before abx tx is complete.   Red Flags: Recent antibiotics: yes  Sexual activity: no  Possible STD exposure: no  Diabetes: yes     The following portions of the patient's history were reviewed and updated as appropriate: past medical history, past surgical history, family history, social history, allergies, medications, and problem list.   Patient Active Problem List   Diagnosis Date Noted   Family history of colon cancer 05/25/2023   Benign neoplasm of colon 05/25/2023   Acute intractable headache 03/31/2023   Type 2 diabetes mellitus with hyperglycemia, with long-term current use of insulin  (HCC) 07/28/2022   Asthma due to seasonal allergies 09/25/2021   Arthritis of right acromioclavicular joint 04/24/2021   Chronic left shoulder pain 04/22/2021   Hypertension associated with diabetes (HCC) 04/22/2021   Hyperlipidemia  associated with type 2 diabetes mellitus (HCC) 04/22/2021   Fatty liver disease, nonalcoholic 09/09/2020   Vitamin D  deficiency 09/25/2015   Anxiety and depression 07/06/2014   GASTROESOPHAGEAL REFLUX DISEASE 08/14/2009   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 level with radiculopathy 04/03/2020   Cervical post-laminectomy syndrome 03/06/2022   Cubital tunnel syndrome on right 12/02/2021   Depression    DEPRESSION, SITUATIONAL, PROLONGED 02/05/2009   Qualifier: Diagnosis of  By: Leilani Punter  MD, Khary     Diabetes mellitus without complication (HCC)    Dizziness 04/22/2021   Dyspnea    Elevated blood-pressure reading, without diagnosis of hypertension 06/17/2020   Endometriosis    Fatigue 04/22/2021   History of blood transfusion 1999   w/vaginal delivery   History of bronchitis    "I've stayed in hospital 2 X w/bronchitis"   Hyperlipidemia    Hypertension    Impingement syndrome of left shoulder    Influenza vaccine needed 04/03/2020   Left carpal tunnel syndrome 07/11/2019   Migraine    Moderate persistent asthma with acute exacerbation 12/06/2006   Qualifier: Diagnosis of  By: Leilani Punter  MD, Khary     Nasal obstruction 02/18/2016   Formatting of this note might be different from the original.  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   Pre-diabetes 10/03/2019   A1C 6.4   Right carpal tunnel syndrome 07/11/2019   S/P arthroscopy of right shoulder 07/08/2021   S/P carpal tunnel release 08/30/2019   Seasonal allergies 10/08/2016   Shingles 11/14/2020   Sinusitis, bacterial 10/07/2021   Snoring 01/20/2022   Past Surgical History:  Procedure Laterality Date   ABDOMINAL HYSTERECTOMY   10/17/2019   APPENDECTOMY     CARPAL TUNNEL RELEASE Right 07/19/2019   Procedure: RIGHT CARPAL TUNNEL RELEASE;  Surgeon: Wes Hamman, MD;  Location: [facility];  Service: Orthopedics;  Laterality: Right;   CARPAL TUNNEL RELEASE Left 09/13/2019   Procedure: LEFT CARPAL TUNNEL RELEASE;  Surgeon: Wes Hamman, MD;  Location: [facility];  Service: Orthopedics;  Laterality: Left;  Bier block   CESAREAN SECTION  1997; 2008   CHOLECYSTECTOMY  2011   COLONOSCOPY WITH PROPOFOL  N/A 05/25/2023   Procedure: COLONOSCOPY WITH PROPOFOL ;  Surgeon: Ace Holder, MD;  Location: [facility];  Service: Gastroenterology;  Laterality: N/A;   POLYPECTOMY  05/25/2023   Procedure: POLYPECTOMY;  Surgeon: Ace Holder, MD;  Location: [facility];  Service: Gastroenterology;;   SHOULDER ARTHROSCOPY WITH DISTAL CLAVICLE RESECTION  02/12/2021   Procedure: SHOULDER ARTHROSCOPY WITH DISTAL CLAVICLE RESECTION;  Surgeon: Wes Hamman, MD;  Location: [facility];  Service: Orthopedics;;   SHOULDER ARTHROSCOPY WITH ROTATOR CUFF REPAIR AND SUBACROMIAL DECOMPRESSION Left 02/12/2021   Procedure: LEFT SHOULDER ARTHROSCOPY WITH ROTATOR CUFF REPAIR, SUBACROMIAL DECOMPRESSION, EXTENSIVE DEBRIDEMENT;  Surgeon: Wes Hamman, MD;  Location: [facility];  Service: Orthopedics;  Laterality: Left;   TONSILLECTOMY     tonsilletomy     TUBAL LIGATION  04/2007   ULNAR TUNNEL RELEASE Right 12/17/2021   Procedure: RIGHT CUBITAL TUNNEL RELEASE;  Surgeon: Wes Hamman, MD;  Location: [facility];  Service: Orthopedics;  Laterality: Right;   VAGINAL HYSTERECTOMY Bilateral 10/17/2019   Procedure: HYSTERECTOMY VAGINAL WITH SALPINGECTOMY;  Surgeon: Othelia Blinks, MD;  Location: [facility];  Service: Gynecology;  Laterality: Bilateral;   Family History  Problem Relation Age of Onset   Diabetes Mother    Osteoarthritis Mother    Hypertension Mother     Hyperlipidemia Mother    Arthritis Mother    Colon cancer Mother    Colon polyps Neg Hx    Esophageal cancer Neg Hx    Stomach cancer Neg Hx    Ulcerative colitis Neg Hx    Breast cancer Neg Hx    Outpatient Medications Prior to Visit  Medication Sig Dispense Refill   albuterol  (VENTOLIN  HFA) 108 (90 Base) MCG/ACT inhaler Inhale 2 puffs into the lungs every 6 (six) hours as needed for wheezing or shortness of breath. 6.7 g 0   butalbital -acetaminophen -caffeine  (FIORICET ) 50-325-40 MG tablet Take 1 tablet by mouth every 6 (six) hours as needed for headache. 14 tablet 0   Continuous Glucose Sensor (FREESTYLE LIBRE 14 DAY SENSOR) MISC Apply upper deltoid every 14 days, use reader to determine blood sugars. 2 each 11   diltiazem  2 % GEL With 5% lidocaine . Apply to rectum 3 times daily for 6-8 weeks. 30 g 1   glipiZIDE  (GLUCOTROL ) 5 MG tablet Take 1 tablet (5 mg total) by mouth daily before breakfast. 30 tablet 1   ibuprofen  (ADVIL ) 800 MG  tablet Take 1 tablet (800 mg total) by mouth every 8 (eight) hours as needed. 30 tablet 0   insulin  degludec (TRESIBA  FLEXTOUCH) 100 UNIT/ML FlexTouch Pen Inject 30 Units into the skin daily. 9 mL 3   Insulin  Pen Needle (1ST TIER UNIFINE PENTIPS) 32G X 4 MM MISC Use as directed with Tresiba  Flextouch pen 100 each 12   metFORMIN  (GLUCOPHAGE ) 500 MG tablet Take 2 tablets (1,000 mg total) by mouth daily with breakfast AND 2 tablets (1,000 mg total) every evening. 270 tablet 3   Multiple Vitamin (MULTIVITAMIN WITH MINERALS) TABS tablet Take 1 tablet by mouth daily in the afternoon.     nystatin -triamcinolone  ointment (MYCOLOG) Apply 1 Application topically 2 (two) times daily. 60 g 3   FLUoxetine  (PROZAC ) 10 MG capsule Take 1 capsule (10 mg total) by mouth daily. (Patient not taking: Reported on 11/10/2023) 30 capsule 3   doxycycline  (VIBRAMYCIN ) 100 MG capsule Take 1 capsule (100 mg total) by mouth 2 (two) times daily. 20 capsule 0   No facility-administered  medications prior to visit.   No Known Allergies   ROS: A complete ROS was performed with pertinent positives/negatives noted in the HPI. The remainder of the ROS are negative.    Objective:   Today's Vitals   11/10/23 1022  BP: 106/84  Pulse: 92  SpO2: 97%  Weight: 230 lb (104.3 kg)  Height: 5\' 1"  (1.549 m)    GENERAL: Well-appearing, in NAD. Well nourished.  SKIN: Pink, warm and dry. No rash, lesion, ulceration, or ecchymoses.  Head: Normocephalic. NECK: Trachea midline. Full ROM w/o pain or tenderness.  BREASTS: Breasts pendulous, symmetrical, and w/o palpable masses. Nipples everted and w/o discharge. No rash or skin retraction. No axillary or supraclavicular lymphadenopathy. Chaperoned by Therisa Flatten, DNP Student, RN, BSN.   RESPIRATORY: Chest wall symmetrical. Respirations even and non-labored. Breath sounds clear to auscultation bilaterally.  CARDIAC: S1, S2 present, regular rate and rhythm without murmur or gallops. Peripheral pulses 2+ bilaterally.  GU: External genitalia with moderate erythema. No lesions, or masses. No lymphadenopathy. Vaginal mucosa pink and moist with white exudate.. No, lesions, or ulcerations. Chaperoned by Therisa Flatten, DNP Student, RN, BSN.  NEUROLOGIC: No motor or sensory deficits. Steady, even gait. C2-C12 intact.  PSYCH/MENTAL STATUS: Alert, oriented x 3. Cooperative, appropriate mood and affect.    No results found for any visits on 11/10/23.    Assessment & Plan:  1. Vulvovaginal candidiasis Likely due to uncontrolled Type 2 DM and recent abx use. Educated on prevention, sent in diflucan , nystatin -triamcinolone  cream and nystatin  powder for pt use. If no improvement in 1wk reach out to PCP.  - nystatin -triamcinolone  ointment (MYCOLOG); Apply 1 Application topically 2 (two) times daily.  Dispense: 60 g; Refill: 3 - nystatin  (MYCOSTATIN /NYSTOP ) powder; Apply 1 Application topically 3 (three) times daily as needed (for moisture and vaginal  yeast recurrence .).  Dispense: 30 g; Refill: 5  2. Type 2 diabetes mellitus with hyperglycemia, with long-term current use of insulin  (HCC) Uncontrolled. Pt obtained labs today for upcoming office visit.  - Hemoglobin A1c  3. Hypertension associated with diabetes (HCC) Labs obtained for upcoming office visit.  - Comprehensive metabolic panel  4. Hyperlipidemia associated with type 2 diabetes mellitus (HCC) Labs obtained for upcoming office visit.  - Lipid panel   Meds ordered this encounter  Medications   fluconazole  (DIFLUCAN ) 150 MG tablet    Sig: Take 1 tablet (150 mg total) by mouth once for 1 dose. May repeat after  3 days if needed.    Dispense:  2 tablet    Refill:  0    Supervising Provider:   DE Peru, RAYMOND J [[id number]]   nystatin -triamcinolone  ointment (MYCOLOG)    Sig: Apply 1 Application topically 2 (two) times daily.    Dispense:  60 g    Refill:  3    Supervising Provider:   DE [place], RAYMOND J [[id number]]   nystatin  (MYCOSTATIN /NYSTOP ) powder    Sig: Apply 1 Application topically 3 (three) times daily as needed (for moisture and vaginal yeast recurrence .).    Dispense:  30 g    Refill:  5    Supervising Provider:   DE Peru, RAYMOND J [[id number]]   Lab Orders  No laboratory test(s) ordered today   No images are attached to the encounter or orders placed in the encounter.  Return for as scheduled in July.    Patient to reach out to office if new, worrisome, or unresolved symptoms arise or if no improvement in patient's condition. Patient verbalized understanding and is agreeable to treatment plan. All questions answered to patient's satisfaction.    Nonda Bays, [place]

## 2023-11-11 ENCOUNTER — Other Ambulatory Visit (HOSPITAL_BASED_OUTPATIENT_CLINIC_OR_DEPARTMENT_OTHER): Payer: Self-pay | Admitting: Family Medicine

## 2023-11-11 ENCOUNTER — Ambulatory Visit (HOSPITAL_BASED_OUTPATIENT_CLINIC_OR_DEPARTMENT_OTHER): Payer: Self-pay | Admitting: Family Medicine

## 2023-11-11 DIAGNOSIS — E1165 Type 2 diabetes mellitus with hyperglycemia: Secondary | ICD-10-CM

## 2023-11-11 LAB — COMPREHENSIVE METABOLIC PANEL WITH GFR
ALT: 44 IU/L — ABNORMAL HIGH (ref 0–32)
AST: 51 IU/L — ABNORMAL HIGH (ref 0–40)
Albumin: 4.5 g/dL (ref 3.9–4.9)
Alkaline Phosphatase: 175 IU/L — ABNORMAL HIGH (ref 44–121)
BUN/Creatinine Ratio: 16 (ref 9–23)
BUN: 8 mg/dL (ref 6–24)
Bilirubin Total: 0.8 mg/dL (ref 0.0–1.2)
CO2: 18 mmol/L — ABNORMAL LOW (ref 20–29)
Calcium: 9.2 mg/dL (ref 8.7–10.2)
Chloride: 100 mmol/L (ref 96–106)
Creatinine, Ser: 0.49 mg/dL — ABNORMAL LOW (ref 0.57–1.00)
Globulin, Total: 2.7 g/dL (ref 1.5–4.5)
Glucose: 339 mg/dL — ABNORMAL HIGH (ref 70–99)
Potassium: 4.2 mmol/L (ref 3.5–5.2)
Sodium: 136 mmol/L (ref 134–144)
Total Protein: 7.2 g/dL (ref 6.0–8.5)
eGFR: 117 mL/min/{1.73_m2} (ref >59)

## 2023-11-11 LAB — LIPID PANEL
Chol/HDL Ratio: 4.1 ratio (ref 0.0–4.4)
Cholesterol, Total: 208 mg/dL — ABNORMAL HIGH (ref 100–199)
HDL: 51 mg/dL (ref >39)
LDL Chol Calc (NIH): 139 mg/dL — ABNORMAL HIGH (ref 0–99)
Triglycerides: 100 mg/dL (ref 0–149)
VLDL Cholesterol Cal: 18 mg/dL (ref 5–40)

## 2023-11-11 LAB — HEMOGLOBIN A1C
Est. average glucose Bld gHb Est-mCnc: 329 mg/dL
Hgb A1c MFr Bld: 13.1 % — ABNORMAL HIGH (ref 4.8–5.6)

## 2023-11-11 MED ORDER — GLIPIZIDE ER 5 MG PO TB24
5.0000 mg | ORAL_TABLET | Freq: Every day | ORAL | 3 refills | Status: DC
  Filled 2023-11-11: qty 30, 30d supply, fill #0

## 2023-11-11 MED ORDER — TRESIBA FLEXTOUCH 100 UNIT/ML ~~LOC~~ SOPN
20.0000 [IU] | PEN_INJECTOR | Freq: Two times a day (BID) | SUBCUTANEOUS | 3 refills | Status: DC
Start: 2023-11-11 — End: 2023-11-26
  Filled 2023-11-11: qty 9, 23d supply, fill #0

## 2023-11-11 NOTE — Progress Notes (Signed)
 Please call patient using Spanish interpreter letting her know the following:  Her diabetes is very uncontrolled at 13.1 her average glucose is likely over 250. I have sent in a new prescription of Glipizide  5mg  XR for her to take instead of her Glipizide  5mg  IR she currently has. This is an extended release. Additionally, I have sent in her Insulin  degludec (tresiba ) to have her inject 20 units in the morning and 20 units in the evening. I have placed a referral to [facility] for pharmacy management to see if she could afford a GLP1 medication and to help titrate her insulin  and keep glucose controlled. They will be contacting her. She needs to make dietary changes for her diabetes and continue to wear her Freestyle Libre to monitor for high and low blood sugar episodes. If she has questions, please let me know. I want to see her back in 3 months.

## 2023-11-12 ENCOUNTER — Other Ambulatory Visit (HOSPITAL_BASED_OUTPATIENT_CLINIC_OR_DEPARTMENT_OTHER): Payer: Self-pay

## 2023-11-16 ENCOUNTER — Encounter (HOSPITAL_BASED_OUTPATIENT_CLINIC_OR_DEPARTMENT_OTHER): Payer: Self-pay | Admitting: *Deleted

## 2023-11-16 ENCOUNTER — Telehealth: Payer: Self-pay

## 2023-11-16 ENCOUNTER — Ambulatory Visit: Payer: Self-pay

## 2023-11-16 NOTE — Telephone Encounter (Signed)
 Alexandra Norris, please advise if pt still needs to be seen in July or if that visit can be cancelled for pt just to be seen in September.

## 2023-11-16 NOTE — Telephone Encounter (Signed)
 FYI Only or Action Required?: FYI only for provider  Patient was last seen in primary care on 11/10/2023 by Nonda Bays, FNP. Called Nurse Triage reporting lab results. Symptoms began today. Interventions attempted: Other: n/a. Symptoms are: n/a.  Triage Disposition: Information or Advice Only Call lab results read, follow up scheduled  Patient/caregiver understands and will follow disposition?: Yes  Copied from CRM [id number]. Topic: Clinical - Lab/Test Results >> Nov 16, 2023  1:38 PM Baldomero Bone wrote: Reason for CRM: Patient is returning a call for lab results. Reason for Disposition  Health Information question, no triage required and triager able to answer question  Answer Assessment - Initial Assessment Questions 1. REASON FOR CALL or QUESTION: "What is your reason for calling today?" or "How can I best help you?" or "What question do you have that I can help answer?"     Please call patient using Spanish interpreter letting her know the following: Her diabetes is very uncontrolled at 13.1 her average glucose is likely over 250. I have sent in a new prescription of Glipizide  5mg  XR for her to take instead of her Glipizide  5mg  IR she currently has. This is an extended release. Additionally, I have sent in her Insulin  degludec (tresiba ) to have her inject 20 units in the morning and 20 units in the evening. I have placed a referral to [facility] for pharmacy management to see if she could afford a GLP1 medication and to help titrate her insulin  and keep glucose controlled. They will be contacting her. She needs to make dietary changes for her diabetes and continue to wear her Freestyle Libre to monitor for high and low blood sugar episodes. If she has questions, please let me know. I want to see her back in 3 months.  Protocols used: Information Only Call - No Triage-A-AH

## 2023-11-16 NOTE — Progress Notes (Signed)
 Complex Care Management Note Care Guide Note  11/16/2023 Name: Alexandra Norris MRN: [id number] DOB: [birth year]   Complex Care Management Outreach Attempts: An unsuccessful telephone outreach was attempted today to offer the patient information about available complex care management services.  Follow Up Plan:  Additional outreach attempts will be made to offer the patient complex care management information and services.   Encounter Outcome:  No Answer  [facility]  [facility], [facility] Health Care Management Assistant Direct Dial: [phone]  Fax: [phone]

## 2023-11-18 NOTE — Progress Notes (Signed)
 Complex Care Management Note Care Guide Note  11/18/2023 Name: Alexandra Norris MRN: [id number] DOB: Dec 07, 1975   Complex Care Management Outreach Attempts: A second unsuccessful outreach was attempted today to offer the patient with information about available complex care management services.  Follow Up Plan:  Additional outreach attempts will be made to offer the patient complex care management information and services.   Encounter Outcome:  No Answer  [facility]  [facility], [facility] Health Care Management Assistant Direct Dial: [phone]  Fax: [phone]

## 2023-11-25 NOTE — Progress Notes (Signed)
 Complex Care Management Note  Care Guide Note 11/25/2023 Name: Alexandra Norris MRN: [id number] DOB: 10-17-1975  Alexandra Norris is a 48 y.o. year old female who sees Caudle, Arcola Kocher, FNP for primary care. I reached out to Landry Pinks by phone today to offer complex care management services.  Alexandra Norris was given information about Complex Care Management services today including:   The Complex Care Management services include support from the care team which includes your Nurse Care Manager, Clinical Social Worker, or Pharmacist.  The Complex Care Management team is here to help remove barriers to the health concerns and goals most important to you. Complex Care Management services are voluntary, and the patient may decline or stop services at any time by request to their care team member.   Complex Care Management Consent Status: Patient agreed to services and verbal consent obtained.   Follow up plan:  Telephone appointment with complex care management team member scheduled for:  11/26/23 at 10:00 a.m.   Encounter Outcome:  Patient Scheduled  [facility]  [facility], [facility] Health Care Management Assistant Direct Dial: [phone]  Fax: [phone]

## 2023-11-26 ENCOUNTER — Other Ambulatory Visit: Payer: Self-pay

## 2023-11-26 DIAGNOSIS — E1165 Type 2 diabetes mellitus with hyperglycemia: Secondary | ICD-10-CM

## 2023-11-26 NOTE — Progress Notes (Unsigned)
 11/26/2023 Name: Alexandra Norris MRN: [id number] DOB: 02-19-1976  Chief Complaint  Patient presents with   Diabetes    Alexandra Norris is a 48 y.o. year old female who presented for a telephone visit.   They were referred to the pharmacist by their PCP for assistance in managing diabetes, specifically medication adherence and asssitance.    Subjective:  Care Team: Primary Care Provider: Nonda Bays, FNP ; Next Scheduled Visit: 12/08/2023   Medication Access/Adherence  Current Pharmacy:  [facility] [address] [place] [address] Phone: [phone] Fax: [phone]  [facility] [address] [place] - [address] [place] [address] Phone: [phone] Fax: [phone]  [facility] [address] [place] ([place]), [place] - [address] [place] [address] Phone: [phone] Fax: [phone]   Patient reports affordability concerns with their medications: Yes   - Patient confirmed she does not have insurance. The last time she picked her medications it was $160 including CGM and with device it is ~$60. Although she receives medications at [facility], patient does not recall applying for their [facility] program. Patient declined Medicaid for personal reasons. She is interested in [facility] program. She confirmed she receives Tresiba  through patient assistance program in which she pays $30 for three injections (three pens), but the approval letter is not in Epic.   Patient reports access/transportation concerns to their pharmacy: No  Patient reports adherence concerns with their medications:  Yes  Sometimes in the morning since she may forget to eat but not evening; oral medications. Discussed metformin  needs to be taken with food.    Diabetes:  Current medications: Tresiba  20 units twice  daily - previous Tresiba  30 units daily   Current glucose readings:  Fasting 260-270 mg/dL PPBG [id number] Using BGM meter (she doesn't know the name); testing two times daily -- Last FreeStyle Libre sensor ran out last week and she does not have the money to buy  Date of Download: Requested patient information; awaiting data sharing    Patient reports hypoglycemic s/sx including dizziness, shakiness, sweating. Reviewed to check BG and BP when she endorses sxs. Patient reports hyperglycemic symptoms including nocturia, neuropathy.  Current meal patterns: salad, lots fruit, meat, veggies, rice, beans, some potatoes (sweet and white potatoes) - Drinks: unsweetened tea  Current physical activity: walks 2-miles, 4 times per week    Hyperlipidemia/ASCVD Risk Reduction  Current lipid lowering medications: None  Medications tried in the past:    The 10-year ASCVD risk score (Arnett DK, et al., 2019) is: 1.5%   Values used to calculate the score:     Age: 82 years     Clincally relevant sex: Female     Is Non-Hispanic African American: No     Diabetic: Yes     Tobacco smoker: No     Systolic Blood Pressure: 106 mmHg     Is BP treated: No     HDL Cholesterol: 51 mg/dL     Total Cholesterol: 208 mg/dL    Objective:  Lab Results  Component Value Date   HGBA1C 13.1 (H) 11/10/2023    Lab Results  Component Value Date   CREATININE 0.49 (L) 11/10/2023   BUN 8 11/10/2023   NA 136 11/10/2023   K 4.2 11/10/2023   CL 100 11/10/2023   CO2 18 (L) 11/10/2023    Lab Results  Component Value Date  CHOL 208 (H) 11/10/2023   HDL 51 11/10/2023   LDLCALC 139 (H) 11/10/2023   TRIG 100 11/10/2023   CHOLHDL 4.1 11/10/2023    Medications Reviewed Today     Reviewed by Amedeo Bailiff, RPH (Pharmacist) on 11/26/23 at 1028  Med List Status: <None>   Medication Order Taking? Sig Documenting Provider Last Dose Status Informant  albuterol  (VENTOLIN  HFA) 108 (90 Base) MCG/ACT inhaler  [id number] Yes Inhale 2 puffs into the lungs every 6 (six) hours as needed for wheezing or shortness of breath. de [place], Raymond J, MD  Active     Discontinued 10/13/23 1427 (Patient Preference)   butalbital -acetaminophen -caffeine  (FIORICET ) 50-325-40 MG tablet [id number] Yes Take 1 tablet by mouth every 6 (six) hours as needed for headache. Wilhelmena Hanson, FNP  Active   Continuous Glucose Sensor (FREESTYLE LIBRE 14 DAY SENSOR) [place] [id number] Yes Apply upper deltoid every 14 days, use reader to determine blood sugars. Nonda Bays, FNP  Active   diltiazem  2 % GEL [id number]  With 5% lidocaine . Apply to rectum 3 times daily for 6-8 weeks.  Patient not taking: Reported on 11/26/2023   Suzanna Erp M, PA-C  Active   FLUoxetine  (PROZAC ) 10 MG capsule [id number] Yes Take 1 capsule (10 mg total) by mouth daily. Nonda Bays, FNP  Active   glipiZIDE  (GLUCOTROL  XL) 5 MG 24 hr tablet [id number] Yes Take 1 tablet (5 mg total) by mouth daily with breakfast. Nonda Bays, FNP  Active   ibuprofen  (ADVIL ) 800 MG tablet [id number] Yes Take 1 tablet (800 mg total) by mouth every 8 (eight) hours as needed.   Active   insulin  degludec (TRESIBA  FLEXTOUCH) 100 UNIT/ML FlexTouch Pen [id number] Yes Inject 20 Units into the skin 2 (two) times daily. Nonda Bays, FNP  Active   Insulin  Pen Needle (1ST TIER UNIFINE PENTIPS) 32G X 4 MM MISC [id number] Yes Use as directed with Tresiba  Flextouch pen Wilhelmena Hanson, FNP  Active   metFORMIN  (GLUCOPHAGE ) 500 MG tablet [id number] Yes Take 2 tablets (1,000 mg total) by mouth daily with breakfast AND 2 tablets (1,000 mg total) every evening. Wilhelmena Hanson, FNP  Active   Multiple Vitamin (MULTIVITAMIN WITH MINERALS) TABS tablet [id number] Yes Take 1 tablet by mouth daily in the afternoon. [provider]  Active Self  nystatin  (MYCOSTATIN /NYSTOP ) powder [id number]  Apply 1 Application topically 3 (three) times daily as needed (for moisture  and vaginal yeast recurrence .).  Patient not taking: Reported on 11/26/2023   Nonda Bays, FNP  Active   nystatin -triamcinolone  ointment [facility]) [id number]  Apply 1 Application topically 2 (two) times daily.  Patient not taking: Reported on 11/26/2023   Nonda Bays, FNP  Active               Assessment/Plan:   Diabetes: - Currently uncontrolled likely due to diet. She reports that she is on an assistance program for Tresiba  in which she pays $30 for three pens.  - Reviewed long term cardiovascular and renal outcomes of uncontrolled blood sugar - Reviewed goal A1c, goal fasting, and goal 2 hour post prandial glucose - Reviewed dietary modifications including: more protein rich food, low carbohydrate foods _ Will send plate method ad check to see if GLP1 is appropriate in gallbladdr being removed  as patient is interesed in Ozempic  - Reviewed lifestyle modifications including:  - Recommend to INCREASE Tresiba  to  24 units twice daily - Patient denies personal or family history of multiple endocrine neoplasia  type 2, medullary thyroid cancer; personal history of pancreatitis. She reports her gallbladder was removed about 14 years ago.  - Will collaborate with PCP on test strips and lancets to [employer]. Discussed with patient the bring in device to make sure the correct test strips and lancets are dispensed.  - Recommend to check glucose twice daily (FBG, PPBG) - Discussed [facility] program and patient is interested. Patient informed of application process.      Hyperlipidemia/ASCVD Risk Reduction: - Currently uncontrolled. Patients LFTs are abnomral  - Reviewed long term complications of uncontrolled cholesterol    Follow Up Plan: ***  *** To do: DOH application - explained process to patient   Tresiba  24 units BID (increase)

## 2023-11-29 ENCOUNTER — Other Ambulatory Visit (HOSPITAL_BASED_OUTPATIENT_CLINIC_OR_DEPARTMENT_OTHER): Payer: Self-pay

## 2023-11-29 ENCOUNTER — Other Ambulatory Visit: Payer: Self-pay

## 2023-11-29 ENCOUNTER — Ambulatory Visit: Payer: Self-pay | Admitting: Gastroenterology

## 2023-11-29 MED ORDER — TRESIBA FLEXTOUCH 100 UNIT/ML ~~LOC~~ SOPN
24.0000 [IU] | PEN_INJECTOR | Freq: Two times a day (BID) | SUBCUTANEOUS | 1 refills | Status: DC
  Filled 2023-11-29: qty 9, 19d supply, fill #0

## 2023-11-29 MED ORDER — BLOOD GLUCOSE TEST VI STRP
ORAL_STRIP | 0 refills | Status: AC
  Filled 2023-11-29: qty 100, fill #0

## 2023-11-29 MED ORDER — LANCETS MISC. MISC
0 refills | Status: AC
  Filled 2023-11-29: qty 100, fill #0

## 2023-11-29 NOTE — Progress Notes (Deleted)
 Chief Complaint: Primary GI MD:  HPI:  *** is a  ***  who was referred to me by Knute Thersia Bitters, * for a complaint of *** .     Discussed the use of AI scribe software for clinical note transcription with the patient, who gave verbal consent to proceed.  History of Present Illness      PREVIOUS GI WORKUP   Colonoscopy 05/2023 - One 4 mm polyp at the hepatic flexure, removed with a cold snare. Resected and retrieved. - Internal hemorrhoids. - The examination was otherwise normal.  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 level with radiculopathy 04/03/2020   Cervical post-laminectomy syndrome 03/06/2022   Cubital tunnel syndrome on right 12/02/2021   Depression    DEPRESSION, SITUATIONAL, PROLONGED 02/05/2009   Qualifier: Diagnosis of  By: Reynaldo  MD, Khary     Diabetes mellitus without complication (HCC)    Dizziness 04/22/2021   Dyspnea    Elevated blood-pressure reading, without diagnosis of hypertension 06/17/2020   Endometriosis    Fatigue 04/22/2021   History of blood transfusion 1999   w/vaginal delivery   History of bronchitis    I've stayed in hospital 2 X w/bronchitis   Hyperlipidemia    Hypertension    Impingement syndrome of left shoulder    Influenza vaccine needed 04/03/2020   Left carpal tunnel syndrome 07/11/2019   Migraine    Moderate persistent asthma with acute exacerbation 12/06/2006   Qualifier: Diagnosis of  By: Reynaldo  MD, Lucita     Nasal obstruction 02/18/2016   Formatting of this note might be different from the original.  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   Pre-diabetes 10/03/2019   A1C 6.4   Right carpal tunnel syndrome 07/11/2019    S/P arthroscopy of right shoulder 07/08/2021   S/P carpal tunnel release 08/30/2019   Seasonal allergies 10/08/2016   Shingles 11/14/2020   Sinusitis, bacterial 10/07/2021   Snoring 01/20/2022    Past Surgical History:  Procedure Laterality Date   ABDOMINAL HYSTERECTOMY  10/17/2019   APPENDECTOMY     CARPAL TUNNEL RELEASE Right 07/19/2019   Procedure: RIGHT CARPAL TUNNEL RELEASE;  Surgeon: Jerri Kay HERO, MD;  Location: [facility];  Service: Orthopedics;  Laterality: Right;   CARPAL TUNNEL RELEASE Left 09/13/2019   Procedure: LEFT CARPAL TUNNEL RELEASE;  Surgeon: Jerri Kay HERO, MD;  Location: [facility];  Service: Orthopedics;  Laterality: Left;  Bier block   CESAREAN SECTION  1997; 2008   CHOLECYSTECTOMY  2011   COLONOSCOPY WITH PROPOFOL  N/A 05/25/2023   Procedure: COLONOSCOPY WITH PROPOFOL ;  Surgeon: Leigh Elspeth SQUIBB, MD;  Location: [facility];  Service: Gastroenterology;  Laterality: N/A;   POLYPECTOMY  05/25/2023   Procedure: POLYPECTOMY;  Surgeon: Leigh Elspeth SQUIBB, MD;  Location: [facility];  Service: Gastroenterology;;   SHOULDER ARTHROSCOPY WITH DISTAL CLAVICLE RESECTION  02/12/2021   Procedure: SHOULDER ARTHROSCOPY WITH DISTAL CLAVICLE RESECTION;  Surgeon: Jerri Kay HERO, MD;  Location: [facility];  Service: Orthopedics;;   SHOULDER ARTHROSCOPY WITH ROTATOR CUFF REPAIR AND SUBACROMIAL DECOMPRESSION Left 02/12/2021   Procedure:  LEFT SHOULDER ARTHROSCOPY WITH ROTATOR CUFF REPAIR, SUBACROMIAL DECOMPRESSION, EXTENSIVE DEBRIDEMENT;  Surgeon: Jerri Kay HERO, MD;  Location: [facility];  Service: Orthopedics;  Laterality: Left;   TONSILLECTOMY     tonsilletomy     TUBAL LIGATION  04/2007   ULNAR TUNNEL RELEASE Right 12/17/2021   Procedure: RIGHT CUBITAL TUNNEL RELEASE;  Surgeon: Jerri Kay HERO, MD;  Location: [facility];  Service: Orthopedics;  Laterality: Right;   VAGINAL HYSTERECTOMY Bilateral 10/17/2019    Procedure: HYSTERECTOMY VAGINAL WITH SALPINGECTOMY;  Surgeon: Lorence Ozell CROME, MD;  [facility];  Service: Gynecology;  Laterality: Bilateral;    Current Outpatient Medications  Medication Sig Dispense Refill   albuterol  (VENTOLIN  HFA) 108 (90 Base) MCG/ACT inhaler Inhale 2 puffs into the lungs every 6 (six) hours as needed for wheezing or shortness of breath. 6.7 g 0   butalbital -acetaminophen -caffeine  (FIORICET ) 50-325-40 MG tablet Take 1 tablet by mouth every 6 (six) hours as needed for headache. 14 tablet 0   Continuous Glucose Sensor (FREESTYLE LIBRE 14 DAY SENSOR) MISC Apply upper deltoid every 14 days, use reader to determine blood sugars. 2 each 11   diltiazem  2 % GEL With 5% lidocaine . Apply to rectum 3 times daily for 6-8 weeks. (Patient not taking: Reported on 11/26/2023) 30 g 1   FLUoxetine  (PROZAC ) 10 MG capsule Take 1 capsule (10 mg total) by mouth daily. 30 capsule 3   glipiZIDE  (GLUCOTROL  XL) 5 MG 24 hr tablet Take 1 tablet (5 mg total) by mouth daily with breakfast. 30 tablet 3   Glucose Blood (BLOOD GLUCOSE TEST STRIPS) STRP Use to check blood sugar twice daily. May substitute to any manufacturer covered by patient's insurance. Patient doesn't know name of glucometer 100 strip 0   ibuprofen  (ADVIL ) 800 MG tablet Take 1 tablet (800 mg total) by mouth every 8 (eight) hours as needed. 30 tablet 0   insulin  degludec (TRESIBA  FLEXTOUCH) 100 UNIT/ML FlexTouch Pen Inject 24 Units into the skin 2 (two) times daily. 9 mL 1   Insulin  Pen Needle (1ST TIER UNIFINE PENTIPS) 32G X 4 MM MISC Use as directed with Tresiba  Flextouch pen 100 each 12   Lancets Misc. MISC Use to check blood sugar twice daily. May substitute to any manufacturer covered by patient's insurance. Patient does not know name of device. 100 each 0   metFORMIN  (GLUCOPHAGE ) 500 MG tablet Take 2 tablets (1,000 mg total) by mouth daily with breakfast AND 2 tablets (1,000 mg total) every evening. 270 tablet 3   Multiple Vitamin  (MULTIVITAMIN WITH MINERALS) TABS tablet Take 1 tablet by mouth daily in the afternoon.     nystatin  (MYCOSTATIN /NYSTOP ) powder Apply 1 Application topically 3 (three) times daily as needed (for moisture and vaginal yeast recurrence .). (Patient not taking: Reported on 11/26/2023) 30 g 5   nystatin -triamcinolone  ointment (MYCOLOG) Apply 1 Application topically 2 (two) times daily. (Patient not taking: Reported on 11/26/2023) 60 g 3   No current facility-administered medications for this visit.    Allergies as of 11/29/2023   (No Known Allergies)    Family History  Problem Relation Age of Onset   Diabetes Mother    Osteoarthritis Mother    Hypertension Mother    Hyperlipidemia Mother    Arthritis Mother    Colon cancer Mother    Colon polyps Neg Hx    Esophageal cancer Neg Hx    Stomach cancer Neg Hx    Ulcerative colitis Neg Hx  Breast cancer Neg Hx     Social History   Socioeconomic History   Marital status: Married    Spouse name: Not on file   Number of children: 3   Years of education: Not on file   Highest education level: 12th grade  Occupational History   Not on file  Tobacco Use   Smoking status: Never   Smokeless tobacco: Never  Vaping Use   Vaping status: Never Used  Substance and Sexual Activity   Alcohol use: No   Drug use: No   Sexual activity: Yes    Birth control/protection: Surgical    Comment: Hysterectomy  Other Topics Concern   Not on file  Social History Narrative   Pt was born in [place]   Caffeine  none.     Education: 6 th grade.    Work:  remote    Social Drivers of Corporate investment banker Strain: Medium Risk (02/04/2023)   Overall Financial Resource Strain (CARDIA)    Difficulty of Paying Living Expenses: Somewhat hard  Food Insecurity: No Food Insecurity (07/22/2023)   Hunger Vital Sign    Worried About Running Out of Food in the Last Year: Never true    Ran Out of Food in the Last Year: Never true  Transportation Needs: No  Transportation Needs (07/22/2023)   PRAPARE - Administrator, Civil Service (Medical): No    Lack of Transportation (Non-Medical): No  Physical Activity: Insufficiently Active (02/04/2023)   Exercise Vital Sign    Days of Exercise per Week: 3 days    Minutes of Exercise per Session: 30 min  Stress: Stress Concern Present (02/04/2023)   [employer] of Occupational Health - Occupational Stress Questionnaire    Feeling of Stress : Rather much  Social Connections: Moderately Integrated (02/04/2023)   Social Connection and Isolation Panel    Frequency of Communication with Friends and Family: More than three times a week    Frequency of Social Gatherings with Friends and Family: Once a week    Attends Religious Services: 1 to 4 times per year    Active Member of [employer] or Organizations: No    Attends Banker Meetings: Never    Marital Status: Married  Catering manager Violence: Not At Risk (02/04/2023)   Humiliation, Afraid, Rape, and Kick questionnaire    Fear of Current or Ex-Partner: No    Emotionally Abused: No    Physically Abused: No    Sexually Abused: No    Review of Systems:    Constitutional: No weight loss, fever, chills, weakness or fatigue HEENT: Eyes: No change in vision               Ears, Nose, Throat:  No change in hearing or congestion Skin: No rash or itching Cardiovascular: No chest pain, chest pressure or palpitations   Respiratory: No SOB or cough Gastrointestinal: See HPI and otherwise negative Genitourinary: No dysuria or change in urinary frequency Neurological: No headache, dizziness or syncope Musculoskeletal: No new muscle or joint pain Hematologic: No bleeding or bruising Psychiatric: No history of depression or anxiety    Physical Exam:  Vital signs: LMP 09/20/2019 (Exact Date)   Constitutional: NAD, alert and cooperative Head:  Normocephalic and atraumatic. Eyes:   PEERL, EOMI. No icterus. Conjunctiva  pink. Respiratory: Respirations even and unlabored. Lungs clear to auscultation bilaterally.   No wheezes, crackles, or rhonchi.  Cardiovascular:  Regular rate and rhythm. No peripheral edema, cyanosis or pallor.  Gastrointestinal:  Soft, nondistended, nontender. No rebound or guarding. Normal bowel sounds. No appreciable masses or hepatomegaly. Rectal:  Declines Msk:  Symmetrical without gross deformities. Without edema, no deformity or joint abnormality.  Neurologic:  Alert and  oriented x4;  grossly normal neurologically.  Skin:   Dry and intact without significant lesions or rashes. Psychiatric: Oriented to person, place and time. Demonstrates good judgement and reason without abnormal affect or behaviors.  Physical Exam    RELEVANT LABS AND IMAGING: CBC    Component Value Date/Time   WBC 9.7 03/29/2023 1122   WBC 9.5 03/29/2023 1122   RBC 5.33 (H) 03/29/2023 1122   RBC 5.34 (H) 03/29/2023 1122   HGB 14.2 03/29/2023 1122   HGB 14.2 03/29/2023 1122   HGB 13.3 12/16/2021 1150   HCT 44.2 03/29/2023 1122   HCT 44.4 03/29/2023 1122   HCT 41.4 12/16/2021 1150   PLT 214 03/29/2023 1122   PLT 221 03/29/2023 1122   PLT 279 12/16/2021 1150   MCV 82.9 03/29/2023 1122   MCV 83.1 03/29/2023 1122   MCV 82 12/16/2021 1150   MCH 26.6 03/29/2023 1122   MCH 26.6 03/29/2023 1122   MCHC 32.1 03/29/2023 1122   MCHC 32.0 03/29/2023 1122   RDW 13.9 03/29/2023 1122   RDW 13.9 03/29/2023 1122   RDW 14.7 12/16/2021 1150   LYMPHSABS 2.8 03/29/2023 1122   LYMPHSABS 3.1 12/16/2021 1150   MONOABS 0.6 03/29/2023 1122   EOSABS 0.3 03/29/2023 1122   EOSABS 0.3 12/16/2021 1150   BASOSABS 0.1 03/29/2023 1122   BASOSABS 0.0 12/16/2021 1150    CMP     Component Value Date/Time   NA 136 11/10/2023 1106   K 4.2 11/10/2023 1106   CL 100 11/10/2023 1106   CO2 18 (L) 11/10/2023 1106   GLUCOSE 339 (H) 11/10/2023 1106   GLUCOSE 326 (H) 03/29/2023 1122   BUN 8 11/10/2023 1106   CREATININE 0.49 (L)  11/10/2023 1106   CREATININE 0.52 01/03/2013 1603   CALCIUM  9.2 11/10/2023 1106   PROT 7.2 11/10/2023 1106   ALBUMIN 4.5 11/10/2023 1106   AST 51 (H) 11/10/2023 1106   ALT 44 (H) 11/10/2023 1106   ALKPHOS 175 (H) 11/10/2023 1106   BILITOT 0.8 11/10/2023 1106   GFRNONAA >60 03/29/2023 1122   GFRNONAA >89 01/03/2013 1603   GFRAA >60 10/18/2019 0149   GFRAA >89 01/03/2013 1603     Assessment/Plan:   Assessment and Plan Assessment & Plan      Anal fissure Rectal pain Rectal bleeding Symptoms consistent with anal fissure versus internal hemorrhoid.  Recent colonoscopy 05/2023 with internal hemorrhoids. Given diltiazem /lidocaine  compound cream at last appt. - Prescribe lidocaine  5%/diltiazem  2% compound cream apply three times daily for 6-8 weeks. - Instructed to call if symptoms persist after a few weeks. - Consider suppositories if cream ineffective versus internal hemorrhoid banding - follow up with me 6-8 weeks   History of colon polyps Last colonoscopy 05/2023 - Due 2031  Nestor Blower, PA-C [facility] 11/29/2023, 12:23 PM  Cc: Knute Thersia Bitters, *

## 2023-11-30 ENCOUNTER — Other Ambulatory Visit (HOSPITAL_BASED_OUTPATIENT_CLINIC_OR_DEPARTMENT_OTHER): Payer: Self-pay

## 2023-11-30 ENCOUNTER — Telehealth: Payer: Self-pay

## 2023-11-30 NOTE — Progress Notes (Signed)
   11/30/2023  Patient ID: Alexandra Norris, female   DOB: [birth year], 48 y.o.   MRN: [id number]  Brief summary of outreach:   The patient was contacted twice via a Spanish interpreter. The first attempt was unsuccessful, and a voicemail could not be left due to a full inbox. On the second attempt, HIPAA identifiers were confirmed, and an explanation was initiated regarding the use of Ozempic following cholecystectomy; however, the call was disconnected. Communication was notably difficult due to significant background noise. A follow-up call will be attempted at a later time.  Per discussion with pharmacy technician Burnard Lot, CPhT, the patient is approved for [facility]) coverage through March 2026. During the most recent phone conversation, the patient shared that her PCP assisted her in applying for a medication assistance program earlier this year, through which she pays $30 for three Ozempic pens. A call to [employer] confirmed the patient is approved for medication assistance until 02/17/2024, with the option to renew 30 days prior to expiration. Her last Tresiba  shipment from [employer] was in December 2024; a new refill has been requested by this pharmacist and will be shipped to the PCP's office within 10-14 business days. Contacted [facility] and it was mentioned that the patient has been filling Tresiba  at $35 per month/fill using a manufacturer coupon, which may have led to confusion about her assistance coverage. The most recent fill was on 11/17/2023 for a 23-day supply at $35, and the prior three fills were 30-day supplies also at $35. No insurance is currently on file. She currently has Tresiba  filled for 19 days supply at no charge. Will send a patient message informing her Tresiba  will be shipped to the office in 10-14 business days.   The referral noted, "please assist patient with possible cost coverage of GLP-1 for uncontrolled Type 2 DM." The  patient has a history of cholecystectomy, and while there is limited safety data regarding GLP-1 use in this population, she was previously on Trulicity  without reported complications, though it was discontinued due to cost. There is the potential risk of gallstones while on a GLP-1 agonist, but generally noted to be safe. The patient was contacted to review this information, but the call was again disconnected. Given her upcoming appointment with Ms. Evern on 12/08/2023, this pharmacist will collaborate with the PCP to discuss Ozempic, especially since she is already approved for Tresiba  via [employer]. There are no current contraindications for GLP-1 therapy, though it is recommended that the patient be closely monitored for tolerance and safety if Ozempic is initiated.  If Ozempic is prescribed, the provider will need to fax a Reorder form to [employer] with the requested dose. Additionally, pages 7-9 of the form will need to be updated with the correct PCP information, as the patient recently changed providers.  Dorcas Solian, PharmD Clinical Pharmacist Cell: [phone]

## 2023-12-08 ENCOUNTER — Ambulatory Visit (HOSPITAL_BASED_OUTPATIENT_CLINIC_OR_DEPARTMENT_OTHER): Payer: Self-pay | Admitting: Family Medicine

## 2023-12-09 ENCOUNTER — Other Ambulatory Visit (HOSPITAL_BASED_OUTPATIENT_CLINIC_OR_DEPARTMENT_OTHER): Payer: Self-pay | Admitting: Family Medicine

## 2023-12-09 MED ORDER — OZEMPIC (0.25 OR 0.5 MG/DOSE) 2 MG/3ML ~~LOC~~ SOPN: PEN_INJECTOR | SUBCUTANEOUS | Status: AC

## 2023-12-20 NOTE — Telephone Encounter (Signed)
-----   Message from Thersia Bitters Caudle sent at 12/15/2023 11:53 AM EDT ----- Regarding: Ozempic  Hi Malvern Kadlec,  I received notification from [employer] that her PAP application has not been completed. Are you aware if she has completed this since or not? Thanks!

## 2023-12-20 NOTE — Progress Notes (Signed)
   12/20/2023  Patient ID: Sherline Evern Sierra, female   DOB: [birth year], 48 y.o.   MRN: [id number]  I contacted the patient today regarding her medication assistance application, following the PCP's note indicating that the patient had not completed her portion of the application. Ms. Rodriguez Zamarripa stated that she does not recall completing an application for medication assistance.  I then contacted [employer] to clarify the situation, with the patient on the phone. According to the representative, pages 1-5 of the enrollment application must be completed by the patient. While the reorder form had been completed, the representative confirmed that the patient's application for Tresiba  was never submitted or approved.  I reviewed my previous notes with the representative, but she stated that the information I referenced could not be located in their system. I requested that she search under the patient's last name, Evern, and she initially claimed to have done so. She also attempted to search using the patient's phone number, but it was not found in their records. Upon further request after informing of a prior application, the representative located the patient under the last name "Rodgriguez" (note the different spelling), and confirmed that a different healthcare provider was listed on the file.   Of note, starting on on 12/31/2023 auto refill for Ozempic , NovoFine pen needles, and Novo Echo pen needles will end. Thirty (30) days after [facility] receives the first order, the healthcare provider may submit a refill request.   Action items:  The new healthcare provider will need to complete a new application and reorder/refill form using the patient's last name as Rodgriguez and include [employer]'s patient ID on the applicatoin. The patient ID number is: [id number].  No further action is required from the patient at this time with regard to the application.  [employer]  will cancel the profile listed under "Zamarripa" profile.   Spoke with Ciji at [employer]   Time spent: 47 minutes  Dorcas Solian, PharmD Clinical Pharmacist Cell: [phone]

## 2023-12-22 NOTE — Progress Notes (Signed)
   12/22/2023  Patient ID: Alexandra Norris, female   DOB: May 15, 1976, 48 y.o.   MRN: [id number]  Brief summary of outreach:   I called [employer] today regarding Tresiba  order, medication is still in process and should be shipped later this week or early next week.   Dorcas Solian, PharmD Clinical Pharmacist Cell: [phone]

## 2023-12-24 ENCOUNTER — Other Ambulatory Visit: Payer: Self-pay

## 2023-12-24 ENCOUNTER — Telehealth: Payer: Self-pay

## 2023-12-24 NOTE — Progress Notes (Signed)
   12/24/2023  Patient ID: Sherline Evern Sierra, female   DOB: [birth year], 48 y.o.   MRN: [id number]  [facility] Application Update I contacted [employer] and was informed that, although the updated application still lists the patient's last name as Zamarripa, it has been successfully linked to her existing profile under the last name Evern.  However, the processing of her application was delayed due to the presence of two different providers listed across the applications--despite this having been requested and discussed during previous telephone calls with [employer].  For clarification: The Tresiba  application lists [facility] as the provider. The Ozempic  application lists [facility] as the provider.   New order released for Ozempic  and will be shipped in 10 -14 business days. However, since the patient was already approved for Tresiba , the entire application expires on 02/17/2024 and patient will need to re-enrolled - this includes Ozempic  order.   Additionally, patients current PCP Thersia Stark, FNP has been updated to profile. Will need to use Patient ID number when calling [employer] as it is difficult to find patient with typical HIPAA identifiers. Will need to re-enroll patient next month by 01/17/2024. Will send to medication assistance team during next outreach.   Patient was called for updates and follow up on diabetes management but was unable to reach today.   Will try to follow up with patient next week.   Dorcas Solian, PharmD Clinical Pharmacist Cell: [phone]

## 2023-12-27 ENCOUNTER — Ambulatory Visit: Payer: Self-pay | Admitting: Orthopedic Surgery

## 2023-12-31 NOTE — Telephone Encounter (Signed)
 Pt's tresiba  was received today 12/31/23. Asked out front staff if they could try to reach out to either pt or spouse to let them know that this has arrived to see when they might be able to come by the office to pick it up. Will also send a mychart message.

## 2024-01-06 ENCOUNTER — Ambulatory Visit (HOSPITAL_COMMUNITY)
Admission: RE | Admit: 2024-01-06 | Discharge: 2024-01-06 | Disposition: A | Discharge status: Home / Self Care | Payer: Self-pay | Source: Ambulatory Visit | Attending: Internal Medicine | Admitting: Internal Medicine

## 2024-01-06 ENCOUNTER — Ambulatory Visit (INDEPENDENT_AMBULATORY_CARE_PROVIDER_SITE_OTHER): Payer: Self-pay

## 2024-01-06 ENCOUNTER — Ambulatory Visit: Payer: Self-pay

## 2024-01-06 ENCOUNTER — Encounter (HOSPITAL_COMMUNITY): Payer: Self-pay

## 2024-01-06 VITALS — BP 138/84 | HR 95 | Temp 97.8°F | Resp 18

## 2024-01-06 DIAGNOSIS — J029 Acute pharyngitis, unspecified: Secondary | ICD-10-CM

## 2024-01-06 DIAGNOSIS — U071 COVID-19: Secondary | ICD-10-CM

## 2024-01-06 DIAGNOSIS — R051 Acute cough: Secondary | ICD-10-CM

## 2024-01-06 DIAGNOSIS — J069 Acute upper respiratory infection, unspecified: Secondary | ICD-10-CM

## 2024-01-06 LAB — POCT RAPID STREP A (OFFICE): Rapid Strep A Screen: NEGATIVE

## 2024-01-06 LAB — POC COVID19/FLU A&B COMBO
Covid Antigen, POC: POSITIVE — AB
Influenza A Antigen, POC: NEGATIVE
Influenza B Antigen, POC: NEGATIVE

## 2024-01-06 MED ORDER — FLUTICASONE PROPIONATE 50 MCG/ACT NA SUSP: 2.0000 | Freq: Every day | NASAL | 2 refills | Status: DC

## 2024-01-06 MED ORDER — PROMETHAZINE-DM 6.25-15 MG/5ML PO SYRP: 5.0000 mL | ORAL_SOLUTION | Freq: Three times a day (TID) | ORAL | 0 refills | Status: DC | PRN

## 2024-01-06 MED ORDER — PAXLOVID (300/100) 20 X 150 MG & 10 X 100MG PO TBPK: 3.0000 | ORAL_TABLET | Freq: Two times a day (BID) | ORAL | 0 refills | Status: DC

## 2024-01-06 NOTE — ED Triage Notes (Signed)
 Pts husband states she has had a cough, congestion, headache and body aches X 3 days. She has been taking nyquil and tylenol  as needed.

## 2024-01-06 NOTE — Discharge Instructions (Signed)
 Chest x-ray done today was negative for an acute process.  Strep testing and flu testing was negative however testing was positive for COVID.  Symptoms and physical exam findings are consistent with this as well.  His symptoms have been going on for less than 72 hours therefore we will treat with the following: Paxlovid  Follow package instructions Promethazine  DM 5 mL every 8 hours as needed for cough.  Use caution as this medication can cause drowsiness. Flonase  2 sprays each nostril once daily for 7 days for nasal congestion.  May use as needed after this. Rest and stay hydrated.  May alternate Tylenol  and ibuprofen  for fevers, headaches, body aches Return to urgent care or PCP if symptoms worsen or fail to resolve.

## 2024-01-06 NOTE — Telephone Encounter (Signed)
 FYI Only or Action Required?: FYI only for provider.  Patient was last seen in primary care on 11/10/2023 by Knute Thersia Bitters, FNP.  Called Nurse Triage reporting No chief complaint on file..  Symptoms began several days ago.  Interventions attempted: OTC medications: Nyquil, Tylenol .  Symptoms are: gradually worsening.  Triage Disposition: Call PCP Within 24 Hours  Patient/caregiver understands and will follow disposition?: Yes   PIL: Translator, Jesus: ID: [id number]  Reason for Disposition  Patient is HIGH RISK (e.g., 65 years and older, pregnant, HIV+, or chronic medical condition)  Answer Assessment - Initial Assessment Questions 1. SYMPTOMS: What is your main symptom or concern? (e.g., cough, fever, shortness of breath, muscle aches)      Cough, Headaches  2. ONSET: When did the symptoms start?      3 Days ago  3. COUGH: Do you have a cough? If Yes, ask: How bad is the cough?       Yes, non productive, intermittent  4. FEVER: Do you have a fever? If Yes, ask: What is your temperature, how was it measured, and when did it start?     No  5. BREATHING DIFFICULTY: Are you having any difficulty breathing? (e.g., normal; shortness of breath, wheezing, unable to speak)      Denies  6. BETTER-SAME-WORSE: Are you getting better, staying the same or getting worse compared to yesterday?  If getting worse, ask, In what way?     Yes, feels worse  7. OTHER SYMPTOMS: Do you have any other symptoms?  (e.g., chills, fatigue, headache, loss of smell or taste, muscle pain, sore throat)     Eyes burning, Tender Throat, Chills, Body Aches, Headaches, Fatigue  8. INFLUENZA EXPOSURE: Was there any known exposure to influenza (flu) before the symptoms began?      Unsure, does not believe so.  9. INFLUENZA SUSPECTED: Why do you think you have influenza? (e.g., positive flu self-test at home, symptoms after exposure).     Symptoms   10. INFLUENZA VACCINE: Have  you had the flu vaccine? If Yes, ask: When did you last get it?       Denies  11. HIGH RISK FOR COMPLICATIONS: Do you have any chronic medical problems? (e.g., asthma, heart or lung disease, obesity, weak immune system)       Diabetes, Asthma  12. PREGNANCY: Is there any chance you are pregnant? When was your last menstrual period?       No and No  13. O2 SATURATION MONITOR:  Do you use an oxygen saturation monitor (pulse oximeter) at home? If Yes, ask What is your reading (oxygen level) today? What is your usual oxygen saturation reading? (e.g., 95%)       Unsure  Protocols used: Influenza (Flu) Suspected-A-AH

## 2024-01-06 NOTE — ED Provider Notes (Signed)
 [facility]    CSN: [id number] Arrival date & time: 01/06/24  1531      History   Chief Complaint Chief Complaint  Patient presents with   Cough    Entered by patient   Nasal Congestion   Headache   Generalized Body Aches    HPI Alexandra Norris is a 48 y.o. female.   48 year old female who presents urgent care with complaints of cough, congestion, sore throat, headaches, body aches, mild shortness of breath and chills.  Her symptoms started about 3 days ago but have gotten worse.  She has a significant manage drainage down the back of her throat causing a sore throat.  She reports the cough has gotten worse and is worse at night.  She does feel little short of breath at times.  The headache is all over her head and not necessarily one location.  She has aching all over as well.  She denies any fevers, abdominal pain, nausea, vomiting, ear pain.  She has been using Tylenol  and NyQuil.   Cough Associated symptoms: chills, headaches, shortness of breath and sore throat   Associated symptoms: no chest pain, no ear pain, no fever and no rash   Headache Associated symptoms: congestion, cough and sore throat   Associated symptoms: no abdominal pain, no back pain, no ear pain, no eye pain, no fever, no seizures and no vomiting     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 level with radiculopathy 04/03/2020   Cervical post-laminectomy syndrome 03/06/2022   Cubital tunnel syndrome on right 12/02/2021   Depression    DEPRESSION, SITUATIONAL, PROLONGED 02/05/2009   Qualifier: Diagnosis of  By: Reynaldo  MD, Khary     Diabetes mellitus without complication (HCC)    Dizziness 04/22/2021   Dyspnea    Elevated blood-pressure reading, without diagnosis of hypertension 06/17/2020   Endometriosis    Fatigue  04/22/2021   History of blood transfusion 1999   w/vaginal delivery   History of bronchitis    I've stayed in hospital 2 X w/bronchitis   Hyperlipidemia    Hypertension    Impingement syndrome of left shoulder    Influenza vaccine needed 04/03/2020   Left carpal tunnel syndrome 07/11/2019   Migraine    Moderate persistent asthma with acute exacerbation 12/06/2006   Qualifier: Diagnosis of  By: Reynaldo  MD, Khary     Nasal obstruction 02/18/2016   Formatting of this note might be different from the original.  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   Pre-diabetes 10/03/2019   A1C 6.4   Right carpal tunnel syndrome 07/11/2019   S/P arthroscopy of right shoulder 07/08/2021   S/P carpal tunnel release 08/30/2019   Seasonal allergies 10/08/2016   Shingles 11/14/2020   Sinusitis, bacterial 10/07/2021   Snoring 01/20/2022    Patient Active Problem List   Diagnosis Date Noted   Family history of colon cancer 05/25/2023   Benign neoplasm of colon 05/25/2023   Acute intractable headache 03/31/2023   Type 2 diabetes mellitus with hyperglycemia, with long-term current use of insulin  (HCC) 07/28/2022   Asthma due to seasonal  allergies 09/25/2021   Arthritis of right acromioclavicular joint 04/24/2021   Chronic left shoulder pain 04/22/2021   Hypertension associated with diabetes (HCC) 04/22/2021   Hyperlipidemia associated with type 2 diabetes mellitus (HCC) 04/22/2021   Fatty liver disease, nonalcoholic 09/09/2020   Vitamin D  deficiency 09/25/2015   Anxiety and depression 07/06/2014   GASTROESOPHAGEAL REFLUX DISEASE 08/14/2009    Past Surgical History:  Procedure Laterality Date   ABDOMINAL HYSTERECTOMY  10/17/2019   APPENDECTOMY     CARPAL TUNNEL RELEASE Right 07/19/2019   Procedure: RIGHT CARPAL TUNNEL RELEASE;  Surgeon: Jerri Kay HERO, MD;   Location: [facility];  Service: Orthopedics;  Laterality: Right;   CARPAL TUNNEL RELEASE Left 09/13/2019   Procedure: LEFT CARPAL TUNNEL RELEASE;  Surgeon: Jerri Kay HERO, MD;  Location: [facility];  Service: Orthopedics;  Laterality: Left;  Bier block   CESAREAN SECTION  1997; 2008   CHOLECYSTECTOMY  2011   COLONOSCOPY WITH PROPOFOL  N/A 05/25/2023   Procedure: COLONOSCOPY WITH PROPOFOL ;  Surgeon: Leigh Elspeth SQUIBB, MD;  Location: [facility];  Service: Gastroenterology;  Laterality: N/A;   POLYPECTOMY  05/25/2023   Procedure: POLYPECTOMY;  Surgeon: Leigh Elspeth SQUIBB, MD;  Location: [facility];  Service: Gastroenterology;;   SHOULDER ARTHROSCOPY WITH DISTAL CLAVICLE RESECTION  02/12/2021   Procedure: SHOULDER ARTHROSCOPY WITH DISTAL CLAVICLE RESECTION;  Surgeon: Jerri Kay HERO, MD;  Location: [facility];  Service: Orthopedics;;   SHOULDER ARTHROSCOPY WITH ROTATOR CUFF REPAIR AND SUBACROMIAL DECOMPRESSION Left 02/12/2021   Procedure: LEFT SHOULDER ARTHROSCOPY WITH ROTATOR CUFF REPAIR, SUBACROMIAL DECOMPRESSION, EXTENSIVE DEBRIDEMENT;  Surgeon: Jerri Kay HERO, MD;  Location: [facility];  Service: Orthopedics;  Laterality: Left;   TONSILLECTOMY     tonsilletomy     TUBAL LIGATION  04/2007   ULNAR TUNNEL RELEASE Right 12/17/2021   Procedure: RIGHT CUBITAL TUNNEL RELEASE;  Surgeon: Jerri Kay HERO, MD;  Location: [facility];  Service: Orthopedics;  Laterality: Right;   VAGINAL HYSTERECTOMY Bilateral 10/17/2019   Procedure: HYSTERECTOMY VAGINAL WITH SALPINGECTOMY;  Surgeon: Lorence Ozell CROME, MD;  Location[facility];  Service: Gynecology;  Laterality: Bilateral;    OB History     Gravida  3   Para  3   Term  3   Preterm      AB      Living  3      SAB      IAB      Ectopic      Multiple      Live Births               Home Medications    Prior to Admission medications   Medication Sig Start Date End  Date Taking? Authorizing Provider  fluticasone  (FLONASE ) 50 MCG/ACT nasal spray Place 2 sprays into both nostrils daily. 01/06/24  Yes Rhiley Tarver A, PA-C  glipiZIDE  (GLUCOTROL  XL) 5 MG 24 hr tablet Take 1 tablet (5 mg total) by mouth daily with breakfast. 11/11/23  Yes Caudle, Thersia Bitters, FNP  insulin  degludec (TRESIBA  FLEXTOUCH) 100 UNIT/ML FlexTouch Pen Inject 24 Units into the skin 2 (two) times daily. 11/29/23  Yes Caudle, Thersia Bitters, FNP  metFORMIN  (GLUCOPHAGE ) 500 MG tablet Take 2 tablets (1,000 mg total) by mouth daily with breakfast AND 2 tablets (1,000 mg total) every evening. 02/16/23  Yes Towana Small, FNP  nirmatrelvir/ritonavir (PAXLOVID , 300/100,) 20 x 150 MG & 10 x 100MG  TBPK Take 3 tablets by mouth 2 (two) times  daily for 5 days. Patient GFR is 117. Take nirmatrelvir (150 mg) two tablets twice daily for 5 days and ritonavir (100 mg) one tablet twice daily for 5 days. 01/06/24 01/11/24 Yes Haru Anspaugh A, PA-C  promethazine -dextromethorphan (PROMETHAZINE -DM) 6.25-15 MG/5ML syrup Take 5 mLs by mouth every 8 (eight) hours as needed for cough. 01/06/24  Yes Davarion Cuffee A, PA-C  albuterol  (VENTOLIN  HFA) 108 (90 Base) MCG/ACT inhaler Inhale 2 puffs into the lungs every 6 (six) hours as needed for wheezing or shortness of breath. 10/05/22   de [place], Quintin PARAS, MD  butalbital -acetaminophen -caffeine  (FIORICET ) 50-325-40 MG tablet Take 1 tablet by mouth every 6 (six) hours as needed for headache. 03/31/23   Towana Small, FNP  Continuous Glucose Sensor (FREESTYLE LIBRE 14 DAY SENSOR) MISC Apply upper deltoid every 14 days, use reader to determine blood sugars. 08/09/23   CaudleThersia Bitters, FNP  diltiazem  2 % GEL With 5% lidocaine . Apply to rectum 3 times daily for 6-8 weeks. Patient not taking: Reported on 11/26/2023 08/27/23   Mollie Nestor HERO, PA-C  FLUoxetine  (PROZAC ) 10 MG capsule Take 1 capsule (10 mg total) by mouth daily. 08/09/23   Caudle, Thersia Bitters, FNP  Glucose  Blood (BLOOD GLUCOSE TEST STRIPS) STRP Use to check blood sugar twice daily. May substitute to any manufacturer covered by patient's insurance. Patient doesn't know name of glucometer 11/29/23   Caudle, Thersia Bitters, FNP  ibuprofen  (ADVIL ) 800 MG tablet Take 1 tablet (800 mg total) by mouth every 8 (eight) hours as needed. 01/16/23     Insulin  Pen Needle (1ST TIER UNIFINE PENTIPS) 32G X 4 MM MISC Use as directed with Tres[employer]  Flextouch pen 05/26/23   Towana Small, FNP  Lancets Misc. MISC Use to check blood sugar twice daily. May substitute to any manufacturer covered by patient's insurance. Patient does not know name of device. 11/29/23   Caudle, Thersia Bitters, FNP  Multiple Vitamin (MULTIVITAMIN WITH MINERALS) TABS tablet Take 1 tablet by mouth daily in the afternoon.    [provider]  nystatin  (MYCOSTATIN /NYSTOP ) powder Apply 1 Application topically 3 (three) times daily as needed (for moisture and vaginal yeast recurrence .). Patient not taking: Reported on 11/26/2023 11/10/23   Caudle, Alexis Olivia, FNP  nystatin -triamcinolone  ointment (MYCOLOG) Apply 1 Application topically 2 (two) times daily. Patient not taking: Reported on 11/26/2023 11/10/23   Caudle, Alexis Olivia, FNP  Semaglutide ,0.25 or 0.5MG /DOS, (OZEMPIC , 0.25 OR 0.5 MG/DOSE,) 2 MG/3ML SOPN Inject 0.25 mg into the skin once a week for 30 days, THEN 0.5 mg once a week. 12/09/23 02/07/24  Caudle, Thersia Bitters, FNP  atorvastatin  (LIPITOR) 20 MG tablet Take 1 tablet (20 mg total) by mouth at bedtime. 03/04/23 10/13/23  Towana Small, FNP    Family History Family History  Problem Relation Age of Onset   Diabetes Mother    Osteoarthritis Mother    Hypertension Mother    Hyperlipidemia Mother    Arthritis Mother    Colon cancer Mother    Colon polyps Neg Hx    Esophageal cancer Neg Hx    Stomach cancer Neg Hx    Ulcerative colitis Neg Hx    Breast cancer Neg Hx     Social History Social History   Tobacco Use   Smoking  status: Never   Smokeless tobacco: Never  Vaping Use   Vaping status: Never Used  Substance Use Topics   Alcohol use: No   Drug use: No     Allergies   Patient has  no known allergies.   Review of Systems Review of Systems  Constitutional:  Positive for chills. Negative for fever.  HENT:  Positive for congestion and sore throat. Negative for ear pain.   Eyes:  Negative for pain and visual disturbance.  Respiratory:  Positive for cough and shortness of breath.   Cardiovascular:  Negative for chest pain and palpitations.  Gastrointestinal:  Negative for abdominal pain and vomiting.  Genitourinary:  Negative for dysuria and hematuria.  Musculoskeletal:  Negative for arthralgias and back pain.  Skin:  Negative for color change and rash.  Neurological:  Positive for headaches. Negative for seizures and syncope.  All other systems reviewed and are negative.    Physical Exam Triage Vital Signs ED Triage Vitals [01/06/24 1609]  Encounter Vitals Group     BP 138/84     Girls Systolic BP Percentile      Girls Diastolic BP Percentile      Boys Systolic BP Percentile      Boys Diastolic BP Percentile      Pulse Rate 95     Resp 18     Temp 97.8 F (36.6 C)     Temp Source Oral     SpO2 95 %     Weight      Height      Head Circumference      Peak Flow      Pain Score      Pain Loc      Pain Education      Exclude from Growth Chart    No data found.  Updated Vital Signs BP 138/84 (BP Location: Left Arm)   Pulse 95   Temp 97.8 F (36.6 C) (Oral)   Resp 18   LMP 09/20/2019 (Exact Date)   SpO2 95%   Visual Acuity Right Eye Distance:   Left Eye Distance:   Bilateral Distance:    Right Eye Near:   Left Eye Near:    Bilateral Near:     Physical Exam Vitals and nursing note reviewed.  Constitutional:      General: She is not in acute distress.    Appearance: She is well-developed.  HENT:     Head: Normocephalic and atraumatic.     Nose: Nasal tenderness,  mucosal edema, congestion and rhinorrhea present. Rhinorrhea is purulent.     Right Sinus: Maxillary sinus tenderness present.     Left Sinus: Maxillary sinus tenderness present.     Mouth/Throat:     Pharynx: Oropharyngeal exudate and posterior oropharyngeal erythema present.  Eyes:     Extraocular Movements: Extraocular movements intact.     Conjunctiva/sclera: Conjunctivae normal.     Pupils: Pupils are equal, round, and reactive to light.  Cardiovascular:     Rate and Rhythm: Normal rate and regular rhythm.     Heart sounds: No murmur heard. Pulmonary:     Effort: Pulmonary effort is normal. No tachypnea or respiratory distress.     Breath sounds: Examination of the right-lower field reveals decreased breath sounds. Examination of the left-lower field reveals decreased breath sounds. Decreased breath sounds (mild in bases) present.  Abdominal:     Palpations: Abdomen is soft.     Tenderness: There is no abdominal tenderness.  Musculoskeletal:        General: No swelling.     Cervical back: Neck supple.  Skin:    General: Skin is warm and dry.     Capillary Refill: Capillary refill takes less than 2  seconds.  Neurological:     Mental Status: She is alert.     Cranial Nerves: No cranial nerve deficit.     Sensory: No sensory deficit.  Psychiatric:        Mood and Affect: Mood normal.        Speech: Speech normal.        Behavior: Behavior normal.      UC Treatments / Results  Labs (all labs ordered are listed, but only abnormal results are displayed) Labs Reviewed  POC COVID19/FLU A&B COMBO - Abnormal; Notable for the following components:      Result Value   Covid Antigen, POC Positive (*)    All other components within normal limits  POCT RAPID STREP A (OFFICE)    EKG   Radiology DG Chest 2 View Result Date: 01/06/2024 CLINICAL DATA:  Shortness of breath and cough. Congestion and body aches for 3 days EXAM: CHEST - 2 VIEW COMPARISON:  Chest x-ray 03/29/2023  FINDINGS: No consolidation, pneumothorax or effusion. No edema. Normal cardiopericardial silhouette. Mild linear opacity left lung base likely scar or atelectasis. Surgical clips in the right upper quadrant of the abdomen. Fixation hardware along the lower cervical spine at the edge of the imaging field. Mild degenerative change. IMPRESSION: Mild left basilar scar or atelectasis.  No consolidation. Electronically Signed   By: Ranell Bring M.D.   On: 01/06/2024 16:52    Procedures Procedures (including critical care time)  Medications Ordered in UC Medications - No data to display  Initial Impression / Assessment and Plan / UC Course  I have reviewed the triage vital signs and the nursing notes.  Pertinent labs & imaging results that were available during my care of the patient were reviewed by me and considered in my medical decision making (see chart for details).     Acute cough - Plan: POC Covid19/Flu A&B Antigen, DG Chest 2 View, POC Covid19/Flu A&B Antigen, DG Chest 2 View  Sore throat - Plan: POC rapid strep A, POC rapid strep A  COVID  Viral URI with cough   Chest x-ray done today was negative for an acute process.  Strep testing and flu testing was negative however testing was positive for COVID.  Symptoms and physical exam findings are consistent with this as well.  His symptoms have been going on for less than 72 hours therefore we will treat with the following: Paxlovid  Follow package instructions Promethazine  DM 5 mL every 8 hours as needed for cough.  Use caution as this medication can cause drowsiness. Flonase  2 sprays each nostril once daily for 7 days for nasal congestion.  May use as needed after this. Rest and stay hydrated.  May alternate Tylenol  and ibuprofen  for fevers, headaches, body aches Return to urgent care or PCP if symptoms worsen or fail to resolve.    Final Clinical Impressions(s) / UC Diagnoses   Final diagnoses:  Acute cough  Sore throat  COVID   Viral URI with cough     Discharge Instructions      Chest x-ray done today was negative for an acute process.  Strep testing and flu testing was negative however testing was positive for COVID.  Symptoms and physical exam findings are consistent with this as well.  His symptoms have been going on for less than 72 hours therefore we will treat with the following: Paxlovid  Follow package instructions Promethazine  DM 5 mL every 8 hours as needed for cough.  Use caution as this medication  can cause drowsiness. Flonase  2 sprays each nostril once daily for 7 days for nasal congestion.  May use as needed after this. Rest and stay hydrated.  May alternate Tylenol  and ibuprofen  for fevers, headaches, body aches Return to urgent care or PCP if symptoms worsen or fail to resolve.       ED Prescriptions     Medication Sig Dispense Auth. Provider   nirmatrelvir/ritonavir (PAXLOVID , 300/100,) 20 x 150 MG & 10 x 100MG  TBPK Take 3 tablets by mouth 2 (two) times daily for 5 days. Patient GFR is 117. Take nirmatrelvir (150 mg) two tablets twice daily for 5 days and ritonavir (100 mg) one tablet twice daily for 5 days. 30 tablet Robinn Overholt A, PA-C   promethazine -dextromethorphan (PROMETHAZINE -DM) 6.25-15 MG/5ML syrup Take 5 mLs by mouth every 8 (eight) hours as needed for cough. 180 mL Kiera Hussey A, PA-C   fluticasone  (FLONASE ) 50 MCG/ACT nasal spray Place 2 sprays into both nostrils daily. 9.9 mL Alexandra Norris, [place]      PDMP not reviewed this encounter.   Alexandra Almarie LABOR, PA-C 01/06/24 1733

## 2024-01-07 ENCOUNTER — Other Ambulatory Visit (HOSPITAL_BASED_OUTPATIENT_CLINIC_OR_DEPARTMENT_OTHER): Payer: Self-pay

## 2024-01-07 ENCOUNTER — Telehealth (HOSPITAL_COMMUNITY): Payer: Self-pay

## 2024-01-07 MED ORDER — PAXLOVID (300/100) 20 X 150 MG & 10 X 100MG PO TBPK: 3.0000 | ORAL_TABLET | Freq: Two times a day (BID) | ORAL | 0 refills | Status: AC

## 2024-01-12 ENCOUNTER — Telehealth: Payer: Self-pay

## 2024-01-12 NOTE — Telephone Encounter (Signed)
 Gave pt a call pt is coming up due for re-enrollment on Novo Nordisk Ozempicon 02/17/24 , left a HIPAA VM.

## 2024-01-14 ENCOUNTER — Emergency Department (HOSPITAL_BASED_OUTPATIENT_CLINIC_OR_DEPARTMENT_OTHER): Payer: Self-pay

## 2024-01-14 ENCOUNTER — Encounter (HOSPITAL_BASED_OUTPATIENT_CLINIC_OR_DEPARTMENT_OTHER): Payer: Self-pay

## 2024-01-14 ENCOUNTER — Other Ambulatory Visit: Payer: Self-pay

## 2024-01-14 ENCOUNTER — Emergency Department (HOSPITAL_BASED_OUTPATIENT_CLINIC_OR_DEPARTMENT_OTHER)
Admission: EM | Admit: 2024-01-14 | Discharge: 2024-01-15 | Disposition: A | Discharge status: Home / Self Care | Payer: Self-pay | Attending: Emergency Medicine | Admitting: Emergency Medicine

## 2024-01-14 DIAGNOSIS — R519 Headache, unspecified: Secondary | ICD-10-CM

## 2024-01-14 DIAGNOSIS — U071 COVID-19: Secondary | ICD-10-CM | POA: Insufficient documentation

## 2024-01-14 LAB — COMPREHENSIVE METABOLIC PANEL WITH GFR
ALT: 201 U/L — ABNORMAL HIGH (ref 0–44)
AST: 165 U/L — ABNORMAL HIGH (ref 15–41)
Albumin: 4.4 g/dL (ref 3.5–5.0)
Alkaline Phosphatase: 181 U/L — ABNORMAL HIGH (ref 38–126)
Anion gap: 14 (ref 5–15)
BUN: 11 mg/dL (ref 6–20)
CO2: 21 mmol/L — ABNORMAL LOW (ref 22–32)
Calcium: 10 mg/dL (ref 8.9–10.3)
Chloride: 99 mmol/L (ref 98–111)
Creatinine, Ser: 0.49 mg/dL (ref 0.44–1.00)
GFR, Estimated: >60 mL/min (ref >60)
Glucose, Bld: 290 mg/dL — ABNORMAL HIGH (ref 70–99)
Potassium: 4.3 mmol/L (ref 3.5–5.1)
Sodium: 134 mmol/L — ABNORMAL LOW (ref 135–145)
Total Bilirubin: 0.4 mg/dL (ref 0.0–1.2)
Total Protein: 7.8 g/dL (ref 6.5–8.1)

## 2024-01-14 LAB — CBC WITH DIFFERENTIAL/PLATELET
Abs Immature Granulocytes: 0.04 K/uL (ref 0.00–0.07)
Basophils Absolute: 0 K/uL (ref 0.0–0.1)
Basophils Relative: 0 %
Eosinophils Absolute: 0.3 K/uL (ref 0.0–0.5)
Eosinophils Relative: 3 %
HCT: 41.2 % (ref 36.0–46.0)
Hemoglobin: 13.7 g/dL (ref 12.0–15.0)
Immature Granulocytes: 0 %
Lymphocytes Relative: 27 %
Lymphs Abs: 3.3 K/uL (ref 0.7–4.0)
MCH: 27.8 pg (ref 26.0–34.0)
MCHC: 33.3 g/dL (ref 30.0–36.0)
MCV: 83.6 fL (ref 80.0–100.0)
Monocytes Absolute: 0.7 K/uL (ref 0.1–1.0)
Monocytes Relative: 6 %
Neutro Abs: 7.6 K/uL (ref 1.7–7.7)
Neutrophils Relative %: 64 %
Platelets: 245 K/uL (ref 150–400)
RBC: 4.93 MIL/uL (ref 3.87–5.11)
RDW: 13.6 % (ref 11.5–15.5)
WBC: 12 K/uL — ABNORMAL HIGH (ref 4.0–10.5)
nRBC: 0 % (ref 0.0–0.2)

## 2024-01-14 LAB — MAGNESIUM: Magnesium: 1.6 mg/dL — ABNORMAL LOW (ref 1.7–2.4)

## 2024-01-14 LAB — LIPASE, BLOOD: Lipase: 25 U/L (ref 11–51)

## 2024-01-14 MED ORDER — PROCHLORPERAZINE EDISYLATE 10 MG/2ML IJ SOLN
10.0000 mg | Freq: Once | INTRAMUSCULAR | Status: AC
  Administered 2024-01-14: 10 mg via INTRAVENOUS
  Filled 2024-01-14: qty 2

## 2024-01-14 MED ORDER — SODIUM CHLORIDE 0.9 % IV BOLUS
1000.0000 mL | Freq: Once | INTRAVENOUS | Status: AC
  Administered 2024-01-14: 1000 mL via INTRAVENOUS

## 2024-01-14 MED ORDER — KETOROLAC TROMETHAMINE 15 MG/ML IJ SOLN
15.0000 mg | Freq: Once | INTRAMUSCULAR | Status: AC
  Administered 2024-01-14: 15 mg via INTRAVENOUS
  Filled 2024-01-14: qty 1

## 2024-01-14 MED ORDER — DIPHENHYDRAMINE HCL 50 MG/ML IJ SOLN
12.5000 mg | Freq: Once | INTRAMUSCULAR | Status: AC
  Administered 2024-01-14: 12.5 mg via INTRAVENOUS
  Filled 2024-01-14: qty 1

## 2024-01-14 MED ORDER — DEXAMETHASONE SODIUM PHOSPHATE 10 MG/ML IJ SOLN
10.0000 mg | Freq: Once | INTRAMUSCULAR | Status: AC
  Administered 2024-01-14: 10 mg via INTRAVENOUS
  Filled 2024-01-14: qty 1

## 2024-01-14 NOTE — ED Triage Notes (Signed)
 Pt was diagnosed with Covid on 01/06/24 and continues to have symptoms including; cough, congestion, body aches, and headache but is here tonight because she has had migraine x 2 days. Pt has history of migraines and this feels similar. Pt has only taken Ibuprofen  with no relief

## 2024-01-14 NOTE — ED Notes (Signed)
 Patient transported to CT

## 2024-01-14 NOTE — Discharge Instructions (Signed)
  Fue un placer atenderte [employer].  Tu estudio fue tranquilizador.  Es probable que sus sntomas se deban a su COVID  Asegrate de descansar e hidratarte en casa.  Haga un seguimiento ambulatorio con su proveedor de Marine scientist.  Regrese si los sntomas son nuevos o Psychologist, prison and probation services

## 2024-01-14 NOTE — ED Provider Notes (Signed)
 [place] Provider Note   CSN: [id number] Arrival date & time: 01/14/24  2145     Patient presents with: Migraine   Alexandra Norris is a 48 y.o. female here for evaluation of feeling unwell.  Diagnosed with COVID greater than 1 week ago.  Myalgias, cough not productive green sputum, headache.  She has a history of migraines.  States this headache today is worse than her typical migraine.  No vision changes, numbness or weakness.  No chest pain, does feel short of breath when she coughs.  She has had some nausea and vomiting.  Has taken ibuprofen  without relief.  No pain or swelling to lower legs.  No history of PE or DVT.  No sudden onset thunderclap headache.  Headache not worse with position changes.  No neck stiffness, neck rigidity.  Finished Paxlovid  3 days ago   HPI     Prior to Admission medications   Medication Sig Start Date End Date Taking? Authorizing Provider  albuterol  (VENTOLIN  HFA) 108 (90 Base) MCG/ACT inhaler Inhale 2 puffs into the lungs every 6 (six) hours as needed for wheezing or shortness of breath. 10/05/22   [place], Quintin PARAS, MD  butalbital -acetaminophen -caffeine  (FIORICET ) 50-325-40 MG tablet Take 1 tablet by mouth every 6 (six) hours as needed for headache. 03/31/23   Towana Small, FNP  Continuous Glucose Sensor (FREESTYLE LIBRE 14 DAY SENSOR) MISC Apply upper deltoid every 14 days, use reader to determine blood sugars. 08/09/23   CaudleThersia Bitters, FNP  diltiazem  2 % GEL With 5% lidocaine . Apply to rectum 3 times daily for 6-8 weeks. Patient not taking: Reported on 11/26/2023 08/27/23   Mollie Nestor HERO, PA-C  FLUoxetine  (PROZAC ) 10 MG capsule Take 1 capsule (10 mg total) by mouth daily. 08/09/23   Caudle, Thersia Bitters, FNP  fluticasone  (FLONASE ) 50 MCG/ACT nasal spray Place 2 sprays into both nostrils daily. 01/06/24   White, Elizabeth A, PA-C  glipiZIDE  (GLUCOTROL  XL) 5 MG 24 hr tablet Take 1  tablet (5 mg total) by mouth daily with breakfast. 11/11/23   Caudle, Thersia Bitters, FNP  Glucose Blood (BLOOD GLUCOSE TEST STRIPS) STRP Use to check blood sugar twice daily. May substitute to any manufacturer covered by patient's insurance. Patient doesn't know name of glucometer 11/29/23   Caudle, Thersia Bitters, FNP  ibuprofen  (ADVIL ) 800 MG tablet Take 1 tablet (800 mg total) by mouth every 8 (eight) hours as needed. 01/16/23     insulin  degludec (TRESIBA  FLEXTOUCH) 100 UNIT/ML FlexTouch Pen Inject 24 Units into the skin 2 (two) times daily. 11/29/23   Knute Thersia Bitters, FNP  Insulin  Pen Needle (1ST TIER UNIFINE PENTIPS) 32G X 4 MM MISC Use as directed with Tresiba  Flextouch pen 05/26/23   Towana Small, FNP  Lancets Misc. MISC Use to check blood sugar twice daily. May substitute to any manufacturer covered by patient's insurance. Patient does not know name of device. 11/29/23   Caudle, Thersia Bitters, FNP  metFORMIN  (GLUCOPHAGE ) 500 MG tablet Take 2 tablets (1,000 mg total) by mouth daily with breakfast AND 2 tablets (1,000 mg total) every evening. 02/16/23   Butler, Kristina, FNP  Multiple Vitamin (MULTIVITAMIN WITH MINERALS) TABS tablet Take 1 tablet by mouth daily in the afternoon.    [provider]  nystatin  (MYCOSTATIN /NYSTOP ) powder Apply 1 Application topically 3 (three) times daily as needed (for moisture and vaginal yeast recurrence .). Patient not taking: Reported on 11/26/2023 11/10/23   Knute Thersia Bitters, FNP  nystatin -triamcinolone  ointment (MYCOLOG) Apply 1 Application topically 2 (two) times daily. Patient not taking: Reported on 11/26/2023 11/10/23   Caudle, Alexis Olivia, FNP  promethazine -dextromethorphan (PROMETHAZINE -DM) 6.25-15 MG/5ML syrup Take 5 mLs by mouth every 8 (eight) hours as needed for cough. 01/06/24   White, Elizabeth A, PA-C  Semaglutide ,0.25 or 0.5MG /DOS, (OZEMPIC , 0.25 OR 0.5 MG/DOSE,) 2 MG/3ML SOPN Inject 0.25 mg into the skin once a week for 30 days, THEN  0.5 mg once a week. 12/09/23 02/07/24  Caudle, Thersia Bitters, FNP  atorvastatin  (LIPITOR) 20 MG tablet Take 1 tablet (20 mg total) by mouth at bedtime. 03/04/23 10/13/23  Towana Small, FNP    Allergies: Patient has no known allergies.    Review of Systems  Constitutional:  Positive for activity change, appetite change and fatigue.  HENT:  Positive for congestion. Negative for sore throat, trouble swallowing and voice change.   Respiratory:  Positive for cough and shortness of breath (with coughing). Negative for apnea, choking, chest tightness, wheezing and stridor.   Cardiovascular: Negative.   Gastrointestinal:  Positive for nausea and vomiting. Negative for abdominal pain, diarrhea and rectal pain.  Genitourinary: Negative.  Negative for difficulty urinating.  Musculoskeletal:  Positive for myalgias.  Skin: Negative.   Neurological:  Positive for headaches.    Updated Vital Signs BP (!) (P) 113/49   Pulse (P) 95   Temp (P) 98.5 F (36.9 C) (Oral)   Resp (P) 16   LMP 09/20/2019 (Exact Date)   SpO2 (P) 98%   Physical Exam Vitals and nursing note reviewed.  Constitutional:      General: She is not in acute distress.    Appearance: She is well-developed. She is not ill-appearing, toxic-appearing or diaphoretic.  HENT:     Head: Normocephalic and atraumatic.     Nose: Nose normal.     Mouth/Throat:     Mouth: Mucous membranes are moist.  Eyes:     Pupils: Pupils are equal, round, and reactive to light.  Cardiovascular:     Rate and Rhythm: Normal rate.     Pulses: Normal pulses.     Heart sounds: Normal heart sounds.  Pulmonary:     Effort: Pulmonary effort is normal. No respiratory distress.     Breath sounds: Normal breath sounds.     Comments: Minimal rhonchi on right lower, clears with cough Abdominal:     General: Bowel sounds are normal. There is no distension.     Palpations: Abdomen is soft.     Tenderness: There is no abdominal tenderness. There is no right CVA  tenderness, left CVA tenderness, guarding or rebound.  Musculoskeletal:        General: No swelling, tenderness, deformity or signs of injury. Normal range of motion.     Cervical back: Normal range of motion.     Right lower leg: No edema.     Left lower leg: No edema.  Skin:    General: Skin is warm and dry.     Capillary Refill: Capillary refill takes less than 2 seconds.  Neurological:     General: No focal deficit present.     Mental Status: She is alert.     Cranial Nerves: No cranial nerve deficit.     Sensory: No sensory deficit.     Motor: No weakness.     Gait: Gait normal.  Psychiatric:        Mood and Affect: Mood normal.     (all labs ordered are listed, but only  abnormal results are displayed) Labs Reviewed  CBC WITH DIFFERENTIAL/PLATELET - Abnormal; Notable for the following components:      Result Value   WBC 12.0 (*)    All other components within normal limits  COMPREHENSIVE METABOLIC PANEL WITH GFR - Abnormal; Notable for the following components:   Sodium 134 (*)    CO2 21 (*)    Glucose, Bld 290 (*)    AST 165 (*)    ALT 201 (*)    Alkaline Phosphatase 181 (*)    All other components within normal limits  MAGNESIUM  - Abnormal; Notable for the following components:   Magnesium  1.6 (*)    All other components within normal limits  LIPASE, BLOOD    EKG: None  Radiology: CT Head Wo Contrast Result Date: 01/14/2024 CLINICAL DATA:  Headaches EXAM: CT HEAD WITHOUT CONTRAST TECHNIQUE: Contiguous axial images were obtained from the base of the skull through the vertex without intravenous contrast. RADIATION DOSE REDUCTION: This exam was performed according to the departmental dose-optimization program which includes automated exposure control, adjustment of the mA and/or kV according to patient size and/or use of iterative reconstruction technique. COMPARISON:  03/29/2023 FINDINGS: Brain: No evidence of acute infarction, hemorrhage, hydrocephalus, extra-axial  collection or mass lesion/mass effect. Vascular: No hyperdense vessel or unexpected calcification. Skull: Normal. Negative for fracture or focal lesion. Sinuses/Orbits: No acute finding. Other: None. IMPRESSION: No acute intracranial abnormality noted. Electronically Signed   By: Oneil Devonshire M.D.   On: 01/14/2024 22:37   DG Chest Portable 1 View Result Date: 01/14/2024 CLINICAL DATA:  cough, covid, cp EXAM: PORTABLE CHEST - 1 VIEW COMPARISON:  January 06, 2024 FINDINGS: No focal airspace consolidation, pleural effusion, or pneumothorax. No cardiomegaly. No acute fracture or destructive lesion. Partially visualized cervical fusion hardware. Multilevel thoracic osteophytosis. IMPRESSION: No acute cardiopulmonary abnormality. Electronically Signed   By: Rogelia Myers M.D.   On: 01/14/2024 22:28     Procedures   Medications Ordered in the ED  prochlorperazine  (COMPAZINE ) injection 10 mg (10 mg Intravenous Given 01/14/24 2334)  diphenhydrAMINE  (BENADRYL ) injection 12.5 mg (12.5 mg Intravenous Given 01/14/24 2329)  sodium chloride  0.9 % bolus 1,000 mL (1,000 mLs Intravenous New Bag/Given 01/14/24 2328)  dexamethasone  (DECADRON ) injection 10 mg (10 mg Intravenous Given 01/14/24 2332)  ketorolac  (TORADOL ) 15 MG/ML injection 15 mg (15 mg Intravenous Given 01/14/24 2341)    Here for evaluation of feeling unwell.  8 days ago diagnosed with COVID.  2 days ago developed severe headache, nausea, vomiting.  Has tingling all over.  History of migraine however not typically this severe.  No vision changes.  Has shortness of breath with cough.  Recently completed Paxlovid .  Here she is afebrile, nonseptic, non-ill-appearing.  She is a nonfocal neuroexam.  No chest pain.  Will plan on migraine cocktail, labs, imaging and reassess  Labs and imaging personally viewed and interpreted:  CT head without significant finding Chest x-ray without cardiomegaly, pulm edema, CBC leukocytosis 12.0 Metabolic panel AST 165, ALT 201, alk  phos 181--history of elevated LFT Lipase 25 Mag 1.6  Patient reassessed.  Has not gone her migraine cocktail at this time.  She has a benign abdominal exam.  Has had elevated LFTs in the past.--Prior cholecystectomy.  Care transferred to Dr. Roselyn who will FU on reassessment and dispo.  Medical Decision Making Amount and/or Complexity of Data Reviewed Independent Historian: spouse External Data Reviewed: labs, radiology, ECG and notes. Labs: ordered. Decision-making details documented in ED Course. Radiology: ordered and independent interpretation performed. Decision-making details documented in ED Course. ECG/medicine tests: ordered and independent interpretation performed. Decision-making details documented in ED Course.  Risk OTC drugs. Prescription drug management. Parenteral controlled substances. Decision regarding hospitalization. Diagnosis or treatment significantly limited by social determinants of health.        Final diagnoses:  COVID  Acute nonintractable headache, unspecified headache type    ED Discharge Orders     None          Elenie Coven A, PA-C 01/14/24 2353    Dasie Faden, MD 01/17/24 [phone]

## 2024-01-14 NOTE — ED Notes (Signed)
 Pt was found alert and oriented in bed w/ female visitor bedside.  Pt was medicated for a 9/10 headache.  Pt also requested and was given gingerale to drink (visitor as well.) No issues at this time.

## 2024-01-15 NOTE — ED Provider Notes (Signed)
 Care assumed at shift change pending reassessment after migraine cocktail. She is feeling better and ready to go home. PCP follow up, RTED for any other concerns.     Roselyn Carlin NOVAK, MD 01/15/24 TIANA

## 2024-01-18 ENCOUNTER — Telehealth (HOSPITAL_BASED_OUTPATIENT_CLINIC_OR_DEPARTMENT_OTHER): Payer: Self-pay | Admitting: *Deleted

## 2024-01-18 NOTE — Telephone Encounter (Signed)
-----   Message from Thersia Bitters Caudle sent at 12/24/2023 11:23 AM EDT ----- RICK both medications on the way to clinic. ----- Message ----- From: Cleatus Dorcas SAUNDERS, [facility] Sent: 12/24/2023  11:19 AM EDT To: Thersia Bitters Stark, FNP  Hi,   Ozempic  order reprocessed and should ship in 10-14 days. Tresiba  order should be on its way to the clinic.   Thanks,  Dorcas Cleatus, PharmD Clinical Pharmacist Cell: [phone]

## 2024-01-18 NOTE — Telephone Encounter (Signed)
 Received pt's ozempic  today 01/18/24. Called and spoke with pt letting her know that this came and is ready for her to come and pick up and she verbalized understanding stating that she will either come today 8/12 or tomorrow 8/13.  Routing to [employer] as an Financial planner.

## 2024-01-18 NOTE — Telephone Encounter (Signed)
 Gave pt a call to follow up on PAP Novo Nordisk Ozempic  left a HIPAA MV.

## 2024-01-20 NOTE — Telephone Encounter (Signed)
 Gave pt a call  pt is coming up due for re-enrollment,left a HIPAA VM.

## 2024-01-21 NOTE — Telephone Encounter (Signed)
 Gave pt a call to follow up on PAP pt is coming up due for re-enrollment,pt did not answer left a HIPAA VM.

## 2024-01-28 NOTE — Telephone Encounter (Signed)
 Gave pt a call to follow up on upcoming PAP Novo Nordisk Ozempic  reenrollment,spoke with pt gave consent to fill pap online and have fax provider portion today.

## 2024-02-01 ENCOUNTER — Other Ambulatory Visit (HOSPITAL_BASED_OUTPATIENT_CLINIC_OR_DEPARTMENT_OTHER): Payer: Self-pay

## 2024-02-01 ENCOUNTER — Other Ambulatory Visit (HOSPITAL_BASED_OUTPATIENT_CLINIC_OR_DEPARTMENT_OTHER): Payer: Self-pay | Admitting: Family Medicine

## 2024-02-01 ENCOUNTER — Ambulatory Visit (INDEPENDENT_AMBULATORY_CARE_PROVIDER_SITE_OTHER): Payer: Self-pay | Admitting: Family Medicine

## 2024-02-01 ENCOUNTER — Encounter (HOSPITAL_BASED_OUTPATIENT_CLINIC_OR_DEPARTMENT_OTHER): Payer: Self-pay | Admitting: Family Medicine

## 2024-02-01 VITALS — BP 108/64 | HR 89 | Ht 61.0 in | Wt 239.0 lb

## 2024-02-01 DIAGNOSIS — F32A Depression, unspecified: Secondary | ICD-10-CM

## 2024-02-01 DIAGNOSIS — R1031 Right lower quadrant pain: Secondary | ICD-10-CM

## 2024-02-01 DIAGNOSIS — E1165 Type 2 diabetes mellitus with hyperglycemia: Secondary | ICD-10-CM

## 2024-02-01 DIAGNOSIS — Z794 Long term (current) use of insulin: Secondary | ICD-10-CM

## 2024-02-01 DIAGNOSIS — F419 Anxiety disorder, unspecified: Secondary | ICD-10-CM

## 2024-02-01 DIAGNOSIS — R102 Pelvic and perineal pain: Secondary | ICD-10-CM

## 2024-02-01 LAB — POCT URINALYSIS DIP (CLINITEK)
Bilirubin, UA: NEGATIVE
Blood, UA: NEGATIVE
Glucose, UA: NEGATIVE mg/dL
Ketones, POC UA: NEGATIVE mg/dL
Leukocytes, UA: NEGATIVE
Nitrite, UA: NEGATIVE
POC PROTEIN,UA: NEGATIVE
Spec Grav, UA: 1.02 (ref 1.010–1.025)
Urobilinogen, UA: 0.2 U/dL
pH, UA: 7 (ref 5.0–8.0)

## 2024-02-01 LAB — POCT UA - MICROALBUMIN
Albumin/Creatinine Ratio, Urine, POC: <30
Creatinine, POC: 100 mg/dL
Microalbumin Ur, POC: 30 mg/L

## 2024-02-01 LAB — GLUCOSE, POCT (MANUAL RESULT ENTRY): POC Glucose: 171 mg/dL — AB (ref 70–99)

## 2024-02-01 MED ORDER — FREESTYLE LIBRE 14 DAY SENSOR MISC: 1.0000 | Status: DC

## 2024-02-01 MED ORDER — METFORMIN HCL 500 MG PO TABS
ORAL_TABLET | ORAL | 3 refills | Status: DC
  Filled 2024-02-01: qty 120, 30d supply, fill #0

## 2024-02-01 MED ORDER — FREESTYLE LIBRE 14 DAY SENSOR MISC: 1.0000 | 11 refills | Status: AC

## 2024-02-01 MED ORDER — GLIPIZIDE ER 5 MG PO TB24: 5.0000 mg | ORAL_TABLET | Freq: Every day | ORAL | 3 refills | Status: DC

## 2024-02-01 MED ORDER — METFORMIN HCL 500 MG PO TABS
ORAL_TABLET | ORAL | 3 refills | Status: DC
Start: 2024-02-01 — End: 2024-02-01

## 2024-02-01 NOTE — Patient Instructions (Addendum)
 STOP GLIPIZIDE - DO NOT PICK UP FROM PHARMACY   Starting Ozempic  0.25mg  once weekly for 4 weeks.   Increase your clear fluids, fiber intake (supplement if needed) and stool softener (colace if needed).   Monitor your glucose readings:   If you are having low glucose readings (less than 80) or symptoms of low blood sugar (dizziness, sweating, nausea) call the office and let me know.   If your blood sugar is consistently lower than 130, please let me know.    Hypoglycemia: Continue to adjust every 3-7 days until hypoglycemia resolved.  - reduce insulin  down 2-4 units

## 2024-02-01 NOTE — Telephone Encounter (Unsigned)
 Copied from CRM [id number]. Topic: Clinical - Medication Refill >> Feb 01, 2024  5:01 PM Alexandra Norris wrote: Medication: Continuous Glucose Sensor (FREESTYLE LIBRE 14 DAY SENSOR) MISC KIT   Has the patient contacted their pharmacy? Yes (Agent: If no, request that the patient contact the pharmacy for the refill. If patient does not wish to contact the pharmacy document the reason why and proceed with request.) (Agent: If yes, when and what did the pharmacy advise?)  This is the patient's preferred pharmacy:  [facility]... [address], [place], [place] [address] [phone] [phone]  Is this the correct pharmacy for this prescription? Yes If no, delete pharmacy and type the correct one.   Has the prescription been filled recently? Yes  Is the patient out of the medication? Yes  Has the patient been seen for an appointment in the last year OR does the patient have an upcoming appointment? Yes  Can we respond through MyChart? Yes  Agent: Please be advised that Rx refills may take up to 3 business days. We ask that you follow-up with your pharmacy. >> Feb 01, 2024  5:08 PM Alexandra Norris wrote: Please update to : Continuous Glucose Sensor (FREESTYLE LIBRE 14 DAY SENSOR Plus III Plus) MISC KIT

## 2024-02-01 NOTE — Telephone Encounter (Unsigned)
 Copied from CRM [id number]. Topic: Clinical - Medication Refill >> Feb 01, 2024  5:01 PM Zebedee SAUNDERS wrote: Medication: Continuous Glucose Sensor (FREESTYLE LIBRE 14 DAY SENSOR) MISC KIT   Has the patient contacted their pharmacy? Yes (Agent: If no, request that the patient contact the pharmacy for the refill. If patient does not wish to contact the pharmacy document the reason why and proceed with request.) (Agent: If yes, when and what did the pharmacy advise?)  This is the patient's preferred pharmacy:  [facility]... [address], [place], [place] [address] [phone] [phone]  Is this the correct pharmacy for this prescription? Yes If no, delete pharmacy and type the correct one.   Has the prescription been filled recently? Yes  Is the patient out of the medication? Yes  Has the patient been seen for an appointment in the last year OR does the patient have an upcoming appointment? Yes  Can we respond through MyChart? Yes  Agent: Please be advised that Rx refills may take up to 3 business days. We ask that you follow-up with your pharmacy.

## 2024-02-01 NOTE — Progress Notes (Unsigned)
 Subjective:   Alexandra Norris 10-24-1975 02/01/2024  Chief Complaint  Patient presents with   Medical Management of Chronic Issues    6-week follow up; pt has been having pain on lower part of abdomen that will go down to waist which will go from a sharp pain to throbbing pain.    HPI: Alexandra Norris presents today for re-assessment and management of chronic medical conditions.   ABDOMINAL PAIN: Onset: 3 months ago Location:  Right lower pelvis  Patient states she has intermittent RLQ pain ongoing with movement. She denies nausea, vomiting, dysuria, constipation or diarrhea. Denies weight loss. She states pain worsens with movement. She is s/p appendectomy and s/p TAH with salpingectomy.   DIABETES MELLITUS: Alexandra Norris presents for the medical management of diabetes.  Current diabetes medication regimen: Metformin  1000mg  BID, Glipizide  XR 5mg , Tresiba  24 units BID, (starting Ozempic  0.25mg  x 4 weeks this week with financial assistance. Medication given to patient from PCP office for 4 weeks.)  Patient is  adhering to a diabetic diet.  Patient is  exercising regularly.  Patient is not checking BS regularly. She has not been able to pick up CGM due to cost. She is requesting to send to [facility] due to having financial assistance there. Will send today. Sample provided in office and education by PCP provided. She reports having 1-2 episodes of hypoglycemia recently that resolved with eating within 30 minutes. She did not check her glucose at the time.   Patient is  checking their feet regularly.  Denies polydipsia, polyphagia, polyuria, open wounds or ulcers on feet.   Lab Results  Component Value Date   HGBA1C 13.1 (H) 11/10/2023    Foot Exam: 02/01/2024 Lab Results  Component Value Date   LABMICR 6.2 08/07/2020   MICROALBUR 30 02/01/2024   MICROALBUR 10 04/22/2021    Wt Readings from Last 3 Encounters:  02/01/24  239 lb (108.4 kg)  11/10/23 230 lb (104.3 kg)  08/27/23 241 lb (109.3 kg)    ANXIETY: Alexandra Norris presents for the medical management of anxiety.  Current medication regimen: Previously on Fluoxetine  10mg - no longer taking. Patient states she is doing well without currently.  Counseling: Recommended.  Well controlled: Yes, per patient. Denies concerns.  Denies SI/HI.     02/01/2024    2:06 PM 11/10/2023   10:25 AM 08/09/2023   10:27 AM 03/09/2023   11:30 AM  GAD 7 : Generalized Anxiety Score  Nervous, Anxious, on Edge 3 3 1 2   Control/stop worrying 3 3 2 2   Worry too much - different things 1 2 2 2   Trouble relaxing 0 0 2 1  Restless 0 0 2 1  Easily annoyed or irritable 0 0 3 1  Afraid - awful might happen 0 0 0 0  Total GAD 7 Score 7 8 12 9   Anxiety Difficulty Somewhat difficult Somewhat difficult Somewhat difficult Somewhat difficult      02/01/2024    2:06 PM 11/10/2023   10:25 AM 08/09/2023   10:24 AM  PHQ9 SCORE ONLY  PHQ-9 Total Score 6 7  13       Data saved with a previous flowsheet row definition      The following portions of the patient's history were reviewed and updated as appropriate: past medical history, past surgical history, family history, social history, allergies, medications, and problem list.   Patient Active Problem List   Diagnosis Date Noted   Family history of  colon cancer 05/25/2023   Benign neoplasm of colon 05/25/2023   Acute intractable headache 03/31/2023   Type 2 diabetes mellitus with hyperglycemia, with long-term current use of insulin  (HCC) 07/28/2022   Asthma due to seasonal allergies 09/25/2021   Arthritis of right acromioclavicular joint 04/24/2021   Chronic left shoulder pain 04/22/2021   Hypertension associated with diabetes (HCC) 04/22/2021   Hyperlipidemia associated with type 2 diabetes mellitus (HCC) 04/22/2021   Fatty liver disease, nonalcoholic 09/09/2020   Vitamin D  deficiency 09/25/2015   Anxiety and  depression 07/06/2014   GASTROESOPHAGEAL REFLUX DISEASE 08/14/2009   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 level with radiculopathy 04/03/2020   Cervical post-laminectomy syndrome 03/06/2022   Cubital tunnel syndrome on right 12/02/2021   Depression    DEPRESSION, SITUATIONAL, PROLONGED 02/05/2009   Qualifier: Diagnosis of  By: Reynaldo  MD, Khary     Diabetes mellitus without complication (HCC)    Dizziness 04/22/2021   Dyspnea    Elevated blood-pressure reading, without diagnosis of hypertension 06/17/2020   Endometriosis    Fatigue 04/22/2021   History of blood transfusion 1999   w/vaginal delivery   History of bronchitis    I've stayed in hospital 2 X w/bronchitis   Hyperlipidemia    Hypertension    Impingement syndrome of left shoulder    Influenza vaccine needed 04/03/2020   Left carpal tunnel syndrome 07/11/2019   Migraine    Moderate persistent asthma with acute exacerbation 12/06/2006   Qualifier: Diagnosis of  By: Reynaldo  MD, Khary     Nasal obstruction 02/18/2016   Formatting of this note might be different from the original.  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   Pre-diabetes 10/03/2019   A1C 6.4   Right carpal tunnel syndrome 07/11/2019   S/P arthroscopy of right shoulder 07/08/2021   S/P carpal tunnel release 08/30/2019   Seasonal allergies 10/08/2016   Shingles 11/14/2020   Sinusitis, bacterial 10/07/2021   Snoring 01/20/2022   Past Surgical History:  Procedure Laterality Date   ABDOMINAL HYSTERECTOMY  10/17/2019   APPENDECTOMY     CARPAL TUNNEL RELEASE Right 07/19/2019   Procedure: RIGHT CARPAL TUNNEL RELEASE;  Surgeon: Jerri Kay HERO, MD;  Location: [facility];  Service: Orthopedics;  Laterality: Right;   CARPAL TUNNEL RELEASE Left 09/13/2019   Procedure: LEFT CARPAL TUNNEL RELEASE;  Surgeon: Jerri Kay HERO, MD;  Location: [facility];  Service: Orthopedics;  Laterality: Left;  Bier block   CESAREAN SECTION  1997; 2008   CHOLECYSTECTOMY  2011   COLONOSCOPY WITH PROPOFOL  N/A 05/25/2023   Procedure: COLONOSCOPY WITH PROPOFOL ;  Surgeon: Leigh Elspeth SQUIBB, MD;  Location: [facility];  Service: Gastroenterology;  Laterality: N/A;   POLYPECTOMY  05/25/2023   Procedure: POLYPECTOMY;  Surgeon: Leigh Elspeth SQUIBB, MD;  Location: [facility];  Service: Gastroenterology;;   SHOULDER ARTHROSCOPY WITH DISTAL CLAVICLE RESECTION  02/12/2021   Procedure: SHOULDER ARTHROSCOPY WITH DISTAL CLAVICLE RESECTION;  Surgeon: Jerri Kay HERO, MD;  Location: [facility];  Service: Orthopedics;;   SHOULDER ARTHROSCOPY WITH ROTATOR CUFF REPAIR AND SUBACROMIAL DECOMPRESSION Left 02/12/2021  Procedure: LEFT SHOULDER ARTHROSCOPY WITH ROTATOR CUFF REPAIR, SUBACROMIAL DECOMPRESSION, EXTENSIVE DEBRIDEMENT;  Surgeon: Jerri Kay HERO, MD;  Location: [facility];  Service: Orthopedics;  Laterality: Left;   TONSILLECTOMY     tonsilletomy     TUBAL LIGATION  04/2007   ULNAR TUNNEL RELEASE Right 12/17/2021   Procedure: RIGHT CUBITAL TUNNEL RELEASE;  Surgeon: Jerri Kay HERO, MD;  Location: [facility];  Service: Orthopedics;  Laterality: Right;   VAGINAL HYSTERECTOMY Bilateral 10/17/2019   Procedure: HYSTERECTOMY VAGINAL WITH SALPINGECTOMY;  Surgeon: Lorence Ozell CROME, MD;  Location: [facility];  Service: Gynecology;  Laterality: Bilateral;   Family History  Problem Relation Age of Onset   Diabetes Mother    Osteoarthritis Mother    Hypertension Mother    Hyperlipidemia Mother    Arthritis Mother    Colon cancer Mother    Colon polyps Neg Hx    Esophageal cancer Neg Hx    Stomach cancer Neg Hx    Ulcerative colitis Neg  Hx    Breast cancer Neg Hx    Outpatient Medications Prior to Visit  Medication Sig Dispense Refill   Glucose Blood (BLOOD GLUCOSE TEST STRIPS) STRP Use to check blood sugar twice daily. May substitute to any manufacturer covered by patient's insurance. Patient doesn't know name of glucometer 100 strip 0   insulin  degludec (TRESIBA  FLEXTOUCH) 100 UNIT/ML FlexTouch Pen Inject 24 Units into the skin 2 (two) times daily. 9 mL 1   Insulin  Pen Needle (1ST TIER UNIFINE PENTIPS) 32G X 4 MM MISC Use as directed with Tresiba  Flextouch pen 100 each 12   Lancets Misc. MISC Use to check blood sugar twice daily. May substitute to any manufacturer covered by patient's insurance. Patient does not know name of device. 100 each 0   Multiple Vitamin (MULTIVITAMIN WITH MINERALS) TABS tablet Take 1 tablet by mouth daily in the afternoon.     Semaglutide ,0.25 or 0.5MG /DOS, (OZEMPIC , 0.25 OR 0.5 MG/DOSE,) 2 MG/3ML SOPN Inject 0.25 mg into the skin once a week for 30 days, THEN 0.5 mg once a week.     albuterol  (VENTOLIN  HFA) 108 (90 Base) MCG/ACT inhaler Inhale 2 puffs into the lungs every 6 (six) hours as needed for wheezing or shortness of breath. 6.7 g 0   butalbital -acetaminophen -caffeine  (FIORICET ) 50-325-40 MG tablet Take 1 tablet by mouth every 6 (six) hours as needed for headache. 14 tablet 0   Continuous Glucose Sensor (FREESTYLE LIBRE 14 DAY SENSOR) MISC Apply upper deltoid every 14 days, use reader to determine blood sugars. 2 each 11   FLUoxetine  (PROZAC ) 10 MG capsule Take 1 capsule (10 mg total) by mouth daily. 30 capsule 3   fluticasone  (FLONASE ) 50 MCG/ACT nasal spray Place 2 sprays into both nostrils daily. 9.9 mL 2   glipiZIDE  (GLUCOTROL  XL) 5 MG 24 hr tablet Take 1 tablet (5 mg total) by mouth daily with breakfast. 30 tablet 3   ibuprofen  (ADVIL ) 800 MG tablet Take 1 tablet (800 mg total) by mouth every 8 (eight) hours as needed. 30 tablet 0   metFORMIN  (GLUCOPHAGE ) 500 MG tablet Take 2 tablets (1,000  mg total) by mouth daily with breakfast AND 2 tablets (1,000 mg total) every evening. 270 tablet 3   promethazine -dextromethorphan (PROMETHAZINE -DM) 6.25-15 MG/5ML syrup Take 5 mLs by mouth every 8 (eight) hours as needed for cough. 180 mL 0   diltiazem  2 % GEL With 5% lidocaine . Apply to rectum 3 times daily for 6-8 weeks. (Patient not taking:  Reported on 11/26/2023) 30 g 1   nystatin  (MYCOSTATIN /NYSTOP ) powder Apply 1 Application topically 3 (three) times daily as needed (for moisture and vaginal yeast recurrence .). (Patient not taking: Reported on 11/26/2023) 30 g 5   nystatin -triamcinolone  ointment (MYCOLOG) Apply 1 Application topically 2 (two) times daily. (Patient not taking: Reported on 11/26/2023) 60 g 3   No facility-administered medications prior to visit.   No Known Allergies   ROS: A complete ROS was performed with pertinent positives/negatives noted in the HPI. The remainder of the ROS are negative.    Objective:   Today's Vitals   02/01/24 1402  BP: 108/64  Pulse: 89  SpO2: 98%  Weight: 239 lb (108.4 kg)  Height: 5' 1 (1.549 m)    Physical Exam   GENERAL: Well-appearing, in NAD. Well nourished.  SKIN: Pink, warm and dry.  Head: Normocephalic. NECK: Trachea midline. Full ROM w/o pain or tenderness.  RESPIRATORY: Chest wall symmetrical. Respirations even and non-labored. Breath sounds clear to auscultation bilaterally.  CARDIAC: S1, S2 present, regular rate and rhythm without murmur or gallops. Peripheral pulses 2+ bilaterally.  GI: Abdomen soft, non-tender. Small palpable nodule present to RLQ with palpation and tenderness.  Normoactive bowel sounds. No rebound tenderness. No hepatomegaly or splenomegaly. No CVA tenderness.  MSK: Muscle tone and strength appropriate for age.  NEUROLOGIC: No motor or sensory deficits. Steady, even gait. C2-C12 intact.  PSYCH/MENTAL STATUS: Alert, oriented x 3. Cooperative, appropriate mood and affect.   Diabetic Foot Exam - Simple    Simple Foot Form Diabetic Foot exam was performed with the following findings: Yes 02/01/2024  3:20 PM  Visual Inspection No deformities, no ulcerations, no other skin breakdown bilaterally: Yes Sensation Testing Intact to touch and monofilament testing bilaterally: Yes Pulse Check Posterior Tibialis and Dorsalis pulse intact bilaterally: Yes Comments    Results for orders placed or performed in visit on 02/01/24  POCT URINALYSIS DIP (CLINITEK)  Result Value Ref Range   Color, UA yellow yellow   Clarity, UA clear clear   Glucose, UA negative negative mg/dL   Bilirubin, UA negative negative   Ketones, POC UA negative negative mg/dL   Spec Grav, UA 8.979 8.989 - 1.025   Blood, UA negative negative   pH, UA 7.0 5.0 - 8.0   POC PROTEIN,UA negative negative, trace   Urobilinogen, UA 0.2 0.2 or 1.0 E.U./dL   Nitrite, UA Negative Negative   Leukocytes, UA Negative Negative  POCT UA - Microalbumin  Result Value Ref Range   Microalbumin Ur, POC 30 mg/L   Creatinine, POC 100 mg/dL   Albumin/Creatinine Ratio, Urine, POC <30   POCT Glucose (CBG)  Result Value Ref Range   POC Glucose 171 (A) 70 - 99 mg/dl    The 89-bzjm ASCVD risk score (Arnett DK, et al., 2019) is: 1.6%     Assessment & Plan:  1. Type 2 diabetes mellitus with hyperglycemia, with long-term current use of insulin  (HCC)  - Continuous Glucose Sensor (FREESTYLE LIBRE 14 DAY SENSOR) MISC; Apply upper deltoid every 14 days, use reader to determine blood sugars.  Dispense: 2 each; Refill: 11 - POCT URINALYSIS DIP (CLINITEK) - POCT UA - Microalbumin - POCT Glucose (CBG) - metFORMIN  (GLUCOPHAGE ) 500 MG tablet; Take 2 tablets (1,000 mg total) by mouth daily with breakfast AND 2 tablets (1,000 mg total) every evening.  Dispense: 270 tablet; Refill: 3  2. Pelvic pain (Primary) *** - US  Abdomen Limited; Future  3. RLQ abdominal pain *** - US   Abdomen Limited; Future  4. Anxiety and depression ***    Meds ordered  this encounter  Medications   Continuous Glucose Sensor (FREESTYLE LIBRE 14 DAY SENSOR) MISC    Sig: Apply upper deltoid every 14 days, use reader to determine blood sugars.    Dispense:  2 each    Refill:  11    Supervising Provider:   DE PERU, RAYMOND J [[id number]]   DISCONTD: glipiZIDE  (GLUCOTROL  XL) 5 MG 24 hr tablet    Sig: Take 1 tablet (5 mg total) by mouth daily with breakfast.    Dispense:  60 tablet    Refill:  3    Supervising Provider:   DE PERU, RAYMOND J [[id number]]   DISCONTD: metFORMIN  (GLUCOPHAGE ) 500 MG tablet    Sig: Take 2 tablets (1,000 mg total) by mouth daily with breakfast AND 2 tablets (1,000 mg total) every evening.    Dispense:  270 tablet    Refill:  3    Supervising Provider:   DE PERU, RAYMOND J [[id number]]   Continuous Glucose Sensor (FREESTYLE LIBRE 14 DAY SENSOR) MISC    Sig: 1 Application by Does not apply route every 14 (fourteen) days. Apply upper deltoid every 14 days, use reader to determine blood sugars    Supervising Provider:   DE PERU, RAYMOND J [[id number]]    Lot Number?:   [id number]    Expiration Date?:   04/07/2024    Manufacturer?:   [employer] [1]    Quantity:   1   metFORMIN  (GLUCOPHAGE ) 500 MG tablet    Sig: Take 2 tablets (1,000 mg total) by mouth daily with breakfast AND 2 tablets (1,000 mg total) every evening.    Dispense:  270 tablet    Refill:  3    Supervising Provider:   DE PERU, RAYMOND J [[id number]]   Lab Orders         POCT URINALYSIS DIP (CLINITEK)         POCT UA - Microalbumin         POCT Glucose (CBG)     No images are attached to the encounter or orders placed in the encounter.  Return in about 2 months (around 04/02/2024) for DIABETES CHECK UP.    Patient to reach out to office if new, worrisome, or unresolved symptoms arise or if no improvement in patient's condition. Patient verbalized understanding and is agreeable to treatment plan. All questions answered to patient's satisfaction.    Alexandra Norris, [place]

## 2024-02-02 NOTE — Telephone Encounter (Signed)
 Received provider portion PAP [employer] Ozempic , faxed it to [employer] today will follow up in a few days.

## 2024-02-03 ENCOUNTER — Ambulatory Visit (HOSPITAL_BASED_OUTPATIENT_CLINIC_OR_DEPARTMENT_OTHER): Payer: Self-pay | Admitting: Family Medicine

## 2024-02-03 ENCOUNTER — Ambulatory Visit (HOSPITAL_BASED_OUTPATIENT_CLINIC_OR_DEPARTMENT_OTHER)
Admission: RE | Admit: 2024-02-03 | Discharge: 2024-02-03 | Disposition: A | Discharge status: Home / Self Care | Payer: Self-pay | Source: Ambulatory Visit | Attending: Family Medicine | Admitting: Family Medicine

## 2024-02-03 DIAGNOSIS — R1031 Right lower quadrant pain: Secondary | ICD-10-CM | POA: Insufficient documentation

## 2024-02-03 DIAGNOSIS — R102 Pelvic and perineal pain: Secondary | ICD-10-CM | POA: Insufficient documentation

## 2024-02-03 NOTE — Progress Notes (Signed)
 Hi [place],  Your ultrasound was negative for any type of soft tissue mass or acute finding in the area that you have been having pain.  This may be more of a muscle strain.  Please use ibuprofen , Tylenol , massage or heat or ice and if no improvement, please reach out to the office.

## 2024-02-08 NOTE — Telephone Encounter (Signed)
 Gave [employer] a call to follow up on pt PAP Ozempic ,spoke with a representative said they have not received provider potion of the application, I have re fax provider portion today they ask to follow up in 72 hours.

## 2024-02-14 ENCOUNTER — Ambulatory Visit: Payer: Self-pay | Admitting: Orthopedic Surgery

## 2024-02-15 NOTE — Telephone Encounter (Signed)
 Gave [employer] a call to follow up on PAP Ozempic ,spoke with a representative said pt has been approved thru 02/12/2025 and will mail out refill once pt is due,left a HIPAA VM at pt number.

## 2024-02-16 ENCOUNTER — Ambulatory Visit (HOSPITAL_BASED_OUTPATIENT_CLINIC_OR_DEPARTMENT_OTHER): Payer: Self-pay | Admitting: Family Medicine

## 2024-02-22 ENCOUNTER — Ambulatory Visit (INDEPENDENT_AMBULATORY_CARE_PROVIDER_SITE_OTHER): Payer: Self-pay | Admitting: Orthopaedic Surgery

## 2024-02-22 DIAGNOSIS — G5622 Lesion of ulnar nerve, left upper limb: Secondary | ICD-10-CM | POA: Insufficient documentation

## 2024-02-22 DIAGNOSIS — G8929 Other chronic pain: Secondary | ICD-10-CM | POA: Insufficient documentation

## 2024-02-22 DIAGNOSIS — M25511 Pain in right shoulder: Secondary | ICD-10-CM
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
## 2024-03-08 ENCOUNTER — Ambulatory Visit: Payer: Self-pay | Admitting: Orthopedic Surgery

## 2024-03-09 ENCOUNTER — Telehealth: Payer: Self-pay

## 2024-03-09 ENCOUNTER — Telehealth: Payer: Self-pay | Admitting: Physician Assistant

## 2024-03-09 ENCOUNTER — Other Ambulatory Visit (HOSPITAL_BASED_OUTPATIENT_CLINIC_OR_DEPARTMENT_OTHER): Payer: Self-pay

## 2024-03-09 DIAGNOSIS — R3989 Other symptoms and signs involving the genitourinary system: Secondary | ICD-10-CM

## 2024-03-09 MED ORDER — NITROFURANTOIN MONOHYD MACRO 100 MG PO CAPS
100.0000 mg | ORAL_CAPSULE | Freq: Two times a day (BID) | ORAL | 0 refills | Status: DC
  Filled 2024-03-09: qty 10, 5d supply, fill #0

## 2024-03-09 NOTE — Progress Notes (Signed)
 Complex Care Management Care Guide Note  03/09/2024 Name: Alexandra Norris MRN: [id number] DOB: 1976/02/04  Alexandra Norris is a 48 y.o. year old female who is a primary care patient of Knute, Thersia Bitters, FNP and is actively engaged with the care management team. I reached out to Sherline Evern Sierra by phone today to assist with scheduling  with the Pharmacist.  Follow up plan: Telephone appointment with complex care management team member scheduled for:  03/14/24 at 3:00 p.m.   [facility]  [facility], [facility] VBCI Assistant Direct Dial: [phone]  Fax: [phone]

## 2024-03-09 NOTE — Patient Instructions (Signed)
 Alexandra Norris, thank you for joining Delon CHRISTELLA Dickinson, PA-C for today's virtual visit.  While this provider is not your primary care provider (PCP), if your PCP is located in our provider database this encounter information will be shared with them immediately following your visit.   A [facility] MyChart account gives you access to today's visit and all your visits, tests, and labs performed at [facility]  click here if you don't have a [facility] MyChart account or go to [email or web address]  Consent: (Patient) Alexandra Norris provided verbal consent for this virtual visit at the beginning of the encounter.  Current Medications:  Current Outpatient Medications:    nitrofurantoin , macrocrystal-monohydrate, (MACROBID ) 100 MG capsule, Take 1 capsule (100 mg total) by mouth 2 (two) times daily., Disp: 10 capsule, Rfl: 0   Continuous Glucose Sensor (FREESTYLE LIBRE 14 DAY SENSOR) MISC, Apply upper deltoid every 14 days, use reader to determine blood sugars., Disp: 2 each, Rfl: 11   Continuous Glucose Sensor (FREESTYLE LIBRE 14 DAY SENSOR) MISC, 1 Application by Does not apply route every 14 (fourteen) days. Apply upper deltoid every 14 days, use reader to determine blood sugars, Disp: , Rfl:    Glucose Blood (BLOOD GLUCOSE TEST STRIPS) STRP, Use to check blood sugar twice daily. May substitute to any manufacturer covered by patient's insurance. Patient doesn't know name of glucometer, Disp: 100 strip, Rfl: 0   insulin  degludec (TRESIBA  FLEXTOUCH) 100 UNIT/ML FlexTouch Pen, Inject 24 Units into the skin 2 (two) times daily., Disp: 9 mL, Rfl: 1   Insulin  Pen Needle (1ST TIER UNIFINE PENTIPS) 32G X 4 MM MISC, Use as directed with Tresiba  Flextouch pen, Disp: 100 each, Rfl: 12   Lancets Misc. MISC, Use to check blood sugar twice daily. May substitute to any manufacturer covered by patient's insurance. Patient does not know name of device., Disp: 100  each, Rfl: 0   metFORMIN  (GLUCOPHAGE ) 500 MG tablet, Take 2 tablets (1,000 mg total) by mouth daily with breakfast AND 2 tablets (1,000 mg total) every evening., Disp: 270 tablet, Rfl: 3   Multiple Vitamin (MULTIVITAMIN WITH MINERALS) TABS tablet, Take 1 tablet by mouth daily in the afternoon., Disp: , Rfl:    Medications ordered in this encounter:  Meds ordered this encounter  Medications   nitrofurantoin , macrocrystal-monohydrate, (MACROBID ) 100 MG capsule    Sig: Take 1 capsule (100 mg total) by mouth 2 (two) times daily.    Dispense:  10 capsule    Refill:  0    Supervising Provider:   BLAISE ALEENE KIDD [[id number]]     *If you need refills on other medications prior to your next appointment, please contact your pharmacy*  Follow-Up: Call back or seek an in-person evaluation if the symptoms worsen or if the condition fails to improve as anticipated.  [facility] [phone]  Other Instructions Infeccin de las vas urinarias en las mujeres Urinary Tract Infection, Female La infeccin de las vas urinarias (IVU) se presenta en las vas urinarias. Las vas urinarias estn formadas por rganos que producen, Barrister's clerk y eliminan la orina en el cuerpo. Estos rganos incluyen los siguientes: Los riones. Los urteres. La vejiga. La uretra. Cules son las causas? La mayora de las IVU son causadas por grmenes llamados bacterias. Pueden estar dentro o cerca de los genitales. Estos grmenes proliferan y causan hinchazn en las vas urinarias. Qu incrementa el riesgo? Una persona es ms propensa a tener una IVU si: Es mujer. La uretra es  ms corta en las mujeres que en los hombres. Tiene colocado un tubo blando llamado catter que drena la orina. No puede controlar cuando orina o defeca. Tiene dificultad para orinar debido a: Un clculo renal. Una obstruccin urinaria. Un trastorno nervioso que afecta la vejiga. No bebe una cantidad suficiente de lquido. Es  sexualmente activa. Usa  un mtodo anticonceptivo que se coloca dentro de la vagina, como un espermicida. Est embarazada. Tiene niveles bajos de la hormona estrgeno en el cuerpo. Es Psychologist, sport and exercise. Tambin es ms probable que contraiga una IVU si tiene otros problemas de [place]. Pueden incluir: Diabetes. El sistema inmunitario debilitado. Su sistema inmunitario es [address] de defensa de su cuerpo. Anemia drepanoctica. Lesin en la columna vertebral. Cules son los signos o sntomas? Entre los sntomas, se pueden incluir los siguientes: Necesidad inmediata de Geographical information systems officer. Hacer poca cantidad de orina con mucha frecuencia. Dolor o ardor al Geographical information systems officer. Sangre en la orina. Orina con mal olor u [employer]. Dolor en la parte inferior de la espalda o en el vientre. Tambin puede: Sentirse confundido. Este puede ser Financial risk analyst sntoma en los adultos [place]. Vomitar. No tener apetito. Cansarse o irritarse con facilidad. Tener fiebre o escalofros. Cmo se diagnostica? Las IVU se diagnostican en funcin de los antecedentes mdicos y de Nurse, learning disability. Tambin pueden hacerle otros estudios. Pueden incluir: Anlisis de comoros. Anlisis de [place]. Pruebas de infecciones de transmisin sexual (ITS). Si ha tenido ms de una IVU, es posible que deba hacerse estudios de diagnstico por imgenes para Financial risk analyst por qu sigue contrayndolas. Cmo se trata? Una IVU puede tratarse de las siguientes maneras: Tomar antibiticos u otros medicamentos. Beber suficiente lquido como para Pharmacologist la orina de color amarillo plido. En casos poco frecuentes, una IVU puede causar una afeccin muy grave llamada sepsis. Es posible que la sepsis deba recibir Pharmacist, hospital hospital. Siga estas instrucciones en su casa: Medicamentos Tome los medicamentos solamente como se lo haya indicado el mdico. Si le dieron antibiticos, tmelos o selos como se lo haya indicado el mdico. No deje de tomarlos aunque comience a  sentirse mejor. Instrucciones generales Asegrese de hacer lo siguiente: Orine con frecuencia y vace la vejiga por completo. No contenga la [employer]. Lmpiese de adelante hacia atrs despus de Automotive engineer. Use cada trozo de papel higinico una sola vez cuando se limpie. Orine despus de [employer]. No se haga duchas vaginales ni use aerosoles o talcos en la zona genital. Comunquese con un mdico si: Sus sntomas no han mejorado despus de 1 o 2 das de tratamiento con antibiticos. Los sntomas desaparecen y luego reaparecen. Tiene fiebre o escalofros. Vomita o tiene ganas de vomitar. Solicite ayuda de inmediato si: Tiene dolor muy intenso en la espalda o la parte baja del vientre. Se desmaya. Esta informacin no tiene Theme park manager el consejo del mdico. Asegrese de hacerle al mdico cualquier pregunta que tenga. Document Revised: 01/29/2023 Document Reviewed: 01/29/2023 Elsevier Patient Education  [employer].   If you have been instructed to have an in-person evaluation today at a local Urgent Care facility, please use the link below. It will take you to a list of all of our available [facility], including address, phone number and hours of operation. Please do not delay care.  [facility]  If you or a family member do not have a primary care provider, use the link below to schedule a visit and establish care. When you  choose a [facility] primary care physician or advanced practice provider, you gain a long-term partner in health. Find a Primary Care Provider  Learn more about [facility] in-office and virtual care options: [facility] - Get Care Now

## 2024-03-09 NOTE — Progress Notes (Signed)
 Virtual Visit Consent   Alexandra Norris, you are scheduled for a virtual visit with a [facility] provider today. Just as with appointments in the office, your consent must be obtained to participate. Your consent will be active for this visit and any virtual visit you may have with one of our providers in the next 365 days. If you have a MyChart account, a copy of this consent can be sent to you electronically.  As this is a virtual visit, video technology does not allow for your provider to perform a traditional examination. This may limit your provider's ability to fully assess your condition. If your provider identifies any concerns that need to be evaluated in person or the need to arrange testing (such as labs, EKG, etc.), we will make arrangements to do so. Although advances in technology are sophisticated, we cannot ensure that it will always work on either your end or our end. If the connection with a video visit is poor, the visit may have to be switched to a telephone visit. With either a video or telephone visit, we are not always able to ensure that we have a secure connection.  By engaging in this virtual visit, you consent to the provision of healthcare and authorize for your insurance to be billed (if applicable) for the services provided during this visit. Depending on your insurance coverage, you may receive a charge related to this service.  I need to obtain your verbal consent now. Are you willing to proceed with your visit today? Alexandra Norris has provided verbal consent on 03/09/2024 for a virtual visit (video or telephone). Delon CHRISTELLA Dickinson, PA-C  Date: 03/09/2024 12:52 PM   Virtual Visit via Video Note   I, Delon CHRISTELLA Dickinson, connected with  Alexandra Norris  ([id number], 19-May-1976) on 03/09/24 at 12:45 PM EDT by a video-enabled telemedicine application and verified that I am speaking with the correct person using two  identifiers.  Interpreter services were offered and patient declined.  Location: Patient: Virtual Visit Location Patient: Home Provider: Virtual Visit Location Provider: Home Office   I discussed the limitations of evaluation and management by telemedicine and the availability of in person appointments. The patient expressed understanding and agreed to proceed.    History of Present Illness: Alexandra Norris is a 48 y.o. who identifies as a female who was assigned female at birth, and is being seen today for dysuria.  HPI: Urinary Tract Infection  This is a new problem. The current episode started in the past 7 days (5 days). The problem occurs every urination. The problem has been unchanged. The quality of the pain is described as burning. The pain is mild. There has been no fever. Associated symptoms include frequency, hesitancy and urgency. Pertinent negatives include no chills, discharge, flank pain, hematuria, nausea or sweats. Associated symptoms comments: Suprapubic pressure, low back pain. She has tried increased fluids (AZO) for the symptoms. The treatment provided no relief. Her past medical history is significant for recurrent UTIs.     Problems:  Patient Active Problem List   Diagnosis Date Noted   Cubital tunnel syndrome on left 02/22/2024   Chronic right shoulder pain 02/22/2024   Family history of colon cancer 05/25/2023   Benign neoplasm of colon 05/25/2023   Acute intractable headache 03/31/2023   Type 2 diabetes mellitus with hyperglycemia, with long-term current use of insulin  (HCC) 07/28/2022   Asthma due to seasonal allergies 09/25/2021   Arthritis of right acromioclavicular joint 04/24/2021  Chronic left shoulder pain 04/22/2021   Hypertension associated with diabetes (HCC) 04/22/2021   Hyperlipidemia associated with type 2 diabetes mellitus (HCC) 04/22/2021   Fatty liver disease, nonalcoholic 09/09/2020   Vitamin D  deficiency 09/25/2015   Anxiety  and depression 07/06/2014   GASTROESOPHAGEAL REFLUX DISEASE 08/14/2009    Allergies: No Known Allergies Medications:  Current Outpatient Medications:    nitrofurantoin , macrocrystal-monohydrate, (MACROBID ) 100 MG capsule, Take 1 capsule (100 mg total) by mouth 2 (two) times daily., Disp: 10 capsule, Rfl: 0   Continuous Glucose Sensor (FREESTYLE LIBRE 14 DAY SENSOR) MISC, Apply upper deltoid every 14 days, use reader to determine blood sugars., Disp: 2 each, Rfl: 11   Continuous Glucose Sensor (FREESTYLE LIBRE 14 DAY SENSOR) MISC, 1 Application by Does not apply route every 14 (fourteen) days. Apply upper deltoid every 14 days, use reader to determine blood sugars, Disp: , Rfl:    Glucose Blood (BLOOD GLUCOSE TEST STRIPS) STRP, Use to check blood sugar twice daily. May substitute to any manufacturer covered by patient's insurance. Patient doesn't know name of glucometer, Disp: 100 strip, Rfl: 0   insulin  degludec (TRESIBA  FLEXTOUCH) 100 UNIT/ML FlexTouch Pen, Inject 24 Units into the skin 2 (two) times daily., Disp: 9 mL, Rfl: 1   Insulin  Pen Needle (1ST TIER UNIFINE PENTIPS) 32G X 4 MM MISC, Use as directed with Tresiba  Flextouch pen, Disp: 100 each, Rfl: 12   Lancets Misc. MISC, Use to check blood sugar twice daily. May substitute to any manufacturer covered by patient's insurance. Patient does not know name of device., Disp: 100 each, Rfl: 0   metFORMIN  (GLUCOPHAGE ) 500 MG tablet, Take 2 tablets (1,000 mg total) by mouth daily with breakfast AND 2 tablets (1,000 mg total) every evening., Disp: 270 tablet, Rfl: 3   Multiple Vitamin (MULTIVITAMIN WITH MINERALS) TABS tablet, Take 1 tablet by mouth daily in the afternoon., Disp: , Rfl:   Observations/Objective: Patient is well-developed, well-nourished in no acute distress.  Resting comfortably at home.  Head is normocephalic, atraumatic.  No labored breathing.  Speech is clear and coherent with logical content.  Patient is alert and oriented at  baseline.    Assessment and Plan: 1. Suspected UTI (Primary) - nitrofurantoin , macrocrystal-monohydrate, (MACROBID ) 100 MG capsule; Take 1 capsule (100 mg total) by mouth 2 (two) times daily.  Dispense: 10 capsule; Refill: 0  - Worsening symptoms.  - Will treat empirically with Macrobid  - May use AZO for bladder spasms - Continue to push fluids.  - Seek in person evaluation for urine culture if symptoms do not improve or if they worsen.    Follow Up Instructions: I discussed the assessment and treatment plan with the patient. The patient was provided an opportunity to ask questions and all were answered. The patient agreed with the plan and demonstrated an understanding of the instructions.  A copy of instructions were sent to the patient via MyChart unless otherwise noted below.    The patient was advised to call back or seek an in-person evaluation if the symptoms worsen or if the condition fails to improve as anticipated.    Delon CHRISTELLA Dickinson, PA-C

## 2024-03-14 ENCOUNTER — Other Ambulatory Visit: Payer: Self-pay

## 2024-03-14 NOTE — Progress Notes (Signed)
 03/14/2024 Name: Alexandra Norris MRN: [id number] DOB: 05/15/1976  No chief complaint on file.   Alexandra Norris is a 48 y.o. year old female who presented for a telephone visit.   They were referred to the pharmacist by their PCP for assistance in managing diabetes and medication access.    Subjective:  Care Team: Primary Care Provider: Knute Thersia Bitters, FNP ; Next Scheduled Visit:  Future Appointments  Date Time Provider Department Center  04/03/2024  9:10 AM Knute Thersia Bitters, FNP [facility] [id number]  04/05/2024  9:15 AM Georgina Ozell LABOR, MD [facility] None  04/11/2024  2:45 PM Pandora Cadet, RPH [facility] None  04/12/2024  1:45 PM Georgina Ozell LABOR, MD [facility] None    Medication Access/Adherence  Current Pharmacy:  [facility] [address] [place] [address] Phone: [phone] Fax: [phone]  [facility] - [place], [place] - [address] [place] [address] Phone: [phone] Fax: [phone]   Patient reports affordability concerns with their medications: historically noted Patient reports access/transportation concerns to their pharmacy: No  Patient reports adherence concerns with their medications:  not typically    Diabetes:  Current medications: metformin  1000mg  BID, tresiba  22 units qam 22 units qpm, Ozempic  0.25mg  once weekly   Current glucose readings: reports 120s-130s, might have reading near 70s once or twice a week  Current physical activity: walking 4x/wk   Current medication access support: novonordisk-uninsured. [facility] d/t FPL 18%   Macrovascular and Microvascular Risk Reduction:  Statin? Had been on atorvastatin , unclear why stopped.  ; ACEi/ARB? no; therapy not indicated  Last urinary albumin/creatinine ratio:  Lab Results  Component Value Date   MICRALBCREAT <30 02/01/2024    MICRALBCREAT <30 04/22/2021   MICRALBCREAT 2 08/07/2020   Last eye exam:  Lab Results  Component Value Date   HMDIABEYEEXA No Retinopathy 03/09/2023   Last foot exam: 02/01/2024 Tobacco Use:  Tobacco Use: Low Risk  (03/09/2024)   Patient History    Smoking Tobacco Use: Never    Smokeless Tobacco Use: Never    Passive Exposure: Not on file     Objective:  Lab Results  Component Value Date   HGBA1C 13.1 (H) 11/10/2023    Lab Results  Component Value Date   CREATININE 0.49 01/14/2024   BUN 11 01/14/2024   NA 134 (L) 01/14/2024   K 4.3 01/14/2024   CL 99 01/14/2024   CO2 21 (L) 01/14/2024    Lab Results  Component Value Date   CHOL 208 (H) 11/10/2023   HDL 51 11/10/2023   LDLCALC 139 (H) 11/10/2023   TRIG 100 11/10/2023   CHOLHDL 4.1 11/10/2023    Assessment/Plan:   Diabetes: - Currently uncontrolled; goal A1c <7%. Cardiorenal risk reduction is opportunities for improvement.. Blood pressure is at goal <130/80. LDL is not at goal.  - Reviewed lifestyle modifications including walking multiple times per week., Reviewed signs and symptoms of hypoglycemia., and recommend daily BG testing - reviewed ozempic  dosing - states has taken 4 doses of the ozempic  0.25mg /week so next week should be 0.5mg , however may be waiting until seeing PCP next week.  - counseled in detail on management of hypoglycemia - familiar with correction, has candies, cookies, soda on hand to correct if needed.   Recommend atorvastatin  restart - should be low cost through [place] discount drug list. Recommend ozempic  titration to 0.5mg  if not already being done along with basal insulin   reduction given FBGs typically 120-130s. PharmD to f/u in 4 weeks.    Follow Up Plan: will check back in with patient in about 1 month to ensure needs are met. Despite changes to ozempic  patient assistance for patients with medicare part d, uninsured patients should be eligible for re-enrollment as long as FPL  <200% ($53,300 annual/3 person household)     Lang Sieve, PharmD, BCGP Clinical Pharmacist  [phone]

## 2024-04-03 ENCOUNTER — Encounter (HOSPITAL_BASED_OUTPATIENT_CLINIC_OR_DEPARTMENT_OTHER): Payer: Self-pay | Admitting: Family Medicine

## 2024-04-03 ENCOUNTER — Ambulatory Visit (INDEPENDENT_AMBULATORY_CARE_PROVIDER_SITE_OTHER): Payer: Self-pay | Admitting: Family Medicine

## 2024-04-03 VITALS — BP 119/66 | HR 85 | Ht 61.0 in | Wt 237.5 lb

## 2024-04-03 DIAGNOSIS — G43019 Migraine without aura, intractable, without status migrainosus: Secondary | ICD-10-CM

## 2024-04-03 DIAGNOSIS — E1165 Type 2 diabetes mellitus with hyperglycemia: Secondary | ICD-10-CM

## 2024-04-03 DIAGNOSIS — Z23 Encounter for immunization: Secondary | ICD-10-CM

## 2024-04-03 DIAGNOSIS — E782 Mixed hyperlipidemia: Secondary | ICD-10-CM

## 2024-04-03 DIAGNOSIS — Z794 Long term (current) use of insulin: Secondary | ICD-10-CM

## 2024-04-03 MED ORDER — SUMATRIPTAN SUCCINATE 50 MG PO TABS: ORAL_TABLET | ORAL | 2 refills | Status: AC

## 2024-04-03 MED ORDER — ALBUTEROL SULFATE HFA 108 (90 BASE) MCG/ACT IN AERS: 2.0000 | INHALATION_SPRAY | Freq: Four times a day (QID) | RESPIRATORY_TRACT | 2 refills | Status: DC | PRN

## 2024-04-03 MED ORDER — TRESIBA FLEXTOUCH 100 UNIT/ML ~~LOC~~ SOPN: 22.0000 [IU] | PEN_INJECTOR | Freq: Every day | SUBCUTANEOUS | Status: DC

## 2024-04-03 MED ORDER — METFORMIN HCL 500 MG PO TABS: 500.0000 mg | ORAL_TABLET | Freq: Two times a day (BID) | ORAL | 3 refills | Status: AC

## 2024-04-03 MED ORDER — OZEMPIC (0.25 OR 0.5 MG/DOSE) 2 MG/3ML ~~LOC~~ SOPN: 0.5000 mg | PEN_INJECTOR | SUBCUTANEOUS | 3 refills | Status: AC

## 2024-04-03 NOTE — Patient Instructions (Addendum)
 Decrease your metformin  to taking 1 tablet in the morning and 1 tablet at night.   Increase ozempic  to 0.5 mg injected once weekly.   Ophthalmology Offices   [facility]  [address]  [place], [place] [address] [phone]  [facility] Ophthalmology [address] [place], [place] [address] Phone: [phone]  [facility] [address] [place], [place] [address] Phone: [phone]  [facility]  Optometrist [address]  [phone]  [facility] Optometrist [address]  [phone]

## 2024-04-03 NOTE — Progress Notes (Signed)
 Subjective:   Alexandra Norris Nov 01, 1975 04/03/2024  Chief Complaint  Patient presents with   Medical Management of Chronic Issues    51-month follow up; denies any main concerns for today's visit. Is requesting to have inhaler sent to pharmacy.   Due to language barrier, a medical interpreter was present during the HPI, ROS, and discussion for the plan of care.  Interpreter: Jairo, Spanish Interpreter   Discussed the use of AI scribe software for clinical note transcription with the patient, who gave verbal consent to proceed.  History of Present Illness My Rinke is a 48 year old female with diabetes who presents for follow-up.  She is managing her diabetes with Ozempic  0.25 mg once a week, metformin  two tablets (1000mg ) with breakfast and dinner, and 22 units of Tresiba  in the afternoon. Her blood sugar levels have been within the target range of 70 to 180 mg/dL 03% of the time, with an average level of 130 mg/dL. No episodes of hypoglycemia have occurred in the past two weeks. She uses the [employer] sensor to scan her glucose levels approximately 12 times a day.  She has discontinued glipizide  and was contacted by the pharmacy about restarting atorvastatin  for cholesterol management two weeks ago, but she is not currently taking it. She needs refills for Ozempic , metformin , and an albuterol  inhaler.  She has not had an eye exam this year and has not received a flu shot yet. She inquires about the COVID vaccine and wants to get a flu shot during this visit.  Patient's current CGM freestyle libre shows glucose management indicator of 6.4%.  Average glucose 131.  Glucose variability 20.3%.  Target range of 70 to 180 mg is 96%.  High range of 181-250 is 4%.  See glucose report scanned into patient's chart.  Lab Results  Component Value Date   HGBA1C 13.1 (H) 11/10/2023    Foot Exam: 02/01/2024 Lab Results  Component Value Date   LABMICR  6.2 08/07/2020   MICROALBUR 30 02/01/2024   MICROALBUR 10 04/22/2021    Wt Readings from Last 3 Encounters:  04/03/24 237 lb 8 oz (107.7 kg)  02/01/24 239 lb (108.4 kg)  11/10/23 230 lb (104.3 kg)   Lab Results  Component Value Date   CHOL 208 (H) 11/10/2023   HDL 51 11/10/2023   LDLCALC 139 (H) 11/10/2023   TRIG 100 11/10/2023   CHOLHDL 4.1 11/10/2023      The following portions of the patient's history were reviewed and updated as appropriate: past medical history, past surgical history, family history, social history, allergies, medications, and problem list.   Patient Active Problem List   Diagnosis Date Noted   Cubital tunnel syndrome on left 02/22/2024   Chronic right shoulder pain 02/22/2024   Family history of colon cancer 05/25/2023   Benign neoplasm of colon 05/25/2023   Acute intractable headache 03/31/2023   Type 2 diabetes mellitus with hyperglycemia, with long-term current use of insulin  (HCC) 07/28/2022   Asthma due to seasonal allergies 09/25/2021   Arthritis of right acromioclavicular joint 04/24/2021   Chronic left shoulder pain 04/22/2021   Hypertension associated with diabetes (HCC) 04/22/2021   Hyperlipidemia associated with type 2 diabetes mellitus (HCC) 04/22/2021   Fatty liver disease, nonalcoholic 09/09/2020   Vitamin D  deficiency 09/25/2015   Anxiety and depression 07/06/2014   GASTROESOPHAGEAL REFLUX DISEASE 08/14/2009   Past Medical History:  Diagnosis Date   Anemia    Anemia    Anxiety  Arthritis    Asthma    Bilateral chronic knee pain 09/25/2015   Carpal tunnel syndrome    Cervical disc disorder at C5-C6 level with radiculopathy 04/03/2020   Cervical post-laminectomy syndrome 03/06/2022   Cubital tunnel syndrome on right 12/02/2021   Depression    DEPRESSION, SITUATIONAL, PROLONGED 02/05/2009   Qualifier: Diagnosis of  By: Reynaldo  MD, Khary     Diabetes mellitus without complication (HCC)    Dizziness 04/22/2021   Dyspnea     Elevated blood-pressure reading, without diagnosis of hypertension 06/17/2020   Endometriosis    Fatigue 04/22/2021   History of blood transfusion 1999   w/vaginal delivery   History of bronchitis    I've stayed in hospital 2 X w/bronchitis   Hyperlipidemia    Hypertension    Impingement syndrome of left shoulder    Influenza vaccine needed 04/03/2020   Left carpal tunnel syndrome 07/11/2019   Migraine    Moderate persistent asthma with acute exacerbation 12/06/2006   Qualifier: Diagnosis of  By: Reynaldo  MD, Lucita     Nasal obstruction 02/18/2016   Formatting of this note might be different from the original.  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   Pre-diabetes 10/03/2019   A1C 6.4   Right carpal tunnel syndrome 07/11/2019   S/P arthroscopy of right shoulder 07/08/2021   S/P carpal tunnel release 08/30/2019   Seasonal allergies 10/08/2016   Shingles 11/14/2020   Sinusitis, bacterial 10/07/2021   Snoring 01/20/2022   Past Surgical History:  Procedure Laterality Date   ABDOMINAL HYSTERECTOMY  10/17/2019   APPENDECTOMY     CARPAL TUNNEL RELEASE Right 07/19/2019   Procedure: RIGHT CARPAL TUNNEL RELEASE;  Surgeon: Jerri Kay HERO, MD;  Location: [facility];  Service: Orthopedics;  Laterality: Right;   CARPAL TUNNEL RELEASE Left 09/13/2019   Procedure: LEFT CARPAL TUNNEL RELEASE;  Surgeon: Jerri Kay HERO, MD;  Location: [facility];  Service: Orthopedics;  Laterality: Left;  Bier block   CESAREAN SECTION  1997; 2008   CHOLECYSTECTOMY  2011   COLONOSCOPY WITH PROPOFOL  N/A 05/25/2023   Procedure: COLONOSCOPY WITH PROPOFOL ;  Surgeon: Leigh Elspeth SQUIBB, MD;  Location: [facility];  Service: Gastroenterology;  Laterality: N/A;   POLYPECTOMY  05/25/2023   Procedure: POLYPECTOMY;  Surgeon: Leigh Elspeth SQUIBB, MD;   Location: [facility];  Service: Gastroenterology;;   SHOULDER ARTHROSCOPY WITH DISTAL CLAVICLE RESECTION  02/12/2021   Procedure: SHOULDER ARTHROSCOPY WITH DISTAL CLAVICLE RESECTION;  Surgeon: Jerri Kay HERO, MD;  Location: [facility];  Service: Orthopedics;;   SHOULDER ARTHROSCOPY WITH ROTATOR CUFF REPAIR AND SUBACROMIAL DECOMPRESSION Left 02/12/2021   Procedure: LEFT SHOULDER ARTHROSCOPY WITH ROTATOR CUFF REPAIR, SUBACROMIAL DECOMPRESSION, EXTENSIVE DEBRIDEMENT;  Surgeon: Jerri Kay HERO, MD;  Location: [facility];  Service: Orthopedics;  Laterality: Left;   TONSILLECTOMY     tonsilletomy     TUBAL LIGATION  04/2007   ULNAR TUNNEL RELEASE Right 12/17/2021   Procedure: RIGHT CUBITAL TUNNEL RELEASE;  Surgeon: Jerri Kay HERO, MD;  Location: [facility];  Service: Orthopedics;  Laterality: Right;   VAGINAL HYSTERECTOMY Bilateral 10/17/2019   Procedure: HYSTERECTOMY VAGINAL WITH SALPINGECTOMY;  Surgeon: Lorence Ozell CROME, MD;  Location: [facility];  Service: Gynecology;  Laterality: Bilateral;   Family History  Problem Relation Age of Onset   Diabetes  Mother    Osteoarthritis Mother    Hypertension Mother    Hyperlipidemia Mother    Arthritis Mother    Colon cancer Mother    Colon polyps Neg Hx    Esophageal cancer Neg Hx    Stomach cancer Neg Hx    Ulcerative colitis Neg Hx    Breast cancer Neg Hx    Outpatient Medications Prior to Visit  Medication Sig Dispense Refill   Continuous Glucose Sensor (FREESTYLE LIBRE 14 DAY SENSOR) MISC Apply upper deltoid every 14 days, use reader to determine blood sugars. 2 each 11   Glucose Blood (BLOOD GLUCOSE TEST STRIPS) STRP Use to check blood sugar twice daily. May substitute to any manufacturer covered by patient's insurance. Patient doesn't know name of glucometer 100 strip 0   insulin  degludec (TRESIBA  FLEXTOUCH) 100 UNIT/ML FlexTouch Pen Inject 24 Units into the skin 2 (two) times daily. 9 mL 1   Insulin  Pen Needle  (1ST TIER UNIFINE PENTIPS) 32G X 4 MM MISC Use as directed with Tresiba  Flextouch pen 100 each 12   Lancets Misc. MISC Use to check blood sugar twice daily. May substitute to any manufacturer covered by patient's insurance. Patient does not know name of device. 100 each 0   Multiple Vitamin (MULTIVITAMIN WITH MINERALS) TABS tablet Take 1 tablet by mouth daily in the afternoon.     metFORMIN  (GLUCOPHAGE ) 500 MG tablet Take 2 tablets (1,000 mg total) by mouth daily with breakfast AND 2 tablets (1,000 mg total) every evening. 270 tablet 3   Semaglutide ,0.25 or 0.5MG /DOS, (OZEMPIC , 0.25 OR 0.5 MG/DOSE,) 2 MG/3ML SOPN Inject into the skin.     Continuous Glucose Sensor (FREESTYLE LIBRE 14 DAY SENSOR) MISC 1 Application by Does not apply route every 14 (fourteen) days. Apply upper deltoid every 14 days, use reader to determine blood sugars     nitrofurantoin , macrocrystal-monohydrate, (MACROBID ) 100 MG capsule Take 1 capsule (100 mg total) by mouth 2 (two) times daily. 10 capsule 0   No facility-administered medications prior to visit.   No Known Allergies   ROS: A complete ROS was performed with pertinent positives/negatives noted in the HPI. The remainder of the ROS are negative.    Objective:   Today's Vitals   04/03/24 0857  BP: 119/66  Pulse: 85  SpO2: 98%  Weight: 237 lb 8 oz (107.7 kg)  Height: 5' 1 (1.549 m)    Physical Exam   GENERAL: Well-appearing, in NAD. Well nourished.  SKIN: Pink, warm and dry.  Head: Normocephalic. NECK: Trachea midline. Full ROM w/o pain or tenderness. RESPIRATORY: Chest wall symmetrical. Respirations even and non-labored.  MSK: Muscle tone and strength appropriate for age. NEUROLOGIC: No motor or sensory deficits. Steady, even gait. C2-C12 intact.  PSYCH/MENTAL STATUS: Alert, oriented x 3. Cooperative, appropriate mood and affect.   Health Maintenance Due  Topic Date Due   OPHTHALMOLOGY EXAM  03/08/2024    The 10-year ASCVD risk score (Arnett  DK, et al., 2019) is: 1.9%     Assessment & Plan:  1. Type 2 diabetes mellitus with hyperglycemia, with long-term current use of insulin  (HCC) (Primary) Currently well-controlled.  Will consider possibly discontinuation or decreasing dosage of Trulicity  with A1c collected with lab work today.  Will check lipid panel as well and consider statin therapy if needed.  We will go ahead and reduce patient's metformin  to 1 tablet (500 mg) twice daily.  She will continue with good glucose management with diet and exercise as well. - metFORMIN  (  GLUCOPHAGE ) 500 MG tablet; Take 1 tablet (500 mg total) by mouth 2 (two) times daily with a meal.  Dispense: 180 tablet; Refill: 3 - Lipid panel - Comprehensive metabolic panel with GFR - Hemoglobin A1c  2. Immunization due - Flu vaccine trivalent PF, 6mos and older(Flulaval,Afluria,Fluarix,Fluzone)  3. Intractable migraine without aura and without status migrainosus Patient requested refill of Imitrex .  Safe use reviewed with patient. - SUMAtriptan  (IMITREX ) 50 MG tablet; May repeat dose (1 tablet) in 2 hours if headache persists or recurs. Do not exceed 200mg  in 24 hours.  Dispense: 10 tablet; Refill: 2   4. Moderate mixed hyperlipidemia not requiring statin therapy Previously was on atorvastatin .  Will check lipid panel with lab work today and restart if needed.  Meds ordered this encounter  Medications   metFORMIN  (GLUCOPHAGE ) 500 MG tablet    Sig: Take 1 tablet (500 mg total) by mouth 2 (two) times daily with a meal.    Dispense:  180 tablet    Refill:  3    Supervising Provider:   DE CUBA, RAYMOND J [[id number]]   Semaglutide ,0.25 or 0.5MG /DOS, (OZEMPIC , 0.25 OR 0.5 MG/DOSE,) 2 MG/3ML SOPN    Sig: Inject 0.5 mg into the skin once a week.    Dispense:  6 mL    Refill:  3    Supervising Provider:   DE CUBA, RAYMOND J [[id number]]   albuterol  (VENTOLIN  HFA) 108 (90 Base) MCG/ACT inhaler    Sig: Inhale 2 puffs into the lungs every 6 (six) hours as needed  for wheezing or shortness of breath.    Dispense:  8 g    Refill:  2    Supervising Provider:   DE CUBA, RAYMOND J [[id number]]   SUMAtriptan  (IMITREX ) 50 MG tablet    Sig: May repeat dose (1 tablet) in 2 hours if headache persists or recurs. Do not exceed 200mg  in 24 hours.    Dispense:  10 tablet    Refill:  2    Please translate to spanish.    Supervising Provider:   DE CUBA, RAYMOND J [[id number]]   Lab Orders         Lipid panel         Comprehensive metabolic panel with GFR         Hemoglobin A1c     No images are attached to the encounter or orders placed in the encounter.  Return in about 6 months (around 10/02/2024) for ANNUAL PHYSICAL, DIABETES CHECK UP, hyperlipidemia.    Patient to reach out to office if new, worrisome, or unresolved symptoms arise or if no improvement in patient's condition. Patient verbalized understanding and is agreeable to treatment plan. All questions answered to patient's satisfaction.    Thersia Schuyler Stark, [place]

## 2024-04-04 LAB — COMPREHENSIVE METABOLIC PANEL WITH GFR
ALT: 33 IU/L — ABNORMAL HIGH (ref 0–32)
AST: 34 IU/L (ref 0–40)
Albumin: 4.1 g/dL (ref 3.9–4.9)
Alkaline Phosphatase: 145 IU/L — ABNORMAL HIGH (ref 41–116)
BUN/Creatinine Ratio: 16 (ref 9–23)
BUN: 8 mg/dL (ref 6–24)
Bilirubin Total: 0.7 mg/dL (ref 0.0–1.2)
CO2: 21 mmol/L (ref 20–29)
Calcium: 9.2 mg/dL (ref 8.7–10.2)
Chloride: 102 mmol/L (ref 96–106)
Creatinine, Ser: 0.51 mg/dL — ABNORMAL LOW (ref 0.57–1.00)
Globulin, Total: 2.8 g/dL (ref 1.5–4.5)
Glucose: 114 mg/dL — ABNORMAL HIGH (ref 70–99)
Potassium: 4.5 mmol/L (ref 3.5–5.2)
Sodium: 140 mmol/L (ref 134–144)
Total Protein: 6.9 g/dL (ref 6.0–8.5)
eGFR: 116 mL/min/1.73 (ref >59)

## 2024-04-04 LAB — HEMOGLOBIN A1C
Est. average glucose Bld gHb Est-mCnc: 171 mg/dL
Hgb A1c MFr Bld: 7.6 % — ABNORMAL HIGH (ref 4.8–5.6)

## 2024-04-04 LAB — LIPID PANEL
Chol/HDL Ratio: 3.4 ratio (ref 0.0–4.4)
Cholesterol, Total: 199 mg/dL (ref 100–199)
HDL: 59 mg/dL (ref >39)
LDL Chol Calc (NIH): 116 mg/dL — ABNORMAL HIGH (ref 0–99)
Triglycerides: 137 mg/dL (ref 0–149)
VLDL Cholesterol Cal: 24 mg/dL (ref 5–40)

## 2024-04-05 ENCOUNTER — Ambulatory Visit: Payer: Self-pay | Admitting: Orthopedic Surgery

## 2024-04-06 ENCOUNTER — Ambulatory Visit: Payer: Self-pay

## 2024-04-06 ENCOUNTER — Ambulatory Visit (HOSPITAL_BASED_OUTPATIENT_CLINIC_OR_DEPARTMENT_OTHER): Payer: Self-pay | Admitting: Family Medicine

## 2024-04-06 DIAGNOSIS — Z794 Long term (current) use of insulin: Secondary | ICD-10-CM

## 2024-04-06 MED ORDER — TRESIBA FLEXTOUCH 100 UNIT/ML ~~LOC~~ SOPN
20.0000 [IU] | PEN_INJECTOR | Freq: Every day | SUBCUTANEOUS | Status: AC
Start: 2024-04-06 — End: ?

## 2024-04-06 NOTE — Telephone Encounter (Signed)
 FYI Only or Action Required?: FYI only for provider: ED advised.  Patient was last seen in primary care on 04/03/2024 by Knute Thersia Bitters, FNP.  Called Nurse Triage reporting Blood Sugar Problem.  Symptoms began 12 am 04/06/24  Interventions attempted: Other: eating.  Symptoms are: rapidly worsening.  Triage Disposition: Call EMS 911 Now (overriding Go to ED Now (Notify PCP))  Patient/caregiver understands and will follow disposition?: Yes Reason for Disposition  [1] Low blood glucose (70 mg/dL [6.0 mmol/L] or below) persists > 30 minutes AND [2] using low blood sugar Care Advice  Answer Assessment - Initial Assessment Questions BS has been in the 40s since midnight. States will come back up right after eating and then drop to 40-50 5 minutes later. Pt is on tresiba , metformin  and ozempic . Last tresiba  was this morning, takes BID.  Caller not with pt, initiated 3 way call. Pt reports headache, fatigue and dizziness and a BS now in the 30s. Pt is speaking clearly without obvious slurring. Patient is home with her daughter in law.   With patient's permission, this RN contacted 911 for EMS dispatch. Family unlocked door and pt was advised to eat and then remain sitting or lay down. Daughter in law advised to call 911 back if symptoms worsen prior to EMS arrival. This RN disconnected so that the patient's husband could begin driving home.  1. SYMPTOMS: What symptoms are you concerned about?     C/o headaches. Denies confusion.   2. ONSET:  When did the symptoms start?     Midnight last night  3. BLOOD GLUCOSE: What is your blood glucose level?      40 currently since midnight  4. USUAL RANGE:      130-150  5. TYPE 1 or 2:  Do you know what type of diabetes you have?  (e.g., Type 1, Type 2, Gestational; doesn't know)      Type II  6. INSULIN : Do you take insulin ? What type of insulin (s) do you use? What is the mode of delivery? (syringe, pen; injection or pump)  When did you last give yourself an insulin  dose? (i.e., time or hours/minutes ago) How much did you give? (i.e., how many units)     Tresiba  and ozempic    7. DIABETES PILLS: Do you take any pills for your diabetes? If Yes, ask: What is the name of the medicine(s) that you take for high blood sugar?     Metformin   8. OTHER SYMPTOMS: Do you have any symptoms? (e.g., fever, frequent urination, difficulty breathing, vomiting)     Headache, dizziness, fatigue  9. LOW BLOOD GLUCOSE TREATMENT: What have you done so far to treat the low blood glucose level?     Sweets  11. ALONE: Are you alone right now or is someone with you?        Caller is not with patient  Protocols used: Diabetes - Low Blood Sugar-A-AH Copied from CRM #[id number]. Topic: Clinical - Red Word Triage >> Apr 06, 2024 12:22 PM Nathanel BROCKS wrote: Red Word that prompted transfer to Nurse Triage: Pts sugar levels are dropping, they are down to 40 right now. I have her husband, Aloysius, on the phone.

## 2024-04-06 NOTE — Progress Notes (Signed)
 Please call patient and use interpreter for results:  Her cholesterol has decreased.  If she can continue dietary changes, recommended exercise we can avoid starting the statin therapy.  Statin will help to significantly reduce the LDL cholesterol that is elevated and I am open to starting this if she would be agreeable.  Her A1c and liver enzymes have improved significantly.  Her A1c is at 7.6.  The goal is less than 7.  She should continue with her once daily metformin  tablet as discussed and continue the Ozempic  0.5 mg that was increased at her last visit.  She can continue her Tresiba  and decrease her dosage to 20 units daily.  Continue to use her CGM sensor and if she is having any episodes of low blood sugar to reach out to PCP so we can decrease her insulin  further.  She does need to increase her clear fluids to support healthy kidney function.

## 2024-04-07 ENCOUNTER — Encounter: Payer: Self-pay | Admitting: Orthopedic Surgery

## 2024-04-10 ENCOUNTER — Encounter: Payer: Self-pay | Admitting: Radiology

## 2024-04-11 ENCOUNTER — Other Ambulatory Visit: Payer: Self-pay

## 2024-04-11 ENCOUNTER — Encounter (HOSPITAL_COMMUNITY): Payer: Self-pay

## 2024-04-11 ENCOUNTER — Ambulatory Visit (HOSPITAL_COMMUNITY)
Admission: EM | Admit: 2024-04-11 | Discharge: 2024-04-11 | Disposition: A | Discharge status: Home / Self Care | Payer: PRIVATE HEALTH INSURANCE | Attending: Student | Admitting: Student

## 2024-04-11 ENCOUNTER — Ambulatory Visit: Payer: Self-pay

## 2024-04-11 DIAGNOSIS — J069 Acute upper respiratory infection, unspecified: Secondary | ICD-10-CM

## 2024-04-11 MED ORDER — ALBUTEROL SULFATE HFA 108 (90 BASE) MCG/ACT IN AERS
2.0000 | INHALATION_SPRAY | Freq: Four times a day (QID) | RESPIRATORY_TRACT | 1 refills | Status: AC | PRN
  Filled 2024-04-11: qty 6.7, 25d supply, fill #0

## 2024-04-11 MED ORDER — AZELASTINE HCL 0.1 % NA SOLN
2.0000 | Freq: Two times a day (BID) | NASAL | 2 refills | Status: AC
  Filled 2024-04-11: qty 30, 50d supply, fill #0

## 2024-04-11 NOTE — Discharge Instructions (Addendum)
-  Inhalador de albuterol  segn sea necesario para la tos, sibilancias y dificultad para respirar, 1 a 2 inhalaciones cada 6 horas segn sea necesario. - Utilice el aerosol nasal de azelastina para la congestin nasal, 2 pulverizaciones dos veces al da bed bath & beyond sntomas. -Su tos debera mejorar lentamente en lugar de theme park manager. Si desarrolla tos que produce esputo oscuro o rojo, nueva dificultad para respirar, nueva opresin en el pecho, nueva fiebre, etc., busque atencin adicional.

## 2024-04-11 NOTE — ED Provider Notes (Signed)
 [facility]    CSN: [id number] Arrival date & time: 04/11/24  1406      History   Chief Complaint Chief Complaint  Patient presents with   Cough    HPI Alexandra Norris is a 48 y.o. female presenting with viral syndrome x4 days. History asthma, fatigue, hypertension, migraine, diabetes.   -Notes current illness started with sore throat and nasal congestion. Now with headache and cough.  -Cough is worst at night -H/o asthma, but she is out of the albuterol  inhaler. Denies SOB, CP, chest tightness.  -Ha attempted theraflu, Nyquil, tylenol .  Spoke with patient using language line.   HPI  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 level with radiculopathy 04/03/2020   Cervical post-laminectomy syndrome 03/06/2022   Cubital tunnel syndrome on right 12/02/2021   Depression    DEPRESSION, SITUATIONAL, PROLONGED 02/05/2009   Qualifier: Diagnosis of  By: Reynaldo  MD, Khary     Diabetes mellitus without complication (HCC)    Dizziness 04/22/2021   Dyspnea    Elevated blood-pressure reading, without diagnosis of hypertension 06/17/2020   Endometriosis    Fatigue 04/22/2021   History of blood transfusion 1999   w/vaginal delivery   History of bronchitis    I've stayed in hospital 2 X w/bronchitis   Hyperlipidemia    Hypertension    Impingement syndrome of left shoulder    Influenza vaccine needed 04/03/2020   Left carpal tunnel syndrome 07/11/2019   Migraine    Moderate persistent asthma with acute exacerbation 12/06/2006   Qualifier: Diagnosis of  By: Reynaldo  MD, Lucita     Nasal obstruction 02/18/2016   Formatting of this note might be different from the original.  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   Pre-diabetes 10/03/2019   A1C 6.4   Right carpal tunnel syndrome 07/11/2019   S/P arthroscopy of right shoulder 07/08/2021   S/P carpal tunnel release 08/30/2019   Seasonal allergies 10/08/2016   Shingles 11/14/2020   Sinusitis, bacterial 10/07/2021   Snoring 01/20/2022    Patient Active Problem List   Diagnosis Date Noted   Cubital tunnel syndrome on left 02/22/2024   Chronic right shoulder pain 02/22/2024   Family history of colon cancer 05/25/2023   Benign neoplasm of colon 05/25/2023   Acute intractable headache 03/31/2023   Type 2 diabetes mellitus with hyperglycemia, with long-term current use of insulin  (HCC) 07/28/2022   Asthma due to seasonal allergies 09/25/2021   Arthritis of right acromioclavicular joint 04/24/2021   Chronic left shoulder pain 04/22/2021   Hypertension associated with diabetes (HCC) 04/22/2021   Hyperlipidemia associated with type 2 diabetes mellitus (HCC) 04/22/2021   Fatty liver disease, nonalcoholic 09/09/2020   Vitamin D  deficiency 09/25/2015   Anxiety and depression 07/06/2014   GASTROESOPHAGEAL REFLUX DISEASE 08/14/2009    Past Surgical History:  Procedure Laterality Date   ABDOMINAL HYSTERECTOMY  10/17/2019   APPENDECTOMY     CARPAL TUNNEL RELEASE Right 07/19/2019   Procedure: RIGHT CARPAL TUNNEL RELEASE;  Surgeon: Jerri Kay HERO, MD;  Location: [facility];  Service: Orthopedics;  Laterality: Right;   CARPAL  TUNNEL RELEASE Left 09/13/2019   Procedure: LEFT CARPAL TUNNEL RELEASE;  Surgeon: Jerri Kay HERO, MD;  Location: [facility];  Service: Orthopedics;  Laterality: Left;  Bier block   CESAREAN SECTION  1997; 2008   CHOLECYSTECTOMY  2011   COLONOSCOPY WITH PROPOFOL  N/A 05/25/2023   Procedure: COLONOSCOPY WITH PROPOFOL ;  Surgeon: Leigh Elspeth SQUIBB, MD;  Location: [facility];  Service: Gastroenterology;  Laterality: N/A;   POLYPECTOMY  05/25/2023    Procedure: POLYPECTOMY;  Surgeon: Leigh Elspeth SQUIBB, MD;  Location: [facility];  Service: Gastroenterology;;   SHOULDER ARTHROSCOPY WITH DISTAL CLAVICLE RESECTION  02/12/2021   Procedure: SHOULDER ARTHROSCOPY WITH DISTAL CLAVICLE RESECTION;  Surgeon: Jerri Kay HERO, MD;  Location: [facility];  Service: Orthopedics;;   SHOULDER ARTHROSCOPY WITH ROTATOR CUFF REPAIR AND SUBACROMIAL DECOMPRESSION Left 02/12/2021   Procedure: LEFT SHOULDER ARTHROSCOPY WITH ROTATOR CUFF REPAIR, SUBACROMIAL DECOMPRESSION, EXTENSIVE DEBRIDEMENT;  Surgeon: Jerri Kay HERO, MD;  Location: [facility];  Service: Orthopedics;  Laterality: Left;   TONSILLECTOMY     tonsilletomy     TUBAL LIGATION  04/2007   ULNAR TUNNEL RELEASE Right 12/17/2021   Procedure: RIGHT CUBITAL TUNNEL RELEASE;  Surgeon: Jerri Kay HERO, MD;  Location: [facility];  Service: Orthopedics;  Laterality: Right;   VAGINAL HYSTERECTOMY Bilateral 10/17/2019   Procedure: HYSTERECTOMY VAGINAL WITH SALPINGECTOMY;  Surgeon: Lorence Ozell CROME, MD;  Location: [facility];  Service: Gynecology;  Laterality: Bilateral;    OB History     Gravida  3   Para  3   Term  3   Preterm      AB      Living  3      SAB      IAB      Ectopic      Multiple      Live Births               Home Medications    Prior to Admission medications   Medication Sig Start Date End Date Taking? Authorizing Provider  azelastine (ASTELIN) 0.1 % nasal spray Place 2 sprays into both nostrils 2 (two) times daily. Use in each nostril as directed 04/11/24  Yes Arlyss Leita BRAVO, PA-C  albuterol  (VENTOLIN  HFA) 108 (90 Base) MCG/ACT inhaler Inhale 2 puffs into the lungs every 6 (six) hours as needed for wheezing or shortness of breath. 04/11/24   Trella Thurmond E, PA-C  Continuous Glucose Sensor (FREESTYLE LIBRE 14 DAY SENSOR) MISC Apply upper deltoid every 14 days, use reader to determine blood sugars. 02/01/24   Knute Thersia Bitters, FNP   Glucose Blood (BLOOD GLUCOSE TEST STRIPS) STRP Use to check blood sugar twice daily. May substitute to any manufacturer covered by patient's insurance. Patient doesn't know name of glucometer 11/29/23   Caudle, Thersia Bitters, FNP  insulin  degludec (TRESIBA  FLEXTOUCH) 100 UNIT/ML FlexTouch Pen Inject 20 Units into the skin at bedtime. 04/06/24   Knute Thersia Bitters, FNP  Insulin  Pen Needle (1ST TIER UNIFINE PENTIPS) 32G X 4 MM MISC Use as directed with Tresiba  Flextouch pen 05/26/23   Towana Small, FNP  Lancets Misc. MISC Use to check blood sugar twice daily. May substitute to any manufacturer covered by patient's insurance. Patient does not know name of device. 11/29/23   Caudle, Thersia Bitters, FNP  metFORMIN  (GLUCOPHAGE ) 500 MG tablet Take 1 tablet (500 mg total) by mouth 2 (two) times daily with a meal. 04/03/24   Caudle, Thersia Bitters, FNP  Multiple Vitamin (MULTIVITAMIN WITH MINERALS) TABS tablet Take 1 tablet by mouth daily in the afternoon.    [provider]  Semaglutide ,0.25 or 0.5MG /DOS, (OZEMPIC , 0.25 OR 0.5 MG/DOSE,) 2 MG/3ML SOPN Inject 0.5 mg into the skin once a week. 04/03/24   Knute Thersia Bitters, FNP  SUMAtriptan  (IMITREX ) 50 MG tablet May repeat dose (1 tablet) in 2 hours if headache persists or recurs. Do not exceed 200mg  in 24 hours. 04/03/24   Caudle, Thersia Bitters, FNP  atorvastatin  (LIPITOR) 20 MG tablet Take 1 tablet (20 mg total) by mouth at bedtime. 03/04/23 10/13/23  Towana Small, FNP    Family History Family History  Problem Relation Age of Onset   Diabetes Mother    Osteoarthritis Mother    Hypertension Mother    Hyperlipidemia Mother    Arthritis Mother    Colon cancer Mother    Colon polyps Neg Hx    Esophageal cancer Neg Hx    Stomach cancer Neg Hx    Ulcerative colitis Neg Hx    Breast cancer Neg Hx     Social History Social History   Tobacco Use   Smoking status: Never   Smokeless tobacco: Never  Vaping Use   Vaping status: Never  Used  Substance Use Topics   Alcohol use: No   Drug use: No     Allergies   Patient has no known allergies.   Review of Systems Review of Systems  Constitutional:  Negative for appetite change, chills and fever.  HENT:  Positive for congestion. Negative for ear pain, rhinorrhea, sinus pressure, sinus pain and sore throat.   Eyes:  Negative for redness and visual disturbance.  Respiratory:  Positive for cough. Negative for chest tightness, shortness of breath and wheezing.   Cardiovascular:  Negative for chest pain and palpitations.  Gastrointestinal:  Negative for abdominal pain, constipation, diarrhea, nausea and vomiting.  Genitourinary:  Negative for dysuria, frequency and urgency.  Musculoskeletal:  Negative for myalgias.  Neurological:  Negative for dizziness, weakness and headaches.  Psychiatric/Behavioral:  Negative for confusion.   All other systems reviewed and are negative.    Physical Exam Triage Vital Signs ED Triage Vitals  Encounter Vitals Group     BP 04/11/24 1519 112/75     Girls Systolic BP Percentile --      Girls Diastolic BP Percentile --      Boys Systolic BP Percentile --      Boys Diastolic BP Percentile --      Pulse Rate 04/11/24 1519 78     Resp 04/11/24 1519 18     Temp 04/11/24 1519 98 F (36.7 C)     Temp Source 04/11/24 1519 Oral     SpO2 04/11/24 1519 98 %     Weight --      Height --      Head Circumference --      Peak Flow --      Pain Score 04/11/24 1517 6     Pain Loc --      Pain Education --      Exclude from Growth Chart --    No data found.  Updated Vital Signs BP 112/75 (BP Location: Left Arm)   Pulse 78   Temp 98 F (36.7 C) (Oral)   Resp 18   LMP 09/20/2019 (Exact Date)   SpO2 98%   Visual Acuity Right Eye Distance:   Left Eye Distance:   Bilateral Distance:    Right Eye Near:  Left Eye Near:    Bilateral Near:     Physical Exam Vitals reviewed.  Constitutional:      General: She is not in acute  distress.    Appearance: Normal appearance. She is not ill-appearing.  HENT:     Head: Normocephalic and atraumatic.     Right Ear: Tympanic membrane, ear canal and external ear normal. No tenderness. No middle ear effusion. There is no impacted cerumen. Tympanic membrane is not perforated, erythematous, retracted or bulging.     Left Ear: Tympanic membrane, ear canal and external ear normal. No tenderness.  No middle ear effusion. There is no impacted cerumen. Tympanic membrane is not perforated, erythematous, retracted or bulging.     Nose: Nose normal. No congestion.     Mouth/Throat:     Mouth: Mucous membranes are moist.     Pharynx: Uvula midline. No oropharyngeal exudate or posterior oropharyngeal erythema.     Tonsils: No tonsillar exudate. 1+ on the right. 1+ on the left.     Comments: Tonsils are small and without erythema or exudate.  Eyes:     Extraocular Movements: Extraocular movements intact.     Pupils: Pupils are equal, round, and reactive to light.  Cardiovascular:     Rate and Rhythm: Normal rate and regular rhythm.     Heart sounds: Normal heart sounds.  Pulmonary:     Effort: Pulmonary effort is normal.     Breath sounds: Normal breath sounds. No decreased breath sounds, wheezing, rhonchi or rales.  Abdominal:     Palpations: Abdomen is soft.     Tenderness: There is no abdominal tenderness. There is no guarding or rebound.  Lymphadenopathy:     Cervical: No cervical adenopathy.     Right cervical: No superficial cervical adenopathy.    Left cervical: No superficial cervical adenopathy.  Neurological:     General: No focal deficit present.     Mental Status: She is alert and oriented to person, place, and time.  Psychiatric:        Mood and Affect: Mood normal.        Behavior: Behavior normal.        Thought Content: Thought content normal.        Judgment: Judgment normal.      UC Treatments / Results  Labs (all labs ordered are listed, but only  abnormal results are displayed) Labs Reviewed - No data to display  EKG   Radiology No results found.  Procedures Procedures (including critical care time)  Medications Ordered in UC Medications - No data to display  Initial Impression / Assessment and Plan / UC Course  I have reviewed the triage vital signs and the nursing notes.  Pertinent labs & imaging results that were available during my care of the patient were reviewed by me and considered in my medical decision making (see chart for details).     Patient is a pleasant 48 year old female presenting with viral syndrome.  She is afebrile and nontachycardic.  She has a history of asthma, but there are no adventitious lung sounds on exam.  Symptoms for 4 days, so we did not check a COVID or influenza test.  I will refill her albuterol  inhaler, and I sent a prescription for azelastine nasal spray.  Return precautions as below.  Final Clinical Impressions(s) / UC Diagnoses   Final diagnoses:  Viral URI with cough     Discharge Instructions      -Inhalador de albuterol  segn  sea necesario para la tos, sibilancias y dificultad para respirar, 1 a 2 inhalaciones cada 6 horas segn sea necesario. - Utilice el aerosol nasal de azelastina para la congestin nasal, 2 pulverizaciones dos veces al da bed bath & beyond sntomas. -Su tos debera mejorar lentamente en lugar de theme park manager. Si desarrolla tos que produce esputo oscuro o rojo, nueva dificultad para respirar, nueva opresin en el pecho, nueva fiebre, etc., busque atencin adicional.       ED Prescriptions     Medication Sig Dispense Auth. Provider   albuterol  (VENTOLIN  HFA) 108 (90 Base) MCG/ACT inhaler Inhale 2 puffs into the lungs every 6 (six) hours as needed for wheezing or shortness of breath. 8 g Jemila Camille E, PA-C   azelastine (ASTELIN) 0.1 % nasal spray Place 2 sprays into both nostrils 2 (two) times daily. Use in each nostril as directed 30 mL Arlyss Leita BRAVO, PA-C      PDMP not reviewed this encounter.   Arlyss Leita BRAVO, PA-C 04/11/24 1558

## 2024-04-11 NOTE — ED Triage Notes (Signed)
 Pt c/o cough, sore throat, and chest congestion x4 days. Took tylenol  and Nyquil with no relief.

## 2024-04-11 NOTE — Telephone Encounter (Signed)
 FYI Only or Action Required?: FYI only for provider: UC advised.  Patient was last seen in primary care on 04/03/2024 by Knute Thersia Bitters, FNP.  Called Nurse Triage reporting URI.  Symptoms began several days ago.  Interventions attempted: OTC medications: Theraflu, Muccinex.  Symptoms are: cough, wheezing and chest tightness, mild SOB, sore throat, nasal congestion, runny nose, body aches, chills gradually worsening.  Triage Disposition: See HCP Within 4 Hours (Or PCP Triage)  Patient/caregiver understands and will follow disposition?: Yes            Copied from CRM #[id number]. Topic: Clinical - Red Word Triage >> Apr 11, 2024  8:14 AM Alexandra Norris wrote: Alexandra Norris that prompted transfer to Nurse Triage: Patient has cold/flu symptoms, low grade fever, cough, congestion, runny nose for the past 4 days. Reason for Disposition  [1] MILD difficulty breathing (e.g., minimal/no SOB at rest, SOB with walking, pulse < 100) AND [2] NEW-onset or WORSE than normal  Answer Assessment - Initial Assessment Questions Husband on the phone for triage. Patient is not with him, she has taken her mother to a doctor's appointment.  1. ONSET: When did the nasal discharge start?      4 days ago.  2. AMOUNT: How much discharge is there?      Occasionally blowing nose. Unsure of color of mucous.  3. COUGH: Do you have a cough? If Yes, ask: Describe the color of your mucus. (e.g., clear, white, yellow, green)     Dry cough. He states it sounds pretty bad.  4. RESPIRATORY DISTRESS: Describe your breathing.      Husband reports she is having difficulty breathing, and states this is normal for her during this time of the year. He states she has lost her albuterol  inhaler. He states she has wheezing around this time of year. He states the wheezing is pretty  much all day long.  5. FEVER: Do you have a fever? If Yes, ask: What is your temperature, how was it measured, and when did it  start?     Husband reports low grade fever but states they don't have a thermometer to record it.  6. SEVERITY: Overall, how bad are you feeling right now? (e.g., doesn't interfere with normal activities, staying home from school/work, staying in bed)      Patient is able to perform her normal activities.  7. OTHER SYMPTOMS: Do you have any other symptoms? (e.g., earache, mouth sores, sore throat, wheezing)     Body aches, chills, sore throat, chest tightness with wheezing.  8. PREGNANCY: Is there any chance you are pregnant? When was your last menstrual period?     Hysterectomy.  Denies any known flu or COVID exposure.  Protocols used: Common Cold-A-AH, Breathing Difficulty-A-AH

## 2024-04-12 ENCOUNTER — Ambulatory Visit: Payer: Self-pay | Admitting: Orthopedic Surgery

## 2024-04-13 ENCOUNTER — Other Ambulatory Visit: Payer: Self-pay | Admitting: Physician Assistant

## 2024-04-13 ENCOUNTER — Other Ambulatory Visit (HOSPITAL_BASED_OUTPATIENT_CLINIC_OR_DEPARTMENT_OTHER): Payer: Self-pay

## 2024-04-13 MED ORDER — ONDANSETRON HCL 4 MG PO TABS
4.0000 mg | ORAL_TABLET | Freq: Three times a day (TID) | ORAL | 0 refills | Status: AC | PRN
  Filled 2024-04-13 – 2024-04-26 (×2): qty 40, 14d supply, fill #0

## 2024-04-13 MED ORDER — HYDROCODONE-ACETAMINOPHEN 5-325 MG PO TABS
1.0000 | ORAL_TABLET | Freq: Three times a day (TID) | ORAL | 0 refills | Status: AC | PRN
  Filled 2024-04-13 – 2024-04-26 (×2): qty 21, 7d supply, fill #0

## 2024-04-19 ENCOUNTER — Other Ambulatory Visit: Payer: Self-pay

## 2024-04-19 ENCOUNTER — Encounter (HOSPITAL_BASED_OUTPATIENT_CLINIC_OR_DEPARTMENT_OTHER): Payer: Self-pay | Admitting: Orthopaedic Surgery

## 2024-04-20 ENCOUNTER — Other Ambulatory Visit: Payer: Self-pay

## 2024-04-21 ENCOUNTER — Other Ambulatory Visit: Payer: Self-pay | Admitting: Physician Assistant

## 2024-04-21 ENCOUNTER — Encounter (HOSPITAL_BASED_OUTPATIENT_CLINIC_OR_DEPARTMENT_OTHER)
Admission: RE | Admit: 2024-04-21 | Discharge: 2024-04-21 | Disposition: A | Discharge status: Home / Self Care | Payer: PRIVATE HEALTH INSURANCE | Source: Ambulatory Visit | Attending: Orthopaedic Surgery | Admitting: Orthopaedic Surgery

## 2024-04-21 DIAGNOSIS — Z01818 Encounter for other preprocedural examination: Secondary | ICD-10-CM | POA: Insufficient documentation

## 2024-04-21 LAB — BASIC METABOLIC PANEL WITH GFR
Anion gap: 9 (ref 5–15)
BUN: 7 mg/dL (ref 6–20)
CO2: 23 mmol/L (ref 22–32)
Calcium: 9.1 mg/dL (ref 8.9–10.3)
Chloride: 105 mmol/L (ref 98–111)
Creatinine, Ser: 0.58 mg/dL (ref 0.44–1.00)
GFR, Estimated: >60 mL/min (ref >60)
Glucose, Bld: 185 mg/dL — ABNORMAL HIGH (ref 70–99)
Potassium: 4.1 mmol/L (ref 3.5–5.1)
Sodium: 137 mmol/L (ref 135–145)

## 2024-04-21 NOTE — Progress Notes (Signed)

## 2024-04-24 ENCOUNTER — Telehealth (HOSPITAL_BASED_OUTPATIENT_CLINIC_OR_DEPARTMENT_OTHER): Payer: Self-pay | Admitting: Family Medicine

## 2024-04-24 ENCOUNTER — Other Ambulatory Visit (HOSPITAL_BASED_OUTPATIENT_CLINIC_OR_DEPARTMENT_OTHER): Payer: Self-pay

## 2024-04-24 NOTE — Telephone Encounter (Signed)
 Received pt's next shipment of Ozempic  from [employer] pt assistance program. Called and spoke with pt letting her know this had been received and she verbalized understanding. Pt's medication has been placed in our med fridge and will get for her when she comes.

## 2024-04-26 ENCOUNTER — Ambulatory Visit (HOSPITAL_BASED_OUTPATIENT_CLINIC_OR_DEPARTMENT_OTHER)
Admission: RE | Admit: 2024-04-26 | Discharge: 2024-04-26 | Disposition: A | Discharge status: Home / Self Care | Payer: PRIVATE HEALTH INSURANCE | Attending: Orthopaedic Surgery | Admitting: Orthopaedic Surgery

## 2024-04-26 ENCOUNTER — Other Ambulatory Visit: Payer: Self-pay

## 2024-04-26 ENCOUNTER — Encounter (HOSPITAL_BASED_OUTPATIENT_CLINIC_OR_DEPARTMENT_OTHER): Payer: Self-pay | Admitting: Orthopaedic Surgery

## 2024-04-26 ENCOUNTER — Other Ambulatory Visit (HOSPITAL_BASED_OUTPATIENT_CLINIC_OR_DEPARTMENT_OTHER): Payer: Self-pay

## 2024-04-26 ENCOUNTER — Ambulatory Visit (HOSPITAL_BASED_OUTPATIENT_CLINIC_OR_DEPARTMENT_OTHER): Payer: PRIVATE HEALTH INSURANCE | Admitting: Anesthesiology

## 2024-04-26 ENCOUNTER — Encounter (HOSPITAL_BASED_OUTPATIENT_CLINIC_OR_DEPARTMENT_OTHER)
Admission: RE | Disposition: A | Discharge status: Home / Self Care | Payer: Self-pay | Source: Home / Self Care | Attending: Orthopaedic Surgery

## 2024-04-26 DIAGNOSIS — E119 Type 2 diabetes mellitus without complications: Secondary | ICD-10-CM | POA: Diagnosis not present

## 2024-04-26 DIAGNOSIS — G5622 Lesion of ulnar nerve, left upper limb: Secondary | ICD-10-CM | POA: Insufficient documentation

## 2024-04-26 DIAGNOSIS — Z01818 Encounter for other preprocedural examination: Secondary | ICD-10-CM

## 2024-04-26 DIAGNOSIS — Z7984 Long term (current) use of oral hypoglycemic drugs: Secondary | ICD-10-CM | POA: Diagnosis not present

## 2024-04-26 DIAGNOSIS — I10 Essential (primary) hypertension: Secondary | ICD-10-CM | POA: Diagnosis not present

## 2024-04-26 DIAGNOSIS — Z794 Long term (current) use of insulin: Secondary | ICD-10-CM

## 2024-04-26 DIAGNOSIS — Z79899 Other long term (current) drug therapy: Secondary | ICD-10-CM | POA: Insufficient documentation

## 2024-04-26 DIAGNOSIS — K219 Gastro-esophageal reflux disease without esophagitis: Secondary | ICD-10-CM | POA: Insufficient documentation

## 2024-04-26 HISTORY — PX: ULNAR TUNNEL RELEASE: SHX820

## 2024-04-26 LAB — GLUCOSE, CAPILLARY
Glucose-Capillary: 144 mg/dL — ABNORMAL HIGH (ref 70–99)
Glucose-Capillary: 126 mg/dL — ABNORMAL HIGH (ref 70–99)

## 2024-04-26 SURGERY — RELEASE, CUBITAL TUNNEL
Anesthesia: General | Site: Elbow | Laterality: Left

## 2024-04-26 MED ORDER — FENTANYL CITRATE (PF) 100 MCG/2ML IJ SOLN
INTRAMUSCULAR | Status: DC | PRN
  Administered 2024-04-26 (×2): 25 ug via INTRAVENOUS
  Administered 2024-04-26: 50 ug via INTRAVENOUS

## 2024-04-26 MED ORDER — PROPOFOL 500 MG/50ML IV EMUL
INTRAVENOUS | Status: AC
  Filled 2024-04-26: qty 50

## 2024-04-26 MED ORDER — LIDOCAINE HCL (CARDIAC) PF 100 MG/5ML IV SOSY
PREFILLED_SYRINGE | INTRAVENOUS | Status: DC | PRN
  Administered 2024-04-26: 100 mg via INTRAVENOUS

## 2024-04-26 MED ORDER — DEXAMETHASONE SODIUM PHOSPHATE 4 MG/ML IJ SOLN
INTRAMUSCULAR | Status: DC | PRN
  Administered 2024-04-26: 5 mg via INTRAVENOUS

## 2024-04-26 MED ORDER — 0.9 % SODIUM CHLORIDE (POUR BTL) OPTIME
TOPICAL | Status: DC | PRN
  Administered 2024-04-26: 1000 mL

## 2024-04-26 MED ORDER — CEFAZOLIN SODIUM-DEXTROSE 2-4 GM/100ML-% IV SOLN
INTRAVENOUS | Status: AC
  Filled 2024-04-26: qty 100

## 2024-04-26 MED ORDER — FENTANYL CITRATE (PF) 100 MCG/2ML IJ SOLN
25.0000 ug | INTRAMUSCULAR | Status: DC | PRN
  Administered 2024-04-26: 50 ug via INTRAVENOUS

## 2024-04-26 MED ORDER — PROPOFOL 500 MG/50ML IV EMUL
INTRAVENOUS | Status: DC | PRN
  Administered 2024-04-26: 150 ug/kg/min via INTRAVENOUS

## 2024-04-26 MED ORDER — MIDAZOLAM HCL 2 MG/2ML IJ SOLN
INTRAMUSCULAR | Status: AC
  Filled 2024-04-26: qty 2

## 2024-04-26 MED ORDER — KETOROLAC TROMETHAMINE 30 MG/ML IJ SOLN
INTRAMUSCULAR | Status: DC | PRN
  Administered 2024-04-26: 30 mg via INTRAVENOUS

## 2024-04-26 MED ORDER — BUPIVACAINE HCL (PF) 0.25 % IJ SOLN
INTRAMUSCULAR | Status: DC | PRN
  Administered 2024-04-26: 5 mL

## 2024-04-26 MED ORDER — FENTANYL CITRATE (PF) 100 MCG/2ML IJ SOLN
INTRAMUSCULAR | Status: AC
  Filled 2024-04-26: qty 2

## 2024-04-26 MED ORDER — ACETAMINOPHEN 500 MG PO TABS
1000.0000 mg | ORAL_TABLET | Freq: Once | ORAL | Status: AC
  Administered 2024-04-26: 1000 mg via ORAL

## 2024-04-26 MED ORDER — OXYCODONE HCL 5 MG/5ML PO SOLN: 5.0000 mg | Freq: Once | ORAL | Status: AC | PRN

## 2024-04-26 MED ORDER — CEFAZOLIN SODIUM-DEXTROSE 2-4 GM/100ML-% IV SOLN
2.0000 g | INTRAVENOUS | Status: AC
  Administered 2024-04-26: 2 g via INTRAVENOUS

## 2024-04-26 MED ORDER — LACTATED RINGERS IV SOLN: INTRAVENOUS | Status: DC

## 2024-04-26 MED ORDER — ACETAMINOPHEN 500 MG PO TABS
ORAL_TABLET | ORAL | Status: AC
  Filled 2024-04-26: qty 2

## 2024-04-26 MED ORDER — LIDOCAINE HCL (PF) 1 % IJ SOLN
INTRAMUSCULAR | Status: DC | PRN
  Administered 2024-04-26: 5 mL

## 2024-04-26 MED ORDER — PROPOFOL 10 MG/ML IV BOLUS
INTRAVENOUS | Status: DC | PRN
  Administered 2024-04-26: 10 mg via INTRAVENOUS
  Administered 2024-04-26: 150 mg via INTRAVENOUS

## 2024-04-26 MED ORDER — DROPERIDOL 2.5 MG/ML IJ SOLN: 0.6250 mg | Freq: Once | INTRAMUSCULAR | Status: DC | PRN

## 2024-04-26 MED ORDER — MIDAZOLAM HCL (PF) 2 MG/2ML IJ SOLN
INTRAMUSCULAR | Status: DC | PRN
  Administered 2024-04-26: 2 mg via INTRAVENOUS

## 2024-04-26 MED ORDER — OXYCODONE HCL 5 MG PO TABS
ORAL_TABLET | ORAL | Status: AC
  Filled 2024-04-26: qty 1

## 2024-04-26 MED ORDER — ONDANSETRON HCL 4 MG/2ML IJ SOLN
INTRAMUSCULAR | Status: DC | PRN
Start: 2024-04-26 — End: 2024-04-26
  Administered 2024-04-26: 4 mg via INTRAVENOUS

## 2024-04-26 MED ORDER — OXYCODONE HCL 5 MG PO TABS
5.0000 mg | ORAL_TABLET | Freq: Once | ORAL | Status: AC | PRN
  Administered 2024-04-26: 5 mg via ORAL

## 2024-04-26 SURGICAL SUPPLY — 44 items
BLADE SURG 15 STRL LF DISP TIS (BLADE) ×1 IMPLANT
BNDG COMPR ESMARK 4X3 LF (GAUZE/BANDAGES/DRESSINGS) ×1 IMPLANT
BNDG ELASTIC 3INX 5YD STR LF (GAUZE/BANDAGES/DRESSINGS) ×1 IMPLANT
BNDG GAUZE DERMACEA FLUFF 4 (GAUZE/BANDAGES/DRESSINGS) IMPLANT
BRUSH SCRUB EZ PLAIN DRY (MISCELLANEOUS) ×1 IMPLANT
CORD BIPOLAR FORCEPS 12FT (ELECTRODE) ×1 IMPLANT
COVER BACK TABLE 60X90IN (DRAPES) ×1 IMPLANT
COVER MAYO STAND STRL (DRAPES) ×1 IMPLANT
CUFF TOURN SGL QUICK 18X4 (TOURNIQUET CUFF) IMPLANT
DRAPE EXTREMITY T 121X128X90 (DISPOSABLE) ×1 IMPLANT
DRAPE SURG 17X23 STRL (DRAPES) ×1 IMPLANT
EXTENSION HOSE W/PLC CONNECTON (MISCELLANEOUS) IMPLANT
GAUZE SPONGE 4X4 12PLY STRL (GAUZE/BANDAGES/DRESSINGS) ×1 IMPLANT
GAUZE XEROFORM 1X8 LF (GAUZE/BANDAGES/DRESSINGS) ×1 IMPLANT
GLOVE BIOGEL PI IND STRL 7.0 (GLOVE) IMPLANT
GLOVE BIOGEL PI IND STRL 7.5 (GLOVE) ×1 IMPLANT
GLOVE ECLIPSE 7.0 STRL STRAW (GLOVE) ×1 IMPLANT
GLOVE INDICATOR 7.0 STRL GRN (GLOVE) ×1 IMPLANT
GLOVE SURG SYN 7.5 PF PI (GLOVE) ×1 IMPLANT
GOWN STRL REUS W/ TWL LRG LVL3 (GOWN DISPOSABLE) ×1 IMPLANT
GOWN STRL REUS W/ TWL XL LVL3 (GOWN DISPOSABLE) ×1 IMPLANT
GOWN STRL SURGICAL XL XLNG (GOWN DISPOSABLE) ×1 IMPLANT
LOOP VASCLR MAXI BLUE 18IN ST (MISCELLANEOUS) ×1 IMPLANT
NDL HYPO 25X1 1.5 SAFETY (NEEDLE) ×1 IMPLANT
NEEDLE HYPO 25X1 1.5 SAFETY (NEEDLE) ×1 IMPLANT
PACK BASIN DAY SURGERY FS (CUSTOM PROCEDURE TRAY) ×1 IMPLANT
PAD CAST 3X4 CTTN HI CHSV (CAST SUPPLIES) ×1 IMPLANT
SHEET MEDIUM DRAPE 40X70 STRL (DRAPES) ×1 IMPLANT
SLEEVE SCD COMPRESS KNEE MED (STOCKING) ×1 IMPLANT
SOL PREP POV-IOD 4OZ 10% (MISCELLANEOUS) IMPLANT
SOLN 0.9% NACL POUR BTL 1000ML (IV SOLUTION) ×1 IMPLANT
SOLUTION SCRB POV-IOD 4OZ 7.5% (MISCELLANEOUS) IMPLANT
SPIKE FLUID TRANSFER (MISCELLANEOUS) IMPLANT
SPLINT FIBERGLASS 3X35 (CAST SUPPLIES) IMPLANT
STOCKINETTE 4X48 STRL (DRAPES) ×1 IMPLANT
SUCTION TUBE FRAZIER 10FR DISP (SUCTIONS) ×1 IMPLANT
SUT ETHILON 3 0 PS 1 (SUTURE) IMPLANT
SUT ETHILON 4 0 PS 2 18 (SUTURE) ×1 IMPLANT
SUT VIC AB 2-0 CT1 TAPERPNT 27 (SUTURE) IMPLANT
SYR BULB EAR ULCER 3OZ GRN STR (SYRINGE) ×1 IMPLANT
SYR CONTROL 10ML LL (SYRINGE) ×1 IMPLANT
TOWEL GREEN STERILE FF (TOWEL DISPOSABLE) ×2 IMPLANT
TRAY DSU PREP LF (CUSTOM PROCEDURE TRAY) ×1 IMPLANT
UNDERPAD 30X36 HEAVY ABSORB (UNDERPADS AND DIAPERS) ×1 IMPLANT

## 2024-04-26 NOTE — Discharge Instructions (Addendum)
 Postoperative instructions:  Weightbearing instructions: weight bearing as tolerated  Dressing instructions: Keep your dressing and/or splint clean and dry at all times.  It will be removed at your first post-operative appointment.  Your stitches and/or staples will be removed at this visit.  Incision instructions:  Do not soak your incision for 3 weeks after surgery.  If the incision gets wet, pat dry and do not scrub the incision.  Pain control:  You have been given a prescription to be taken as directed for post-operative pain control.  In addition, elevate the operative extremity above the heart at all times to prevent swelling and throbbing pain.  Take over-the-counter Colace, 100mg  by mouth twice a day while taking narcotic pain medications to help prevent constipation.  Follow up appointments: 1) 7 days for wound check. 2) Dr. Jerri as scheduled.   -------------------------------------------------------------------------------------------------------------  After Surgery Pain Control:  After your surgery, post-surgical discomfort or pain is likely. This discomfort can last several days to a few weeks. At certain times of the day your discomfort may be more intense.  Did you receive a nerve block?  A nerve block can provide pain relief for one hour to two days after your surgery. As long as the nerve block is working, you will experience little or no sensation in the area the surgeon operated on.  As the nerve block wears off, you will begin to experience pain or discomfort. It is very important that you begin taking your prescribed pain medication before the nerve block fully wears off. Treating your pain at the first sign of the block wearing off will ensure your pain is better controlled and more tolerable when full-sensation returns. Do not wait until the pain is intolerable, as the medicine will be less effective. It is better to treat pain in advance than to try and catch up.   General Anesthesia:  If you did not receive a nerve block during your surgery, you will need to start taking your pain medication shortly after your surgery and should continue to do so as prescribed by your surgeon.  Pain Medication:  Most commonly we prescribe Vicodin and Percocet for post-operative pain. Both of these medications contain a combination of acetaminophen  (Tylenol ) and a narcotic to help control pain.   It takes between 30 and 45 minutes before pain medication starts to work. It is important to take your medication before your pain level gets too intense.   Nausea is a common side effect of many pain medications. You will want to eat something before taking your pain medicine to help prevent nausea.   If you are taking a prescription pain medication that contains acetaminophen , we recommend that you do not take additional over the counter acetaminophen  (Tylenol ).  Other pain relieving options:   Using a cold pack to ice the affected area a few times a day (15 to 20 minutes at a time) can help to relieve pain, reduce swelling and bruising.   Elevation of the affected area can also help to reduce pain and swelling.  Per [facility] clinic policy, our goal is ensure optimal postoperative pain control with a multimodal pain management strategy. For all [facility] patients, our goal is to wean post-operative narcotic medications by 6 weeks post-operatively. If this is not possible due to utilization of pain medication prior to surgery, your [facility] doctor will support your acute post-operative pain control for the first 6 weeks postoperatively, with a plan to transition you back to your primary pain  team following that. Maralee will work to ensure a therapist, occupational.   No Tylenol  before 5:15pm. No ibuprofen  before 9:15pm   Post Anesthesia Home Care Instructions  Activity: Get plenty of rest for the remainder of the day. A responsible individual must stay with you for 24 hours  following the procedure.  For the next 24 hours, DO NOT: -Drive a car -Advertising copywriter -Drink alcoholic beverages -Take any medication unless instructed by your physician -Make any legal decisions or sign important papers.  Meals: Start with liquid foods such as gelatin or soup. Progress to regular foods as tolerated. Avoid greasy, spicy, heavy foods. If nausea and/or vomiting occur, drink only clear liquids until the nausea and/or vomiting subsides. Call your physician if vomiting continues.  Special Instructions/Symptoms: Your throat may feel dry or sore from the anesthesia or the breathing tube placed in your throat during surgery. If this causes discomfort, gargle with warm salt water. The discomfort should disappear within 24 hours.

## 2024-04-26 NOTE — Anesthesia Postprocedure Evaluation (Signed)
 Anesthesia Post Note  Patient: Alexandra Norris  Procedure(s) Performed: RELEASE, CUBITAL TUNNEL (Left: Elbow)     Patient location during evaluation: PACU Anesthesia Type: General Level of consciousness: awake and alert Pain management: pain level controlled Vital Signs Assessment: post-procedure vital signs reviewed and stable Respiratory status: spontaneous breathing, nonlabored ventilation, respiratory function stable and patient connected to nasal cannula oxygen Cardiovascular status: blood pressure returned to baseline and stable Postop Assessment: no apparent nausea or vomiting Anesthetic complications: no   No notable events documented.  Last Vitals:  Vitals:   04/26/24 1400 04/26/24 1415  BP: 114/64 137/76  Pulse: 70 60  Resp: 18 15  Temp:    SpO2: 97% 98%    Last Pain:  Vitals:   04/26/24 1412  TempSrc:   PainSc: 8                  [name]

## 2024-04-26 NOTE — Op Note (Signed)
   DATE OF SURGERY: 04/26/2024  PREOPERATIVE DIAGNOSIS: Left cubital tunnel syndrome  POSTOPERATIVE DIAGNOSIS: same.  PROCEDURE: Left  ulnar nerve neurolysis at elbow.  CPT 35281  SURGEON: Kay Cummins, M.D.  ASSIST: Ronal Jacobsen [place], [place]; necessary for the timely completion of procedure and due to complexity of procedure.  ANESTHESIA: General and local  TOURNIQUET TIME: 15 minutes  BLOOD LOSS: Minimal.  COMPLICATIONS: None.  PATHOLOGY: None.  TIME OUT: Performed prior to start of procedure.  INDICATIONS: The patient was a 48 y.o. female who presented with left cubital tunnel syndrome failing nonsurgical managements, indicated for surgery.  DESCRIPTION OF PROCEDURE: The patient was identified in the preoperative holding area.  The operative site was marked by the surgeon and confirmed by the patient.  He was brought back to the operating room.  Anesthesia was induced by the anesthesia team.  The operative extremity was prepped and draped in standard sterile fashion.  A well padded sterile tourniquet was placed on the upper arm A medial elbow incision was made in between the medial epicondyle and olecranon. The medial antebrachial cutaneous nerve was exposed and retracted and protected. The ulnar nerve was identified in between the medial intermuscular septum and medial head of triceps. The fascia crossing the medial head of the triceps and intermuscular septum was released about 10 cm proximally to the elbow. Distally, Osborne's ligament was released from its posterior edge. We followed the ulnar nerve distally. The fascia of the flexor carpi ulnaris was divided and deep aponeurosis was also released. Passive motion of the elbow was performed showing no ulnar nerve subluxation. At this point, local infiltration with 0.25% of Sensorcaine  was given. The tourniquet was deflated. Hemostasis was achieved. The wound was irrigated and closed using 2-0 vicryl and 3-0 nylon sutures. Sterile  dressing applied.  Long arm splint placed with elbow at 90 degrees.  The patient was transferred to the recovery room in stable condition after all counts were correct.  POSTOPERATIVE PLAN: To start nerve gliding exercises, avoid heavy lifting for four weeks.

## 2024-04-26 NOTE — Transfer of Care (Signed)
 Immediate Anesthesia Transfer of Care Note  Patient: Alexandra Norris  Procedure(s) Performed: RELEASE, CUBITAL TUNNEL (Left: Elbow)  Patient Location: PACU  Anesthesia Type:General  Level of Consciousness: awake, alert , and patient cooperative  Airway & Oxygen Therapy: Patient Spontanous Breathing and Patient connected to nasal cannula oxygen  Post-op Assessment: Report given to RN and Post -op Vital signs reviewed and stable  Post vital signs: Reviewed and stable  Last Vitals:  Vitals Value Taken Time  BP 109/59 04/26/24 13:52  Temp    Pulse 74 04/26/24 13:54  Resp 21 04/26/24 13:54  SpO2 98 % 04/26/24 13:54  Vitals shown include unfiled device data.  Last Pain:  Vitals:   04/26/24 1109  TempSrc: Temporal  PainSc: 7       Patients Stated Pain Goal: 6 (04/26/24 1109)  Complications: No notable events documented.

## 2024-04-26 NOTE — Anesthesia Preprocedure Evaluation (Signed)
 Anesthesia Evaluation  Patient identified by MRN, date of birth, ID band Patient awake    Reviewed: Allergy & Precautions, NPO status , Patient's Chart, lab work & pertinent test results  History of Anesthesia Complications (+) PONV and history of anesthetic complications  Airway Mallampati: II  TM Distance: >3 FB Neck ROM: Full    Dental no notable dental hx. (+) Dental Advisory Given, Teeth Intact   Pulmonary asthma , neg sleep apnea, neg COPD, Patient abstained from smoking.Not current smoker   Pulmonary exam normal breath sounds clear to auscultation       Cardiovascular Exercise Tolerance: Good METShypertension, Pt. on medications (-) CAD and (-) Past MI Normal cardiovascular exam(-) dysrhythmias  Rhythm:Regular Rate:Normal - Systolic murmurs    Neuro/Psych  Headaches PSYCHIATRIC DISORDERS Anxiety Depression     Neuromuscular disease    GI/Hepatic Neg liver ROS,GERD  Controlled,,  Endo/Other  diabetes, Well Controlled, Type 2, Insulin  Dependent, Oral Hypoglycemic Agents  Class 3 obesityLast ozempic  use 2 weeks ago. Denies GI symptoms today  Renal/GU negative Renal ROS     Musculoskeletal  (+) Arthritis ,    Abdominal  (+) + obese  Peds  Hematology negative hematology ROS (+)   Anesthesia Other Findings Past Medical History: No date: Anemia No date: Anemia No date: Anxiety No date: Arthritis No date: Asthma 09/25/2015: Bilateral chronic knee pain No date: Carpal tunnel syndrome 04/03/2020: Cervical disc disorder at C5-C6 level with radiculopathy 03/06/2022: Cervical post-laminectomy syndrome 12/02/2021: Cubital tunnel syndrome on right No date: Depression 02/05/2009: DEPRESSION, SITUATIONAL, PROLONGED     Comment:  Qualifier: Diagnosis of  By: Reynaldo  MD, Lucita   No date: Diabetes mellitus without complication (HCC) 04/22/2021: Dizziness No date: Dyspnea 06/17/2020: Elevated blood-pressure reading,  without diagnosis of  hypertension No date: Endometriosis 04/22/2021: Fatigue 1999: History of blood transfusion     Comment:  w/vaginal delivery No date: History of bronchitis     Comment:  I've stayed in hospital 2 X w/bronchitis No date: Hyperlipidemia No date: Hypertension No date: Impingement syndrome of left shoulder 04/03/2020: Influenza vaccine needed 07/11/2019: Left carpal tunnel syndrome No date: Migraine 12/06/2006: Moderate persistent asthma with acute exacerbation     Comment:  Qualifier: Diagnosis of  By: Reynaldo  MD, Lucita   02/18/2016: Nasal obstruction     Comment:  Formatting of this note might be different from the               original.  Added automatically from request for surgery               [id number]   10/14/2020: Neck pain No date: Pneumonia No date: PONV (postoperative nausea and vomiting) 11/14/2020: Post-herpetic polyneuropathy 10/03/2019: Pre-diabetes     Comment:  A1C 6.4 07/11/2019: Right carpal tunnel syndrome 07/08/2021: S/P arthroscopy of right shoulder 08/30/2019: S/P carpal tunnel release 10/08/2016: Seasonal allergies 11/14/2020: Shingles 10/07/2021: Sinusitis, bacterial 01/20/2022: Snoring   Reproductive/Obstetrics                              Anesthesia Physical Anesthesia Plan  ASA: 3  Anesthesia Plan: General   Post-op Pain Management: Minimal or no pain anticipated and Tylenol  PO (pre-op)*   Induction: Intravenous  PONV Risk Score and Plan: 4 or greater and Propofol  infusion, Treatment may vary due to age or medical condition, TIVA, Dexamethasone , Ondansetron  and Midazolam   Airway Management Planned: Natural Airway, Simple Face Mask and LMA  Additional Equipment:  None  Intra-op Plan:   Post-operative Plan: Extubation in OR  Informed Consent: I have reviewed the patients History and Physical, chart, labs and discussed the procedure including the risks, benefits and alternatives for the proposed  anesthesia with the patient or authorized representative who has indicated his/her understanding and acceptance.     Interpreter used for interview (live spanish interpreter at bedside)  Plan Discussed with: CRNA and Anesthesiologist  Anesthesia Plan Comments: (Discussed risks of anesthesia with patient, including PONV, sore throat, lip/dental/eye damage. Rare risks discussed as well, such as cardiorespiratory and neurological sequelae, and allergic reactions. Discussed the role of CRNA in patient's perioperative care. Patient understands.)        Anesthesia Quick Evaluation

## 2024-04-26 NOTE — H&P (Signed)
 PREOPERATIVE H&P  Chief Complaint: left cubital tunnel syndrome  HPI: Alexandra Norris is a 48 y.o. female who presents for surgical treatment of left cubital tunnel syndrome.  She denies any changes in medical history.  Past Surgical History:  Procedure Laterality Date   ABDOMINAL HYSTERECTOMY  10/17/2019   APPENDECTOMY     CARPAL TUNNEL RELEASE Right 07/19/2019   Procedure: RIGHT CARPAL TUNNEL RELEASE;  Surgeon: Jerri Kay HERO, MD;  Location: [facility];  Service: Orthopedics;  Laterality: Right;   CARPAL TUNNEL RELEASE Left 09/13/2019   Procedure: LEFT CARPAL TUNNEL RELEASE;  Surgeon: Jerri Kay HERO, MD;  Location: [facility];  Service: Orthopedics;  Laterality: Left;  Bier block   CESAREAN SECTION  1997; 2008   CHOLECYSTECTOMY  2011   COLONOSCOPY WITH PROPOFOL  N/A 05/25/2023   Procedure: COLONOSCOPY WITH PROPOFOL ;  Surgeon: Leigh Elspeth SQUIBB, MD;  Location: [facility];  Service: Gastroenterology;  Laterality: N/A;   POLYPECTOMY  05/25/2023   Procedure: POLYPECTOMY;  Surgeon: Leigh Elspeth SQUIBB, MD;  Location: [facility];  Service: Gastroenterology;;   SHOULDER ARTHROSCOPY WITH DISTAL CLAVICLE RESECTION  02/12/2021   Procedure: SHOULDER ARTHROSCOPY WITH DISTAL CLAVICLE RESECTION;  Surgeon: Jerri Kay HERO, MD;  Location: [facility];  Service: Orthopedics;;   SHOULDER ARTHROSCOPY WITH ROTATOR CUFF REPAIR AND SUBACROMIAL DECOMPRESSION Left 02/12/2021   Procedure: LEFT SHOULDER ARTHROSCOPY WITH ROTATOR CUFF REPAIR, SUBACROMIAL DECOMPRESSION, EXTENSIVE DEBRIDEMENT;  Surgeon: Jerri Kay HERO, MD;  Location: [facility];  Service: Orthopedics;  Laterality: Left;   TONSILLECTOMY     tonsilletomy     TUBAL LIGATION  04/2007   ULNAR TUNNEL RELEASE Right 12/17/2021   Procedure: RIGHT CUBITAL TUNNEL RELEASE;  Surgeon: Jerri Kay HERO, MD;  Location: [facility];  Service: Orthopedics;  Laterality: Right;    VAGINAL HYSTERECTOMY Bilateral 10/17/2019   Procedure: HYSTERECTOMY VAGINAL WITH SALPINGECTOMY;  Surgeon: Lorence Ozell CROME, MD;  Location: [facility] OR;  Service: Gynecology;  Laterality: Bilateral;   Social History   Socioeconomic History   Marital status: Married    Spouse name: Not on file   Number of children: 3   Years of education: Not on file   Highest education level: 12th grade  Occupational History   Not on file  Tobacco Use   Smoking status: Never   Smokeless tobacco: Never  Vaping Use   Vaping status: Never Used  Substance and Sexual Activity   Alcohol use: No   Drug use: No   Sexual activity: Yes    Birth control/protection: Surgical    Comment: Hysterectomy  Other Topics Concern   Not on file  Social History Narrative   Pt was born in [place]   Caffeine  none.     Education: 6 th grade.    Work:  remote    Social Drivers of Corporate Investment Banker Strain: Medium Risk (02/04/2023)   Overall Financial Resource Strain (CARDIA)    Difficulty of Paying Living Expenses: Somewhat hard  Food Insecurity: No Food Insecurity (07/22/2023)   Hunger Vital Sign    Worried About Running Out of Food in the Last Year: Never true    Ran Out of Food in the Last Year: Never true  Transportation Needs: No Transportation Needs (07/22/2023)   PRAPARE - Administrator, Civil Service (Medical): No    Lack of Transportation (Non-Medical): No  Physical Activity: Insufficiently Active (02/04/2023)   Exercise Vital Sign  Days of Exercise per Week: 3 days    Minutes of Exercise per Session: 30 min  Stress: Stress Concern Present (02/04/2023)   [employer] of Occupational Health - Occupational Stress Questionnaire    Feeling of Stress : Rather much  Social Connections: Moderately Integrated (02/04/2023)   Social Connection and Isolation Panel    Frequency of Communication with Friends and Family: More than three times a week    Frequency of Social Gatherings with Friends  and Family: Once a week    Attends Religious Services: 1 to 4 times per year    Active Member of [employer] or Organizations: No    Attends Engineer, Structural: Never    Marital Status: Married   Family History  Problem Relation Age of Onset   Diabetes Mother    Osteoarthritis Mother    Hypertension Mother    Hyperlipidemia Mother    Arthritis Mother    Colon cancer Mother    Colon polyps Neg Hx    Esophageal cancer Neg Hx    Stomach cancer Neg Hx    Ulcerative colitis Neg Hx    Breast cancer Neg Hx    No Known Allergies Prior to Admission medications   Medication Sig Start Date End Date Taking? Authorizing Provider  albuterol  (VENTOLIN  HFA) 108 (90 Base) MCG/ACT inhaler Inhale 2 puffs into the lungs every 6 (six) hours as needed for wheezing or shortness of breath. 04/11/24  Yes Arlyss Leita BRAVO, PA-C  insulin  degludec (TRESIBA  FLEXTOUCH) 100 UNIT/ML FlexTouch Pen Inject 20 Units into the skin at bedtime. 04/06/24  Yes Caudle, Thersia Bitters, FNP  metFORMIN  (GLUCOPHAGE ) 500 MG tablet Take 1 tablet (500 mg total) by mouth 2 (two) times daily with a meal. 04/03/24  Yes Caudle, Thersia Bitters, FNP  Multiple Vitamin (MULTIVITAMIN WITH MINERALS) TABS tablet Take 1 tablet by mouth daily in the afternoon.   Yes [provider]  SUMAtriptan  (IMITREX ) 50 MG tablet May repeat dose (1 tablet) in 2 hours if headache persists or recurs. Do not exceed 200mg  in 24 hours. 04/03/24  Yes Caudle, Thersia Bitters, FNP  azelastine (ASTELIN) 0.1 % nasal spray Place 2 sprays into both nostrils 2 (two) times daily. Use in each nostril as directed 04/11/24   Arlyss Leita BRAVO, PA-C  Continuous Glucose Sensor (FREESTYLE LIBRE 14 DAY SENSOR) MISC Apply upper deltoid every 14 days, use reader to determine blood sugars. 02/01/24   Knute Thersia Bitters, FNP  Glucose Blood (BLOOD GLUCOSE TEST STRIPS) STRP Use to check blood sugar twice daily. May substitute to any manufacturer covered by patient's insurance.  Patient doesn't know name of glucometer 11/29/23   Caudle, Thersia Bitters, FNP  HYDROcodone -acetaminophen  (NORCO/VICODIN) 5-325 MG tablet Take 1 tablet by mouth 3 (three) times daily as needed. To be taken after surgery 04/13/24   Jule Ronal CROME, PA-C  Insulin  Pen Needle (1ST TIER UNIFINE PENTIPS) 32G X 4 MM MISC Use as directed with Tresiba  Flextouch pen 05/26/23   Towana Small, FNP  Lancets Misc. MISC Use to check blood sugar twice daily. May substitute to any manufacturer covered by patient's insurance. Patient does not know name of device. 11/29/23   Caudle, Thersia Bitters, FNP  ondansetron  (ZOFRAN ) 4 MG tablet Take 1 tablet (4 mg total) by mouth every 8 (eight) hours as needed for nausea or vomiting. 04/13/24   Jule Ronal CROME, PA-C  Semaglutide ,0.25 or 0.5MG /DOS, (OZEMPIC , 0.25 OR 0.5 MG/DOSE,) 2 MG/3ML SOPN Inject 0.5 mg into the skin once a week.  04/03/24   Caudle, Thersia Bitters, FNP  atorvastatin  (LIPITOR) 20 MG tablet Take 1 tablet (20 mg total) by mouth at bedtime. 03/04/23 10/13/23  Towana Small, FNP     Positive ROS: All other systems have been reviewed and were otherwise negative with the exception of those mentioned in the HPI and as above.  Physical Exam: General: Alert, no acute distress Cardiovascular: No pedal edema Respiratory: No cyanosis, no use of accessory musculature GI: abdomen soft Skin: No lesions in the area of chief complaint Neurologic: Sensation intact distally Psychiatric: Patient is competent for consent with normal mood and affect Lymphatic: no lymphedema  MUSCULOSKELETAL: exam stable  Assessment: left cubital tunnel syndrome  Plan: Plan for Procedure(s): RELEASE, CUBITAL TUNNEL  The risks benefits and alternatives were discussed with the patient including but not limited to the risks of nonoperative treatment, versus surgical intervention including infection, bleeding, nerve injury,  blood clots, cardiopulmonary complications, morbidity, mortality,  among others, and they were willing to proceed.   Ozell Cummins, MD 04/26/2024 11:54 AM

## 2024-04-26 NOTE — Anesthesia Procedure Notes (Signed)
 Procedure Name: LMA Insertion Date/Time: 04/26/2024 12:52 PM  Performed by: Donnell Berwyn SQUIBB, CRNAPre-anesthesia Checklist: Patient identified, Emergency Drugs available, Suction available, Patient being monitored and Timeout performed Patient Re-evaluated:Patient Re-evaluated prior to induction Oxygen Delivery Method: Circle system utilized Preoxygenation: Pre-oxygenation with 100% oxygen Induction Type: IV induction LMA: LMA inserted LMA Size: 4.0 Number of attempts: 1 Placement Confirmation: positive ETCO2 and breath sounds checked- equal and bilateral Tube secured with: Tape Dental Injury: Teeth and Oropharynx as per pre-operative assessment

## 2024-04-27 ENCOUNTER — Encounter (HOSPITAL_BASED_OUTPATIENT_CLINIC_OR_DEPARTMENT_OTHER): Payer: Self-pay | Admitting: Orthopaedic Surgery

## 2024-05-03 ENCOUNTER — Telehealth: Payer: Self-pay | Admitting: Orthopaedic Surgery

## 2024-05-03 ENCOUNTER — Other Ambulatory Visit: Payer: Self-pay | Admitting: Physician Assistant

## 2024-05-03 NOTE — Telephone Encounter (Signed)
 Pt's husband called saying that the medication that his wife was prescribed isn't helping with the pain and is wondering if there is something else tha can be prescribed. Pharmacy is [facility]. Call back number is [phone]

## 2024-05-03 NOTE — Telephone Encounter (Signed)
 She should take ibuprofen  800 mg every 8 hours.  In between, she should take tylenol  1000 mg every 6 hours.

## 2024-05-10 ENCOUNTER — Ambulatory Visit: Payer: PRIVATE HEALTH INSURANCE | Admitting: Physician Assistant

## 2024-05-10 DIAGNOSIS — G5622 Lesion of ulnar nerve, left upper limb: Secondary | ICD-10-CM

## 2024-05-10 DIAGNOSIS — M542 Cervicalgia: Secondary | ICD-10-CM

## 2024-05-10 NOTE — Addendum Note (Signed)
 Addended by: Cloie Wooden on: 05/10/2024 11:31 AM   Modules accepted: Orders

## 2024-05-10 NOTE — Progress Notes (Signed)
 Post-Op Visit Note   Patient: Alexandra Norris           Date of Birth: 09/14/1975           MRN: [id number] Visit Date: 05/10/2024 PCP: Knute Thersia Bitters, FNP   Assessment & Plan:  Chief Complaint:  Chief Complaint  Patient presents with   Left Elbow - Follow-up    Cubital tunnel release 04/26/2024   Visit Diagnoses:  1. Cubital tunnel syndrome on left     Plan: patient comes in two weeks s/p left cubital tunnel release.  She has been doing well and only taking tylenol  for pain.  She denies any paresthesias.  Left elbow exam: well healing incision with nylon sutures in place. No evidence of infection or cellulitis.  Fingers are warm and well perfused.  Today, sutures were removed and steri strips applied.  May begin ROM exercises.  No heavy lifting for two more weeks.  Follow up in two weeks for recheck.    Follow-Up Instructions: Return in about 2 weeks (around 05/24/2024).   Orders:  No orders of the defined types were placed in this encounter.  No orders of the defined types were placed in this encounter.   Imaging: No new imaging  PMFS History: Patient Active Problem List   Diagnosis Date Noted   Cubital tunnel syndrome on left 02/22/2024   Chronic right shoulder pain 02/22/2024   Family history of colon cancer 05/25/2023   Benign neoplasm of colon 05/25/2023   Acute intractable headache 03/31/2023   Type 2 diabetes mellitus with hyperglycemia, with long-term current use of insulin  (HCC) 07/28/2022   Asthma due to seasonal allergies 09/25/2021   Arthritis of right acromioclavicular joint 04/24/2021   Chronic left shoulder pain 04/22/2021   Hypertension associated with diabetes (HCC) 04/22/2021   Hyperlipidemia associated with type 2 diabetes mellitus (HCC) 04/22/2021   Fatty liver disease, nonalcoholic 09/09/2020   Vitamin D  deficiency 09/25/2015   Anxiety and depression 07/06/2014   GASTROESOPHAGEAL REFLUX DISEASE 08/14/2009   Past Medical  History:  Diagnosis Date   Anemia    Anemia    Anxiety    Arthritis    Asthma    Bilateral chronic knee pain 09/25/2015   Carpal tunnel syndrome    Cervical disc disorder at C5-C6 level with radiculopathy 04/03/2020   Cervical post-laminectomy syndrome 03/06/2022   Cubital tunnel syndrome on right 12/02/2021   Depression    DEPRESSION, SITUATIONAL, PROLONGED 02/05/2009   Qualifier: Diagnosis of  By: Reynaldo  MD, Khary     Diabetes mellitus without complication (HCC)    Dizziness 04/22/2021   Dyspnea    Elevated blood-pressure reading, without diagnosis of hypertension 06/17/2020   Endometriosis    Fatigue 04/22/2021   History of blood transfusion 1999   w/vaginal delivery   History of bronchitis    I've stayed in hospital 2 X w/bronchitis   Hyperlipidemia    Hypertension    Impingement syndrome of left shoulder    Influenza vaccine needed 04/03/2020   Left carpal tunnel syndrome 07/11/2019   Migraine    Moderate persistent asthma with acute exacerbation 12/06/2006   Qualifier: Diagnosis of  By: Reynaldo  MD, Khary     Nasal obstruction 02/18/2016   Formatting of this note might be different from the original.  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  Pre-diabetes 10/03/2019   A1C 6.4   Right carpal tunnel syndrome 07/11/2019   S/P arthroscopy of right shoulder 07/08/2021   S/P carpal tunnel release 08/30/2019   Seasonal allergies 10/08/2016   Shingles 11/14/2020   Sinusitis, bacterial 10/07/2021   Snoring 01/20/2022    Family History  Problem Relation Age of Onset   Diabetes Mother    Osteoarthritis Mother    Hypertension Mother    Hyperlipidemia Mother    Arthritis Mother    Colon cancer Mother    Colon polyps Neg Hx    Esophageal cancer Neg Hx    Stomach cancer Neg Hx    Ulcerative colitis Neg Hx    Breast cancer Neg Hx      Past Surgical History:  Procedure Laterality Date   ABDOMINAL HYSTERECTOMY  10/17/2019   APPENDECTOMY     CARPAL TUNNEL RELEASE Right 07/19/2019   Procedure: RIGHT CARPAL TUNNEL RELEASE;  Surgeon: Jerri Kay HERO, MD;  Location: [facility];  Service: Orthopedics;  Laterality: Right;   CARPAL TUNNEL RELEASE Left 09/13/2019   Procedure: LEFT CARPAL TUNNEL RELEASE;  Surgeon: Jerri Kay HERO, MD;  Location: [facility];  Service: Orthopedics;  Laterality: Left;  Bier block   CESAREAN SECTION  1997; 2008   CHOLECYSTECTOMY  2011   COLONOSCOPY WITH PROPOFOL  N/A 05/25/2023   Procedure: COLONOSCOPY WITH PROPOFOL ;  Surgeon: Leigh Elspeth SQUIBB, MD;  Location: [facility];  Service: Gastroenterology;  Laterality: N/A;   POLYPECTOMY  05/25/2023   Procedure: POLYPECTOMY;  Surgeon: Leigh Elspeth SQUIBB, MD;  Location: [facility];  Service: Gastroenterology;;   SHOULDER ARTHROSCOPY WITH DISTAL CLAVICLE RESECTION  02/12/2021   Procedure: SHOULDER ARTHROSCOPY WITH DISTAL CLAVICLE RESECTION;  Surgeon: Jerri Kay HERO, MD;  Location: [facility];  Service: Orthopedics;;   SHOULDER ARTHROSCOPY WITH ROTATOR CUFF REPAIR AND SUBACROMIAL DECOMPRESSION Left 02/12/2021   Procedure: LEFT SHOULDER ARTHROSCOPY WITH ROTATOR CUFF REPAIR, SUBACROMIAL DECOMPRESSION, EXTENSIVE DEBRIDEMENT;  Surgeon: Jerri Kay HERO, MD;  Location: [facility];  Service: Orthopedics;  Laterality: Left;   TONSILLECTOMY     tonsilletomy     TUBAL LIGATION  04/2007   ULNAR TUNNEL RELEASE Right 12/17/2021   Procedure: RIGHT CUBITAL TUNNEL RELEASE;  Surgeon: Jerri Kay HERO, MD;  Location: [facility];  Service: Orthopedics;  Laterality: Right;   ULNAR TUNNEL RELEASE Left 04/26/2024   Procedure: RELEASE, CUBITAL TUNNEL;  Surgeon: Jerri Kay HERO, MD;  Location: [facility];  Service: Orthopedics;  Laterality: Left;   VAGINAL HYSTERECTOMY Bilateral 10/17/2019   Procedure:  HYSTERECTOMY VAGINAL WITH SALPINGECTOMY;  Surgeon: Lorence Ozell CROME, MD;  Location: [facility];  Service: Gynecology;  Laterality: Bilateral;   Social History   Occupational History   Not on file  Tobacco Use   Smoking status: Never   Smokeless tobacco: Never  Vaping Use   Vaping status: Never Used  Substance and Sexual Activity   Alcohol use: No   Drug use: No   Sexual activity: Yes    Birth control/protection: Surgical    Comment: Hysterectomy

## 2024-05-22 ENCOUNTER — Other Ambulatory Visit: Payer: Self-pay

## 2024-05-25 ENCOUNTER — Ambulatory Visit: Payer: PRIVATE HEALTH INSURANCE | Admitting: Physician Assistant

## 2024-05-25 DIAGNOSIS — G5622 Lesion of ulnar nerve, left upper limb: Secondary | ICD-10-CM

## 2024-05-25 NOTE — Progress Notes (Signed)
 Post-Op Visit Note   Patient: Alexandra Norris           Date of Birth: [birth year]           MRN: [id number] Visit Date: 05/25/2024 PCP: Knute Thersia Bitters, FNP   Assessment & Plan:  Chief Complaint:  Chief Complaint  Patient presents with   Left Elbow - Follow-up    Cubital tunnel release 04/26/2024   Visit Diagnoses:  1. Cubital tunnel syndrome on left     Plan: Patient is a pleasant 48 year old Spanish-speaking female here today 4 weeks status post left cubital tunnel release 04/26/2024.  She has been doing well.  She notes some tenderness to the touch but nothing else.  No paresthesias.  Examination of the left elbow reveals fully healed surgical scar without complication.  Full range of motion of her elbow without pain.  She is neurovascular intact distally.  At this point, she will advance with activity as tolerated.  Follow-up as needed.  Follow-Up Instructions: Return if symptoms worsen or fail to improve.   Orders:  No orders of the defined types were placed in this encounter.  No orders of the defined types were placed in this encounter.   Imaging: No new imaging  PMFS History: Patient Active Problem List   Diagnosis Date Noted   Cubital tunnel syndrome on left 02/22/2024   Chronic right shoulder pain 02/22/2024   Family history of colon cancer 05/25/2023   Benign neoplasm of colon 05/25/2023   Acute intractable headache 03/31/2023   Type 2 diabetes mellitus with hyperglycemia, with long-term current use of insulin  (HCC) 07/28/2022   Asthma due to seasonal allergies 09/25/2021   Arthritis of right acromioclavicular joint 04/24/2021   Chronic left shoulder pain 04/22/2021   Hypertension associated with diabetes (HCC) 04/22/2021   Hyperlipidemia associated with type 2 diabetes mellitus (HCC) 04/22/2021   Fatty liver disease, nonalcoholic 09/09/2020   Vitamin D  deficiency 09/25/2015   Anxiety and depression 07/06/2014   GASTROESOPHAGEAL  REFLUX DISEASE 08/14/2009   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 level with radiculopathy 04/03/2020   Cervical post-laminectomy syndrome 03/06/2022   Cubital tunnel syndrome on right 12/02/2021   Depression    DEPRESSION, SITUATIONAL, PROLONGED 02/05/2009   Qualifier: Diagnosis of  By: Reynaldo  MD, Khary     Diabetes mellitus without complication (HCC)    Dizziness 04/22/2021   Dyspnea    Elevated blood-pressure reading, without diagnosis of hypertension 06/17/2020   Endometriosis    Fatigue 04/22/2021   History of blood transfusion 1999   w/vaginal delivery   History of bronchitis    I've stayed in hospital 2 X w/bronchitis   Hyperlipidemia    Hypertension    Impingement syndrome of left shoulder    Influenza vaccine needed 04/03/2020   Left carpal tunnel syndrome 07/11/2019   Migraine    Moderate persistent asthma with acute exacerbation 12/06/2006   Qualifier: Diagnosis of  By: Reynaldo  MD, Khary     Nasal obstruction 02/18/2016   Formatting of this note might be different from the original.  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  Pre-diabetes 10/03/2019   A1C 6.4   Right carpal tunnel syndrome 07/11/2019   S/P arthroscopy of right shoulder 07/08/2021   S/P carpal tunnel release 08/30/2019   Seasonal allergies 10/08/2016   Shingles 11/14/2020   Sinusitis, bacterial 10/07/2021   Snoring 01/20/2022    Family History  Problem Relation Age of Onset   Diabetes Mother    Osteoarthritis Mother    Hypertension Mother    Hyperlipidemia Mother    Arthritis Mother    Colon cancer Mother    Colon polyps Neg Hx    Esophageal cancer Neg Hx    Stomach cancer Neg Hx    Ulcerative  colitis Neg Hx    Breast cancer Neg Hx     Past Surgical History:  Procedure Laterality Date   ABDOMINAL HYSTERECTOMY  10/17/2019   APPENDECTOMY     CARPAL TUNNEL RELEASE Right 07/19/2019   Procedure: RIGHT CARPAL TUNNEL RELEASE;  Surgeon: Jerri Kay HERO, MD;  Location: [facility];  Service: Orthopedics;  Laterality: Right;   CARPAL TUNNEL RELEASE Left 09/13/2019   Procedure: LEFT CARPAL TUNNEL RELEASE;  Surgeon: Jerri Kay HERO, MD;  Location: [facility];  Service: Orthopedics;  Laterality: Left;  Bier block   CESAREAN SECTION  1997; 2008   CHOLECYSTECTOMY  2011   COLONOSCOPY WITH PROPOFOL  N/A 05/25/2023   Procedure: COLONOSCOPY WITH PROPOFOL ;  Surgeon: Leigh Elspeth SQUIBB, MD;  Location: [facility];  Service: Gastroenterology;  Laterality: N/A;   POLYPECTOMY  05/25/2023   Procedure: POLYPECTOMY;  Surgeon: Leigh Elspeth SQUIBB, MD;  Location: [facility];  Service: Gastroenterology;;   SHOULDER ARTHROSCOPY WITH DISTAL CLAVICLE RESECTION  02/12/2021   Procedure: SHOULDER ARTHROSCOPY WITH DISTAL CLAVICLE RESECTION;  Surgeon: Jerri Kay HERO, MD;  Location: [facility];  Service: Orthopedics;;   SHOULDER ARTHROSCOPY WITH ROTATOR CUFF REPAIR AND SUBACROMIAL DECOMPRESSION Left 02/12/2021   Procedure: LEFT SHOULDER ARTHROSCOPY WITH ROTATOR CUFF REPAIR, SUBACROMIAL DECOMPRESSION, EXTENSIVE DEBRIDEMENT;  Surgeon: Jerri Kay HERO, MD;  Location: [facility];  Service: Orthopedics;  Laterality: Left;   TONSILLECTOMY     tonsilletomy     TUBAL LIGATION  04/2007   ULNAR TUNNEL RELEASE Right 12/17/2021   Procedure: RIGHT CUBITAL TUNNEL RELEASE;  Surgeon: Jerri Kay HERO, MD;  Location: [facility];  Service: Orthopedics;  Laterality: Right;   ULNAR TUNNEL RELEASE Left 04/26/2024   Procedure: RELEASE, CUBITAL TUNNEL;  Surgeon: Jerri Kay HERO, MD;  Location: [facility];  Service: Orthopedics;  Laterality: Left;   VAGINAL  HYSTERECTOMY Bilateral 10/17/2019   Procedure: HYSTERECTOMY VAGINAL WITH SALPINGECTOMY;  Surgeon: Lorence Ozell CROME, MD;  Location: [facility];  Service: Gynecology;  Laterality: Bilateral;   Social History   Occupational History   Not on file  Tobacco Use   Smoking status: Never   Smokeless tobacco: Never  Vaping Use   Vaping status: Never Used  Substance and Sexual Activity   Alcohol use: No   Drug use: No   Sexual activity: Yes    Birth control/protection: Surgical    Comment: Hysterectomy

## 2024-05-30 ENCOUNTER — Encounter: Payer: Self-pay | Admitting: Physical Medicine and Rehabilitation

## 2024-05-30 ENCOUNTER — Ambulatory Visit: Payer: PRIVATE HEALTH INSURANCE | Admitting: Physical Medicine and Rehabilitation

## 2024-05-30 ENCOUNTER — Telehealth: Payer: Self-pay | Admitting: Orthopedic Surgery

## 2024-05-30 DIAGNOSIS — M5412 Radiculopathy, cervical region: Secondary | ICD-10-CM

## 2024-05-30 DIAGNOSIS — M961 Postlaminectomy syndrome, not elsewhere classified: Secondary | ICD-10-CM

## 2024-05-30 DIAGNOSIS — M47812 Spondylosis without myelopathy or radiculopathy, cervical region: Secondary | ICD-10-CM

## 2024-05-30 NOTE — Progress Notes (Signed)
 "  Alexandra Norris - 48 y.o. female MRN [id number]  Date of birth: [birth year]  Office Visit Note: Visit Date: 05/30/2024 PCP: Knute Thersia Bitters, FNP Referred by: Knute Thersia Bitters, FNP  Subjective: Chief Complaint  Patient presents with   Neck - Pain   HPI: Alexandra Norris is a 48 y.o. female who comes in today per the request of Dr. Ozell Cummins for evaluation of chronic, worsening and severe bilateral neck pain radiating to right shoulder down arm to hand. Also reports numbness/tingling to entire arm, ring finger and fifth digit of right hand. Spanish interpreter at bedside. Pain ongoing for several years. She has history of C5-C6 ACDF with Dr. Lanis at [facility] in 2021. States she was relatively pain free for (1) year following surgery, her symptoms gradually returned. She did undergo previous cervical epidural steroid injection post surgery with Dr. Charlie Dolores. She reports good relief of pain with this procedure. She is previous patient of Dr. Ozell Ada, however she has been dismissed from his practice.   Her pain worsens with movement and activity. She describes pain as sharp and stabbing sensation, currently rates as 8 out of 10. Some relief of pain with home exercise regimen, rest and use of medications. History of formal physical therapy with minimal relief of pain. She feel weakness to her right upper extremity has progressed since surgery. Now reports decreased grip strength to right hand, states she often drops objects with this hand. She has history of bilateral carpal tunnel and bilateral cubital tunnel surgeries with Dr. Cummins.   Patient denies recent trauma or falls. No bowel of bladder incontinence.      Review of Systems  Musculoskeletal:  Positive for neck pain.  Neurological:  Positive for tingling, sensory change and weakness.  All other systems reviewed and are negative.  Otherwise per HPI.  Assessment & Plan: Visit Diagnoses:     ICD-10-CM   1. Radiculopathy, cervical region  M54.12     2. Post laminectomy syndrome  M96.1     3. Facet arthropathy, cervical  M47.812        Plan: Findings:  Chronic, worsening and severe bilateral neck pain radiating to right shoulder down arm to hand. Paresthesias to right upper extremity, including ring finger and fifth digit of right hand. Patient continues to have severe pain despite good conservative therapies such as home exercise regimen, rest and use of medications. Patients clinical presentation and exam are consistent with cervical radiculopathy, more of C8 nerve distribution. This could also be more of an ulnar neuropathy. She does have weakness on exam today, no fine motor skill difficulty during our exam, however weakness to right interossei muscles (weakness with adduction), positive Froment's sign on the right. We discussed treatment plan in detail today. Dr. Eldonna and myself agree that she needs to be re-evaluated by Dr. Lanis. I did go ahead and place referral today. I do not think her symptoms are true myelopathy, weakness could be more of an ulnar issue. Patient has no questions at this time.        Meds & Orders: No orders of the defined types were placed in this encounter.  No orders of the defined types were placed in this encounter.   Follow-up: Return if symptoms worsen or fail to improve, for Referral back to Dr. Lanis.   Procedures: No procedures performed      Clinical History: CLINICAL DATA:  Worsening chronic neck pain radiating into the occipital area. Prior cervical spine  surgery.   EXAM: MRI CERVICAL SPINE WITHOUT CONTRAST   TECHNIQUE: Multiplanar, multisequence MR imaging of the cervical spine was performed. No intravenous contrast was administered.   COMPARISON:  Cervical spine MRI 12/29/2020. Cervical myelogram 04/01/2021.   FINDINGS: Alignment: Chronic cervical spine straightening.  No listhesis.   Vertebrae: No fracture,  suspicious marrow lesion, or significant marrow edema. Prior C5-6 ACDF.   Cord: Normal signal.   Posterior Fossa, vertebral arteries, paraspinal tissues: Unremarkable.   Disc levels:   C2-3: Moderate left facet arthrosis results in borderline to mild left neural foraminal stenosis, unchanged. There is an unchanged small central disc protrusion without significant spinal stenosis.   C3-4: A small central disc protrusion and mild to moderate left facet arthrosis result in mild spinal stenosis, slightly indenting the ventral spinal cord, and mild left neural foraminal stenosis, unchanged.   C4-5: A small left paracentral disc protrusion, uncovertebral spurring, and mild left facet arthrosis result in mild spinal stenosis, the left ventral spinal cord, and mild left neural foraminal stenosis, unchanged.   C5-6: ACDF. Patent spinal canal. At most mild residual neural foraminal narrowing due to uncovertebral spurring, better demonstrated on the prior CT myelogram.   C6-7: Mild disc bulging and a small central disc protrusion result in mild spinal stenosis without neural foraminal stenosis, unchanged.   C7-T1: Mild-to-moderate left facet arthrosis without disc herniation or significant stenosis, unchanged.   IMPRESSION: 1. Unchanged cervical disc and facet degeneration resulting in mild multilevel spinal and neural foraminal stenosis as above. 2. Prior C5-6 ACDF without residual spinal stenosis.     Electronically Signed   By: Dasie Hamburg M.D.   On: 01/28/2022 10:42   She reports that she has never smoked. She has never used smokeless tobacco.  Recent Labs    11/10/23 1106 04/03/24 1002  HGBA1C 13.1* 7.6*    Objective:  VS:  HT:    WT:   BMI:     BP:   HR: bpm  TEMP: ( )  RESP:  Physical Exam Vitals and nursing note reviewed.  HENT:     Head: Normocephalic and atraumatic.     Right Ear: External ear normal.     Left Ear: External ear normal.     Nose: Nose  normal.     Mouth/Throat:     Mouth: Mucous membranes are moist.  Eyes:     Extraocular Movements: Extraocular movements intact.  Cardiovascular:     Rate and Rhythm: Normal rate.     Pulses: Normal pulses.  Pulmonary:     Effort: Pulmonary effort is normal.  Abdominal:     General: Abdomen is flat. There is no distension.  Musculoskeletal:        General: Tenderness present.     Cervical back: Tenderness present.     Comments: No discomfort noted with flexion, extension and side-to-side rotation. Decreased grip strength on the right compared to left. 4/5 weakness with elbow flexion and right finger adduction. Shoulder range of motion is full bilaterally without any sign of impingement. There is no atrophy of the hands intrinsically. Decreased sensation of left upper extremity compared to right. Negative Hoffman's sign. Negative Spurling's sign. Positive Froment's sign on the right.     Skin:    General: Skin is warm and dry.     Capillary Refill: Capillary refill takes less than 2 seconds.  Neurological:     Mental Status: She is alert and oriented to person, place, and time.     Motor:  Weakness present.  Psychiatric:        Mood and Affect: Mood normal.        Behavior: Behavior normal.     Ortho Exam  Imaging: No results found.  Past Medical/Family/Surgical/Social History: Medications & Allergies reviewed per EMR, new medications updated. Patient Active Problem List   Diagnosis Date Noted   Cubital tunnel syndrome on left 02/22/2024   Chronic right shoulder pain 02/22/2024   Family history of colon cancer 05/25/2023   Benign neoplasm of colon 05/25/2023   Acute intractable headache 03/31/2023   Type 2 diabetes mellitus with hyperglycemia, with long-term current use of insulin  (HCC) 07/28/2022   Asthma due to seasonal allergies 09/25/2021   Arthritis of right acromioclavicular joint 04/24/2021   Chronic left shoulder pain 04/22/2021   Hypertension associated with  diabetes (HCC) 04/22/2021   Hyperlipidemia associated with type 2 diabetes mellitus (HCC) 04/22/2021   Fatty liver disease, nonalcoholic 09/09/2020   Vitamin D  deficiency 09/25/2015   Anxiety and depression 07/06/2014   GASTROESOPHAGEAL REFLUX DISEASE 08/14/2009   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 level with radiculopathy 04/03/2020   Cervical post-laminectomy syndrome 03/06/2022   Cubital tunnel syndrome on right 12/02/2021   Depression    DEPRESSION, SITUATIONAL, PROLONGED 02/05/2009   Qualifier: Diagnosis of  By: Reynaldo  MD, Khary     Diabetes mellitus without complication (HCC)    Dizziness 04/22/2021   Dyspnea    Elevated blood-pressure reading, without diagnosis of hypertension 06/17/2020   Endometriosis    Fatigue 04/22/2021   History of blood transfusion 1999   w/vaginal delivery   History of bronchitis    I've stayed in hospital 2 X w/bronchitis   Hyperlipidemia    Hypertension    Impingement syndrome of left shoulder    Influenza vaccine needed 04/03/2020   Left carpal tunnel syndrome 07/11/2019   Migraine    Moderate persistent asthma with acute exacerbation 12/06/2006   Qualifier: Diagnosis of  By: Reynaldo  MD, Khary     Nasal obstruction 02/18/2016   Formatting of this note might be different from the original.  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   Pre-diabetes 10/03/2019   A1C 6.4   Right carpal tunnel syndrome 07/11/2019   S/P arthroscopy of right shoulder 07/08/2021   S/P carpal tunnel release 08/30/2019   Seasonal allergies 10/08/2016   Shingles 11/14/2020   Sinusitis, bacterial 10/07/2021   Snoring 01/20/2022   Family History  Problem Relation Age  of Onset   Diabetes Mother    Osteoarthritis Mother    Hypertension Mother    Hyperlipidemia Mother    Arthritis Mother    Colon cancer Mother    Colon polyps Neg Hx    Esophageal cancer Neg Hx    Stomach cancer Neg Hx    Ulcerative colitis Neg Hx    Breast cancer Neg Hx    Past Surgical History:  Procedure Laterality Date   ABDOMINAL HYSTERECTOMY  10/17/2019   APPENDECTOMY     CARPAL TUNNEL RELEASE Right 07/19/2019   Procedure: RIGHT CARPAL TUNNEL RELEASE;  Surgeon: Jerri Kay HERO, MD;  Location: [facility];  Service: Orthopedics;  Laterality: Right;   CARPAL TUNNEL RELEASE Left 09/13/2019   Procedure: LEFT CARPAL TUNNEL RELEASE;  Surgeon: Jerri Kay HERO, MD;  Location: [facility];  Service: Orthopedics;  Laterality: Left;  Bier block   CESAREAN SECTION  1997; 2008   CHOLECYSTECTOMY  2011   COLONOSCOPY WITH PROPOFOL  N/A 05/25/2023   Procedure: COLONOSCOPY WITH PROPOFOL ;  Surgeon: Leigh Elspeth SQUIBB, MD;  Location: [facility];  Service: Gastroenterology;  Laterality: N/A;   POLYPECTOMY  05/25/2023   Procedure: POLYPECTOMY;  Surgeon: Leigh Elspeth SQUIBB, MD;  Location: [facility];  Service: Gastroenterology;;   SHOULDER ARTHROSCOPY WITH DISTAL CLAVICLE RESECTION  02/12/2021   Procedure: SHOULDER ARTHROSCOPY WITH DISTAL CLAVICLE RESECTION;  Surgeon: Jerri Kay HERO, MD;  Location: [facility];  Service: Orthopedics;;   SHOULDER ARTHROSCOPY WITH ROTATOR CUFF REPAIR AND SUBACROMIAL DECOMPRESSION Left 02/12/2021   Procedure: LEFT SHOULDER ARTHROSCOPY WITH ROTATOR CUFF REPAIR, SUBACROMIAL DECOMPRESSION, EXTENSIVE DEBRIDEMENT;  Surgeon: Jerri Kay HERO, MD;  Location: [facility];  Service: Orthopedics;  Laterality: Left;   TONSILLECTOMY     tonsilletomy     TUBAL LIGATION  04/2007   ULNAR TUNNEL RELEASE Right 12/17/2021   Procedure: RIGHT CUBITAL TUNNEL RELEASE;  Surgeon: Jerri Kay HERO, MD;  Location: [facility];  Service:  Orthopedics;  Laterality: Right;   ULNAR TUNNEL RELEASE Left 04/26/2024   Procedure: RELEASE, CUBITAL TUNNEL;  Surgeon: Jerri Kay HERO, MD;  Location: [facility];  Service: Orthopedics;  Laterality: Left;   VAGINAL HYSTERECTOMY Bilateral 10/17/2019   Procedure: HYSTERECTOMY VAGINAL WITH SALPINGECTOMY;  Surgeon: Lorence Ozell CROME, MD;  Location: [facility];  Service: Gynecology;  Laterality: Bilateral;   Social History   Occupational History   Not on file  Tobacco Use   Smoking status: Never   Smokeless tobacco: Never  Vaping Use   Vaping status: Never Used  Substance and Sexual Activity   Alcohol use: No   Drug use: No   Sexual activity: Yes    Birth control/protection: Surgical    Comment: Hysterectomy   "

## 2024-05-30 NOTE — Progress Notes (Signed)
 Pain Scale   Average Pain 5 Patient advising she has chronic neck pain radiating to right side        +Driver, -BT, -Dye Allergies.

## 2024-05-30 NOTE — Telephone Encounter (Signed)
 Discharge letter from Dr. Georgina mailed 04/07/24 returned-non deliverable as addressed unable to forward

## 2024-06-28 ENCOUNTER — Ambulatory Visit: Payer: PRIVATE HEALTH INSURANCE | Admitting: Orthopaedic Surgery

## 2024-07-05 ENCOUNTER — Ambulatory Visit: Payer: PRIVATE HEALTH INSURANCE | Admitting: Orthopaedic Surgery

## 2024-07-05 ENCOUNTER — Other Ambulatory Visit (HOSPITAL_BASED_OUTPATIENT_CLINIC_OR_DEPARTMENT_OTHER): Payer: Self-pay

## 2024-07-05 DIAGNOSIS — G5622 Lesion of ulnar nerve, left upper limb: Secondary | ICD-10-CM | POA: Diagnosis not present

## 2024-07-05 MED ORDER — NAPROXEN 500 MG PO TABS
500.0000 mg | ORAL_TABLET | Freq: Two times a day (BID) | ORAL | 3 refills | Status: AC
  Filled 2024-07-05: qty 30, 15d supply, fill #0

## 2024-07-05 MED ORDER — GABAPENTIN 100 MG PO CAPS
100.0000 mg | ORAL_CAPSULE | Freq: Every day | ORAL | 3 refills | Status: AC
  Filled 2024-07-05: qty 30, 10d supply, fill #0

## 2024-07-05 NOTE — Progress Notes (Signed)
 "  Office Visit Note   Patient: Alexandra Norris           Date of Birth: 08/08/1975           MRN: [id number] Visit Date: 07/05/2024              Requested by: Knute Thersia Bitters, FNP [address] [place],  [place] [address] PCP: Knute Thersia Bitters, FNP   Assessment & Plan: Visit Diagnoses:  1. Cubital tunnel syndrome on left     Plan: Impression is 3 months status post left cubital tunnel release with recent onset of symptoms.  Denies any injuries.  Sounds like she has some inflammation around the surgical site.  Does not appear to be infected.  Maybe some neuritis.  No ulnar nerve motor symptoms.  I'm going to put her on gabapentin  and naproxen  for this.  Follow up as needed.  Follow-Up Instructions: Return if symptoms worsen or fail to improve.   Orders:  No orders of the defined types were placed in this encounter.  Meds ordered this encounter  Medications   naproxen  (NAPROSYN ) 500 MG tablet    Sig: Take 1 tablet (500 mg total) by mouth 2 (two) times daily with a meal.    Dispense:  30 tablet    Refill:  3   gabapentin  (NEURONTIN ) 100 MG capsule    Sig: Take 1-3 capsules (100-300 mg total) by mouth at bedtime.    Dispense:  30 capsule    Refill:  3      Procedures: No procedures performed   Clinical Data: No additional findings.   Subjective: Chief Complaint  Patient presents with   Left Elbow - Pain    Cubital tunnel release-04/26/2024    HPI Patient is a 49 year old female 3 months status post left cubital tunnel release.  Reports that in the last 3 weeks she has felt sensitivity around the surgical scar.  Denies any injuries.  She feels stiffness with elbow flexion.  Denies any numbness and tingling in the ulnar nerve distribution Review of Systems  Constitutional: Negative.   HENT: Negative.    Eyes: Negative.   Respiratory: Negative.    Cardiovascular: Negative.   Endocrine: Negative.   Musculoskeletal:  Negative.   Neurological: Negative.   Hematological: Negative.   Psychiatric/Behavioral: Negative.    All other systems reviewed and are negative.    Objective: Vital Signs: LMP 09/20/2019   Physical Exam Vitals and nursing note reviewed.  Constitutional:      Appearance: She is well-developed.  HENT:     Head: Atraumatic.     Nose: Nose normal.  Eyes:     Extraocular Movements: Extraocular movements intact.  Cardiovascular:     Pulses: Normal pulses.  Pulmonary:     Effort: Pulmonary effort is normal.  Abdominal:     Palpations: Abdomen is soft.  Musculoskeletal:     Cervical back: Neck supple.  Skin:    General: Skin is warm.     Capillary Refill: Capillary refill takes less than 2 seconds.  Neurological:     Mental Status: She is alert. Mental status is at baseline.  Psychiatric:        Behavior: Behavior normal.        Thought Content: Thought content normal.        Judgment: Judgment normal.     Ortho Exam Examination shows fully healed surgical scar.  She has some slight decreased elbow flexion.  She has no ulnar  nerve symptoms.  Full elbow extension.  Neurovascular intact.  Specialty Comments:  CLINICAL DATA:  Worsening chronic neck pain radiating into the occipital area. Prior cervical spine surgery.   EXAM: MRI CERVICAL SPINE WITHOUT CONTRAST   TECHNIQUE: Multiplanar, multisequence MR imaging of the cervical spine was performed. No intravenous contrast was administered.   COMPARISON:  Cervical spine MRI 12/29/2020. Cervical myelogram 04/01/2021.   FINDINGS: Alignment: Chronic cervical spine straightening.  No listhesis.   Vertebrae: No fracture, suspicious marrow lesion, or significant marrow edema. Prior C5-6 ACDF.   Cord: Normal signal.   Posterior Fossa, vertebral arteries, paraspinal tissues: Unremarkable.   Disc levels:   C2-3: Moderate left facet arthrosis results in borderline to mild left neural foraminal stenosis, unchanged.  There is an unchanged small central disc protrusion without significant spinal stenosis.   C3-4: A small central disc protrusion and mild to moderate left facet arthrosis result in mild spinal stenosis, slightly indenting the ventral spinal cord, and mild left neural foraminal stenosis, unchanged.   C4-5: A small left paracentral disc protrusion, uncovertebral spurring, and mild left facet arthrosis result in mild spinal stenosis, the left ventral spinal cord, and mild left neural foraminal stenosis, unchanged.   C5-6: ACDF. Patent spinal canal. At most mild residual neural foraminal narrowing due to uncovertebral spurring, better demonstrated on the prior CT myelogram.   C6-7: Mild disc bulging and a small central disc protrusion result in mild spinal stenosis without neural foraminal stenosis, unchanged.   C7-T1: Mild-to-moderate left facet arthrosis without disc herniation or significant stenosis, unchanged.   IMPRESSION: 1. Unchanged cervical disc and facet degeneration resulting in mild multilevel spinal and neural foraminal stenosis as above. 2. Prior C5-6 ACDF without residual spinal stenosis.     Electronically Signed   By: Dasie Hamburg M.D.   On: 01/28/2022 10:42  Imaging: No results found.   PMFS History: Patient Active Problem List   Diagnosis Date Noted   Cubital tunnel syndrome on left 02/22/2024   Chronic right shoulder pain 02/22/2024   Family history of colon cancer 05/25/2023   Benign neoplasm of colon 05/25/2023   Acute intractable headache 03/31/2023   Type 2 diabetes mellitus with hyperglycemia, with long-term current use of insulin  (HCC) 07/28/2022   Asthma due to seasonal allergies 09/25/2021   Arthritis of right acromioclavicular joint 04/24/2021   Chronic left shoulder pain 04/22/2021   Hypertension associated with diabetes (HCC) 04/22/2021   Hyperlipidemia associated with type 2 diabetes mellitus (HCC) 04/22/2021   Fatty liver disease,  nonalcoholic 09/09/2020   Vitamin D  deficiency 09/25/2015   Anxiety and depression 07/06/2014   GASTROESOPHAGEAL REFLUX DISEASE 08/14/2009   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 level with radiculopathy 04/03/2020   Cervical post-laminectomy syndrome 03/06/2022   Cubital tunnel syndrome on right 12/02/2021   Depression    DEPRESSION, SITUATIONAL, PROLONGED 02/05/2009   Qualifier: Diagnosis of  By: Reynaldo  MD, Khary     Diabetes mellitus without complication (HCC)    Dizziness 04/22/2021   Dyspnea    Elevated blood-pressure reading, without diagnosis of hypertension 06/17/2020   Endometriosis    Fatigue 04/22/2021   History of blood transfusion 1999   w/vaginal delivery   History of bronchitis    I've stayed in hospital 2 X  w/bronchitis   Hyperlipidemia    Hypertension    Impingement syndrome of left shoulder    Influenza vaccine needed 04/03/2020   Left carpal tunnel syndrome 07/11/2019   Migraine    Moderate persistent asthma with acute exacerbation 12/06/2006   Qualifier: Diagnosis of  By: Reynaldo  MD, Lucita     Nasal obstruction 02/18/2016   Formatting of this note might be different from the original.  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   Pre-diabetes 10/03/2019   A1C 6.4   Right carpal tunnel syndrome 07/11/2019   S/P arthroscopy of right shoulder 07/08/2021   S/P carpal tunnel release 08/30/2019   Seasonal allergies 10/08/2016   Shingles 11/14/2020   Sinusitis, bacterial 10/07/2021   Snoring 01/20/2022    Family History  Problem Relation Age of Onset   Diabetes Mother    Osteoarthritis Mother    Hypertension Mother    Hyperlipidemia Mother    Arthritis Mother     Colon cancer Mother    Colon polyps Neg Hx    Esophageal cancer Neg Hx    Stomach cancer Neg Hx    Ulcerative colitis Neg Hx    Breast cancer Neg Hx     Past Surgical History:  Procedure Laterality Date   ABDOMINAL HYSTERECTOMY  10/17/2019   APPENDECTOMY     CARPAL TUNNEL RELEASE Right 07/19/2019   Procedure: RIGHT CARPAL TUNNEL RELEASE;  Surgeon: Jerri Kay HERO, MD;  Location: [facility];  Service: Orthopedics;  Laterality: Right;   CARPAL TUNNEL RELEASE Left 09/13/2019   Procedure: LEFT CARPAL TUNNEL RELEASE;  Surgeon: Jerri Kay HERO, MD;  Location: [facility];  Service: Orthopedics;  Laterality: Left;  Bier block   CESAREAN SECTION  1997; 2008   CHOLECYSTECTOMY  2011   COLONOSCOPY WITH PROPOFOL  N/A 05/25/2023   Procedure: COLONOSCOPY WITH PROPOFOL ;  Surgeon: Leigh Elspeth SQUIBB, MD;  Location: [facility];  Service: Gastroenterology;  Laterality: N/A;   POLYPECTOMY  05/25/2023   Procedure: POLYPECTOMY;  Surgeon: Leigh Elspeth SQUIBB, MD;  Location: [facility];  Service: Gastroenterology;;   SHOULDER ARTHROSCOPY WITH DISTAL CLAVICLE RESECTION  02/12/2021   Procedure: SHOULDER ARTHROSCOPY WITH DISTAL CLAVICLE RESECTION;  Surgeon: Jerri Kay HERO, MD;  Location: [facility];  Service: Orthopedics;;   SHOULDER ARTHROSCOPY WITH ROTATOR CUFF REPAIR AND SUBACROMIAL DECOMPRESSION Left 02/12/2021   Procedure: LEFT SHOULDER ARTHROSCOPY WITH ROTATOR CUFF REPAIR, SUBACROMIAL DECOMPRESSION, EXTENSIVE DEBRIDEMENT;  Surgeon: Jerri Kay HERO, MD;  Location: [facility];  Service: Orthopedics;  Laterality: Left;   TONSILLECTOMY     tonsilletomy     TUBAL LIGATION  04/2007   ULNAR TUNNEL RELEASE Right 12/17/2021   Procedure: RIGHT CUBITAL TUNNEL RELEASE;  Surgeon: Jerri Kay HERO, MD;  Location: [facility];  Service: Orthopedics;  Laterality: Right;   ULNAR TUNNEL RELEASE Left 04/26/2024   Procedure: RELEASE, CUBITAL TUNNEL;  Surgeon:  Jerri Kay HERO, MD;  Location: [facility];  Service: Orthopedics;  Laterality: Left;   VAGINAL HYSTERECTOMY Bilateral 10/17/2019   Procedure: HYSTERECTOMY VAGINAL WITH SALPINGECTOMY;  Surgeon: Lorence Ozell CROME, MD;  Location: [facility];  Service: Gynecology;  Laterality: Bilateral;   Social History   Occupational History   Not on file  Tobacco Use   Smoking status: Never   Smokeless tobacco: Never  Vaping Use  Vaping status: Never Used  Substance and Sexual Activity   Alcohol use: No   Drug use: No   Sexual activity: Yes    Birth control/protection: Surgical    Comment: Hysterectomy        "
